# Patient Record
Sex: Female | Born: 1945
Health system: Southern US, Community
[De-identification: ages and names within clinical notes are randomized; demographics above are authoritative.]

## PROBLEM LIST (undated history)

## (undated) DIAGNOSIS — I639 Cerebral infarction, unspecified: Secondary | ICD-10-CM

## (undated) DIAGNOSIS — H409 Unspecified glaucoma: Secondary | ICD-10-CM

## (undated) DIAGNOSIS — I472 Ventricular tachycardia, unspecified: Secondary | ICD-10-CM

## (undated) DIAGNOSIS — I959 Hypotension, unspecified: Secondary | ICD-10-CM

## (undated) DIAGNOSIS — F32A Depression, unspecified: Secondary | ICD-10-CM

## (undated) DIAGNOSIS — R233 Spontaneous ecchymoses: Secondary | ICD-10-CM

## (undated) DIAGNOSIS — I499 Cardiac arrhythmia, unspecified: Secondary | ICD-10-CM

## (undated) DIAGNOSIS — E079 Disorder of thyroid, unspecified: Secondary | ICD-10-CM

## (undated) DIAGNOSIS — F419 Anxiety disorder, unspecified: Secondary | ICD-10-CM

## (undated) DIAGNOSIS — R251 Tremor, unspecified: Secondary | ICD-10-CM

## (undated) DIAGNOSIS — R238 Other skin changes: Secondary | ICD-10-CM

## (undated) DIAGNOSIS — F329 Major depressive disorder, single episode, unspecified: Secondary | ICD-10-CM

## (undated) DIAGNOSIS — G2581 Restless legs syndrome: Secondary | ICD-10-CM

## (undated) DIAGNOSIS — E039 Hypothyroidism, unspecified: Secondary | ICD-10-CM

## (undated) DIAGNOSIS — Z9889 Other specified postprocedural states: Secondary | ICD-10-CM

## (undated) DIAGNOSIS — I4891 Unspecified atrial fibrillation: Secondary | ICD-10-CM

## (undated) HISTORY — DX: Restless legs syndrome: G25.81

## (undated) HISTORY — DX: Other skin changes: R23.8

## (undated) HISTORY — DX: Ventricular tachycardia, unspecified: I47.20

## (undated) HISTORY — DX: Cardiac arrhythmia, unspecified: I49.9

## (undated) HISTORY — DX: Depression, unspecified: F32.A

## (undated) HISTORY — DX: Cerebral infarction, unspecified: I63.9

## (undated) HISTORY — PX: VAGINAL HYSTERECTOMY: SUR661

## (undated) HISTORY — DX: Anxiety disorder, unspecified: F41.9

## (undated) HISTORY — DX: Disorder of thyroid, unspecified: E07.9

## (undated) HISTORY — DX: Unspecified glaucoma: H40.9

## (undated) HISTORY — PX: FRACTURE SURGERY: SHX138

## (undated) HISTORY — DX: Spontaneous ecchymoses: R23.3

## (undated) HISTORY — DX: Major depressive disorder, single episode, unspecified: F32.9

## (undated) HISTORY — DX: Tremor, unspecified: R25.1

## (undated) HISTORY — DX: Hypotension, unspecified: I95.9

## (undated) HISTORY — DX: Unspecified atrial fibrillation: I48.91

## (undated) HISTORY — PX: EYE SURGERY: SHX253

## (undated) HISTORY — PX: TONSILLECTOMY: SUR1361

## (undated) HISTORY — DX: Ventricular tachycardia: I47.2

---

## 2007-02-01 ENCOUNTER — Encounter: Payer: Self-pay | Admitting: Cardiovascular Disease

## 2007-12-02 DIAGNOSIS — I639 Cerebral infarction, unspecified: Secondary | ICD-10-CM

## 2007-12-02 HISTORY — DX: Cerebral infarction, unspecified: I63.9

## 2007-12-25 ENCOUNTER — Encounter: Payer: Self-pay | Admitting: Cardiovascular Disease

## 2008-09-13 ENCOUNTER — Encounter: Payer: Self-pay | Admitting: Cardiovascular Disease

## 2008-09-27 ENCOUNTER — Encounter: Payer: Self-pay | Admitting: Cardiovascular Disease

## 2008-12-12 ENCOUNTER — Encounter: Payer: Self-pay | Admitting: Cardiovascular Disease

## 2009-06-12 ENCOUNTER — Encounter: Payer: Self-pay | Admitting: Family Medicine

## 2009-06-14 ENCOUNTER — Encounter: Payer: Self-pay | Admitting: Cardiovascular Disease

## 2009-10-17 ENCOUNTER — Encounter: Payer: Self-pay | Admitting: Family Medicine

## 2010-01-15 ENCOUNTER — Encounter: Payer: Self-pay | Admitting: Family Medicine

## 2010-03-03 ENCOUNTER — Encounter: Payer: Self-pay | Admitting: Cardiovascular Disease

## 2010-03-11 ENCOUNTER — Encounter: Payer: Self-pay | Admitting: Cardiovascular Disease

## 2010-03-11 ENCOUNTER — Ambulatory Visit: Payer: Self-pay | Admitting: Cardiovascular Disease

## 2010-03-11 ENCOUNTER — Encounter: Payer: Self-pay | Admitting: *Deleted

## 2010-03-11 DIAGNOSIS — R5383 Other fatigue: Secondary | ICD-10-CM

## 2010-03-11 DIAGNOSIS — R42 Dizziness and giddiness: Secondary | ICD-10-CM | POA: Insufficient documentation

## 2010-03-11 DIAGNOSIS — R002 Palpitations: Secondary | ICD-10-CM

## 2010-03-11 DIAGNOSIS — R5381 Other malaise: Secondary | ICD-10-CM | POA: Insufficient documentation

## 2010-03-11 DIAGNOSIS — I480 Paroxysmal atrial fibrillation: Secondary | ICD-10-CM | POA: Insufficient documentation

## 2010-03-11 DIAGNOSIS — I4819 Other persistent atrial fibrillation: Secondary | ICD-10-CM | POA: Insufficient documentation

## 2010-03-11 HISTORY — DX: Other malaise: R53.83

## 2010-03-28 LAB — CONVERTED CEMR LAB
HDL: 128 mg/dL
LDL Cholesterol: 114 mg/dL

## 2010-04-09 ENCOUNTER — Ambulatory Visit: Payer: Self-pay | Admitting: Cardiovascular Disease

## 2010-04-10 ENCOUNTER — Ambulatory Visit: Payer: Self-pay | Admitting: Family Medicine

## 2010-04-10 DIAGNOSIS — F341 Dysthymic disorder: Secondary | ICD-10-CM | POA: Insufficient documentation

## 2010-04-10 DIAGNOSIS — I669 Occlusion and stenosis of unspecified cerebral artery: Secondary | ICD-10-CM

## 2010-04-10 DIAGNOSIS — E039 Hypothyroidism, unspecified: Secondary | ICD-10-CM | POA: Insufficient documentation

## 2010-04-10 HISTORY — DX: Occlusion and stenosis of unspecified cerebral artery: I66.9

## 2010-04-10 HISTORY — DX: Dysthymic disorder: F34.1

## 2010-05-07 ENCOUNTER — Ambulatory Visit: Payer: Self-pay | Admitting: Cardiovascular Disease

## 2010-05-07 LAB — CONVERTED CEMR LAB: POC INR: 2.6

## 2010-05-16 ENCOUNTER — Telehealth: Payer: Self-pay | Admitting: Family Medicine

## 2010-05-16 ENCOUNTER — Ambulatory Visit: Payer: Self-pay | Admitting: Family Medicine

## 2010-05-16 DIAGNOSIS — IMO0001 Reserved for inherently not codable concepts without codable children: Secondary | ICD-10-CM

## 2010-05-16 LAB — CONVERTED CEMR LAB
Cholesterol, target level: 200 mg/dL
LDL Goal: 160 mg/dL

## 2010-05-19 ENCOUNTER — Encounter: Payer: Self-pay | Admitting: Family Medicine

## 2010-05-20 ENCOUNTER — Telehealth: Payer: Self-pay | Admitting: Family Medicine

## 2010-05-23 ENCOUNTER — Ambulatory Visit: Payer: Self-pay | Admitting: Family Medicine

## 2010-05-24 ENCOUNTER — Encounter: Payer: Self-pay | Admitting: Family Medicine

## 2010-05-26 LAB — CONVERTED CEMR LAB
Basophils Absolute: 0 10*3/uL (ref 0.0–0.1)
Calcium: 9.4 mg/dL (ref 8.4–10.5)
Eosinophils Absolute: 0.2 10*3/uL (ref 0.0–0.7)
GFR calc non Af Amer: 85.63 mL/min (ref >60)
Lymphocytes Relative: 37.8 % (ref 12.0–46.0)
MCHC: 34 g/dL (ref 30.0–36.0)
Mono Screen: NEGATIVE
Neutrophils Relative %: 44 % (ref 43.0–77.0)
Platelets: 260 10*3/uL (ref 150.0–400.0)
Potassium: 4.8 meq/L (ref 3.5–5.1)
RBC: 4.49 M/uL (ref 3.87–5.11)
RDW: 13.9 % (ref 11.5–14.6)
Sodium: 137 meq/L (ref 135–145)
TSH: 1.02 microintl units/mL (ref 0.35–5.50)

## 2010-05-30 ENCOUNTER — Telehealth: Payer: Self-pay | Admitting: Family Medicine

## 2010-06-02 ENCOUNTER — Ambulatory Visit: Payer: Self-pay | Admitting: Family Medicine

## 2010-06-02 DIAGNOSIS — N390 Urinary tract infection, site not specified: Secondary | ICD-10-CM

## 2010-06-02 LAB — CONVERTED CEMR LAB
Bilirubin Urine: NEGATIVE
Glucose, Urine, Semiquant: NEGATIVE
WBC Urine, dipstick: NEGATIVE
pH: 6

## 2010-06-03 ENCOUNTER — Encounter: Payer: Self-pay | Admitting: Family Medicine

## 2010-06-03 LAB — CONVERTED CEMR LAB: Prothrombin Time: 61.7 s (ref 9.7–11.8)

## 2010-06-06 ENCOUNTER — Encounter: Payer: Self-pay | Admitting: Family Medicine

## 2010-06-06 ENCOUNTER — Ambulatory Visit: Payer: Self-pay | Admitting: Family Medicine

## 2010-06-06 DIAGNOSIS — R3129 Other microscopic hematuria: Secondary | ICD-10-CM

## 2010-06-06 LAB — CONVERTED CEMR LAB
Bilirubin Urine: NEGATIVE
Protein, U semiquant: NEGATIVE
Urobilinogen, UA: 0.2
WBC Urine, dipstick: NEGATIVE
pH: 7.5

## 2010-06-07 ENCOUNTER — Encounter: Payer: Self-pay | Admitting: Family Medicine

## 2010-06-09 ENCOUNTER — Encounter: Payer: Self-pay | Admitting: Family Medicine

## 2010-06-13 ENCOUNTER — Telehealth (INDEPENDENT_AMBULATORY_CARE_PROVIDER_SITE_OTHER): Payer: Self-pay | Admitting: *Deleted

## 2010-06-13 ENCOUNTER — Ambulatory Visit: Payer: Self-pay | Admitting: Family Medicine

## 2010-06-13 LAB — CONVERTED CEMR LAB: Prothrombin Time: 28.9 s

## 2010-06-16 ENCOUNTER — Ambulatory Visit: Payer: Self-pay | Admitting: Family Medicine

## 2010-06-16 DIAGNOSIS — R509 Fever, unspecified: Secondary | ICD-10-CM

## 2010-06-20 ENCOUNTER — Ambulatory Visit: Payer: Self-pay | Admitting: Family Medicine

## 2010-06-23 LAB — CONVERTED CEMR LAB
Folate: 13.9 ng/mL
Iron: 59 ug/dL (ref 42–145)
Sed Rate: 10 mm/hr (ref 0–22)
Vitamin B-12: 262 pg/mL (ref 211–911)

## 2010-06-30 ENCOUNTER — Ambulatory Visit: Payer: Self-pay | Admitting: Family Medicine

## 2010-06-30 LAB — CONVERTED CEMR LAB: Prothrombin Time: 33 s

## 2010-07-01 ENCOUNTER — Encounter: Payer: Self-pay | Admitting: Family Medicine

## 2010-07-01 ENCOUNTER — Telehealth: Payer: Self-pay | Admitting: Family Medicine

## 2010-07-18 ENCOUNTER — Encounter: Payer: Self-pay | Admitting: Family Medicine

## 2010-07-23 ENCOUNTER — Ambulatory Visit: Payer: Self-pay | Admitting: Neurology

## 2010-07-23 ENCOUNTER — Encounter: Payer: Self-pay | Admitting: Cardiovascular Disease

## 2010-07-29 ENCOUNTER — Ambulatory Visit: Payer: Self-pay | Admitting: Family Medicine

## 2010-07-29 LAB — CONVERTED CEMR LAB
INR: 2
Prothrombin Time: 23.6 s

## 2010-08-11 ENCOUNTER — Encounter: Payer: Self-pay | Admitting: Family Medicine

## 2010-08-20 ENCOUNTER — Telehealth: Payer: Self-pay | Admitting: Family Medicine

## 2010-08-26 ENCOUNTER — Ambulatory Visit
Admission: RE | Admit: 2010-08-26 | Discharge: 2010-08-26 | Payer: Self-pay | Source: Home / Self Care | Attending: Family Medicine | Admitting: Family Medicine

## 2010-08-26 LAB — CONVERTED CEMR LAB: Prothrombin Time: 27.7 s

## 2010-09-02 NOTE — Progress Notes (Signed)
Summary: Question about Warfarin  Phone Note From Pharmacy Call back at [phone] and fax [phone]   Caller: [facility] Call For: Dr Dayton Martes  Summary of Call: received faxed request from [facility] for Warfarin Sodium 5mg  with insturctions to take one tablet every day. I did not see this on pt's med list. Form is on your desk in the in box. Thank you. Initial call taken by: Lewanda Rife LPN,  July 01, 2010 11:21 AM  Follow-up for Phone Call        signed, on my desk. Ruthe Mannan MD  July 01, 2010 11:31 AM   Additional Follow-up for Phone Call Additional follow up Details #1::        [facility] faxed to [phone].Lewanda Rife LPN  July 01, 2010 11:57 AM

## 2010-09-02 NOTE — Assessment & Plan Note (Signed)
Summary: DISCUSS SOME CONCERNS,FLU SHOT/NT   Vital Signs:  Patient profile:   65 year old female Height:      69 inches Weight:      189 pounds BMI:     28.01 Temp:     97.7 degrees F oral Pulse rate:   68 / minute Pulse rhythm:   regular BP sitting:   110 / 78  (right arm) Cuff size:   regular  Vitals Entered By: Linde Gillis CMA Duncan Dull) (June 02, 2010 2:23 PM) CC: discuss concerns, skin feels hot   History of Present Illness: 65 yo here for follow up of myalgias. Some improvement. No longer having body aches or chills. Finished cipro for UTI.  CBC, CK, LFTs, TSH within normal limits.    No nausea or vomiting. No muscles weakness. Afebrile, no changes in medications.  No new symptoms, tylenol helps with pain.  Afib- wants her INR followed up here instead of at cardiology.  Current Medications (verified): 1)  Prozac 40 Mg Caps (Fluoxetine Hcl) .... Once Daily 2)  Wellbutrin Xl 300 Mg Xr24h-Tab (Bupropion Hcl) .Marland Kitchen.. 1 1/2 Tab Daily 3)  Diltiazem Hcl 120 Mg Tabs (Diltiazem Hcl) .... Once Daily 4)  Coumadin 2.5 Mg Tabs (Warfarin Sodium) .... As Directed 5)  Flecainide Acetate 150 Mg Tabs (Flecainide Acetate) .... 1/2 Tab Two Times A Day 6)  Synthroid 25 Mcg Tabs (Levothyroxine Sodium) .... Once Daily 7)  Dextroamphetamine Sulfate 5 Mg Tabs (Dextroamphetamine Sulfate) .... Once Daily 8)  Klonopin 1 Mg Tabs (Clonazepam) .... 2.5 Mg Once Daily 9)  Vitamin D 1000 Unit  Tabs (Cholecalciferol) .... Take 1 Tablet By Mouth Once A Day 10)  Glucosamine-Chondroitin  Caps (Glucosamine-Chondroit-Vit C-Mn) .Marland Kitchen.. 1-2 Daily As Needed 11)  Calcium 600 Mg Tabs (Calcium) .... One Tablet By Mouth Twice A Day 12)  Multivitamins   Tabs (Multiple Vitamin) .... Take 1 Tablet By Mouth Once A Day 13)  Vitamin E ?mg .... Take 1 Capsule By Mouth Once A Day 14)  Folic Acid ?mg .... Take 1 Tablet By Mouth Once A Day 15)  Aspirin 81 Mg  Tabs (Aspirin) .... Take 1 Tablet By Mouth Once A  Day  Allergies: 1)  ! Levaquin (Levofloxacin) 2)  ! Darvocet  Past History:  Past Medical History: Last updated: 05/16/2010 Anxiety Depression Ventricular Tachycardia CVA 12/2007  Past Surgical History: Last updated: 04-18-2010 Hysterecotomy Tonsillectomy  Family History: Last updated: 04-18-10 dad had MS Mom died at 67 in MVA no children  Social History: Last updated: 05/16/2010 Retired  Married  Tobacco Use - No.  Alcohol Use - no Regular Exercise - yes Lives in [place] with her husband, no children. [employer]  Risk Factors: Exercise: yes (03/11/2010)  Risk Factors: Smoking Status: never (03/11/2010)  Review of Systems      See HPI General:  Complains of malaise; denies loss of appetite. Resp:  Denies cough and shortness of breath. GI:  Denies abdominal pain, loss of appetite, nausea, and vomiting. GU:  Denies dysuria, hematuria, incontinence, urinary frequency, and urinary hesitancy.  Physical Exam  General:  GEN: nad, alert and oriented HEENT: mucous membranes moist, TM wnl bilateral, nasal exam wnl NECK: supple w/o LA CV: rrr.  no murmur PULM: ctab, no inc wob ABD: soft, +bs EXT: no edema SKIN: no acute rash    Impression & Recommendations:  Problem # 1:  MYALGIA (ICD-729.1) Assessment Improved UA still positive for microscopic hematuria. May be due to coumadin use.  Will send for culture, if no bacteria, refer to urology. Unclear etiology for her myalgias but since improving, hopefully infectious. Her updated medication list for this problem includes:    Aspirin 81 Mg Tabs (Aspirin) .Marland Kitchen... Take 1 tablet by mouth once a day  Complete Medication List: 1)  Prozac 40 Mg Caps (Fluoxetine hcl) .... Once daily 2)  Wellbutrin Xl 300 Mg Xr24h-tab (Bupropion hcl) .Marland Kitchen.. 1 1/2 tab daily 3)  Diltiazem Hcl 120 Mg Tabs (Diltiazem hcl) .... Once daily 4)  Coumadin 2.5 Mg Tabs (Warfarin sodium) .... As directed 5)  Flecainide Acetate  150 Mg Tabs (Flecainide acetate) .... 1/2 tab two times a day 6)  Synthroid 25 Mcg Tabs (Levothyroxine sodium) .... Once daily 7)  Dextroamphetamine Sulfate 5 Mg Tabs (Dextroamphetamine sulfate) .... Once daily 8)  Klonopin 1 Mg Tabs (Clonazepam) .... 2.5 mg once daily 9)  Vitamin D 1000 Unit Tabs (Cholecalciferol) .... Take 1 tablet by mouth once a day 10)  Glucosamine-chondroitin Caps (Glucosamine-chondroit-vit c-mn) .Marland Kitchen.. 1-2 daily as needed 11)  Calcium 600 Mg Tabs (Calcium) .... One tablet by mouth twice a day 12)  Multivitamins Tabs (Multiple vitamin) .... Take 1 tablet by mouth once a day 13)  Vitamin E ?mg  .... Take 1 capsule by mouth once a day 14)  Folic Acid ?mg  .... Take 1 tablet by mouth once a day 15)  Aspirin 81 Mg Tabs (Aspirin) .... Take 1 tablet by mouth once a day  Other Orders: Venipuncture (16109) TLB-PT (Protime) (85610-PTP) TLB-PTT (85730-PTTL) UA Dipstick w/o Micro (manual) (60454) Admin 1st Vaccine (09811) Flu Vaccine 50yrs + (91478) T-Culture, Urine (29562-13086)   Orders Added: 1)  Venipuncture [57846] 2)  TLB-PT (Protime) [85610-PTP] 3)  TLB-PTT [85730-PTTL] 4)  UA Dipstick w/o Micro (manual) [81002] 5)  Admin 1st Vaccine [90471] 6)  Flu Vaccine 51yrs + [96295] 7)  Est. Patient Level IV [28413] 8)  T-Culture, Urine [24401-02725]    Current Allergies (reviewed today): ! LEVAQUIN (LEVOFLOXACIN) ! DARVOCET  Laboratory Results   Urine Tests  Date/Time Received: June 02, 2010 2:45 PM   Routine Urinalysis   Color: yellow Appearance: Clear Glucose: negative   (Normal Range: Negative) Bilirubin: negative   (Normal Range: Negative) Ketone: negative   (Normal Range: Negative) Spec. Gravity: 1.015   (Normal Range: 1.003-1.035) Blood: negative   (Normal Range: Negative) pH: 6.0   (Normal Range: 5.0-8.0) Protein: trace   (Normal Range: Negative) Urobilinogen: 0.2   (Normal Range: 0-1) Nitrite: negative   (Normal Range: Negative) Leukocyte  Esterace: negative   (Normal Range: Negative)       Flu Vaccine Consent Questions     Do you have a history of severe allergic reactions to this vaccine? no    Any prior history of allergic reactions to egg and/or gelatin? no    Do you have a sensitivity to the preservative Thimersol? no    Do you have a past history of Guillan-Barre Syndrome? no    Do you currently have an acute febrile illness? no    Have you ever had a severe reaction to latex? no    Vaccine information given and explained to patient? yes    Are you currently pregnant? no    Lot Number:[id number]   Exp Date:01/31/2011   Site Given  Left Deltoid IM

## 2010-09-02 NOTE — Progress Notes (Signed)
Summary: Letter from patient  Phone Note Call from Patient Call back at Home Phone [phone]   Caller: Patient Call For: Ruthe Mannan MD Summary of Call: Patient wrote a letter and wanted [name] to see it.  Letter in your IN box.   Initial call taken by: Linde Gillis CMA Duncan Dull),  May 20, 2010 8:26 AM  Follow-up for Phone Call        read letter, needs to be seen. (30 min appt). Ruthe Mannan MD  May 20, 2010 8:47 AM  Patient notified as instructed via telephone.  30 min appt scheduled for 05/23/2010 at 12:00.  Follow-up by: Linde Gillis CMA Duncan Dull),  May 20, 2010 9:02 AM

## 2010-09-02 NOTE — Assessment & Plan Note (Signed)
Summary: Warfarin 5mg  med list and refill update.   Current Allergies: ! LEVAQUIN (LEVOFLOXACIN) ! DARVOCETPrescriptions: WARFARIN SODIUM 5 MG TABS (WARFARIN SODIUM) Take 1 tablet by mouth once a day  #60 x 6   Entered by:   Lewanda Rife LPN   Authorized by:   Ruthe Mannan MD   Signed by:   Lewanda Rife LPN on 40/98/1191   Method used:   Historical   RxID:   [id number]  Lyla Son had already faxed form to [facility]. I entered the med as a historical refill so it would be in pt's chart that the refill was done and when I went to end the update it would not let me sign. sorry. Lewanda Rife LPN  July 01, 2010 12:01 PM

## 2010-09-02 NOTE — Consult Note (Signed)
Summary: [facility]  [facility]   Imported By: Lanelle Bal 06/25/2010 10:17:19  _____________________________________________________________________  External Attachment:    Type:   Image     Comment:   External Document

## 2010-09-02 NOTE — Miscellaneous (Signed)
Summary: Orders Update  Clinical Lists Changes  Orders: Added new Referral order of Neurology Referral (Neuro) - Signed 

## 2010-09-02 NOTE — Letter (Signed)
Summary: External Correspondence  External Correspondence   Imported By: West Carbo 03/03/2010 09:57:35  _____________________________________________________________________  External Attachment:    Type:   Image     Comment:   External Document

## 2010-09-02 NOTE — Letter (Signed)
Summary: PHI  PHI   Imported By: Harlon Flor 03/12/2010 15:45:48  _____________________________________________________________________  External Attachment:    Type:   Image     Comment:   External Document

## 2010-09-02 NOTE — Letter (Signed)
Summary: Date Range:04-04-08 to 10-17-09/[facility]  Date Range:04-04-08 to 10-17-09/[facility]   Imported By: Sherian Rein 04/18/2010 07:34:00  _____________________________________________________________________  External Attachment:    Type:   Image     Comment:   External Document

## 2010-09-02 NOTE — Assessment & Plan Note (Signed)
Summary: NOT FEELING WELL/ 11:15   Vital Signs:  Patient profile:   66 year old female Height:      69 inches Weight:      188 pounds BMI:     27.86 Temp:     98.1 degrees F oral Pulse rate:   76 / minute Pulse rhythm:   regular BP sitting:   120 / 82  (right arm) Cuff size:   regular  Vitals Entered By: Linde Gillis CMA Duncan Dull) (June 20, 2010 10:46 AM) CC: not feeling well, tired, drained   History of Present Illness: 65 yo here for follow up of myalgias. Initially seen for this on 05/16/2010.  Some improvement but she is feeling more and more fatiged. Has a headache everyday.  Feels like her skin is warm to touch--burning up but afebrile.    Starting to have body aches again.     Treated for UTI with cipro, no additional urinary complaints.   CBC, CK, LFTs, TSH within normal limits.   RMSF, Lyme titer negative.  No nausea or vomiting. No muscles weakness.  No radiucolapthy. Afebrile, no changes in medications. Appetite is good but starting not wanting to go the gym, too tired.    s/p stroke so I have referred her back to neurology to see if they can help with these symptoms, has an appt in December.  Current Medications (verified): 1)  Prozac 40 Mg Caps (Fluoxetine Hcl) .... Once Daily 2)  Wellbutrin Xl 300 Mg Xr24h-Tab (Bupropion Hcl) .Marland Kitchen.. 1 1/2 Tab Daily 3)  Diltiazem Hcl 120 Mg Tabs (Diltiazem Hcl) .... Once Daily 4)  Coumadin 2.5 Mg Tabs (Warfarin Sodium) .... As Directed 5)  Flecainide Acetate 150 Mg Tabs (Flecainide Acetate) .... 1/2 Tab Two Times A Day 6)  Synthroid 25 Mcg Tabs (Levothyroxine Sodium) .... Once Daily 7)  Dextroamphetamine Sulfate 5 Mg Tabs (Dextroamphetamine Sulfate) .... Once Daily 8)  Klonopin 1 Mg Tabs (Clonazepam) .... 2.5 Mg Once Daily 9)  Vitamin D 1000 Unit  Tabs (Cholecalciferol) .... Take 1 Tablet By Mouth Once A Day 10)  Glucosamine-Chondroitin  Caps (Glucosamine-Chondroit-Vit C-Mn) .Marland Kitchen.. 1-2 Daily As Needed 11)  Calcium 600 Mg  Tabs (Calcium) .... One Tablet By Mouth Twice A Day 12)  Multivitamins   Tabs (Multiple Vitamin) .... Take 1 Tablet By Mouth Once A Day 13)  Vitamin E ?mg .... Take 1 Capsule By Mouth Once A Day 14)  Folic Acid ?mg .... Take 1 Tablet By Mouth Once A Day 15)  Aspirin 81 Mg  Tabs (Aspirin) .... Take 1 Tablet By Mouth Once A Day  Allergies: 1)  ! Levaquin (Levofloxacin) 2)  ! Darvocet  Review of Systems      See HPI General:  Complains of malaise; denies fever, loss of appetite, weakness, and weight loss. MS:  Complains of muscle aches; denies joint pain, joint redness, joint swelling, and loss of strength.  Physical Exam  General:  GEN: nad, alert and oriented HEENT: mucous membranes moist, TM wnl bilateral, nasal exam wnl NECK: supple w/o LA CV: rrr.  no murmur PULM: ctab, no inc wob ABD: soft, +bs EXT: no edema SKIN: no acute rash  Neurologic:  alert & oriented X3, cranial nerves II-XII intact, strength normal in all extremities, sensation intact to light touch, and DTRs symmetrical and normal.   Psych:  Normal affect.   Impression & Recommendations:  Problem # 1:  FATIGUE / MALAISE (ICD-780.79) Assessment Unchanged Given duration of symptoms an inability to find  infectious source (other than treated UTI), I am suspicious at this point for neurogical late CVA effects vs. rheum issue. TSH, CBC, BMET, RMSF, Lyme, Mono all negative. Redraw UA today, keep neuro appt.  Will draw additional labs today- B12/folate, ESR, RF.  Complete Medication List: 1)  Prozac 40 Mg Caps (Fluoxetine hcl) .... Once daily 2)  Wellbutrin Xl 300 Mg Xr24h-tab (Bupropion hcl) .Marland Kitchen.. 1 1/2 tab daily 3)  Diltiazem Hcl 120 Mg Tabs (Diltiazem hcl) .... Once daily 4)  Coumadin 2.5 Mg Tabs (Warfarin sodium) .... As directed 5)  Flecainide Acetate 150 Mg Tabs (Flecainide acetate) .... 1/2 tab two times a day 6)  Synthroid 25 Mcg Tabs (Levothyroxine sodium) .... Once daily 7)  Dextroamphetamine Sulfate 5 Mg  Tabs (Dextroamphetamine sulfate) .... Once daily 8)  Klonopin 1 Mg Tabs (Clonazepam) .... 2.5 mg once daily 9)  Vitamin D 1000 Unit Tabs (Cholecalciferol) .... Take 1 tablet by mouth once a day 10)  Glucosamine-chondroitin Caps (Glucosamine-chondroit-vit c-mn) .Marland Kitchen.. 1-2 daily as needed 11)  Calcium 600 Mg Tabs (Calcium) .... One tablet by mouth twice a day 12)  Multivitamins Tabs (Multiple vitamin) .... Take 1 tablet by mouth once a day 13)  Vitamin E ?mg  .... Take 1 capsule by mouth once a day 14)  Folic Acid ?mg  .... Take 1 tablet by mouth once a day 15)  Aspirin 81 Mg Tabs (Aspirin) .... Take 1 tablet by mouth once a day  Other Orders: Venipuncture (60454) TLB-B12 + Folate Pnl (09811_91478-G95/AOZ) TLB-IBC Pnl (Iron/FE;Transferrin) (83550-IBC) TLB-Sedimentation Rate (ESR) (85652-ESR) T-Rheumatoid Factor (30865-78469)   Orders Added: 1)  Venipuncture [36415] 2)  TLB-B12 + Folate Pnl [82746_82607-B12/FOL] 3)  TLB-IBC Pnl (Iron/FE;Transferrin) [83550-IBC] 4)  TLB-Sedimentation Rate (ESR) [85652-ESR] 5)  T-Rheumatoid Factor [62952-84132] 6)  New Patient Level IV [44010]    Current Allergies (reviewed today): ! LEVAQUIN (LEVOFLOXACIN) ! DARVOCET  Appended Document: NOT FEELING WELL/ 11:15     Allergies: 1)  ! Levaquin (Levofloxacin) 2)  ! Darvocet   Complete Medication List: 1)  Prozac 40 Mg Caps (Fluoxetine hcl) .... Once daily 2)  Wellbutrin Xl 300 Mg Xr24h-tab (Bupropion hcl) .Marland Kitchen.. 1 1/2 tab daily 3)  Diltiazem Hcl 120 Mg Tabs (Diltiazem hcl) .... Once daily 4)  Coumadin 2.5 Mg Tabs (Warfarin sodium) .... As directed 5)  Flecainide Acetate 150 Mg Tabs (Flecainide acetate) .... 1/2 tab two times a day 6)  Synthroid 25 Mcg Tabs (Levothyroxine sodium) .... Once daily 7)  Dextroamphetamine Sulfate 5 Mg Tabs (Dextroamphetamine sulfate) .... Once daily 8)  Klonopin 1 Mg Tabs (Clonazepam) .... 2.5 mg once daily 9)  Vitamin D 1000 Unit Tabs (Cholecalciferol) .... Take 1  tablet by mouth once a day 10)  Glucosamine-chondroitin Caps (Glucosamine-chondroit-vit c-mn) .Marland Kitchen.. 1-2 daily as needed 11)  Calcium 600 Mg Tabs (Calcium) .... One tablet by mouth twice a day 12)  Multivitamins Tabs (Multiple vitamin) .... Take 1 tablet by mouth once a day 13)  Vitamin E ?mg  .... Take 1 capsule by mouth once a day 14)  Folic Acid ?mg  .... Take 1 tablet by mouth once a day 15)  Aspirin 81 Mg Tabs (Aspirin) .... Take 1 tablet by mouth once a day  Other Orders: Specimen Handling (27253)   Orders Added: 1)  Specimen Handling [99000]     Laboratory Results   Urine Tests  Date/Time Received: June 20, 2010 11:24 AM   Routine Urinalysis   Color: orange Appearance: Clear Glucose: negative   (  Normal Range: Negative) Bilirubin: negative   (Normal Range: Negative) Ketone: negative   (Normal Range: Negative) Spec. Gravity: 1.010   (Normal Range: 1.003-1.035) Blood: trace-intact   (Normal Range: Negative) pH: 6.5   (Normal Range: 5.0-8.0) Protein: trace   (Normal Range: Negative) Urobilinogen: 0.2   (Normal Range: 0-1) Nitrite: negative   (Normal Range: Negative) Leukocyte Esterace: negative   (Normal Range: Negative)      Please let pt know that UA in neg except for hematuria, needs to keep appt with urology.  Patient advised via message left on home machine.  Linde Gillis CMA Duncan Dull)  June 20, 2010 11:34 AM   Appended Document: NOT FEELING WELL/ 11:15 Mrs. Meiners stated that the urologist told her she didn't have to come back to see him because her urine was clear in the office at her visit.  Should she go back to see the urologist or should she get another round of antibiotics?  Please advise.  Appended Document: NOT FEELING WELL/ 11:15 Just reviewed urology notes.  I did not realize she had already seen them. Per urology notes, did not need to repeat UA until 4-6 weeks after her last one so it would not be unusual to still have a little bit of blood  that is transient.  Have her return in 2-4 weeks and we will repeat at that time.  Appended Document: NOT FEELING WELL/ 11:15 Patient advised as instructed via message left on machine at home number.

## 2010-09-02 NOTE — Progress Notes (Signed)
  Phone Note Call from Patient   Caller: Patient Call For: Ruthe Mannan MD Summary of Call: Patient wants to be tested for tick borne disease, she had a tick bite x 4 months ago and thought that may be why she is having fevers, etc. Initial call taken by: Mills Koller,  June 13, 2010 9:43 AM  Follow-up for Phone Call        [facility] to check for Lyme and RMSF. Ruthe Mannan MD  June 13, 2010 10:09 AM   Additional Follow-up for Phone Call Additional follow up Details #1::        lmom for patient t schedule lab appt. waiting for call back from patient Additional Follow-up by: Mills Koller,  June 13, 2010 10:45 AM    Additional Follow-up for Phone Call Additional follow up Details #2::    patient scheduled for today. Follow-up by: Mills Koller,  June 16, 2010 8:46 AM

## 2010-09-02 NOTE — Assessment & Plan Note (Signed)
Summary: ACHY   Vital Signs:  Patient profile:   65 year old female Height:      69 inches Weight:      187.25 pounds BMI:     27.75 Temp:     98.3 degrees F oral Pulse rate:   64 / minute Pulse rhythm:   regular BP sitting:   100 / 74  (left arm) Cuff size:   regular  Vitals Entered By: Lewanda Rife LPN (May 16, 2010 9:34 AM) CC: h/a, achy in arms, shoulder and back in afternoon for 1 week, Lipid Management   History of Present Illness: For last week, notied symptoms in afternoon.  Aching from head to waist in afternoon.  Feels like patient has a fever, but not febrile when checked.  More fatigued in afternoon.  Taking tylenol for symptoms in afternoon and at night.  No ST, ear pain, NAVD, cough.  No chills.  Had brief ST about  1 week ago, resolved.  No other complaints.   Lipid Management History:      Positive NCEP/ATP III risk factors include female age 80 years old or older.  Negative NCEP/ATP III risk factors include HDL cholesterol greater than 60 and non-tobacco-user status.     Allergies: 1)  ! Levaquin (Levofloxacin) 2)  ! Darvocet  Past History:  Past Medical History: Anxiety Depression Ventricular Tachycardia CVA 12/2007  Social History: Retired  Married  Tobacco Use - No.  Alcohol Use - no Regular Exercise - yes Lives in [place] with her husband, no children. [employer]  Review of Systems       See HPI.  Otherwise negative.    Physical Exam  General:  GEN: nad, alert and oriented HEENT: mucous membranes moist, TM wnl bilateral, nasal exam wnl NECK: supple w/o LA CV: rrr.  no murmur PULM: ctab, no inc wob ABD: soft, +bs EXT: no edema SKIN: no acute rash    Patient Instructions: 1)  I'll talk to [name].  Let me know if you symptoms continue.  I think this will likely resolve itself.  Take care.  Prescriptions: DEXTROAMPHETAMINE SULFATE 5 MG TABS (DEXTROAMPHETAMINE SULFATE) once daily  #30 x 0   Entered and Authorized by:    Crawford Givens MD   Signed by:   Crawford Givens MD on 05/16/2010   Method used:   Print then Give to Patient   RxID:   [id number] DEXTROAMPHETAMINE SULFATE 5 MG TABS (DEXTROAMPHETAMINE SULFATE) once daily  #30 x 0   Entered and Authorized by:   Crawford Givens MD   Signed by:   Crawford Givens MD on 05/16/2010   Method used:   Print then Give to Patient   RxID:   [id number] DEXTROAMPHETAMINE SULFATE 5 MG TABS (DEXTROAMPHETAMINE SULFATE) once daily  #30 x 0   Entered and Authorized by:   Crawford Givens MD   Signed by:   Crawford Givens MD on 05/16/2010   Method used:   Print then Give to Patient   RxID:   [id number]   Current Allergies (reviewed today): ! LEVAQUIN (LEVOFLOXACIN) ! DARVOCET

## 2010-09-02 NOTE — Assessment & Plan Note (Signed)
Summary: U/A & update on her condition  Nurse Visit   Allergies: 1)  ! Levaquin (Levofloxacin) 2)  ! Darvocet Laboratory Results   Urine Tests  Date/Time Received: June 06, 2010 10:00 AM   Routine Urinalysis   Color: yellow Appearance: Clear Glucose: negative   (Normal Range: Negative) Bilirubin: negative   (Normal Range: Negative) Ketone: negative   (Normal Range: Negative) Spec. Gravity: 1.015   (Normal Range: 1.003-1.035) Blood: trace-lysed   (Normal Range: Negative) pH: 7.5   (Normal Range: 5.0-8.0) Protein: negative   (Normal Range: Negative) Urobilinogen: 0.2   (Normal Range: 0-1) Nitrite: negative   (Normal Range: Negative) Leukocyte Esterace: negative   (Normal Range: Negative)       Orders Added: 1)  Urology Referral [Urology] 2)  T-Culture, Urine [[id number]]  Patient states that she still has pain and body aches every afternoon.  This has been going on for about four weeks or more now.  She states that her temperature has been between 98.6 and 99.0.  Her normal temp is 97.6.  Please advise.  Linde Gillis CMA Duncan Dull)  June 06, 2010 10:02 AM   I am honestly not sure what else we can do about her pain and body aches, perhaps refer to neuro if we have to.  She is still having blood in her urine, so as discussed, I will refer to urology for further work up.  Not sure if this is related to her body aches. Ruthe Mannan MD  June 06, 2010 10:18 AM  Patient advised as instructed via message left on machine at home.  Advised patient to call us back if she is willing to see a Neurologist for further evaluation.  Linde Gillis CMA Duncan Dull)  June 06, 2010 11:44 AM

## 2010-09-02 NOTE — Medication Information (Signed)
Summary: [facility]  Anticoagulant Therapy  Managed by: Cloyde Reams, RN, BSN Referring MD: Cherlynn Polo MD: Mariah Milling Indication 1: CVA INR POC 2.0 INR RANGE 2-3  Dietary changes: no     Bleeding/hemorrhagic complications: no     Any changes in medication regimen? no     Any missed doses?: no       Is patient compliant with meds? yes       Allergies: 1)  ! Levaquin (Levofloxacin) 2)  ! Darvocet  Anticoagulation Management History:      The patient comes in today for her initial visit for anticoagulation therapy.  Negative risk factors for bleeding include an age less than 69 years old.  The bleeding index is 'low risk'.  Negative CHADS2 values include Age > 39 years old.  Anticoagulation responsible provider: [facility].  INR POC: 2.0.  Cuvette Lot#: [id number].  Exp: 05/2011.    Anticoagulation Management Assessment/Plan:      The patient's current anticoagulation dose is Coumadin 2.5 mg tabs: as directed.  The next INR is due 04/08/2010.  Results were reviewed/authorized by Cloyde Reams, RN, BSN.  She was notified by Benedict Needy, RN.         Current Anticoagulation Instructions: INR 2.0  Continue taking 1 tab daily. Recheck in 4 weeks.

## 2010-09-02 NOTE — Progress Notes (Signed)
Summary: Dextroamphetamine sulfate 5mg  rx  Phone Note Outgoing Call   Call placed by: Lewanda Rife LPN Call placed to: Patient Summary of Call: Dr Para March asked me to contact pt and let her know that he spoke with Dr Dayton Martes and  the 3 prescriptions forDextroamphetamine sulfate 5mg  are at the front desk for pick up. Patient notified as instructed by telephone. Lewanda Rife LPN  May 16, 2010 2:28 PM

## 2010-09-02 NOTE — Assessment & Plan Note (Signed)
Summary: NEW PT   Visit Type:  NEW PT Primary Provider:  [name]  CC:  no complaints.  History of Present Illness: Monica Whitaker is a 64 year old woman with a history of atrial fibrillation over the past several years, stroke in May 2009 after medical cardioversion to sinus rhythm who has been on chronic warfarin therapy and presents to establish care.  She reports that she has been in atrial fibrillation for a short time, less than 24 hours, and she was started on a medication. This caused a mild stroke and she continues to have memory problems, or finding difficulty. She is otherwise in relatively good health. She does aerobic activity 3 times per week, has occasional swelling in her ankles, very rarely has atrial fibrillation breakthrough and when she does she takes diltiazem CD 120 mg extra.  EKG shows normal sinus rhythm with rate of 63 beats per minute, no significant ST or T wave changes.  Preventive Screening-Counseling & Management  Alcohol-Tobacco     Smoking Status: never  Caffeine-Diet-Exercise     Does Patient Exercise: yes  Allergies: 1)  ! Levaquin (Levofloxacin) 2)  ! Darvocet  Past History:  Family History: Last updated: 03/11/2010 no family history  Social History: Last updated: 03/11/2010 Retired  Married  Tobacco Use - No.  Alcohol Use - no Regular Exercise - yes  Risk Factors: Exercise: yes (03/11/2010)  Risk Factors: Smoking Status: never (03/11/2010)  Past Surgical History: Hysterecotomy  Family History: no family history  Social History: Retired  Married  Tobacco Use - No.  Alcohol Use - no Regular Exercise - yes Smoking Status:  never Does Patient Exercise:  yes  Review of Systems  The patient denies fever, weight loss, weight gain, vision loss, decreased hearing, hoarseness, chest pain, syncope, dyspnea on exertion, peripheral edema, prolonged cough, abdominal pain, incontinence, muscle weakness, depression, and enlarged lymph  nodes.    Vital Signs:  Patient profile:   65 year old female Height:      69 inches Weight:      185 pounds BMI:     27.42 Pulse rate:   67 / minute BP sitting:   119 / 73  (left arm) Cuff size:   regular  Vitals Entered By: Hardin Negus, RMA (March 11, 2010 11:25 AM)  Physical Exam  General:  Well developed, well nourished, in no acute distress. Head:  normocephalic and atraumatic Neck:  Neck supple, no JVD. No masses, thyromegaly or abnormal cervical nodes. Lungs:  Clear bilaterally to auscultation and percussion. Heart:  Non-displaced PMI, chest non-tender; regular rate and rhythm, S1, S2 without murmurs, rubs or gallops. Carotid upstroke normal, no bruit. . Pedals normal pulses. No edema, no varicosities. Abdomen:  Bowel sounds positive; abdomen soft and non-tender without masses, Msk:  Back normal, normal gait. Muscle strength and tone normal. Pulses:  pulses normal in all 4 extremities Extremities:  No clubbing or cyanosis. Neurologic:  Alert and oriented x 3. Skin:  Intact without lesions or rashes. Psych:  Normal affect.   Impression & Recommendations:  Problem # 1:  ATRIAL FIBRILLATION (ICD-427.31) history of atrial fibrillation, well controlled on flecainide. She does have breakthrough atrial fibrillation that is rare and she takes diltiazem CD. I suggested that she could take the short acting diltiazem 30 or 60 mg p.r.n. for breakthrough atrial fibrillation. She Current even take an extra dose of flecainide. we will check her INR today. She can check this at [facility] or with our office, which  ever she prefers.  Her updated medication list for this problem includes:    Coumadin 2.5 Mg Tabs (Warfarin sodium) .Marland Kitchen... As directed    Flecainide Acetate 150 Mg Tabs (Flecainide acetate) .Marland Kitchen... 1/2 tab two times a day  Problem # 2:  FATIGUE / MALAISE (ICD-780.79) she reports having a long history of fatigue. In the past she was on an SSRI and this was changed to  amphetamine which has helped her energy level. She is scheduled to see a new primary care physician at [facility] and has asked [place] to provide her with a bridge prescription for one month. We will write this for her with no refills.  Other Orders: EKG w/ Interpretation (93000)  Appended Document: NEW PT additional notes provided detail at she was diagnosed with RVOT VT in 19 9280 AO at which time she declined an electrophysiology study. She went to the emergency room in 2004 in [place] and was diagnosed with atrial fibrillation. Additional episodes of palpitations, diaphoresis, near syncope in 2008. Normal stress echo at that time. She reported a history of panic disorder at that time with difficulty sometimes distinguishing between her anxiety and her arrhythmia.  Echocardiogram in May 2009 shows normal systolic function, otherwise normal study.

## 2010-09-02 NOTE — Procedures (Signed)
Summary: Holter and Event  Holter and Event   Imported By: Harlon Flor 03/12/2010 15:39:37  _____________________________________________________________________  External Attachment:    Type:   Image     Comment:   External Document

## 2010-09-02 NOTE — Assessment & Plan Note (Signed)
Summary: 30 MIN APPT TO DISCUSS CONCERNS/NT   Vital Signs:  Patient profile:   65 year old female Height:      69 inches Weight:      186 pounds BMI:     27.57 Temp:     98.1 degrees F oral Pulse rate:   64 / minute Pulse rhythm:   regular BP sitting:   110 / 80  (left arm) Cuff size:   regular  Vitals Entered By: Linde Gillis CMA Duncan Dull) (May 23, 2010 11:44 AM) CC: discuss concerns, 30 minute appt   History of Present Illness: 65 yo here for follow up of myalgias. For past two weeks,  noticed myalgias and nonspecific symptoms that start around noon.  Aches start from top of head to waist, never involves her legs. No nausea or vomiting. No muscles weakness. Afebrile, no changes in medications. No sore throat although she did have one last week. No other URI symptoms. Tylenol does help with the pain.   No dysuria, hematuria, or increased frequency.  Current Medications (verified): 1)  Prozac 40 Mg Caps (Fluoxetine Hcl) .... Once Daily 2)  Wellbutrin Xl 300 Mg Xr24h-Tab (Bupropion Hcl) .Marland Kitchen.. 1 1/2 Tab Daily 3)  Diltiazem Hcl 120 Mg Tabs (Diltiazem Hcl) .... Once Daily 4)  Coumadin 2.5 Mg Tabs (Warfarin Sodium) .... As Directed 5)  Flecainide Acetate 150 Mg Tabs (Flecainide Acetate) .... 1/2 Tab Two Times A Day 6)  Synthroid 25 Mcg Tabs (Levothyroxine Sodium) .... Once Daily 7)  Dextroamphetamine Sulfate 5 Mg Tabs (Dextroamphetamine Sulfate) .... Once Daily 8)  Klonopin 1 Mg Tabs (Clonazepam) .... 2.5 Mg Once Daily 9)  Vitamin D 1000 Unit  Tabs (Cholecalciferol) .... Take 1 Tablet By Mouth Once A Day 10)  Glucosamine-Chondroitin  Caps (Glucosamine-Chondroit-Vit C-Mn) .Marland Kitchen.. 1-2 Daily As Needed 11)  Calcium 600 Mg Tabs (Calcium) .... One Tablet By Mouth Twice A Day 12)  Multivitamins   Tabs (Multiple Vitamin) .... Take 1 Tablet By Mouth Once A Day 13)  Vitamin E ?mg .... Take 1 Capsule By Mouth Once A Day 14)  Folic Acid ?mg .... Take 1 Tablet By Mouth Once A Day 15)  Aspirin  81 Mg  Tabs (Aspirin) .... Take 1 Tablet By Mouth Once A Day  Allergies: 1)  ! Levaquin (Levofloxacin) 2)  ! Darvocet  Past History:  Past Medical History: Last updated: 05/16/2010 Anxiety Depression Ventricular Tachycardia CVA 12/2007  Past Surgical History: Last updated: 2010/05/06 Hysterecotomy Tonsillectomy  Family History: Last updated: 06-May-2010 dad had MS Mom died at 5 in MVA no children  Social History: Last updated: 05/16/2010 Retired  Married  Tobacco Use - No.  Alcohol Use - no Regular Exercise - yes Lives in [place] with her husband, no children. [employer]  Risk Factors: Exercise: yes (03/11/2010)  Risk Factors: Smoking Status: never (03/11/2010)  Review of Systems      See HPI General:  Denies fever. Eyes:  Denies blurring. ENT:  Denies difficulty swallowing. CV:  Denies chest pain or discomfort. Resp:  Denies shortness of breath. GI:  Denies abdominal pain, bloody stools, and change in bowel habits.  Physical Exam  General:  GEN: nad, alert and oriented HEENT: mucous membranes moist, TM wnl bilateral, nasal exam wnl NECK: supple w/o LA CV: rrr.  no murmur PULM: ctab, no inc wob ABD: soft, +bs EXT: no edema SKIN: no acute rash    Impression & Recommendations:  Problem # 1:  MYALGIA (ICD-729.1) Assessment Unchanged Etiology  unknown, ?infectious although symptoms are lingering with no identifiable source. UA pos for blood, will send for cultulre to r/o UTI. Check CBC, BMET, CK monospot, TSH as well. If not infectious, ?neurological issue from her previous CVA. Her updated medication list for this problem includes:    Aspirin 81 Mg Tabs (Aspirin) .Marland Kitchen... Take 1 tablet by mouth once a day  Orders: Venipuncture (30865) TLB-CBC Platelet - w/Differential (85025-CBCD) TLB-TSH (Thyroid Stimulating Hormone) (84443-TSH) TLB-BMP (Basic Metabolic Panel-BMET) (80048-METABOL) TLB-Mono Test (Monospot) (86308-MONO) TLB-CK  Total Only(Creatine Kinase/CPK) (82550-CK)  Complete Medication List: 1)  Prozac 40 Mg Caps (Fluoxetine hcl) .... Once daily 2)  Wellbutrin Xl 300 Mg Xr24h-tab (Bupropion hcl) .Marland Kitchen.. 1 1/2 tab daily 3)  Diltiazem Hcl 120 Mg Tabs (Diltiazem hcl) .... Once daily 4)  Coumadin 2.5 Mg Tabs (Warfarin sodium) .... As directed 5)  Flecainide Acetate 150 Mg Tabs (Flecainide acetate) .... 1/2 tab two times a day 6)  Synthroid 25 Mcg Tabs (Levothyroxine sodium) .... Once daily 7)  Dextroamphetamine Sulfate 5 Mg Tabs (Dextroamphetamine sulfate) .... Once daily 8)  Klonopin 1 Mg Tabs (Clonazepam) .... 2.5 mg once daily 9)  Vitamin D 1000 Unit Tabs (Cholecalciferol) .... Take 1 tablet by mouth once a day 10)  Glucosamine-chondroitin Caps (Glucosamine-chondroit-vit c-mn) .Marland Kitchen.. 1-2 daily as needed 11)  Calcium 600 Mg Tabs (Calcium) .... One tablet by mouth twice a day 12)  Multivitamins Tabs (Multiple vitamin) .... Take 1 tablet by mouth once a day 13)  Vitamin E ?mg  .... Take 1 capsule by mouth once a day 14)  Folic Acid ?mg  .... Take 1 tablet by mouth once a day 15)  Aspirin 81 Mg Tabs (Aspirin) .... Take 1 tablet by mouth once a day  Other Orders: UA Dipstick w/o Micro (manual) (78469) T-Culture, Urine ([id number])   Orders Added: 1)  Venipuncture [44010] 2)  TLB-CBC Platelet - w/Differential [85025-CBCD] 3)  TLB-TSH (Thyroid Stimulating Hormone) [84443-TSH] 4)  TLB-BMP (Basic Metabolic Panel-BMET) [80048-METABOL] 5)  TLB-Mono Test (Monospot) [86308-MONO] 6)  TLB-CK Total Only(Creatine Kinase/CPK) [82550-CK] 7)  UA Dipstick w/o Micro (manual) [81002] 8)  T-Culture, Urine [[id number]] 9)  Est. Patient Level IV [34742]    Current Allergies (reviewed today): ! LEVAQUIN (LEVOFLOXACIN) ! DARVOCET

## 2010-09-02 NOTE — Progress Notes (Signed)
Summary: Update on how she is feeling  Phone Note Call from Patient Call back at Home Phone [phone]   Caller: Patient Call For: Monica Mannan MD Summary of Call: Patient is calling to update [name].  She is no longer having headaches but she states that her skin is still feeling hot on her face and upper back.  Feels like her skin has a real bad sunburn.  Please advise. Initial call taken by: Linde Gillis CMA Duncan Dull),  May 30, 2010 9:00 AM  Follow-up for Phone Call        I am really not sure what else could be going on.  It is possible that is related to her stroke and/or medications she is taking.  PLease have her make another appointment (30 min) to come see me next week either here or at [facility] on [address]. Monica Mannan MD  May 30, 2010 9:01 AM  Patient advised as instructed via telephone.  Appt made for 06/02/2010 at 2:45.  Linde Gillis CMA Duncan Dull)  May 30, 2010 9:07 AM

## 2010-09-02 NOTE — Miscellaneous (Signed)
  Clinical Lists Changes  Problems: Added new problem of DIZZINESS (ICD-780.4) - Signed Added new problem of PALPITATIONS (ICD-785.1) - Signed Medications: Added new medication of PROZAC 40 MG CAPS (FLUOXETINE HCL) once daily - Signed Added new medication of WELLBUTRIN XL 300 MG XR24H-TAB (BUPROPION HCL) 1 1/2 tab daily - Signed Allergies: Added new allergy or adverse reaction of LEVAQUIN (LEVOFLOXACIN) - Signed Added new allergy or adverse reaction of DARVOCET - Signed Observations: Added new observation of HEIGHT: 69 in (04/09/2010 8:19) Added new observation of PAST MED HX: Anxiety Depression Ventricular Tachycardia   (April 09, 2010 8:19) Added new observation of PMHVENTTACH: yes  (2010/04/09 8:19) Added new observation of DEPRESSION: yes  (April 09, 2010 8:19) Added new observation of PMH ANXIETY: yes  (2010-04-09 8:19) Added new observation of PAST SURG HX: Hysterectomy  (04/09/10 8:19) Added new observation of SOCIAL HX: Retired  Married  Tobacco Use - No.  Alcohol Use - yes Regular Exercise - no   (09-Apr-2010 8:19) Added new observation of REGULAREXERC: no  (2010-04-09 8:19) Added new observation of ALCOHOL COMM: yes  (04-09-2010 8:19) Added new observation of SMOK STATUS: never  (09-Apr-2010 8:19) Added new observation of MARITAL STAT: Married  (2010-04-09 8:19) Added new observation of WORK STATUS: retired  (2010/04/09 8:19) Added new observation of FAMILY HX: Father: deceased  Mother: deceased   (04-09-2010 8:19) Added new observation of NKA: F  (2010/04/09 8:19)       Family History: Father: deceased  Mother: deceased  Social History: Retired  Married  Tobacco Use - No.  Alcohol Use - yes Regular Exercise - no Smoking Status:  never Does Patient Exercise:  no   Past History:  Past Medical History: Anxiety Depression Ventricular Tachycardia  Past Surgical History: Hysterectomy   Problems:  Medical Problems Added: 1)  Dx of Palpitations   (ICD-785.1) 2)  Dx of Dizziness  (ICD-780.4)   Vital Signs:  Patient profile:   65 year old female Height:      69 inches

## 2010-09-02 NOTE — Assessment & Plan Note (Signed)
Summary: NEW PT TO ESTABH/DLO   Vital Signs:  Patient profile:   65 year old female Height:      69 inches Weight:      186 pounds BMI:     27.57 Temp:     98.0 degrees F oral Pulse rate:   60 / minute Pulse rhythm:   regular BP sitting:   102 / 70  (right arm) Cuff size:   regular  Vitals Entered By: Monica Whitaker CMA Monica Whitaker) 2010/05/02 10:34 AM) CC: new patient, establish care   History of Present Illness: 65 yo here to establish care.  H/o embolic CVA- 12/2007  medical cardioversion of afib into sinus rhythm.  Does have residual right sided weakness, memory problems and related anxiety and depression.  On coumadin, takes extra Diltizem when she is in afib.   Afib- see above, followed by Monica Whitaker. EKG at his office last month showed normal sinus rhythm with rate of 63 beats per minute, no significant ST or T wave changes.  Anxiety and depression- has had recurrent MDD for years but worsened after her stroke.  Has significant anxiety about falling.  Now that she lives in [place] some of that anxiety has improved.  Was seeing a psychiatrist in [place].  Taking Prozac 40 mg daily, Wellbutrin XL 450 mg daily and Dextroamphetamin 5 mg daily with 2.5 mg of Klonipin as needed (2 times per month).    Well woman- UTD on mammogram, colonscopy, immunizations.  Current Medications (verified): 1)  Prozac 40 Mg Caps (Fluoxetine Hcl) .... Once Daily 2)  Wellbutrin Xl 300 Mg Xr24h-Tab (Bupropion Hcl) .Marland Kitchen.. 1 1/2 Tab Daily 3)  Diltiazem Hcl 120 Mg Tabs (Diltiazem Hcl) .... Once Daily 4)  Coumadin 2.5 Mg Tabs (Warfarin Sodium) .... As Directed 5)  Flecainide Acetate 150 Mg Tabs (Flecainide Acetate) .... 1/2 Tab Two Times A Day 6)  Synthroid 25 Mcg Tabs (Levothyroxine Sodium) .... Once Daily 7)  Dextroamphetamine Sulfate 5 Mg Tabs (Dextroamphetamine Sulfate) .... Once Daily 8)  Klonopin 1 Mg Tabs (Clonazepam) .... 2.5 Mg Once Daily  Allergies: 1)  ! Levaquin (Levofloxacin) 2)  !  Darvocet  Past History:  Past Medical History: Last updated: 03/11/2010 Anxiety Depression Ventricular Tachycardia  Family History: Last updated: 02-May-2010 dad had MS Mom died at 11 in MVA no children  Social History: Last updated: 05/02/2010 Retired  Married  Tobacco Use - No.  Alcohol Use - no Regular Exercise - yes Lives in [place] with her husband, no children.  Risk Factors: Exercise: yes (03/11/2010)  Risk Factors: Smoking Status: never (03/11/2010)  Past Surgical History: Hysterecotomy Tonsillectomy  Family History: dad had MS Mom died at 78 in MVA no children  Social History: Retired  Married  Tobacco Use - No.  Alcohol Use - no Regular Exercise - yes Lives in [place] with her husband, no children.  Review of Systems      See HPI General:  Denies malaise. Eyes:  Denies blurring. ENT:  Denies difficulty swallowing. CV:  Denies chest pain or discomfort, palpitations, and shortness of breath with exertion. Resp:  Denies shortness of breath. GI:  Denies abdominal pain, bloody stools, and change in bowel habits. GU:  Denies urinary frequency and urinary hesitancy. MS:  Denies joint pain, joint redness, and joint swelling. Derm:  Denies rash. Neuro:  Denies headaches. Psych:  Denies panic attacks, sense of great danger, suicidal thoughts/plans, thoughts of violence, unusual visions or sounds, and thoughts /  plans of harming others. Endo:  Denies cold intolerance and heat intolerance. Heme:  Denies abnormal bruising and bleeding. Allergy:  Denies hives or rash and itching eyes.  Physical Exam  General:  Well developed, well nourished, in no acute distress. Head:  normocephalic and atraumatic Eyes:  vision grossly intact, pupils equal, pupils round, and pupils reactive to light.   Ears:  R ear normal and L ear normal.   Nose:  no external deformity.   Mouth:  good dentition.   Lungs:  Normal respiratory effort, chest expands  symmetrically. Lungs are clear to auscultation, no crackles or wheezes. Heart:  Normal rate and regular rhythm. S1 and S2 normal without gallop, murmur, click, rub or other extra sounds. Abdomen:  Bowel sounds positive,abdomen soft and non-tender without masses, organomegaly or hernias noted. Msk:  No deformity or scoliosis noted of thoracic or lumbar spine.   Extremities:  No clubbing, cyanosis, edema, or deformity noted with normal full range of motion of all joints.   Neurologic:  alert & oriented X3 and gait normal.   Skin:  Intact without lesions or rashes. Psych:  Normal affect.   Impression & Recommendations:  Problem # 1:  ANXIETY DEPRESSION (ICD-300.4) Assessment Unchanged Time spent with patient 45 minutes, more than 50% of this time was spent counseling patient on her history of anxiety, depression, CVA, reviewing old records. Appears stable from a pharmaceutical standpoint, I would like to refer her to see Monica Whitaker for psychotherapy as it has been a difficult adjustment for her since since the stroke.  Orders: Psychology Referral (Psychology)  Problem # 2:  ATRIAL FIBRILLATION (ICD-427.31) Assessment: Unchanged Stable, followed by Monica Whitaker. Her updated medication list for this problem includes:    Diltiazem Hcl 120 Mg Tabs (Diltiazem hcl) ..... Once daily    Coumadin 2.5 Mg Tabs (Warfarin sodium) .Marland Kitchen... As directed    Flecainide Acetate 150 Mg Tabs (Flecainide acetate) .Marland Kitchen... 1/2 tab two times a day  Problem # 3:  Hx of CEREBRAL EMBOLISM WITHOUT MENTION INFARCT (ICD-434.10) Assessment: Unchanged  Her updated medication list for this problem incldes:    Coumadin 2.5 Mg Tabs (Warfarin sodium) .Marland Kitchen... As directed On Coumadin.  discussed importance of exercise.  [facility]  Problem # 4:  HYPOTHYROIDISM (ICD-244.9) Assessment: Unchanged Reviewed previously labs, has been stable on current dose. Her updated medication list for this problem includes:     Synthroid 25 Mcg Tabs (Levothyroxine sodium) ..... Once daily  Complete Medication List: 1)  Prozac 40 Mg Caps (Fluoxetine hcl) .... Once daily 2)  Wellbutrin Xl 300 Mg Xr24h-tab (Bupropion hcl) .Marland Kitchen.. 1 1/2 tab daily 3)  Diltiazem Hcl 120 Mg Tabs (Diltiazem hcl) .... Once daily 4)  Coumadin 2.5 Mg Tabs (Warfarin sodium) .... As directed 5)  Flecainide Acetate 150 Mg Tabs (Flecainide acetate) .... 1/2 tab two times a day 6)  Synthroid 25 Mcg Tabs (Levothyroxine sodium) .... Once daily 7)  Dextroamphetamine Sulfate 5 Mg Tabs (Dextroamphetamine sulfate) .... Once daily 8)  Klonopin 1 Mg Tabs (Clonazepam) .... 2.5 mg once daily  Patient Instructions: 1)  Great to meet you, Monica Whitaker. 2)  Please stop by to see Shirlee Limerick on your way out.  Current Allergies (reviewed today): ! LEVAQUIN (LEVOFLOXACIN) ! DARVOCET  TD Result Date:  04/18/2008 TD Result:  historical TD Next Due:  10 yr Herpes Zoster Result Date:  04/18/2008 Herpes Zoster Result:  historical HDL Result Date:  03/28/2010 HDL Result:  128 HDL  Next Due:  1 yr LDL Result Date:  03/28/2010 LDL Result:  114 LDL Next Due:  1 yr Flex Sig Next Due:  Not Indicated Colonoscopy Result Date:  03/19/2010 Colonoscopy Result:  historical Hemoccult Next Due:  Not Indicated PAP Next Due:  Not Indicated Mammogram Result Date:  11/06/2009 Mammogram Result:  historical Mammogram Next Due:  1 yr

## 2010-09-02 NOTE — Letter (Signed)
Summary: Letter from pt to Dr.Aron  Letter from pt to Dr.Aron   Imported By: Beau Fanny 05/20/2010 10:48:19  _____________________________________________________________________  External Attachment:    Type:   Image     Comment:   External Document

## 2010-09-02 NOTE — Medication Information (Signed)
Summary: CCR/AMD  Anticoagulant Therapy  Managed by: Cloyde Reams, RN, BSN Referring MD: Mariah Milling PCP: DR. Norvel Richards MD: Mariah Milling Indication 1: CVA Lab Used: [facility] [place] Site: [place] INR POC 2.4 INR RANGE 2-3  Dietary changes: no    Health status changes: no    Bleeding/hemorrhagic complications: no    Recent/future hospitalizations: no    Any changes in medication regimen? no    Recent/future dental: no  Any missed doses?: no       Is patient compliant with meds? yes       Allergies: 1)  ! Levaquin (Levofloxacin) 2)  ! Darvocet  Anticoagulation Management History:      The patient is taking warfarin and comes in today for a routine follow up visit.  Negative risk factors for bleeding include an age less than 56 years old.  The bleeding index is 'low risk'.  Negative CHADS2 values include Age > 32 years old.  Anticoagulation responsible provider: Falana Clagg.  INR POC: 2.4.  Cuvette Lot#: [id number].  Exp: 05/2011.    Anticoagulation Management Assessment/Plan:      The patient's current anticoagulation dose is Coumadin 2.5 mg tabs: as directed.  The target INR is 2.0-3.0.  The next INR is due 05/07/2010.  Results were reviewed/authorized by Cloyde Reams, RN, BSN.  She was notified by Cloyde Reams RN.         Prior Anticoagulation Instructions: INR 2.0  Continue taking 1 tab daily. Recheck in 4 weeks.   Current Anticoagulation Instructions: INR 2.4  Continue on same dosage 2.5mg  daily.  Recheck in 4 weeks.

## 2010-09-02 NOTE — Medication Information (Signed)
Summary: rov/ewj  Anticoagulant Therapy  Managed by: Cloyde Reams, RN, BSN Referring MD: Mariah Milling PCP: DR. Norvel Richards MD: Mariah Milling Indication 1: CVA Lab Used: [facility] [place] Site: [place] INR POC 2.6 INR RANGE 2-3  Dietary changes: no    Health status changes: no    Bleeding/hemorrhagic complications: no    Recent/future hospitalizations: no    Any changes in medication regimen? no    Recent/future dental: no  Any missed doses?: no       Is patient compliant with meds? yes       Allergies: 1)  ! Levaquin (Levofloxacin) 2)  ! Darvocet  Anticoagulation Management History:      The patient is taking warfarin and comes in today for a routine follow up visit.  Negative risk factors for bleeding include an age less than 25 years old.  The bleeding index is 'low risk'.  Negative CHADS2 values include Age > 38 years old.  Anticoagulation responsible Monica Whitaker: [facility].  INR POC: 2.6.  Cuvette Lot#: [id number].  Exp: 06/2011.    Anticoagulation Management Assessment/Plan:      The patient's current anticoagulation dose is Coumadin 2.5 mg tabs: as directed.  The target INR is 2.0-3.0.  The next INR is due 06/04/2010.  Results were reviewed/authorized by Cloyde Reams, RN, BSN.  She was notified by Cloyde Reams RN.         Prior Anticoagulation Instructions: INR 2.4  Continue on same dosage 2.5mg  daily.  Recheck in 4 weeks.    Current Anticoagulation Instructions: INR 2.6  Continue on same dosage 2.5mg  daily.  Recheck in 4 weeks.

## 2010-09-04 NOTE — Medication Information (Signed)
Summary: [facility]  Anticoagulant Therapy  Managed by: Inactive Referring MD: Mariah Milling PCP: DR. Norvel Richards MD: Mariah Milling Indication 1: CVA Lab Used: [facility] [place] Site: [place] INR RANGE 2-3          Comments: Pt now having Coumadin followed by Dr Dayton Martes at [facility]. Cloyde Reams RN  July 23, 2010 2:48 PM   Allergies: 1)  ! Levaquin (Levofloxacin) 2)  ! Darvocet  Anticoagulation Management History:      Negative risk factors for bleeding include an age less than 86 years old.  The bleeding index is 'low risk'.  Negative CHADS2 values include Age > 49 years old.  Her last INR was 2.8.  Anticoagulation responsible provider: [facility].  Exp: 06/2011.    Anticoagulation Management Assessment/Plan:      The patient's current anticoagulation dose is Coumadin 2.5 mg tabs: as directed, Warfarin sodium 5 mg tabs: Take 1 tablet by mouth once a day.  The target INR is 2.0-3.0.  The next INR is due 4 weeks.  Results were reviewed/authorized by Inactive.         Prior Anticoagulation Instructions: INR 2.6  Continue on same dosage 2.5mg  daily.  Recheck in 4 weeks.

## 2010-09-04 NOTE — Progress Notes (Signed)
Summary: Rx Dextroamphetamine  Phone Note Refill Request Call back at Home Phone [phone] Message from:  Patient on August 20, 2010 10:01 AM  Refills Requested: Medication #1:  DEXTROAMPHETAMINE SULFATE 5 MG TABS once daily Patient is requesting a refill to be picked up at our office.  She wants [name] to know that she is seeing a Psychiatrist in two weeks but would like a refill until that time.  Please advise.   Method Requested: Pick up at Office Initial call taken by: Linde Gillis CMA Duncan Dull),  August 20, 2010 10:01 AM    Prescriptions: DEXTROAMPHETAMINE SULFATE 5 MG TABS (DEXTROAMPHETAMINE SULFATE) once daily  #30 x 0   Entered and Authorized by:   Ruthe Mannan MD   Signed by:   Ruthe Mannan MD on 08/20/2010   Method used:   Print then Give to Patient   RxID:   [id number]   Appended Document: Rx Dextroamphetamine Rx left at front desk.  Was unable to reach patient by telephone, they are still working on our phone systems.

## 2010-09-04 NOTE — Letter (Signed)
Summary: [facility]  [facility]   Imported By: Maryln Gottron 08/14/2010 15:42:08  _____________________________________________________________________  External Attachment:    Type:   Image     Comment:   External Document

## 2010-09-10 NOTE — Letter (Signed)
Summary: [facility] note  [facility] note   Imported By: Kassie Mends 09/05/2010 11:37:52  _____________________________________________________________________  External Attachment:    Type:   Image     Comment:   External Document

## 2010-09-22 ENCOUNTER — Ambulatory Visit (INDEPENDENT_AMBULATORY_CARE_PROVIDER_SITE_OTHER): Payer: 59

## 2010-09-22 ENCOUNTER — Encounter: Payer: Self-pay | Admitting: Family Medicine

## 2010-09-22 DIAGNOSIS — I4891 Unspecified atrial fibrillation: Secondary | ICD-10-CM

## 2010-09-22 DIAGNOSIS — Z5181 Encounter for therapeutic drug level monitoring: Secondary | ICD-10-CM

## 2010-09-22 DIAGNOSIS — Z7901 Long term (current) use of anticoagulants: Secondary | ICD-10-CM

## 2010-09-22 LAB — CONVERTED CEMR LAB: INR: 2

## 2010-09-30 NOTE — Medication Information (Signed)
Summary: protime/tw  Anticoagulant Therapy  Managed by: Inactive Referring MD: Mariah Milling PCP: DR. Norvel Richards MD: Mariah Milling Indication 1: CVA Lab Used: [facility] [place] Site: [place] PT 23.6 INR RANGE 2-3           Allergies: 1)  ! Levaquin (Levofloxacin) 2)  ! Darvocet  Anticoagulation Management History:      Negative risk factors for bleeding include an age less than 64 years old.  The bleeding index is 'low risk'.  Negative CHADS2 values include Age > 66 years old.  Her last INR was 2.3 and today's INR is 2.0.  Prothrombin time is 23.6.  Anticoagulation responsible provider: [facility].  Exp: 06/2011.    Anticoagulation Management Assessment/Plan:      The patient's current anticoagulation dose is Coumadin 2.5 mg tabs: as directed, Warfarin sodium 5 mg tabs: Take 1 tablet by mouth once a day.  The target INR is 2.0-3.0.  The next INR is due 4 weeks.  Results were reviewed/authorized by Inactive.         Prior Anticoagulation Instructions: INR 2.6  Continue on same dosage 2.5mg  daily.  Recheck in 4 weeks.    Laboratory Results   Blood Tests   Date/Time Recieved: September 22, 2010 11:20 AM  Date/Time Reported: September 22, 2010 11:20 AM   PT: 23.6 s   (Normal Range: 10.6-13.4)  INR: 2.0   (Normal Range: 0.88-1.12   Therap INR: 2.0-3.5)      ANTICOAGULATION RECORD PREVIOUS REGIMEN & LAB RESULTS Anticoagulation Diagnosis:  CVA on  03/11/2010 Previous INR Goal Range:  2-3 on  03/11/2010 Previous INR:  2.3 on  08/26/2010 Previous Coumadin Dose(mg):  2.5 mg daily on  07/29/2010 Previous Regimen:  2.5 mg daily on  07/29/2010 Previous Coagulation Comments:  . on  06/13/2010  NEW REGIMEN & LAB RESULTS Current INR: 2.0 Regimen: 2.5 mg daily  (no change)  Provider: Syvilla Martin      Repeat testing in: 4 weeks MEDICATIONS PROZAC 40 MG CAPS (FLUOXETINE HCL) once daily WELLBUTRIN XL 300 MG XR24H-TAB (BUPROPION HCL) 1 1/2 tab daily DILTIAZEM HCL 120  MG TABS (DILTIAZEM HCL) once daily COUMADIN 2.5 MG TABS (WARFARIN SODIUM) as directed FLECAINIDE ACETATE 150 MG TABS (FLECAINIDE ACETATE) 1/2 tab two times a day SYNTHROID 25 MCG TABS (LEVOTHYROXINE SODIUM) once daily DEXTROAMPHETAMINE SULFATE 5 MG TABS (DEXTROAMPHETAMINE SULFATE) once daily KLONOPIN 1 MG TABS (CLONAZEPAM) 2.5 mg once daily VITAMIN D 1000 UNIT  TABS (CHOLECALCIFEROL) Take 1 tablet by mouth once a day GLUCOSAMINE-CHONDROITIN  CAPS (GLUCOSAMINE-CHONDROIT-VIT C-MN) 1-2 daily as needed CALCIUM 600 MG TABS (CALCIUM) one tablet by mouth twice a day MULTIVITAMINS   TABS (MULTIPLE VITAMIN) Take 1 tablet by mouth once a day * VITAMIN E ?MG Take 1 capsule by mouth once a day * FOLIC ACID ?MG Take 1 tablet by mouth once a day ASPIRIN 81 MG  TABS (ASPIRIN) Take 1 tablet by mouth once a day WARFARIN SODIUM 5 MG TABS (WARFARIN SODIUM) Take 1 tablet by mouth once a day  Dose has been reviewed with patient or caretaker during this visit.  Reviewed by: Allison Quarry  Anticoagulation Visit Questionnaire      Coumadin dose missed/changed:  No      Abnormal Bleeding Symptoms:  No Any diet changes including alcohol intake, vegetables or greens since the last visit:  No Any illnesses or hospitalizations since the last visit:  No Any signs of clotting since the last visit (including chest discomfort, dizziness, shortness of breath,  arm tingling, slurred speech, swelling or redness in leg):  No

## 2010-10-20 ENCOUNTER — Ambulatory Visit (INDEPENDENT_AMBULATORY_CARE_PROVIDER_SITE_OTHER): Payer: 59

## 2010-10-20 ENCOUNTER — Encounter: Payer: Self-pay | Admitting: Family Medicine

## 2010-10-20 DIAGNOSIS — Z5181 Encounter for therapeutic drug level monitoring: Secondary | ICD-10-CM

## 2010-10-20 DIAGNOSIS — Z7901 Long term (current) use of anticoagulants: Secondary | ICD-10-CM

## 2010-10-20 DIAGNOSIS — I4891 Unspecified atrial fibrillation: Secondary | ICD-10-CM

## 2010-10-20 LAB — CONVERTED CEMR LAB: Prothrombin Time: 28.5 s

## 2010-10-24 ENCOUNTER — Other Ambulatory Visit: Payer: Self-pay | Admitting: Family Medicine

## 2010-10-24 MED ORDER — DEXTROAMPHETAMINE SULFATE 5 MG PO TABS: 5.0000 mg | ORAL_TABLET | Freq: Every day | ORAL | Status: DC

## 2010-10-24 NOTE — Telephone Encounter (Signed)
Rx left at front desk for patient pick up, she will pick it up on Monday.

## 2010-10-24 NOTE — Telephone Encounter (Signed)
Patient has an appt with new therapist next week.  She did not get along with the last one to well.  Please refill.  She is at [facility] with her husband who just had heart surgery.

## 2010-10-30 ENCOUNTER — Telehealth: Payer: Self-pay | Admitting: Radiology

## 2010-10-30 ENCOUNTER — Other Ambulatory Visit: Payer: Self-pay | Admitting: Family Medicine

## 2010-10-30 DIAGNOSIS — I4891 Unspecified atrial fibrillation: Secondary | ICD-10-CM

## 2010-10-30 DIAGNOSIS — Z7902 Long term (current) use of antithrombotics/antiplatelets: Secondary | ICD-10-CM

## 2010-10-30 DIAGNOSIS — Z5181 Encounter for therapeutic drug level monitoring: Secondary | ICD-10-CM

## 2010-10-30 NOTE — Medication Information (Signed)
Summary: protime/tw  Anticoagulant Therapy  Managed by: Inactive Referring MD: Mariah Milling PCP: DR. Norvel Richards MD: Mariah Milling Indication 1: CVA Lab Used: [facility] [place] Site: [place] PT 28.5 INR RANGE 2-3           Allergies: 1)  ! Levaquin (Levofloxacin) 2)  ! Darvocet  Anticoagulation Management History:      Negative risk factors for bleeding include an age less than 64 years old.  The bleeding index is 'low risk'.  Negative CHADS2 values include Age > 72 years old.  Her last INR was 2.0 and today's INR is 2.4.  Prothrombin time is 28.5.  Anticoagulation responsible provider: [facility].  Exp: 06/2011.    Anticoagulation Management Assessment/Plan:      The patient's current anticoagulation dose is Coumadin 2.5 mg tabs: as directed, Warfarin sodium 5 mg tabs: Take 1 tablet by mouth once a day.  The target INR is 2.0-3.0.  The next INR is due 4 weeks.  Results were reviewed/authorized by Inactive.         Prior Anticoagulation Instructions: INR 2.6  Continue on same dosage 2.5mg  daily.  Recheck in 4 weeks.    Laboratory Results   Blood Tests   Date/Time Recieved: October 20, 2010 11:06 AM  Date/Time Reported: October 20, 2010 11:06 AM   PT: 28.5 s   (Normal Range: 10.6-13.4)  INR: 2.4   (Normal Range: 0.88-1.12   Therap INR: 2.0-3.5)      ANTICOAGULATION RECORD PREVIOUS REGIMEN & LAB RESULTS Anticoagulation Diagnosis:  CVA on  03/11/2010 Previous INR Goal Range:  2-3 on  03/11/2010 Previous INR:  2.0 on  09/22/2010 Previous Coumadin Dose(mg):  2.5 mg daily on  07/29/2010 Previous Regimen:  2.5 mg daily on  07/29/2010 Previous Coagulation Comments:  . on  06/13/2010  NEW REGIMEN & LAB RESULTS Current INR: 2.4 Regimen: 2.5 mg daily  (no change)  Provider: Ashtan Girtman      Repeat testing in: 4 weeks MEDICATIONS PROZAC 40 MG CAPS (FLUOXETINE HCL) once daily WELLBUTRIN XL 300 MG XR24H-TAB (BUPROPION HCL) 1 1/2 tab daily DILTIAZEM HCL 120 MG  TABS (DILTIAZEM HCL) once daily COUMADIN 2.5 MG TABS (WARFARIN SODIUM) as directed FLECAINIDE ACETATE 150 MG TABS (FLECAINIDE ACETATE) 1/2 tab two times a day SYNTHROID 25 MCG TABS (LEVOTHYROXINE SODIUM) once daily DEXTROAMPHETAMINE SULFATE 5 MG TABS (DEXTROAMPHETAMINE SULFATE) once daily KLONOPIN 1 MG TABS (CLONAZEPAM) 2.5 mg once daily VITAMIN D 1000 UNIT  TABS (CHOLECALCIFEROL) Take 1 tablet by mouth once a day GLUCOSAMINE-CHONDROITIN  CAPS (GLUCOSAMINE-CHONDROIT-VIT C-MN) 1-2 daily as needed CALCIUM 600 MG TABS (CALCIUM) one tablet by mouth twice a day MULTIVITAMINS   TABS (MULTIPLE VITAMIN) Take 1 tablet by mouth once a day * VITAMIN E ?MG Take 1 capsule by mouth once a day * FOLIC ACID ?MG Take 1 tablet by mouth once a day ASPIRIN 81 MG  TABS (ASPIRIN) Take 1 tablet by mouth once a day WARFARIN SODIUM 5 MG TABS (WARFARIN SODIUM) Take 1 tablet by mouth once a day  Dose has been reviewed with patient or caretaker during this visit.  Reviewed by: Allison Quarry  Anticoagulation Visit Questionnaire      Coumadin dose missed/changed:  No      Abnormal Bleeding Symptoms:  No Any diet changes including alcohol intake, vegetables or greens since the last visit:  No Any illnesses or hospitalizations since the last visit:  No Any signs of clotting since the last visit (including chest discomfort, dizziness, shortness of breath,  arm tingling, slurred speech, swelling or redness in leg):  No

## 2010-10-30 NOTE — Telephone Encounter (Signed)
Please sign and close standing order for INR  

## 2010-11-17 ENCOUNTER — Ambulatory Visit (INDEPENDENT_AMBULATORY_CARE_PROVIDER_SITE_OTHER): Payer: 59 | Admitting: Family Medicine

## 2010-11-17 DIAGNOSIS — I4891 Unspecified atrial fibrillation: Secondary | ICD-10-CM

## 2010-11-17 DIAGNOSIS — Z5181 Encounter for therapeutic drug level monitoring: Secondary | ICD-10-CM

## 2010-11-17 DIAGNOSIS — Z7902 Long term (current) use of antithrombotics/antiplatelets: Secondary | ICD-10-CM

## 2010-11-17 LAB — POCT INR: INR: 1.5

## 2010-11-17 NOTE — Patient Instructions (Signed)
Changed dose today to 2.5 mg daily , 5 mg Mon, Thur recheck in 2 weeks

## 2010-12-01 ENCOUNTER — Ambulatory Visit: Payer: 59

## 2010-12-02 ENCOUNTER — Ambulatory Visit (INDEPENDENT_AMBULATORY_CARE_PROVIDER_SITE_OTHER): Payer: 59 | Admitting: Family Medicine

## 2010-12-02 DIAGNOSIS — Z5181 Encounter for therapeutic drug level monitoring: Secondary | ICD-10-CM

## 2010-12-02 DIAGNOSIS — Z7902 Long term (current) use of antithrombotics/antiplatelets: Secondary | ICD-10-CM

## 2010-12-02 DIAGNOSIS — Z7901 Long term (current) use of anticoagulants: Secondary | ICD-10-CM

## 2010-12-02 DIAGNOSIS — I4891 Unspecified atrial fibrillation: Secondary | ICD-10-CM

## 2010-12-02 NOTE — Patient Instructions (Signed)
Continue current dose, check in 4 weeks  

## 2010-12-05 ENCOUNTER — Encounter: Payer: Self-pay | Admitting: Family Medicine

## 2010-12-08 ENCOUNTER — Encounter: Payer: Self-pay | Admitting: Family Medicine

## 2010-12-08 ENCOUNTER — Ambulatory Visit (INDEPENDENT_AMBULATORY_CARE_PROVIDER_SITE_OTHER): Payer: 59 | Admitting: Family Medicine

## 2010-12-08 ENCOUNTER — Ambulatory Visit (INDEPENDENT_AMBULATORY_CARE_PROVIDER_SITE_OTHER)
Admission: RE | Admit: 2010-12-08 | Discharge: 2010-12-08 | Disposition: A | Discharge status: Home / Self Care | Payer: 59 | Source: Ambulatory Visit | Attending: Family Medicine | Admitting: Family Medicine

## 2010-12-08 DIAGNOSIS — M542 Cervicalgia: Secondary | ICD-10-CM

## 2010-12-08 DIAGNOSIS — Z1231 Encounter for screening mammogram for malignant neoplasm of breast: Secondary | ICD-10-CM

## 2010-12-08 MED ORDER — CYCLOBENZAPRINE HCL 5 MG PO TABS: 5.0000 mg | ORAL_TABLET | Freq: Three times a day (TID) | ORAL | Status: DC | PRN

## 2010-12-08 NOTE — Assessment & Plan Note (Signed)
Deteriorated. ?possible DJD of cervical spine with some intermittent trapezius spasm. Treat with as needed flexeril. Neck films today.

## 2010-12-08 NOTE — Progress Notes (Signed)
65 yo here for right sided neck pain x 2-3 years. Feels like it has been getting worse past several months. Only feels it when she is typing on the computer or driving. No pain or radiculopathy down her arm or into her fingers. No UE weakness worsened since her stroke.   Was following her for myalgias and word finding deficits, referred back to neuro who cleared her from a neuro standpoint.  Last saw neuro in December.      The PMH, PSH, Social History, Family History, Medications, and allergies have been reviewed in [facility], and have been updated if relevant.   Review of Systems       See HPI General:  Complains of malaise; denies fever, loss of appetite, weakness, and weight loss. MS:  Complains of muscle aches; denies joint pain, joint redness, joint swelling, and loss of strength.  Physical Exam BP 102/72  Pulse 78  Temp(Src) 98.3 F (36.8 C) (Oral)  Ht 5\' 9"  (1.753 m)  Wt 187 lb 12.8 oz (85.186 kg)  BMI 27.73 kg/m2  General:  GEN: nad, alert and oriented HEENT: mucous membranes moist, TM wnl bilateral, nasal exam wnl NECK: supple w/o LA CV: rrr.  no murmur PULM: ctab, no inc wob ABD: soft, +bs EXT: no edema, mild right trapezius spasm, spurling negative, normal strength.   SKIN: no acute rash  Neurologic:  alert & oriented X3, cranial nerves II-XII intact, strength normal in all extremities, sensation intact to light touch, and DTRs symmetrical and normal.   Psych:  Normal affect.

## 2010-12-30 ENCOUNTER — Ambulatory Visit (INDEPENDENT_AMBULATORY_CARE_PROVIDER_SITE_OTHER): Payer: 59 | Admitting: Family Medicine

## 2010-12-30 DIAGNOSIS — Z5181 Encounter for therapeutic drug level monitoring: Secondary | ICD-10-CM

## 2010-12-30 DIAGNOSIS — I4891 Unspecified atrial fibrillation: Secondary | ICD-10-CM

## 2010-12-30 DIAGNOSIS — Z7902 Long term (current) use of antithrombotics/antiplatelets: Secondary | ICD-10-CM

## 2010-12-30 DIAGNOSIS — Z7901 Long term (current) use of anticoagulants: Secondary | ICD-10-CM

## 2010-12-30 LAB — POCT INR: INR: 1.7

## 2011-01-01 ENCOUNTER — Ambulatory Visit (INDEPENDENT_AMBULATORY_CARE_PROVIDER_SITE_OTHER): Payer: 59 | Admitting: Cardiovascular Disease

## 2011-01-01 ENCOUNTER — Encounter: Payer: Self-pay | Admitting: Cardiovascular Disease

## 2011-01-01 VITALS — BP 90/62 | HR 65 | Ht 71.0 in | Wt 189.0 lb

## 2011-01-01 DIAGNOSIS — E039 Hypothyroidism, unspecified: Secondary | ICD-10-CM

## 2011-01-01 DIAGNOSIS — R5381 Other malaise: Secondary | ICD-10-CM

## 2011-01-01 DIAGNOSIS — I4891 Unspecified atrial fibrillation: Secondary | ICD-10-CM

## 2011-01-01 DIAGNOSIS — R609 Edema, unspecified: Secondary | ICD-10-CM

## 2011-01-01 DIAGNOSIS — R42 Dizziness and giddiness: Secondary | ICD-10-CM

## 2011-01-01 DIAGNOSIS — R5383 Other fatigue: Secondary | ICD-10-CM

## 2011-01-01 MED ORDER — FLECAINIDE ACETATE 100 MG PO TABS: 100.0000 mg | ORAL_TABLET | Freq: Two times a day (BID) | ORAL | Status: DC

## 2011-01-01 NOTE — Progress Notes (Signed)
   Patient ID: Monica Whitaker, female    DOB: March 25, 1946, 65 y.o.   MRN: [id number]  HPI Comments: Monica Whitaker is a 64 year old woman with a history of atrial fibrillation over the past several years, stroke in May 2009 after medical cardioversion to sinus rhythm who has been on chronic warfarin therapy and presents 4 routine followup.  She reports that over the past 10 days, she has had 2 episodes of atrial fibrillation. She took extra short-acting diltiazem 120 mg in addition to her flecainide 75 mg and diltiazem 120 mg long-acting dose and it did not break until the morning after going all night long. This happened twice, on May 23 and 29th.She had dizziness, malaise while in atrial fibrillation. One episode, she felt that she was going to pass out after standing up. Blood pressure has been running low at baseline.   EKG shows normal sinus rhythm with rate of 65 beats per minute, no significant ST or T wave changes.      Review of Systems  Constitutional: Negative.   HENT: Negative.   Eyes: Negative.   Respiratory: Positive for shortness of breath.   Cardiovascular: Positive for palpitations and leg swelling.  Gastrointestinal: Negative.   Musculoskeletal: Negative.   Skin: Negative.   Neurological: Positive for dizziness.  Hematological: Negative.   Psychiatric/Behavioral: Negative.   All other systems reviewed and are negative.   Blood pressure 90/62, pulse 65, height 5\' 11"  (1.803 m), weight 189 lb (85.73 kg).   Physical Exam  Nursing note and vitals reviewed. Constitutional: She is oriented to person, place, and time. She appears well-developed and well-nourished.  HENT:  Head: Normocephalic.  Nose: Nose normal.  Mouth/Throat: Oropharynx is clear and moist.  Eyes: Conjunctivae are normal. Pupils are equal, round, and reactive to light.  Neck: Normal range of motion. Neck supple. No JVD present.  Cardiovascular: Normal rate, regular rhythm, S1 normal, S2 normal, normal heart  sounds and intact distal pulses.  Exam reveals no gallop and no friction rub.   No murmur heard. Pulmonary/Chest: Effort normal and breath sounds normal. No respiratory distress. She has no wheezes. She has no rales. She exhibits no tenderness.  Abdominal: Soft. Bowel sounds are normal. She exhibits no distension. There is no tenderness.  Musculoskeletal: Normal range of motion. She exhibits no edema and no tenderness.  Lymphadenopathy:    She has no cervical adenopathy.  Neurological: She is alert and oriented to person, place, and time. Coordination normal.  Skin: Skin is warm and dry. No rash noted. No erythema.  Psychiatric: She has a normal mood and affect. Her behavior is normal. Judgment and thought content normal.         Assessment and Plan

## 2011-01-01 NOTE — Assessment & Plan Note (Signed)
I am concerned about recent episodes of dizziness in the setting of taking extra Cardizem when she is in atrial fibrillation. I suspect her blood pressure is low from too much medication and she is hypotensive. We will decrease her extra short acting Cardizem dose to 60 mg p.r.n. After flecainide p.r.n..

## 2011-01-01 NOTE — Assessment & Plan Note (Signed)
Recent breakthrough atrial fibrillation. We will increase her flecainide to 100 mg b.i.d.. If she has additional episodes of atrial fibrillation, we have suggested she take flecainide 50 mg p.r.n.. If he does not convert to normal sinus rhythm, we have suggested she take short-acting diltiazem 60 mg.

## 2011-01-01 NOTE — Patient Instructions (Signed)
Please increase flecainide to 100 mg in the AM and PM. Continue diltiazem CD 120 mg daily For breakthrough atrial fibrillation, take an extra flecainide 50 mg (1/2 tab) If atrial fibrillation does not convert, take a 1/2 tab of the short acting diltiazem (60 mg ) Please call us if you have new issues that need to be addressed before your next appt.  We will call you for a follow up Appt. In 6 months

## 2011-01-01 NOTE — Assessment & Plan Note (Signed)
She does have chronic edema. This is likely secondary to venous insufficiency. We have suggested TED hose, Ace wraps, leg elevation as needed.

## 2011-01-01 NOTE — Assessment & Plan Note (Signed)
She does have malaise and fatigue with atrial fibrillation though otherwise has been feeling well.

## 2011-01-22 ENCOUNTER — Telehealth: Payer: Self-pay | Admitting: Family Medicine

## 2011-01-22 ENCOUNTER — Telehealth: Payer: Self-pay | Admitting: *Deleted

## 2011-01-22 NOTE — Telephone Encounter (Signed)
Patient called to say that she went to the ER in [place] yesterday for urinary bleeding. They did a CT Scan and  told her there was something there on her liver? She could not recall what they told her though? She has a copy of her CT and wants you to get her an appt tomorrow with Urology in [place] who she has seen last year. Also wants [place] to call her back today by 1:30pm . She is on her way home from [place] and will be home by 4:50pm. Her cell phone # is [phone].

## 2011-01-22 NOTE — Telephone Encounter (Signed)
Patient would like a different antibiotic than the one they gave her in [place].  She stated that the antibiotic they gave her was a Levaquin based antibiotic and she has had reaction to Levaquin before.  Uses [facility].

## 2011-01-23 ENCOUNTER — Ambulatory Visit (INDEPENDENT_AMBULATORY_CARE_PROVIDER_SITE_OTHER): Payer: 59 | Admitting: Family Medicine

## 2011-01-23 DIAGNOSIS — Z7902 Long term (current) use of antithrombotics/antiplatelets: Secondary | ICD-10-CM

## 2011-01-23 DIAGNOSIS — Z5181 Encounter for therapeutic drug level monitoring: Secondary | ICD-10-CM

## 2011-01-23 DIAGNOSIS — I4891 Unspecified atrial fibrillation: Secondary | ICD-10-CM

## 2011-01-23 LAB — POCT INR: INR: 1.7

## 2011-01-23 MED ORDER — SULFAMETHOXAZOLE-TMP DS 800-160 MG PO TABS: ORAL_TABLET | ORAL | Status: DC

## 2011-01-23 NOTE — Telephone Encounter (Signed)
Please call in Bactrim DS- 1 tab po bid x 10 days.  No refills.

## 2011-01-23 NOTE — Patient Instructions (Signed)
25mg daily

## 2011-01-23 NOTE — Telephone Encounter (Signed)
Rx sent to [facility], patient advised as instructed via telephone.

## 2011-01-26 ENCOUNTER — Ambulatory Visit (INDEPENDENT_AMBULATORY_CARE_PROVIDER_SITE_OTHER): Payer: 59 | Admitting: Family Medicine

## 2011-01-26 DIAGNOSIS — I4891 Unspecified atrial fibrillation: Secondary | ICD-10-CM

## 2011-01-26 DIAGNOSIS — Z5181 Encounter for therapeutic drug level monitoring: Secondary | ICD-10-CM

## 2011-01-26 DIAGNOSIS — Z7902 Long term (current) use of antithrombotics/antiplatelets: Secondary | ICD-10-CM

## 2011-01-26 LAB — POCT INR: INR: 1.5

## 2011-01-26 NOTE — Patient Instructions (Signed)
2.5 mg daily, 5 mg Tues. Check next INR This Friday, 01/30/11

## 2011-01-27 ENCOUNTER — Ambulatory Visit: Payer: 59

## 2011-01-30 ENCOUNTER — Ambulatory Visit (INDEPENDENT_AMBULATORY_CARE_PROVIDER_SITE_OTHER): Payer: 59 | Admitting: Family Medicine

## 2011-01-30 DIAGNOSIS — I4891 Unspecified atrial fibrillation: Secondary | ICD-10-CM

## 2011-01-30 DIAGNOSIS — Z7902 Long term (current) use of antithrombotics/antiplatelets: Secondary | ICD-10-CM

## 2011-01-30 DIAGNOSIS — Z5181 Encounter for therapeutic drug level monitoring: Secondary | ICD-10-CM

## 2011-01-30 NOTE — Patient Instructions (Signed)
2.5 mg daily, 5 mg Tues. Check next INR 1 week

## 2011-02-06 ENCOUNTER — Ambulatory Visit (INDEPENDENT_AMBULATORY_CARE_PROVIDER_SITE_OTHER): Payer: 59 | Admitting: Family Medicine

## 2011-02-06 DIAGNOSIS — Z7902 Long term (current) use of antithrombotics/antiplatelets: Secondary | ICD-10-CM

## 2011-02-06 DIAGNOSIS — I4891 Unspecified atrial fibrillation: Secondary | ICD-10-CM

## 2011-02-06 DIAGNOSIS — Z5181 Encounter for therapeutic drug level monitoring: Secondary | ICD-10-CM

## 2011-02-06 LAB — POCT INR: INR: 2.1

## 2011-02-06 NOTE — Patient Instructions (Signed)
Continue current dose, check in 2 weeks  

## 2011-02-18 ENCOUNTER — Ambulatory Visit: Payer: Self-pay | Admitting: Family Medicine

## 2011-02-20 ENCOUNTER — Ambulatory Visit (INDEPENDENT_AMBULATORY_CARE_PROVIDER_SITE_OTHER): Payer: 59 | Admitting: Family Medicine

## 2011-02-20 DIAGNOSIS — Z7902 Long term (current) use of antithrombotics/antiplatelets: Secondary | ICD-10-CM

## 2011-02-20 DIAGNOSIS — Z5181 Encounter for therapeutic drug level monitoring: Secondary | ICD-10-CM

## 2011-02-20 DIAGNOSIS — I4891 Unspecified atrial fibrillation: Secondary | ICD-10-CM

## 2011-02-20 LAB — POCT INR: INR: 2.5

## 2011-02-20 NOTE — Patient Instructions (Signed)
Continue current dose, check in 4 weeks  

## 2011-02-24 ENCOUNTER — Encounter: Payer: Self-pay | Admitting: *Deleted

## 2011-02-25 ENCOUNTER — Encounter: Payer: Self-pay | Admitting: Family Medicine

## 2011-03-20 ENCOUNTER — Telehealth: Payer: Self-pay | Admitting: *Deleted

## 2011-03-20 ENCOUNTER — Ambulatory Visit: Payer: 59

## 2011-03-20 LAB — PROTIME-INR

## 2011-03-20 NOTE — Telephone Encounter (Signed)
I did a letter/ orders for pt, will fax it when she calls back with info.

## 2011-03-20 NOTE — Telephone Encounter (Signed)
Pt had to leave town unexpectantely, she needs INR drawn today, but needs an order faxed for it. She will call back with fax info for a local hospital. In the meantime can you write order?

## 2011-03-26 ENCOUNTER — Encounter: Payer: Self-pay | Admitting: Family Medicine

## 2011-03-30 ENCOUNTER — Other Ambulatory Visit: Payer: Self-pay | Admitting: *Deleted

## 2011-03-30 MED ORDER — LEVOTHYROXINE SODIUM 25 MCG PO TABS: 25.0000 ug | ORAL_TABLET | Freq: Every day | ORAL | Status: DC

## 2011-04-09 ENCOUNTER — Ambulatory Visit (INDEPENDENT_AMBULATORY_CARE_PROVIDER_SITE_OTHER): Payer: 59 | Admitting: Family Medicine

## 2011-04-09 ENCOUNTER — Encounter: Payer: Self-pay | Admitting: Family Medicine

## 2011-04-09 VITALS — BP 110/60 | HR 80 | Temp 98.0°F | Wt 184.0 lb

## 2011-04-09 DIAGNOSIS — R05 Cough: Secondary | ICD-10-CM

## 2011-04-09 MED ORDER — BENZONATATE 200 MG PO CAPS: 200.0000 mg | ORAL_CAPSULE | Freq: Three times a day (TID) | ORAL | Status: AC | PRN

## 2011-04-09 NOTE — Progress Notes (Signed)
Recently back from being out of town, daughter had a ruptured appendix.  She's going better.   +sick contacts.  Cough is some better today.   duration of symptoms: 4-5 days Rhinorrhea: some Congestion:some ear pain:no sore throat: with cough Cough:yes.  Minimal sputum Myalgias: no aches but she is fatigued other concerns: the cough was most troubling.   ROS: See HPI.  Otherwise negative.    Meds, vitals, and allergies reviewed.   GEN: nad, alert and oriented HEENT: mucous membranes moist, TM w/o erythema, nasal epithelium mild injected, OP with mild cobblestoning NECK: supple w/o LA CV: rrr. PULM: ctab, no inc wob, dry cough noted EXT: no edema

## 2011-04-09 NOTE — Assessment & Plan Note (Signed)
Improving, likely viral process.  No indication for abx.  Okay for outpatient f/u.  ddx d/w pt and she understood.  Use tessalon prn for cough, supportive tx o/w.  She agrees.

## 2011-04-09 NOTE — Patient Instructions (Signed)
Drink plenty of fluids, take tessalon as needed for cough, and get some rest.  This should gradually improve.  Take care.  Let us know if you have other concerns.

## 2011-04-10 ENCOUNTER — Ambulatory Visit (INDEPENDENT_AMBULATORY_CARE_PROVIDER_SITE_OTHER): Payer: 59 | Admitting: *Deleted

## 2011-04-10 ENCOUNTER — Telehealth: Payer: Self-pay

## 2011-04-10 ENCOUNTER — Inpatient Hospital Stay (HOSPITAL_COMMUNITY)
Admission: EM | Admit: 2011-04-10 | Discharge: 2011-04-17 | DRG: 310 | Disposition: A | Discharge status: Home / Self Care | Payer: 59 | Attending: Cardiology | Admitting: Cardiology

## 2011-04-10 ENCOUNTER — Emergency Department (HOSPITAL_COMMUNITY): Payer: 59

## 2011-04-10 ENCOUNTER — Encounter: Payer: Self-pay | Admitting: *Deleted

## 2011-04-10 VITALS — BP 75/51 | HR 126 | Ht 71.0 in | Wt 181.0 lb

## 2011-04-10 DIAGNOSIS — I472 Ventricular tachycardia, unspecified: Secondary | ICD-10-CM | POA: Diagnosis not present

## 2011-04-10 DIAGNOSIS — Z8673 Personal history of transient ischemic attack (TIA), and cerebral infarction without residual deficits: Secondary | ICD-10-CM

## 2011-04-10 DIAGNOSIS — I4729 Other ventricular tachycardia: Secondary | ICD-10-CM | POA: Diagnosis not present

## 2011-04-10 DIAGNOSIS — R0602 Shortness of breath: Secondary | ICD-10-CM

## 2011-04-10 DIAGNOSIS — T462X5A Adverse effect of other antidysrhythmic drugs, initial encounter: Secondary | ICD-10-CM | POA: Diagnosis not present

## 2011-04-10 DIAGNOSIS — F329 Major depressive disorder, single episode, unspecified: Secondary | ICD-10-CM | POA: Diagnosis present

## 2011-04-10 DIAGNOSIS — Z7901 Long term (current) use of anticoagulants: Secondary | ICD-10-CM

## 2011-04-10 DIAGNOSIS — I4891 Unspecified atrial fibrillation: Secondary | ICD-10-CM

## 2011-04-10 DIAGNOSIS — M1990 Unspecified osteoarthritis, unspecified site: Secondary | ICD-10-CM | POA: Diagnosis present

## 2011-04-10 DIAGNOSIS — Z7982 Long term (current) use of aspirin: Secondary | ICD-10-CM

## 2011-04-10 DIAGNOSIS — F3289 Other specified depressive episodes: Secondary | ICD-10-CM | POA: Diagnosis present

## 2011-04-10 LAB — CBC
Hemoglobin: 16.1 g/dL — ABNORMAL HIGH (ref 12.0–15.0)
MCH: 31 pg (ref 26.0–34.0)
MCHC: 35.2 g/dL (ref 30.0–36.0)
Platelets: 311 10*3/uL (ref 150–400)
RDW: 14.1 % (ref 11.5–15.5)

## 2011-04-10 LAB — BASIC METABOLIC PANEL
Calcium: 9.6 mg/dL (ref 8.4–10.5)
GFR calc Af Amer: >60 mL/min (ref >60)
GFR calc non Af Amer: >60 mL/min (ref >60)
Potassium: 4 mEq/L (ref 3.5–5.1)
Sodium: 138 mEq/L (ref 135–145)

## 2011-04-10 LAB — MRSA PCR SCREENING: MRSA by PCR: NEGATIVE

## 2011-04-10 LAB — PROTIME-INR
INR: 2.14 — ABNORMAL HIGH (ref 0.00–1.49)
Prothrombin Time: 24.3 seconds — ABNORMAL HIGH (ref 11.6–15.2)

## 2011-04-10 NOTE — Telephone Encounter (Signed)
Patient calling with A-Fib, shortness of breath, dizziness and feeling weak.  She took an extra Flecanide 50 mg last night about 9:30 pm with also cardizem 60 mg.  She still feels in A-Fib. Her blood pressure today is 129/100 with heart rate of 58 with her BP machine at home.  Told patient to have husband drive her to the office to get an EKG per Dr. Mariah Milling.

## 2011-04-10 NOTE — Progress Notes (Signed)
Per Dr. Windell Hummingbird order patient is to report to [facility] ER by car escorted by husband (driver) to [facility] ER for admission.

## 2011-04-11 LAB — COMPREHENSIVE METABOLIC PANEL
ALT: 21 U/L (ref 0–35)
Alkaline Phosphatase: 67 U/L (ref 39–117)
BUN: 18 mg/dL (ref 6–23)
CO2: 26 mEq/L (ref 19–32)
Calcium: 9.2 mg/dL (ref 8.4–10.5)
GFR calc Af Amer: >60 mL/min (ref >60)
GFR calc non Af Amer: >60 mL/min (ref >60)
Glucose, Bld: 97 mg/dL (ref 70–99)
Sodium: 139 mEq/L (ref 135–145)
Total Protein: 6.4 g/dL (ref 6.0–8.3)

## 2011-04-11 LAB — TSH: TSH: 2.286 u[IU]/mL (ref 0.350–4.500)

## 2011-04-11 LAB — MAGNESIUM: Magnesium: 2.1 mg/dL (ref 1.5–2.5)

## 2011-04-12 LAB — BASIC METABOLIC PANEL
CO2: 24 mEq/L (ref 19–32)
Calcium: 9.6 mg/dL (ref 8.4–10.5)
Creatinine, Ser: 0.47 mg/dL — ABNORMAL LOW (ref 0.50–1.10)
GFR calc non Af Amer: >60 mL/min (ref >60)
Glucose, Bld: 81 mg/dL (ref 70–99)

## 2011-04-12 LAB — CBC
HCT: 43.3 % (ref 36.0–46.0)
MCHC: 35.1 g/dL (ref 30.0–36.0)
MCV: 87.7 fL (ref 78.0–100.0)
Platelets: 252 10*3/uL (ref 150–400)
RDW: 14 % (ref 11.5–15.5)
WBC: 3.9 10*3/uL — ABNORMAL LOW (ref 4.0–10.5)

## 2011-04-13 ENCOUNTER — Encounter: Payer: Self-pay | Admitting: Cardiovascular Disease

## 2011-04-13 LAB — CBC
MCH: 30.3 pg (ref 26.0–34.0)
MCHC: 34.6 g/dL (ref 30.0–36.0)
MCV: 87.7 fL (ref 78.0–100.0)
Platelets: 243 10*3/uL (ref 150–400)
RDW: 13.5 % (ref 11.5–15.5)

## 2011-04-13 LAB — BASIC METABOLIC PANEL
BUN: 14 mg/dL (ref 6–23)
Calcium: 9 mg/dL (ref 8.4–10.5)
Creatinine, Ser: 0.52 mg/dL (ref 0.50–1.10)
GFR calc non Af Amer: >60 mL/min (ref >60)
Glucose, Bld: 90 mg/dL (ref 70–99)

## 2011-04-13 LAB — PROTIME-INR: Prothrombin Time: 32.5 seconds — ABNORMAL HIGH (ref 11.6–15.2)

## 2011-04-14 ENCOUNTER — Telehealth: Payer: Self-pay | Admitting: *Deleted

## 2011-04-14 LAB — PROTIME-INR
INR: 2.77 — ABNORMAL HIGH (ref 0.00–1.49)
Prothrombin Time: 29.7 s — ABNORMAL HIGH (ref 11.6–15.2)

## 2011-04-14 NOTE — Telephone Encounter (Signed)
Noted thank you

## 2011-04-14 NOTE — Telephone Encounter (Signed)
Called patient per Dr. Elmer Sow request to check on her.  Left message on machine at home for her to return call.

## 2011-04-14 NOTE — Telephone Encounter (Signed)
Patient stated that she has been in [facility] since Friday, in afib since Thursday.  On Friday after a fall her husband called her cardiologist who advised her to go to the hospital.  They are giving patient medication that she will need to be monitored on for three days in the hospital but she cannot recall the name of the medication.  She will touch basis with [place] when she is D/C from the hospital.

## 2011-04-15 LAB — BASIC METABOLIC PANEL
CO2: 29 mEq/L (ref 19–32)
Chloride: 98 mEq/L (ref 96–112)
GFR calc non Af Amer: >60 mL/min (ref >60)
Glucose, Bld: 87 mg/dL (ref 70–99)
Potassium: 4 mEq/L (ref 3.5–5.1)
Sodium: 134 mEq/L — ABNORMAL LOW (ref 135–145)

## 2011-04-16 LAB — BASIC METABOLIC PANEL
BUN: 12 mg/dL (ref 6–23)
Calcium: 9.2 mg/dL (ref 8.4–10.5)
GFR calc Af Amer: >60 mL/min (ref >60)
GFR calc non Af Amer: >60 mL/min (ref >60)
Potassium: 4.3 mEq/L (ref 3.5–5.1)
Sodium: 133 mEq/L — ABNORMAL LOW (ref 135–145)

## 2011-04-16 LAB — PROTIME-INR: Prothrombin Time: 27.4 seconds — ABNORMAL HIGH (ref 11.6–15.2)

## 2011-04-17 ENCOUNTER — Encounter: Payer: 59 | Admitting: *Deleted

## 2011-04-17 LAB — PROTIME-INR: INR: 2.19 — ABNORMAL HIGH (ref 0.00–1.49)

## 2011-04-21 ENCOUNTER — Ambulatory Visit: Payer: 59 | Admitting: Family Medicine

## 2011-04-21 ENCOUNTER — Telehealth: Payer: Self-pay | Admitting: *Deleted

## 2011-04-21 DIAGNOSIS — I4891 Unspecified atrial fibrillation: Secondary | ICD-10-CM

## 2011-04-21 DIAGNOSIS — Z7902 Long term (current) use of antithrombotics/antiplatelets: Secondary | ICD-10-CM

## 2011-04-21 DIAGNOSIS — Z5181 Encounter for therapeutic drug level monitoring: Secondary | ICD-10-CM

## 2011-04-21 NOTE — Telephone Encounter (Signed)
Patient came in today for labs and wanted her BP checked.  BP was 102/ 64 and patient stated that she was feeling fine, no headaches, no dizziness, no SOB, or fatigue.  Advised patient to call and schedule hospital follow up with [name] at her convenience.  Was advised by Aram Beecham to document this in patients chart instead of on paper.  Please advise.

## 2011-04-21 NOTE — Patient Instructions (Signed)
Continue 2.5 mg daily, 5 mg Tues, recheck 4 weeks

## 2011-04-21 NOTE — Telephone Encounter (Signed)
Noted and I agreed with plan and signed off on it. Thank you for documenting it in Epic.

## 2011-04-23 ENCOUNTER — Telehealth: Payer: Self-pay | Admitting: Internal Medicine

## 2011-04-23 NOTE — Telephone Encounter (Signed)
Pt in now in sinus rhythm and needs to know what to do

## 2011-04-23 NOTE — Telephone Encounter (Signed)
Spoke with patient and let her know that it is not uncommon to go in and out of rhythm She said it was tolerable last night I told her to continue all her current medications and I would forward this to Dr Johney Frame to see if he  has any further recommendations for when she goes out of rhythm  I explained to her that with her low BP we may be very limited because if we give her extra medications to slow the HR down then her BP may drop too low  She runs 90's systolic and 60's diastolic  It sounds like this episode her rate was controlled

## 2011-04-24 ENCOUNTER — Encounter: Payer: Self-pay | Admitting: *Deleted

## 2011-04-24 ENCOUNTER — Ambulatory Visit (INDEPENDENT_AMBULATORY_CARE_PROVIDER_SITE_OTHER): Payer: 59 | Admitting: *Deleted

## 2011-04-24 VITALS — BP 98/68 | HR 62 | Ht 71.0 in | Wt 187.0 lb

## 2011-04-24 DIAGNOSIS — I4891 Unspecified atrial fibrillation: Secondary | ICD-10-CM

## 2011-04-24 NOTE — Patient Instructions (Addendum)
The patient came in today for an EKG follow up from [place]. She had no problems today except for her blood pressure running low.  Her blood pressure as you can see was 90/62 with heart rate of 62 and checked again before patient leaving the office 98/68 with heart rate of 62.  The patient is to monitor her blood pressure and heart rate and bring the readings to the office on Thursday for review.  She also has a follow up with Dr. Johney Frame in [place] in Oct. 2012.  The patient was also given instructions to increase in her salt intake to help increase her blood pressure.

## 2011-04-25 NOTE — Telephone Encounter (Signed)
She has failed flecainide and tikosyn.  Presently, she has few options.  I would continue multaq for now.  I would like to avoid amiodarone if possibly.  She previously has declined catheter ablation when offered.  No changes at this time.  If she wishes to consider ablation, we can schedule, otherwise I will see her as scheduled for follow-up.

## 2011-04-25 NOTE — Discharge Summary (Addendum)
Monica Whitaker, Monica Whitaker NO.:  [id number]  MEDICAL RECORD NO.:  [id number]  LOCATION:  3715                         FACILITY:  [facility]  PHYSICIAN:  Hillis Range, MD       DATE OF BIRTH:  Nov 25, 1945  DATE OF ADMISSION:  04/10/2011 DATE OF DISCHARGE:  04/17/2011                              DISCHARGE SUMMARY   DISCHARGE DIAGNOSES: 1. Paroxysmal atrial fibrillation with recurrence this admission.     a.     Recurrence occurred on flecainide, which was discontinued.     b.     Tikosyn initiated, but discontinued secondary to      nonsustained ventricular tachycardia.     c.     Anticoagulated with Coumadin.     d.     Initiated on [place] at discharge. 2. Nonsustained ventricular tachycardia, occurred while on Tikosyn. 3. Normal left ventricular function with ejection fraction of 60%-65%     by echocardiogram, April 10, 2011. 4. History of cerebrovascular accident in May 2009, after medical     cardioversion to sinus rhythm. 5. Depression. 6. Osteoarthritis. 7. History of abnormal pancreas with dilated pancreatic duct. 8. Status post eye surgery. 9. Status post colonoscopy. 10.Status post hysterectomy.  HOSPITAL COURSE:  Monica Whitaker is a 65 year old female with a history of PAF, who presented to [facility] with complaints of tachy palpitations.  They were associated with weakness, dyspnea on exertion, and presyncope.  She had taken an extra flecainide the night prior to admission because of these palpitations, but had recurrence on day of admission.  She called the office and came by there to be checked.  Her blood pressure was noted to be low at 75/31 and heart rate was 126.  She was sent to the ER where she received 20 mg IV Cardizem bolus and 10 mg an hour drip with heart rate in the 110s-130s at the time of Cardiology evaluation with a blood pressure right at 100 systolic.  She was feeling better with the heart rate in the 110s.  Flecainide was  discontinued. She was therapeutic on her Coumadin and there was consideration to initiate Tikosyn following a period of flecainide washout.  A 2-D echocardiogram was obtained showing normal LV function.  Flecainide was initiated on April 13, 2011, however, on April 16, 2011, the patient developed nonsustained VT which was felt secondary to the Tikosyn.  Her electrolytes were normal.  This was discontinued.  She was observed with no further VT on telemetry.  On day of discharge, she is feeling well.  She is in sinus rhythm.  Dr. Johney Frame would like to initiate her on Multaq with her first dose on Sunday with followup in the office.  Dr. Johney Frame has seen and examined her today and feels she is stable for discharge.  DISCHARGE LABORATORY DATA:  Sodium 133, potassium 4.3, chloride 97, CO2 of 28, glucose 89, BUN 12, creatinine 0.51, magnesium 2.1.  TSH 2.286.  STUDIES: 1. Chest x-ray on April 10, 2011, showed no acute cardiopulmonary     findings. 2. A 2-D echocardiogram on April 10, 2011, showed normal LV wall     thickness.  Systolic function was  normal with an EF of 60%-65%.  No     significant valvular disease.  DISCHARGE MEDICATIONS: 1. Multaq 400 mg b.i.d. with instructions to start on from April 19, 2011. 2. Diltiazem CD 180 mg daily which is a new dose and prescription,     increased from prior. 3. Coumadin 2.5 mg.  Per pharmacy, they would like her to take 1     tablet daily over the weekend, then take 2 tablets on Monday and     have her INR checked on Tuesday. 4. Aspirin 81 mg every morning. 5. Calcium carbonate/vitamin D OTC 2 tablets every morning. 6. Dextroamphetamine 5 mg every morning. 7. Fish oil OTC 1 capsule every morning. 8. Folic acid 1 tablet every morning. 9. Glucosamine chondroitin 2 capsules every morning. 10.Multivitamin 1 tablet daily. 11.Prozac 20 mg 3 capsules daily. 12.Synthroid 25 mcg every morning. 13.Vitamin B complex 1 tablet  every morning. 14.Vitamin E tablet every evening.  Her flecainide will be discontinued this admission.  DISPOSITION:  Monica Whitaker will be discharged in stable condition to home. She is instructed to increase her activity slowly and follow a low- sodium, heart-healthy diet.  She will have her INR checked at [facility] on April 21, 2011, at 12 p.m.  She will get an EKG to look at her QT interval and heart rhythm at [facility] on April 24, 2011 at 2 p.m.  She will follow up with Dr. Johney Frame on May 20, 2011, at 9:30 a.m.  DURATION OF DISCHARGE ENCOUNTER:  Greater than 30 minutes including physician and PA time.     Ronie Spies, P.A.C.   ______________________________ Hillis Range, MD    DD/MEDQ  D:  04/17/2011  T:  04/17/2011  Job:  [id number]  cc:   Dr. Clifton Custard Dr. Mariah Milling  Electronically Signed by Ronie Spies  on 04/25/2011 08:53:21 AM Electronically Signed by Hillis Range MD on 05/07/2011 08:41:58 AM

## 2011-04-27 NOTE — Telephone Encounter (Signed)
Will keep follow up appointment as ascheduled

## 2011-04-28 ENCOUNTER — Telehealth: Payer: Self-pay | Admitting: Internal Medicine

## 2011-04-28 NOTE — Telephone Encounter (Signed)
Spoke with patient and let her know again Dr Amedeo Plenty recommendations  She verbalized understanding and will keep her follow up appointment as scheduled  She still does not want to pursue ablation for financial reasons  I have explained to her that if the episodes become more frequent and she can not tolerate to please call me as we would have to make a change of some sort at that time

## 2011-04-28 NOTE — Telephone Encounter (Signed)
Pt calling requested information about what happens if pt breaks through new medication. Is pt suppose to take more medication or what. Pt said she went into a-fib twice since d/c from the hospital. Pt would like to know if she takes more medication when she goes into a-fib. Please leave msg on pt home or cell number.   Cell [phone]

## 2011-05-01 NOTE — Telephone Encounter (Signed)
The patient came in for EKG & blood pressure checked.  She was then told to have husband take her to [facility] for evaluation.  The patient did not want to go by EMS.

## 2011-05-14 NOTE — H&P (Signed)
Monica Whitaker, Monica Whitaker NO.:  [id number]  MEDICAL RECORD NO.:  [id number]  LOCATION:  2919                         FACILITY:  [facility]  PHYSICIAN:  Rollene Rotunda, MD, FACCDATE OF BIRTH:  1945-10-06  DATE OF ADMISSION:  04/10/2011 DATE OF DISCHARGE:                             HISTORY & PHYSICAL   PRIMARY CARE PHYSICIAN:  Dr. Denice Paradise, [place].  PRIMARY CARDIOLOGIST:  Dr. Mariah Milling.  CHIEF COMPLAINT:  Atrial fibrillation with RVR.  HISTORY OF PRESENT ILLNESS:  Monica Whitaker is a 65 year old female with a history of atrial fibrillation.  She has had multiple breakthroughs in the last few weeks and reports increased social stress, but is compliant with her medications and her Coumadin.  She had onset last p.m. of tachy palpitations.  She took an extra flecainide 50 mg as well as short- acting Cardizem 60 mg which had been prescribed on a p.r.n. basis.  She had a few symptoms and was able to sleep.  During her last few episodes of AFib, she has done that and felt that she was back in sinus rhythm the following morning.  However, at this time she woke up and was still in AFib and became progressively more symptomatic with weakness, dyspnea on exertion, and presyncope.  She was lightheaded and dizzy and fell in the bathroom, sitting down hard.  She denies injuries from this.  She did not completely lose consciousness.  She called the office and came by there to be checked.  Her blood pressure was low at 75/51 and her heart rate was 126.  She was sent to the emergency room.  In the emergency room, she had at the Cardizem 20 mg IV bolus and 10 mg an hour.  Currently at rest her heart rate is in the 110s to 130s.  Her systolic blood pressure is right at 100.  Currently even with a heart rate greater than 110, she is denying symptoms.  PAST MEDICAL HISTORY: 1. History of paroxysmal atrial fibrillation approximately 20 years. 2. History of CVA in May 2009 after  medical cardioversion to sinus     rhythm. 3. Chronic anticoagulation, INR in June 29 2.1, July 6 2.1, July 20     2.5, August 17 2.5, and September 7 2.14. 4. Depression. 5. Osteoarthritis. 6. History of an abnormal pancreas with dilated pancreatic duct.  SURGICAL HISTORY:  She is status post eye surgery, colonoscopy, and hysterectomy.  ALLERGIES:  She is allergic or intolerant to [facility], DARVOCET, DARVON.  CURRENT MEDICATIONS:  Per med list from the office today. 1. Aspirin 81 mg a day. 2. Tessalon Perles 200 mg t.i.d. p.r.n. 3. Os-Cal 600 b.i.d. 4. Vitamin D 1000 units daily. 5. Klonopin 1 mg, 2-1/2 tablets daily. 6. Flexeril 5 mg q.8 h. p.r.n. 7. Dextrostat 5 mg daily. 8. Cardizem CD 120 mg a day. 9. Flecainide 100 mg b.i.d. 10.Prozac 40 mg 3 tablets daily. 11._______ 1 mg a day. 12.Glucosamine and chondroitin once or twice a day. 13.Synthroid 25 mcg daily. 14.Multivitamin daily. 15.Vitamin E 400 units daily. 16.Coumadin 5 mg daily or as directed. 17.Short-acting Cardizem 60 mg p.r.n.  SOCIAL HISTORY:  She lives in [place] with her husband.  She is retired.  She has no history of alcohol, tobacco, or drug abuse.  She tries to stay active.  FAMILY HISTORY:  Mother died at 3 of a motor vehicle accident.  Her father died at 47, neither one with cardiac history.  No siblings have cardiac history.  REVIEW OF SYSTEMS:  She complains of fatigue.  She feels weak.  She has chronic arthralgias and joint pains.  She denies any problems with the Coumadin except for once when her INR was very high.  She has chronic lower extremity daytime edema.  She has been coughing but it is a dry cough.  Her stepdaughter had a ruptured appendix in [place] and they were visiting up there.  They have been back for a week and she has had a cold since then, but no fevers or chills and the cough is nonproductive.  Full 14-point review of systems is otherwise negative except as stated  in the HPI.  PHYSICAL EXAMINATION:  VITAL SIGNS:  Temperature 97.5, blood pressure 107/78, heart rate 108, respiratory rate 14, and O2 saturation 98% on room air. GENERAL:  She is a well-developed elderly white female in no acute distress. HEENT:  Normal for age. NECK:  There is no lymphadenopathy, thyromegaly, bruit, or JVD noted. CV:  Heart is irregular in rate and rhythm with an S1 and S2 with no significant murmur, rub, or gallop is noted.  Distal pulses are intact in all four extremities. LUNGS:  She has rales bilaterally, but no crackles or wheeze are noted. SKIN:  No rashes or lesions are noted. ABDOMEN:  Soft and nontender with active bowel sounds. EXTREMITIES:  There is no cyanosis, clubbing, or edema noted. MUSCULOSKELETAL:  There is no joint deformity or effusions.  No spine or CVA tenderness. NEURO:  She is alert and oriented with cranial nerves II-XII are grossly intact.  She admits to poor memory, but has no obvious focal physical deficit.  Chest x-ray, no acute disease.  EKG is AFib, rate 125 with no acute ischemic changes.  LABORATORY VALUES:  Hemoglobin 16.1, hematocrit 45.8, WBC is 5.1,platelets 311, INR 2.14, sodium 138, potassium 4.0, chloride 101, CO2 of 25, BUN 20, creatinine 0.61, and glucose 97.  IMPRESSION:  Monica Whitaker was seen today by Dr. Antoine Poche, the patient was evaluated and the data reviewed.  She is a 65 year old female with increasing episodes of PAF despite maximum dose to flecainide.  Now she is here with AFib and hypotension associated with presyncope and a fall. Her symptoms are improved now but she still in rapid atrial fibrillation.  She has had no chest pain.  She has a previous cerebrovascular accident.  PLAN:  AFib:  We will stop flecainide.  Dr. Mariah Milling, has been contacted and is aware of this plan.  He is in agreement.  We will check an echocardiogram which has been ordered.  Tikosyn can be considered after appropriate wash out of the  flecainide.  We will check a magnesium level and follow her potassium level as well.     Theodore Demark, PA-C   ______________________________ Rollene Rotunda, MD, [facility]    RB/MEDQ  D:  04/10/2011  T:  04/11/2011  Job:  [id number]  Electronically Signed by Theodore Demark PA-C on 05/11/2011 06:51:03 AM Electronically Signed by Rollene Rotunda MD [facility] on 05/14/2011 01:37:42 PM

## 2011-05-19 ENCOUNTER — Ambulatory Visit: Payer: 59

## 2011-05-20 ENCOUNTER — Encounter: Payer: Self-pay | Admitting: Internal Medicine

## 2011-05-20 ENCOUNTER — Ambulatory Visit (INDEPENDENT_AMBULATORY_CARE_PROVIDER_SITE_OTHER): Payer: 59 | Admitting: Internal Medicine

## 2011-05-20 VITALS — BP 104/64 | HR 62 | Ht 71.0 in | Wt 179.0 lb

## 2011-05-20 DIAGNOSIS — I4891 Unspecified atrial fibrillation: Secondary | ICD-10-CM

## 2011-05-20 NOTE — Progress Notes (Signed)
The patient presents today for routine electrophysiology followup.  Since last being seen in our clinic, the patient reports doing very well.  She has had only 2 short episodes of afib (1 hour) since starting multaq.  She is pleased with her present condition. Today, she denies symptoms of palpitations, chest pain, shortness of breath, orthopnea, PND, lower extremity edema, dizziness, presyncope, syncope, or neurologic sequela.  The patient feels that she is tolerating medications without difficulties and is otherwise without complaint today.   Past Medical History  Diagnosis Date  . Anxiety   . Depression   . CVA (cerebral vascular accident) 12/2007  . Ventricular tachycardia     pt told at [facility] that she had RVOT VT  . Atrial fibrillation     paroxysmal, failed medical therapy with flecainide,  NSVT with tikosyn   Past Surgical History  Procedure Date  . Vaginal hysterectomy   . Tonsillectomy     Current Outpatient Prescriptions  Medication Sig Dispense Refill  . aspirin 81 MG tablet Take 81 mg by mouth daily.        . calcium carbonate (OS-CAL) 600 MG TABS Take 600 mg by mouth 2 (two) times daily with a meal.        . cholecalciferol (VITAMIN D) 1000 UNITS tablet Take 1,000 Units by mouth daily.        . clonazePAM (KLONOPIN) 1 MG tablet 2.5mg  daily       . dextroamphetamine (DEXTROSTAT) 5 MG tablet Take 5 mg by mouth daily.        Marland Kitchen diltiazem (CARDIZEM CD) 180 MG 24 hr capsule Take 180 mg by mouth daily.        Marland Kitchen dronedarone (MULTAQ) 400 MG tablet Take 400 mg by mouth 2 (two) times daily with a meal.        . FLUoxetine (PROZAC) 40 MG capsule 40 mg. Take three tablets daily.      Marland Kitchen glucosamine-chondroitin 500-400 MG tablet Take 1-2 by mouth daily       . levothyroxine (SYNTHROID, LEVOTHROID) 25 MCG tablet Take 1 tablet (25 mcg total) by mouth daily.  90 tablet  3  . Multiple Vitamin (MULTIVITAMIN) tablet Take 1 tablet by mouth daily.        . vitamin E 400 UNIT capsule Take 400  Units by mouth daily.        Marland Kitchen warfarin (COUMADIN) 2.5 MG tablet Take as directed       . warfarin (COUMADIN) 5 MG tablet Take 5 mg by mouth daily.         Allergies  Allergen Reactions  . Darvon   . Levofloxacin   . Propoxyphene N-Acetaminophen     History   Social History  . Marital Status: Married    Spouse Name: N/A    Number of Children: 0  . Years of Education: N/A   Occupational History  . Retired    Social History Main Topics  . Smoking status: Never Smoker   . Smokeless tobacco: Never Used  . Alcohol Use: 0.5 oz/week    1 drink(s) per week  . Drug Use: No  . Sexually Active: Not on file   Other Topics Concern  . Not on file   Social History Narrative   Lives in [place] with her husband.  No children.  Former Retail buyer    Family History  Problem Relation Age of Onset  . Multiple sclerosis Father     Physical Exam: Filed Vitals:  05/20/11 0943  BP: 104/64  Pulse: 62  Height: 5\' 11"  (1.803 m)  Weight: 179 lb (81.194 kg)    GEN- The patient is well appearing, alert and oriented x 3 today.   Head- normocephalic, atraumatic Eyes-  Sclera clear, conjunctiva pink Ears- hearing intact Oropharynx- clear Neck- supple, no JVP Lymph- no cervical lymphadenopathy Lungs- Clear to ausculation bilaterally, normal work of breathing Heart- Regular rate and rhythm, no murmurs, rubs or gallops, PMI not laterally displaced GI- soft, NT, ND, + BS Extremities- no clubbing, cyanosis, or edema MS- no significant deformity or atrophy Skin- no rash or lesion Psych- euthymic mood, full affect Neuro- strength and sensation are intact  ekg today reveals sinus rhythm58 bpm, otherwise normal ekg, Qtc 435  Assessment and Plan:

## 2011-05-20 NOTE — Assessment & Plan Note (Signed)
Doing very well on multaq.  She has failed medical therapy with Ics and had NSVT with tikosyn. Continue multaq.  She and I have agreed to avoid ablation unless afib worsens.  Continue coumadin,  Stop ASA

## 2011-05-20 NOTE — Patient Instructions (Signed)
Your physician recommends that you schedule a follow-up appointment in 3 months with Dr Allred    

## 2011-05-21 ENCOUNTER — Ambulatory Visit (INDEPENDENT_AMBULATORY_CARE_PROVIDER_SITE_OTHER): Payer: 59 | Admitting: Family Medicine

## 2011-05-21 ENCOUNTER — Encounter: Payer: Self-pay | Admitting: Family Medicine

## 2011-05-21 VITALS — BP 108/66 | HR 76 | Temp 98.2°F | Ht 71.0 in | Wt 181.2 lb

## 2011-05-21 DIAGNOSIS — R350 Frequency of micturition: Secondary | ICD-10-CM

## 2011-05-21 DIAGNOSIS — N390 Urinary tract infection, site not specified: Secondary | ICD-10-CM

## 2011-05-21 DIAGNOSIS — Z23 Encounter for immunization: Secondary | ICD-10-CM

## 2011-05-21 DIAGNOSIS — Z7901 Long term (current) use of anticoagulants: Secondary | ICD-10-CM

## 2011-05-21 DIAGNOSIS — Z5181 Encounter for therapeutic drug level monitoring: Secondary | ICD-10-CM

## 2011-05-21 LAB — POCT INR: INR: 3

## 2011-05-21 LAB — POCT URINALYSIS DIPSTICK
Glucose, UA: NEGATIVE
Spec Grav, UA: 1.02
Urobilinogen, UA: 0.2

## 2011-05-21 MED ORDER — CEPHALEXIN 500 MG PO CAPS: 500.0000 mg | ORAL_CAPSULE | Freq: Two times a day (BID) | ORAL | Status: DC

## 2011-05-21 NOTE — Progress Notes (Signed)
Addended by: Alvina Chou on: 05/21/2011 12:57 PM   Modules accepted: Orders

## 2011-05-21 NOTE — Patient Instructions (Signed)
Continue 2.5 mg daily,  recheck tues

## 2011-05-21 NOTE — Progress Notes (Signed)
SUBJECTIVE: Monica Whitaker is a 65 y.o. female who complains of urinary frequency, urgency and dysuria x 1 day, without flank pain, fever, chills, or abnormal vaginal discharge or bleeding.   Patient Active Problem List  Diagnoses  . HYPOTHYROIDISM  . ANXIETY DEPRESSION  . ATRIAL FIBRILLATION  . CEREBRAL EMBOLISM WITHOUT MENTION INFARCT  . MICROSCOPIC HEMATURIA  . MYALGIA  . DIZZINESS  . FEVER UNSPECIFIED  . FATIGUE / MALAISE  . PALPITATIONS  . Neck pain  . Edema  . Cough   Past Medical History  Diagnosis Date  . Anxiety   . Depression   . CVA (cerebral vascular accident) 12/2007  . Ventricular tachycardia     pt told at [facility] that she had RVOT VT  . Atrial fibrillation     paroxysmal, failed medical therapy with flecainide,  NSVT with tikosyn   Past Surgical History  Procedure Date  . Vaginal hysterectomy   . Tonsillectomy    History  Substance Use Topics  . Smoking status: Never Smoker   . Smokeless tobacco: Never Used  . Alcohol Use: 0.5 oz/week    1 drink(s) per week   Family History  Problem Relation Age of Onset  . Multiple sclerosis Father    Allergies  Allergen Reactions  . Darvon   . Levofloxacin   . Propoxyphene N-Acetaminophen    Current Outpatient Prescriptions on File Prior to Visit  Medication Sig Dispense Refill  . calcium carbonate (OS-CAL) 600 MG TABS Take 600 mg by mouth 2 (two) times daily with a meal.        . cholecalciferol (VITAMIN D) 1000 UNITS tablet Take 1,000 Units by mouth daily.        . clonazePAM (KLONOPIN) 1 MG tablet 2.5mg  daily       . dextroamphetamine (DEXTROSTAT) 5 MG tablet Take 5 mg by mouth daily.        Marland Kitchen diltiazem (CARDIZEM CD) 180 MG 24 hr capsule Take 180 mg by mouth daily.        Marland Kitchen dronedarone (MULTAQ) 400 MG tablet Take 400 mg by mouth 2 (two) times daily with a meal.        . FLUoxetine (PROZAC) 40 MG capsule 40 mg. Take three tablets daily.      Marland Kitchen glucosamine-chondroitin 500-400 MG tablet Take 1-2 by mouth daily        . levothyroxine (SYNTHROID, LEVOTHROID) 25 MCG tablet Take 1 tablet (25 mcg total) by mouth daily.  90 tablet  3  . Multiple Vitamin (MULTIVITAMIN) tablet Take 1 tablet by mouth daily.        . vitamin E 400 UNIT capsule Take 400 Units by mouth daily.        Marland Kitchen warfarin (COUMADIN) 2.5 MG tablet Take as directed       . warfarin (COUMADIN) 5 MG tablet Take 5 mg by mouth daily.       Marland Kitchen aspirin 81 MG tablet Take 81 mg by mouth daily.         The PMH, PSH, Social History, Family History, Medications, and allergies have been reviewed in [facility], and have been updated if relevant.  OBJECTIVE:  BP 108/66  Pulse 76  Temp(Src) 98.2 F (36.8 C) (Oral)  Ht 5\' 11"  (1.803 m)  Wt 181 lb 4 oz (82.214 kg)  BMI 25.28 kg/m2 Appears well, in no apparent distress.  Vital signs are normal. The abdomen is soft without tenderness, guarding, mass, rebound or organomegaly. No CVA tenderness or inguinal adenopathy  noted. Urine dipstick shows positive for RBC's, positive for protein, positive for nitrates and positive for leukocytes.   Allergic to cipro  ASSESSMENT: UTI uncomplicated without evidence of pyelonephritis  PLAN: Treatment per orders - Keflex 500 mg twice daily x 7 days.  also push fluids, may use Pyridium OTC prn. Call or return to clinic prn if these symptoms worsen or fail to improve as anticipated.

## 2011-05-21 NOTE — Patient Instructions (Signed)
Good to see you. Please make sure that we check your coumadin again next week as antibiotics can affect your coumadin levels.

## 2011-05-24 LAB — URINE CULTURE: Colony Count: >=100000

## 2011-05-26 ENCOUNTER — Ambulatory Visit (INDEPENDENT_AMBULATORY_CARE_PROVIDER_SITE_OTHER): Payer: 59 | Admitting: Family Medicine

## 2011-05-26 DIAGNOSIS — Z5181 Encounter for therapeutic drug level monitoring: Secondary | ICD-10-CM

## 2011-05-26 DIAGNOSIS — Z7902 Long term (current) use of antithrombotics/antiplatelets: Secondary | ICD-10-CM

## 2011-05-26 DIAGNOSIS — I4891 Unspecified atrial fibrillation: Secondary | ICD-10-CM

## 2011-05-26 LAB — POCT INR: INR: 2.6

## 2011-05-26 NOTE — Patient Instructions (Signed)
Continue current dose, check in 4 weeks  

## 2011-06-23 ENCOUNTER — Ambulatory Visit (INDEPENDENT_AMBULATORY_CARE_PROVIDER_SITE_OTHER): Payer: 59 | Admitting: Family Medicine

## 2011-06-23 DIAGNOSIS — Z7902 Long term (current) use of antithrombotics/antiplatelets: Secondary | ICD-10-CM

## 2011-06-23 DIAGNOSIS — Z5181 Encounter for therapeutic drug level monitoring: Secondary | ICD-10-CM

## 2011-06-23 DIAGNOSIS — I4891 Unspecified atrial fibrillation: Secondary | ICD-10-CM

## 2011-06-23 LAB — POCT INR: INR: 1.9

## 2011-06-23 NOTE — Patient Instructions (Signed)
Continue  2.5 mg daily recheck 2 weeks 

## 2011-07-02 ENCOUNTER — Other Ambulatory Visit: Payer: Self-pay | Admitting: Family Medicine

## 2011-07-06 ENCOUNTER — Ambulatory Visit (INDEPENDENT_AMBULATORY_CARE_PROVIDER_SITE_OTHER): Payer: 59 | Admitting: Family Medicine

## 2011-07-06 ENCOUNTER — Encounter: Payer: Self-pay | Admitting: Family Medicine

## 2011-07-06 VITALS — BP 102/70 | HR 68 | Temp 98.2°F | Ht 71.0 in | Wt 176.8 lb

## 2011-07-06 DIAGNOSIS — J069 Acute upper respiratory infection, unspecified: Secondary | ICD-10-CM

## 2011-07-06 DIAGNOSIS — Z7902 Long term (current) use of antithrombotics/antiplatelets: Secondary | ICD-10-CM

## 2011-07-06 DIAGNOSIS — Z5181 Encounter for therapeutic drug level monitoring: Secondary | ICD-10-CM

## 2011-07-06 DIAGNOSIS — I4891 Unspecified atrial fibrillation: Secondary | ICD-10-CM

## 2011-07-06 MED ORDER — DOXYCYCLINE HYCLATE 100 MG PO TABS: 100.0000 mg | ORAL_TABLET | Freq: Two times a day (BID) | ORAL | Status: AC

## 2011-07-06 MED ORDER — CHLORPHENIRAMINE-HYDROCODONE 8-10 MG/5ML PO LQCR: 5.0000 mL | Freq: Two times a day (BID) | ORAL | Status: DC | PRN

## 2011-07-06 NOTE — Progress Notes (Signed)
SUBJECTIVE:  Monica Whitaker is a 65 y.o. female who complains of coryza, congestion, sneezing, sore throat and fever for 4 days. She denies a history of anorexia, chest pain and wheezing and denies a history of asthma. Patient denies smoke cigarettes.   Tmax 101, has been afebrile since last night. H/o intermittent afib.  She did have her flu shot this year. Previous h/o PNA.  Patient Active Problem List  Diagnoses  . HYPOTHYROIDISM  . ANXIETY DEPRESSION  . ATRIAL FIBRILLATION  . CEREBRAL EMBOLISM WITHOUT MENTION INFARCT  . MICROSCOPIC HEMATURIA  . MYALGIA  . DIZZINESS  . FEVER UNSPECIFIED  . FATIGUE / MALAISE  . PALPITATIONS  . Neck pain  . Edema  . Cough   Past Medical History  Diagnosis Date  . Anxiety   . Depression   . CVA (cerebral vascular accident) 12/2007  . Ventricular tachycardia     pt told at [facility] that she had RVOT VT  . Atrial fibrillation     paroxysmal, failed medical therapy with flecainide,  NSVT with tikosyn   Past Surgical History  Procedure Date  . Vaginal hysterectomy   . Tonsillectomy    History  Substance Use Topics  . Smoking status: Never Smoker   . Smokeless tobacco: Never Used  . Alcohol Use: 0.5 oz/week    1 drink(s) per week   Family History  Problem Relation Age of Onset  . Multiple sclerosis Father    Allergies  Allergen Reactions  . Darvon   . Levofloxacin   . Propoxyphene N-Acetaminophen    Current Outpatient Prescriptions on File Prior to Visit  Medication Sig Dispense Refill  . calcium carbonate (OS-CAL) 600 MG TABS Take 600 mg by mouth 2 (two) times daily with a meal.        . cholecalciferol (VITAMIN D) 1000 UNITS tablet Take 1,000 Units by mouth daily.        . clonazePAM (KLONOPIN) 1 MG tablet 2.5mg  daily       . dextroamphetamine (DEXTROSTAT) 5 MG tablet Take 5 mg by mouth daily.        Marland Kitchen diltiazem (CARDIZEM CD) 180 MG 24 hr capsule Take 180 mg by mouth daily.        Marland Kitchen dronedarone (MULTAQ) 400 MG tablet Take 400  mg by mouth 2 (two) times daily with a meal.        . FLUoxetine (PROZAC) 40 MG capsule 40 mg. Take three tablets daily.      Marland Kitchen glucosamine-chondroitin 500-400 MG tablet Take 1-2 by mouth daily       . levothyroxine (SYNTHROID, LEVOTHROID) 25 MCG tablet Take 1 tablet (25 mcg total) by mouth daily.  90 tablet  3  . Multiple Vitamin (MULTIVITAMIN) tablet Take 1 tablet by mouth daily.        . vitamin E 400 UNIT capsule Take 400 Units by mouth daily.        Marland Kitchen warfarin (COUMADIN) 2.5 MG tablet Take as directed       . warfarin (COUMADIN) 5 MG tablet Take 5 mg by mouth daily.        The PMH, PSH, Social History, Family History, Medications, and allergies have been reviewed in [facility], and have been updated if relevant.  OBJECTIVE: BP 102/70  Pulse 68  Temp(Src) 98.2 F (36.8 C) (Oral)  Ht 5\' 11"  (1.803 m)  Wt 176 lb 12 oz (80.173 kg)  BMI 24.65 kg/m2  She appears well, vital signs are as noted. Ears normal.  Throat and pharynx normal.  Neck supple. No adenopathy in the neck. Nose is congested. Sinuses non tender. The chest is clear, without wheezes or rales. CVS:  Irregularly irregular  ASSESSMENT:  viral upper respiratory illness  PLAN: Given her h/o afib and PNA, will treat with doxy to prevent post viral infection. On Multaq, so will avoid zpack.  Treat with Doxycycline and have her follow up next week for repeat INR. Symptomatic therapy suggested: push fluids, rest and return office visit prn if symptoms persist or worsen.  Call or return to clinic prn if these symptoms worsen or fail to improve as anticipated.

## 2011-07-06 NOTE — Patient Instructions (Signed)
Please come in approx 8 days to get your coumadin rechecked. Take antibiotic as directed.  Drink lots of fluids.  Treat sympotmatically with Mucinex, nasal saline irrigation, and Tylenol.Cough suppressant at night. Call if not improving as expected in 5-7 days.

## 2011-07-06 NOTE — Patient Instructions (Signed)
Continue 2.5 mg daily recheck 8 days

## 2011-07-07 ENCOUNTER — Ambulatory Visit: Payer: 59

## 2011-07-14 ENCOUNTER — Ambulatory Visit (INDEPENDENT_AMBULATORY_CARE_PROVIDER_SITE_OTHER): Payer: 59 | Admitting: Family Medicine

## 2011-07-14 DIAGNOSIS — I4891 Unspecified atrial fibrillation: Secondary | ICD-10-CM

## 2011-07-14 DIAGNOSIS — Z5181 Encounter for therapeutic drug level monitoring: Secondary | ICD-10-CM

## 2011-07-14 DIAGNOSIS — Z7902 Long term (current) use of antithrombotics/antiplatelets: Secondary | ICD-10-CM

## 2011-07-14 NOTE — Patient Instructions (Signed)
Continue current dose, check in 4 weeks  

## 2011-07-15 ENCOUNTER — Other Ambulatory Visit: Payer: Self-pay | Admitting: Internal Medicine

## 2011-07-15 ENCOUNTER — Encounter: Payer: Self-pay | Admitting: Family Medicine

## 2011-07-15 ENCOUNTER — Ambulatory Visit (INDEPENDENT_AMBULATORY_CARE_PROVIDER_SITE_OTHER): Payer: 59 | Admitting: Family Medicine

## 2011-07-15 ENCOUNTER — Ambulatory Visit (INDEPENDENT_AMBULATORY_CARE_PROVIDER_SITE_OTHER)
Admission: RE | Admit: 2011-07-15 | Discharge: 2011-07-15 | Disposition: A | Discharge status: Home / Self Care | Payer: 59 | Source: Ambulatory Visit | Attending: Family Medicine | Admitting: Family Medicine

## 2011-07-15 VITALS — BP 100/60 | HR 58 | Temp 97.9°F | Ht 71.0 in | Wt 181.4 lb

## 2011-07-15 DIAGNOSIS — M79674 Pain in right toe(s): Secondary | ICD-10-CM

## 2011-07-15 DIAGNOSIS — S92919A Unspecified fracture of unspecified toe(s), initial encounter for closed fracture: Secondary | ICD-10-CM

## 2011-07-15 DIAGNOSIS — R209 Unspecified disturbances of skin sensation: Secondary | ICD-10-CM

## 2011-07-15 DIAGNOSIS — M79609 Pain in unspecified limb: Secondary | ICD-10-CM

## 2011-07-15 DIAGNOSIS — S9030XA Contusion of unspecified foot, initial encounter: Secondary | ICD-10-CM

## 2011-07-15 DIAGNOSIS — S92403A Displaced unspecified fracture of unspecified great toe, initial encounter for closed fracture: Secondary | ICD-10-CM

## 2011-07-15 DIAGNOSIS — R208 Other disturbances of skin sensation: Secondary | ICD-10-CM

## 2011-07-15 MED ORDER — DILTIAZEM HCL ER COATED BEADS 180 MG PO CP24: 180.0000 mg | ORAL_CAPSULE | Freq: Every day | ORAL | Status: DC

## 2011-07-16 NOTE — Progress Notes (Signed)
Subjective:    Patient ID: Monica Whitaker, female    DOB: May 24, 1946, 65 y.o.   MRN: [id number]  HPI  Rare pleasant patient who presents after she struck her RIGHT toe 2 days ago. She has some diffuse bruising. She is on Coumadin, and does not have any sensation in his toe due to a prior accident when she was a young woman. She also has an injury to her nail plate, and it has loosened. There is some bleeding around the nail plate.she is able walk, but her gait is altered slightly.  The PMH, PSH, Social History, Family History, Medications, and allergies have been reviewed in [facility], and have been updated if relevant.  Review of Systems  GEN: No fevers, chills. Nontoxic. Primarily MSK c/o today. MSK: Detailed in the HPI GI: tolerating PO intake without difficulty Neuro: detailed above Otherwise the pertinent positives of the ROS are noted above.      Objective:   Physical Exam   GEN: WDWN, NAD, Non-toxic, Alert & Oriented x 3 HEENT: Atraumatic, Normocephalic.  Ears and Nose: No external deformity. EXTR: No clubbing/cyanosis/edema PSYCH: Normally interactive. Conversant. Not depressed or anxious appearing.  Calm demeanor.   RIGHT foot: Nontender throughout ankle, mid foot. There is some bruising throughout the 1st digit and around the 1st and 2nd metatarsal heads. The patient is nontender throughout. Notably, at baseline she has virtually no sensation in the 1st digit. Dorsalis pedis pulses are intact. Posterior tib pulses are intact. Stable anterior drawer. Stable sub-talar tilt testing.     Assessment & Plan:    1. Toe pain, right  DG Foot Complete Right, DG Toe Great Right  2. Contusion, foot    3. Fracture of great toe    4. Decreased sensation of foot     Diffuse contusion of the 1st digit and surrounding tissues with bleeding. This is likely worsened secondary to her being on Coumadin. I suspect that she has damaged her nail plate, and we discussed that this may fall  off.  XR, 3 view, foot series Indication: foot pain Findings: There is a nondisplaced distal phalangeal fracture at the 1st digit. Excellent alignment.  RIGHT toe series. Nondisplaced distal 1st digit fracture as noted above. Difficult to see on this view.  She's been to continue to wear a stiff shoe for the next month. She can take some Tylenol if needed for pain control, but aside from this, no other pain medicines will likely be needed. No followup is going to be needed. She does have no sensation in his toe, so I advised her to be cautious over the next month, and we her stiff shoes when she is walking.  A UNIVERSAL FRACTURE CHARGE HAS BEEN APPLIED IN THE CARE OF THIS INJURY.  No co-pay should be applied in the future, and there is a 90 day follow-up period for subsequent care of this injury.   Orders Placed This Encounter  Procedures  . DG Foot Complete Right    Standing Status: Future     Number of Occurrences: 1     Standing Expiration Date: 09/13/2012    Order Specific Question:  Preferred imaging location?    Answer:  [facility]    Order Specific Question:  Reason for exam:    Answer:  ? toe fx vs MT  . DG Toe Great Right    Standing Status: Future     Number of Occurrences: 1     Standing Expiration Date: 09/14/2012  Order Specific Question:  Reason for exam:    Answer:  Rt great toe fx vs MT?    Order Specific Question:  Preferred imaging location?    Answer:  [place]    There are no discontinued medications.

## 2011-08-07 ENCOUNTER — Telehealth: Payer: Self-pay | Admitting: *Deleted

## 2011-08-07 NOTE — Telephone Encounter (Signed)
Patient called to let [name] know that her hair is coming out in the crown of her head really bad.  She wants to know if the Synthroid could be causing the hair loss?  Please advise.

## 2011-08-07 NOTE — Telephone Encounter (Signed)
Let's recheck her thyroid function. If over corrected, it could cause hairloss.

## 2011-08-10 ENCOUNTER — Other Ambulatory Visit: Payer: Self-pay | Admitting: Family Medicine

## 2011-08-10 DIAGNOSIS — E039 Hypothyroidism, unspecified: Secondary | ICD-10-CM

## 2011-08-10 NOTE — Telephone Encounter (Signed)
Patient advised as instructed via telephone.  She wants to have labs done at the same time she has PT/INR checked.  Will put her on lab schedule.

## 2011-08-11 ENCOUNTER — Ambulatory Visit (INDEPENDENT_AMBULATORY_CARE_PROVIDER_SITE_OTHER): Payer: 59 | Admitting: Family Medicine

## 2011-08-11 ENCOUNTER — Other Ambulatory Visit (INDEPENDENT_AMBULATORY_CARE_PROVIDER_SITE_OTHER): Payer: 59

## 2011-08-11 DIAGNOSIS — Z5181 Encounter for therapeutic drug level monitoring: Secondary | ICD-10-CM

## 2011-08-11 DIAGNOSIS — E039 Hypothyroidism, unspecified: Secondary | ICD-10-CM

## 2011-08-11 DIAGNOSIS — Z7901 Long term (current) use of anticoagulants: Secondary | ICD-10-CM

## 2011-08-11 DIAGNOSIS — I4891 Unspecified atrial fibrillation: Secondary | ICD-10-CM

## 2011-08-11 DIAGNOSIS — Z7902 Long term (current) use of antithrombotics/antiplatelets: Secondary | ICD-10-CM

## 2011-08-11 LAB — T4, FREE: Free T4: 0.83 ng/dL (ref 0.60–1.60)

## 2011-08-11 NOTE — Patient Instructions (Signed)
Continue 2.5 mg daily, 5 mg TU/THUR(increased 5 mg weekly)Recheck in 2 weeks

## 2011-08-24 ENCOUNTER — Ambulatory Visit: Payer: 59 | Admitting: Family Medicine

## 2011-08-24 ENCOUNTER — Encounter: Payer: Self-pay | Admitting: Family Medicine

## 2011-08-24 ENCOUNTER — Ambulatory Visit (INDEPENDENT_AMBULATORY_CARE_PROVIDER_SITE_OTHER): Payer: 59 | Admitting: Family Medicine

## 2011-08-24 ENCOUNTER — Other Ambulatory Visit: Payer: Self-pay | Admitting: *Deleted

## 2011-08-24 DIAGNOSIS — L659 Nonscarring hair loss, unspecified: Secondary | ICD-10-CM

## 2011-08-24 DIAGNOSIS — Z1159 Encounter for screening for other viral diseases: Secondary | ICD-10-CM

## 2011-08-24 LAB — BASIC METABOLIC PANEL
Chloride: 101 mEq/L (ref 96–112)
Creatinine, Ser: 0.7 mg/dL (ref 0.4–1.2)
Potassium: 4 mEq/L (ref 3.5–5.1)
Sodium: 138 mEq/L (ref 135–145)

## 2011-08-24 LAB — T4, FREE: Free T4: 0.82 ng/dL (ref 0.60–1.60)

## 2011-08-24 LAB — TSH: TSH: 0.93 u[IU]/mL (ref 0.35–5.50)

## 2011-08-24 LAB — VITAMIN B12: Vitamin B-12: 356 pg/mL (ref 211–911)

## 2011-08-24 MED ORDER — WARFARIN SODIUM 5 MG PO TABS: 5.0000 mg | ORAL_TABLET | Freq: Every day | ORAL | Status: DC

## 2011-08-24 NOTE — Progress Notes (Signed)
66 yo with h/o hypothyroidism, a afib, h/o CVA on coumadin here for several months of hair thinning.  Not coming out in clumps, but has noticed hair on top of hair getting gradually thinner. No rash or itching of scalp. Not pulling at her hair. Only other time this happened was when her thyroid was not regularly. Lab Results  Component Value Date   TSH 1.48 08/11/2011   Denies any fatigue or malaise.  Patient Active Problem List  Diagnoses  . HYPOTHYROIDISM  . ANXIETY DEPRESSION  . ATRIAL FIBRILLATION  . CEREBRAL EMBOLISM WITHOUT MENTION INFARCT  . MICROSCOPIC HEMATURIA  . MYALGIA  . DIZZINESS  . FEVER UNSPECIFIED  . FATIGUE / MALAISE  . PALPITATIONS  . Neck pain  . Edema  . Cough  . Alopecia  . Need for hepatitis C screening test   Past Medical History  Diagnosis Date  . Anxiety   . Depression   . CVA (cerebral vascular accident) 12/2007  . Ventricular tachycardia     pt told at [facility] that she had RVOT VT  . Atrial fibrillation     paroxysmal, failed medical therapy with flecainide,  NSVT with tikosyn   Past Surgical History  Procedure Date  . Vaginal hysterectomy   . Tonsillectomy    History  Substance Use Topics  . Smoking status: Never Smoker   . Smokeless tobacco: Never Used  . Alcohol Use: 0.5 oz/week    1 drink(s) per week   Family History  Problem Relation Age of Onset  . Multiple sclerosis Father    Allergies  Allergen Reactions  . Darvon   . Levofloxacin   . Propoxyphene N-Acetaminophen    Current Outpatient Prescriptions on File Prior to Visit  Medication Sig Dispense Refill  . calcium carbonate (OS-CAL) 600 MG TABS Take 600 mg by mouth 2 (two) times daily with a meal.        . cholecalciferol (VITAMIN D) 1000 UNITS tablet Take 1,000 Units by mouth daily.        . clonazePAM (KLONOPIN) 1 MG tablet 2.5mg  daily       . dextroamphetamine (DEXTROSTAT) 5 MG tablet Take 5 mg by mouth daily.        Marland Kitchen diltiazem (CARDIZEM CD) 180 MG 24 hr capsule  Take 1 capsule (180 mg total) by mouth daily.  90 capsule  3  . dronedarone (MULTAQ) 400 MG tablet Take 400 mg by mouth 2 (two) times daily with a meal.        . FLUoxetine (PROZAC) 40 MG capsule 40 mg. Take three tablets daily.      Marland Kitchen glucosamine-chondroitin 500-400 MG tablet Take 1-2 by mouth daily       . levothyroxine (SYNTHROID, LEVOTHROID) 25 MCG tablet Take 1 tablet (25 mcg total) by mouth daily.  90 tablet  3  . Multiple Vitamin (MULTIVITAMIN) tablet Take 1 tablet by mouth daily.        . vitamin E 400 UNIT capsule Take 400 Units by mouth daily.        Marland Kitchen warfarin (COUMADIN) 2.5 MG tablet Take as directed       . warfarin (COUMADIN) 5 MG tablet Take 5 mg by mouth daily.          The PMH, PSH, Social History, Family History, Medications, and allergies have been reviewed in [facility], and have been updated if relevant.   Review of Systems       See HPI   Physical Exam BP 110/70  Pulse 52  Temp(Src) 98.2 F (36.8 C) (Oral)  Wt 178 lb 4 oz (80.854 kg)  General:  GEN: nad, alert and oriented HEENT: mucous membranes moist, TM wnl bilateral, nasal exam wnl NECK: supple w/o LA CV: rrr.  no murmur PULM: ctab, no inc wob ABD: soft, +bs EXT: no edema, mild right trapezius spasm, spurling negative, normal strength.   SKIN: no acute rash, hair is thinning over top of scalp, not truly "female pattern"   alert & oriented X3, cranial nerves II-XII intact, strength normal in all extremities, sensation intact to light touch, and DTRs symmetrical and normal.   Psych:  Normal affect.  Assessment and Plan: 1. Alopecia  New with unclear etiology. Will check labs to rule out systemic cause. ?also side effect from medications- on coumadin and dextrostat. Refer to derm for further work up. TSH, T4, free, B12, Basic Metabolic Panel (BMET), Ambulatory referral to Dermatology  2. Need for hepatitis C screening test  Hepatitis C Antibody

## 2011-08-24 NOTE — Patient Instructions (Signed)
Good to see you. Please stop by to see Monica Whitaker on your way out. 

## 2011-08-25 LAB — HEPATITIS C ANTIBODY: HCV Ab: NEGATIVE

## 2011-08-26 ENCOUNTER — Ambulatory Visit (INDEPENDENT_AMBULATORY_CARE_PROVIDER_SITE_OTHER): Payer: 59 | Admitting: Family Medicine

## 2011-08-26 DIAGNOSIS — Z5181 Encounter for therapeutic drug level monitoring: Secondary | ICD-10-CM

## 2011-08-26 DIAGNOSIS — Z7901 Long term (current) use of anticoagulants: Secondary | ICD-10-CM

## 2011-08-26 LAB — POCT INR: INR: 2.7

## 2011-08-26 NOTE — Patient Instructions (Signed)
Continue 2.5 mg daily, 5 mg TU/THUrRecheck in 4 weeks

## 2011-09-23 ENCOUNTER — Ambulatory Visit (INDEPENDENT_AMBULATORY_CARE_PROVIDER_SITE_OTHER): Payer: 59 | Admitting: Family Medicine

## 2011-09-23 DIAGNOSIS — Z5181 Encounter for therapeutic drug level monitoring: Secondary | ICD-10-CM

## 2011-09-23 DIAGNOSIS — Z7902 Long term (current) use of antithrombotics/antiplatelets: Secondary | ICD-10-CM

## 2011-09-23 DIAGNOSIS — I4891 Unspecified atrial fibrillation: Secondary | ICD-10-CM

## 2011-09-23 LAB — POCT INR: INR: 2.6

## 2011-09-23 NOTE — Patient Instructions (Signed)
Continue 2.5 mg daily, 5 mg TU/THUrRecheck in 4 weeks 

## 2011-09-30 ENCOUNTER — Other Ambulatory Visit: Payer: Self-pay

## 2011-09-30 MED ORDER — DRONEDARONE HCL 400 MG PO TABS: 400.0000 mg | ORAL_TABLET | Freq: Two times a day (BID) | ORAL | Status: DC

## 2011-10-21 ENCOUNTER — Ambulatory Visit (INDEPENDENT_AMBULATORY_CARE_PROVIDER_SITE_OTHER): Payer: 59 | Admitting: Family Medicine

## 2011-10-21 DIAGNOSIS — Z7902 Long term (current) use of antithrombotics/antiplatelets: Secondary | ICD-10-CM

## 2011-10-21 DIAGNOSIS — Z5181 Encounter for therapeutic drug level monitoring: Secondary | ICD-10-CM

## 2011-10-21 DIAGNOSIS — I4891 Unspecified atrial fibrillation: Secondary | ICD-10-CM

## 2011-10-21 NOTE — Patient Instructions (Signed)
Continue current dose, check in 4 weeks  

## 2011-11-17 ENCOUNTER — Ambulatory Visit (INDEPENDENT_AMBULATORY_CARE_PROVIDER_SITE_OTHER): Payer: 59 | Admitting: Family Medicine

## 2011-11-17 DIAGNOSIS — Z5181 Encounter for therapeutic drug level monitoring: Secondary | ICD-10-CM

## 2011-11-17 DIAGNOSIS — I4891 Unspecified atrial fibrillation: Secondary | ICD-10-CM

## 2011-11-17 DIAGNOSIS — Z7901 Long term (current) use of anticoagulants: Secondary | ICD-10-CM

## 2011-11-17 DIAGNOSIS — Z7902 Long term (current) use of antithrombotics/antiplatelets: Secondary | ICD-10-CM

## 2011-11-17 LAB — POCT INR: INR: 2.8

## 2011-11-17 NOTE — Patient Instructions (Signed)
Continue current dose, check in 4 weeks  

## 2011-11-18 ENCOUNTER — Ambulatory Visit: Payer: 59

## 2011-12-15 ENCOUNTER — Ambulatory Visit (INDEPENDENT_AMBULATORY_CARE_PROVIDER_SITE_OTHER): Payer: 59 | Admitting: Internal Medicine

## 2011-12-15 DIAGNOSIS — I4891 Unspecified atrial fibrillation: Secondary | ICD-10-CM

## 2011-12-15 DIAGNOSIS — Z7902 Long term (current) use of antithrombotics/antiplatelets: Secondary | ICD-10-CM

## 2011-12-15 DIAGNOSIS — Z5181 Encounter for therapeutic drug level monitoring: Secondary | ICD-10-CM

## 2011-12-15 LAB — POCT INR: INR: 3.6

## 2011-12-15 NOTE — Patient Instructions (Signed)
Decrease to 2.5 mg daily, recheck 1 week

## 2011-12-22 ENCOUNTER — Ambulatory Visit (INDEPENDENT_AMBULATORY_CARE_PROVIDER_SITE_OTHER): Payer: 59 | Admitting: Family Medicine

## 2011-12-22 DIAGNOSIS — Z7902 Long term (current) use of antithrombotics/antiplatelets: Secondary | ICD-10-CM

## 2011-12-22 DIAGNOSIS — I4891 Unspecified atrial fibrillation: Secondary | ICD-10-CM

## 2011-12-22 DIAGNOSIS — Z5181 Encounter for therapeutic drug level monitoring: Secondary | ICD-10-CM

## 2011-12-22 NOTE — Patient Instructions (Signed)
Continue 2.5 mg daily, recheck 2 week

## 2011-12-29 ENCOUNTER — Telehealth: Payer: Self-pay | Admitting: Internal Medicine

## 2011-12-29 MED ORDER — DRONEDARONE HCL 400 MG PO TABS: 400.0000 mg | ORAL_TABLET | Freq: Two times a day (BID) | ORAL | Status: DC

## 2011-12-29 NOTE — Telephone Encounter (Signed)
Fu call Pt was calling back about multaq refill. She needs 90 day supply but hasnt seen dr allred Please call

## 2011-12-29 NOTE — Telephone Encounter (Signed)
New Problem:    Patient called in because she said that her insurance company is upset because her medication is being refilled for 60 days instead of 90 days and she needs 90 days. Please call when the prescription has been filled.

## 2012-01-04 ENCOUNTER — Encounter: Payer: Self-pay | Admitting: Internal Medicine

## 2012-01-04 ENCOUNTER — Ambulatory Visit (INDEPENDENT_AMBULATORY_CARE_PROVIDER_SITE_OTHER): Payer: 59 | Admitting: Internal Medicine

## 2012-01-04 VITALS — BP 98/64 | HR 66 | Ht 70.0 in | Wt 177.1 lb

## 2012-01-04 DIAGNOSIS — I4891 Unspecified atrial fibrillation: Secondary | ICD-10-CM

## 2012-01-04 LAB — HEPATIC FUNCTION PANEL
ALT: 15 U/L (ref 0–35)
Albumin: 3.9 g/dL (ref 3.5–5.2)
Total Protein: 7.1 g/dL (ref 6.0–8.3)

## 2012-01-04 MED ORDER — DRONEDARONE HCL 400 MG PO TABS: 400.0000 mg | ORAL_TABLET | Freq: Two times a day (BID) | ORAL | Status: DC

## 2012-01-04 MED ORDER — DILTIAZEM HCL ER COATED BEADS 180 MG PO CP24: 180.0000 mg | ORAL_CAPSULE | ORAL | Status: DC | PRN

## 2012-01-04 NOTE — Assessment & Plan Note (Signed)
Doing very well on multaq.  She has failed medical therapy with Ics and had NSVT with tikosyn. Continue multaq.  She will likely proceed with ablation if afib worsens.  Continue coumadin and multaq Check LFTs on multaq today  She wishes to take cardizem only when in afib.  We will try this.

## 2012-01-04 NOTE — Progress Notes (Signed)
Monica Mannan, MD, MD PCP  The patient presents today for routine electrophysiology followup.  Since last being seen in our clinic, the patient reports doing very well. She has afib less than once per month and episodes are short lived.  She is pleased with her present condition. Today, she denies symptoms of palpitations, chest pain, shortness of breath, orthopnea, PND, lower extremity edema, dizziness, presyncope, syncope, or neurologic sequela.  The patient feels that she is tolerating medications without difficulties and is otherwise without complaint today.   Past Medical History  Diagnosis Date  . Anxiety   . Depression   . CVA (cerebral vascular accident) 12/2007  . Ventricular tachycardia     pt told at [facility] that she had RVOT VT  . Atrial fibrillation     paroxysmal, failed medical therapy with flecainide,  NSVT with tikosyn   Past Surgical History  Procedure Date  . Vaginal hysterectomy   . Tonsillectomy     Current Outpatient Prescriptions  Medication Sig Dispense Refill  . calcium carbonate (OS-CAL) 600 MG TABS Take 600 mg by mouth 2 (two) times daily with a meal.        . cholecalciferol (VITAMIN D) 1000 UNITS tablet Take 1,000 Units by mouth daily.        . clonazePAM (KLONOPIN) 1 MG tablet as needed. 2.5mg  daily      . dextroamphetamine (DEXTROSTAT) 5 MG tablet Take 5 mg by mouth daily.        Marland Kitchen diltiazem (CARDIZEM CD) 180 MG 24 hr capsule Take 1 capsule (180 mg total) by mouth daily.  90 capsule  3  . dronedarone (MULTAQ) 400 MG tablet Take 1 tablet (400 mg total) by mouth 2 (two) times daily with a meal.  180 tablet  1  . DULoxetine (CYMBALTA) 60 MG capsule Take 60 mg by mouth daily.      Marland Kitchen glucosamine-chondroitin 500-400 MG tablet Take 1-2 by mouth daily       . levothyroxine (SYNTHROID, LEVOTHROID) 25 MCG tablet Take 1 tablet (25 mcg total) by mouth daily.  90 tablet  3  . Multiple Vitamin (MULTIVITAMIN) tablet Take 1 tablet by mouth daily.        . vitamin E 400 UNIT  capsule Take 400 Units by mouth daily.        Marland Kitchen warfarin (COUMADIN) 2.5 MG tablet Take as directed       . DISCONTD: dextroamphetamine (DEXTROSTAT) 5 MG tablet Take 1 tablet (5 mg total) by mouth daily.  30 tablet  0  . DISCONTD: warfarin (COUMADIN) 5 MG tablet Take 1 tablet (5 mg total) by mouth daily.  60 tablet  6    Allergies  Allergen Reactions  . Darvon   . Levofloxacin   . Propoxyphene-Acetaminophen     History   Social History  . Marital Status: Married    Spouse Name: N/A    Number of Children: 0  . Years of Education: N/A   Occupational History  . Retired    Social History Main Topics  . Smoking status: Never Smoker   . Smokeless tobacco: Never Used  . Alcohol Use: 0.5 oz/week    1 drink(s) per week  . Drug Use: No  . Sexually Active: Not on file   Other Topics Concern  . Not on file   Social History Narrative   Lives in [place] with her husband.  No children.  Former Retail buyer    Family History  Problem Relation Age  of Onset  . Multiple sclerosis Father     Physical Exam: Filed Vitals:   01/04/12 1009  BP: 98/64  Pulse: 66  Height: 5\' 10"  (1.778 m)  Weight: 177 lb 1.9 oz (80.341 kg)    GEN- The patient is well appearing, alert and oriented x 3 today.   Head- normocephalic, atraumatic Eyes-  Sclera clear, conjunctiva pink Ears- hearing intact Oropharynx- clear Neck- supple, no JVP Lymph- no cervical lymphadenopathy Lungs- Clear to ausculation bilaterally, normal work of breathing Heart- Regular rate and rhythm, no murmurs, rubs or gallops, PMI not laterally displaced GI- soft, NT, ND, + BS Extremities- no clubbing, cyanosis, or edema  ekg today reveals sinus rhythm 66 bpm, otherwise normal ekg, Qtc 444  Assessment and Plan:

## 2012-01-04 NOTE — Patient Instructions (Signed)
Your physician wants you to follow-up in: 12 months with Dr Jacquiline Doe will receive a reminder letter in the mail two months in advance. If you don't receive a letter, please call our office to schedule the follow-up appointment.  Your physician recommends that you return for lab work today Liver panel  Your physician has recommended you make the following change in your medication:  1) Only take Cardizem as needed for afib

## 2012-01-05 ENCOUNTER — Ambulatory Visit (INDEPENDENT_AMBULATORY_CARE_PROVIDER_SITE_OTHER): Payer: 59 | Admitting: Family Medicine

## 2012-01-05 DIAGNOSIS — Z7902 Long term (current) use of antithrombotics/antiplatelets: Secondary | ICD-10-CM

## 2012-01-05 DIAGNOSIS — Z5181 Encounter for therapeutic drug level monitoring: Secondary | ICD-10-CM

## 2012-01-05 DIAGNOSIS — I4891 Unspecified atrial fibrillation: Secondary | ICD-10-CM

## 2012-01-05 NOTE — Patient Instructions (Signed)
2.5 mg daily, 5 mg T/TH recheck 1 week increased 5 mg weekly

## 2012-01-13 ENCOUNTER — Ambulatory Visit (INDEPENDENT_AMBULATORY_CARE_PROVIDER_SITE_OTHER): Payer: 59 | Admitting: Family Medicine

## 2012-01-13 DIAGNOSIS — Z7901 Long term (current) use of anticoagulants: Secondary | ICD-10-CM

## 2012-01-13 DIAGNOSIS — Z5181 Encounter for therapeutic drug level monitoring: Secondary | ICD-10-CM

## 2012-01-13 DIAGNOSIS — I4891 Unspecified atrial fibrillation: Secondary | ICD-10-CM

## 2012-01-13 NOTE — Patient Instructions (Signed)
2.5 mg daily, 5 mg T/TH /SAT 2 weeks ( increased 2.5 mg)

## 2012-01-27 ENCOUNTER — Ambulatory Visit (INDEPENDENT_AMBULATORY_CARE_PROVIDER_SITE_OTHER): Payer: 59 | Admitting: Family Medicine

## 2012-01-27 DIAGNOSIS — I4891 Unspecified atrial fibrillation: Secondary | ICD-10-CM

## 2012-01-27 DIAGNOSIS — Z5181 Encounter for therapeutic drug level monitoring: Secondary | ICD-10-CM

## 2012-01-27 DIAGNOSIS — Z7902 Long term (current) use of antithrombotics/antiplatelets: Secondary | ICD-10-CM

## 2012-01-27 LAB — POCT INR: INR: 3.1

## 2012-01-27 NOTE — Patient Instructions (Signed)
Decrease to  2.5 mg daily, 5 mg Tu /SAT  Recheck 2 weeks (pt going out of town so I gave her an order to have drawn and results faxed here)

## 2012-02-10 ENCOUNTER — Encounter: Payer: Self-pay | Admitting: Family Medicine

## 2012-02-24 ENCOUNTER — Ambulatory Visit (INDEPENDENT_AMBULATORY_CARE_PROVIDER_SITE_OTHER): Payer: 59 | Admitting: Family Medicine

## 2012-02-24 DIAGNOSIS — Z7902 Long term (current) use of antithrombotics/antiplatelets: Secondary | ICD-10-CM

## 2012-02-24 DIAGNOSIS — I4891 Unspecified atrial fibrillation: Secondary | ICD-10-CM

## 2012-02-24 DIAGNOSIS — Z5181 Encounter for therapeutic drug level monitoring: Secondary | ICD-10-CM

## 2012-02-24 LAB — POCT INR: INR: 3

## 2012-02-24 NOTE — Patient Instructions (Signed)
Continue current dose, check in 4 weeks  

## 2012-03-04 ENCOUNTER — Ambulatory Visit (INDEPENDENT_AMBULATORY_CARE_PROVIDER_SITE_OTHER): Payer: 59 | Admitting: Family Medicine

## 2012-03-04 ENCOUNTER — Encounter: Payer: Self-pay | Admitting: Family Medicine

## 2012-03-04 VITALS — BP 110/76 | HR 72 | Temp 98.5°F | Ht 70.0 in | Wt 179.5 lb

## 2012-03-04 DIAGNOSIS — R300 Dysuria: Secondary | ICD-10-CM

## 2012-03-04 DIAGNOSIS — N390 Urinary tract infection, site not specified: Secondary | ICD-10-CM | POA: Insufficient documentation

## 2012-03-04 DIAGNOSIS — I4891 Unspecified atrial fibrillation: Secondary | ICD-10-CM

## 2012-03-04 LAB — POCT UA - MICROSCOPIC ONLY

## 2012-03-04 LAB — POCT URINALYSIS DIPSTICK
Glucose, UA: NEGATIVE
Nitrite, UA: NEGATIVE
Urobilinogen, UA: 0.2

## 2012-03-04 MED ORDER — SULFAMETHOXAZOLE-TRIMETHOPRIM 800-160 MG PO TABS: 1.0000 | ORAL_TABLET | Freq: Two times a day (BID) | ORAL | Status: AC

## 2012-03-04 NOTE — Assessment & Plan Note (Signed)
On coumadin. Follow INR closely on antibiotics.

## 2012-03-04 NOTE — Progress Notes (Signed)
  Subjective:    Patient ID: Monica Whitaker, female    DOB: 11/30/1945, 65 y.o.   MRN: [id number]  HPI Comments: Micah Flesher into Afib on Tuesday afternoon, took diltiazem, returned to regular rhythm  24 hours later, associated headache. started acetominophen/tramadol.  Psychiatrist is reducing dextroamphetamine 2 days ago.  Dysuria  The current episode started today. The problem occurs every urination. The quality of the pain is described as burning. There has been no fever. She is not sexually active. There is no history of pyelonephritis. Associated symptoms include chills, hesitancy and urgency. Pertinent negatives include no frequency, hematuria, nausea, possible pregnancy, sweats or vomiting. Associated symptoms comments: Weak stream. She has tried acetaminophen and increased fluids (has been using cranberrry pills) for the symptoms. The treatment provided mild relief. There is no history of catheterization, kidney stones, recurrent UTIs, a single kidney, urinary stasis or a urological procedure.     Last UTI 05/2011 klebseilla pansensitive.  Hx of hematuria.. Had full uro work up  In last 2 years.  Review of Systems  Constitutional: Positive for chills.  Gastrointestinal: Negative for nausea and vomiting.  Genitourinary: Positive for dysuria, hesitancy and urgency. Negative for frequency and hematuria.       Objective:   Physical Exam  Constitutional: Vital signs are normal. She appears well-developed and well-nourished. She is cooperative.  Non-toxic appearance. She does not appear ill. No distress.  HENT:  Head: Normocephalic.  Right Ear: Hearing, tympanic membrane, external ear and ear canal normal. Tympanic membrane is not erythematous, not retracted and not bulging.  Left Ear: Hearing, tympanic membrane, external ear and ear canal normal. Tympanic membrane is not erythematous, not retracted and not bulging.  Nose: No mucosal edema or rhinorrhea. Right sinus exhibits no maxillary  sinus tenderness and no frontal sinus tenderness. Left sinus exhibits no maxillary sinus tenderness and no frontal sinus tenderness.  Mouth/Throat: Uvula is midline, oropharynx is clear and moist and mucous membranes are normal.  Eyes: Conjunctivae, EOM and lids are normal. Pupils are equal, round, and reactive to light. No foreign bodies found.  Neck: Trachea normal and normal range of motion. Neck supple. Carotid bruit is not present. No mass and no thyromegaly present.  Cardiovascular: Normal rate, regular rhythm, S1 normal, S2 normal, normal heart sounds, intact distal pulses and normal pulses.  Exam reveals no gallop and no friction rub.   No murmur heard. Pulmonary/Chest: Effort normal and breath sounds normal. Not tachypneic. No respiratory distress. She has no decreased breath sounds. She has no wheezes. She has no rhonchi. She has no rales.  Abdominal: Soft. Normal appearance and bowel sounds are normal. She exhibits no mass. There is no tenderness. There is no rebound and no guarding.  Neurological: She is alert.  Skin: Skin is warm, dry and intact. No rash noted.  Psychiatric: Her speech is normal and behavior is normal. Judgment and thought content normal. Her mood appears not anxious. Cognition and memory are normal. She does not exhibit a depressed mood.          Assessment & Plan:

## 2012-03-04 NOTE — Assessment & Plan Note (Addendum)
Symptoms are textbook for UTI, no suggestion of kidney infection.   UA mildly positive,? If blood new or from past microscopic hematuria. Will send for culture but treat emperically with sulfa x 3 days.

## 2012-03-04 NOTE — Patient Instructions (Addendum)
Increase fluids, start sulfa 1 tab BID x 3 days.  We will call urine culture results. Return for INR on Monday.

## 2012-03-06 LAB — URINE CULTURE: Colony Count: >=100000

## 2012-03-07 ENCOUNTER — Ambulatory Visit (INDEPENDENT_AMBULATORY_CARE_PROVIDER_SITE_OTHER): Payer: 59 | Admitting: Family Medicine

## 2012-03-07 DIAGNOSIS — I4891 Unspecified atrial fibrillation: Secondary | ICD-10-CM

## 2012-03-07 DIAGNOSIS — Z7901 Long term (current) use of anticoagulants: Secondary | ICD-10-CM

## 2012-03-07 NOTE — Patient Instructions (Signed)
Hold x 2 days, expected high INR due to antibiotics. Resume dose on Wednesday, check in 10 days,  2.5 mg daily, 5 mg Tu /Thur

## 2012-03-16 ENCOUNTER — Ambulatory Visit (INDEPENDENT_AMBULATORY_CARE_PROVIDER_SITE_OTHER): Payer: 59 | Admitting: Family Medicine

## 2012-03-16 DIAGNOSIS — I4891 Unspecified atrial fibrillation: Secondary | ICD-10-CM

## 2012-03-16 DIAGNOSIS — Z7902 Long term (current) use of antithrombotics/antiplatelets: Secondary | ICD-10-CM

## 2012-03-16 DIAGNOSIS — Z5181 Encounter for therapeutic drug level monitoring: Secondary | ICD-10-CM

## 2012-03-16 NOTE — Patient Instructions (Signed)
2.5 mg daily, 5 mg Tu /Thur. Recheck 4 weeks

## 2012-03-22 ENCOUNTER — Ambulatory Visit: Payer: Self-pay | Admitting: Family Medicine

## 2012-03-22 LAB — HM MAMMOGRAPHY: HM Mammogram: NORMAL

## 2012-03-23 ENCOUNTER — Ambulatory Visit: Payer: 59

## 2012-03-24 ENCOUNTER — Encounter: Payer: Self-pay | Admitting: *Deleted

## 2012-03-24 ENCOUNTER — Encounter: Payer: Self-pay | Admitting: Family Medicine

## 2012-04-11 ENCOUNTER — Ambulatory Visit (INDEPENDENT_AMBULATORY_CARE_PROVIDER_SITE_OTHER): Payer: 59 | Admitting: Family Medicine

## 2012-04-11 ENCOUNTER — Encounter: Payer: Self-pay | Admitting: Family Medicine

## 2012-04-11 VITALS — BP 120/82 | HR 62 | Temp 97.9°F | Wt 179.8 lb

## 2012-04-11 DIAGNOSIS — Z7902 Long term (current) use of antithrombotics/antiplatelets: Secondary | ICD-10-CM

## 2012-04-11 DIAGNOSIS — Z5181 Encounter for therapeutic drug level monitoring: Secondary | ICD-10-CM

## 2012-04-11 DIAGNOSIS — R6889 Other general symptoms and signs: Secondary | ICD-10-CM

## 2012-04-11 DIAGNOSIS — I4891 Unspecified atrial fibrillation: Secondary | ICD-10-CM

## 2012-04-11 LAB — POCT URINALYSIS DIPSTICK
Bilirubin, UA: NEGATIVE
Glucose, UA: NEGATIVE
Leukocytes, UA: NEGATIVE
Nitrite, UA: NEGATIVE
Urobilinogen, UA: NEGATIVE

## 2012-04-11 LAB — POCT INR: INR: 3.3

## 2012-04-11 NOTE — Patient Instructions (Signed)
2.5 mg daily, 5 mg Tu /Thurs (Hold x 3 days due to minor surgery in 2 days) resume regular dose Thursday, check 1 week

## 2012-04-11 NOTE — Progress Notes (Signed)
Scheduled to have lipoma removed by dermatology clinic on Wednesday at [facility] (Dr. Gwen Pounds). INR 3.3 today.    She was concerned about a 'low grade fever' over the last 2 weeks.  Tmax 99.8 a few days ago.  None >100.4.  Episodically fatigued.  Asking about possible UTI.  No dysuria.  No chills, cough, sputum.  No ear pain, ST, rhinorrhea.  No diarrhea.  Skin on her back feels hot but she doesn't have aches.    She was asking about taking salt tabs during exercise and I directed her to call cards about this.    She was asking about meds for HA, advised the best option would be tylenol.  She can f/u with PCP about this.   Meds, vitals, and allergies reviewed.   ROS: See HPI.  Otherwise, noncontributory.  GEN: nad, alert and oriented HEENT: mucous membranes moist, tms wnl, nasal and OP exam wnl NECK: supple w/o LA CV: rrr. PULM: ctab, no inc wob ABD: soft, +bs, not ttp EXT: no edema SKIN: no acute rash

## 2012-04-11 NOTE — Assessment & Plan Note (Signed)
Reassuring exam, will check ucx.  Trace blood likely from coumadin.  Will notify derm clinic about INR.  See INR notes on coumadin lab visit.  Follow clinically o/w.  Pt agrees.

## 2012-04-11 NOTE — Patient Instructions (Addendum)
We'll contact you with your lab report.  We'll be in touch with the derm clinic.  Check to see if you are taking tylenol for the headaches.  Call cardiology about the salt tablets. Take care.

## 2012-04-13 ENCOUNTER — Ambulatory Visit: Payer: 59

## 2012-04-13 ENCOUNTER — Telehealth: Payer: Self-pay | Admitting: *Deleted

## 2012-04-13 LAB — URINE CULTURE: Organism ID, Bacteria: NO GROWTH

## 2012-04-13 NOTE — Telephone Encounter (Signed)
Pt is scheduled for minor derm surgery this afternoon.  She was told to stop her coumadin on Monday, didn't take it Monday or Tuesday night.  She went in a-fib last night.  Took one diltiazem last night and one this morning,  She feels better now but still in a-fib and her BP is low, 84/68.  Should she do anything about this or still plan on having her surgery this afternoon?

## 2012-04-13 NOTE — Telephone Encounter (Signed)
Advised patient as instructed. 

## 2012-04-13 NOTE — Telephone Encounter (Signed)
It most likely is ok for minor surgery but I would definitely let her dermatologist know about her symptoms.  As I am not the one who will perform the surgery and I am not sure how extensive it is (i.e. How much bleeding expected), I would like for them to make that determination. Please keep [place] posted.

## 2012-04-15 ENCOUNTER — Telehealth: Payer: Self-pay | Admitting: Internal Medicine

## 2012-04-15 NOTE — Telephone Encounter (Signed)
Pt has low b/p and is it ok to take one salt tablet when she is feeling weak and tired

## 2012-04-15 NOTE — Telephone Encounter (Signed)
Patient states that when her BP is down she feels weak cold and clammy, and some one suggested in the hospital to eat potatoes ships, she did, then she felt  better. Then she started to use salt pills and has used it before and it has helped . She would like to know if she can take one today. Pt is aware that is okay to take one. She is to call the office if BP gets high or has any other symptoms. Pt verbalized understanding.

## 2012-04-22 ENCOUNTER — Ambulatory Visit (INDEPENDENT_AMBULATORY_CARE_PROVIDER_SITE_OTHER): Payer: 59 | Admitting: Family Medicine

## 2012-04-22 DIAGNOSIS — Z7902 Long term (current) use of antithrombotics/antiplatelets: Secondary | ICD-10-CM

## 2012-04-22 DIAGNOSIS — I4891 Unspecified atrial fibrillation: Secondary | ICD-10-CM

## 2012-04-22 DIAGNOSIS — Z5181 Encounter for therapeutic drug level monitoring: Secondary | ICD-10-CM

## 2012-04-22 NOTE — Patient Instructions (Signed)
Continue 2.5 mg daily, 5 mg Tu /Thurs check 4 week

## 2012-04-29 ENCOUNTER — Other Ambulatory Visit: Payer: Self-pay | Admitting: Cardiology

## 2012-05-05 ENCOUNTER — Ambulatory Visit (INDEPENDENT_AMBULATORY_CARE_PROVIDER_SITE_OTHER): Payer: 59 | Admitting: Family Medicine

## 2012-05-05 ENCOUNTER — Encounter: Payer: Self-pay | Admitting: Family Medicine

## 2012-05-05 VITALS — BP 104/66 | HR 72 | Temp 97.6°F | Wt 178.0 lb

## 2012-05-05 DIAGNOSIS — R5383 Other fatigue: Secondary | ICD-10-CM

## 2012-05-05 DIAGNOSIS — R5381 Other malaise: Secondary | ICD-10-CM

## 2012-05-05 DIAGNOSIS — R6889 Other general symptoms and signs: Secondary | ICD-10-CM

## 2012-05-05 DIAGNOSIS — E039 Hypothyroidism, unspecified: Secondary | ICD-10-CM

## 2012-05-05 LAB — CBC WITH DIFFERENTIAL/PLATELET
Basophils Absolute: 0 10*3/uL (ref 0.0–0.1)
Basophils Relative: 0.9 % (ref 0.0–3.0)
Eosinophils Absolute: 0.2 10*3/uL (ref 0.0–0.7)
HCT: 41.9 % (ref 36.0–46.0)
Hemoglobin: 14 g/dL (ref 12.0–15.0)
Lymphs Abs: 1.8 10*3/uL (ref 0.7–4.0)
MCHC: 33.4 g/dL (ref 30.0–36.0)
MCV: 94 fl (ref 78.0–100.0)
Neutro Abs: 1.4 10*3/uL (ref 1.4–7.7)
RDW: 13.7 % (ref 11.5–14.6)

## 2012-05-05 NOTE — Progress Notes (Signed)
66 yo with h/o hypothyroidism, a afib, h/o CVA on coumadin here for malaise, body aches, elevated body temperature.  Past two weeks, increased malaise. For Monica Whitaker, she feels her body temperature is usually much lower but has been running 99 or so.  Has had two episodes like this in past, had extensive work up. CBC, CK, LFTs, TSH within normal limits.   Lab Results  Component Value Date   TSH 0.93 08/24/2011   Referred to multiple specialist, including neuro given her h/o CVAs.  Denies depression.  Appetite good.  Weight stable, but she thinks she should be gaining weight based on what she is eating.  Wt Readings from Last 3 Encounters:  05/05/12 178 lb (80.74 kg)  04/11/12 179 lb 12 oz (81.534 kg)  03/04/12 179 lb 8 oz (81.421 kg)      Patient Active Problem List  Diagnosis  . HYPOTHYROIDISM  . ANXIETY DEPRESSION  . ATRIAL FIBRILLATION  . CEREBRAL EMBOLISM WITHOUT MENTION INFARCT  . MICROSCOPIC HEMATURIA  . MYALGIA  . DIZZINESS  . FEVER UNSPECIFIED  . FATIGUE / MALAISE  . PALPITATIONS  . Neck pain  . Edema  . Cough  . Alopecia  . Need for hepatitis C screening test  . UTI (lower urinary tract infection)  . Abnormal body temperature   Past Medical History  Diagnosis Date  . Anxiety   . Depression   . CVA (cerebral vascular accident) 12/2007  . 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 Atrial fibrillation     paroxysmal, failed medical therapy with flecainide,  NSVT with tikosyn   Past Surgical History  Procedure Date  . Vaginal hysterectomy   . Tonsillectomy    History  Substance Use Topics  . Smoking status: Never Smoker   . Smokeless tobacco: Never Used  . Alcohol Use: 0.5 oz/week    1 drink(s) per week   Family History  Problem Relation Age of Onset  . Multiple sclerosis Father    Allergies  Allergen Reactions  . Darvon   . Levofloxacin   . Propoxyphene-Acetaminophen    Current Outpatient Prescriptions on File  Prior to Visit  Medication Sig Dispense Refill  . calcium carbonate (OS-CAL) 600 MG TABS Take 600 mg by mouth 2 (two) times daily with a meal.        . cholecalciferol (VITAMIN D) 1000 UNITS tablet Take 1,000 Units by mouth daily.        Marland Kitchen dextroamphetamine (DEXTROSTAT) 5 MG tablet Take 2.5 mg by mouth daily.       Marland Kitchen diltiazem (CARDIZEM CD) 180 MG 24 hr capsule Take 1 capsule (180 mg total) by mouth as needed.  90 capsule  3  . dronedarone (MULTAQ) 400 MG tablet Take 1 tablet (400 mg total) by mouth 2 (two) times daily with a meal.  180 tablet  3  . DULoxetine (CYMBALTA) 60 MG capsule Take 60 mg by mouth daily.      Marland Kitchen levothyroxine (SYNTHROID, LEVOTHROID) 25 MCG tablet Take 1 tablet (25 mcg total) by mouth daily.  90 tablet  3  . Multiple Vitamin (MULTIVITAMIN) tablet Take 1 tablet by mouth daily.        . vitamin E 400 UNIT capsule Take 400 Units by mouth daily.        Marland Kitchen warfarin (COUMADIN) 2.5 MG tablet Take as directed          The PMH, PSH, Social History, Family  History, Medications, and allergies have been reviewed in [facility], and have been updated if relevant.   Review of Systems       See HPI   Physical Exam BP 104/66  Pulse 72  Temp 97.6 F (36.4 C)  Wt 178 lb (80.74 kg)  General:  GEN: nad, alert and oriented HEENT: mucous membranes moist, TM wnl bilateral, nasal exam wnl NECK: supple w/o LA CV: rrr.  no murmur PULM: ctab, no inc wob ABD: soft, +bs EXT: no edema, mild right trapezius spasm, spurling negative, normal strength.   SKIN: no acute rash, alert & oriented X3, cranial nerves II-XII intact, strength normal in all extremities, sensation intact to light touch, and DTRs symmetrical and normal.   Psych:  Normal affect.  Assessment and Plan:  1. HYPOTHYROIDISM  TSH, T4, Free, CBC with Differential  2. FATIGUE / MALAISE  Deteriorated with unknown etiology. ?adrenal insufficiency?  Will recheck thyroid labs, CBC today, refer to endocrinology for further work  up. The patient indicates understanding of these issues and agrees with the plan.  Ambulatory referral to Endocrinology  3. Abnormal body temperature  Normal body temp today- has not taken any Tylenol in last 12 hours.

## 2012-05-05 NOTE — Patient Instructions (Addendum)
It was great to see you. Please go to the lab. After you to the lab, please stop by to see Shirlee Limerick on your way out.

## 2012-05-10 ENCOUNTER — Ambulatory Visit (INDEPENDENT_AMBULATORY_CARE_PROVIDER_SITE_OTHER): Payer: 59 | Admitting: Family Medicine

## 2012-05-10 ENCOUNTER — Encounter: Payer: Self-pay | Admitting: Family Medicine

## 2012-05-10 ENCOUNTER — Ambulatory Visit (INDEPENDENT_AMBULATORY_CARE_PROVIDER_SITE_OTHER)
Admission: RE | Admit: 2012-05-10 | Discharge: 2012-05-10 | Disposition: A | Discharge status: Home / Self Care | Payer: 59 | Source: Ambulatory Visit | Attending: Family Medicine | Admitting: Family Medicine

## 2012-05-10 ENCOUNTER — Telehealth: Payer: Self-pay | Admitting: *Deleted

## 2012-05-10 VITALS — BP 102/64 | HR 64 | Temp 97.8°F

## 2012-05-10 DIAGNOSIS — R1310 Dysphagia, unspecified: Secondary | ICD-10-CM

## 2012-05-10 MED ORDER — OMEPRAZOLE 20 MG PO CPDR: 20.0000 mg | DELAYED_RELEASE_CAPSULE | Freq: Every day | ORAL | Status: DC

## 2012-05-10 NOTE — Telephone Encounter (Signed)
Received rx request from pharmacy for flecainide. Called pt to verify that pt discontinued med. Pt states she had.

## 2012-05-10 NOTE — Patient Instructions (Addendum)
Good to see you. Let's try prilosec 20 mg daily every evening for next three days. We will call you with your xray results.

## 2012-05-10 NOTE — Telephone Encounter (Signed)
New Problem:    Patient called returning your call.  Please call back. 

## 2012-05-10 NOTE — Progress Notes (Signed)
66 yo with h/o hypothyroidism, a afib, h/o CVA on coumadin here for pain in her back when she swallows.  Had a recent viral illness.  No difficulty swallowing foods or liquids.  Notices that even when she swallows saliva, her left back hurts. Also hurts with deep breaths.  No CP or SOB.  No fevers last two days.  Appetite good.  Weight stable, but she thinks she should be gaining weight based on what she is eating.  Wt Readings from Last 3 Encounters:  05/05/12 178 lb (80.74 kg)  04/11/12 179 lb 12 oz (81.534 kg)  03/04/12 179 lb 8 oz (81.421 kg)   On coumadin.   Patient Active Problem List  Diagnosis  . HYPOTHYROIDISM  . ANXIETY DEPRESSION  . ATRIAL FIBRILLATION  . CEREBRAL EMBOLISM WITHOUT MENTION INFARCT  . MICROSCOPIC HEMATURIA  . MYALGIA  . DIZZINESS  . FEVER UNSPECIFIED  . FATIGUE / MALAISE  . PALPITATIONS  . Neck pain  . Edema  . Cough  . Alopecia  . Need for hepatitis C screening test  . UTI (lower urinary tract infection)  . Abnormal body temperature   Past Medical History  Diagnosis Date  . Anxiety   . Depression   . CVA (cerebral vascular accident) 12/2007  . 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 Atrial fibrillation     paroxysmal, failed medical therapy with flecainide,  NSVT with tikosyn   Past Surgical History  Procedure Date  . Vaginal hysterectomy   . Tonsillectomy    History  Substance Use Topics  . Smoking status: Never Smoker   . Smokeless tobacco: Never Used  . Alcohol Use: 0.5 oz/week    1 drink(s) per week   Family History  Problem Relation Age of Onset  . Multiple sclerosis Father    Allergies  Allergen Reactions  . Darvon   . Levofloxacin   . Propoxyphene-Acetaminophen    Current Outpatient Prescriptions on File Prior to Visit  Medication Sig Dispense Refill  . acetaminophen (TYLENOL) 500 MG tablet Take 2 tablets every 6 hours as needed      . calcium carbonate (OS-CAL) 600 MG TABS Take  600 mg by mouth 2 (two) times daily with a meal.        . cholecalciferol (VITAMIN D) 1000 UNITS tablet Take 1,000 Units by mouth daily.        Marland Kitchen dextroamphetamine (DEXTROSTAT) 5 MG tablet Take 2.5 mg by mouth daily.       Marland Kitchen diltiazem (CARDIZEM CD) 180 MG 24 hr capsule Take 1 capsule (180 mg total) by mouth as needed.  90 capsule  3  . dronedarone (MULTAQ) 400 MG tablet Take 1 tablet (400 mg total) by mouth 2 (two) times daily with a meal.  180 tablet  3  . DULoxetine (CYMBALTA) 60 MG capsule Take 60 mg by mouth daily.      Marland Kitchen levothyroxine (SYNTHROID, LEVOTHROID) 25 MCG tablet Take 1 tablet (25 mcg total) by mouth daily.  90 tablet  3  . Multiple Vitamin (MULTIVITAMIN) tablet Take 1 tablet by mouth daily.        . Oral Electrolytes (BUFFERED SALT PO) Take by mouth.      . vitamin E 400 UNIT capsule Take 400 Units by mouth daily.        Marland Kitchen warfarin (COUMADIN) 2.5 MG tablet Take as directed          The PMH, PSH,  Social History, Family History, Medications, and allergies have been reviewed in [facility], and have been updated if relevant.   Review of Systems       See HPI   Physical Exam BP 102/64  Pulse 64  Temp 97.8 F (36.6 C)  General:  GEN: nad, alert and oriented HEENT: mucous membranes moist, TM wnl bilateral, nasal exam wnl NECK: supple w/o LA CV: rrr.  no murmur PULM: ctab, no inc wob ABD: soft, +bs EXT: no edema, mild right trapezius spasm, spurling negative, normal strength.   SKIN: no acute rash, alert & oriented X3, cranial nerves II-XII intact, strength normal in all extremities, sensation intact to light touch, and DTRs symmetrical and normal.   Psych:  Normal affect.  Assessment and Plan:  1. Pain with swallowing  DG Chest 2 View  New- differential is wide but at the top of the list would be a viral pleurisy. Will check CXR today.  Cannot take NSAIDs (on coumadin). May also be silent GERD- will place on 3 day course of prilosec - use with caution given coumadin  use. On coumadin, so I am less concerned about PE. The patient indicates understanding of these issues and agrees with the plan.

## 2012-05-23 ENCOUNTER — Ambulatory Visit (INDEPENDENT_AMBULATORY_CARE_PROVIDER_SITE_OTHER): Payer: 59 | Admitting: Family Medicine

## 2012-05-23 DIAGNOSIS — Z7902 Long term (current) use of antithrombotics/antiplatelets: Secondary | ICD-10-CM

## 2012-05-23 DIAGNOSIS — Z5181 Encounter for therapeutic drug level monitoring: Secondary | ICD-10-CM

## 2012-05-23 DIAGNOSIS — I4891 Unspecified atrial fibrillation: Secondary | ICD-10-CM

## 2012-05-23 DIAGNOSIS — D729 Disorder of white blood cells, unspecified: Secondary | ICD-10-CM

## 2012-05-23 LAB — CBC WITH DIFFERENTIAL/PLATELET
Basophils Absolute: 0 10*3/uL (ref 0.0–0.1)
Eosinophils Absolute: 0.3 10*3/uL (ref 0.0–0.7)
HCT: 40.3 % (ref 36.0–46.0)
Hemoglobin: 13.3 g/dL (ref 12.0–15.0)
Lymphs Abs: 1.5 10*3/uL (ref 0.7–4.0)
MCHC: 33.1 g/dL (ref 30.0–36.0)
MCV: 94.3 fl (ref 78.0–100.0)
Monocytes Absolute: 0.5 10*3/uL (ref 0.1–1.0)
Neutro Abs: 2.7 10*3/uL (ref 1.4–7.7)
Platelets: 242 10*3/uL (ref 150.0–400.0)
RDW: 14.1 % (ref 11.5–14.6)

## 2012-05-23 NOTE — Patient Instructions (Signed)
Has been out of town eating differently, continue current dose, 2.5 mg daily, 5 mg Tu /Thurs check 2 week

## 2012-05-31 ENCOUNTER — Ambulatory Visit (INDEPENDENT_AMBULATORY_CARE_PROVIDER_SITE_OTHER): Payer: 59

## 2012-05-31 DIAGNOSIS — Z23 Encounter for immunization: Secondary | ICD-10-CM

## 2012-06-03 ENCOUNTER — Telehealth: Payer: Self-pay | Admitting: Family Medicine

## 2012-06-03 NOTE — Telephone Encounter (Signed)
Note, sent to PCP as FYI.

## 2012-06-03 NOTE — Telephone Encounter (Signed)
° °  Caller: [name]/Patient; Patient Name: Monica Whitaker; PCP: Ruthe Mannan [facility]); Best Callback Phone Number: [phone].  She had a flu shot 05/31/12 in the left deltoid and area is still swollen and red.  It is as round as a tennis ball and raised 1/4 of and inch. Triaged Skin Lesions and all emergent symptoms R/O. Disposition is provide home care.  These instructions and call back parameters given.

## 2012-06-06 ENCOUNTER — Ambulatory Visit: Payer: 59

## 2012-06-07 ENCOUNTER — Ambulatory Visit (INDEPENDENT_AMBULATORY_CARE_PROVIDER_SITE_OTHER): Payer: 59 | Admitting: Family Medicine

## 2012-06-07 DIAGNOSIS — Z5181 Encounter for therapeutic drug level monitoring: Secondary | ICD-10-CM

## 2012-06-07 DIAGNOSIS — I4891 Unspecified atrial fibrillation: Secondary | ICD-10-CM

## 2012-06-07 DIAGNOSIS — Z7902 Long term (current) use of antithrombotics/antiplatelets: Secondary | ICD-10-CM

## 2012-06-07 NOTE — Patient Instructions (Signed)
continue current dose, 2.5 mg daily, 5 mg Tu /Thurs check 4 week

## 2012-06-20 ENCOUNTER — Telehealth: Payer: Self-pay | Admitting: *Deleted

## 2012-06-20 ENCOUNTER — Telehealth: Payer: Self-pay | Admitting: Family Medicine

## 2012-06-20 MED ORDER — CYCLOBENZAPRINE HCL 5 MG PO TABS: 5.0000 mg | ORAL_TABLET | Freq: Three times a day (TID) | ORAL | Status: DC | PRN

## 2012-06-20 NOTE — Telephone Encounter (Signed)
Left message asking pt to call back. 

## 2012-06-20 NOTE — Telephone Encounter (Signed)
Pt states she has pulled a muscle in her back and is asking that a muscle relaxer be called to [facility].  She says she knows what the problem is and doesn't know if she could get in a car to come here for appt.  Please advise.

## 2012-06-20 NOTE — Telephone Encounter (Signed)
Advise patient that medicine has been sent in.

## 2012-06-20 NOTE — Telephone Encounter (Signed)
Pharmacist calling, a new script was sent over for a Cyclosporin this afternoon and patient is already on Multaq.  Please call pharmacy.

## 2012-06-20 NOTE — Telephone Encounter (Signed)
Typically needs to be seen.  I will send in rx for muscle relaxant but if no improvement in next several days, needs to be seen.

## 2012-06-21 ENCOUNTER — Other Ambulatory Visit: Payer: Self-pay | Admitting: Family Medicine

## 2012-06-21 MED ORDER — METHOCARBAMOL 500 MG PO TABS: 500.0000 mg | ORAL_TABLET | Freq: Two times a day (BID) | ORAL | Status: DC | PRN

## 2012-06-21 NOTE — Telephone Encounter (Signed)
Called pharmacist, they were calling because of a reaction between cyclobenzaprine and multaq.  Flexeril script cancelled, [name] sent in robaxin instead.

## 2012-06-21 NOTE — Telephone Encounter (Signed)
It was NOT for cyclosporin it should have been for flexeril (cyclobenzaprine)!

## 2012-06-22 ENCOUNTER — Ambulatory Visit (INDEPENDENT_AMBULATORY_CARE_PROVIDER_SITE_OTHER): Payer: 59 | Admitting: Family Medicine

## 2012-06-22 VITALS — BP 110/74 | HR 72 | Temp 98.2°F | Wt 180.0 lb

## 2012-06-22 DIAGNOSIS — M549 Dorsalgia, unspecified: Secondary | ICD-10-CM

## 2012-06-22 DIAGNOSIS — R05 Cough: Secondary | ICD-10-CM

## 2012-06-22 MED ORDER — DEXAMETHASONE SOD PHOSPHATE PF 10 MG/ML IJ SOLN
10.0000 mg | Freq: Once | INTRAMUSCULAR | Status: AC
  Administered 2012-06-22: 10 mg via INTRAMUSCULAR

## 2012-06-22 MED ORDER — METHOCARBAMOL 500 MG PO TABS: 500.0000 mg | ORAL_TABLET | Freq: Four times a day (QID) | ORAL | Status: DC

## 2012-06-22 MED ORDER — CEFUROXIME AXETIL 250 MG PO TABS: 250.0000 mg | ORAL_TABLET | Freq: Two times a day (BID) | ORAL | Status: DC

## 2012-06-22 MED ORDER — HYDROCODONE-HOMATROPINE 5-1.5 MG/5ML PO SYRP: 5.0000 mL | ORAL_SOLUTION | Freq: Three times a day (TID) | ORAL | Status: DC | PRN

## 2012-06-22 NOTE — Patient Instructions (Addendum)
Good to see you, Monica Whitaker. I hope you have a nice holiday and get to see your family. If you do go to [place], please get your PT/INR checked in 7-10 days, sooner if you have any nose bleeds or any other bleeding.  Take Ceftin as directed- 1 tablet twice daily x 10 days.  Continue Mucinex.  Cheratussin as needed for cough.

## 2012-06-22 NOTE — Progress Notes (Signed)
66 yo with h/o hypothyroidism, a afib, h/o CVA on coumadin and Multaq, previous h/o PNA,  here for malaise, body aches, cough, runny nose for over a week.  Coughing so hard, she "threw out her back."  We did send in rx for robaxin which does help for a few hours, then wears off.  Pain is left lower back, near her buttocks.  No LLE weakness or radiculopathy.  No urinary symptoms.  NO fevers, CP or SOB.     Patient Active Problem List  Diagnosis  . HYPOTHYROIDISM  . ANXIETY DEPRESSION  . ATRIAL FIBRILLATION  . CEREBRAL EMBOLISM WITHOUT MENTION INFARCT  . MICROSCOPIC HEMATURIA  . MYALGIA  . DIZZINESS  . FEVER UNSPECIFIED  . FATIGUE / MALAISE  . PALPITATIONS  . Neck pain  . Edema  . Cough  . Alopecia  . Need for hepatitis C screening test  . UTI (lower urinary tract infection)  . Abnormal body temperature  . Back pain   Past Medical History  Diagnosis Date  . Anxiety   . Depression   . CVA (cerebral vascular accident) 12/2007  . 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 Atrial fibrillation     paroxysmal, failed medical therapy with flecainide,  NSVT with tikosyn   Past Surgical History  Procedure Date  . Vaginal hysterectomy   . Tonsillectomy    History  Substance Use Topics  . Smoking status: Never Smoker   . Smokeless tobacco: Never Used  . Alcohol Use: 0.5 oz/week    1 drink(s) per week   Family History  Problem Relation Age of Onset  . Multiple sclerosis Father    Allergies  Allergen Reactions  . Darvon   . Levofloxacin   . Propoxyphene-Acetaminophen    Current Outpatient Prescriptions on File Prior to Visit  Medication Sig Dispense Refill  . acetaminophen (TYLENOL) 500 MG tablet Take 2 tablets every 6 hours as needed      . calcium carbonate (OS-CAL) 600 MG TABS Take 600 mg by mouth 2 (two) times daily with a meal.        . cholecalciferol (VITAMIN D) 1000 UNITS tablet Take 1,000 Units by mouth daily.        Marland Kitchen  dextroamphetamine (DEXTROSTAT) 5 MG tablet Take 2.5 mg by mouth daily.       Marland Kitchen diltiazem (CARDIZEM CD) 180 MG 24 hr capsule Take 1 capsule (180 mg total) by mouth as needed.  90 capsule  3  . dronedarone (MULTAQ) 400 MG tablet Take 1 tablet (400 mg total) by mouth 2 (two) times daily with a meal.  180 tablet  3  . DULoxetine (CYMBALTA) 60 MG capsule Take 60 mg by mouth daily.      Marland Kitchen levothyroxine (SYNTHROID, LEVOTHROID) 25 MCG tablet Take 1 tablet (25 mcg total) by mouth daily.  90 tablet  3  . methocarbamol (ROBAXIN) 500 MG tablet Take 1 tablet (500 mg total) by mouth 2 (two) times daily as needed.  30 tablet  0  . Multiple Vitamin (MULTIVITAMIN) tablet Take 1 tablet by mouth daily.        Marland Kitchen omeprazole (PRILOSEC) 20 MG capsule Take 1 capsule (20 mg total) by mouth daily.  30 capsule  3  . Oral Electrolytes (BUFFERED SALT PO) Take by mouth.      . vitamin E 400 UNIT capsule Take 400 Units by mouth daily.        Marland Kitchen  warfarin (COUMADIN) 2.5 MG tablet Take as directed          The PMH, PSH, Social History, Family History, Medications, and allergies have been reviewed in [facility], and have been updated if relevant.   Review of Systems       See HPI   Physical Exam BP 110/74  Pulse 72  Temp 98.2 F (36.8 C)  Wt 180 lb (81.647 kg)  General:  GEN: nad, alert and oriented, constant wet cough, tearful. HEENT: mucous membranes moist,  Left TM is very erythematous, dull Right TM clear +PND NECK: supple w/o LA CV: rrr.  no murmur PULM: ctab, no inc wob but difficult for her to take deep inspirations without coughing ABD: soft, +bs EXT: no edema, mild right trapezius spasm, spurling negative, normal strength.   SKIN: no acute rash, alert & oriented X3, cranial nerves II-XII intact, strength normal in all extremities, sensation intact to light touch, and DTRs symmetrical and normal.   Psych:  Normal affect. MSK: No TTP over spine, SLR neg bilaterally, she TTP over left upper buttocks, very  painful with movement.  Assessment and Plan:   1. Cough  Given duration and progression of symptoms and her history, will treat with 10 day course of Ceftin for bacterial process- difficult to assess lungs due to coughing and back pain although lungs were clear on exam. Hycodan as needed. Continue mucinex. She is leaving for [place] tomorrow- given RX to have PT/INR checked in 7-10 days in [place].  dexamethasone Sod Phosphate PF SOLN 10 mg  2. Back pain  New- ?piriformis syndrome. IM decadron given in office today.  Continue Robaxin- will increase frequency.  Advised NOT to take with other sedation medication, like Hycodan. The patient indicates understanding of these issues and agrees with the plan.  dexamethasone Sod Phosphate PF SOLN 10 mg

## 2012-06-28 ENCOUNTER — Ambulatory Visit (INDEPENDENT_AMBULATORY_CARE_PROVIDER_SITE_OTHER): Payer: 59 | Admitting: Family Medicine

## 2012-06-28 ENCOUNTER — Telehealth: Payer: Self-pay | Admitting: *Deleted

## 2012-06-28 DIAGNOSIS — Z7902 Long term (current) use of antithrombotics/antiplatelets: Secondary | ICD-10-CM

## 2012-06-28 DIAGNOSIS — I4891 Unspecified atrial fibrillation: Secondary | ICD-10-CM

## 2012-06-28 DIAGNOSIS — Z5181 Encounter for therapeutic drug level monitoring: Secondary | ICD-10-CM

## 2012-06-28 NOTE — Telephone Encounter (Signed)
Call in   tussionex susp. 1 tsp po q 12 hours prn cough.  #240 mL, 0 refills  There is nothing else made at all stronger than this. Be cautious, as it can make you drowsy, recommend only taking at night.   Hannah Beat, MD 06/28/2012, 2:01 PM

## 2012-06-28 NOTE — Telephone Encounter (Signed)
rx called to pharmacy and patient advised 

## 2012-06-28 NOTE — Telephone Encounter (Signed)
Pt c/o cough, she is taking cough rx but it is not helping. She wants to know what kind of expectorant she can take. Since cough med isn't helping would you also consider prescribing something else for her.

## 2012-06-28 NOTE — Patient Instructions (Signed)
Hold coumadin today then decrease to 2.5 mg daily until finishes abx, then resume  2.5 mg daily, 5 mg Tu /Thurs check 2 week.

## 2012-07-05 ENCOUNTER — Ambulatory Visit: Payer: 59

## 2012-07-12 ENCOUNTER — Ambulatory Visit: Payer: 59

## 2012-07-14 ENCOUNTER — Other Ambulatory Visit (INDEPENDENT_AMBULATORY_CARE_PROVIDER_SITE_OTHER): Payer: Medicare Other

## 2012-07-14 ENCOUNTER — Other Ambulatory Visit: Payer: Self-pay | Admitting: *Deleted

## 2012-07-14 ENCOUNTER — Ambulatory Visit (INDEPENDENT_AMBULATORY_CARE_PROVIDER_SITE_OTHER): Payer: Medicare Other | Admitting: General Practice

## 2012-07-14 DIAGNOSIS — I4891 Unspecified atrial fibrillation: Secondary | ICD-10-CM

## 2012-07-14 DIAGNOSIS — Z5181 Encounter for therapeutic drug level monitoring: Secondary | ICD-10-CM

## 2012-07-14 LAB — PROTIME-INR: INR: 3.2 ratio — ABNORMAL HIGH (ref 0.8–1.0)

## 2012-07-14 NOTE — Telephone Encounter (Signed)
Opened in error

## 2012-08-04 ENCOUNTER — Ambulatory Visit (INDEPENDENT_AMBULATORY_CARE_PROVIDER_SITE_OTHER): Payer: Medicare Other | Admitting: General Practice

## 2012-08-04 DIAGNOSIS — I669 Occlusion and stenosis of unspecified cerebral artery: Secondary | ICD-10-CM

## 2012-08-04 DIAGNOSIS — I4891 Unspecified atrial fibrillation: Secondary | ICD-10-CM

## 2012-08-06 ENCOUNTER — Other Ambulatory Visit: Payer: Self-pay | Admitting: Family Medicine

## 2012-08-09 ENCOUNTER — Ambulatory Visit: Payer: Medicare Other

## 2012-08-11 ENCOUNTER — Ambulatory Visit: Payer: Medicare Other

## 2012-08-22 ENCOUNTER — Ambulatory Visit (INDEPENDENT_AMBULATORY_CARE_PROVIDER_SITE_OTHER): Payer: Medicare Other | Admitting: General Practice

## 2012-08-22 ENCOUNTER — Encounter: Payer: Self-pay | Admitting: Family Medicine

## 2012-08-22 DIAGNOSIS — Z5181 Encounter for therapeutic drug level monitoring: Secondary | ICD-10-CM

## 2012-08-22 DIAGNOSIS — I4891 Unspecified atrial fibrillation: Secondary | ICD-10-CM

## 2012-08-22 DIAGNOSIS — Z7902 Long term (current) use of antithrombotics/antiplatelets: Secondary | ICD-10-CM

## 2012-09-19 ENCOUNTER — Ambulatory Visit (INDEPENDENT_AMBULATORY_CARE_PROVIDER_SITE_OTHER): Payer: Medicare Other | Admitting: General Practice

## 2012-09-19 DIAGNOSIS — Z7902 Long term (current) use of antithrombotics/antiplatelets: Secondary | ICD-10-CM

## 2012-09-19 DIAGNOSIS — Z5181 Encounter for therapeutic drug level monitoring: Secondary | ICD-10-CM

## 2012-09-19 DIAGNOSIS — I4891 Unspecified atrial fibrillation: Secondary | ICD-10-CM

## 2012-09-19 LAB — POCT INR: INR: 3.7

## 2012-09-22 ENCOUNTER — Encounter: Payer: Self-pay | Admitting: Internal Medicine

## 2012-09-22 ENCOUNTER — Ambulatory Visit (INDEPENDENT_AMBULATORY_CARE_PROVIDER_SITE_OTHER): Payer: Medicare Other | Admitting: Internal Medicine

## 2012-09-22 VITALS — BP 101/73 | HR 70 | Ht 70.0 in | Wt 181.0 lb

## 2012-09-22 DIAGNOSIS — I4891 Unspecified atrial fibrillation: Secondary | ICD-10-CM

## 2012-09-22 LAB — CBC WITH DIFFERENTIAL/PLATELET
Basophils Relative: 0.6 % (ref 0.0–3.0)
Eosinophils Absolute: 0.2 10*3/uL (ref 0.0–0.7)
HCT: 41.5 % (ref 36.0–46.0)
Hemoglobin: 13.4 g/dL (ref 12.0–15.0)
Lymphocytes Relative: 39 % (ref 12.0–46.0)
MCHC: 32.3 g/dL (ref 30.0–36.0)
Monocytes Relative: 11 % (ref 3.0–12.0)
Neutro Abs: 1.9 10*3/uL (ref 1.4–7.7)
RBC: 4.47 Mil/uL (ref 3.87–5.11)

## 2012-09-22 LAB — BASIC METABOLIC PANEL
CO2: 29 mEq/L (ref 19–32)
Calcium: 9.5 mg/dL (ref 8.4–10.5)
Potassium: 4.2 mEq/L (ref 3.5–5.1)
Sodium: 139 mEq/L (ref 135–145)

## 2012-09-22 MED ORDER — RIVAROXABAN 20 MG PO TABS: 20.0000 mg | ORAL_TABLET | Freq: Every day | ORAL | Status: DC

## 2012-09-22 NOTE — Patient Instructions (Addendum)
Stop Coumadin Today.  LABS TODAY:  CBC, BMET, PT/INR  Xarelto 20mg  daily with your evening meal. (Once your INR is less than 3.0 you can start the Xarelto)  We will call you.  We plan to schedule you for an outpatient TEE on Monday, March 17th.  We plan to schedule you for an A-Fib Ablation on Tuesday March 18th.  You will hear from [place] in the next week with the details and instructions for your procedures.  If you have questions or have not heard from [place] in a week, call Dennis Bast, RN at [phone].

## 2012-09-23 ENCOUNTER — Telehealth: Payer: Self-pay | Admitting: Internal Medicine

## 2012-09-23 NOTE — Telephone Encounter (Signed)
Pt calling re questions re medication/mt

## 2012-09-23 NOTE — Telephone Encounter (Signed)
Ok to start tomorrow  INR was 3.0 yesterday

## 2012-09-25 NOTE — Progress Notes (Signed)
Monica Mannan, MD PCP  The patient presents today for routine electrophysiology followup.  Since last being seen in our clinic, the patient reports doing reasonably well.  She reports that her afib is increasing.  She reports having episodes of afib lasting up to 12 hours 1-2 times per month.  She is now reluctant to travel due to her afib.  Today, she denies symptoms of palpitations, chest pain, shortness of breath, orthopnea, PND, lower extremity edema, dizziness, presyncope, syncope, or neurologic sequela.  The patient feels that she is tolerating medications without difficulties and is otherwise without complaint today.   Past Medical History  Diagnosis Date  . Anxiety   . Depression   . CVA (cerebral vascular accident) 12/2007  . 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 Atrial fibrillation     paroxysmal, failed medical therapy with flecainide,  NSVT with tikosyn   Past Surgical History  Procedure Laterality Date  . Vaginal hysterectomy    . Tonsillectomy      Current Outpatient Prescriptions  Medication Sig Dispense Refill  . acetaminophen (TYLENOL) 500 MG tablet Take 2 tablets every 6 hours as needed      . calcium carbonate (OS-CAL) 600 MG TABS Take 600 mg by mouth 2 (two) times daily with a meal.        . cholecalciferol (VITAMIN D) 1000 UNITS tablet Take 1,000 Units by mouth daily.        Marland Kitchen dextroamphetamine (DEXTROSTAT) 5 MG tablet Take 2.5 mg by mouth daily.       Marland Kitchen dronedarone (MULTAQ) 400 MG tablet Take 1 tablet (400 mg total) by mouth 2 (two) times daily with a meal.  180 tablet  3  . DULoxetine (CYMBALTA) 60 MG capsule Take 60 mg by mouth daily.      Marland Kitchen levothyroxine (SYNTHROID, LEVOTHROID) 25 MCG tablet TAKE 1 TABLET (25 MCG TOTAL) BY MOUTH DAILY.  90 tablet  1  . Multiple Vitamin (MULTIVITAMIN) tablet Take 1 tablet by mouth daily.        Marland Kitchen omeprazole (PRILOSEC) 20 MG capsule Take 1 capsule (20 mg total) by mouth daily.  30 capsule  3  . Oral  Electrolytes (BUFFERED SALT PO) Take by mouth.      . vitamin E 400 UNIT capsule Take 400 Units by mouth daily.        Marland Kitchen diltiazem (CARDIZEM CD) 180 MG 24 hr capsule Take 1 capsule (180 mg total) by mouth as needed.  90 capsule  3  . HYDROcodone-homatropine (HYCODAN) 5-1.5 MG/5ML syrup Take 5 mLs by mouth every 8 (eight) hours as needed for cough.  120 mL  0  . methocarbamol (ROBAXIN) 500 MG tablet Take 1 tablet (500 mg total) by mouth 4 (four) times daily.  30 tablet  0  . Rivaroxaban (XARELTO) 20 MG TABS Take 1 tablet (20 mg total) by mouth daily.  30 tablet  11   No current facility-administered medications for this visit.    Allergies  Allergen Reactions  . Darvon   . Levofloxacin   . Propoxyphene-Acetaminophen     History   Social History  . Marital Status: Married    Spouse Name: N/A    Number of Children: 0  . Years of Education: N/A   Occupational History  . Retired    Social History Main Topics  . Smoking status: Never Smoker   . Smokeless tobacco: Never Used  . Alcohol Use: 0.5  oz/week    1 drink(s) per week  . Drug Use: No  . Sexually Active: Not on file   Other Topics Concern  . Not on file   Social History Narrative   Lives in [place] with her husband.  No children.  Former Retail buyer    Family History  Problem Relation Age of Onset  . Multiple sclerosis Father     Physical Exam: Filed Vitals:   09/22/12 1142  BP: 101/73  Pulse: 70  Height: 5\' 10"  (1.778 m)  Weight: 181 lb (82.101 kg)    GEN- The patient is well appearing, alert and oriented x 3 today.   Head- normocephalic, atraumatic Eyes-  Sclera clear, conjunctiva pink Ears- hearing intact Oropharynx- clear Neck- supple, no JVP Lymph- no cervical lymphadenopathy Lungs- Clear to ausculation bilaterally, normal work of breathing Heart- Regular rate and rhythm, no murmurs, rubs or gallops, PMI not laterally displaced GI- soft, NT, ND, + BS Extremities- no clubbing, cyanosis, or  edema  ekg today reveals sinus rhythm 66 bpm, otherwise normal ekg, Qtc 448  Assessment and Plan:

## 2012-09-25 NOTE — Assessment & Plan Note (Signed)
She continues to have increasing frequency and duration of atrial fibrillation despite multaq.  She has failed medical therapy with Ics and had NSVT with tikosyn.  Therapeutic strategies for afib including medicine and ablation were discussed in detail with the patient today. Risk, benefits, and alternatives to EP study and radiofrequency ablation for afib were also discussed in detail today. These risks include but are not limited to stroke, bleeding, vascular damage, tamponade, perforation, damage to the esophagus, lungs, and other structures, pulmonary vein stenosis, worsening renal function, and death. The patient understands these risk and wishes to proceed.  INRs have been labile recently.  We will therefore switch from coumadin to xarelto.  We will therefore proceed with catheter ablation once the patient has been adequately anticoagulated.

## 2012-09-29 ENCOUNTER — Ambulatory Visit: Payer: Medicare Other | Admitting: Internal Medicine

## 2012-09-29 ENCOUNTER — Telehealth: Payer: Self-pay | Admitting: Internal Medicine

## 2012-09-29 ENCOUNTER — Encounter: Payer: Self-pay | Admitting: *Deleted

## 2012-09-29 ENCOUNTER — Other Ambulatory Visit: Payer: Self-pay | Admitting: *Deleted

## 2012-09-29 DIAGNOSIS — I4891 Unspecified atrial fibrillation: Secondary | ICD-10-CM

## 2012-10-03 ENCOUNTER — Ambulatory Visit (INDEPENDENT_AMBULATORY_CARE_PROVIDER_SITE_OTHER): Payer: Medicare Other | Admitting: General Practice

## 2012-10-03 ENCOUNTER — Encounter (HOSPITAL_COMMUNITY): Payer: Self-pay | Admitting: Pharmacy Technician

## 2012-10-10 ENCOUNTER — Ambulatory Visit: Payer: Medicare Other

## 2012-10-11 ENCOUNTER — Other Ambulatory Visit (INDEPENDENT_AMBULATORY_CARE_PROVIDER_SITE_OTHER): Payer: Medicare Other

## 2012-10-11 DIAGNOSIS — I4891 Unspecified atrial fibrillation: Secondary | ICD-10-CM

## 2012-10-11 LAB — CBC WITH DIFFERENTIAL/PLATELET
Basophils Absolute: 0 10*3/uL (ref 0.0–0.1)
Eosinophils Absolute: 0.4 10*3/uL (ref 0.0–0.7)
Hemoglobin: 13.7 g/dL (ref 12.0–15.0)
Lymphocytes Relative: 39.7 % (ref 12.0–46.0)
Lymphs Abs: 1.7 10*3/uL (ref 0.7–4.0)
MCHC: 33.3 g/dL (ref 30.0–36.0)
Neutro Abs: 1.7 10*3/uL (ref 1.4–7.7)
Platelets: 268 10*3/uL (ref 150.0–400.0)
RDW: 14.1 % (ref 11.5–14.6)

## 2012-10-11 LAB — BASIC METABOLIC PANEL
Calcium: 9.5 mg/dL (ref 8.4–10.5)
Chloride: 101 mEq/L (ref 96–112)
Creatinine, Ser: 0.8 mg/dL (ref 0.4–1.2)
Sodium: 137 mEq/L (ref 135–145)

## 2012-10-18 ENCOUNTER — Encounter (HOSPITAL_COMMUNITY): Payer: Self-pay | Admitting: Certified Registered"

## 2012-10-18 ENCOUNTER — Encounter (HOSPITAL_COMMUNITY)
Admission: RE | Disposition: A | Discharge status: Home / Self Care | Payer: Self-pay | Source: Ambulatory Visit | Attending: Cardiology

## 2012-10-18 ENCOUNTER — Ambulatory Visit (HOSPITAL_COMMUNITY): Payer: Medicare Other | Admitting: Certified Registered"

## 2012-10-18 ENCOUNTER — Ambulatory Visit (HOSPITAL_COMMUNITY)
Admission: RE | Admit: 2012-10-18 | Discharge: 2012-10-19 | Disposition: A | Discharge status: Home / Self Care | Payer: Medicare Other | Source: Ambulatory Visit | Attending: Cardiology | Admitting: Cardiology

## 2012-10-18 ENCOUNTER — Encounter (HOSPITAL_COMMUNITY): Payer: Self-pay | Admitting: *Deleted

## 2012-10-18 DIAGNOSIS — I4891 Unspecified atrial fibrillation: Secondary | ICD-10-CM | POA: Insufficient documentation

## 2012-10-18 DIAGNOSIS — I480 Paroxysmal atrial fibrillation: Secondary | ICD-10-CM | POA: Diagnosis present

## 2012-10-18 HISTORY — PX: ATRIAL FIBRILLATION ABLATION: SHX5732

## 2012-10-18 HISTORY — PX: TEE WITHOUT CARDIOVERSION: SHX5443

## 2012-10-18 HISTORY — PX: ATRIAL FIBRILLATION ABLATION: SHX5456

## 2012-10-18 LAB — POCT ACTIVATED CLOTTING TIME
Activated Clotting Time: 236 seconds
Activated Clotting Time: 279 seconds
Activated Clotting Time: 306 seconds
Activated Clotting Time: 209 seconds
Activated Clotting Time: 187 seconds

## 2012-10-18 SURGERY — ECHOCARDIOGRAM, TRANSESOPHAGEAL
Anesthesia: Moderate Sedation

## 2012-10-18 SURGERY — ATRIAL FIBRILLATION ABLATION
Anesthesia: Monitor Anesthesia Care

## 2012-10-18 MED ORDER — BUTAMBEN-TETRACAINE-BENZOCAINE 2-2-14 % EX AERO
INHALATION_SPRAY | CUTANEOUS | Status: DC | PRN
  Administered 2012-10-18 (×2): 1 via TOPICAL

## 2012-10-18 MED ORDER — OXYCODONE HCL 5 MG PO TABS
5.0000 mg | ORAL_TABLET | Freq: Once | ORAL | Status: AC | PRN
  Administered 2012-10-18: 5 mg via ORAL
  Filled 2012-10-18: qty 1

## 2012-10-18 MED ORDER — METHOCARBAMOL 500 MG PO TABS
500.0000 mg | ORAL_TABLET | Freq: Four times a day (QID) | ORAL | Status: DC
  Filled 2012-10-18: qty 1

## 2012-10-18 MED ORDER — LACTATED RINGERS IV SOLN
INTRAVENOUS | Status: DC | PRN
  Administered 2012-10-18 (×2): via INTRAVENOUS

## 2012-10-18 MED ORDER — MIDAZOLAM HCL 10 MG/2ML IJ SOLN
INTRAMUSCULAR | Status: DC | PRN
  Administered 2012-10-18 (×3): 2 mg via INTRAVENOUS

## 2012-10-18 MED ORDER — HYDROCODONE-HOMATROPINE 5-1.5 MG/5ML PO SYRP: 5.0000 mL | ORAL_SOLUTION | Freq: Three times a day (TID) | ORAL | Status: DC | PRN

## 2012-10-18 MED ORDER — SODIUM CHLORIDE 0.9 % IV SOLN: 250.0000 mL | INTRAVENOUS | Status: DC | PRN

## 2012-10-18 MED ORDER — HEPARIN SODIUM (PORCINE) 1000 UNIT/ML IJ SOLN
INTRAMUSCULAR | Status: DC | PRN
  Administered 2012-10-18: 3000 [IU] via INTRAVENOUS

## 2012-10-18 MED ORDER — OXYCODONE HCL 5 MG PO TABS
5.0000 mg | ORAL_TABLET | ORAL | Status: DC | PRN
  Administered 2012-10-19 (×2): 5 mg via ORAL
  Filled 2012-10-18 (×2): qty 1

## 2012-10-18 MED ORDER — ACETAMINOPHEN 325 MG PO TABS: 650.0000 mg | ORAL_TABLET | Freq: Four times a day (QID) | ORAL | Status: DC | PRN

## 2012-10-18 MED ORDER — SODIUM CHLORIDE 0.9 % IV SOLN
INTRAVENOUS | Status: DC
  Administered 2012-10-18: 500 mL via INTRAVENOUS

## 2012-10-18 MED ORDER — FENTANYL CITRATE 0.05 MG/ML IJ SOLN
INTRAMUSCULAR | Status: DC | PRN
  Administered 2012-10-18: 50 ug via INTRAVENOUS
  Administered 2012-10-18 (×3): 25 ug via INTRAVENOUS
  Administered 2012-10-18: 50 ug via INTRAVENOUS

## 2012-10-18 MED ORDER — MEPERIDINE HCL 25 MG/ML IJ SOLN: 6.2500 mg | INTRAMUSCULAR | Status: DC | PRN

## 2012-10-18 MED ORDER — DRONEDARONE HCL 400 MG PO TABS
400.0000 mg | ORAL_TABLET | Freq: Two times a day (BID) | ORAL | Status: DC
  Administered 2012-10-19: 400 mg via ORAL
  Filled 2012-10-18 (×3): qty 1

## 2012-10-18 MED ORDER — MIDAZOLAM HCL 5 MG/ML IJ SOLN
INTRAMUSCULAR | Status: AC
  Filled 2012-10-18: qty 2

## 2012-10-18 MED ORDER — LEVOTHYROXINE SODIUM 25 MCG PO TABS
25.0000 ug | ORAL_TABLET | Freq: Every day | ORAL | Status: DC
  Administered 2012-10-19: 25 ug via ORAL
  Filled 2012-10-18 (×2): qty 1

## 2012-10-18 MED ORDER — BUPIVACAINE HCL (PF) 0.25 % IJ SOLN
INTRAMUSCULAR | Status: AC
  Filled 2012-10-18: qty 30

## 2012-10-18 MED ORDER — PROTAMINE SULFATE 10 MG/ML IV SOLN
INTRAVENOUS | Status: DC | PRN
  Administered 2012-10-18: 30 mg via INTRAVENOUS

## 2012-10-18 MED ORDER — ONDANSETRON HCL 4 MG/2ML IJ SOLN: 4.0000 mg | Freq: Four times a day (QID) | INTRAMUSCULAR | Status: DC | PRN

## 2012-10-18 MED ORDER — HEPARIN SODIUM (PORCINE) 1000 UNIT/ML IJ SOLN
INTRAMUSCULAR | Status: AC
  Filled 2012-10-18: qty 1

## 2012-10-18 MED ORDER — MIDAZOLAM HCL 5 MG/5ML IJ SOLN
INTRAMUSCULAR | Status: DC | PRN
  Administered 2012-10-18: 2 mg via INTRAVENOUS

## 2012-10-18 MED ORDER — FENTANYL CITRATE 0.05 MG/ML IJ SOLN
INTRAMUSCULAR | Status: AC
  Filled 2012-10-18: qty 2

## 2012-10-18 MED ORDER — ONDANSETRON HCL 4 MG/2ML IJ SOLN: 4.0000 mg | Freq: Once | INTRAMUSCULAR | Status: AC | PRN

## 2012-10-18 MED ORDER — FENTANYL CITRATE 0.05 MG/ML IJ SOLN
INTRAMUSCULAR | Status: DC | PRN
  Administered 2012-10-18 (×2): 25 ug via INTRAVENOUS

## 2012-10-18 MED ORDER — OXYCODONE HCL 5 MG/5ML PO SOLN: 5.0000 mg | Freq: Once | ORAL | Status: AC | PRN

## 2012-10-18 MED ORDER — HYDROXYUREA 500 MG PO CAPS
ORAL_CAPSULE | ORAL | Status: AC
  Filled 2012-10-18: qty 1

## 2012-10-18 MED ORDER — SODIUM CHLORIDE 0.9 % IJ SOLN: 3.0000 mL | INTRAMUSCULAR | Status: DC | PRN

## 2012-10-18 MED ORDER — HYDROMORPHONE HCL PF 1 MG/ML IJ SOLN: 0.2500 mg | INTRAMUSCULAR | Status: DC | PRN

## 2012-10-18 MED ORDER — RIVAROXABAN 20 MG PO TABS
20.0000 mg | ORAL_TABLET | Freq: Every day | ORAL | Status: DC
  Administered 2012-10-18: 20 mg via ORAL
  Filled 2012-10-18 (×2): qty 1

## 2012-10-18 MED ORDER — DULOXETINE HCL 60 MG PO CPEP
60.0000 mg | ORAL_CAPSULE | Freq: Every day | ORAL | Status: DC
  Filled 2012-10-18: qty 1

## 2012-10-18 MED ORDER — PROPOFOL INFUSION 10 MG/ML OPTIME
INTRAVENOUS | Status: DC | PRN
  Administered 2012-10-18: 75 ug/kg/min via INTRAVENOUS

## 2012-10-18 MED ORDER — SODIUM CHLORIDE 0.9 % IJ SOLN
3.0000 mL | Freq: Two times a day (BID) | INTRAMUSCULAR | Status: DC
  Administered 2012-10-18: 3 mL via INTRAVENOUS

## 2012-10-18 NOTE — Preoperative (Signed)
Beta Blockers   Reason not to administer Beta Blockers:Not Applicable 

## 2012-10-18 NOTE — Anesthesia Postprocedure Evaluation (Signed)
  Anesthesia Post-op Note  Patient: Monica Whitaker  Procedure(s) Performed: Procedure(s): ATRIAL FIBRILLATION ABLATION (N/A)  Patient Location: Cath Lab  Anesthesia Type:MAC  Level of Consciousness: awake, alert  and oriented  Airway and Oxygen Therapy: Patient Spontanous Breathing  Post-op Pain: none  Post-op Assessment: Post-op Vital signs reviewed, Patient's Cardiovascular Status Stable, Respiratory Function Stable, Patent Airway, No signs of Nausea or vomiting, Adequate PO intake and Pain level controlled  Post-op Vital Signs: Reviewed and stable  Complications: No apparent anesthesia complications

## 2012-10-18 NOTE — H&P (Signed)
CHRISY HILLEBRAND  09/22/2012 11:45 AM   Office Visit  MRN:  [id number]   Description: 67 year old female  Provider: Hillis Range, MD  Department: [facility] [address]        Referring Provider    Dianne Dun, MD      Diagnoses    A-fib    -  Primary    [phone]    Atrial fibrillation        427.31      Reason for Visit    Appointment     have been in and out of a-fib,          Progress Notes    Hillis Range, MD at 09/25/2012  3:55 PM    Status: Signed                   Ruthe Mannan, MD PCP   The patient presents today for routine electrophysiology followup.  Since last being seen in our clinic, the patient reports doing reasonably well.  She reports that her afib is increasing.  She reports having episodes of afib lasting up to 12 hours 1-2 times per month.  She is now reluctant to travel due to her afib.  Today, she denies symptoms of palpitations, chest pain, shortness of breath, orthopnea, PND, lower extremity edema, dizziness, presyncope, syncope, or neurologic sequela.  The patient feels that she is tolerating medications without difficulties and is otherwise without complaint today.     Past Medical History   Diagnosis  Date   .  Anxiety     .  Depression     .  CVA (cerebral vascular accident)  12/2007   .  Ventricular tachycardia         pt told at [facility] that she had RVOT VT   .  Atrial fibrillation         paroxysmal, failed medical therapy with flecainide,  NSVT with tikosyn       Past Surgical History   Procedure  Laterality  Date   .  Vaginal hysterectomy       .  Tonsillectomy             Current Outpatient Prescriptions   Medication  Sig  Dispense  Refill   .  acetaminophen (TYLENOL) 500 MG tablet  Take 2 tablets every 6 hours as needed         .  calcium carbonate (OS-CAL) 600 MG TABS  Take 600 mg by mouth 2 (two) times daily with a meal.           .  cholecalciferol (VITAMIN D) 1000 UNITS tablet  Take 1,000 Units by mouth daily.           Marland Kitchen   dextroamphetamine (DEXTROSTAT) 5 MG tablet  Take 2.5 mg by mouth daily.          Marland Kitchen  dronedarone (MULTAQ) 400 MG tablet  Take 1 tablet (400 mg total) by mouth 2 (two) times daily with a meal.   180 tablet   3   .  DULoxetine (CYMBALTA) 60 MG capsule  Take 60 mg by mouth daily.         Marland Kitchen  levothyroxine (SYNTHROID, LEVOTHROID) 25 MCG tablet  TAKE 1 TABLET (25 MCG TOTAL) BY MOUTH DAILY.   90 tablet   1   .  Multiple Vitamin (MULTIVITAMIN) tablet  Take 1 tablet by mouth daily.           Marland Kitchen  omeprazole (PRILOSEC) 20 MG capsule  Take 1 capsule (20 mg total) by mouth daily.   30 capsule   3   .  Oral Electrolytes (BUFFERED SALT PO)  Take by mouth.         .  vitamin E 400 UNIT capsule  Take 400 Units by mouth daily.           Marland Kitchen  diltiazem (CARDIZEM CD) 180 MG 24 hr capsule  Take 1 capsule (180 mg total) by mouth as needed.   90 capsule   3   .  HYDROcodone-homatropine (HYCODAN) 5-1.5 MG/5ML syrup  Take 5 mLs by mouth every 8 (eight) hours as needed for cough.   120 mL   0   .  methocarbamol (ROBAXIN) 500 MG tablet  Take 1 tablet (500 mg total) by mouth 4 (four) times daily.   30 tablet   0   .  Rivaroxaban (XARELTO) 20 MG TABS  Take 1 tablet (20 mg total) by mouth daily.   30 tablet   11       No current facility-administered medications for this visit.         Allergies   Allergen  Reactions   .  Darvon     .  Levofloxacin     .  Propoxyphene-Acetaminophen           History       Social History   .  Marital Status:  Married       Spouse Name:  N/A       Number of Children:  0   .  Years of Education:  N/A       Occupational History   .  Retired         Social History Main Topics   .  Smoking status:  Never Smoker    .  Smokeless tobacco:  Never Used   .  Alcohol Use:  0.5 oz/week       1 drink(s) per week   .  Drug Use:  No   .  Sexually Active:  Not on file       Other Topics  Concern   .  Not on file       Social History Narrative     Lives in [place] with her  husband.  No children.  Former Retail buyer         Family History   Problem  Relation  Age of Onset   .  Multiple sclerosis  Father          Physical Exam: Filed Vitals:     09/22/12 1142   BP:  101/73   Pulse:  70   Height:  5\' 10"  (1.778 m)   Weight:  181 lb (82.101 kg)        GEN- The patient is well appearing, alert and oriented x 3 today.    Head- normocephalic, atraumatic Eyes-  Sclera clear, conjunctiva pink Ears- hearing intact Oropharynx- clear Neck- supple, no JVP Lymph- no cervical lymphadenopathy Lungs- Clear to ausculation bilaterally, normal work of breathing Heart- Regular rate and rhythm, no murmurs, rubs or gallops, PMI not laterally displaced GI- soft, NT, ND, + BS Extremities- no clubbing, cyanosis, or edema   ekg today reveals sinus rhythm 66 bpm, otherwise normal ekg, Qtc 448   Assessment and Plan:            ATRIAL FIBRILLATION - Hillis Range, MD at 09/25/2012  3:55  PM    Status: Written Related Problem: ATRIAL FIBRILLATION           She continues to have increasing frequency and duration of atrial fibrillation despite multaq.  She has failed medical therapy with Ics and had NSVT with tikosyn.  Therapeutic strategies for afib including medicine and ablation were discussed in detail with the patient today. Risk, benefits, and alternatives to EP study and radiofrequency ablation for afib were also discussed in detail today. These risks include but are not limited to stroke, bleeding, vascular damage, tamponade, perforation, damage to the esophagus, lungs, and other structures, pulmonary vein stenosis, worsening renal function, and death. The patient understands these risk and wishes to proceed.  INRs have been labile recently.  We will therefore switch from coumadin to xarelto.  We will therefore proceed with catheter ablation once the patient has been adequately anticoagulated.     For TEE prior to atrial fibrillation ablation; no  changes Olga Millers

## 2012-10-18 NOTE — CV Procedure (Signed)
See full TEE report in [employer]; normal LV function; moderate LAE; mild spontaneous contrast in LA but no LAA thrombus identified; mild MR. Olga Millers

## 2012-10-18 NOTE — H&P (View-Only) (Signed)
Monica Mannan, MD PCP  The patient presents today for routine electrophysiology followup.  Since last being seen in our clinic, the patient reports doing reasonably well.  She reports that her afib is increasing.  She reports having episodes of afib lasting up to 12 hours 1-2 times per month.  She is now reluctant to travel due to her afib.  Today, she denies symptoms of palpitations, chest pain, shortness of breath, orthopnea, PND, lower extremity edema, dizziness, presyncope, syncope, or neurologic sequela.  The patient feels that she is tolerating medications without difficulties and is otherwise without complaint today.   Past Medical History  Diagnosis Date  . Anxiety   . Depression   . CVA (cerebral vascular accident) 12/2007  . Ventricular tachycardia     pt told at [facility] that she had RVOT VT  . Atrial fibrillation     paroxysmal, failed medical therapy with flecainide,  NSVT with tikosyn   Past Surgical History  Procedure Laterality Date  . Vaginal hysterectomy    . Tonsillectomy      Current Outpatient Prescriptions  Medication Sig Dispense Refill  . acetaminophen (TYLENOL) 500 MG tablet Take 2 tablets every 6 hours as needed      . calcium carbonate (OS-CAL) 600 MG TABS Take 600 mg by mouth 2 (two) times daily with a meal.        . cholecalciferol (VITAMIN D) 1000 UNITS tablet Take 1,000 Units by mouth daily.        Marland Kitchen dextroamphetamine (DEXTROSTAT) 5 MG tablet Take 2.5 mg by mouth daily.       Marland Kitchen dronedarone (MULTAQ) 400 MG tablet Take 1 tablet (400 mg total) by mouth 2 (two) times daily with a meal.  180 tablet  3  . DULoxetine (CYMBALTA) 60 MG capsule Take 60 mg by mouth daily.      Marland Kitchen levothyroxine (SYNTHROID, LEVOTHROID) 25 MCG tablet TAKE 1 TABLET (25 MCG TOTAL) BY MOUTH DAILY.  90 tablet  1  . Multiple Vitamin (MULTIVITAMIN) tablet Take 1 tablet by mouth daily.        Marland Kitchen omeprazole (PRILOSEC) 20 MG capsule Take 1 capsule (20 mg total) by mouth daily.  30 capsule  3  . Oral  Electrolytes (BUFFERED SALT PO) Take by mouth.      . vitamin E 400 UNIT capsule Take 400 Units by mouth daily.        Marland Kitchen diltiazem (CARDIZEM CD) 180 MG 24 hr capsule Take 1 capsule (180 mg total) by mouth as needed.  90 capsule  3  . HYDROcodone-homatropine (HYCODAN) 5-1.5 MG/5ML syrup Take 5 mLs by mouth every 8 (eight) hours as needed for cough.  120 mL  0  . methocarbamol (ROBAXIN) 500 MG tablet Take 1 tablet (500 mg total) by mouth 4 (four) times daily.  30 tablet  0  . Rivaroxaban (XARELTO) 20 MG TABS Take 1 tablet (20 mg total) by mouth daily.  30 tablet  11   No current facility-administered medications for this visit.    Allergies  Allergen Reactions  . Darvon   . Levofloxacin   . Propoxyphene-Acetaminophen     History   Social History  . Marital Status: Married    Spouse Name: N/A    Number of Children: 0  . Years of Education: N/A   Occupational History  . Retired    Social History Main Topics  . Smoking status: Never Smoker   . Smokeless tobacco: Never Used  . Alcohol Use: 0.5  oz/week    1 drink(s) per week  . Drug Use: No  . Sexually Active: Not on file   Other Topics Concern  . Not on file   Social History Narrative   Lives in [place] with her husband.  No children.  Former Retail buyer    Family History  Problem Relation Age of Onset  . Multiple sclerosis Father     Physical Exam: Filed Vitals:   09/22/12 1142  BP: 101/73  Pulse: 70  Height: 5\' 10"  (1.778 m)  Weight: 181 lb (82.101 kg)    GEN- The patient is well appearing, alert and oriented x 3 today.   Head- normocephalic, atraumatic Eyes-  Sclera clear, conjunctiva pink Ears- hearing intact Oropharynx- clear Neck- supple, no JVP Lymph- no cervical lymphadenopathy Lungs- Clear to ausculation bilaterally, normal work of breathing Heart- Regular rate and rhythm, no murmurs, rubs or gallops, PMI not laterally displaced GI- soft, NT, ND, + BS Extremities- no clubbing, cyanosis, or  edema  ekg today reveals sinus rhythm 66 bpm, otherwise normal ekg, Qtc 448  Assessment and Plan:

## 2012-10-18 NOTE — Interval H&P Note (Signed)
History and Physical Interval Note:  10/18/2012 10:52 AM  Monica Whitaker  has presented today for surgery, with the diagnosis of a-fib  The various methods of treatment have been discussed with the patient and family. After consideration of risks, benefits and other options for treatment, the patient has consented to  Procedure(s): ATRIAL FIBRILLATION ABLATION (N/A) as a surgical intervention .  The patient's history has been reviewed, patient examined, no change in status, stable for surgery.  I have reviewed the patient's chart and labs.  Questions were answered to the patient's satisfaction.   She reports compliance with xarelto, uninterupted. TEE today reveals no thrombus.  Hillis Range

## 2012-10-18 NOTE — Op Note (Signed)
SURGEON:  Hillis Range, MD  PREPROCEDURE DIAGNOSES: 1. Paroxysmal atrial fibrillation.  POSTPROCEDURE DIAGNOSES: 1. Paroxysmal  atrial fibrillation.  PROCEDURES: 1. Comprehensive electrophysiologic study. 2. Coronary sinus pacing and recording. 3. Three-dimensional mapping of atrial fibrillation 4. Ablation of atrial fibrillation 5. Intracardiac echocardiography. 6. Transseptal puncture of an intact septum. 7. Rotational Angiography with processing at an independent workstation 8. Arrhythmia induction with pacing with isuprel infusion  INTRODUCTION:  Monica Whitaker is a 67 y.o. female with a history of paroxysmal atrial fibrillation who now presents for EP study and radiofrequency ablation.  The patient reports initially being diagnosed with atrial fibrillation several years ago after presenting with symptomatic palpitations and fatgiue. The patient reports increasing frequency and duration of atrial fibrillation since that time.  The patient has failed medical therapy with Multaq.  The patient therefore presents today for catheter ablation of atrial fibrillation.  DESCRIPTION OF PROCEDURE:  Informed written consent was obtained, and the patient was brought to the electrophysiology lab in a fasting state.  The patient was adequately sedated with intravenous medications as outlined in the anesthesia report.  The patient's left and right groins were prepped and draped in the usual sterile fashion by the EP lab staff.  Using a percutaneous Seldinger technique, two 7-French and one 11-French hemostasis sheaths were placed into the right common femoral vein.    3 Dimensional Rotational Angiography: A 5 french pigtail catheter was introduced through the right common femoral vein and advanced into the inferior venocava.  3 demential rotational angiography was then performed by power injection of 100cc of nonionic contrast.  Reprocessing at an independent work station was then performed.   This  demonstrated a moderate sized left atrium with 4 separate pulmonary veins which were also moderate in size.  There were no anomalous veins or significant abnormalities.  A 3 dimensional rendering of the left atrium was then merged using [employer] onto the Well[employer] system and registered with intracardiac echo (see below).  The pigtail catheter was then removed.  Catheter Placement:  A 7-French Biosense Webster Decapolar coronary sinus catheter was introduced through the right common femoral vein and advanced into the coronary sinus for recording and pacing from this location.  A 6-French quadripolar Josephson catheter was introduced through the right common femoral vein and advanced into the right ventricle for recording and pacing.  This catheter was then pulled back to the His bundle location.    Initial Measurements: The patient presented to the electrophysiology lab in sinus rhythm.  Her PR interval measured 156 msec with a QRS duringation of 112 msec and a QT interval of 500 msec.  The AH interval measured 59 and the HV interval measured 48 msec.     Intracardiac Echocardiography: A 10-French Biosense Webster AcuNav intracardiac echocardiography catheter was introduced through the left common femoral vein and advanced into the right atrium. Intracardiac echocardiography was performed of the left atrium, and a three-dimensional anatomical rendering of the left atrium was performed using CARTO sound technology.  The patient was noted to have a moderate sized left atrium.  The interatrial septum was aneurysmal. All 4 pulmonary veins were visualized and noted to have separate ostia.  The pulmonary veins were moderate in size.  The left atrial appendage was visualized and did not reveal thrombus.   There was no evidence of pulmonary vein stenosis.   Transseptal Puncture: The middle right common femoral vein sheath was exchanged for an 8.5 [place] SL2 transseptal sheath and transseptal  access was achieved in a standard fashion using a Brockenbrough needle under biplane fluoroscopy with intracardiac echocardiography confirmation of the transseptal puncture.  Once transseptal access had been achieved, heparin was administered intravenously and intra- arterially in order to maintain an ACT of greater than 300 seconds throughout the procedure.   3D Mapping and Ablation: The His bundle catheter was removed and in its place a 3.5 mm Biosense Webster EZ [employer] ablation catheter was advanced into the right atrium.  The transseptal sheath was pulled back into the IVC over a guidewire.  The ablation catheter was advanced across the transseptal hole using the wire as a guide.  The transseptal sheath was then re-advanced over the guidewire into the left atrium.  A duodecapolar Biosense Webster circular mapping catheter was introduced through the transseptal sheath and positioned over the mouth of all 4 pulmonary veins.  Three-dimensional electroanatomical mapping was performed using CARTO technology.  This demonstrated electrical activity within all four pulmonary veins at baseline. The patient underwent successful sequential electrical isolation and anatomical encircling of all four pulmonary veins using radiofrequency current with a circular mapping catheter as a guide.    Measurements Following Ablation: Following ablation, Isuprel was infused up to 20 mcg/min with no inducible atrial fibrillation, atrial tachycardia, atrial flutter, or sustained PACs. In sinus rhythm with RR interval was 649, with PR 118 msec, QRS 106 msec, and Qtc 400 msec. Ventricular pacing was performed, which revealed midline decremental VA conduction with a VA Wenckebach cycle length of 340 msec. VEST was performed which revealed midline concentric decremental VA conduction with no retrograde jumps, echo beats, or tachycardias observed. Rapid atrial pacing was performed, which revealed an AV Wenckebach cycle  length of 240 msec.  Electroisolation was then again confirmed in all four pulmonary veins.  The procedure was therefore considered completed.  All catheters were removed, and the sheaths were aspirated and flushed.  The patient was transferred to the recovery area for sheath removal per protocol.  A limited bedside transthoracic echocardiogram revealed no pericardial effusion.  There were no early apparent complications.  CONCLUSIONS: 1. Sinus rhythm upon presentation.   2. Rotational Angiography reveals a moderate sized left atrium with four separate pulmonary veins without evidence of pulmonary vein stenosis. 3. Successful electrical isolation and anatomical encircling of all four pulmonary veins with radiofrequency current. 4. No inducible arrhythmias following ablation both on and off of Isuprel 5. No early apparent complications.   Drey Shaff,MD 2:27 PM 10/18/2012

## 2012-10-18 NOTE — Transfer of Care (Signed)
Immediate Anesthesia Transfer of Care Note  Patient: Monica Whitaker  Procedure(s) Performed: Procedure(s): ATRIAL FIBRILLATION ABLATION (N/A)  Patient Location: Cath Lab  Anesthesia Type:mac  Level of Consciousness: awake, alert  and oriented  Airway & Oxygen Therapy: Patient Spontanous Breathing  Post-op Assessment: Report given to PACU RN, Post -op Vital signs reviewed and stable and Patient moving all extremities  Post vital signs: Reviewed and stable  Complications: No apparent anesthesia complications

## 2012-10-18 NOTE — Interval H&P Note (Signed)
History and Physical Interval Note:  10/18/2012 10:15 AM  Monica Whitaker  has presented today for surgery, with the diagnosis of Afib  The various methods of treatment have been discussed with the patient and family. After consideration of risks, benefits and other options for treatment, the patient has consented to  Procedure(s) with comments: TRANSESOPHAGEAL ECHOCARDIOGRAM (TEE) (N/A) - Pre-Ablation at 12pm as a surgical intervention .  The patient's history has been reviewed, patient examined, no change in status, stable for surgery.  I have reviewed the patient's chart and labs.  Questions were answered to the patient's satisfaction.     Olga Millers

## 2012-10-18 NOTE — Progress Notes (Signed)
*  PRELIMINARY RESULTS* Echocardiogram TEE has been performed.  Jeryl Columbia 10/18/2012, 12:00 PM

## 2012-10-18 NOTE — Addendum Note (Signed)
Addendum created 10/18/12 1512 by Aubery Lapping, MD   Modules edited: Anesthesia Attestations, Anesthesia Events

## 2012-10-18 NOTE — Anesthesia Preprocedure Evaluation (Signed)
Anesthesia Evaluation  Patient identified by MRN, date of birth, ID band Patient awake    Reviewed: Allergy & Precautions, H&P , NPO status , Patient's Chart, lab work & pertinent test results  Airway Mallampati: I TM Distance: >3 FB Neck ROM: Full    Dental   Pulmonary          Cardiovascular + dysrhythmias Atrial Fibrillation     Neuro/Psych    GI/Hepatic   Endo/Other    Renal/GU      Musculoskeletal   Abdominal   Peds  Hematology   Anesthesia Other Findings   Reproductive/Obstetrics                           Anesthesia Physical Anesthesia Plan  ASA: III  Anesthesia Plan: MAC   Post-op Pain Management:    Induction: Intravenous  Airway Management Planned: Natural Airway  Additional Equipment:   Intra-op Plan:   Post-operative Plan:   Informed Consent: I have reviewed the patients History and Physical, chart, labs and discussed the procedure including the risks, benefits and alternatives for the proposed anesthesia with the patient or authorized representative who has indicated his/her understanding and acceptance.     Plan Discussed with: CRNA and Surgeon  Anesthesia Plan Comments:         Anesthesia Quick Evaluation

## 2012-10-19 ENCOUNTER — Telehealth: Payer: Self-pay | Admitting: Internal Medicine

## 2012-10-19 ENCOUNTER — Encounter (HOSPITAL_COMMUNITY): Payer: Self-pay | Admitting: Cardiology

## 2012-10-19 DIAGNOSIS — I4891 Unspecified atrial fibrillation: Secondary | ICD-10-CM

## 2012-10-19 LAB — BASIC METABOLIC PANEL
CO2: 26 mEq/L (ref 19–32)
Calcium: 8.5 mg/dL (ref 8.4–10.5)
GFR calc Af Amer: >90 mL/min (ref >90)
GFR calc non Af Amer: 90 mL/min — ABNORMAL LOW (ref >90)
Sodium: 137 mEq/L (ref 135–145)

## 2012-10-19 MED ORDER — PANTOPRAZOLE SODIUM 40 MG PO TBEC: 40.0000 mg | DELAYED_RELEASE_TABLET | Freq: Every day | ORAL | Status: DC

## 2012-10-19 NOTE — Telephone Encounter (Signed)
Discussed with Dr Johney Frame, anesthesia can sometimes cause headaches and the symptoms she is having.  He advises to keep hydrated and rest and these symptoms should pass  If they do not she is asked to call her PCP

## 2012-10-19 NOTE — Discharge Summary (Signed)
ELECTROPHYSIOLOGY DISCHARGE SUMMARY    Patient ID: Monica Whitaker,  MRN: [id number], DOB/AGE: 66-22-1947 66 y.o.  Admit date: 10/18/2012 Discharge date: 10/19/2012  Primary Care Physician: Ruthe Mannan, MD Primary Cardiologist: Hillis Range, MD  Primary Discharge Diagnosis:  1. Symptomatic AF s/p AF ablation 2. Orthostatic hypotension  Secondary Discharge Diagnoses:  1. Prior CVA 2. RVOT VT 3. Anxiety/depression  Procedures This Admission:  1. TEE - See full TEE report in [facility]; normal LV function; moderate LAE; mild spontaneous contrast in LA but no LAA thrombus identified; mild MR 2. EPS +AF ablation - Comprehensive electrophysiologic study  - Coronary sinus pacing and recording  - Three-dimensional mapping of atrial fibrillation  - Ablation of atrial fibrillation  - Intracardiac echocardiography  - Transseptal puncture of an intact septum  - Rotational Angiography with processing at an independent workstation  - Arrhythmia induction with pacing with isuprel infusion CONCLUSIONS:  1. Sinus rhythm upon presentation.  2. Rotational Angiography reveals a moderate sized left atrium with four separate pulmonary veins without evidence of pulmonary vein stenosis.  3. Successful electrical isolation and anatomical encircling of all four pulmonary veins with radiofrequency current.  4. No inducible arrhythmias following ablation both on and off of Isuprel  5. No early apparent complications.  History and Hospital Course:  Monica Whitaker is a 67 year old woman with symptomatic paroxysmal atrial fibrillation who reports initially being diagnosed with atrial fibrillation several years ago after presenting with symptomatic palpitations and fatgiue. She has experienced increasing frequency and duration of atrial fibrillation since that time. She has failed medical therapy with Multaq and Tikosyn. Ms. Ternes therefore elected EPS +RF ablation and presented yesterday for catheter  ablation of atrial fibrillation. She tolerated this procedure well without any immediate complication. She remains hemodynamically stable and afebrile. Her groin site is intact without significant bleeding or hematoma. Telemetry shows normal sinus rhythm. She has been given discharge instructions including wound care and activity restrictions. She will follow-up in clinic in 12 weeks. She has been seen, examined and deemed stable for discharge today by Dr. Hillis Range.  Physical Exam: Vitals: Blood pressure 92/43, pulse 69, temperature 99.3 F (37.4 C), temperature source Oral, resp. rate 10, height 5\' 10"  (1.778 m), weight 175 lb (79.379 kg), SpO2 91.00%.  General: WD, well appearing 67 year old female in no acute distress. Heart: RRR. S1, S2 without murmur, rub or S3. Lungs: CTA bilaterally. No wheezes, rales or rhonchi. Abdomen: Soft, nondistended. Extremities: No cyanosis, clubbing or edema. Right groin intact without bleeding or hematoma.  Labs: Lab Results  Component Value Date   WBC 4.3* 10/11/2012   HGB 13.7 10/11/2012   HCT 41.1 10/11/2012   MCV 91.5 10/11/2012   PLT 268.0 10/11/2012     Recent Labs Lab 10/19/12 0420  NA 137  K 4.0  CL 103  CO2 26  BUN 12  CREATININE 0.68  CALCIUM 8.5  GLUCOSE 104*    Disposition:  The patient is being discharged in stable condition.  Follow-up:     Follow-up Information   Follow up with Hillis Range, MD On 01/23/2013. (At 9:30 AM)    Contact information:   [address] [place] [address] [phone]     Discharge Medications:    Medication List    TAKE these medications       acetaminophen 500 MG tablet  Commonly known as:  TYLENOL  Take 2 tablets every 6 hours as needed  BUFFERED SALT PO  Take by mouth.     calcium carbonate 600 MG Tabs  Commonly known as:  OS-CAL  Take 600 mg by mouth 2 (two) times daily with a meal.     cholecalciferol 1000 UNITS tablet  Commonly known as:  VITAMIN D    Take 1,000 Units by mouth daily.     dextroamphetamine 5 MG tablet  Commonly known as:  DEXTROSTAT  Take 2.5 mg by mouth daily.     diltiazem 180 MG 24 hr capsule  Commonly known as:  CARDIZEM CD  Take 1 capsule (180 mg total) by mouth as needed.     dronedarone 400 MG tablet  Commonly known as:  MULTAQ  Take 1 tablet (400 mg total) by mouth 2 (two) times daily with a meal.     DULoxetine 60 MG capsule  Commonly known as:  CYMBALTA  Take 60 mg by mouth daily.     HYDROcodone-homatropine 5-1.5 MG/5ML syrup  Commonly known as:  HYCODAN  Take 5 mLs by mouth every 8 (eight) hours as needed for cough.     levothyroxine 25 MCG tablet  Commonly known as:  SYNTHROID, LEVOTHROID  TAKE 1 TABLET (25 MCG TOTAL) BY MOUTH DAILY.     methocarbamol 500 MG tablet  Commonly known as:  ROBAXIN  Take 1 tablet (500 mg total) by mouth 4 (four) times daily.     multivitamin tablet  Take 1 tablet by mouth daily.     pantoprazole 40 MG tablet  Commonly known as:  PROTONIX  Take 1 tablet (40 mg total) by mouth daily. For 6 weeks.     Rivaroxaban 20 MG Tabs  Commonly known as:  XARELTO  Take 1 tablet (20 mg total) by mouth daily.     vitamin E 400 UNIT capsule  Take 400 Units by mouth daily.       Duration of Discharge Encounter: Greater than 30 minutes including physician time.  Limmie Patricia, PA-C 10/19/2012, 7:19 AM   Hillis Range, MD

## 2012-10-19 NOTE — Telephone Encounter (Signed)
New problem   Pt was discharged from hospital today for ablation and is having trouble with her vision and need to talk to a nurse ASAP.

## 2012-10-19 NOTE — Progress Notes (Signed)
Utilization Review Completed.Monica Whitaker T3/19/2014

## 2012-10-23 ENCOUNTER — Encounter: Payer: Self-pay | Admitting: Internal Medicine

## 2012-10-25 ENCOUNTER — Telehealth: Payer: Self-pay | Admitting: Internal Medicine

## 2012-10-25 NOTE — Telephone Encounter (Signed)
Spoke with patient she understands that if she is not feeling any better by Thursday she should come in and have an EKG ( told her it was ok to have in [place] if she wanted). She understands there is a few months of healing where it might not be "quite right"  She does want Dr Johney Frame to know she read the info about the Xarelto she started and saw it says no alcohol at all and she does drink a glass to a glass and a half every night.

## 2012-10-25 NOTE — Telephone Encounter (Signed)
New Problem:    Patient called in wanting to know if it was normal to be in Afib after an Ablation.  Please call back.

## 2012-11-02 ENCOUNTER — Other Ambulatory Visit: Payer: Self-pay | Admitting: Family Medicine

## 2012-11-18 ENCOUNTER — Ambulatory Visit (INDEPENDENT_AMBULATORY_CARE_PROVIDER_SITE_OTHER): Payer: Medicare Other

## 2012-11-18 VITALS — BP 100/60 | HR 131 | Ht 69.0 in | Wt 180.0 lb

## 2012-11-18 DIAGNOSIS — I4891 Unspecified atrial fibrillation: Secondary | ICD-10-CM

## 2012-11-18 MED ORDER — DILTIAZEM HCL ER COATED BEADS 180 MG PO CP24: 180.0000 mg | ORAL_CAPSULE | ORAL | Status: DC | PRN

## 2012-11-18 NOTE — Progress Notes (Signed)
Pt here for EKG at request of Dr. Jenel Lucks office Pt had atrial fib ablation in March. She is concerned about how often she is going in and out of atrial fib I explained this can be normal for a few weeks post ablation She is asymptomatic but "I know when I go into atrial fib and I have been in this for 24 hours now" She has not taken her cardizem today  She has also felt well enough to go to 2 exercise classes today  EKG performed Shows atrial fib at a rate of 131 BPM BP=100/60  I paged Dr. Graciela Husbands since Dr. Johney Frame is off. He says to "have pt wear an event monitor so we can see if patient is going in and out of atrial fib or if she is staying in atrial fib with fast-slow rates.  Call to assess patient on Monday 11/21/12 to reassess. If she is still in atrial fib, we may need to cardiovert her" VO Dr. Anderson Malta, RN  I informed pt of this plan She is refusing the monitor since she tells me she "knows when I am in atrial fib.  I am going in and out. I know when I am in normal rhythm" I advised her to take cardizem when she gets home as prescribed and we will follow up 4/21.  Understanding verb  Will forward this to Dr. Caralyn Guile

## 2012-11-21 ENCOUNTER — Telehealth: Payer: Self-pay

## 2012-11-21 ENCOUNTER — Other Ambulatory Visit: Payer: Self-pay

## 2012-11-21 ENCOUNTER — Encounter: Payer: Self-pay | Admitting: *Deleted

## 2012-11-21 MED ORDER — AMIODARONE HCL 200 MG PO TABS: ORAL_TABLET | ORAL | Status: DC

## 2012-11-21 NOTE — Telephone Encounter (Signed)
See below

## 2012-11-21 NOTE — Telephone Encounter (Signed)
Message copied by Marcelle Overlie on Mon Nov 21, 2012  8:19 AM ------      Message from: Hillis Range      Created: Sat Nov 19, 2012 10:32 AM      Regarding: RE: Ander Purpura, for some reason, I could not addend your note on Ms Bright.            This soon after ablation, I dont think that a monitor would be a good idea.   We need to adjust her antiarrhythmic medicine in order to control her afib.      I would therefore not recommend a monitor for her.      I would recommend that we stop multaq and start amiodarone.      I would recommend amidoarone 400mg  BID x 5 days, then 200mg  BID x 3 weeks.  IF she remains in afib at that point then she will require cardioversion.                              ----- Message -----         From: Marcelle Overlie, RN         Sent: 11/18/2012   2:48 PM           To: Hillis Range, MD, Deliah Boston, RN      Subject: Lorain Childes                                                      Please see note from 11/18/12      Thanks!       ------

## 2012-11-21 NOTE — Telephone Encounter (Signed)
Pt informed Understanding verb i will send note to Dr. Jenel Lucks nurse to make appt with Dr. Johney Frame in 1 month to f/u for amiodarone f/u I sent RX to pharmacy

## 2012-11-23 ENCOUNTER — Telehealth: Payer: Self-pay | Admitting: Internal Medicine

## 2012-11-23 NOTE — Telephone Encounter (Signed)
Tried calling home and cell numbers, was not able to in the AM, but I did leave a message she could try me this afternoon and if I don't here from her I will try my best between patients in the AM to call her.   I did look to see if she has tried Amiodarone in the past and all I see is Flecainide and Multaq. She might be mistaking it to one of these medications.

## 2012-11-23 NOTE — Telephone Encounter (Signed)
Called pt to set up 1 month fu for amiodarone per staff message, pt had a concern that she has tried this med before and it didn't work, would like to talk with sherri about this and see if she can go back and check in her chart if she has taken amiodarone before, pls call in the am @ [phone]

## 2012-11-25 NOTE — Telephone Encounter (Signed)
Spoke with pt she had a few concerns about side effects and if she would have to take long term. Pt states that she was told skin would turn yellow in the sun, it does make you sensitive to the sun per Dr Graciela Husbands never heard of the yellow part. She was also concerned about liver problems because of info on TV and I assured her that we do blood work periodically to make sure that doesn't happen. She will discuss any further questions with Dr Johney Frame at her appointment on 5/11.

## 2012-11-25 NOTE — Telephone Encounter (Signed)
Tried home and cell numbers - left messages both numbers

## 2012-12-20 DIAGNOSIS — L570 Actinic keratosis: Secondary | ICD-10-CM

## 2012-12-20 DIAGNOSIS — C4491 Basal cell carcinoma of skin, unspecified: Secondary | ICD-10-CM

## 2012-12-20 HISTORY — DX: Actinic keratosis: L57.0

## 2012-12-20 HISTORY — DX: Basal cell carcinoma of skin, unspecified: C44.91

## 2012-12-21 ENCOUNTER — Telehealth: Payer: Self-pay | Admitting: *Deleted

## 2012-12-21 NOTE — Telephone Encounter (Signed)
Patient states she has broken her toe for the third or fourth time and is asking who she should see for this, an orthopedic or podiatrist.  Please advise.

## 2012-12-22 ENCOUNTER — Encounter: Payer: Self-pay | Admitting: Internal Medicine

## 2012-12-22 ENCOUNTER — Ambulatory Visit (INDEPENDENT_AMBULATORY_CARE_PROVIDER_SITE_OTHER): Payer: Medicare Other | Admitting: Internal Medicine

## 2012-12-22 VITALS — BP 105/71 | HR 64 | Ht 70.0 in | Wt 181.0 lb

## 2012-12-22 DIAGNOSIS — I4891 Unspecified atrial fibrillation: Secondary | ICD-10-CM

## 2012-12-22 LAB — HEPATIC FUNCTION PANEL
ALT: 15 U/L (ref 0–35)
Albumin: 4.1 g/dL (ref 3.5–5.2)
Total Protein: 7 g/dL (ref 6.0–8.3)

## 2012-12-22 LAB — TSH: TSH: 0.88 u[IU]/mL (ref 0.35–5.50)

## 2012-12-22 LAB — T4, FREE: Free T4: 1.14 ng/dL (ref 0.60–1.60)

## 2012-12-22 MED ORDER — AMIODARONE HCL 200 MG PO TABS: 200.0000 mg | ORAL_TABLET | Freq: Every day | ORAL | Status: DC

## 2012-12-22 NOTE — Progress Notes (Signed)
PCP: Ruthe Mannan, MD  Monica Whitaker is a 67 y.o. female who presents today for routine electrophysiology followup.  Since her recent afib ablation, the patient reports doing very well.  She did have ERAF but is now doing better with amiodarone.  Today, she denies symptoms of palpitations, chest pain, shortness of breath,  lower extremity edema, dizziness, presyncope, or syncope.  The patient is otherwise without complaint today.   Past Medical History  Diagnosis Date  . Anxiety   . Depression   . CVA (cerebral vascular accident) 12/2007  . Ventricular tachycardia     pt told at [facility] that she had RVOT VT  . Atrial fibrillation     paroxysmal, failed medical therapy with flecainide,  NSVT with tikosyn   Past Surgical History  Procedure Laterality Date  . Vaginal hysterectomy    . Tonsillectomy    . Eye surgery    . Tee without cardioversion N/A 10/18/2012    Procedure: TRANSESOPHAGEAL ECHOCARDIOGRAM (TEE);  Surgeon: Lewayne Bunting, MD;  Location[facility] ENDOSCOPY;  Service: Cardiovascular;  Laterality: N/A;  Pre-Ablation at 12pm  . Atrial fibrillation ablation  10/18/12    PVI by Dr Johney Frame    Current Outpatient Prescriptions  Medication Sig Dispense Refill  . acetaminophen (TYLENOL) 500 MG tablet Take 2 tablets every 6 hours as needed      . amiodarone (PACERONE) 200 MG tablet Take 2 tablets twice daily x 5 days then take 1 tablet twice daily x 3 weeks  62 tablet  3  . calcium carbonate (OS-CAL) 600 MG TABS Take 600 mg by mouth 2 (two) times daily with a meal.        . cholecalciferol (VITAMIN D) 1000 UNITS tablet Take 1,000 Units by mouth daily.        Marland Kitchen dextroamphetamine (DEXTROSTAT) 5 MG tablet Take 2.5 mg by mouth daily.       Marland Kitchen diltiazem (CARDIZEM CD) 180 MG 24 hr capsule Take 1 capsule (180 mg total) by mouth as needed.  90 capsule  3  . DULoxetine (CYMBALTA) 60 MG capsule Take 60 mg by mouth daily.      Marland Kitchen HYDROcodone-homatropine (HYCODAN) 5-1.5 MG/5ML syrup Take 5 mLs by mouth  every 8 (eight) hours as needed for cough.  120 mL  0  . levothyroxine (SYNTHROID, LEVOTHROID) 25 MCG tablet TAKE 1 TABLET (25 MCG TOTAL) BY MOUTH DAILY.  90 tablet  0  . methocarbamol (ROBAXIN) 500 MG tablet Take 1 tablet (500 mg total) by mouth 4 (four) times daily.  30 tablet  0  . Multiple Vitamin (MULTIVITAMIN) tablet Take 1 tablet by mouth daily.        . Oral Electrolytes (BUFFERED SALT PO) Take by mouth.      . pantoprazole (PROTONIX) 40 MG tablet Take 1 tablet (40 mg total) by mouth daily. For 6 weeks.  30 tablet  2  . Rivaroxaban (XARELTO) 20 MG TABS Take 1 tablet (20 mg total) by mouth daily.  30 tablet  11  . vitamin E 400 UNIT capsule Take 400 Units by mouth daily.         No current facility-administered medications for this visit.    Physical Exam: Filed Vitals:   12/22/12 1101  BP: 105/71  Pulse: 64  Height: 5\' 10"  (1.778 m)  Weight: 181 lb (82.101 kg)  SpO2: 98%    GEN- The patient is well appearing, alert and oriented x 3 today.   Head- normocephalic, atraumatic Eyes-  Sclera clear, conjunctiva pink Ears- hearing intact Oropharynx- clear Lungs- Clear to ausculation bilaterally, normal work of breathing Heart- Regular rate and rhythm, no murmurs, rubs or gallops, PMI not laterally displaced GI- soft, NT, ND, + BS Extremities- no clubbing, cyanosis, or edema  ekg today reveals sinus rhythm 61 bpm, otherwise normal ekg  Assessment and Plan:  1. afib Doing well s/p ablation Decrease amiodarone to 200mg  daily today Continue xarelto Lfts/tfts  Return in 3 months

## 2012-12-22 NOTE — Addendum Note (Signed)
Addended by: Dennis Bast F on: 12/22/2012 11:44 AM   Modules accepted: Orders

## 2012-12-22 NOTE — Telephone Encounter (Signed)
Advised patient, but she said she would rather see a podiatrist.  I suggested Dr. Al Corpus in [place].  Advised her she should not need referral, but to please call us if she does.

## 2012-12-22 NOTE — Telephone Encounter (Signed)
She could see either one but I would recommend seeing ortho.

## 2012-12-22 NOTE — Patient Instructions (Addendum)
Your physician recommends that you schedule a follow-up appointment in: 3 months with Dr Johney Frame   Your physician recommends that you have lab work today: LIVER/TSH/T4  Your physician has recommended you make the following change in your medication:  1) Decrease Amiodarone 200mg  daily

## 2013-01-02 ENCOUNTER — Encounter: Payer: Self-pay | Admitting: Family Medicine

## 2013-01-02 ENCOUNTER — Ambulatory Visit (INDEPENDENT_AMBULATORY_CARE_PROVIDER_SITE_OTHER): Payer: Medicare Other | Admitting: Family Medicine

## 2013-01-02 VITALS — BP 112/82 | HR 68 | Temp 98.3°F | Wt 179.8 lb

## 2013-01-02 DIAGNOSIS — R194 Change in bowel habit: Secondary | ICD-10-CM | POA: Insufficient documentation

## 2013-01-02 DIAGNOSIS — R198 Other specified symptoms and signs involving the digestive system and abdomen: Secondary | ICD-10-CM

## 2013-01-02 MED ORDER — DOCUSATE SODIUM 100 MG PO CAPS: 100.0000 mg | ORAL_CAPSULE | Freq: Every day | ORAL | Status: DC | PRN

## 2013-01-02 MED ORDER — POLYETHYLENE GLYCOL 3350 17 GM/SCOOP PO POWD: 17.0000 g | Freq: Every day | ORAL | Status: DC | PRN

## 2013-01-02 NOTE — Patient Instructions (Addendum)
Sign release form for colonoscopy around 2011 from [place], [place]. Start miralax daily as needed, hold for diarrhea.  Only works if you have good water intake. May use colace (docusate) stool softener as needed. Try to avoid regular use of laxatives like senna and dulcolax. If persistent bowel issues despite above or any abdominal pain, let us know for further evaluation.

## 2013-01-02 NOTE — Assessment & Plan Note (Signed)
In setting of recent commencement of amiodarone and xarelto. Amiodarone is listed as 10-33% change of constipation - anticipate due to this. Will change stool regimen - discussed avoidance of regular laxative use. Start miralax and colace daily as needed. Red flags to return discussed - discussed in detail if not improved with above will need to return for further evaluation. Will request latest colonoscopy to update chart.

## 2013-01-02 NOTE — Progress Notes (Signed)
  Subjective:    Patient ID: Monica Whitaker, female    DOB: March 11, 1946, 67 y.o.   MRN: [id number]  HPI CC: stomach issues  Pleasant patient of Dr. Elmer Sow with history of parox afib presents today with stoomach concerns.   No h/o GI issues for years.  Then for last few months, noticing increased stool urgency.  Last week had LARGE stool - started taking sennakot 2/2 constipation.  Last BM was this morning, harder than normal.  Mild abdominal discomfort recently. No fevers/chills, nausea, diarrhea. No increased indigestion, gassiness or bloating.  No blood in stool.  No early satiety.  Weight stable.  Change in diet - increased strawberries recently.  Lab Results  Component Value Date   CREATININE 0.68 10/19/2012   Lab Results  Component Value Date   TSH 0.88 12/22/2012    Last colonoscopy around 2011 - in [place].  Normal per pt.  No diverticulosis and no polyps.  On amiodarone, xarelto, and ditiazem, dextroamphetamine, cymbalta, and levothyroxine.  On tussionex prn cough, not recently. Recently started amiodarone 2 mo ago.    Wt Readings from Last 3 Encounters:  01/02/13 179 lb 12 oz (81.534 kg)  12/22/12 181 lb (82.101 kg)  11/18/12 180 lb (81.647 kg)    Past Medical History  Diagnosis Date  . Anxiety   . Depression   . CVA (cerebral vascular accident) 12/2007  . Ventricular tachycardia     pt told at [facility] that she had RVOT VT  . Atrial fibrillation     paroxysmal, failed medical therapy with flecainide,  NSVT with tikosyn     Review of Systems Per HPI    Objective:   Physical Exam  Nursing note and vitals reviewed. Constitutional: She appears well-developed and well-nourished. No distress.  HENT:  Mouth/Throat: Oropharynx is clear and moist. No oropharyngeal exudate.  Eyes: Conjunctivae and EOM are normal. Pupils are equal, round, and reactive to light. No scleral icterus.  Cardiovascular: Normal rate, regular rhythm, normal heart sounds and intact distal  pulses.   No murmur heard. Pulmonary/Chest: Effort normal and breath sounds normal. No respiratory distress. She has no wheezes. She has no rales.  Abdominal: Soft. Normal appearance and bowel sounds are normal. She exhibits no distension and no mass. There is no hepatosplenomegaly. There is no tenderness. There is no rigidity, no rebound, no guarding, no CVA tenderness and negative Murphy's sign. No hernia.       Assessment & Plan:

## 2013-01-04 ENCOUNTER — Encounter: Payer: Self-pay | Admitting: Family Medicine

## 2013-01-04 ENCOUNTER — Telehealth: Payer: Self-pay | Admitting: Family Medicine

## 2013-01-04 ENCOUNTER — Ambulatory Visit (INDEPENDENT_AMBULATORY_CARE_PROVIDER_SITE_OTHER)
Admission: RE | Admit: 2013-01-04 | Discharge: 2013-01-04 | Disposition: A | Discharge status: Home / Self Care | Payer: Medicare Other | Source: Ambulatory Visit | Attending: Family Medicine | Admitting: Family Medicine

## 2013-01-04 DIAGNOSIS — R194 Change in bowel habit: Secondary | ICD-10-CM

## 2013-01-04 DIAGNOSIS — R198 Other specified symptoms and signs involving the digestive system and abdomen: Secondary | ICD-10-CM

## 2013-01-04 NOTE — Addendum Note (Signed)
Addended by: Eustaquio Boyden on: 01/04/2013 02:19 PM   Modules accepted: Orders

## 2013-01-04 NOTE — Telephone Encounter (Signed)
Patient was seen by Dr. Sharen Hones on Monday and she said the prescriptions she was given aren't working.  Patient said she's very miserable.  Patient uses [facility].  Please call patient.

## 2013-01-04 NOTE — Telephone Encounter (Signed)
Seen today - see xray results.

## 2013-01-04 NOTE — Telephone Encounter (Signed)
Spoke with patient - persistent constipation.  No stool in 3 days.  Tried fleet's enema this morning, no stool. No abd pain.  No fevers.  However, not passing gas. I've asked her to come in today for abd xray to r/o obstruction - if concerns will recommend ER evaluation.

## 2013-01-06 ENCOUNTER — Telehealth: Payer: Self-pay

## 2013-01-06 NOTE — Telephone Encounter (Signed)
plz notify - let's stop milk of magnesia and docusate. May continue miralax, but cut down to once daily, may stop if diarrhea continues. I'm glad she's feeling better.

## 2013-01-06 NOTE — Telephone Encounter (Signed)
Patient notified

## 2013-01-06 NOTE — Telephone Encounter (Signed)
Pt seen 01/02/13; pt is much better with constipation; pt still has tenderness in RLQ of abdomen, pain level 3-4 now. Pt taking miralax,colace and MOM as instructed. Loose watery stools started approx 24 hrs ago. Watery BMs x 8 during last night and 10 so far today. No blood or mucus in stool, no fever and no N or V. Pt wants to know since watery stools what med should she be taking or not taking.[facility]. Please advise. Pt request cb.

## 2013-01-18 ENCOUNTER — Other Ambulatory Visit: Payer: Self-pay | Admitting: Family Medicine

## 2013-01-23 ENCOUNTER — Encounter: Payer: Medicare Other | Admitting: Internal Medicine

## 2013-01-31 ENCOUNTER — Telehealth: Payer: Self-pay | Admitting: Internal Medicine

## 2013-01-31 NOTE — Telephone Encounter (Signed)
Spoke with patient and let her know she can take plain Robitussin OTC for cough needs to stay away from OTC's with decongestants in them  She is aware

## 2013-01-31 NOTE — Telephone Encounter (Signed)
New problem    Pt has medication question

## 2013-02-01 ENCOUNTER — Ambulatory Visit: Payer: Medicare Other | Admitting: Family Medicine

## 2013-02-02 ENCOUNTER — Encounter: Payer: Self-pay | Admitting: Family Medicine

## 2013-02-02 ENCOUNTER — Ambulatory Visit (INDEPENDENT_AMBULATORY_CARE_PROVIDER_SITE_OTHER): Payer: Medicare Other | Admitting: Family Medicine

## 2013-02-02 VITALS — BP 100/70 | HR 76 | Temp 98.3°F | Wt 181.0 lb

## 2013-02-02 DIAGNOSIS — J069 Acute upper respiratory infection, unspecified: Secondary | ICD-10-CM

## 2013-02-02 MED ORDER — CEFUROXIME AXETIL 250 MG PO TABS: 250.0000 mg | ORAL_TABLET | Freq: Two times a day (BID) | ORAL | Status: AC

## 2013-02-02 MED ORDER — FLUTICASONE-SALMETEROL 100-50 MCG/DOSE IN AEPB: 1.0000 | INHALATION_SPRAY | Freq: Two times a day (BID) | RESPIRATORY_TRACT | Status: DC

## 2013-02-02 MED ORDER — HYDROCODONE-HOMATROPINE 5-1.5 MG/5ML PO SYRP: 5.0000 mL | ORAL_SOLUTION | Freq: Three times a day (TID) | ORAL | Status: DC | PRN

## 2013-02-02 NOTE — Patient Instructions (Addendum)
Good to see you. Please take ceftin as directed- 1 tablet twice daily for 10 days. Try advair if you can tolerate it for a few days to help with inflammation.  Call me or send a my chart message next week with an update.

## 2013-02-02 NOTE — Progress Notes (Signed)
67 yo with h/o hypothyroidism, afib s/p ablation,  previous h/o PNA,  here for productive cough and HA x 1 week.  Has had low grade fever since yesterday.  No CP or persistent SOB.  Did feel herself go into a few for a few seconds after having a coughing fit.    Patient Active Problem List   Diagnosis Date Noted  . Bowel habit changes 01/02/2013  . Back pain 06/22/2012  . Abnormal body temperature 04/11/2012  . UTI (lower urinary tract infection) 03/04/2012  . Alopecia 08/24/2011  . Need for hepatitis C screening test 08/24/2011  . Cough 04/09/2011  . Edema 01/01/2011  . Neck pain 12/08/2010  . FEVER UNSPECIFIED 06/16/2010  . MICROSCOPIC HEMATURIA 06/06/2010  . MYALGIA 05/16/2010  . HYPOTHYROIDISM 04/10/2010  . ANXIETY DEPRESSION 04/10/2010  . CEREBRAL EMBOLISM WITHOUT MENTION INFARCT 04/10/2010  . Atrial fibrillation 03/11/2010  . DIZZINESS 03/11/2010  . FATIGUE / MALAISE 03/11/2010  . PALPITATIONS 03/11/2010   Past Medical History  Diagnosis Date  . Anxiety   . Depression   . CVA (cerebral vascular accident) 12/2007  . Ventricular tachycardia     pt told at [facility] that she had RVOT VT  . Atrial fibrillation     paroxysmal, failed medical therapy with flecainide,  NSVT with tikosyn   Past Surgical History  Procedure Laterality Date  . Vaginal hysterectomy    . Tonsillectomy    . Eye surgery    . Tee without cardioversion N/A 10/18/2012    Procedure: TRANSESOPHAGEAL ECHOCARDIOGRAM (TEE);  Surgeon: Lewayne Bunting, MD;  Location: [facility] ENDOSCOPY;  Service: Cardiovascular;  Laterality: N/A;  Pre-Ablation at 12pm  . Atrial fibrillation ablation  10/18/12    PVI by Dr Johney Frame   History  Substance Use Topics  . Smoking status: Never Smoker   . Smokeless tobacco: Never Used  . Alcohol Use: 0.5 oz/week    1 drink(s) per week     Comment: regular   Family History  Problem Relation Age of Onset  . Multiple sclerosis Father    Allergies  Allergen Reactions  . Darvon    . Levofloxacin Other (See Comments)    Causes panic attacks.  . Propoxyphene-Acetaminophen    Current Outpatient Prescriptions on File Prior to Visit  Medication Sig Dispense Refill  . acetaminophen (TYLENOL) 500 MG tablet Take 2 tablets every 6 hours as needed      . amiodarone (PACERONE) 200 MG tablet Take 1 tablet (200 mg total) by mouth daily.  62 tablet  3  . calcium carbonate (OS-CAL) 600 MG TABS Take 600 mg by mouth 2 (two) times daily with a meal.        . cholecalciferol (VITAMIN D) 1000 UNITS tablet Take 1,000 Units by mouth daily.        Marland Kitchen dextroamphetamine (DEXTROSTAT) 5 MG tablet Take 5 mg by mouth daily.       Marland Kitchen diltiazem (CARDIZEM CD) 180 MG 24 hr capsule Take 1 capsule (180 mg total) by mouth as needed.  90 capsule  3  . docusate sodium (COLACE) 100 MG capsule Take 1 capsule (100 mg total) by mouth daily as needed for constipation.  30 capsule  0  . DULoxetine (CYMBALTA) 60 MG capsule Take 60 mg by mouth daily.      Marland Kitchen HYDROcodone-homatropine (HYCODAN) 5-1.5 MG/5ML syrup Take 5 mLs by mouth every 8 (eight) hours as needed for cough.  120 mL  0  . levothyroxine (SYNTHROID, LEVOTHROID) 25 MCG tablet  TAKE 1 TABLET (25 MCG TOTAL) BY MOUTH DAILY.  90 tablet  1  . Multiple Vitamin (MULTIVITAMIN) tablet Take 1 tablet by mouth daily.        . Oral Electrolytes (BUFFERED SALT PO) Take by mouth.      . polyethylene glycol powder (GLYCOLAX/MIRALAX) powder Take 17 g by mouth daily as needed.  3350 g  1  . Rivaroxaban (XARELTO) 20 MG TABS Take 1 tablet (20 mg total) by mouth daily.  30 tablet  11  . vitamin E 400 UNIT capsule Take 400 Units by mouth daily.         No current facility-administered medications on file prior to visit.     The PMH, PSH, Social History, Family History, Medications, and allergies have been reviewed in [facility], and have been updated if relevant.   Review of Systems       See HPI   Physical Exam BP 100/70  Pulse 76  Temp(Src) 98.3 F (36.8 C)  Wt 181 lb  (82.101 kg)  BMI 25.97 kg/m2  SpO2 96%  General:  GEN: nad, alert and oriented, constant wet cough. HEENT: mucous membranes moist,  +PND NECK: supple w/o LA CV: rrr.  no murmur PULM:  no inc wob but difficult for her to take deep inspirations without coughing, scattered wheezes ABD: soft, +bs EXT: no edema, mild right trapezius spasm, spurling negative, normal strength.   SKIN: no acute rash, alert & oriented X3, cranial nerves II-XII intact, strength normal in all extremities, sensation intact to light touch, and DTRs symmetrical and normal.   Psych:  Normal affect. MSK: No TTP over spine, SLR neg bilaterally, she TTP over left upper buttocks, very painful with movement.  Assessment and Plan:  1. Bronchitis- Given duration and progression of symptoms, will treat for bacterial process with 10 day course of ceftin. Sample of advair given. Call or return to clinic prn if these symptoms worsen or fail to improve as anticipated. The patient indicates understanding of these issues and agrees with the plan.

## 2013-03-03 ENCOUNTER — Encounter: Payer: Self-pay | Admitting: Family Medicine

## 2013-03-08 ENCOUNTER — Other Ambulatory Visit: Payer: Self-pay

## 2013-03-13 ENCOUNTER — Ambulatory Visit (INDEPENDENT_AMBULATORY_CARE_PROVIDER_SITE_OTHER): Payer: Medicare Other | Admitting: Internal Medicine

## 2013-03-13 ENCOUNTER — Encounter: Payer: Self-pay | Admitting: Internal Medicine

## 2013-03-13 VITALS — BP 112/72 | HR 63 | Ht 70.0 in | Wt 178.0 lb

## 2013-03-13 DIAGNOSIS — I4891 Unspecified atrial fibrillation: Secondary | ICD-10-CM

## 2013-03-13 MED ORDER — AMIODARONE HCL 200 MG PO TABS: 100.0000 mg | ORAL_TABLET | Freq: Every day | ORAL | Status: DC

## 2013-03-13 NOTE — Progress Notes (Signed)
PCP: Ruthe Mannan, MD  Monica Whitaker is a 67 y.o. female who presents today for routine electrophysiology followup.  Since her recent afib ablation, the patient reports doing very well. She is maintaining sinus rhythm.  Today, she denies symptoms of palpitations, chest pain, shortness of breath,  lower extremity edema, dizziness, presyncope, or syncope.  The patient is otherwise without complaint today.   Past Medical History  Diagnosis Date  . Anxiety   . Depression   . CVA (cerebral vascular accident) 12/2007  . Ventricular tachycardia     pt told at [facility] that she had RVOT VT  . Atrial fibrillation     paroxysmal, failed medical therapy with flecainide,  NSVT with tikosyn   Past Surgical History  Procedure Laterality Date  . Vaginal hysterectomy    . Tonsillectomy    . Eye surgery    . Tee without cardioversion N/A 10/18/2012    Procedure: TRANSESOPHAGEAL ECHOCARDIOGRAM (TEE);  Surgeon: Lewayne Bunting, MD;  Location: [facility] ENDOSCOPY;  Service: Cardiovascular;  Laterality: N/A;  Pre-Ablation at 12pm  . Atrial fibrillation ablation  10/18/12    PVI by Dr Johney Frame    Current Outpatient Prescriptions  Medication Sig Dispense Refill  . acetaminophen (TYLENOL) 500 MG tablet Take 2 tablets every 6 hours as needed      . amiodarone (PACERONE) 200 MG tablet Take 1 tablet (200 mg total) by mouth daily.  62 tablet  3  . calcium carbonate (OS-CAL) 600 MG TABS Take 600 mg by mouth 2 (two) times daily with a meal.        . cholecalciferol (VITAMIN D) 1000 UNITS tablet Take 1,000 Units by mouth daily.        Marland Kitchen dextroamphetamine (DEXTROSTAT) 5 MG tablet Take 5 mg by mouth daily.       Marland Kitchen diltiazem (CARDIZEM CD) 180 MG 24 hr capsule Take 1 capsule (180 mg total) by mouth as needed.  90 capsule  3  . docusate sodium (COLACE) 100 MG capsule Take 1 capsule (100 mg total) by mouth daily as needed for constipation.  30 capsule  0  . DULoxetine (CYMBALTA) 60 MG capsule Take 60 mg by mouth daily.      .  Fluticasone-Salmeterol (ADVAIR) 100-50 MCG/DOSE AEPB Inhale 1 puff into the lungs 2 (two) times daily.  1 each  3  . HYDROcodone-homatropine (HYCODAN) 5-1.5 MG/5ML syrup Take 5 mLs by mouth every 8 (eight) hours as needed for cough.  120 mL  0  . levothyroxine (SYNTHROID, LEVOTHROID) 25 MCG tablet TAKE 1 TABLET (25 MCG TOTAL) BY MOUTH DAILY.  90 tablet  1  . Multiple Vitamin (MULTIVITAMIN) tablet Take 1 tablet by mouth daily.        . Oral Electrolytes (BUFFERED SALT PO) Take by mouth.      . polyethylene glycol powder (GLYCOLAX/MIRALAX) powder Take 17 g by mouth daily as needed.  3350 g  1  . Rivaroxaban (XARELTO) 20 MG TABS Take 1 tablet (20 mg total) by mouth daily.  30 tablet  11  . vitamin E 400 UNIT capsule Take 400 Units by mouth daily.         No current facility-administered medications for this visit.    Physical Exam: Filed Vitals:   03/13/13 1047  BP: 112/72  Pulse: 63  Height: 5\' 10"  (1.778 m)  Weight: 178 lb (80.74 kg)    GEN- The patient is well appearing, alert and oriented x 3 today.   Head- normocephalic, atraumatic  Eyes-  Sclera clear, conjunctiva pink Ears- hearing intact Oropharynx- clear Lungs- Clear to ausculation bilaterally, normal work of breathing Heart- Regular rate and rhythm, no murmurs, rubs or gallops, PMI not laterally displaced GI- soft, NT, ND, + BS Extremities- no clubbing, cyanosis, or edema  ekg today reveals sinus rhythm 63 bpm, otherwise normal ekg  Assessment and Plan:  1. afib Doing well s/p ablation Decrease amiodarone to 100mg  daily  Continue xarelto   Return in 4 months

## 2013-03-13 NOTE — Patient Instructions (Signed)
Your physician recommends that you schedule a follow-up appointment in: 4 months with Dr Johney Frame  Your physician has recommended you make the following change in your medication:  1) Decrease Amiodarone to 100mg  daily

## 2013-04-04 ENCOUNTER — Other Ambulatory Visit: Payer: Self-pay | Admitting: Internal Medicine

## 2013-04-14 ENCOUNTER — Ambulatory Visit: Payer: Self-pay | Admitting: Family Medicine

## 2013-04-14 ENCOUNTER — Encounter: Payer: Self-pay | Admitting: Family Medicine

## 2013-05-04 ENCOUNTER — Encounter: Payer: Self-pay | Admitting: Family Medicine

## 2013-05-04 ENCOUNTER — Ambulatory Visit: Payer: Self-pay | Admitting: Family Medicine

## 2013-06-08 ENCOUNTER — Other Ambulatory Visit: Payer: Self-pay

## 2013-06-15 ENCOUNTER — Encounter: Payer: Self-pay | Admitting: Family Medicine

## 2013-06-15 ENCOUNTER — Ambulatory Visit: Payer: Medicare Other | Admitting: Family Medicine

## 2013-06-15 ENCOUNTER — Ambulatory Visit (INDEPENDENT_AMBULATORY_CARE_PROVIDER_SITE_OTHER): Payer: Medicare Other | Admitting: Family Medicine

## 2013-06-15 VITALS — BP 122/80 | HR 63 | Temp 97.3°F | Wt 181.0 lb

## 2013-06-15 DIAGNOSIS — R82998 Other abnormal findings in urine: Secondary | ICD-10-CM

## 2013-06-15 NOTE — Assessment & Plan Note (Addendum)
Main concern is the change in character of her urine, but unfortunately she was unable to provide sample today. We have provided her with a sterile cup to collect urine and return tomorrow morning for evaluation.  Further evaluation based on UA results.  Pt agrees with plan. Story not consistent with UTI .  ADDENDUM ==> pt dropped off urine today (06/16/2013) concerning for infection - UCx sent and will start her on cipro 7 d course (given prolonged sxs). Lab Results  Component Value Date   CREATININE 0.68 10/19/2012

## 2013-06-15 NOTE — Progress Notes (Signed)
Pre-visit discussion using our clinic review tool. No additional management support is needed unless otherwise documented below in the visit note.  

## 2013-06-15 NOTE — Patient Instructions (Signed)
Sign release of information for records of last colonoscopy (bring back tomorrow when you bring urine) Bring urine tomorrow and we will review for cause of these symptoms - may get some blood work depending on results of urine. In the meantime continue to drink plenty of water and let me know if symptoms persist or new symptoms arise.

## 2013-06-15 NOTE — Progress Notes (Signed)
  Subjective:    Patient ID: Monica Whitaker, female    DOB: 1945-11-08, 67 y.o.   MRN: [id number]  HPI CC: dark urine  1 mo h/o thicker, darker urine.  Different sweet smell.   Denies fevers/chills, dysuria, urgency or frequency.  No nausea/vomiting, back or flank pain.  Overall feeling well, just noted the urine character change recently. Feels she is drinking water and staying well hydrated.  Drinks some cranberry juice every morning. Had been eating more yogurt but not recently.  No other changes in diet.  Does take vitamins when she remembers.  Husband has been sick recently so she has not had time to come in for evaluation.  Past Medical History  Diagnosis Date  . Anxiety   . Depression   . CVA (cerebral vascular accident) 12/2007  . Ventricular tachycardia     pt told at [facility] that she had RVOT VT  . Atrial fibrillation     paroxysmal, failed medical therapy with flecainide,  NSVT with tikosyn     Review of Systems Per HPI    Objective:   Physical Exam  Nursing note and vitals reviewed. Constitutional: She appears well-developed and well-nourished. No distress.  Cardiovascular: Normal rate, regular rhythm, normal heart sounds and intact distal pulses.   No murmur heard. Pulmonary/Chest: Effort normal and breath sounds normal. No respiratory distress. She has no wheezes. She has no rales.  Abdominal: Soft. Normal appearance and bowel sounds are normal. She exhibits no distension and no mass. There is no hepatosplenomegaly. There is no tenderness. There is no rigidity, no rebound, no guarding, no CVA tenderness and negative Murphy's sign.  Musculoskeletal: She exhibits no edema.  Skin: Skin is warm and dry. No rash noted.  Psychiatric: She has a normal mood and affect.       Assessment & Plan:

## 2013-06-16 LAB — POCT URINALYSIS DIPSTICK
Bilirubin, UA: NEGATIVE
Glucose, UA: NEGATIVE
Ketones, UA: NEGATIVE
Spec Grav, UA: 1.01

## 2013-06-16 MED ORDER — CIPROFLOXACIN HCL 250 MG PO TABS: 250.0000 mg | ORAL_TABLET | Freq: Two times a day (BID) | ORAL | Status: DC

## 2013-06-16 NOTE — Addendum Note (Signed)
Addended by: Eustaquio Boyden on: 06/16/2013 10:43 AM   Modules accepted: Orders

## 2013-06-16 NOTE — Addendum Note (Signed)
Addended by: Josph Macho A on: 06/16/2013 09:23 AM   Modules accepted: Orders

## 2013-06-19 LAB — URINE CULTURE: Culture: >=100000

## 2013-06-26 ENCOUNTER — Other Ambulatory Visit: Payer: Self-pay | Admitting: Family Medicine

## 2013-06-26 NOTE — Telephone Encounter (Signed)
Spoke with [facility] Lewandowsky pt got 90 day refill on 06/14/13; does not need refill now.

## 2013-07-12 ENCOUNTER — Ambulatory Visit (INDEPENDENT_AMBULATORY_CARE_PROVIDER_SITE_OTHER): Payer: Medicare Other | Admitting: Internal Medicine

## 2013-07-12 ENCOUNTER — Encounter: Payer: Self-pay | Admitting: Internal Medicine

## 2013-07-12 VITALS — BP 110/70 | HR 61 | Ht 71.0 in | Wt 180.8 lb

## 2013-07-12 DIAGNOSIS — I4891 Unspecified atrial fibrillation: Secondary | ICD-10-CM

## 2013-07-12 NOTE — Patient Instructions (Signed)
Your physician wants you to follow-up in: 6 months with Dr Allred You will receive a reminder letter in the mail two months in advance. If you don't receive a letter, please call our office to schedule the follow-up appointment.    Your physician has recommended you make the following change in your medication:  1) Stop Amiodarone   

## 2013-07-12 NOTE — Progress Notes (Signed)
PCP: Ruthe Mannan, MD  Monica Whitaker is a 67 y.o. female who presents today for routine electrophysiology followup.  Since her last visit, the patient reports doing very well. She is maintaining sinus rhythm.  Today, she denies symptoms of palpitations, chest pain, shortness of breath,  lower extremity edema, dizziness, presyncope, or syncope.  The patient is otherwise without complaint today.   Past Medical History  Diagnosis Date  . Anxiety   . Depression   . CVA (cerebral vascular accident) 12/2007  . Ventricular tachycardia     pt told at [facility] that she had RVOT VT  . Atrial fibrillation     paroxysmal, failed medical therapy with flecainide,  NSVT with tikosyn   Past Surgical History  Procedure Laterality Date  . Vaginal hysterectomy    . Tonsillectomy    . Eye surgery    . Tee without cardioversion N/A 10/18/2012    Procedure: TRANSESOPHAGEAL ECHOCARDIOGRAM (TEE);  Surgeon: Lewayne Bunting, MD;  Location: [facility] ENDOSCOPY;  Service: Cardiovascular;  Laterality: N/A;  Pre-Ablation at 12pm  . Atrial fibrillation ablation  10/18/12    PVI by Dr Johney Frame    Current Outpatient Prescriptions  Medication Sig Dispense Refill  . acetaminophen (TYLENOL) 500 MG tablet Take 2 tablets every 6 hours as needed      . amiodarone (PACERONE) 200 MG tablet Take 0.5 tablets (100 mg total) by mouth daily.  62 tablet  3  . calcium carbonate (OS-CAL) 600 MG TABS Take 600 mg by mouth 2 (two) times daily with a meal.        . cholecalciferol (VITAMIN D) 1000 UNITS tablet Take 1,000 Units by mouth daily.        . ciprofloxacin (CIPRO) 250 MG tablet Take 1 tablet (250 mg total) by mouth 2 (two) times daily.  14 tablet  0  . dextroamphetamine (DEXTROSTAT) 5 MG tablet Take 5 mg by mouth daily.       Marland Kitchen diltiazem (CARDIZEM CD) 180 MG 24 hr capsule Take 1 capsule (180 mg total) by mouth as needed.  90 capsule  3  . docusate sodium (COLACE) 100 MG capsule Take 1 capsule (100 mg total) by mouth daily as needed for  constipation.  30 capsule  0  . DULoxetine (CYMBALTA) 60 MG capsule Take 60 mg by mouth daily.      Marland Kitchen levothyroxine (SYNTHROID, LEVOTHROID) 25 MCG tablet TAKE 1 TABLET (25 MCG TOTAL) BY MOUTH DAILY.  90 tablet  1  . Multiple Vitamin (MULTIVITAMIN) tablet Take 1 tablet by mouth daily.        . Oral Electrolytes (BUFFERED SALT PO) Take by mouth.      . polyethylene glycol powder (GLYCOLAX/MIRALAX) powder Take 17 g by mouth daily as needed.  3350 g  1  . Rivaroxaban (XARELTO) 20 MG TABS Take 1 tablet (20 mg total) by mouth daily.  30 tablet  11  . vitamin E 400 UNIT capsule Take 400 Units by mouth daily.         No current facility-administered medications for this visit.    Physical Exam: Filed Vitals:   07/12/13 0919  BP: 110/70  Pulse: 61  Height: 5\' 11"  (1.803 m)  Weight: 180 lb 12.8 oz (82.01 kg)    GEN- The patient is well appearing, alert and oriented x 3 today.   Head- normocephalic, atraumatic Eyes-  Sclera clear, conjunctiva pink Ears- hearing intact Oropharynx- clear Lungs- Clear to ausculation bilaterally, normal work of breathing Heart- Regular  rate and rhythm, no murmurs, rubs or gallops, PMI not laterally displaced GI- soft, NT, ND, + BS Extremities- no clubbing, cyanosis, or edema  ekg today reveals sinus rhythm 63 bpm, otherwise normal ekg  Assessment and Plan:  1. afib Doing well s/p ablation Stop amiodarone Continue xarelto   Return in 6 months

## 2013-07-28 ENCOUNTER — Encounter: Payer: Self-pay | Admitting: Podiatry

## 2013-07-31 ENCOUNTER — Ambulatory Visit (INDEPENDENT_AMBULATORY_CARE_PROVIDER_SITE_OTHER): Payer: Medicare Other | Admitting: Podiatry

## 2013-07-31 ENCOUNTER — Encounter: Payer: Self-pay | Admitting: Podiatry

## 2013-07-31 ENCOUNTER — Ambulatory Visit (INDEPENDENT_AMBULATORY_CARE_PROVIDER_SITE_OTHER): Payer: Medicare Other

## 2013-07-31 VITALS — BP 92/62 | HR 64 | Resp 16 | Ht 70.0 in | Wt 175.0 lb

## 2013-07-31 DIAGNOSIS — M79609 Pain in unspecified limb: Secondary | ICD-10-CM

## 2013-07-31 DIAGNOSIS — M79671 Pain in right foot: Secondary | ICD-10-CM

## 2013-07-31 DIAGNOSIS — M722 Plantar fascial fibromatosis: Secondary | ICD-10-CM

## 2013-07-31 NOTE — Progress Notes (Signed)
She presents today complaining of the right dorsal lateral aspect of her foot. She states that she's walking differently which is making her hips hurt bilaterally. She denies any trauma to the foot but does state that this is her stroke foot.  Objective: Vital signs are stable she is alert and oriented x3. Pulses are strongly palpable bilateral. Pain on palpation medial continued tubercle of the right foot with soft tissue increase in density at the plantar fascial calcaneal insertion site on radiographs. She also has tenderness overlying the fourth fifth metatarsal cuboid articulation right foot.  Assessment: Plantar fasciitis with lateral compensatory syndrome right.  Plan: Injected the right heel today and I will followup with her in one month.

## 2013-08-28 ENCOUNTER — Encounter: Payer: Self-pay | Admitting: Podiatry

## 2013-08-28 ENCOUNTER — Ambulatory Visit (INDEPENDENT_AMBULATORY_CARE_PROVIDER_SITE_OTHER): Payer: Medicare Other | Admitting: Podiatry

## 2013-08-28 VITALS — BP 98/63 | HR 69 | Resp 16

## 2013-08-28 DIAGNOSIS — M722 Plantar fascial fibromatosis: Secondary | ICD-10-CM

## 2013-08-28 NOTE — Progress Notes (Signed)
Right foot , pt states its all right, but it is hurting up the leg. She's going to the lateral aspect of her right foot and leg.  Objective: Vital signs are stable she is alert and oriented x3. Pulses are palpable bilateral. She has tenderness on palpation of the medial continued tubercle of the right heel. She has tenderness on palpation of the peroneal tendons.  Assessment: Lateral compensatory syndrome associated with plantar fasciitis right.  Plan: Discussed etiology pathology conservative versus surgical therapies we injected the right heel again today with Kenalog and local anesthetic this was her second injection.

## 2013-08-29 ENCOUNTER — Encounter: Payer: Self-pay | Admitting: Family Medicine

## 2013-08-29 ENCOUNTER — Ambulatory Visit (INDEPENDENT_AMBULATORY_CARE_PROVIDER_SITE_OTHER): Payer: Medicare Other | Admitting: Family Medicine

## 2013-08-29 VITALS — BP 106/68 | HR 66 | Temp 97.7°F | Ht 71.0 in | Wt 180.8 lb

## 2013-08-29 DIAGNOSIS — M722 Plantar fascial fibromatosis: Secondary | ICD-10-CM | POA: Insufficient documentation

## 2013-08-29 NOTE — Progress Notes (Signed)
Subjective:    Patient ID: Monica Whitaker, female    DOB: 1946/05/12, 68 y.o.   MRN: [id number]  HPI  Very pleasant female here to discuss her right heel/leg pain.  Saw Dr. Milinda Pointer yesterday, note reviewed.  Gave her injection in her right heel for plantar fascitis.  Since then her foot and leg pain has resolved.  She wants to make sure that she is doing all the right things- got new walking shoes, etc.  She also wants to discuss why she did not have classic plantar fascitis symptoms- ie pain radiated up her leg as well.  She has follow up with Dr. Milinda Pointer in 1 month.  Patient Active Problem List   Diagnosis Date Noted  . Plantar fasciitis, left 08/29/2013  . Dark urine 06/15/2013  . Bowel habit changes 01/02/2013  . Back pain 06/22/2012  . Abnormal body temperature 04/11/2012  . Alopecia 08/24/2011  . Need for hepatitis C screening test 08/24/2011  . Cough 04/09/2011  . Edema 01/01/2011  . Neck pain 12/08/2010  . FEVER UNSPECIFIED 06/16/2010  . MICROSCOPIC HEMATURIA 06/06/2010  . MYALGIA 05/16/2010  . HYPOTHYROIDISM 04/10/2010  . ANXIETY DEPRESSION 04/10/2010  . CEREBRAL EMBOLISM WITHOUT MENTION INFARCT 04/10/2010  . Atrial fibrillation 03/11/2010  . DIZZINESS 03/11/2010  . FATIGUE / MALAISE 03/11/2010  . PALPITATIONS 03/11/2010   Past Medical History  Diagnosis Date  . Anxiety   . Depression   . CVA (cerebral vascular accident) 12/2007  . Ventricular tachycardia     pt told at [facility] that she had RVOT VT  . Atrial fibrillation     paroxysmal, failed medical therapy with flecainide,  NSVT with tikosyn  . Thyroid disease   . Stroke   . Bruises easily    Past Surgical History  Procedure Laterality Date  . Vaginal hysterectomy    . Tonsillectomy    . Eye surgery    . Tee without cardioversion N/A 10/18/2012    Procedure: TRANSESOPHAGEAL ECHOCARDIOGRAM (TEE);  Surgeon: Lelon Perla, MD;  Location: [facility] ENDOSCOPY;  Service: Cardiovascular;  Laterality: N/A;   Pre-Ablation at 12pm  . Atrial fibrillation ablation  10/18/12    PVI by Dr Rayann Heman   History  Substance Use Topics  . Smoking status: Never Smoker   . Smokeless tobacco: Never Used  . Alcohol Use: 0.5 oz/week    1 drink(s) per week     Comment: regular   Family History  Problem Relation Age of Onset  . Multiple sclerosis Father    Allergies  Allergen Reactions  . Darvon   . Levofloxacin Other (See Comments)    Causes panic attacks.  . Propoxyphene N-Acetaminophen    Current Outpatient Prescriptions on File Prior to Visit  Medication Sig Dispense Refill  . acetaminophen (TYLENOL) 500 MG tablet Take 2 tablets every 6 hours as needed      . calcium carbonate (OS-CAL) 600 MG TABS Take 600 mg by mouth 2 (two) times daily with a meal.        . cholecalciferol (VITAMIN D) 1000 UNITS tablet Take 1,000 Units by mouth daily.        Marland Kitchen dextroamphetamine (DEXTROSTAT) 5 MG tablet Take 5 mg by mouth daily.       Marland Kitchen diltiazem (CARDIZEM CD) 180 MG 24 hr capsule Take 1 capsule (180 mg total) by mouth as needed.  90 capsule  3  . docusate sodium (COLACE) 100 MG capsule Take 1 capsule (100 mg total) by mouth daily as  needed for constipation.  30 capsule  0  . DULoxetine (CYMBALTA) 60 MG capsule Take 60 mg by mouth daily.      Marland Kitchen levothyroxine (SYNTHROID, LEVOTHROID) 25 MCG tablet TAKE 1 TABLET (25 MCG TOTAL) BY MOUTH DAILY.  90 tablet  1  . Multiple Vitamin (MULTIVITAMIN) tablet Take 1 tablet by mouth daily.        . Oral Electrolytes (BUFFERED SALT PO) Take by mouth.      . polyethylene glycol powder (GLYCOLAX/MIRALAX) powder Take 17 g by mouth daily as needed.  3350 g  1  . Rivaroxaban (XARELTO) 20 MG TABS Take 1 tablet (20 mg total) by mouth daily.  30 tablet  11  . Timolol Maleate (TIMOPTIC OP) Apply to eye daily.      . vitamin E 400 UNIT capsule Take 400 Units by mouth daily.         No current facility-administered medications on file prior to visit.   The PMH, PSH, Social History, Family  History, Medications, and allergies have been reviewed in [facility], and have been updated if relevant.  Review of Systems    See HPI No LE weakness No back pain Objective:   Physical Exam BP 106/68  Pulse 66  Temp(Src) 97.7 F (36.5 C) (Oral)  Ht 5\' 11"  (1.803 m)  Wt 180 lb 12 oz (81.988 kg)  BMI 25.22 kg/m2  SpO2 97% Gen: alert, pleasant, NAD Psych:  Good eye contact, not anxious or depressed appearing     Assessment & Plan:

## 2013-08-29 NOTE — Progress Notes (Signed)
Pre-visit discussion using our clinic review tool. No additional management support is needed unless otherwise documented below in the visit note.  

## 2013-08-29 NOTE — Assessment & Plan Note (Signed)
>  15 min spent with face to face with patient, >50% counseling and/or coordinating care. Reassurance provided- likely was compensating in her gait for her heel pain and which was causing her leg pain which has since resolved. She will follow up with Dr. Milinda Pointer in one month and I will see her for her wellness visit next month, sooner if her symptoms deteriorate. The patient indicates understanding of these issues and agrees with the plan.

## 2013-09-08 ENCOUNTER — Telehealth: Payer: Self-pay

## 2013-09-08 NOTE — Telephone Encounter (Signed)
Patient called about her PA being done for her xarelto, her husband was done but hers was not and she turned the sheet in for both at the same time. I called and got the PA done and called the patient to let her know that it was approved.  [place] #[id number] ID #[id number]

## 2013-09-10 ENCOUNTER — Other Ambulatory Visit: Payer: Self-pay | Admitting: Family Medicine

## 2013-09-18 ENCOUNTER — Other Ambulatory Visit (INDEPENDENT_AMBULATORY_CARE_PROVIDER_SITE_OTHER): Payer: Medicare Other

## 2013-09-18 ENCOUNTER — Other Ambulatory Visit: Payer: Self-pay | Admitting: Family Medicine

## 2013-09-18 DIAGNOSIS — Z0000 Encounter for general adult medical examination without abnormal findings: Secondary | ICD-10-CM

## 2013-09-18 DIAGNOSIS — R5383 Other fatigue: Secondary | ICD-10-CM

## 2013-09-18 DIAGNOSIS — Z136 Encounter for screening for cardiovascular disorders: Secondary | ICD-10-CM

## 2013-09-18 DIAGNOSIS — R5381 Other malaise: Secondary | ICD-10-CM

## 2013-09-18 DIAGNOSIS — E039 Hypothyroidism, unspecified: Secondary | ICD-10-CM

## 2013-09-18 LAB — TSH: TSH: 1.5 u[IU]/mL (ref 0.35–5.50)

## 2013-09-18 LAB — COMPREHENSIVE METABOLIC PANEL
ALK PHOS: 52 U/L (ref 39–117)
ALT: 15 U/L (ref 0–35)
AST: 19 U/L (ref 0–37)
Albumin: 3.9 g/dL (ref 3.5–5.2)
BUN: 19 mg/dL (ref 6–23)
CO2: 29 mEq/L (ref 19–32)
Calcium: 8.9 mg/dL (ref 8.4–10.5)
Chloride: 101 mEq/L (ref 96–112)
Creatinine, Ser: 0.7 mg/dL (ref 0.4–1.2)
GFR: 87.5 mL/min (ref >60.00)
GLUCOSE: 77 mg/dL (ref 70–99)
POTASSIUM: 4 meq/L (ref 3.5–5.1)
SODIUM: 138 meq/L (ref 135–145)
TOTAL PROTEIN: 7 g/dL (ref 6.0–8.3)
Total Bilirubin: 0.8 mg/dL (ref 0.3–1.2)

## 2013-09-18 LAB — LIPID PANEL
CHOL/HDL RATIO: 3
CHOLESTEROL: 233 mg/dL — AB (ref 0–200)
HDL: 92.2 mg/dL (ref >39.00)
TRIGLYCERIDES: 74 mg/dL (ref 0.0–149.0)
VLDL: 14.8 mg/dL (ref 0.0–40.0)

## 2013-09-18 LAB — T4, FREE: Free T4: 0.93 ng/dL (ref 0.60–1.60)

## 2013-09-18 LAB — LDL CHOLESTEROL, DIRECT: Direct LDL: 123.5 mg/dL

## 2013-09-19 ENCOUNTER — Encounter: Payer: Medicare Other | Admitting: Family Medicine

## 2013-09-25 ENCOUNTER — Ambulatory Visit: Payer: Medicare Other | Admitting: Podiatry

## 2013-10-02 ENCOUNTER — Encounter: Payer: Self-pay | Admitting: Family Medicine

## 2013-10-02 ENCOUNTER — Ambulatory Visit (INDEPENDENT_AMBULATORY_CARE_PROVIDER_SITE_OTHER): Payer: Medicare Other | Admitting: Family Medicine

## 2013-10-02 VITALS — BP 120/68 | HR 65 | Temp 97.2°F | Ht 69.0 in | Wt 184.0 lb

## 2013-10-02 DIAGNOSIS — Z0000 Encounter for general adult medical examination without abnormal findings: Secondary | ICD-10-CM

## 2013-10-02 DIAGNOSIS — Z23 Encounter for immunization: Secondary | ICD-10-CM

## 2013-10-02 DIAGNOSIS — Z1211 Encounter for screening for malignant neoplasm of colon: Secondary | ICD-10-CM

## 2013-10-02 DIAGNOSIS — F341 Dysthymic disorder: Secondary | ICD-10-CM

## 2013-10-02 DIAGNOSIS — E039 Hypothyroidism, unspecified: Secondary | ICD-10-CM

## 2013-10-02 DIAGNOSIS — Z780 Asymptomatic menopausal state: Secondary | ICD-10-CM

## 2013-10-02 DIAGNOSIS — I4891 Unspecified atrial fibrillation: Secondary | ICD-10-CM

## 2013-10-02 NOTE — Assessment & Plan Note (Signed)
Rate and rhythm controlled. On xarelto, prn dilt

## 2013-10-02 NOTE — Addendum Note (Signed)
Addended by: Carter Kitten on: 10/02/2013 04:18 PM   Modules accepted: Orders

## 2013-10-02 NOTE — Assessment & Plan Note (Signed)
Well controlled. Followed by psychiatry.

## 2013-10-02 NOTE — Assessment & Plan Note (Signed)
On synthroid. TSH stable.  No changes.

## 2013-10-02 NOTE — Patient Instructions (Addendum)
Great to see you. We will call you with your bone density appointment.  Please stop by the lab to get your stool cards.  You should watch [facility] to [facility] in [place].  Burnett Sheng at [facility]- admissions coordinator.

## 2013-10-02 NOTE — Assessment & Plan Note (Signed)
The patients weight, height, BMI and visual acuity have been recorded in the chart I have made referrals, counseling and provided education to the patient based review of the above and I have provided the pt with a written personalized care plan for preventive services.  

## 2013-10-02 NOTE — Progress Notes (Signed)
Subjective:   Patient ID: Monica Whitaker, female    DOB: 17-May-1946, 68 y.o.   MRN: [id number]  Monica Whitaker is a pleasant 68 y.o. year old female who presents to clinic today with Annual Exam  on 10/02/2013  HPI: I have personally reviewed the Medicare Annual Wellness questionnaire and have noted 1. The patient's medical and social history 2. Their use of alcohol, tobacco or illicit drugs 3. Their current medications and supplements 4. The patient's functional ability including ADL's, fall risks, home safety risks and hearing or visual             impairment. 5. Diet and physical activities 6. Evidence for depression or mood disorders End of life wishes discussed and updated in Social History.  Mammogram 05/2013 Colonoscopy- 2010 Due for bone density UTD all immunizations except prevnar/pneumovax.  Afib- followed by Dr. Rockey Situ.  Goes into afib every few months.  Takes prn diltizem.  On Xarelto and is much happier with it than coumadin.  CBC    Component Value Date/Time   WBC 4.3* 10/11/2012 1132   RBC 4.49 10/11/2012 1132   HGB 13.7 10/11/2012 1132   HCT 41.1 10/11/2012 1132   PLT 268.0 10/11/2012 1132   MCV 91.5 10/11/2012 1132   MCH 30.3 04/13/2011 0500   MCHC 33.3 10/11/2012 1132   RDW 14.1 10/11/2012 1132   LYMPHSABS 1.7 10/11/2012 1132   MONOABS 0.5 10/11/2012 1132   EOSABS 0.4 10/11/2012 1132   BASOSABS 0.0 10/11/2012 1132    Lab Results  Component Value Date   CHOL 233* 09/18/2013   HDL 92.20 09/18/2013   LDLCALC 114 03/28/2010   LDLDIRECT 123.5 09/18/2013   TRIG 74.0 09/18/2013   CHOLHDL 3 09/18/2013     Patient Active Problem List   Diagnosis Date Noted  . Welcome to Medicare preventive visit 10/02/2013  . Bowel habit changes 01/02/2013  . Back pain 06/22/2012  . Alopecia 08/24/2011  . MICROSCOPIC HEMATURIA 06/06/2010  . HYPOTHYROIDISM 04/10/2010  . ANXIETY DEPRESSION 04/10/2010  . CEREBRAL EMBOLISM WITHOUT MENTION INFARCT 04/10/2010  . Atrial fibrillation  03/11/2010  . FATIGUE / MALAISE 03/11/2010   Past Medical History  Diagnosis Date  . Anxiety   . Depression   . CVA (cerebral vascular accident) 12/2007  . Ventricular tachycardia     pt told at [facility] that she had RVOT VT  . Atrial fibrillation     paroxysmal, failed medical therapy with flecainide,  NSVT with tikosyn  . Thyroid disease   . Stroke   . Bruises easily    Past Surgical History  Procedure Laterality Date  . Vaginal hysterectomy    . Tonsillectomy    . Eye surgery    . Tee without cardioversion N/A 10/18/2012    Procedure: TRANSESOPHAGEAL ECHOCARDIOGRAM (TEE);  Surgeon: Lelon Perla, MD;  Location: [facility] ENDOSCOPY;  Service: Cardiovascular;  Laterality: N/A;  Pre-Ablation at 12pm  . Atrial fibrillation ablation  10/18/12    PVI by Dr Rayann Heman   History  Substance Use Topics  . Smoking status: Never Smoker   . Smokeless tobacco: Never Used  . Alcohol Use: 0.5 oz/week    1 drink(s) per week     Comment: regular   Family History  Problem Relation Age of Onset  . Multiple sclerosis Father    Allergies  Allergen Reactions  . Darvon   . Levofloxacin Other (See Comments)    Causes panic attacks.  . Propoxyphene N-Acetaminophen    Current Outpatient  Prescriptions on File Prior to Visit  Medication Sig Dispense Refill  . acetaminophen (TYLENOL) 500 MG tablet Take 2 tablets every 6 hours as needed      . calcium carbonate (OS-CAL) 600 MG TABS Take 600 mg by mouth 2 (two) times daily with a meal.        . cholecalciferol (VITAMIN D) 1000 UNITS tablet Take 1,000 Units by mouth daily.        Marland Kitchen dextroamphetamine (DEXTROSTAT) 5 MG tablet Take 5 mg by mouth daily.       Marland Kitchen diltiazem (CARDIZEM CD) 180 MG 24 hr capsule Take 1 capsule (180 mg total) by mouth as needed.  90 capsule  3  . docusate sodium (COLACE) 100 MG capsule Take 1 capsule (100 mg total) by mouth daily as needed for constipation.  30 capsule  0  . DULoxetine (CYMBALTA) 60 MG capsule Take 60 mg by mouth  daily.      Marland Kitchen levothyroxine (SYNTHROID, LEVOTHROID) 25 MCG tablet TAKE 1 TABLET DAILY.  30 tablet  0  . Multiple Vitamin (MULTIVITAMIN) tablet Take 1 tablet by mouth daily.        . Oral Electrolytes (BUFFERED SALT PO) Take by mouth.      . polyethylene glycol powder (GLYCOLAX/MIRALAX) powder Take 17 g by mouth daily as needed.  3350 g  1  . Rivaroxaban (XARELTO) 20 MG TABS Take 1 tablet (20 mg total) by mouth daily.  30 tablet  11  . Timolol Maleate (TIMOPTIC OP) Apply to eye daily.      . vitamin E 400 UNIT capsule Take 400 Units by mouth daily.         No current facility-administered medications on file prior to visit.   The PMH, PSH, Social History, Family History, Medications, and allergies have been reviewed in [facility], and have been updated if relevant.. Review of Systems See HPI Patient reports no  vision/ hearing changes,anorexia, weight change, fever ,adenopathy, persistant / recurrent hoarseness, swallowing issues, chest pain, edema,persistant / recurrent cough, hemoptysis, dyspnea(rest, exertional, paroxysmal nocturnal), gastrointestinal  bleeding (melena, rectal bleeding), abdominal pain, excessive heart burn, GU symptoms(dysuria, hematuria, pyuria, voiding/incontinence  Issues) syncope, focal weakness, severe memory loss, concerning skin lesions, depression, anxiety, abnormal bruising/bleeding, major joint swelling, breast masses or abnormal vaginal bleeding.       Objective:    BP 120/68  Pulse 65  Temp(Src) 97.2 F (36.2 C) (Oral)  Ht 5\' 9"  (1.753 m)  Wt 184 lb (83.462 kg)  BMI 27.16 kg/m2  SpO2 96%   Physical Exam    General:  Well-developed,well-nourished,in no acute distress; alert,appropriate and cooperative throughout examination Head:  normocephalic and atraumatic.   Eyes:  vision grossly intact, pupils equal, pupils round, and pupils reactive to light.   Ears:  R ear normal and L ear normal.   Nose:  no external deformity.   Mouth:  good dentition.   Neck:   No deformities, masses, or tenderness noted. Lungs:  Normal respiratory effort, chest expands symmetrically. Lungs are clear to auscultation, no crackles or wheezes. Heart:  Normal rate and regular rhythm. S1 and S2 normal without gallop, murmur, click, rub or other extra sounds. Abdomen:  Bowel sounds positive,abdomen soft and non-tender without masses, organomegaly or hernias noted. Msk:  No deformity or scoliosis noted of thoracic or lumbar spine.   Extremities:  No clubbing, cyanosis, edema, or deformity noted with normal full range of motion of all joints.   Neurologic:  alert & oriented X3 and  gait normal.   Skin:  Intact without suspicious lesions or rashes Cervical Nodes:  No lymphadenopathy noted Axillary Nodes:  No palpable lymphadenopathy Psych:  Cognition and judgment appear intact. Alert and cooperative with normal attention span and concentration. No apparent delusions, illusions, hallucinations      Assessment & Plan:   Welcome to Medicare preventive visit  HYPOTHYROIDISM  Atrial fibrillation  ANXIETY DEPRESSION  Post-menopausal - Plan: DG Bone Density No Follow-up on file.

## 2013-10-02 NOTE — Progress Notes (Signed)
Pre visit review using our clinic review tool, if applicable. No additional management support is needed unless otherwise documented below in the visit note. 

## 2013-10-06 ENCOUNTER — Other Ambulatory Visit (INDEPENDENT_AMBULATORY_CARE_PROVIDER_SITE_OTHER): Payer: Medicare Other

## 2013-10-06 DIAGNOSIS — Z1211 Encounter for screening for malignant neoplasm of colon: Secondary | ICD-10-CM

## 2013-10-06 LAB — FECAL OCCULT BLOOD, IMMUNOCHEMICAL: Fecal Occult Bld: POSITIVE — AB

## 2013-10-10 ENCOUNTER — Other Ambulatory Visit: Payer: Self-pay | Admitting: Family Medicine

## 2013-10-23 ENCOUNTER — Other Ambulatory Visit (INDEPENDENT_AMBULATORY_CARE_PROVIDER_SITE_OTHER): Payer: Medicare Other

## 2013-10-23 ENCOUNTER — Other Ambulatory Visit: Payer: Self-pay | Admitting: Family Medicine

## 2013-10-23 DIAGNOSIS — K921 Melena: Secondary | ICD-10-CM

## 2013-10-23 LAB — FECAL OCCULT BLOOD, IMMUNOCHEMICAL: Fecal Occult Bld: POSITIVE — AB

## 2013-10-25 ENCOUNTER — Ambulatory Visit: Payer: Self-pay | Admitting: Family Medicine

## 2013-10-26 ENCOUNTER — Other Ambulatory Visit: Payer: Self-pay | Admitting: Internal Medicine

## 2013-10-27 ENCOUNTER — Other Ambulatory Visit: Payer: Self-pay | Admitting: Family Medicine

## 2013-10-27 DIAGNOSIS — R195 Other fecal abnormalities: Secondary | ICD-10-CM

## 2013-11-02 ENCOUNTER — Encounter: Payer: Self-pay | Admitting: Internal Medicine

## 2013-11-07 ENCOUNTER — Encounter: Payer: Self-pay | Admitting: Family Medicine

## 2013-11-07 NOTE — Telephone Encounter (Signed)
I reviewed her chart, but was unable to find results for her bone density. I do see that it was ordered, but I dont show that it was completed

## 2013-11-30 ENCOUNTER — Encounter: Payer: Self-pay | Admitting: Family Medicine

## 2013-11-30 ENCOUNTER — Telehealth: Payer: Self-pay

## 2013-11-30 NOTE — Telephone Encounter (Signed)
Pt request bone density results; spoke with Monica Whitaker at [facility] and she will fax bone density results to office. Pt wanted Dr Deborra Medina to know that GI office called this AM and rescheduled her appt with GI Dr Ardis Hughs for the end of June. Pt wants to know if Dr Deborra Medina thinks OK to wait that long to see GI doctor. Pt request cb.

## 2013-11-30 NOTE — Telephone Encounter (Signed)
Lm on pts vm requesting a call back 

## 2013-11-30 NOTE — Telephone Encounter (Signed)
Yes it is probably ok to wait but of course, it is impossible for me to know for sure.  Can she ask to see another provider or to be placed on a wait list?

## 2013-12-01 NOTE — Telephone Encounter (Signed)
Spoke to pt and advised per Dr Deborra Medina. Pt verbally expressed understanding and states that she will contact GI directly

## 2013-12-04 ENCOUNTER — Encounter: Payer: Self-pay | Admitting: Family Medicine

## 2013-12-22 ENCOUNTER — Ambulatory Visit: Payer: Medicare Other | Admitting: Internal Medicine

## 2013-12-27 ENCOUNTER — Ambulatory Visit: Payer: Medicare Other | Admitting: Internal Medicine

## 2013-12-29 ENCOUNTER — Telehealth: Payer: Self-pay | Admitting: Internal Medicine

## 2013-12-29 NOTE — Telephone Encounter (Signed)
New message     Pt was in the office a couple of days ago and gave the nurse a note to give to Dr Rayann Heman.  She has not had a phone call regarding the note and wanted to make sure he got it.  Please call

## 2013-12-29 NOTE — Telephone Encounter (Signed)
Called patient back, did not get an answer.  I have not received a note

## 2014-01-01 ENCOUNTER — Encounter: Payer: Self-pay | Admitting: Internal Medicine

## 2014-01-04 NOTE — Telephone Encounter (Signed)
Spoke with patient and let her know we need that test to make a decision about the blood thinner.  She does not see blood in her stool, but had 2 positive hemoccults.  She is going to proceed with colonoscopy

## 2014-01-18 ENCOUNTER — Encounter: Payer: Self-pay | Admitting: Family Medicine

## 2014-01-22 ENCOUNTER — Encounter: Payer: Self-pay | Admitting: Family Medicine

## 2014-01-23 ENCOUNTER — Ambulatory Visit: Payer: Medicare Other | Admitting: Gastroenterology

## 2014-01-25 ENCOUNTER — Telehealth: Payer: Self-pay | Admitting: Internal Medicine

## 2014-01-25 NOTE — Telephone Encounter (Signed)
Received request from Nurse fax box, documents faxed for surgical clearance. To: [facility] Fax number: [phone] Attention: [phone]

## 2014-02-05 ENCOUNTER — Other Ambulatory Visit: Payer: Self-pay | Admitting: Family Medicine

## 2014-02-13 ENCOUNTER — Ambulatory Visit (INDEPENDENT_AMBULATORY_CARE_PROVIDER_SITE_OTHER): Payer: Medicare Other | Admitting: Family Medicine

## 2014-02-13 ENCOUNTER — Encounter: Payer: Self-pay | Admitting: Family Medicine

## 2014-02-13 VITALS — BP 126/82 | HR 65 | Temp 98.3°F | Wt 177.8 lb

## 2014-02-13 DIAGNOSIS — R509 Fever, unspecified: Secondary | ICD-10-CM

## 2014-02-13 LAB — POCT URINALYSIS DIPSTICK
Bilirubin, UA: NEGATIVE
Glucose, UA: NEGATIVE
Ketones, UA: NEGATIVE
LEUKOCYTES UA: NEGATIVE
Nitrite, UA: 0.2
PH UA: 7.5
PROTEIN UA: NEGATIVE
RBC UA: NEGATIVE
Spec Grav, UA: <=1.005
Urobilinogen, UA: NEGATIVE

## 2014-02-13 NOTE — Progress Notes (Signed)
Pre visit review using our clinic review tool, if applicable. No additional management support is needed unless otherwise documented below in the visit note. 

## 2014-02-13 NOTE — Assessment & Plan Note (Signed)
Reassurance provided. Afebrile here as well. UA neg. Exam benign and reassuring. Call or return to clinic prn if these symptoms worsen or fail to improve as anticipated. The patient indicates understanding of these issues and agrees with the plan.

## 2014-02-13 NOTE — Progress Notes (Signed)
Subjective:   Patient ID: Monica Whitaker, female    DOB: 01/30/1946, 68 y.o.   MRN: [id number]  Monica Whitaker is a pleasant 68 y.o. year old female who presents to clinic today with Fever  on 02/13/2014  HPI: ?low grade fever- past few days, achy. No other symptoms- no cough, no runny nose. No dysuria or back pain.  She is going to her grand daughter's wedding in [place] next week and wants to make sure she's not getting sick.  Tmax 98.5 which she says is a fever for her.  Current Outpatient Prescriptions on File Prior to Visit  Medication Sig Dispense Refill  . acetaminophen (TYLENOL) 500 MG tablet Take 2 tablets every 6 hours as needed      . calcium carbonate (OS-CAL) 600 MG TABS Take 600 mg by mouth 2 (two) times daily with a meal.        . cholecalciferol (VITAMIN D) 1000 UNITS tablet Take 1,000 Units by mouth daily.        Marland Kitchen dextroamphetamine (DEXTROSTAT) 5 MG tablet Take 5 mg by mouth daily.       Marland Kitchen diltiazem (CARDIZEM CD) 180 MG 24 hr capsule Take 1 capsule (180 mg total) by mouth as needed.  90 capsule  3  . docusate sodium (COLACE) 100 MG capsule Take 1 capsule (100 mg total) by mouth daily as needed for constipation.  30 capsule  0  . DULoxetine (CYMBALTA) 60 MG capsule Take 60 mg by mouth daily.      Marland Kitchen levothyroxine (SYNTHROID, LEVOTHROID) 25 MCG tablet TAKE 1 TABLET EVERY DAY  90 tablet  0  . Multiple Vitamin (MULTIVITAMIN) tablet Take 1 tablet by mouth daily.        . Oral Electrolytes (BUFFERED SALT PO) Take by mouth.      . polyethylene glycol powder (GLYCOLAX/MIRALAX) powder Take 17 g by mouth daily as needed.  3350 g  1  . Timolol Maleate (TIMOPTIC OP) Apply to eye daily.      . vitamin E 400 UNIT capsule Take 400 Units by mouth daily.        Alveda Reasons 20 MG TABS tablet TAKE 1 TABLET BY MOUTH EVERY DAY  90 tablet  1   No current facility-administered medications on file prior to visit.    Allergies  Allergen Reactions  . Darvon   . Levofloxacin Other  (See Comments)    Causes panic attacks.  . Propoxyphene N-Acetaminophen     Past Medical History  Diagnosis Date  . Anxiety   . Depression   . CVA (cerebral vascular accident) 12/2007  . Ventricular tachycardia     pt told at [facility] that she had RVOT VT  . Atrial fibrillation     paroxysmal, failed medical therapy with flecainide,  NSVT with tikosyn  . Thyroid disease   . Stroke   . Bruises easily     Past Surgical History  Procedure Laterality Date  . Vaginal hysterectomy    . Tonsillectomy    . Eye surgery    . Tee without cardioversion N/A 10/18/2012    Procedure: TRANSESOPHAGEAL ECHOCARDIOGRAM (TEE);  Surgeon: Lelon Perla, MD;  Location: [facility] ENDOSCOPY;  Service: Cardiovascular;  Laterality: N/A;  Pre-Ablation at 12pm  . Atrial fibrillation ablation  10/18/12    PVI by Dr Rayann Heman    Family History  Problem Relation Age of Onset  . Multiple sclerosis Father     History   Social History  .  Marital Status: Married    Spouse Name: N/A    Number of Children: 0  . Years of Education: N/A   Occupational History  . Retired    Social History Main Topics  . Smoking status: Never Smoker   . Smokeless tobacco: Never Used  . Alcohol Use: 0.5 oz/week    1 drink(s) per week     Comment: regular  . Drug Use: No  . Sexual Activity: Not on file   Other Topics Concern  . Not on file   Social History Narrative   Lives in [place] with her husband.  No children.  Former Psychologist, prison and probation services      Has a living will-    Would desire CPR   Would not want prolonged life support if futile.   The PMH, PSH, Social History, Family History, Medications, and allergies have been reviewed in [facility], and have been updated if relevant.   Review of Systems See HPI No nausea +body aches No headache No SOB    Objective:    BP 126/82  Pulse 65  Temp(Src) 98.3 F (36.8 C) (Oral)  Wt 177 lb 12 oz (80.627 kg)  SpO2 97%   Physical Exam  Nursing note and vitals  reviewed. Constitutional: She appears well-developed and well-nourished. No distress.  HENT:  Head: Normocephalic.  Right Ear: External ear normal.  Left Ear: External ear normal.  Mouth/Throat: Oropharynx is clear and moist. No oropharyngeal exudate.  Cardiovascular: Normal rate and regular rhythm.   Pulmonary/Chest: Breath sounds normal. No respiratory distress. She has no wheezes. She has no rales. She exhibits no tenderness.  Abdominal: Soft. Bowel sounds are normal. There is no tenderness.  Skin: Skin is warm and dry. No rash noted. No erythema.  Psychiatric: She has a normal mood and affect. Her behavior is normal. Judgment and thought content normal.          Assessment & Plan:   Low grade fever - Plan: Urinalysis Dipstick No Follow-up on file.

## 2014-02-14 ENCOUNTER — Ambulatory Visit: Payer: Medicare Other | Admitting: Family Medicine

## 2014-03-06 ENCOUNTER — Ambulatory Visit: Payer: Self-pay | Admitting: Gastroenterology

## 2014-03-06 LAB — HM COLONOSCOPY

## 2014-03-07 ENCOUNTER — Encounter: Payer: Self-pay | Admitting: Family Medicine

## 2014-03-07 ENCOUNTER — Telehealth: Payer: Self-pay | Admitting: Internal Medicine

## 2014-03-07 NOTE — Telephone Encounter (Signed)
Spoke with patient and let her know that it is not unusual for this to happen.  She has taken the Diltiazem and is re-hydrating herself.  Her HR is now in the 70's and she feels it is trying to go back into rhythm.  She will call me back tomorrow if still in afib

## 2014-03-07 NOTE — Telephone Encounter (Signed)
°  Patient had colonoscopy yesterday and is now in AFIB, please call and advise.

## 2014-03-08 NOTE — Telephone Encounter (Signed)
Called patient again today to check on her.  I have left her a message to call me back if needed and hope she is better

## 2014-03-16 ENCOUNTER — Encounter: Payer: Self-pay | Admitting: Family Medicine

## 2014-03-30 ENCOUNTER — Telehealth: Payer: Self-pay | Admitting: Internal Medicine

## 2014-03-30 NOTE — Telephone Encounter (Signed)
Pt states she did not need to speak to a nurse.  She was calling to schedule her follow up appt with Dr Rayann Heman, looks like she was due in June 2015. She feels she is in and out of afib more often recently but does not feel she is afib right now.  She is aware I am forwarding to Dr Jackalyn Lombard scheduler, Lenna Sciara,  to help with scheduling a follow up appt with Dr Rayann Heman.

## 2014-03-30 NOTE — Telephone Encounter (Signed)
New problem   Pt stated she is in afib and want to speak to nurse. Please call pt.

## 2014-04-02 NOTE — Telephone Encounter (Signed)
Follow up  ° ° ° °Returning call back to nurse  °

## 2014-04-02 NOTE — Telephone Encounter (Signed)
Will have Melissa call and schedule patient

## 2014-04-16 ENCOUNTER — Ambulatory Visit: Payer: Medicare Other | Admitting: Internal Medicine

## 2014-04-25 ENCOUNTER — Encounter: Payer: Self-pay | Admitting: Internal Medicine

## 2014-04-25 ENCOUNTER — Ambulatory Visit (INDEPENDENT_AMBULATORY_CARE_PROVIDER_SITE_OTHER): Payer: Medicare Other | Admitting: Internal Medicine

## 2014-04-25 VITALS — BP 108/68 | HR 65 | Ht 70.0 in | Wt 180.6 lb

## 2014-04-25 DIAGNOSIS — R5383 Other fatigue: Secondary | ICD-10-CM

## 2014-04-25 DIAGNOSIS — I4891 Unspecified atrial fibrillation: Secondary | ICD-10-CM

## 2014-04-25 DIAGNOSIS — R5381 Other malaise: Secondary | ICD-10-CM

## 2014-04-25 DIAGNOSIS — M542 Cervicalgia: Secondary | ICD-10-CM

## 2014-04-25 MED ORDER — DILTIAZEM HCL ER COATED BEADS 180 MG PO CP24: 180.0000 mg | ORAL_CAPSULE | Freq: Every day | ORAL | Status: DC

## 2014-04-25 MED ORDER — FLECAINIDE ACETATE 50 MG PO TABS: 50.0000 mg | ORAL_TABLET | Freq: Two times a day (BID) | ORAL | Status: DC

## 2014-04-25 NOTE — Progress Notes (Signed)
PCP: Arnette Norris, MD  Monica Whitaker is a 68 y.o. female who presents today for routine electrophysiology followup.  H/o afib ablation 3/8//14. She had done well maintaining NSR until the last two weeks. She was on vacation and woke up in afib with RVR. She took 2 diltiazems a couple hours apart but failed to return to [place]. Since she was uncomfortable she went to the ER and spontaneously converted. She was instructed to go on Diltiazem daily. She had maintained NSR until this am when she suffered another episode. Now in NSR. Last echo 3/14 with EF 60-65%, mod dilated  [facility]. No recent stress test. Did experience some chest and neck discomfort with RVR.  Past Medical History  Diagnosis Date  . Anxiety   . Depression   . CVA (cerebral vascular accident) 12/2007  . Ventricular tachycardia     pt told at [facility] that she had RVOT VT  . Atrial fibrillation     paroxysmal, failed medical therapy with flecainide,  NSVT with tikosyn  . Thyroid disease   . Stroke   . Bruises easily    Past Surgical History  Procedure Laterality Date  . Vaginal hysterectomy    . Tonsillectomy    . Eye surgery    . Tee without cardioversion N/A 10/18/2012    Procedure: TRANSESOPHAGEAL ECHOCARDIOGRAM (TEE);  Surgeon: Lelon Perla, MD;  Location: [facility] ENDOSCOPY;  Service: Cardiovascular;  Laterality: N/A;  Pre-Ablation at 12pm  . Atrial fibrillation ablation  10/18/12    PVI by Dr Rayann Heman    Current Outpatient Prescriptions  Medication Sig Dispense Refill  . acetaminophen (TYLENOL) 500 MG tablet Take 2 tablets every 6 hours as needed for pain      . calcium carbonate (OS-CAL) 600 MG TABS Take 600 mg by mouth 2 (two) times daily with a meal.        . cholecalciferol (VITAMIN D) 1000 UNITS tablet Take 1,000 Units by mouth daily.        Marland Kitchen dextroamphetamine (DEXTROSTAT) 5 MG tablet Take 5 mg by mouth daily.       Marland Kitchen diltiazem (CARDIZEM CD) 180 MG 24 hr capsule Take 1 capsule (180 mg total) by mouth daily.  90 capsule  3   . docusate sodium (COLACE) 100 MG capsule Take 1 capsule (100 mg total) by mouth daily as needed for constipation.  30 capsule  0  . DULoxetine (CYMBALTA) 60 MG capsule Take 60 mg by mouth daily.      Marland Kitchen levothyroxine (SYNTHROID, LEVOTHROID) 25 MCG tablet TAKE 1 TABLET EVERY DAY  90 tablet  0  . Multiple Vitamin (MULTIVITAMIN) tablet Take 1 tablet by mouth daily.        . Oral Electrolytes (BUFFERED SALT PO) Take 1 capsule by mouth as needed.       . polyethylene glycol powder (GLYCOLAX/MIRALAX) powder Take 17 g by mouth daily as needed (constipation).      . Timolol Maleate (TIMOPTIC OP) Apply to eye daily.      . vitamin E 400 UNIT capsule Take 400 Units by mouth daily.        Alveda Reasons 20 MG TABS tablet TAKE 1 TABLET BY MOUTH EVERY DAY  90 tablet  1  . flecainide (TAMBOCOR) 50 MG tablet Take 1 tablet (50 mg total) by mouth 2 (two) times daily.  180 tablet  3   No current facility-administered medications for this visit.    Physical Exam: Filed Vitals:   04/25/14 1430  BP: 108/68  Pulse: 65  Height: 5\' 10"  (1.778 m)  Weight: 180 lb 9.6 oz (81.92 kg)    GEN- The patient is well appearing, alert and oriented x 3 today.   Head- normocephalic, atraumatic Eyes-  Sclera clear, conjunctiva pink Ears- hearing intact Oropharynx- clear Lungs- Clear to ausculation bilaterally, normal work of breathing Heart- Regular rate and rhythm, no murmurs, rubs or gallops, PMI not laterally displaced GI- soft, NT, ND, + BS Extremities- no clubbing, cyanosis, or edema  ekg today reveals sinus rhythm 65 bpm,nonspecific T wave changes.  Assessment and Plan:  1. Persistent Afib, s/p PVI 10/08/12.   Start flecainide 50 mg bid, continue diltiazem 180 mg daily. Continue xarelto.  2. Stress myoview, 2 weeks after initiation of flecainide for recent chest/neck pain with RVR,  assess for exercise induced arrythmia, R/O CAD with use of AAD.  3. F/u in afib clinic in 3 months.

## 2014-04-25 NOTE — Patient Instructions (Signed)
Your physician recommends that you schedule a follow-up appointment in: 3 months in afib clinic with Roderic Palau, NP  Your physician has requested that you have en exercise stress myoview. For further information please visit [email or web address]. Please follow instruction sheet, as given.--in about 2 weeks  Your physician has recommended you make the following change in your medication:  1) Start Flecainide 50mg  twice daily

## 2014-04-26 ENCOUNTER — Other Ambulatory Visit: Payer: Self-pay | Admitting: Internal Medicine

## 2014-04-27 ENCOUNTER — Telehealth: Payer: Self-pay | Admitting: Internal Medicine

## 2014-04-27 NOTE — Telephone Encounter (Signed)
New problem   Pt was put on new medication Flecainide and may be having a reaction from this. Please call.

## 2014-04-27 NOTE — Telephone Encounter (Signed)
She is going to keep taking the Flecainide as she is feeling some better and will let us know if this changes

## 2014-05-07 ENCOUNTER — Encounter: Payer: Self-pay | Admitting: Family Medicine

## 2014-05-07 ENCOUNTER — Ambulatory Visit (INDEPENDENT_AMBULATORY_CARE_PROVIDER_SITE_OTHER): Payer: Medicare Other | Admitting: Family Medicine

## 2014-05-07 VITALS — BP 122/78 | HR 71 | Temp 98.1°F | Wt 182.0 lb

## 2014-05-07 DIAGNOSIS — B351 Tinea unguium: Secondary | ICD-10-CM | POA: Insufficient documentation

## 2014-05-07 MED ORDER — TERBINAFINE HCL 250 MG PO TABS: 250.0000 mg | ORAL_TABLET | Freq: Every day | ORAL | Status: DC

## 2014-05-07 NOTE — Progress Notes (Signed)
Subjective:   Patient ID: Monica Whitaker, female    DOB: 11/16/1945, 68 y.o.   MRN: [id number]  Monica [facility]aker is a pleasant 68 y.o. year old female who presents to clinic today with Nail Problem  on 05/07/2014  HPI: ?nail fungus- Started with yellowish, thickening of one finger nail and now has spread to all 10 fingers nails within weeks. No swelling or irritation of cuticle/nail bed. No fevers.  Nails are more brittle- peeling a little.  Current Outpatient Prescriptions on File Prior to Visit  Medication Sig Dispense Refill  . acetaminophen (TYLENOL) 500 MG tablet Take 2 tablets every 6 hours as needed for pain      . calcium carbonate (OS-CAL) 600 MG TABS Take 600 mg by mouth 2 (two) times daily with a meal.        . cholecalciferol (VITAMIN D) 1000 UNITS tablet Take 1,000 Units by mouth daily.        Marland Kitchen dextroamphetamine (DEXTROSTAT) 5 MG tablet Take 5 mg by mouth daily.       Marland Kitchen diltiazem (CARDIZEM CD) 180 MG 24 hr capsule Take 1 capsule (180 mg total) by mouth daily.  90 capsule  3  . docusate sodium (COLACE) 100 MG capsule Take 1 capsule (100 mg total) by mouth daily as needed for constipation.  30 capsule  0  . DULoxetine (CYMBALTA) 60 MG capsule Take 60 mg by mouth daily.      . flecainide (TAMBOCOR) 50 MG tablet Take 1 tablet (50 mg total) by mouth 2 (two) times daily.  180 tablet  3  . levothyroxine (SYNTHROID, LEVOTHROID) 25 MCG tablet TAKE 1 TABLET EVERY [facility]  90 tablet  0  . Multiple Vitamin (MULTIVITAMIN) tablet Take 1 tablet by mouth daily.        . Oral Electrolytes (BUFFERED SALT PO) Take 1 capsule by mouth as needed.       . polyethylene glycol powder (GLYCOLAX/MIRALAX) powder Take 17 g by mouth daily as needed (constipation).      . Timolol Maleate (TIMOPTIC OP) Apply to eye daily.      . vitamin E 400 UNIT capsule Take 400 Units by mouth daily.        Alveda Reasons 20 MG TABS tablet TAKE 1 TABLET BY MOUTH EVERY [facility]  90 tablet  1   No current facility-administered  medications on file prior to visit.    Allergies  Allergen Reactions  . Darvon Other (See Comments)    Severe panic attacks  . Propoxyphene N-Acetaminophen Other (See Comments)    Severe panic attacks  . Levofloxacin Other (See Comments)    Causes panic attacks.    Past Medical History  Diagnosis Date  . Anxiety   . Depression   . CVA (cerebral vascular accident) 12/2007  . Ventricular tachycardia     pt told at [facility] that she had RVOT VT  . Atrial fibrillation     paroxysmal, failed medical therapy with flecainide,  NSVT with tikosyn  . Thyroid disease   . Stroke   . Bruises easily     Past Surgical History  Procedure Laterality Date  . Vaginal hysterectomy    . Tonsillectomy    . Eye surgery    . Tee without cardioversion N/A 10/18/2012    Procedure: TRANSESOPHAGEAL ECHOCARDIOGRAM (TEE);  Surgeon: Lelon Perla, MD;  Location: [facility];  Service: Cardiovascular;  Laterality: N/A;  Pre-Ablation at 12pm  . Atrial fibrillation ablation  10/18/12  PVI by Dr Rayann Heman    Family History  Problem Relation Age of Onset  . Multiple sclerosis Father     History   Social History  . Marital Status: Married    Spouse Name: N/A    Number of Children: 0  . Years of Education: N/A   Occupational History  . Retired    Social History Main Topics  . Smoking status: Never Smoker   . Smokeless tobacco: Never Used  . Alcohol Use: 0.5 oz/week    1 drink(s) per week     Comment: regular  . Drug Use: No  . Sexual Activity: Not on file   Other Topics Concern  . Not on file   Social History Narrative   Lives in [place] with her husband.  No children.  Former Psychologist, prison and probation services      Has a living will-    Would desire CPR   Would not want prolonged life support if futile.   The PMH, PSH, Social History, Family History, Medications, and allergies have been reviewed in [facility], and have been updated if relevant.   Review of Systems See HPI No fever No skin irritation,  swelling or redness    Objective:    BP 122/78  Pulse 71  Temp(Src) 98.1 F (36.7 C) (Oral)  Wt 182 lb (82.555 kg)  SpO2 96%   Physical Exam  Constitutional: She appears well-developed and well-nourished. No distress.  Musculoskeletal:       Hands: Skin: Skin is warm and dry.  Psychiatric: She has a normal mood and affect. Her behavior is normal. Judgment and thought content normal.          Assessment & Plan:   Nail fungus - Plan: Hepatic Function Panel No Follow-up on file.

## 2014-05-07 NOTE — Patient Instructions (Addendum)
Great to see you. Take Lamisil as directed- 1 tablet daily for 6 weeks. Good luck tomorrow.

## 2014-05-07 NOTE — Assessment & Plan Note (Signed)
New- given involvement of all 10 nails, will treat with oral lamisil 250 mg daily x 6 weeks. Check LFTs today. Call or return to clinic prn if these symptoms worsen or fail to improve as anticipated. The patient indicates understanding of these issues and agrees with the plan.

## 2014-05-07 NOTE — Progress Notes (Signed)
Pre visit review using our clinic review tool, if applicable. No additional management support is needed unless otherwise documented below in the visit note. 

## 2014-05-08 ENCOUNTER — Ambulatory Visit (HOSPITAL_COMMUNITY): Payer: Medicare Other | Attending: Internal Medicine | Admitting: Radiology

## 2014-05-08 VITALS — BP 137/86 | HR 63 | Ht 70.0 in | Wt 177.0 lb

## 2014-05-08 DIAGNOSIS — I4891 Unspecified atrial fibrillation: Secondary | ICD-10-CM | POA: Insufficient documentation

## 2014-05-08 DIAGNOSIS — R079 Chest pain, unspecified: Secondary | ICD-10-CM | POA: Insufficient documentation

## 2014-05-08 DIAGNOSIS — R0789 Other chest pain: Secondary | ICD-10-CM

## 2014-05-08 LAB — HEPATIC FUNCTION PANEL
ALBUMIN: 4.3 g/dL (ref 3.5–5.2)
ALT: 17 U/L (ref 0–35)
AST: 24 U/L (ref 0–37)
Alkaline Phosphatase: 56 U/L (ref 39–117)
Bilirubin, Direct: 0.1 mg/dL (ref 0.0–0.3)
Total Bilirubin: 0.7 mg/dL (ref 0.2–1.2)
Total Protein: 7.4 g/dL (ref 6.0–8.3)

## 2014-05-08 MED ORDER — REGADENOSON 0.4 MG/5ML IV SOLN
0.4000 mg | Freq: Once | INTRAVENOUS | Status: AC
  Administered 2014-05-08: 0.4 mg via INTRAVENOUS

## 2014-05-08 MED ORDER — TECHNETIUM TC 99M SESTAMIBI GENERIC - CARDIOLITE
10.0000 | Freq: Once | INTRAVENOUS | Status: AC | PRN
  Administered 2014-05-08: 10 via INTRAVENOUS

## 2014-05-08 MED ORDER — TECHNETIUM TC 99M SESTAMIBI GENERIC - CARDIOLITE
30.0000 | Freq: Once | INTRAVENOUS | Status: AC | PRN
  Administered 2014-05-08: 30 via INTRAVENOUS

## 2014-05-08 NOTE — Progress Notes (Signed)
[facility] [address] [place], [place] [address] [phone]    Cardiology Nuclear Med Study  Monica Whitaker is a 68 y.o. female     MRN : [id number]     DOB: 01/24/1946  Procedure Date: 05/08/2014  Nuclear Med Background Indication for Stress Test:  Evaluation for Ischemia and Abnormal EKG History:  Atrial Fib with ablation Cardiac Risk Factors: none  Symptoms:  Chest Pain   Nuclear Pre-Procedure Caffeine/Decaff Intake:  None NPO After: 7:00pm   Lungs:  clear O2 Sat: 97% on room air. IV 0.9% NS with Angio Cath:  22g  IV Site: L Wrist  IV Started by:  Matilde Haymaker, RN  Chest Size (in):  38 Cup Size: D  Height: 5\' 10"  (1.778 m)  Weight:  177 lb (80.287 kg)  BMI:  Body mass index is 25.4 kg/(m^2). Tech Comments:  n/a    Nuclear Med Study 1 or 2 day study: 1 day  Stress Test Type:  Treadmill/Lexiscan  Reading MD: n/a  Order Authorizing Provider:  Trude Mcburney  Resting Radionuclide: Technetium 19m Sestamibi  Resting Radionuclide Dose: 11.0 mCi   Stress Radionuclide:  Technetium 92m Sestamibi  Stress Radionuclide Dose: 33.0 mCi           Stress Protocol Rest HR: 63 Stress HR: 113  Rest BP: 137/86 Stress BP: 102/84  Exercise Time (min): n/a METS: n/a           Dose of Adenosine (mg):  n/a Dose of Lexiscan: 0.4 mg  Dose of Atropine (mg): n/a Dose of Dobutamine: n/a mcg/kg/min (at max HR)  Stress Test Technologist: Glade Lloyd, BS-ES  Nuclear Technologist:  Margie Ege     Rest Procedure:  Myocardial perfusion imaging was performed at rest 45 minutes following the intravenous administration of Technetium 37m Sestamibi. Rest ECG: Normal sinus rhythm. Nonspecific ST-T wave changes.  Stress Procedure:  The patient received IV Lexiscan 0.4 mg over 15-seconds with concurrent low level exercise and then Technetium 2m Sestamibi was injected at 30-seconds while the patient continued walking one more minute.  Quantitative  spect images were obtained after a 45-minute delay.  Attempted to walk patient on [name] Protocol but patient became fatigued and did not think she could continue into the next stage.  During the infusion of Lexiscan the patient complained of fatigue.  This symptom resolved in recovery.  Stress ECG: No significant change from baseline ECG  QPS Raw Data Images:  Normal; no motion artifact; normal heart/lung ratio. Stress Images:  Normal homogeneous uptake in all areas of the myocardium. Rest Images:  Normal homogeneous uptake in all areas of the myocardium. Subtraction (SDS):  No evidence of ischemia. Transient Ischemic Dilatation (Normal <1.22):  0.99 Lung/Heart Ratio (Normal <0.45):  0.33  Quantitative Gated Spect Images QGS EDV:  102 ml QGS ESV:  41 ml  Impression Exercise Capacity:  Lexiscan with low level exercise. BP Response:  Normal blood pressure response. Clinical Symptoms:  Fatigue ECG Impression:  No significant ST segment change suggestive of ischemia. Comparison with Prior Nuclear Study: No previous nuclear study performed  Overall Impression:  Normal stress nuclear study.  This is a low risk scan. There is no scar or ischemia.  LV Ejection Fraction: 60%.  LV Wall Motion:  Normal Wall Motion.  Dola Argyle, MD

## 2014-05-09 ENCOUNTER — Ambulatory Visit: Payer: Self-pay | Admitting: Family Medicine

## 2014-05-10 ENCOUNTER — Encounter: Payer: Self-pay | Admitting: Family Medicine

## 2014-05-29 ENCOUNTER — Other Ambulatory Visit: Payer: Self-pay | Admitting: *Deleted

## 2014-05-29 MED ORDER — LEVOTHYROXINE SODIUM 25 MCG PO TABS: ORAL_TABLET | ORAL | Status: DC

## 2014-07-12 ENCOUNTER — Encounter (HOSPITAL_COMMUNITY): Payer: Self-pay | Admitting: Internal Medicine

## 2014-07-31 ENCOUNTER — Telehealth: Payer: Self-pay

## 2014-07-31 ENCOUNTER — Telehealth: Payer: Self-pay | Admitting: *Deleted

## 2014-07-31 NOTE — Telephone Encounter (Signed)
PATIENT CALLED ABOUT GETTING A  PA ON HER XARELTO

## 2014-07-31 NOTE — Telephone Encounter (Signed)
Prior Authorization for Xarelto approved through 08/01/2015. [id number]

## 2014-08-09 ENCOUNTER — Ambulatory Visit (INDEPENDENT_AMBULATORY_CARE_PROVIDER_SITE_OTHER): Payer: Medicare Other | Admitting: Family Medicine

## 2014-08-09 ENCOUNTER — Encounter: Payer: Self-pay | Admitting: Family Medicine

## 2014-08-09 VITALS — BP 128/76 | HR 65 | Temp 98.4°F | Wt 180.5 lb

## 2014-08-09 DIAGNOSIS — R300 Dysuria: Secondary | ICD-10-CM

## 2014-08-09 LAB — POCT URINALYSIS DIPSTICK
Bilirubin, UA: NEGATIVE
Blood, UA: NEGATIVE
Glucose, UA: NEGATIVE
KETONES UA: NEGATIVE
Leukocytes, UA: NEGATIVE
Nitrite, UA: POSITIVE
PROTEIN UA: NEGATIVE
SPEC GRAV UA: 1.02
Urobilinogen, UA: 0.2
pH, UA: 6.5

## 2014-08-09 MED ORDER — CEPHALEXIN 500 MG PO CAPS: 500.0000 mg | ORAL_CAPSULE | Freq: Two times a day (BID) | ORAL | Status: AC

## 2014-08-09 NOTE — Patient Instructions (Signed)
Good to see you. Have a safe trip to [place].  Take Keflex as directed- 1 tablet twice daily x 7 days.  We will call you with your urine culture results.

## 2014-08-09 NOTE — Progress Notes (Signed)
SUBJECTIVE: Monica Whitaker is a 69 y.o. female who complains of urinary frequency, urgency and dysuria x 4 days, without flank pain, fever, chills, or abnormal vaginal discharge or bleeding.    Current Outpatient Prescriptions on File Prior to Visit  Medication Sig Dispense Refill  . acetaminophen (TYLENOL) 500 MG tablet Take 2 tablets every 6 hours as needed for pain    . calcium carbonate (OS-CAL) 600 MG TABS Take 600 mg by mouth 2 (two) times daily with a meal.      . cholecalciferol (VITAMIN D) 1000 UNITS tablet Take 1,000 Units by mouth daily.      Marland Kitchen dextroamphetamine (DEXTROSTAT) 5 MG tablet Take 5 mg by mouth daily.     Marland Kitchen diltiazem (CARDIZEM CD) 180 MG 24 hr capsule Take 1 capsule (180 mg total) by mouth daily. 90 capsule 3  . docusate sodium (COLACE) 100 MG capsule Take 1 capsule (100 mg total) by mouth daily as needed for constipation. 30 capsule 0  . DULoxetine (CYMBALTA) 60 MG capsule Take 60 mg by mouth daily.    . flecainide (TAMBOCOR) 50 MG tablet Take 1 tablet (50 mg total) by mouth 2 (two) times daily. 180 tablet 3  . levothyroxine (SYNTHROID, LEVOTHROID) 25 MCG tablet TAKE 1 TABLET EVERY DAY 90 tablet 1  . Multiple Vitamin (MULTIVITAMIN) tablet Take 1 tablet by mouth daily.      . Oral Electrolytes (BUFFERED SALT PO) Take 1 capsule by mouth as needed.     . polyethylene glycol powder (GLYCOLAX/MIRALAX) powder Take 17 g by mouth daily as needed (constipation).    . terbinafine (LAMISIL) 250 MG tablet Take 1 tablet (250 mg total) by mouth daily. 30 tablet 1  . Timolol Maleate (TIMOPTIC OP) Apply to eye daily.    . vitamin E 400 UNIT capsule Take 400 Units by mouth daily.      Alveda Reasons 20 MG TABS tablet TAKE 1 TABLET BY MOUTH EVERY DAY 90 tablet 1   No current facility-administered medications on file prior to visit.    Allergies  Allergen Reactions  . Darvon Other (See Comments)    Severe panic attacks  . Propoxyphene N-Acetaminophen Other (See Comments)    Severe panic  attacks  . Levofloxacin Other (See Comments)    Causes panic attacks.    Past Medical History  Diagnosis Date  . Anxiety   . Depression   . CVA (cerebral vascular accident) 12/2007  . Ventricular tachycardia     pt told at [facility] that she had RVOT VT  . Atrial fibrillation     paroxysmal, failed medical therapy with flecainide,  NSVT with tikosyn  . Thyroid disease   . Stroke   . Bruises easily     Past Surgical History  Procedure Laterality Date  . Vaginal hysterectomy    . Tonsillectomy    . Eye surgery    . Tee without cardioversion N/A 10/18/2012    Procedure: TRANSESOPHAGEAL ECHOCARDIOGRAM (TEE);  Surgeon: Lelon Perla, MD;  Location: [facility] ENDOSCOPY;  Service: Cardiovascular;  Laterality: N/A;  Pre-Ablation at 12pm  . Atrial fibrillation ablation  10/18/12    PVI by Dr Rayann Heman  . Atrial fibrillation ablation N/A 10/18/2012    Procedure: ATRIAL FIBRILLATION ABLATION;  Surgeon: Thompson Grayer, MD;  Location: [facility] CATH LAB;  Service: Cardiovascular;  Laterality: N/A;    Family History  Problem Relation Age of Onset  . Multiple sclerosis Father     History   Social History  .  Marital Status: Married    Spouse Name: N/A    Number of Children: 0  . Years of Education: N/A   Occupational History  . Retired    Social History Main Topics  . Smoking status: Never Smoker   . Smokeless tobacco: Never Used  . Alcohol Use: 0.5 oz/week    1 drink(s) per week     Comment: regular  . Drug Use: No  . Sexual Activity: Not on file   Other Topics Concern  . Not on file   Social History Narrative   Lives in [place] with her husband.  No children.  Former Psychologist, prison and probation services      Has a living will-    Would desire CPR   Would not want prolonged life support if futile.   The PMH, PSH, Social History, Family History, Medications, and allergies have been reviewed in [facility], and have been updated if relevant.  OBJECTIVE: BP 128/76 mmHg  Pulse 65  Temp(Src) 98.4 F (36.9 C)  (Oral)  Wt 180 lb 8 oz (81.874 kg)  SpO2 96%   Appears well, in no apparent distress.  Vital signs are normal. The abdomen is soft without tenderness, guarding, mass, rebound or organomegaly. No CVA tenderness or inguinal adenopathy noted. Urine dipstick shows positive for nitrates.   ASSESSMENT: UTI uncomplicated without evidence of pyelonephritis  PLAN: Treatment per orders - Keflex 500 mg twice daily x 7 days, send urine for cx, also push fluids, may use Pyridium OTC prn. Call or return to clinic prn if these symptoms worsen or fail to improve as anticipated.

## 2014-08-09 NOTE — Progress Notes (Signed)
Pre visit review using our clinic review tool, if applicable. No additional management support is needed unless otherwise documented below in the visit note. 

## 2014-08-11 LAB — URINE CULTURE: Colony Count: >=100000

## 2014-08-13 DIAGNOSIS — M791 Myalgia: Secondary | ICD-10-CM | POA: Diagnosis not present

## 2014-08-13 DIAGNOSIS — R51 Headache: Secondary | ICD-10-CM | POA: Diagnosis not present

## 2014-08-13 DIAGNOSIS — M530 Cervicocranial syndrome: Secondary | ICD-10-CM | POA: Diagnosis not present

## 2014-08-14 DIAGNOSIS — R51 Headache: Secondary | ICD-10-CM | POA: Diagnosis not present

## 2014-08-14 DIAGNOSIS — M791 Myalgia: Secondary | ICD-10-CM | POA: Diagnosis not present

## 2014-08-14 DIAGNOSIS — M530 Cervicocranial syndrome: Secondary | ICD-10-CM | POA: Diagnosis not present

## 2014-09-11 ENCOUNTER — Encounter: Payer: Self-pay | Admitting: Nurse Practitioner

## 2014-09-11 ENCOUNTER — Ambulatory Visit (INDEPENDENT_AMBULATORY_CARE_PROVIDER_SITE_OTHER): Payer: Medicare Other | Admitting: Nurse Practitioner

## 2014-09-11 VITALS — BP 104/68 | HR 62 | Ht 70.0 in | Wt 183.0 lb

## 2014-09-11 DIAGNOSIS — I4819 Other persistent atrial fibrillation: Secondary | ICD-10-CM

## 2014-09-11 DIAGNOSIS — I481 Persistent atrial fibrillation: Secondary | ICD-10-CM

## 2014-09-11 DIAGNOSIS — I4891 Unspecified atrial fibrillation: Secondary | ICD-10-CM | POA: Diagnosis not present

## 2014-09-11 NOTE — Patient Instructions (Signed)
Your physician recommends that you schedule a follow-up appointment in 3 months with Dr Allred    

## 2014-09-11 NOTE — Progress Notes (Signed)
PCP: Arnette Norris, MD  [facility] is a 69 y.o. female who presents today for routine electrophysiology followup.  H/o afib ablation 3/8//14. She had done well maintaining NSR until September 2015. She was on vacation and woke up in afib with RVR. She took 2 diltiazems a couple hours apart but failed to return to [place]. Since she was uncomfortable she went to the ER and spontaneously converted. She was instructed to go on Diltiazem daily. She had maintained NSR until am of last visit (04/25/14) when she suffered another episode. In NSR when in office.. Last echo 3/14 with EF 60-65%, mod dilated  [facility]. Stress test scheduld and  negative for ischemia.  Started on Flecainide 50 mg bid. She still reports breakthrough afib about 12 hours every 2-3 weeks. Takes an extra diltiazem when this occurs. Has to go to bed with afib due to fatigue. Otherwise, doing well, no other complaints.  Past Medical History  Diagnosis Date  . Anxiety   . Depression   . CVA (cerebral vascular accident) 12/2007  . Ventricular tachycardia     pt told at [facility] that she had RVOT VT  . Atrial fibrillation     paroxysmal, failed medical therapy with flecainide,  NSVT with tikosyn  . Thyroid disease   . Stroke   . Bruises easily    Past Surgical History  Procedure Laterality Date  . Vaginal hysterectomy    . Tonsillectomy    . Eye surgery    . Tee without cardioversion N/A 10/18/2012    Procedure: TRANSESOPHAGEAL ECHOCARDIOGRAM (TEE);  Surgeon: Lelon Perla, MD;  Location: [facility] ENDOSCOPY;  Service: Cardiovascular;  Laterality: N/A;  Pre-Ablation at 12pm  . Atrial fibrillation ablation  10/18/12    PVI by Dr Rayann Heman  . Atrial fibrillation ablation N/A 10/18/2012    Procedure: ATRIAL FIBRILLATION ABLATION;  Surgeon: Thompson Grayer, MD;  Location: [facility] CATH LAB;  Service: Cardiovascular;  Laterality: N/A;    Current Outpatient Prescriptions  Medication Sig Dispense Refill  . acetaminophen (TYLENOL) 500 MG tablet Take 500 mg by  mouth every 6 (six) hours as needed. Take 2 tablets every 6 hours as needed for pain    . calcium carbonate (OS-CAL) 600 MG TABS Take 600 mg by mouth 2 (two) times daily with a meal.      . cholecalciferol (VITAMIN D) 1000 UNITS tablet Take 1,000 Units by mouth daily.      Marland Kitchen dextroamphetamine (DEXTROSTAT) 5 MG tablet Take 5 mg by mouth daily.     Marland Kitchen diltiazem (CARDIZEM CD) 180 MG 24 hr capsule Take 1 capsule (180 mg total) by mouth daily. 90 capsule 3  . docusate sodium (COLACE) 100 MG capsule Take 1 capsule (100 mg total) by mouth daily as needed for constipation. 30 capsule 0  . DULoxetine (CYMBALTA) 60 MG capsule Take 60 mg by mouth daily.    . flecainide (TAMBOCOR) 50 MG tablet Take 1 tablet (50 mg total) by mouth 2 (two) times daily. 180 tablet 3  . levothyroxine (SYNTHROID, LEVOTHROID) 25 MCG tablet Take 25 mcg by mouth daily before breakfast.    . Multiple Vitamin (MULTIVITAMIN) tablet Take 1 tablet by mouth daily.      . Oral Electrolytes (BUFFERED SALT PO) Take 1 capsule by mouth as needed.     . polyethylene glycol powder (GLYCOLAX/MIRALAX) powder Take 17 g by mouth daily as needed (constipation).    . terbinafine (LAMISIL) 250 MG tablet Take 1 tablet (250 mg total) by mouth  daily. 30 tablet 1  . Timolol Maleate (TIMOPTIC OP) Place 1 drop into the left eye daily.     . vitamin E 400 UNIT capsule Take 400 Units by mouth daily.      Alveda Reasons 20 MG TABS tablet TAKE 1 TABLET BY MOUTH EVERY DAY 90 tablet 1   No current facility-administered medications for this visit.    Physical Exam: Filed Vitals:   09/11/14 1146  BP: 104/68  Pulse: 62  Height: 5\' 10"  (1.778 m)  Weight: 183 lb (83.008 kg)    GEN- The patient is well appearing, alert and oriented x 3 today.   Head- normocephalic, atraumatic Eyes-  Sclera clear, conjunctiva pink Ears- hearing intact Oropharynx- clear Lungs- Clear to ausculation bilaterally, normal work of breathing Heart- Regular rate and rhythm, no murmurs,  rubs or gallops, PMI not laterally displaced GI- soft, NT, ND, + BS Extremities- no clubbing, cyanosis, or edema  ekg today reveals sinus rhythm 62 bpm, PR int 150 ms, QRS 94 ms, QRSc 444 ms.  Assessment and Plan:  1. Persistent Afib, s/p PVI 10/08/12.   Continue flecainide 50 mg bid, continue diltiazem 180 mg daily. Will get a flecainide level to see if room to increase to 75 mg bid for less afib burden. Continue xarelto.  2. F/u with Dr. Rayann Heman in 3 months, sooner if flecainide level is increased.

## 2014-09-13 ENCOUNTER — Ambulatory Visit: Payer: Medicare Other | Admitting: Nurse Practitioner

## 2014-09-14 ENCOUNTER — Ambulatory Visit (HOSPITAL_COMMUNITY): Payer: Medicare Other | Admitting: Nurse Practitioner

## 2014-09-16 LAB — FLECAINIDE LEVEL: FLECAINIDE: 0.32 ug/mL (ref 0.20–1.00)

## 2014-09-17 ENCOUNTER — Telehealth: Payer: Self-pay | Admitting: *Deleted

## 2014-09-17 DIAGNOSIS — I4891 Unspecified atrial fibrillation: Secondary | ICD-10-CM

## 2014-09-17 MED ORDER — FLECAINIDE ACETATE 50 MG PO TABS: 75.0000 mg | ORAL_TABLET | Freq: Two times a day (BID) | ORAL | Status: DC

## 2014-09-17 NOTE — Telephone Encounter (Signed)
-----   Message from Roderic Palau, NP sent at 09/17/2014 10:20 AM EST ----- Claiborne Billings, please inform pt that flecainide level is low enough that flecainide can be increased to 75 mg bid to try to reduce her afib burden.Marland Kitchen She will need an EKG repeated  in one week.

## 2014-09-17 NOTE — Telephone Encounter (Signed)
Patient notified of the need to increase flecainide to 75mg  BID and repeat EKG in 1 week (appt made for 2/23). Patient requesting new prescription be called in for the 75mg  as the 50mg  tablets are very small.explained only 50mg  tablets available - pt states she will try to cut them in half

## 2014-09-18 DIAGNOSIS — B351 Tinea unguium: Secondary | ICD-10-CM | POA: Diagnosis not present

## 2014-09-18 DIAGNOSIS — L570 Actinic keratosis: Secondary | ICD-10-CM | POA: Diagnosis not present

## 2014-09-25 ENCOUNTER — Encounter: Payer: Self-pay | Admitting: Nurse Practitioner

## 2014-09-25 ENCOUNTER — Ambulatory Visit (INDEPENDENT_AMBULATORY_CARE_PROVIDER_SITE_OTHER): Payer: Medicare Other | Admitting: *Deleted

## 2014-09-25 DIAGNOSIS — I481 Persistent atrial fibrillation: Secondary | ICD-10-CM | POA: Diagnosis not present

## 2014-09-25 DIAGNOSIS — I4819 Other persistent atrial fibrillation: Secondary | ICD-10-CM

## 2014-10-17 ENCOUNTER — Ambulatory Visit (INDEPENDENT_AMBULATORY_CARE_PROVIDER_SITE_OTHER): Payer: Medicare Other | Admitting: Family Medicine

## 2014-10-17 VITALS — BP 90/62 | HR 80 | Temp 98.5°F | Ht 70.0 in | Wt 179.2 lb

## 2014-10-17 DIAGNOSIS — I69398 Other sequelae of cerebral infarction: Secondary | ICD-10-CM

## 2014-10-17 DIAGNOSIS — R269 Unspecified abnormalities of gait and mobility: Secondary | ICD-10-CM

## 2014-10-17 DIAGNOSIS — M7501 Adhesive capsulitis of right shoulder: Secondary | ICD-10-CM | POA: Diagnosis not present

## 2014-10-17 MED ORDER — METHYLPREDNISOLONE ACETATE 40 MG/ML IJ SUSP
80.0000 mg | Freq: Once | INTRAMUSCULAR | Status: AC
  Administered 2014-10-17: 80 mg via INTRA_ARTICULAR

## 2014-10-17 NOTE — Progress Notes (Signed)
Pre visit review using our clinic review tool, if applicable. No additional management support is needed unless otherwise documented below in the visit note. 

## 2014-10-17 NOTE — Progress Notes (Signed)
Dr. Frederico Hamman T. Kimber Fritts, MD, [facility] [place], [address] Phone: [phone] Fax: [phone]  10/17/2014  Patient: Monica [facility], MRN: [id number], DOB: [birth year], 69 y.o.  Primary Physician:  Arnette Norris, MD  Chief Complaint: Shoulder Pain and Leg Issues  Subjective:   [facility] is a 69 y.o. very pleasant female patient who presents with the following:  RHD - started about 3 months ago and a month ago it got really bad. Yoga and core will hurt it a whole lot. IROM hurts it a lot more. She has no known specific injury that she can recall.  Patient is right-hand dominant.  About 3 months ago, the patient started to have an indolent onset of some shoulder pain, and she started to have more and more pain with abduction and internal range of motion.  She also has some pain in a T-shirt distribution, and she has pain at night when she rolls on that side.  She has never had any dislocation or prior operations in the affected shoulder.  CVA 6 years ago. Does water aerobics, yoga, core work. Even with all that, she does not walk right and will get some lower back pain  Past Medical History, Surgical History, Social History, Family History, Problem List, Medications, and Allergies have been reviewed and updated if relevant.  GEN: No fevers, chills. Nontoxic. Primarily MSK c/o today. MSK: Detailed in the HPI GI: tolerating PO intake without difficulty Neuro: No numbness, parasthesias, or tingling associated. Otherwise the pertinent positives of the ROS are noted above.   Objective:   BP 90/62 mmHg  Pulse 80  Temp(Src) 98.5 F (36.9 C) (Oral)  Ht 5\' 10"  (1.778 m)  Wt 179 lb 4 oz (81.307 kg)  BMI 25.72 kg/m2   GEN: WDWN, NAD, Non-toxic, Alert & Oriented x 3 HEENT: Atraumatic, Normocephalic.  Ears and Nose: No external deformity. EXTR: No clubbing/cyanosis/edema NEURO: Normal gait.  PSYCH: Normally interactive.  Conversant. Not depressed or anxious appearing.  Calm demeanor.   Shoulder: R and L Inspection: No muscle wasting or winging Ecchymosis/edema: neg  AC joint, scapula, clavicle: NT Cervical spine: NT, full ROM Spurling's: neg ABNORMAL SIDE TESTED: R UNLESS OTHERWISE NOTED, THE CONTRALATERAL SIDE HAS FULL RANGE OF MOTION. Abduction: 5/5, LIMITED TO 140 DEGREES Flexion: 5/5, LIMITED TO 170 DEGNO ROM  IR, lift-off: 5/5. TESTED AT 90 DEGREES OF ABDUCTION, LIMITED TO none DEGREES ER at neutral:  5/5, TESTED AT 90 DEGREES OF ABDUCTION, LIMITED TO 70 DEGREES AC crossover and compression: PAIN Drop Test: neg Empty Can: neg Supraspinatus insertion: NT Bicipital groove: NT ALL OTHER SPECIAL TESTING EQUIVOCAL GIVEN LOSS OF MOTION C5-T1 intact Sensation intact Grip 5/5    Radiology: No results found.   Assessment and Plan:   Frozen shoulder, right - Plan: methylPREDNISolone acetate (DEPO-MEDROL) injection 80 mg  Gait disturbance, post-stroke  Classic frozen shoulder on the right.  I gave her [facility]'s rehabilitation program for frozen shoulder.  We're also going to do an intra-articular injection.  We discussed formal physical therapy, but there are some insurance barriers for her to do this at [facility].  She does have an altered gait, and I think this is entirely from her prior stroke, altered sensation, and she Arty is using some partial AFOs sometimes.  The have a balance and gait training class at [facility] exercise facility, so I recommended that she start going to this.  Intrarticular Shoulder Injection,  RIGHT Verbal consent was obtained from the patient. Risks including infection explained and contrasted with benefits and alternatives. Patient prepped with Chloraprep and Ethyl Chloride used for anesthesia. An intraarticular shoulder injection was performed using the posterior approach. The patient tolerated the procedure well and had decreased pain post injection. No  complications. Injection: 8 cc of Lidocaine 1% and Depo-Medrol 80 mg. Needle: 22 gauge   Follow-up: 2 mo  Signed,  Paulyne Mooty T. Timia Casselman, MD   Patient's Medications  New Prescriptions   No medications on file  Previous Medications   ACETAMINOPHEN (TYLENOL) 500 MG TABLET    Take 500 mg by mouth every 6 (six) hours as needed. Take 2 tablets every 6 hours as needed for pain   CALCIUM CARBONATE (OS-CAL) 600 MG TABS    Take 600 mg by mouth 2 (two) times daily with a meal.     CHOLECALCIFEROL (VITAMIN D) 1000 UNITS TABLET    Take 1,000 Units by mouth daily.     DEXTROAMPHETAMINE (DEXTROSTAT) 5 MG TABLET    Take 5 mg by mouth daily.    DILTIAZEM (CARDIZEM CD) 180 MG 24 HR CAPSULE    Take 1 capsule (180 mg total) by mouth daily.   DOCUSATE SODIUM (COLACE) 100 MG CAPSULE    Take 1 capsule (100 mg total) by mouth daily as needed for constipation.   DULOXETINE (CYMBALTA) 60 MG CAPSULE    Take 60 mg by mouth daily.   FLECAINIDE (TAMBOCOR) 50 MG TABLET    Take 1.5 tablets (75 mg total) by mouth 2 (two) times daily.   LEVOTHYROXINE (SYNTHROID, LEVOTHROID) 25 MCG TABLET    Take 25 mcg by mouth daily before breakfast.   MULTIPLE VITAMIN (MULTIVITAMIN) TABLET    Take 1 tablet by mouth daily.     ORAL ELECTROLYTES (BUFFERED SALT PO)    Take 1 capsule by mouth as needed.    POLYETHYLENE GLYCOL POWDER (GLYCOLAX/MIRALAX) POWDER    Take 17 g by mouth daily as needed (constipation).   TERBINAFINE (LAMISIL) 250 MG TABLET    Take 1 tablet (250 mg total) by mouth daily.   TIMOLOL MALEATE (TIMOPTIC OP)    Place 1 drop into the left eye daily.    VITAMIN E 400 UNIT CAPSULE    Take 400 Units by mouth daily.     XARELTO 20 MG TABS TABLET    TAKE 1 TABLET BY MOUTH EVERY DAY  Modified Medications   No medications on file  Discontinued Medications   No medications on file

## 2014-10-19 ENCOUNTER — Encounter: Payer: Self-pay | Admitting: Family Medicine

## 2014-11-06 ENCOUNTER — Other Ambulatory Visit: Payer: Self-pay | Admitting: Internal Medicine

## 2014-11-07 ENCOUNTER — Emergency Department
Admit: 2014-11-07 | Disposition: A | Discharge status: Home / Self Care | Payer: Self-pay | Admitting: Emergency Medicine

## 2014-11-07 ENCOUNTER — Ambulatory Visit
Admit: 2014-11-07 | Disposition: A | Discharge status: Home / Self Care | Payer: Self-pay | Attending: Orthopedic Surgery | Admitting: Orthopedic Surgery

## 2014-11-07 DIAGNOSIS — I4891 Unspecified atrial fibrillation: Secondary | ICD-10-CM | POA: Diagnosis not present

## 2014-11-07 DIAGNOSIS — S52531A Colles' fracture of right radius, initial encounter for closed fracture: Secondary | ICD-10-CM | POA: Diagnosis not present

## 2014-11-07 DIAGNOSIS — E039 Hypothyroidism, unspecified: Secondary | ICD-10-CM | POA: Diagnosis not present

## 2014-11-07 DIAGNOSIS — Z90710 Acquired absence of both cervix and uterus: Secondary | ICD-10-CM | POA: Diagnosis not present

## 2014-11-07 DIAGNOSIS — Z9889 Other specified postprocedural states: Secondary | ICD-10-CM | POA: Diagnosis not present

## 2014-11-07 DIAGNOSIS — S52501D Unspecified fracture of the lower end of right radius, subsequent encounter for closed fracture with routine healing: Secondary | ICD-10-CM | POA: Diagnosis not present

## 2014-11-07 DIAGNOSIS — Z79899 Other long term (current) drug therapy: Secondary | ICD-10-CM | POA: Diagnosis not present

## 2014-11-07 DIAGNOSIS — Z888 Allergy status to other drugs, medicaments and biological substances status: Secondary | ICD-10-CM | POA: Diagnosis not present

## 2014-11-07 DIAGNOSIS — Z7901 Long term (current) use of anticoagulants: Secondary | ICD-10-CM | POA: Diagnosis not present

## 2014-11-07 DIAGNOSIS — S52501A Unspecified fracture of the lower end of right radius, initial encounter for closed fracture: Secondary | ICD-10-CM | POA: Diagnosis not present

## 2014-11-07 DIAGNOSIS — Z8673 Personal history of transient ischemic attack (TIA), and cerebral infarction without residual deficits: Secondary | ICD-10-CM | POA: Diagnosis not present

## 2014-11-07 DIAGNOSIS — I69351 Hemiplegia and hemiparesis following cerebral infarction affecting right dominant side: Secondary | ICD-10-CM | POA: Diagnosis not present

## 2014-11-07 DIAGNOSIS — S52611A Displaced fracture of right ulna styloid process, initial encounter for closed fracture: Secondary | ICD-10-CM | POA: Diagnosis not present

## 2014-11-07 LAB — URINALYSIS, COMPLETE
BACTERIA: NONE SEEN
BLOOD: NEGATIVE
Bilirubin,UR: NEGATIVE
Glucose,UR: NEGATIVE mg/dL (ref 0–75)
NITRITE: NEGATIVE
PH: 6 (ref 4.5–8.0)
PROTEIN: NEGATIVE
RBC,UR: 12 /HPF (ref 0–5)
SPECIFIC GRAVITY: 1.02 (ref 1.003–1.030)
Squamous Epithelial: NONE SEEN
WBC UR: 2 /HPF (ref 0–5)

## 2014-11-07 LAB — CBC
HCT: 41.2 % (ref 35.0–47.0)
HGB: 13.5 g/dL (ref 12.0–16.0)
MCH: 30 pg (ref 26.0–34.0)
MCHC: 32.7 g/dL (ref 32.0–36.0)
MCV: 92 fL (ref 80–100)
Platelet: 275 10*3/uL (ref 150–440)
RBC: 4.49 10*6/uL (ref 3.80–5.20)
RDW: 13.9 % (ref 11.5–14.5)
WBC: 5.3 10*3/uL (ref 3.6–11.0)

## 2014-11-07 LAB — BASIC METABOLIC PANEL
ANION GAP: 8 (ref 7–16)
BUN: 22 mg/dL — AB
Calcium, Total: 9.2 mg/dL
Chloride: 100 mmol/L — ABNORMAL LOW
Co2: 28 mmol/L
Creatinine: 0.6 mg/dL
EGFR (Non-African Amer.): >60
Glucose: 107 mg/dL — ABNORMAL HIGH
POTASSIUM: 4.3 mmol/L
Sodium: 136 mmol/L

## 2014-11-07 LAB — PROTIME-INR
INR: 1.4
Prothrombin Time: 17 secs — ABNORMAL HIGH

## 2014-11-07 LAB — APTT: Activated PTT: 27 secs (ref 23.6–35.9)

## 2014-11-09 ENCOUNTER — Telehealth: Payer: Self-pay | Admitting: Family Medicine

## 2014-11-09 ENCOUNTER — Ambulatory Visit: Payer: Medicare Other | Admitting: Family Medicine

## 2014-11-09 ENCOUNTER — Emergency Department
Admit: 2014-11-09 | Disposition: A | Discharge status: Home / Self Care | Payer: Self-pay | Admitting: Emergency Medicine

## 2014-11-09 DIAGNOSIS — M7989 Other specified soft tissue disorders: Secondary | ICD-10-CM | POA: Diagnosis not present

## 2014-11-09 DIAGNOSIS — S52531D Colles' fracture of right radius, subsequent encounter for closed fracture with routine healing: Secondary | ICD-10-CM | POA: Diagnosis not present

## 2014-11-09 DIAGNOSIS — D68318 Other hemorrhagic disorder due to intrinsic circulating anticoagulants, antibodies, or inhibitors: Secondary | ICD-10-CM | POA: Diagnosis not present

## 2014-11-09 DIAGNOSIS — G8918 Other acute postprocedural pain: Secondary | ICD-10-CM | POA: Diagnosis not present

## 2014-11-09 DIAGNOSIS — S0990XA Unspecified injury of head, initial encounter: Secondary | ICD-10-CM | POA: Diagnosis not present

## 2014-11-09 DIAGNOSIS — F419 Anxiety disorder, unspecified: Secondary | ICD-10-CM | POA: Diagnosis not present

## 2014-11-09 DIAGNOSIS — R51 Headache: Secondary | ICD-10-CM | POA: Diagnosis not present

## 2014-11-09 DIAGNOSIS — M79641 Pain in right hand: Secondary | ICD-10-CM | POA: Diagnosis not present

## 2014-11-09 DIAGNOSIS — Z7901 Long term (current) use of anticoagulants: Secondary | ICD-10-CM | POA: Diagnosis not present

## 2014-11-09 DIAGNOSIS — I4891 Unspecified atrial fibrillation: Secondary | ICD-10-CM | POA: Diagnosis not present

## 2014-11-09 NOTE — Telephone Encounter (Signed)
Pt has appt for H/A with Monica Whitaker at [facility] on 11/10/14 at 9:30 AM.

## 2014-11-09 NOTE — Telephone Encounter (Signed)
Post op patient with headache - do we have any more info on sxs? Sounds like pt has decided to schedule appt for evaluation tomorrow at [place] [facility] which sounds reasonable as long as not having severe headache or other neurological sxs.

## 2014-11-09 NOTE — Telephone Encounter (Signed)
[facility] declines triage services. I tried to access the severity of her symptoms, but she is upset and doesn't want to discuss anything at this time. Encouraged to call back with any concerns, questions or if we can help her further at any time. She shares she made an appointment for tomorrow morning and doesn't want to talk further at this time. Called the office backline and notified Earl Lagos, CMA who will notify the MD on call.

## 2014-11-09 NOTE — Telephone Encounter (Signed)
Spoke with [facility] [employer] regarding pt. I did not advise her that I would forward this message to the on call Dr. I informed her that Dr Deborra Medina was out of office on today and that I was not aware of who was on call. I explained to her that normally it is whichever provider is working Saturday clinic, but I did not tell her that I would forward this info to them, as they are not at this office. [facility] stated that she would forward it to a physician and have them review it since this pt is post op and had a HA. I will be more than glad to forward it to a provider here at [facility], although they were not on call for tomorrow, so that it can be reviewed, in the event that the pt needed an immediate assessment versus waiting until tomorrow.

## 2014-11-10 ENCOUNTER — Ambulatory Visit (INDEPENDENT_AMBULATORY_CARE_PROVIDER_SITE_OTHER): Payer: Medicare Other | Admitting: Family Medicine

## 2014-11-10 ENCOUNTER — Encounter: Payer: Self-pay | Admitting: Family Medicine

## 2014-11-10 VITALS — BP 104/80 | Temp 98.1°F | Wt 178.0 lb

## 2014-11-10 DIAGNOSIS — G44209 Tension-type headache, unspecified, not intractable: Secondary | ICD-10-CM

## 2014-11-10 NOTE — Patient Instructions (Signed)
It is ok to take the oxycodone 2 tabs every 4 hours but I would definitely take 1 every 8 hours until your surgery.   If that doesn't work 1 every 6 hours If that doesn't work 1 every 4 hours You can take 2 tabs if needed every 4 hours.   Glad the headaches are better. i think we need to stay ahead of the arm pain. Your neurological exam appears to be at your baseline.

## 2014-11-10 NOTE — Progress Notes (Signed)
[facility] PCP: Arnette Norris, MD  Subjective:  Monica Whitaker is a 69 y.o. year old very pleasant female patient who presents for headache follow up -Patient had a closed reduction and casting done for a fracture in her R arm on Wednesday. She was given oxycodone to use at home for pain and she is not sure if she took any Wednesday evening. Thursday morning she woke up with severe R arm pain as well as a diffuse headache rated as 10/10. The headache persisted throughout Thursday and Friday. She went to the emergency room at [facility] on Friday and was there most of the day. Due to severity of headache, reportedly had a CT which was read as normal. Her orthopedist evaluated her and stated she would need a follow up surgery and [place]. She states she did take 2 oxycodone once over last 2 days when usually only took 1 sparingly. She states she took 2 yesterday evening and her headache resolved. She comes today for my advice on if is truly ok to take that much medication.   ROS- no blurry vision, no worsening dizziness. baselien right sided weakness with history of CVA and history of falls though no falls since the fall in yoga that led to this R arm fracture.   Pertinent Past Medical History- a fib, history CVA, hypothyroidism, hisotyr of neck pain  Medications- reviewed  Current Outpatient Prescriptions  Medication Sig Dispense Refill  . calcium carbonate (OS-CAL) 600 MG TABS Take 600 mg by mouth 2 (two) times daily with a meal.      . cholecalciferol (VITAMIN D) 1000 UNITS tablet Take 1,000 Units by mouth daily.      Marland Kitchen dextroamphetamine (DEXTROSTAT) 5 MG tablet Take 5 mg by mouth daily.     Marland Kitchen diltiazem (CARDIZEM CD) 180 MG 24 hr capsule Take 1 capsule (180 mg total) by mouth daily. 90 capsule 3  . DULoxetine (CYMBALTA) 60 MG capsule Take 60 mg by mouth daily.    . flecainide (TAMBOCOR) 50 MG tablet Take 1.5 tablets (75 mg total) by  mouth 2 (two) times daily. 270 tablet 3  . levothyroxine (SYNTHROID, LEVOTHROID) 25 MCG tablet Take 25 mcg by mouth daily before breakfast.    . Multiple Vitamin (MULTIVITAMIN) tablet Take 1 tablet by mouth daily.      Marland Kitchen terbinafine (LAMISIL) 250 MG tablet Take 1 tablet (250 mg total) by mouth daily. 30 tablet 1  . Timolol Maleate (TIMOPTIC OP) Place 1 drop into the left eye daily.     . vitamin E 400 UNIT capsule Take 400 Units by mouth daily.      Alveda Reasons 20 MG TABS tablet TAKE 1 TABLET BY MOUTH EVERY DAY 90 tablet 1  . acetaminophen (TYLENOL) 500 MG tablet Take 500 mg by mouth every 6 (six) hours as needed. Take 2 tablets every 6 hours as needed for pain    . docusate sodium (COLACE) 100 MG capsule Take 1 capsule (100 mg total) by mouth daily as needed for constipation. (Patient not taking: Reported on 11/10/2014) 30 capsule 0  . Oral Electrolytes (BUFFERED SALT PO) Take 1 capsule by mouth as needed.     . polyethylene glycol powder (GLYCOLAX/MIRALAX) powder Take 17 g by mouth daily as needed (constipation).     Objective: BP 104/80 mmHg  Temp(Src) 98.1 F (36.7 C)  Wt 178 lb (80.74 kg) Gen: NAD, resting comfortably NCAT, Mucous membranes are moist. CV:  RRR no murmurs rubs or gallops Lungs: CTAB no crackles, wheeze, rhonchi Ext: no edema Skin: warm, dry, no rash Neuro: CN II-XII intact, sensation and reflexes normal throughout, 5/5 muscle strength in bilateral left upper and lower extremities. 4/5 strength RLE and did not test RUE as in cast. Normal gait.   Assessment/Plan:  Headache  I believe patient developed severe headaches likely tension type after getting behind on her pain management as she was hesitant to take oxycodone. See AVS but we walked through staying ahead of pain. She had a normal neuro exam today and had a reportedly normal head CT yesterday so I do not think she would benefit from additional imaging. Return precautions advised. Wished her luck on her upcoming  surgery this week.

## 2014-11-12 ENCOUNTER — Ambulatory Visit (INDEPENDENT_AMBULATORY_CARE_PROVIDER_SITE_OTHER): Payer: Medicare Other | Admitting: Family Medicine

## 2014-11-12 ENCOUNTER — Telehealth: Payer: Self-pay | Admitting: Family Medicine

## 2014-11-12 ENCOUNTER — Encounter: Payer: Self-pay | Admitting: Family Medicine

## 2014-11-12 ENCOUNTER — Telehealth: Payer: Self-pay

## 2014-11-12 VITALS — BP 110/72 | HR 67 | Temp 97.9°F | Resp 16 | Wt 178.0 lb

## 2014-11-12 DIAGNOSIS — S62101A Fracture of unspecified carpal bone, right wrist, initial encounter for closed fracture: Secondary | ICD-10-CM | POA: Insufficient documentation

## 2014-11-12 DIAGNOSIS — S62101K Fracture of unspecified carpal bone, right wrist, subsequent encounter for fracture with nonunion: Secondary | ICD-10-CM | POA: Diagnosis not present

## 2014-11-12 DIAGNOSIS — S62109A Fracture of unspecified carpal bone, unspecified wrist, initial encounter for closed fracture: Secondary | ICD-10-CM

## 2014-11-12 HISTORY — DX: Fracture of unspecified carpal bone, unspecified wrist, initial encounter for closed fracture: S62.109A

## 2014-11-12 NOTE — Telephone Encounter (Signed)
Patient notified as instructed by telephone and verbalized understanding. Patient stated that she may want to keep the appointment with Dr. Deborra Medina this afternoon and it depends on when she can see orth. Patient stated that she will call back and cancel the appointment for this afternoon if she decides to after she finds out when she is gong to see Dr. Amedeo Plenty. Called and advised Candy per Dr. Deborra Medina. Candy stated that she will call the patient back now. Candy stated that Dr. Amedeo Plenty would like to see the patient Wednesday and she will let the patient know that.

## 2014-11-12 NOTE — Telephone Encounter (Signed)
Pt called to let us know that she is taking oxycodone hcl.  The directionS are 1-2 tabs Q 4-6 HRS.  Pt states that she is only taking 1 tablet.  Thanks.

## 2014-11-12 NOTE — Telephone Encounter (Signed)
She does not need to see me first.  She absolutely needs to see ortho!  Please call Candy and patient to let them know.

## 2014-11-12 NOTE — Assessment & Plan Note (Signed)
>  25 minutes spent in face to face time with patient, >50% spent in counselling or coordination of care She does seem a little altered by narcotics- she is not driving.  Appears to be taking them as prescribed but this is likely contributing to her tearfulness and anxiety. Expressed understanding with her frustration and also strongly advised that she schedule the [facility] to come to her home at least once a week so that she can rest her arm.  She is also still at an increased fall risk. The patient indicates understanding of these issues and agrees with the plan.

## 2014-11-12 NOTE — Telephone Encounter (Signed)
Candy with [place] spoke with pt to schedule appt with Dr Amedeo Plenty at [facility] ortho; pt advised she was told not to schedule appt until seen by Dr Deborra Medina. Candy said late on 11/09/14 received call from [facility] ortho to get pt in with Dr Amedeo Plenty. Date of injury 11/07/14 and pt has distal radius fx. Candy request cb. Pt has appt to see Dr Deborra Medina today at 2:30 PM.

## 2014-11-12 NOTE — Progress Notes (Signed)
Subjective:   Patient ID: Monica Whitaker, female    DOB: 1945/08/04, 69 y.o.   MRN: [id number]  Monica Whitaker is a pleasant 69 y.o. year old female who presents to clinic today with Wrist Injury  on 11/12/2014  HPI:  [place] in yoga class and broke her right wrist (unfortunately right hand dominant). S/p closed reduction and casting last Wednesday.  Unfortunately, needs another surgery and referred to Dr. Amedeo Plenty.  Has appointment to see him on Wednesday.  She is here to see me today for "mental health."  She is very frustrated.  Does not like just waiting for her surgery.  She is in pain- taking oxycodone as prescribed by ortho.  It does seem to help with the pain if she takes it as directed and stays in front of it.   Returned to [facility] on 4/8 for severe headache. Notes reviewed. Head CT was negative.    Did go to weekend clinic this past weekend- saw Dr. Yong Channel, for headache follow up. Note reviewed.   He felt headaches were causing by pain and advised her not to get behind on her oxycodone rx.  Has had no further headaches.  Very tearful and having difficulty focusing today- took oxycodone 45 minutes ago.  She is worried because her husband wants her to do housework and she cannot.  She thinks he would understand if she explained this to him.  He is not violent- she is not concerned for her safety, but he is "not used to taking care of himself."  Current Outpatient Prescriptions on File Prior to Visit  Medication Sig Dispense Refill  . acetaminophen (TYLENOL) 500 MG tablet Take 500 mg by mouth every 6 (six) hours as needed. Take 2 tablets every 6 hours as needed for pain    . calcium carbonate (OS-CAL) 600 MG TABS Take 600 mg by mouth 2 (two) times daily with a meal.      . cholecalciferol (VITAMIN D) 1000 UNITS tablet Take 1,000 Units by mouth daily.      Marland Kitchen dextroamphetamine (DEXTROSTAT) 5 MG tablet Take 5 mg by mouth daily.     Marland Kitchen diltiazem (CARDIZEM CD) 180 MG 24 hr capsule Take 1  capsule (180 mg total) by mouth daily. 90 capsule 3  . docusate sodium (COLACE) 100 MG capsule Take 1 capsule (100 mg total) by mouth daily as needed for constipation. 30 capsule 0  . DULoxetine (CYMBALTA) 60 MG capsule Take 60 mg by mouth daily.    . flecainide (TAMBOCOR) 50 MG tablet Take 1.5 tablets (75 mg total) by mouth 2 (two) times daily. 270 tablet 3  . levothyroxine (SYNTHROID, LEVOTHROID) 25 MCG tablet Take 25 mcg by mouth daily before breakfast.    . Multiple Vitamin (MULTIVITAMIN) tablet Take 1 tablet by mouth daily.      . Oral Electrolytes (BUFFERED SALT PO) Take 1 capsule by mouth as needed.     . polyethylene glycol powder (GLYCOLAX/MIRALAX) powder Take 17 g by mouth daily as needed (constipation).    . terbinafine (LAMISIL) 250 MG tablet Take 1 tablet (250 mg total) by mouth daily. 30 tablet 1  . Timolol Maleate (TIMOPTIC OP) Place 1 drop into the left eye daily.     . vitamin E 400 UNIT capsule Take 400 Units by mouth daily.      Alveda Reasons 20 MG TABS tablet TAKE 1 TABLET BY MOUTH EVERY DAY 90 tablet 1   No current facility-administered medications on file prior  to visit.    Allergies  Allergen Reactions  . Darvon Other (See Comments)    Severe panic attacks  . Propoxyphene N-Acetaminophen Other (See Comments)    Severe panic attacks  . Levofloxacin Anxiety and Other (See Comments)    Causes panic attacks.    Past Medical History  Diagnosis Date  . Anxiety   . Depression   . CVA (cerebral vascular accident) 12/2007  . 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 Atrial fibrillation     paroxysmal, failed medical therapy with flecainide,  NSVT with tikosyn  . Thyroid disease   . Stroke   . Bruises easily     Past Surgical History  Procedure Laterality Date  . Vaginal hysterectomy    . Tonsillectomy    . Eye surgery    . Tee without cardioversion N/A 10/18/2012    Procedure: TRANSESOPHAGEAL ECHOCARDIOGRAM (TEE);  Surgeon: Lelon Perla, MD;  Location: [facility] ENDOSCOPY;  Service: Cardiovascular;  Laterality: N/A;  Pre-Ablation at 12pm  . Atrial fibrillation ablation  10/18/12    PVI by Dr Rayann Heman  . Atrial fibrillation ablation N/A 10/18/2012    Procedure: ATRIAL FIBRILLATION ABLATION;  Surgeon: Thompson Grayer, MD;  Location: [facility] CATH LAB;  Service: Cardiovascular;  Laterality: N/A;    Family History  Problem Relation Age of Onset  . Multiple sclerosis Father     History   Social History  . Marital Status: Married    Spouse Name: N/A  . Number of Children: 0  . Years of Education: N/A   Occupational History  . Retired    Social History Main Topics  . Smoking status: Never Smoker   . Smokeless tobacco: Never Used  . Alcohol Use: 0.5 oz/week    1 drink(s) per week     Comment: regular  . Drug Use: No  . Sexual Activity: Not on file   Other Topics Concern  . Not on file   Social History Narrative   Lives in [place] with her husband.  No children.  Former Psychologist, prison and probation services      Has a living will-    Would desire CPR   Would not want prolonged life support if futile.   The PMH, PSH, Social History, Family History, Medications, and allergies have been reviewed in [facility], and have been updated if relevant.   Review of Systems  Psychiatric/Behavioral: Positive for sleep disturbance and decreased concentration.  All other systems reviewed and are negative.      Objective:    BP 110/72 mmHg  Pulse 67  Temp(Src) 97.9 F (36.6 C) (Oral)  Resp 16  Wt 178 lb (80.74 kg)  SpO2 95%   Physical Exam  Constitutional: She is oriented to person, place, and time. She appears well-developed.  Tearful, difficulty focusing  HENT:  Head: Normocephalic.  Musculoskeletal:  Right arm in sling, cast visible with fingers swollen and bruised  Neurological: She is alert and oriented to person, place, and time. No cranial nerve deficit.  Skin: Skin is warm and dry.  Nursing note and vitals reviewed.           Assessment & Plan:   No diagnosis found. No Follow-up on file.

## 2014-11-13 NOTE — Telephone Encounter (Signed)
Med list updated

## 2014-11-14 ENCOUNTER — Encounter (HOSPITAL_BASED_OUTPATIENT_CLINIC_OR_DEPARTMENT_OTHER): Payer: Self-pay | Admitting: *Deleted

## 2014-11-14 DIAGNOSIS — S52571A Other intraarticular fracture of lower end of right radius, initial encounter for closed fracture: Secondary | ICD-10-CM | POA: Diagnosis not present

## 2014-11-14 NOTE — Progress Notes (Signed)
Pt has hx af-stopped for surgery-will need istat in am-to bring all meds and overnight bag in case she has to stay

## 2014-11-15 ENCOUNTER — Ambulatory Visit (HOSPITAL_BASED_OUTPATIENT_CLINIC_OR_DEPARTMENT_OTHER): Payer: Medicare Other | Admitting: Certified Registered"

## 2014-11-15 ENCOUNTER — Ambulatory Visit (HOSPITAL_BASED_OUTPATIENT_CLINIC_OR_DEPARTMENT_OTHER)
Admission: RE | Admit: 2014-11-15 | Discharge: 2014-11-15 | Disposition: A | Discharge status: Home / Self Care | Payer: Medicare Other | Source: Ambulatory Visit | Attending: Orthopedic Surgery | Admitting: Orthopedic Surgery

## 2014-11-15 ENCOUNTER — Other Ambulatory Visit: Payer: Self-pay | Admitting: Family Medicine

## 2014-11-15 ENCOUNTER — Encounter (HOSPITAL_BASED_OUTPATIENT_CLINIC_OR_DEPARTMENT_OTHER)
Admission: RE | Disposition: A | Discharge status: Home / Self Care | Payer: Self-pay | Source: Ambulatory Visit | Attending: Orthopedic Surgery

## 2014-11-15 ENCOUNTER — Other Ambulatory Visit: Payer: Self-pay | Admitting: Orthopedic Surgery

## 2014-11-15 ENCOUNTER — Encounter (HOSPITAL_BASED_OUTPATIENT_CLINIC_OR_DEPARTMENT_OTHER): Payer: Self-pay | Admitting: *Deleted

## 2014-11-15 DIAGNOSIS — Y939 Activity, unspecified: Secondary | ICD-10-CM | POA: Insufficient documentation

## 2014-11-15 DIAGNOSIS — Y929 Unspecified place or not applicable: Secondary | ICD-10-CM | POA: Diagnosis not present

## 2014-11-15 DIAGNOSIS — I480 Paroxysmal atrial fibrillation: Secondary | ICD-10-CM | POA: Insufficient documentation

## 2014-11-15 DIAGNOSIS — F419 Anxiety disorder, unspecified: Secondary | ICD-10-CM | POA: Diagnosis not present

## 2014-11-15 DIAGNOSIS — S52501A Unspecified fracture of the lower end of right radius, initial encounter for closed fracture: Secondary | ICD-10-CM | POA: Insufficient documentation

## 2014-11-15 DIAGNOSIS — E039 Hypothyroidism, unspecified: Secondary | ICD-10-CM | POA: Diagnosis not present

## 2014-11-15 DIAGNOSIS — Z79891 Long term (current) use of opiate analgesic: Secondary | ICD-10-CM | POA: Diagnosis not present

## 2014-11-15 DIAGNOSIS — Y999 Unspecified external cause status: Secondary | ICD-10-CM | POA: Diagnosis not present

## 2014-11-15 DIAGNOSIS — Z7901 Long term (current) use of anticoagulants: Secondary | ICD-10-CM | POA: Diagnosis not present

## 2014-11-15 DIAGNOSIS — S52571A Other intraarticular fracture of lower end of right radius, initial encounter for closed fracture: Secondary | ICD-10-CM | POA: Diagnosis not present

## 2014-11-15 DIAGNOSIS — X58XXXA Exposure to other specified factors, initial encounter: Secondary | ICD-10-CM | POA: Diagnosis not present

## 2014-11-15 DIAGNOSIS — Z8673 Personal history of transient ischemic attack (TIA), and cerebral infarction without residual deficits: Secondary | ICD-10-CM | POA: Insufficient documentation

## 2014-11-15 DIAGNOSIS — F329 Major depressive disorder, single episode, unspecified: Secondary | ICD-10-CM | POA: Insufficient documentation

## 2014-11-15 DIAGNOSIS — G8918 Other acute postprocedural pain: Secondary | ICD-10-CM | POA: Diagnosis not present

## 2014-11-15 DIAGNOSIS — Z79899 Other long term (current) drug therapy: Secondary | ICD-10-CM | POA: Insufficient documentation

## 2014-11-15 DIAGNOSIS — I739 Peripheral vascular disease, unspecified: Secondary | ICD-10-CM | POA: Diagnosis not present

## 2014-11-15 DIAGNOSIS — S62101A Fracture of unspecified carpal bone, right wrist, initial encounter for closed fracture: Secondary | ICD-10-CM | POA: Diagnosis present

## 2014-11-15 DIAGNOSIS — M79601 Pain in right arm: Secondary | ICD-10-CM | POA: Diagnosis not present

## 2014-11-15 HISTORY — PX: OPEN REDUCTION INTERNAL FIXATION (ORIF) DISTAL RADIAL FRACTURE: SHX5989

## 2014-11-15 LAB — POCT I-STAT, CHEM 8
BUN: 17 mg/dL (ref 6–23)
CHLORIDE: 98 mmol/L (ref 96–112)
Calcium, Ion: 1.17 mmol/L (ref 1.13–1.30)
Creatinine, Ser: 0.6 mg/dL (ref 0.50–1.10)
GLUCOSE: 93 mg/dL (ref 70–99)
HCT: 47 % — ABNORMAL HIGH (ref 36.0–46.0)
Hemoglobin: 16 g/dL — ABNORMAL HIGH (ref 12.0–15.0)
POTASSIUM: 3.7 mmol/L (ref 3.5–5.1)
SODIUM: 138 mmol/L (ref 135–145)
TCO2: 25 mmol/L (ref 0–100)

## 2014-11-15 SURGERY — OPEN REDUCTION INTERNAL FIXATION (ORIF) DISTAL RADIUS FRACTURE
Anesthesia: Regional | Site: Wrist | Laterality: Right

## 2014-11-15 MED ORDER — PROPOFOL 10 MG/ML IV BOLUS
INTRAVENOUS | Status: DC | PRN
  Administered 2014-11-15: 100 mg via INTRAVENOUS

## 2014-11-15 MED ORDER — CEFAZOLIN SODIUM-DEXTROSE 2-3 GM-% IV SOLR
INTRAVENOUS | Status: AC
  Filled 2014-11-15: qty 50

## 2014-11-15 MED ORDER — MIDAZOLAM HCL 2 MG/2ML IJ SOLN
INTRAMUSCULAR | Status: AC
  Filled 2014-11-15: qty 2

## 2014-11-15 MED ORDER — PHENYLEPHRINE HCL 10 MG/ML IJ SOLN
INTRAMUSCULAR | Status: DC | PRN
  Administered 2014-11-15 (×2): 40 ug via INTRAVENOUS

## 2014-11-15 MED ORDER — EPHEDRINE SULFATE 50 MG/ML IJ SOLN
INTRAMUSCULAR | Status: DC | PRN
  Administered 2014-11-15 (×6): 10 mg via INTRAVENOUS

## 2014-11-15 MED ORDER — LIDOCAINE HCL (CARDIAC) 20 MG/ML IV SOLN
INTRAVENOUS | Status: DC | PRN
  Administered 2014-11-15: 20 mg via INTRAVENOUS

## 2014-11-15 MED ORDER — 0.9 % SODIUM CHLORIDE (POUR BTL) OPTIME
TOPICAL | Status: DC | PRN
  Administered 2014-11-15: 250 mL

## 2014-11-15 MED ORDER — OXYCODONE HCL 5 MG PO TABS
5.0000 mg | ORAL_TABLET | ORAL | Status: DC | PRN
Start: 2014-11-15 — End: 2015-03-19

## 2014-11-15 MED ORDER — ROPIVACAINE HCL 5 MG/ML IJ SOLN
INTRAMUSCULAR | Status: DC | PRN
  Administered 2014-11-15: 30 mL via PERINEURAL

## 2014-11-15 MED ORDER — ONDANSETRON HCL 4 MG/2ML IJ SOLN
INTRAMUSCULAR | Status: DC | PRN
  Administered 2014-11-15: 4 mg via INTRAVENOUS

## 2014-11-15 MED ORDER — CHLORHEXIDINE GLUCONATE 4 % EX LIQD: 60.0000 mL | Freq: Once | CUTANEOUS | Status: DC

## 2014-11-15 MED ORDER — MIDAZOLAM HCL 2 MG/2ML IJ SOLN
1.0000 mg | INTRAMUSCULAR | Status: DC | PRN
  Administered 2014-11-15: 2 mg via INTRAVENOUS

## 2014-11-15 MED ORDER — FENTANYL CITRATE 0.05 MG/ML IJ SOLN: 25.0000 ug | INTRAMUSCULAR | Status: DC | PRN

## 2014-11-15 MED ORDER — METHOCARBAMOL 500 MG PO TABS: 500.0000 mg | ORAL_TABLET | Freq: Four times a day (QID) | ORAL | Status: DC

## 2014-11-15 MED ORDER — DEXAMETHASONE SODIUM PHOSPHATE 10 MG/ML IJ SOLN
INTRAMUSCULAR | Status: DC | PRN
  Administered 2014-11-15: 4 mg via INTRAVENOUS

## 2014-11-15 MED ORDER — FENTANYL CITRATE 0.05 MG/ML IJ SOLN
INTRAMUSCULAR | Status: AC
  Filled 2014-11-15: qty 2

## 2014-11-15 MED ORDER — CEPHALEXIN 500 MG PO CAPS: 500.0000 mg | ORAL_CAPSULE | Freq: Four times a day (QID) | ORAL | Status: DC

## 2014-11-15 MED ORDER — CEFAZOLIN SODIUM-DEXTROSE 2-3 GM-% IV SOLR
2.0000 g | INTRAVENOUS | Status: AC
  Administered 2014-11-15: 2 g via INTRAVENOUS

## 2014-11-15 MED ORDER — FENTANYL CITRATE 0.05 MG/ML IJ SOLN
50.0000 ug | INTRAMUSCULAR | Status: DC | PRN
  Administered 2014-11-15: 100 ug via INTRAVENOUS

## 2014-11-15 MED ORDER — LACTATED RINGERS IV SOLN
INTRAVENOUS | Status: DC
  Administered 2014-11-15 (×2): via INTRAVENOUS

## 2014-11-15 SURGICAL SUPPLY — 83 items
BANDAGE ELASTIC 3 VELCRO ST LF (GAUZE/BANDAGES/DRESSINGS) ×3 IMPLANT
BANDAGE ELASTIC 4 VELCRO ST LF (GAUZE/BANDAGES/DRESSINGS) ×3 IMPLANT
BIT DRILL 2.2 SS TIBIAL (BIT) ×3 IMPLANT
BLADE CLIPPER SURG (BLADE) IMPLANT
BLADE SURG 15 STRL LF DISP TIS (BLADE) ×2 IMPLANT
BLADE SURG 15 STRL SS (BLADE) ×4
BNDG CONFORM 3 STRL LF (GAUZE/BANDAGES/DRESSINGS) ×3 IMPLANT
BNDG GAUZE ELAST 4 BULKY (GAUZE/BANDAGES/DRESSINGS) ×3 IMPLANT
BRUSH SCRUB EZ PLAIN DRY (MISCELLANEOUS) ×3 IMPLANT
CANISTER SUCT 1200ML W/VALVE (MISCELLANEOUS) ×3 IMPLANT
CORDS BIPOLAR (ELECTRODE) ×3 IMPLANT
COVER BACK TABLE 60X90IN (DRAPES) ×3 IMPLANT
COVER MAYO STAND STRL (DRAPES) ×3 IMPLANT
CUFF TOURNIQUET SINGLE 18IN (TOURNIQUET CUFF) ×3 IMPLANT
DECANTER SPIKE VIAL GLASS SM (MISCELLANEOUS) IMPLANT
DRAPE EXTREMITY T 121X128X90 (DRAPE) ×3 IMPLANT
DRAPE OEC MINIVIEW 54X84 (DRAPES) ×3 IMPLANT
DRAPE SURG 17X23 STRL (DRAPES) ×3 IMPLANT
DRSG ADAPTIC 3X8 NADH LF (GAUZE/BANDAGES/DRESSINGS) ×3 IMPLANT
DRSG EMULSION OIL 3X3 NADH (GAUZE/BANDAGES/DRESSINGS) ×3 IMPLANT
GAUZE SPONGE 4X4 12PLY STRL (GAUZE/BANDAGES/DRESSINGS) ×3 IMPLANT
GAUZE SPONGE 4X4 16PLY XRAY LF (GAUZE/BANDAGES/DRESSINGS) IMPLANT
GAUZE XEROFORM 5X9 LF (GAUZE/BANDAGES/DRESSINGS) ×3 IMPLANT
GLOVE BIOGEL M STRL SZ7.5 (GLOVE) ×3 IMPLANT
GLOVE BIOGEL PI IND STRL 7.0 (GLOVE) ×1 IMPLANT
GLOVE BIOGEL PI IND STRL 8 (GLOVE) ×1 IMPLANT
GLOVE BIOGEL PI INDICATOR 7.0 (GLOVE) ×2
GLOVE BIOGEL PI INDICATOR 8 (GLOVE) ×2
GLOVE ECLIPSE 6.5 STRL STRAW (GLOVE) ×3 IMPLANT
GLOVE EXAM NITRILE MD LF STRL (GLOVE) ×3 IMPLANT
GLOVE SS BIOGEL STRL SZ 8 (GLOVE) ×1 IMPLANT
GLOVE SUPERSENSE BIOGEL SZ 8 (GLOVE) ×2
GOWN STRL REUS W/ TWL LRG LVL3 (GOWN DISPOSABLE) ×1 IMPLANT
GOWN STRL REUS W/ TWL XL LVL3 (GOWN DISPOSABLE) ×2 IMPLANT
GOWN STRL REUS W/TWL LRG LVL3 (GOWN DISPOSABLE) ×2
GOWN STRL REUS W/TWL XL LVL3 (GOWN DISPOSABLE) ×4
NEEDLE HYPO 22GX1.5 SAFETY (NEEDLE) IMPLANT
NEEDLE HYPO 25X1 1.5 SAFETY (NEEDLE) IMPLANT
NS IRRIG 1000ML POUR BTL (IV SOLUTION) ×3 IMPLANT
PACK BASIN DAY SURGERY FS (CUSTOM PROCEDURE TRAY) ×3 IMPLANT
PAD CAST 3X4 CTTN HI CHSV (CAST SUPPLIES) ×2 IMPLANT
PAD CAST 4YDX4 CTTN HI CHSV (CAST SUPPLIES) ×1 IMPLANT
PADDING CAST ABS 3INX4YD NS (CAST SUPPLIES) ×2
PADDING CAST ABS 4INX4YD NS (CAST SUPPLIES) ×2
PADDING CAST ABS COTTON 3X4 (CAST SUPPLIES) ×1 IMPLANT
PADDING CAST ABS COTTON 4X4 ST (CAST SUPPLIES) ×1 IMPLANT
PADDING CAST COTTON 3X4 STRL (CAST SUPPLIES) ×4
PADDING CAST COTTON 4X4 STRL (CAST SUPPLIES) ×2
PEG LOCKING SMOOTH 2.2X18 (Peg) ×9 IMPLANT
PEG LOCKING SMOOTH 2.2X20 (Screw) ×6 IMPLANT
PLATE NARROW DVR RIGHT (Plate) ×3 IMPLANT
PUTTY DBM STAGRAFT 5CC (Putty) ×3 IMPLANT
SCREW LOCK 14X2.7X 3 LD TPR (Screw) ×1 IMPLANT
SCREW LOCK 16X2.7X 3 LD TPR (Screw) ×1 IMPLANT
SCREW LOCKING 2.7X13MM (Screw) ×3 IMPLANT
SCREW LOCKING 2.7X14 (Screw) ×2 IMPLANT
SCREW LOCKING 2.7X15MM (Screw) ×6 IMPLANT
SCREW LOCKING 2.7X16 (Screw) ×2 IMPLANT
SCREW MULTI DIRECTIONAL 2.7X18 (Screw) ×3 IMPLANT
SHEET MEDIUM DRAPE 40X70 STRL (DRAPES) IMPLANT
SLEEVE SCD COMPRESS KNEE MED (MISCELLANEOUS) ×3 IMPLANT
SPLINT FIBERGLASS 3X35 (CAST SUPPLIES) IMPLANT
SPLINT FIBERGLASS 4X30 (CAST SUPPLIES) ×3 IMPLANT
SPLINT PLASTER CAST XFAST 3X15 (CAST SUPPLIES) IMPLANT
SPLINT PLASTER XTRA FASTSET 3X (CAST SUPPLIES)
STOCKINETTE 4X48 STRL (DRAPES) ×3 IMPLANT
STOCKINETTE SYNTHETIC 3 UNSTER (CAST SUPPLIES) ×3 IMPLANT
SUCTION FRAZIER TIP 10 FR DISP (SUCTIONS) ×3 IMPLANT
SUT ETHILON 4 0 PS 2 18 (SUTURE) IMPLANT
SUT PROLENE 4 0 PS 2 18 (SUTURE) ×6 IMPLANT
SUT VIC AB 3-0 FS2 27 (SUTURE) ×3 IMPLANT
SYR BULB 3OZ (MISCELLANEOUS) ×3 IMPLANT
SYR CONTROL 10ML LL (SYRINGE) IMPLANT
SYSTEM CHEST DRAIN TLS 7FR (DRAIN) ×3 IMPLANT
TAPE SURG TRANSPORE 1 IN (GAUZE/BANDAGES/DRESSINGS) ×1 IMPLANT
TAPE SURGICAL TRANSPORE 1 IN (GAUZE/BANDAGES/DRESSINGS) ×2
TOWEL OR 17X24 6PK STRL BLUE (TOWEL DISPOSABLE) ×6 IMPLANT
TOWEL OR NON WOVEN STRL DISP B (DISPOSABLE) ×3 IMPLANT
TRAP DIGIT (INSTRUMENTS) ×3 IMPLANT
TRAP FINGER LRG (INSTRUMENTS) IMPLANT
TUBE CONNECTING 20'X1/4 (TUBING) ×1
TUBE CONNECTING 20X1/4 (TUBING) ×2 IMPLANT
UNDERPAD 30X30 INCONTINENT (UNDERPADS AND DIAPERS) ×3 IMPLANT

## 2014-11-15 NOTE — H&P (Signed)
Monica Whitaker is an 69 y.o. female.   Chief Complaint: right wrist fracture [id number] ORIF right wrist fracture Patient presents for evaluation and treatment of the of their upper extremity predicament. The patient denies neck, back, chest or  abdominal pain. The patient notes that they have no lower extremity problems. The patients primary complaint is noted. We are planning surgical care pathway for the upper extremity.  Past Medical History  Diagnosis Date  . Anxiety   . Depression   . CVA (cerebral vascular accident) 12/2007  . 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 Atrial fibrillation     paroxysmal, failed medical therapy with flecainide,  NSVT with tikosyn  . Thyroid disease   . Stroke   . Bruises easily     Past Surgical History  Procedure Laterality Date  . Vaginal hysterectomy    . Tonsillectomy    . Eye surgery    . Tee without cardioversion N/A 10/18/2012    Procedure: TRANSESOPHAGEAL ECHOCARDIOGRAM (TEE);  Surgeon: Lelon Perla, MD;  Location: [facility] ENDOSCOPY;  Service: Cardiovascular;  Laterality: N/A;  Pre-Ablation at 12pm  . Atrial fibrillation ablation  10/18/12    PVI by Dr Rayann Heman  . Atrial fibrillation ablation N/A 10/18/2012    Procedure: ATRIAL FIBRILLATION ABLATION;  Surgeon: Thompson Grayer, MD;  Location: [facility] CATH LAB;  Service: Cardiovascular;  Laterality: N/A;    Family History  Problem Relation Age of Onset  . Multiple sclerosis Father    Social History:  reports that she has never smoked. She has never used smokeless tobacco. She reports that she drinks about 0.5 oz of alcohol per week. She reports that she does not use illicit drugs.  Allergies:  Allergies  Allergen Reactions  . Darvon Other (See Comments)    Severe panic attacks  . Propoxyphene N-Acetaminophen Other (See Comments)    Severe panic attacks  . Levofloxacin Anxiety and Other (See Comments)    Causes panic attacks.    Medications Prior to Admission   Medication Sig Dispense Refill  . acetaminophen (TYLENOL) 500 MG tablet Take 500 mg by mouth every 6 (six) hours as needed. Take 2 tablets every 6 hours as needed for pain    . calcium carbonate (OS-CAL) 600 MG TABS Take 600 mg by mouth 2 (two) times daily with a meal.      . cholecalciferol (VITAMIN D) 1000 UNITS tablet Take 1,000 Units by mouth daily.      Marland Kitchen dextroamphetamine (DEXTROSTAT) 5 MG tablet Take 5 mg by mouth daily.     Marland Kitchen diltiazem (CARDIZEM CD) 180 MG 24 hr capsule Take 1 capsule (180 mg total) by mouth daily. 90 capsule 3  . DULoxetine (CYMBALTA) 60 MG capsule Take 60 mg by mouth daily.    . flecainide (TAMBOCOR) 50 MG tablet Take 1.5 tablets (75 mg total) by mouth 2 (two) times daily. 270 tablet 3  . levothyroxine (SYNTHROID, LEVOTHROID) 25 MCG tablet Take 25 mcg by mouth daily before breakfast.    . Multiple Vitamin (MULTIVITAMIN) tablet Take 1 tablet by mouth daily.      . Oral Electrolytes (BUFFERED SALT PO) Take 1 capsule by mouth as needed.     . OxyCODONE HCl, Abuse Deter, 5 MG TABA Take 5 mg by mouth. Take one to two tablets every four to six hours as needed.    . Timolol Maleate (TIMOPTIC OP) Place 1 drop into the left eye daily.     Marland Kitchen  vitamin E 400 UNIT capsule Take 400 Units by mouth daily.      Alveda Reasons 20 MG TABS tablet TAKE 1 TABLET BY MOUTH EVERY DAY 90 tablet 1  . docusate sodium (COLACE) 100 MG capsule Take 1 capsule (100 mg total) by mouth daily as needed for constipation. 30 capsule 0  . polyethylene glycol powder (GLYCOLAX/MIRALAX) powder Take 17 g by mouth daily as needed (constipation).      Results for orders placed or performed during the hospital encounter of 11/15/14 (from the past 48 hour(s))  I-STAT, chem 8     Status: Abnormal   Collection Time: 11/15/14  1:10 PM  Result Value Ref Range   Sodium 138 135 - 145 mmol/L   Potassium 3.7 3.5 - 5.1 mmol/L   Chloride 98 96 - 112 mmol/L   BUN 17 6 - 23 mg/dL   Creatinine, Ser 0.60 0.50 - 1.10 mg/dL    Glucose, Bld 93 70 - 99 mg/dL   Calcium, Ion 1.17 1.13 - 1.30 mmol/L   TCO2 25 0 - 100 mmol/L   Hemoglobin 16.0 (H) 12.0 - 15.0 g/dL   HCT 47.0 (H) 36.0 - 46.0 %   No results found.  Review of Systems  Eyes: Negative.   Cardiovascular: Negative.   Gastrointestinal: Negative.   Genitourinary: Negative.   Neurological: Negative.     Blood pressure 113/54, pulse 60, temperature 98.1 F (36.7 C), temperature source Oral, resp. rate 16, height 5\' 10"  (1.778 m), weight 78.699 kg (173 lb 8 oz), SpO2 98 %. Physical Exam displaced right wrist fracture with pain and deformity,NVI The patient is alert and oriented in no acute distress. The patient complains of pain in the affected upper extremity.  The patient is noted to have a normal HEENT exam. Lung fields show equal chest expansion and no shortness of breath. Abdomen exam is nontender without distention. Lower extremity examination does not show any fracture dislocation or blood clot symptoms. Pelvis is stable and the neck and back are stable and nontender. Assessment/Plan We are planning surgery for your upper extremity. The risk and benefits of surgery to include risk of bleeding, infection, anesthesia,  damage to normal structures and failure of the surgery to accomplish its intended goals of relieving symptoms and restoring function have been discussed in detail. With this in mind we plan to proceed. I have specifically discussed with the patient the pre-and postoperative regime and the dos and don'ts and risk and benefits in great detail. Risk and benefits of surgery also include risk of dystrophy(CRPS), chronic nerve pain, failure of the healing process to go onto completion and other inherent risks of surgery The relavent the pathophysiology of the disease/injury process, as well as the alternatives for treatment and postoperative course of action has been discussed in great detail with the patient who desires to proceed.  We will do  everything in our power to help you (the patient) restore function to the upper extremity. It is a pleasure to see this patient today.  Paulene Floor 11/15/2014, 2:39 PM

## 2014-11-15 NOTE — Telephone Encounter (Signed)
This medication is not on pts current medication list. pls advise

## 2014-11-15 NOTE — Anesthesia Procedure Notes (Addendum)
Anesthesia Regional Block:  Supraclavicular block  Pre-Anesthetic Checklist: ,, timeout performed, Correct Patient, Correct Site, Correct Laterality, Correct Procedure, Correct Position, site marked, Risks and benefits discussed, pre-op evaluation, post-op pain management  Laterality: Right  Prep: Maximum Sterile Barrier Precautions used and chloraprep       Needles:  Injection technique: Single-shot  Needle Type: Echogenic Stimulator Needle     Needle Length: 5cm 5 cm Needle Gauge: 22 and 22 G    Additional Needles:  Procedures: ultrasound guided (picture in chart) Supraclavicular block Narrative:  Start time: 11/15/2014 1:41 PM End time: 11/15/2014 1:50 PM Injection made incrementally with aspirations every 5 mL. Anesthesiologist: Roderic Palau  Additional Notes: 2% Lidocaine skin wheel.    Procedure Name: LMA Insertion Date/Time: 11/15/2014 3:00 PM Performed by: Korrie Hofbauer D Pre-anesthesia Checklist: Patient identified, Emergency Drugs available, Suction available and Patient being monitored Patient Re-evaluated:Patient Re-evaluated prior to inductionOxygen Delivery Method: Circle System Utilized Preoxygenation: Pre-oxygenation with 100% oxygen Intubation Type: IV induction Ventilation: Mask ventilation without difficulty LMA: LMA inserted LMA Size: 4.0 Number of attempts: 1 Airway Equipment and Method: Bite block Placement Confirmation: positive ETCO2 Tube secured with: Tape Dental Injury: Teeth and Oropharynx as per pre-operative assessment

## 2014-11-15 NOTE — Progress Notes (Signed)
Assisted Dr. Edmond Fitzgerald with right, ultrasound guided, supraclavicular block. Side rails up, monitors on throughout procedure. See vital signs in flow sheet. Tolerated Procedure well. 

## 2014-11-15 NOTE — Anesthesia Postprocedure Evaluation (Signed)
  Anesthesia Post-op Note  Patient: Monica Whitaker  Procedure(s) Performed: Procedure(s): OPEN REDUCTION INTERNAL FIXATION (ORIF) RIGHT DISTAL RADIAL FRACTURE WITH ALLOGRAFT BONE GRAFT (Right)  Patient Location: PACU  Anesthesia Type:General and block  Level of Consciousness: awake and alert   Airway and Oxygen Therapy: Patient Spontanous Breathing  Post-op Pain: none  Post-op Assessment: Post-op Vital signs reviewed, Patient's Cardiovascular Status Stable and Respiratory Function Stable  Post-op Vital Signs: Reviewed  Filed Vitals:   11/15/14 1645  BP: 110/56  Pulse: 71  Temp:   Resp: 10    Complications: No apparent anesthesia complications

## 2014-11-15 NOTE — Op Note (Signed)
see dictation [phone] Mosie Epstein.D.

## 2014-11-15 NOTE — Discharge Instructions (Signed)
Keep bandage clean and dry.  Call for any problems.  No smoking.  Criteria for driving a car: you should be off your pain medicine for 7-8 hours, able to drive one handed(confident), thinking clearly and feeling able in your judgement to drive. Continue elevation as it will decrease swelling.  If instructed by MD move your fingers within the confines of the bandage/splint.  Use ice if instructed by your MD. Call immediately for any sudden loss of feeling in your hand/arm or change in functional abilities of the extremity.We recommend that you to take vitamin C 1000 mg a day to promote healing. We also recommend that if you require  pain medicine that you take a stool softener to prevent constipation as most pain medicines will have constipation side effects. We recommend either Peri-Colace or Senokot and recommend that you also consider adding MiraLAX to prevent the constipation affects from pain medicine if you are required to use them. These medicines are over the counter and maybe purchased at a local pharmacy. A cup of yogurt and a probiotic can also be helpful during the recovery process as the medicines can disrupt your intestinal environment.  Post Anesthesia Home Care Instructions  Activity: Get plenty of rest for the remainder of the day. A responsible adult should stay with you for 24 hours following the procedure.  For the next 24 hours, DO NOT: -Drive a car -Paediatric nurse -Drink alcoholic beverages -Take any medication unless instructed by your physician -Make any legal decisions or sign important papers.  Meals: Start with liquid foods such as gelatin or soup. Progress to regular foods as tolerated. Avoid greasy, spicy, heavy foods. If nausea and/or vomiting occur, drink only clear liquids until the nausea and/or vomiting subsides. Call your physician if vomiting continues.  Special Instructions/Symptoms: Your throat may feel dry or sore from the anesthesia or the breathing tube  placed in your throat during surgery. If this causes discomfort, gargle with warm salt water. The discomfort should disappear within 24 hours.  If you had a scopolamine patch placed behind your ear for the management of post- operative nausea and/or vomiting:  1. The medication in the patch is effective for 72 hours, after which it should be removed.  Wrap patch in a tissue and discard in the trash. Wash hands thoroughly with soap and water. 2. You may remove the patch earlier than 72 hours if you experience unpleasant side effects which may include dry mouth, dizziness or visual disturbances. 3. Avoid touching the patch. Wash your hands with soap and water after contact with the patch.    Regional Anesthesia Blocks  1. Numbness or the inability to move the "blocked" extremity may last from 3-48 hours after placement. The length of time depends on the medication injected and your individual response to the medication. If the numbness is not going away after 48 hours, call your surgeon.  2. The extremity that is blocked will need to be protected until the numbness is gone and the  Strength has returned. Because you cannot feel it, you will need to take extra care to avoid injury. Because it may be weak, you may have difficulty moving it or using it. You may not know what position it is in without looking at it while the block is in effect.  3. For blocks in the legs and feet, returning to weight bearing and walking needs to be done carefully. You will need to wait until the numbness is entirely gone and the  strength has returned. You should be able to move your leg and foot normally before you try and bear weight or walk. You will need someone to be with you when you first try to ensure you do not fall and possibly risk injury.  4. Bruising and tenderness at the needle site are common side effects and will resolve in a few days.  5. Persistent numbness or new problems with movement should be  communicated to the surgeon or the [facility] [phone] [place] [phone]).   TLS Drain Instructions You have a drain tube in place to help limit the amount of blood that collects under your skin in the early post-operative period.  The amount it drains will vary from person-to-person and is dependent upon many factors.  You will have 1-2 extra drain tubes sent with you from the facility.  You should change the tube according to these instructions below when the tube is about half full.  BE SURE TO SLIDE THE CLAMP TO THE CLOSED POSITION BEFORE CHANGING THE TUBE.    RE-OPEN THE CLAMP ONCE THE GLASS EVACUATION TUBE HAS BEEN CHANGED.  24 hours after surgery the amount of drainage will likely be negligible and it will be time to remove the tube and discard it.  Simply remove the tape that secures the flexible plastic drainage tube to the bandage and briskly pull the tube straight out.  You will likely feel a little discomfort during this process, but the tube should slide out as it is not sewn or otherwise secured directly to your body.  It is merely held in place by the bandage itself.                              If you are not clear about when your first post-op appointment is scheduled, please call the office on the next business day to inquire about it.  Please also call the office if you have any other questions [phone]).      Change red top tube at 10pm tonight.  Pull out drainage tube tomorrow morning. 11/16/14

## 2014-11-15 NOTE — Transfer of Care (Signed)
Immediate Anesthesia Transfer of Care Note  Patient: Monica Whitaker  Procedure(s) Performed: Procedure(s): OPEN REDUCTION INTERNAL FIXATION (ORIF) RIGHT DISTAL RADIAL FRACTURE WITH ALLOGRAFT BONE GRAFT (Right)  Patient Location: PACU  Anesthesia Type:GA combined with regional for post-op pain  Level of Consciousness: awake and patient cooperative  Airway & Oxygen Therapy: Patient Spontanous Breathing and Patient connected to face mask oxygen  Post-op Assessment: Report given to RN and Post -op Vital signs reviewed and stable  Post vital signs: Reviewed and stable  Last Vitals:  Filed Vitals:   11/15/14 1430  BP: 113/54  Pulse: 60  Temp:   Resp: 16    Complications: No apparent anesthesia complications

## 2014-11-15 NOTE — Anesthesia Preprocedure Evaluation (Addendum)
Anesthesia Evaluation  Patient identified by MRN, date of birth, ID band Patient awake    Reviewed: Allergy & Precautions, H&P , NPO status , Patient's Chart, lab work & pertinent test results  Airway Mallampati: III  TM Distance: >3 FB Neck ROM: Full    Dental no notable dental hx. (+) Teeth Intact, Dental Advisory Given   Pulmonary neg pulmonary ROS,  breath sounds clear to auscultation  Pulmonary exam normal       Cardiovascular + Peripheral Vascular Disease + dysrhythmias Atrial Fibrillation Rhythm:Regular Rate:Normal     Neuro/Psych Anxiety Depression CVA    GI/Hepatic negative GI ROS, Neg liver ROS,   Endo/Other  Hypothyroidism   Renal/GU negative Renal ROS  negative genitourinary   Musculoskeletal   Abdominal   Peds  Hematology negative hematology ROS (+)   Anesthesia Other Findings   Reproductive/Obstetrics negative OB ROS                            Anesthesia Physical Anesthesia Plan  ASA: III  Anesthesia Plan: General and Regional   Post-op Pain Management:    Induction: Intravenous  Airway Management Planned: LMA  Additional Equipment:   Intra-op Plan:   Post-operative Plan: Extubation in OR  Informed Consent: I have reviewed the patients History and Physical, chart, labs and discussed the procedure including the risks, benefits and alternatives for the proposed anesthesia with the patient or authorized representative who has indicated his/her understanding and acceptance.   Dental advisory given  Plan Discussed with: CRNA  Anesthesia Plan Comments:         Anesthesia Quick Evaluation

## 2014-11-16 ENCOUNTER — Encounter (HOSPITAL_BASED_OUTPATIENT_CLINIC_OR_DEPARTMENT_OTHER): Payer: Self-pay | Admitting: Orthopedic Surgery

## 2014-11-16 NOTE — Op Note (Signed)
Monica Whitaker, Monica Whitaker               ACCOUNT NO.:  [id number]  MEDICAL RECORD NO.:  [id number]  LOCATION:  DAY                            FACILITY:  PHYSICIAN:  Satira Anis. Shylie Polo, M.D.DATE OF BIRTH:  1946-01-12  DATE OF PROCEDURE:  11/15/2014 DATE OF DISCHARGE:  11/15/2014                              OPERATIVE REPORT   PREOPERATIVE DIAGNOSES:  Comminuted complex distal radius fracture with progressive angulatory collapse, right wrist.  POSTOPERATIVE DIAGNOSIS:  Comminuted complex distal radius fracture with progressive angulatory collapse, right wrist.  PROCEDURE: 1. Open reduction and internal fixation of right wrist fracture with     DVR plate and screw construct and allograft, bone graft in the form     of stay graft from [employer] approximately 4 mL.  This was used     secondary to extreme dorsal comminution. 2. AP, lateral, and oblique stress x-rays performed, examined, and     interpreted by myself, right wrist. 3. Brachioradialis tenotomy, right wrist.  SURGEON:  Satira Anis. Amedeo Plenty, M.D.  ASSISTANT:  None.  COMPLICATIONS:  None.  ANESTHESIA:  General with preoperative block.  TOURNIQUET TIME:  Less than an hour.  DRAINS:  one.  INDICATIONS:  The patient is a pleasant female presents with the above- mentioned diagnosis.  She was referred from [facility] due to the complexity of her fracture and its progressive angulation and loss of contours.  I counseled her in regard to risks and benefits, and she desires to proceed.  I have counseled her in regard to her blood thinners and other issues. At present time, she would like proceed with surgical algorithm of care. I have discussed her care with Dr. Rayann Heman who concurs with moving toward and holding the Xarelto blood thinner.  OPERATION IN DETAIL:  The patient was seen by myself and Anesthesia, taken to the operative suite, underwent a smooth induction of general anesthesia.  Preoperatively, a block was placed.  She  was prepped and draped in usual sterile fashion with Betadine scrub paint by myself.  Once this was complete, the arm was elevated, tourniquet was then insufflated to 250 mmHg and a volar radial incision was made about the wrist.  Dissection was carried down.  The FCR tendon sheath was incised palmarly and dorsally.  Fasciotomy accomplished without issue.  The FCR was retracted as were the carpal canal contents and I then approached the wrist with an incision along the pronator. Elevation of the pronator occurred followed by exposure of the fracture. I performed a brachioradialis splitting tenotomy and following this, I placed ample bone graft (4 mL of stay graft) into the dorsal V defect.  The patient tolerated this well.  There were no complicating features. Following this, the patient then underwent a reduction and placement of a narrow DVR plate.  This was a cross locking DVR plate.  This was performed without difficulty and allowed anatomic positioning and excellent position of the radius in terms of its height inclination and volar tilt.  Screw lengths were checked.  Following this, live fluoro was brought in and the radiocarpal, mid-carpal and distal radioulnar joints all looked well.  I was pleased with the findings.  Final AP lateral and  oblique x-rays were performed, examined, and interpreted by myself and deemed to be looking excellent.  Following this, she was irrigated, pronator was closed and the tourniquet was deflated of course.  Hemostasis was excellently maintained and there were no bleeding complications.  The patient tolerated this well.  Ultimately, the skin was closed after TLS drain size 7 was placed and the patient then had a sterile thumb spica splint applied.  The patient looked well in the recovery room.  We will watch her closely and see her back in the office in 12 days for standard DVR algorithm.  These notes have been discussed.  All questions have been  encouraged and answered.     Satira Anis. Amedeo Plenty, M.D.     [facility]  D:  11/15/2014  T:  11/15/2014  Job:  [id number]

## 2014-11-28 ENCOUNTER — Ambulatory Visit (INDEPENDENT_AMBULATORY_CARE_PROVIDER_SITE_OTHER): Payer: Medicare Other | Admitting: Family Medicine

## 2014-11-28 ENCOUNTER — Encounter: Payer: Self-pay | Admitting: Family Medicine

## 2014-11-28 VITALS — BP 110/62 | HR 67 | Temp 98.0°F | Wt 169.5 lb

## 2014-11-28 DIAGNOSIS — S62101S Fracture of unspecified carpal bone, right wrist, sequela: Secondary | ICD-10-CM | POA: Diagnosis not present

## 2014-11-28 DIAGNOSIS — R110 Nausea: Secondary | ICD-10-CM | POA: Diagnosis not present

## 2014-11-28 NOTE — Progress Notes (Signed)
Subjective:   Patient ID: Monica Whitaker, female    DOB: 1946/03/05, 69 y.o.   MRN: [id number]  Monica Whitaker is a pleasant 69 y.o. year old female who presents to clinic today with Nausea  on 11/28/2014  HPI:  Here because she is nauseated and tired of not being able to do things for herself independently. S/p right wrist fracture earlier this month, requiring two surgeries- most recent surgery by Dr. Amedeo Plenty on 11/15/14- ORIF with bone graft.  Since this surgery she has been nauseated and tearful.  Her psychiatrist has advised that she stop some of her medications while she is taking narcotics.  Starting PT this week.  Has appt with Dr. Amedeo Plenty tomorrow.  No vomiting or diarrhea.  Pain is "ok" when she takes her medications as prescribed.  She says she has a lot of questions about her recovery but is "afraid to ask Dr. Amedeo Plenty" so she is here today.  She feels he is very nice and good but does not "want to bother him."  Nothing tastes good- she has lost some weight. Wt Readings from Last 3 Encounters:  11/28/14 169 lb 8 oz (76.885 kg)  11/15/14 173 lb 8 oz (78.699 kg)  11/12/14 178 lb (80.74 kg)   Current Outpatient Prescriptions on File Prior to Visit  Medication Sig Dispense Refill  . acetaminophen (TYLENOL) 500 MG tablet Take 500 mg by mouth every 6 (six) hours as needed. Take 2 tablets every 6 hours as needed for pain    . calcium carbonate (OS-CAL) 600 MG TABS Take 600 mg by mouth 2 (two) times daily with a meal.      . cholecalciferol (VITAMIN D) 1000 UNITS tablet Take 1,000 Units by mouth daily.      Marland Kitchen dextroamphetamine (DEXTROSTAT) 5 MG tablet Take 5 mg by mouth daily.     Marland Kitchen diltiazem (CARDIZEM CD) 180 MG 24 hr capsule Take 1 capsule (180 mg total) by mouth daily. 90 capsule 3  . docusate sodium (COLACE) 100 MG capsule Take 1 capsule (100 mg total) by mouth daily as needed for constipation. 30 capsule 0  . DULoxetine (CYMBALTA) 60 MG capsule Take 60 mg by mouth daily.    .  flecainide (TAMBOCOR) 50 MG tablet Take 1.5 tablets (75 mg total) by mouth 2 (two) times daily. 270 tablet 3  . levothyroxine (SYNTHROID, LEVOTHROID) 25 MCG tablet TAKE 1 TABLET EVERY DAY 90 tablet 1  . methocarbamol (ROBAXIN) 500 MG tablet Take 1 tablet (500 mg total) by mouth 4 (four) times daily. 30 tablet 0  . Multiple Vitamin (MULTIVITAMIN) tablet Take 1 tablet by mouth daily.      . Oral Electrolytes (BUFFERED SALT PO) Take 1 capsule by mouth as needed.     Marland Kitchen oxyCODONE (OXY IR/ROXICODONE) 5 MG immediate release tablet Take 1 tablet (5 mg total) by mouth every 4 (four) hours as needed for severe pain. 50 tablet 0  . OxyCODONE HCl, Abuse Deter, 5 MG TABA Take 5 mg by mouth. Take one to two tablets every four to six hours as needed.    . polyethylene glycol powder (GLYCOLAX/MIRALAX) powder Take 17 g by mouth daily as needed (constipation).    . Timolol Maleate (TIMOPTIC OP) Place 1 drop into the left eye daily.     . vitamin E 400 UNIT capsule Take 400 Units by mouth daily.      Alveda Reasons 20 MG TABS tablet TAKE 1 TABLET BY MOUTH EVERY DAY 90  tablet 1   No current facility-administered medications on file prior to visit.    Allergies  Allergen Reactions  . Darvon Other (See Comments)    Severe panic attacks  . Propoxyphene N-Acetaminophen Other (See Comments)    Severe panic attacks  . Levofloxacin Anxiety and Other (See Comments)    Causes panic attacks.    Past Medical History  Diagnosis Date  . Anxiety   . Depression   . CVA (cerebral vascular accident) 12/2007  . Ventricular tachycardia     pt told at [facility] that she had RVOT VT  . Atrial fibrillation     paroxysmal, failed medical therapy with flecainide,  NSVT with tikosyn  . Thyroid disease   . Stroke   . Bruises easily     Past Surgical History  Procedure Laterality Date  . Vaginal hysterectomy    . Tonsillectomy    . Eye surgery    . Tee without cardioversion N/A 10/18/2012    Procedure: TRANSESOPHAGEAL  ECHOCARDIOGRAM (TEE);  Surgeon: Lelon Perla, MD;  Location: [facility] ENDOSCOPY;  Service: Cardiovascular;  Laterality: N/A;  Pre-Ablation at 12pm  . Atrial fibrillation ablation  10/18/12    PVI by Dr Rayann Heman  . Atrial fibrillation ablation N/A 10/18/2012    Procedure: ATRIAL FIBRILLATION ABLATION;  Surgeon: Thompson Grayer, MD;  Location: [facility] CATH LAB;  Service: Cardiovascular;  Laterality: N/A;  . Open reduction internal fixation (orif) distal radial fracture Right 11/15/2014    Procedure: OPEN REDUCTION INTERNAL FIXATION (ORIF) RIGHT DISTAL RADIAL FRACTURE WITH ALLOGRAFT BONE GRAFT;  Surgeon: Roseanne Kaufman, MD;  Location: [facility];  Service: Orthopedics;  Laterality: Right;    Family History  Problem Relation Age of Onset  . Multiple sclerosis Father     History   Social History  . Marital Status: Married    Spouse Name: N/A  . Number of Children: 0  . Years of Education: N/A   Occupational History  . Retired    Social History Main Topics  . Smoking status: Never Smoker   . Smokeless tobacco: Never Used  . Alcohol Use: 0.5 oz/week    1 drink(s) per week     Comment: regular  . Drug Use: No  . Sexual Activity: Not on file   Other Topics Concern  . Not on file   Social History Narrative   Lives in [place] with her husband.  No children.  Former Psychologist, prison and probation services      Has a living will-    Would desire CPR   Would not want prolonged life support if futile.   The PMH, PSH, Social History, Family History, Medications, and allergies have been reviewed in [facility], and have been updated if relevant.   Review of Systems  Constitutional: Positive for fatigue.  Gastrointestinal: Positive for nausea. Negative for vomiting, diarrhea and abdominal distention.  Psychiatric/Behavioral: The patient is nervous/anxious.   All other systems reviewed and are negative.      Objective:    BP 110/62 mmHg  Pulse 67  Temp(Src) 98 F (36.7 C) (Oral)  Wt 169 lb 8 oz (76.885  kg)  SpO2 97%   Physical Exam  Constitutional: She is oriented to person, place, and time. She appears well-developed and well-nourished. No distress.  HENT:  Head: Normocephalic.  Musculoskeletal:  Right arm in cast  Neurological: She is alert and oriented to person, place, and time.  Skin: Skin is warm.  Psychiatric:  tearful  Nursing note and vitals reviewed.  Assessment & Plan:   No diagnosis found. No Follow-up on file.

## 2014-11-28 NOTE — Progress Notes (Signed)
Pre visit review using our clinic review tool, if applicable. No additional management support is needed unless otherwise documented below in the visit note. 

## 2014-11-28 NOTE — Assessment & Plan Note (Signed)
>  25 minutes spent in face to face time with patient, >50% spent in counselling or coordination of care Looked through her medications with her- she has a bottle of phenergan and did not realize it.  I advise that she take this for nausea, which is most likely due to narcotics and pain. I also advised that she discuss all of her questions with Dr. Amedeo Plenty and that she would not be "bothering him."  Call or return to clinic prn if these symptoms worsen or fail to improve as anticipated. The patient indicates understanding of these issues and agrees with the plan.

## 2014-11-29 DIAGNOSIS — S52571D Other intraarticular fracture of lower end of right radius, subsequent encounter for closed fracture with routine healing: Secondary | ICD-10-CM | POA: Diagnosis not present

## 2014-11-29 DIAGNOSIS — Z4789 Encounter for other orthopedic aftercare: Secondary | ICD-10-CM | POA: Diagnosis not present

## 2014-12-02 NOTE — Op Note (Signed)
PATIENT NAME:  Monica Whitaker, Monica Whitaker MR#:  [id number] DATE OF BIRTH:  1945/11/27  DATE OF PROCEDURE:  11/07/2014  HISTORY OF PRESENT ILLNESS: Monica Whitaker is a 69 year old right-hand dominant female who fell while performing yoga. She landed on her right dominant arm having immediate pain and deformity. She was brought to the [facility] Emergency Department where she was x-rayed and diagnosed with a severely displaced distal radius fracture on the right side. There were no other injuries. The patient was sent to my office for further evaluation. She could not tolerate reduction in the office and requested the closed reduction be performed in the OR. I explained to the patient the risks and benefits of the procedure. She understands the risks may include loss of reduction and the need for open reduction internal fixation. Bruising, swelling, compartment syndrome, nerve or blood vessel injury are also possible risks to closed reduction and casting procedures.   PROCEDURE NOTE: The patient was brought to the operating room. She underwent general anesthesia. A timeout was performed to verify the patient's name, date of birth, medical record number, correct site of surgery, and correct procedure to be performed. It was also used to verify the patient had received antibiotics and all appropriate instruments, implants, and radiographic studies were available in the room. Once all in attendance were in agreement, the case began.   Monica Whitaker had prereduction x-rays taken with FluoroScan. Then a closed reduction was performed. It did not require much force to reduce the patient's fracture. The patient had a stockinette then placed over her arm along with Webril and fiberglass tape. A 3-point mold with ulnar deviation of the wrist was held while the cast cured. The final FluoroScan images were taken of the fracture reduction. The patient was then placed in a sling. She was awoken from anesthesia and transferred to a  hospital stretcher. She was brought to the PACU in stable condition. I spoke with the patient's husband following the surgery to let him know the case had gone without complication and the patient was stable in the recovery room. In the PACU, the patient was neurovascularly intact.    ____________________________ Timoteo Gaul, MD [id number] D: 11/07/2014 19:01:45 ET T: 11/08/2014 02:18:50 ET JOB#: [id number]  cc: Timoteo Gaul, MD, <Dictator> Timoteo Gaul MD ELECTRONICALLY SIGNED 11/09/2014 13:56

## 2014-12-03 DIAGNOSIS — S52571D Other intraarticular fracture of lower end of right radius, subsequent encounter for closed fracture with routine healing: Secondary | ICD-10-CM | POA: Diagnosis not present

## 2014-12-06 ENCOUNTER — Other Ambulatory Visit: Payer: Self-pay

## 2014-12-07 DIAGNOSIS — M25431 Effusion, right wrist: Secondary | ICD-10-CM | POA: Diagnosis not present

## 2014-12-07 DIAGNOSIS — M25531 Pain in right wrist: Secondary | ICD-10-CM | POA: Diagnosis not present

## 2014-12-10 ENCOUNTER — Ambulatory Visit (INDEPENDENT_AMBULATORY_CARE_PROVIDER_SITE_OTHER): Payer: Medicare Other | Admitting: Internal Medicine

## 2014-12-10 ENCOUNTER — Encounter: Payer: Self-pay | Admitting: Internal Medicine

## 2014-12-10 VITALS — BP 90/60 | HR 69 | Ht 70.0 in | Wt 171.4 lb

## 2014-12-10 DIAGNOSIS — I4819 Other persistent atrial fibrillation: Secondary | ICD-10-CM

## 2014-12-10 DIAGNOSIS — I4891 Unspecified atrial fibrillation: Secondary | ICD-10-CM | POA: Diagnosis not present

## 2014-12-10 DIAGNOSIS — I481 Persistent atrial fibrillation: Secondary | ICD-10-CM

## 2014-12-10 MED ORDER — FLECAINIDE ACETATE 100 MG PO TABS: 100.0000 mg | ORAL_TABLET | Freq: Two times a day (BID) | ORAL | Status: DC

## 2014-12-10 NOTE — Progress Notes (Signed)
Electrophysiology Office Note   Date:  12/10/2014   ID:  Monica Whitaker, DOB 26-Jun-1946, MRN [id number]  PCP:  Arnette Norris, MD  Primary Electrophysiologist: Thompson Grayer, MD    Chief Complaint  Patient presents with  . Atrial Fibrillation     History of Present Illness: Monica Whitaker is a 69 y.o. female who presents today for electrophysiology evaluation.   She recently fell while doing yoga and fractured her R wrist.  She continues to make slow recovery with moderate pain.  She has afib about once per month.  She has found that drinking a very cold glass of water will terminate her afib.    Today, she denies symptoms of  chest pain, shortness of breath, orthopnea, PND, lower extremity edema, claudication, dizziness, presyncope, syncope, bleeding, or neurologic sequela. The patient is tolerating medications without difficulties and is otherwise without complaint today.    Past Medical History  Diagnosis Date  . Anxiety   . Depression   . CVA (cerebral vascular accident) 12/2007  . Ventricular tachycardia     pt told at [facility] that she had RVOT VT  . Atrial fibrillation     paroxysmal, failed medical therapy with flecainide,  NSVT with tikosyn  . Thyroid disease   . Stroke   . Bruises easily    Past Surgical History  Procedure Laterality Date  . Vaginal hysterectomy    . Tonsillectomy    . Eye surgery    . Tee without cardioversion N/A 10/18/2012    Procedure: TRANSESOPHAGEAL ECHOCARDIOGRAM (TEE);  Surgeon: Lelon Perla, MD;  Location: [facility] ENDOSCOPY;  Service: Cardiovascular;  Laterality: N/A;  Pre-Ablation at 12pm  . Atrial fibrillation ablation  10/18/12    PVI by Dr Rayann Heman  . Atrial fibrillation ablation N/A 10/18/2012    Procedure: ATRIAL FIBRILLATION ABLATION;  Surgeon: Thompson Grayer, MD;  Location: [facility] CATH LAB;  Service: Cardiovascular;  Laterality: N/A;  . Open reduction internal fixation (orif) distal radial fracture Right 11/15/2014    Procedure: OPEN REDUCTION  INTERNAL FIXATION (ORIF) RIGHT DISTAL RADIAL FRACTURE WITH ALLOGRAFT BONE GRAFT;  Surgeon: Roseanne Kaufman, MD;  Location: [place];  Service: Orthopedics;  Laterality: Right;     Current Outpatient Prescriptions  Medication Sig Dispense Refill  . acetaminophen (TYLENOL) 500 MG tablet Take 2 tablets by mouth every 6 hours as needed for pain    . calcium carbonate (OS-CAL) 600 MG TABS Take 600 mg by mouth 2 (two) times daily with a meal.      . cholecalciferol (VITAMIN D) 1000 UNITS tablet Take 1,000 Units by mouth daily.      Marland Kitchen dextroamphetamine (DEXTROSTAT) 5 MG tablet Take 5 mg by mouth daily.     Marland Kitchen diltiazem (CARDIZEM CD) 180 MG 24 hr capsule Take 1 capsule (180 mg total) by mouth daily. 90 capsule 3  . docusate sodium (COLACE) 100 MG capsule Take 1 capsule (100 mg total) by mouth daily as needed for constipation. 30 capsule 0  . DULoxetine (CYMBALTA) 30 MG capsule Take 1 capsule by mouth at bedtime. Takes a 60 mg capsule by mouth in the morning    . DULoxetine (CYMBALTA) 60 MG capsule Take 60 mg by mouth daily.    . flecainide (TAMBOCOR) 50 MG tablet Take 1.5 tablets (75 mg total) by mouth 2 (two) times daily. 270 tablet 3  . HYDROcodone-acetaminophen (NORCO/VICODIN) 5-325 MG per tablet Take 1 tablet by mouth 2 (two) times daily.    Marland Kitchen levothyroxine (  SYNTHROID, LEVOTHROID) 25 MCG tablet TAKE 1 TABLET EVERY DAY (Patient taking differently: TAKE 1 TABLET BY MOUTH EVERY DAY) 90 tablet 1  . methocarbamol (ROBAXIN) 500 MG tablet Take 1 tablet (500 mg total) by mouth 4 (four) times daily. 30 tablet 0  . Multiple Vitamin (MULTIVITAMIN) tablet Take 1 tablet by mouth daily.      . Oral Electrolytes (BUFFERED SALT PO) Take 1 capsule by mouth daily as needed (electrolyte supplement).     Marland Kitchen oxyCODONE (OXY IR/ROXICODONE) 5 MG immediate release tablet Take 1 tablet (5 mg total) by mouth every 4 (four) hours as needed for severe pain. 50 tablet 0  . polyethylene glycol powder  (GLYCOLAX/MIRALAX) powder Take 17 g by mouth daily as needed (constipation).    . promethazine (PHENERGAN) 12.5 MG tablet Take 12.5 mg by mouth every 6 (six) hours as needed for nausea or vomiting.    . Timolol Maleate (TIMOPTIC OP) Place 1 drop into the left eye daily.     . vitamin E 400 UNIT capsule Take 400 Units by mouth daily.      Alveda Reasons 20 MG TABS tablet TAKE 1 TABLET BY MOUTH EVERY DAY 90 tablet 1   No current facility-administered medications for this visit.    Allergies:   Darvon; Propoxyphene n-acetaminophen; and Levofloxacin   Social History:  The patient  reports that she has never smoked. She has never used smokeless tobacco. She reports that she drinks about 0.5 oz of alcohol per week. She reports that she does not use illicit drugs.   Family History:  The patient's  family history includes Multiple sclerosis in her father.    ROS:  Please see the history of present illness.   All other systems are reviewed and negative.    PHYSICAL EXAM: VS:  BP 90/60 mmHg  Pulse 69  Ht 5\' 10"  (1.778 m)  Wt 171 lb 6.4 oz (77.747 kg)  BMI 24.59 kg/m2 , BMI Body mass index is 24.59 kg/(m^2). GEN: Well nourished, well developed, in no acute distress HEENT: normal Neck: no JVD, carotid bruits, or masses Cardiac: RRR; no murmurs, rubs, or gallops,no edema  Respiratory:  clear to auscultation bilaterally, normal work of breathing GI: soft, nontender, nondistended, + BS MS: R wrist is in a splint Skin: warm and dry  Neuro:  Strength and sensation are intact Psych: euthymic mood, full affect  EKG:  EKG is ordered today. The ekg ordered today shows sinus rhythm 71 bpm, PR 154, QRS 80, Qtc 434, nonspecific St/T changes   Recent Labs: 05/07/2014: ALT 17 11/07/2014: Platelets 275 11/15/2014: BUN 17; Creatinine 0.60; Hemoglobin 16.0*; Potassium 3.7; Sodium 138    Lipid Panel     Component Value Date/Time   CHOL 233* 09/18/2013 0844   TRIG 74.0 09/18/2013 0844   HDL 92.20  09/18/2013 0844   CHOLHDL 3 09/18/2013 0844   VLDL 14.8 09/18/2013 0844   LDLCALC 114 03/28/2010   LDLDIRECT 123.5 09/18/2013 0844     Wt Readings from Last 3 Encounters:  12/10/14 171 lb 6.4 oz (77.747 kg)  11/28/14 169 lb 8 oz (76.885 kg)  11/15/14 173 lb 8 oz (78.699 kg)     ASSESSMENT AND PLAN:  1. Persistent Afib, s/p PVI 10/08/12.  Continues to have occasional afib continue diltiazem 180 mg daily. Continue xarelto (Chads2vasc 4) Increase flecainide to 100mg  BId (recent flecainide level and AF clinic notes are reviewed) Return in 1 week for repeat ekg   F/u in afib clinic in  3 months. I will see again in 6 months     Current medicines are reviewed at length with the patient today.   The patient does not have concerns regarding her medicines.  The following changes were made today:  none  Signed, Thompson Grayer, MD  12/10/2014 3:23 PM     [place] [address] [phone] (office) [phone] (fax)

## 2014-12-10 NOTE — Patient Instructions (Signed)
Medication Instructions:  Your physician has recommended you make the following change in your medication:  1) Increase Flecainide to 100 mg twice daily   Labwork: None ordered  Testing/Procedures: None ordered  Follow-Up: Your physician recommends that you schedule a follow-up appointment in: 1 week nurse room for an EKG, 3 months with Roderic Palau, NP and 6 months with Dr Rayann Heman   Any Other Special Instructions Will Be Listed Below (If Applicable).

## 2014-12-12 ENCOUNTER — Encounter: Payer: Self-pay | Admitting: Family Medicine

## 2014-12-12 ENCOUNTER — Ambulatory Visit (INDEPENDENT_AMBULATORY_CARE_PROVIDER_SITE_OTHER): Payer: Medicare Other | Admitting: Family Medicine

## 2014-12-12 VITALS — BP 90/60 | HR 73 | Temp 98.6°F | Ht 70.0 in | Wt 171.8 lb

## 2014-12-12 DIAGNOSIS — M7501 Adhesive capsulitis of right shoulder: Secondary | ICD-10-CM | POA: Diagnosis not present

## 2014-12-12 DIAGNOSIS — R269 Unspecified abnormalities of gait and mobility: Secondary | ICD-10-CM

## 2014-12-12 DIAGNOSIS — I69398 Other sequelae of cerebral infarction: Secondary | ICD-10-CM | POA: Diagnosis not present

## 2014-12-12 NOTE — Progress Notes (Signed)
Dr. Frederico Hamman T. Ermalinda Joubert, MD, [facility] [place], [address] Phone: [phone] Fax: [phone]  12/12/2014  Patient: Monica Whitaker, MRN: [id number], DOB: Sep 18, 1945, 69 y.o.  Primary Physician:  Arnette Norris, MD  Chief Complaint: Follow-up  Subjective:   Monica Whitaker is a 69 y.o. very pleasant female patient who presents with the following:  Frozen shoulder resolved.  R foot feels tingling and numb.   She is here for her follow-up on her frozen shoulder, which is essentially fully better at this point. She thinks that this got better within about 2 or 3 weeks if seeing me. Unfortunately she fell and had a complex distal radius and ulna fracture that required operative fixation, and she is still recovering from this. She is 4 weeks postoperative at this point.   10/17/2014 Last OV with Owens Loffler, MD  RHD - started about 3 months ago and a month ago it got really bad. Yoga and core will hurt it a whole lot. IROM hurts it a lot more. She has no known specific injury that she can recall.  Patient is right-hand dominant.  About 3 months ago, the patient started to have an indolent onset of some shoulder pain, and she started to have more and more pain with abduction and internal range of motion.  She also has some pain in a T-shirt distribution, and she has pain at night when she rolls on that side.  She has never had any dislocation or prior operations in the affected shoulder.  CVA 6 years ago. Does water aerobics, yoga, core work. Even with all that, she does not walk right and will get some lower back pain  Past Medical History, Surgical History, Social History, Family History, Problem List, Medications, and Allergies have been reviewed and updated if relevant.  GEN: No fevers, chills. Nontoxic. Primarily MSK c/o today. MSK: Detailed in the HPI GI: tolerating PO intake without difficulty Neuro: No numbness,  parasthesias, or tingling associated. Otherwise the pertinent positives of the ROS are noted above.   Objective:   BP 90/60 mmHg  Pulse 73  Temp(Src) 98.6 F (37 C) (Oral)  Ht 5\' 10"  (1.778 m)  Wt 171 lb 12 oz (77.905 kg)  BMI 24.64 kg/m2   GEN: WDWN, NAD, Non-toxic, Alert & Oriented x 3 HEENT: Atraumatic, Normocephalic.  Ears and Nose: No external deformity. EXTR: No clubbing/cyanosis/edema NEURO: Normal gait.  PSYCH: Normally interactive. Conversant. Not depressed or anxious appearing.  Calm demeanor.   Shoulder: R and L Normal motion. Strength is also normal. Limited evaluation of the right upper extremity given fracture.  Excellent hip range of motion with strength that is 4+ to 5 out of 5 in all directions including at the knee and ankle.  Radiology: No results found.   Assessment and Plan:   Frozen shoulder, right  Gait disturbance, post-stroke   Frozen shoulder is resolved.  Gait disturbance poststroke, she is concerned about walking with her current fracture. She wanted some advice about things she can do to maintain her leg strength while she is recovering from her fracture.  I have given her rehabilitation handouts for her foot, ankle, knee, and hip. From [facility].  Signed,  Maud Deed. Gizella Belleville, MD   Patient's Medications  New Prescriptions   No medications on file  Previous Medications   ACETAMINOPHEN (TYLENOL) 500 MG TABLET    Take 2 tablets by mouth every 6 hours as needed for  pain   CALCIUM CARBONATE (OS-CAL) 600 MG TABS    Take 600 mg by mouth 2 (two) times daily with a meal.     CHOLECALCIFEROL (VITAMIN D) 1000 UNITS TABLET    Take 1,000 Units by mouth daily.     DEXTROAMPHETAMINE (DEXTROSTAT) 5 MG TABLET    Take 5 mg by mouth daily.    DILTIAZEM (CARDIZEM CD) 180 MG 24 HR CAPSULE    Take 1 capsule (180 mg total) by mouth daily.   DOCUSATE SODIUM (COLACE) 100 MG CAPSULE    Take 1 capsule (100 mg total) by mouth daily as needed for constipation.    DULOXETINE (CYMBALTA) 30 MG CAPSULE    Take 1 capsule by mouth at bedtime. Takes a 60 mg capsule by mouth in the morning   DULOXETINE (CYMBALTA) 60 MG CAPSULE    Take 60 mg by mouth daily.   FLECAINIDE (TAMBOCOR) 100 MG TABLET    Take 1 tablet (100 mg total) by mouth 2 (two) times daily.   HYDROCODONE-ACETAMINOPHEN (NORCO/VICODIN) 5-325 MG PER TABLET    Take 1 tablet by mouth 2 (two) times daily.   LEVOTHYROXINE (SYNTHROID, LEVOTHROID) 25 MCG TABLET    TAKE 1 TABLET EVERY DAY   METHOCARBAMOL (ROBAXIN) 500 MG TABLET    Take 1 tablet (500 mg total) by mouth 4 (four) times daily.   MULTIPLE VITAMIN (MULTIVITAMIN) TABLET    Take 1 tablet by mouth daily.     ORAL ELECTROLYTES (BUFFERED SALT PO)    Take 1 capsule by mouth daily as needed (electrolyte supplement).    OXYCODONE (OXY IR/ROXICODONE) 5 MG IMMEDIATE RELEASE TABLET    Take 1 tablet (5 mg total) by mouth every 4 (four) hours as needed for severe pain.   POLYETHYLENE GLYCOL POWDER (GLYCOLAX/MIRALAX) POWDER    Take 17 g by mouth daily as needed (constipation).   PROMETHAZINE (PHENERGAN) 12.5 MG TABLET    Take 12.5 mg by mouth every 6 (six) hours as needed for nausea or vomiting.   TIMOLOL MALEATE (TIMOPTIC OP)    Place 1 drop into the left eye daily.    VITAMIN E 400 UNIT CAPSULE    Take 400 Units by mouth daily.     XARELTO 20 MG TABS TABLET    TAKE 1 TABLET BY MOUTH EVERY DAY  Modified Medications   No medications on file  Discontinued Medications   No medications on file

## 2014-12-12 NOTE — Progress Notes (Signed)
Pre visit review using our clinic review tool, if applicable. No additional management support is needed unless otherwise documented below in the visit note. 

## 2014-12-14 DIAGNOSIS — M25531 Pain in right wrist: Secondary | ICD-10-CM | POA: Diagnosis not present

## 2014-12-14 DIAGNOSIS — S52571D Other intraarticular fracture of lower end of right radius, subsequent encounter for closed fracture with routine healing: Secondary | ICD-10-CM | POA: Diagnosis not present

## 2014-12-14 DIAGNOSIS — M25431 Effusion, right wrist: Secondary | ICD-10-CM | POA: Diagnosis not present

## 2014-12-14 DIAGNOSIS — Z4789 Encounter for other orthopedic aftercare: Secondary | ICD-10-CM | POA: Diagnosis not present

## 2014-12-17 ENCOUNTER — Ambulatory Visit: Payer: Medicare Other | Admitting: Family Medicine

## 2014-12-19 DIAGNOSIS — M25531 Pain in right wrist: Secondary | ICD-10-CM | POA: Diagnosis not present

## 2014-12-19 DIAGNOSIS — M25431 Effusion, right wrist: Secondary | ICD-10-CM | POA: Diagnosis not present

## 2014-12-21 ENCOUNTER — Ambulatory Visit (INDEPENDENT_AMBULATORY_CARE_PROVIDER_SITE_OTHER): Payer: Medicare Other | Admitting: *Deleted

## 2014-12-21 VITALS — BP 96/60 | HR 66 | Wt 170.5 lb

## 2014-12-21 DIAGNOSIS — M25531 Pain in right wrist: Secondary | ICD-10-CM | POA: Diagnosis not present

## 2014-12-21 DIAGNOSIS — M25431 Effusion, right wrist: Secondary | ICD-10-CM | POA: Diagnosis not present

## 2014-12-21 DIAGNOSIS — I481 Persistent atrial fibrillation: Secondary | ICD-10-CM | POA: Diagnosis not present

## 2014-12-21 DIAGNOSIS — I4819 Other persistent atrial fibrillation: Secondary | ICD-10-CM

## 2014-12-21 NOTE — Progress Notes (Signed)
Patient here today for a follow up EKG after increasing flecainide to 100 mg BID.   Patient requesting samples of Xarelto: Lot: [id number] Exp: 6/18 # 2 bottles given

## 2014-12-24 DIAGNOSIS — M25531 Pain in right wrist: Secondary | ICD-10-CM | POA: Diagnosis not present

## 2014-12-24 DIAGNOSIS — M25431 Effusion, right wrist: Secondary | ICD-10-CM | POA: Diagnosis not present

## 2014-12-26 DIAGNOSIS — M25431 Effusion, right wrist: Secondary | ICD-10-CM | POA: Diagnosis not present

## 2014-12-26 DIAGNOSIS — M25531 Pain in right wrist: Secondary | ICD-10-CM | POA: Diagnosis not present

## 2015-01-01 DIAGNOSIS — H40003 Preglaucoma, unspecified, bilateral: Secondary | ICD-10-CM | POA: Diagnosis not present

## 2015-01-02 DIAGNOSIS — M25531 Pain in right wrist: Secondary | ICD-10-CM | POA: Diagnosis not present

## 2015-01-02 DIAGNOSIS — M25431 Effusion, right wrist: Secondary | ICD-10-CM | POA: Diagnosis not present

## 2015-01-04 DIAGNOSIS — M25431 Effusion, right wrist: Secondary | ICD-10-CM | POA: Diagnosis not present

## 2015-01-04 DIAGNOSIS — M25531 Pain in right wrist: Secondary | ICD-10-CM | POA: Diagnosis not present

## 2015-01-07 DIAGNOSIS — M25531 Pain in right wrist: Secondary | ICD-10-CM | POA: Diagnosis not present

## 2015-01-07 DIAGNOSIS — M25431 Effusion, right wrist: Secondary | ICD-10-CM | POA: Diagnosis not present

## 2015-01-07 DIAGNOSIS — S52571D Other intraarticular fracture of lower end of right radius, subsequent encounter for closed fracture with routine healing: Secondary | ICD-10-CM | POA: Diagnosis not present

## 2015-01-08 DIAGNOSIS — H40052 Ocular hypertension, left eye: Secondary | ICD-10-CM | POA: Diagnosis not present

## 2015-01-09 DIAGNOSIS — M25431 Effusion, right wrist: Secondary | ICD-10-CM | POA: Diagnosis not present

## 2015-01-09 DIAGNOSIS — M25531 Pain in right wrist: Secondary | ICD-10-CM | POA: Diagnosis not present

## 2015-01-15 DIAGNOSIS — M25531 Pain in right wrist: Secondary | ICD-10-CM | POA: Diagnosis not present

## 2015-01-15 DIAGNOSIS — M25431 Effusion, right wrist: Secondary | ICD-10-CM | POA: Diagnosis not present

## 2015-01-17 DIAGNOSIS — M25431 Effusion, right wrist: Secondary | ICD-10-CM | POA: Diagnosis not present

## 2015-01-17 DIAGNOSIS — M25531 Pain in right wrist: Secondary | ICD-10-CM | POA: Diagnosis not present

## 2015-01-22 DIAGNOSIS — M25531 Pain in right wrist: Secondary | ICD-10-CM | POA: Diagnosis not present

## 2015-01-22 DIAGNOSIS — M25431 Effusion, right wrist: Secondary | ICD-10-CM | POA: Diagnosis not present

## 2015-01-29 DIAGNOSIS — M25531 Pain in right wrist: Secondary | ICD-10-CM | POA: Diagnosis not present

## 2015-01-29 DIAGNOSIS — M25431 Effusion, right wrist: Secondary | ICD-10-CM | POA: Diagnosis not present

## 2015-02-15 DIAGNOSIS — Z4789 Encounter for other orthopedic aftercare: Secondary | ICD-10-CM | POA: Diagnosis not present

## 2015-03-19 ENCOUNTER — Encounter (HOSPITAL_COMMUNITY): Payer: Self-pay | Admitting: Nurse Practitioner

## 2015-03-19 ENCOUNTER — Ambulatory Visit (HOSPITAL_COMMUNITY)
Admission: RE | Admit: 2015-03-19 | Discharge: 2015-03-19 | Disposition: A | Discharge status: Home / Self Care | Payer: Medicare Other | Source: Ambulatory Visit | Attending: Nurse Practitioner | Admitting: Nurse Practitioner

## 2015-03-19 VITALS — BP 94/62 | HR 73 | Ht 70.0 in | Wt 171.6 lb

## 2015-03-19 DIAGNOSIS — I480 Paroxysmal atrial fibrillation: Secondary | ICD-10-CM | POA: Insufficient documentation

## 2015-03-19 DIAGNOSIS — Z79899 Other long term (current) drug therapy: Secondary | ICD-10-CM | POA: Insufficient documentation

## 2015-03-19 DIAGNOSIS — I959 Hypotension, unspecified: Secondary | ICD-10-CM | POA: Diagnosis not present

## 2015-03-19 DIAGNOSIS — Z8673 Personal history of transient ischemic attack (TIA), and cerebral infarction without residual deficits: Secondary | ICD-10-CM | POA: Diagnosis not present

## 2015-03-19 DIAGNOSIS — Z7902 Long term (current) use of antithrombotics/antiplatelets: Secondary | ICD-10-CM | POA: Diagnosis not present

## 2015-03-19 DIAGNOSIS — E079 Disorder of thyroid, unspecified: Secondary | ICD-10-CM | POA: Diagnosis not present

## 2015-03-19 NOTE — Patient Instructions (Signed)
Scheduler will be in touch for 3 month appointment with Dr. Rayann Heman

## 2015-03-19 NOTE — Progress Notes (Signed)
Patient ID: Monica Whitaker, female   DOB: 1945/11/04, 69 y.o.   MRN: [id number]     Primary Care Physician: Arnette Norris, MD Referring Physician: Dr. Sherryll Burger is a 69 y.o. female with a h/o PAF in the afib clinic for f/u. She reports she has not had any afib since the increase of flecainide. Is tolerating drug and feels well. Compliant with blood thinner.  Today, she denies symptoms of palpitations, chest pain, shortness of breath, orthopnea, PND, lower extremity edema, dizziness, presyncope, syncope, or neurologic sequela. The patient is tolerating medications without difficulties and is otherwise without complaint today.   Past Medical History  Diagnosis Date  . Anxiety   . Depression   . CVA (cerebral vascular accident) 12/2007  . Ventricular tachycardia     pt told at [facility] that she had RVOT VT  . Atrial fibrillation     paroxysmal, failed medical therapy with flecainide,  NSVT with tikosyn  . Thyroid disease   . Stroke   . Bruises easily    Past Surgical History  Procedure Laterality Date  . Vaginal hysterectomy    . Tonsillectomy    . Eye surgery    . Tee without cardioversion N/A 10/18/2012    Procedure: TRANSESOPHAGEAL ECHOCARDIOGRAM (TEE);  Surgeon: Lelon Perla, MD;  Location: [facility] ENDOSCOPY;  Service: Cardiovascular;  Laterality: N/A;  Pre-Ablation at 12pm  . Atrial fibrillation ablation  10/18/12    PVI by Dr Rayann Heman  . Atrial fibrillation ablation N/A 10/18/2012    Procedure: ATRIAL FIBRILLATION ABLATION;  Surgeon: Thompson Grayer, MD;  Location: [facility] CATH LAB;  Service: Cardiovascular;  Laterality: N/A;  . Open reduction internal fixation (orif) distal radial fracture Right 11/15/2014    Procedure: OPEN REDUCTION INTERNAL FIXATION (ORIF) RIGHT DISTAL RADIAL FRACTURE WITH ALLOGRAFT BONE GRAFT;  Surgeon: Roseanne Kaufman, MD;  Location: [place];  Service: Orthopedics;  Laterality: Right;    Current Outpatient Prescriptions  Medication Sig  Dispense Refill  . acetaminophen (TYLENOL) 500 MG tablet Take 2 tablets by mouth every 6 hours as needed for pain    . calcium carbonate (OS-CAL) 600 MG TABS Take 600 mg by mouth daily with breakfast.     . cholecalciferol (VITAMIN D) 1000 UNITS tablet Take 1,000 Units by mouth daily.      Marland Kitchen dextroamphetamine (DEXTROSTAT) 5 MG tablet Take 5 mg by mouth daily.     Marland Kitchen diltiazem (CARDIZEM CD) 180 MG 24 hr capsule Take 1 capsule (180 mg total) by mouth daily. 90 capsule 3  . DULoxetine (CYMBALTA) 30 MG capsule Take 1 capsule by mouth at bedtime.     . DULoxetine (CYMBALTA) 60 MG capsule Take 60 mg by mouth every morning.     . flecainide (TAMBOCOR) 100 MG tablet Take 1 tablet (100 mg total) by mouth 2 (two) times daily. 180 tablet 3  . levothyroxine (SYNTHROID, LEVOTHROID) 25 MCG tablet TAKE 1 TABLET EVERY DAY (Patient taking differently: TAKE 1 TABLET BY MOUTH EVERY DAY) 90 tablet 1  . Multiple Vitamin (MULTIVITAMIN) tablet Take 1 tablet by mouth daily.      . Oral Electrolytes (BUFFERED SALT PO) Take 1 capsule by mouth daily as needed (electrolyte supplement).     . Timolol Maleate (TIMOPTIC OP) Place 1 drop into the left eye daily.     . vitamin E 400 UNIT capsule Take 400 Units by mouth daily.      Alveda Reasons 20 MG TABS tablet TAKE 1 TABLET  BY MOUTH EVERY DAY 90 tablet 1   No current facility-administered medications for this encounter.    Allergies  Allergen Reactions  . Darvon Other (See Comments)    Severe panic attacks  . Propoxyphene N-Acetaminophen Other (See Comments)    Severe panic attacks  . Levofloxacin Anxiety and Other (See Comments)    Causes panic attacks.    Social History   Social History  . Marital Status: Married    Spouse Name: N/A  . Number of Children: 0  . Years of Education: N/A   Occupational History  . Retired    Social History Main Topics  . Smoking status: Never Smoker   . Smokeless tobacco: Never Used  . Alcohol Use: 0.5 oz/week    1 drink(s) per  week     Comment: regular  . Drug Use: No  . Sexual Activity: Not on file   Other Topics Concern  . Not on file   Social History Narrative   Lives in [place] with her husband.  No children.  Former Psychologist, prison and probation services      Has a living will-    Would desire CPR   Would not want prolonged life support if futile.    Family History  Problem Relation Age of Onset  . Multiple sclerosis Father     ROS- All systems are reviewed and negative except as per the HPI above  Physical Exam: Filed Vitals:   03/19/15 1002  BP: 94/62  Pulse: 73  Height: 5\' 10"  (1.778 m)  Weight: 171 lb 9.6 oz (77.837 kg)    GEN- The patient is well appearing, alert and oriented x 3 today.   Head- normocephalic, atraumatic Eyes-  Sclera clear, conjunctiva pink Ears- hearing intact Oropharynx- clear Neck- supple, no JVP Lymph- no cervical lymphadenopathy Lungs- Clear to ausculation bilaterally, normal work of breathing Heart- Regular rate and rhythm, no murmurs, rubs or gallops, PMI not laterally displaced GI- soft, NT, ND, + BS Extremities- no clubbing, cyanosis, or edema MS- no significant deformity or atrophy Skin- no rash or lesion Psych- euthymic mood, full affect Neuro- strength and sensation are intact  EKG-NSR at 73 bpm, Print 152 ms, QRS int 84 ms, QTc 405 ms  Assessment and Plan: 1. PAF Doing very well on flecainide without any noticeable afib burden. Continue with diltiazem Continue xarelto 20 mg daily Last bmet in April with crcl calculated at 169ml/min   2.H/o hypotension Stable Uses salt tablets as needed   F/u in 3 months with Dr. Rayann Heman as already scheduled   Monica Whitaker, [facility] [address] [place], [place] [address] [phone]

## 2015-03-20 ENCOUNTER — Ambulatory Visit: Payer: Medicare Other | Admitting: Family Medicine

## 2015-03-27 ENCOUNTER — Encounter: Payer: Self-pay | Admitting: Primary Care

## 2015-03-27 ENCOUNTER — Ambulatory Visit (INDEPENDENT_AMBULATORY_CARE_PROVIDER_SITE_OTHER): Payer: Medicare Other | Admitting: Primary Care

## 2015-03-27 VITALS — BP 90/64 | HR 69 | Temp 97.6°F | Ht 70.0 in | Wt 172.8 lb

## 2015-03-27 DIAGNOSIS — N390 Urinary tract infection, site not specified: Secondary | ICD-10-CM | POA: Diagnosis not present

## 2015-03-27 LAB — POCT URINALYSIS DIPSTICK
Bilirubin, UA: NEGATIVE
Blood, UA: NEGATIVE
Glucose, UA: NEGATIVE
Ketones, UA: NEGATIVE
Nitrite, UA: POSITIVE
SPEC GRAV UA: 1.015
UROBILINOGEN UA: 0.2
pH, UA: 7

## 2015-03-27 MED ORDER — CEPHALEXIN 500 MG PO CAPS: 500.0000 mg | ORAL_CAPSULE | Freq: Two times a day (BID) | ORAL | Status: DC

## 2015-03-27 NOTE — Progress Notes (Signed)
Pre visit review using our clinic review tool, if applicable. No additional management support is needed unless otherwise documented below in the visit note. 

## 2015-03-27 NOTE — Patient Instructions (Signed)
Start Cephalexin antibiotic for urinary tract infection. Take 1 tablet by mouth twice daily for 7 days.  Push intake of water.   Follow up if no improvement in symptoms.  It was a pleasure meeting you!  Urinary Tract Infection Urinary tract infections (UTIs) can develop anywhere along your urinary tract. Your urinary tract is your body's drainage system for removing wastes and extra water. Your urinary tract includes two kidneys, two ureters, a bladder, and a urethra. Your kidneys are a pair of bean-shaped organs. Each kidney is about the size of your fist. They are located below your ribs, one on each side of your spine. CAUSES Infections are caused by microbes, which are microscopic organisms, including fungi, viruses, and bacteria. These organisms are so small that they can only be seen through a microscope. Bacteria are the microbes that most commonly cause UTIs. SYMPTOMS  Symptoms of UTIs may vary by age and gender of the patient and by the location of the infection. Symptoms in young women typically include a frequent and intense urge to urinate and a painful, burning feeling in the bladder or urethra during urination. Older women and men are more likely to be tired, shaky, and weak and have muscle aches and abdominal pain. A fever may mean the infection is in your kidneys. Other symptoms of a kidney infection include pain in your back or sides below the ribs, nausea, and vomiting. DIAGNOSIS To diagnose a UTI, your caregiver will ask you about your symptoms. Your caregiver also will ask to provide a urine sample. The urine sample will be tested for bacteria and white blood cells. White blood cells are made by your body to help fight infection. TREATMENT  Typically, UTIs can be treated with medication. Because most UTIs are caused by a bacterial infection, they usually can be treated with the use of antibiotics. The choice of antibiotic and length of treatment depend on your symptoms and the  type of bacteria causing your infection. HOME CARE INSTRUCTIONS  If you were prescribed antibiotics, take them exactly as your caregiver instructs you. Finish the medication even if you feel better after you have only taken some of the medication.  Drink enough water and fluids to keep your urine clear or pale yellow.  Avoid caffeine, tea, and carbonated beverages. They tend to irritate your bladder.  Empty your bladder often. Avoid holding urine for long periods of time.  Empty your bladder before and after sexual intercourse.  After a bowel movement, women should cleanse from front to back. Use each tissue only once. SEEK MEDICAL CARE IF:   You have back pain.  You develop a fever.  Your symptoms do not begin to resolve within 3 days. SEEK IMMEDIATE MEDICAL CARE IF:   You have severe back pain or lower abdominal pain.  You develop chills.  You have nausea or vomiting.  You have continued burning or discomfort with urination. MAKE SURE YOU:   Understand these instructions.  Will watch your condition.  Will get help right away if you are not doing well or get worse. Document Released: 04/29/2005 Document Revised: 01/19/2012 Document Reviewed: 08/28/2011 [facility] Patient Information 2015 [place], [place]. This information is not intended to replace advice given to you by your health care provider. Make sure you discuss any questions you have with your health care provider.

## 2015-03-27 NOTE — Progress Notes (Signed)
Subjective:    Patient ID: Monica Whitaker, female    DOB: May 30, 1946, 69 y.o.   MRN: [id number]  HPI  Monica Whitaker is a 69 year old female who presents today with a chief complaint of dysuria. She also reports symptoms of foul smelling urine and increased frequency. Her symptoms have been present for 24 hours. Denies hematuria, vaginal discharge, fevers, flank pain. She's taken AZO OTC with some improvement in symptoms.  Review of Systems  Constitutional: Negative for fever.  Respiratory: Negative for shortness of breath.   Cardiovascular: Negative for chest pain.  Genitourinary: Positive for dysuria, urgency and frequency. Negative for hematuria, flank pain, vaginal discharge and difficulty urinating.       Past Medical History  Diagnosis Date  . Anxiety   . Depression   . CVA (cerebral vascular accident) 12/2007  . Ventricular tachycardia     pt told at [facility] that she had RVOT VT  . Atrial fibrillation     paroxysmal, failed medical therapy with flecainide,  NSVT with tikosyn  . Thyroid disease   . Stroke   . Bruises easily     Social History   Social History  . Marital Status: Married    Spouse Name: N/A  . Number of Children: 0  . Years of Education: N/A   Occupational History  . Retired    Social History Main Topics  . Smoking status: Never Smoker   . Smokeless tobacco: Never Used  . Alcohol Use: 0.5 oz/week    1 drink(s) per week     Comment: regular  . Drug Use: No  . Sexual Activity: Not on file   Other Topics Concern  . Not on file   Social History Narrative   Lives in [place] with her husband.  No children.  Former Psychologist, prison and probation services      Has a living will-    Would desire CPR   Would not want prolonged life support if futile.    Past Surgical History  Procedure Laterality Date  . Vaginal hysterectomy    . Tonsillectomy    . Eye surgery    . Tee without cardioversion N/A 10/18/2012    Procedure: TRANSESOPHAGEAL ECHOCARDIOGRAM (TEE);   Surgeon: Lelon Perla, MD;  Location: [facility] ENDOSCOPY;  Service: Cardiovascular;  Laterality: N/A;  Pre-Ablation at 12pm  . Atrial fibrillation ablation  10/18/12    PVI by Dr Rayann Heman  . Atrial fibrillation ablation N/A 10/18/2012    Procedure: ATRIAL FIBRILLATION ABLATION;  Surgeon: Thompson Grayer, MD;  Location: [facility] CATH LAB;  Service: Cardiovascular;  Laterality: N/A;  . Open reduction internal fixation (orif) distal radial fracture Right 11/15/2014    Procedure: OPEN REDUCTION INTERNAL FIXATION (ORIF) RIGHT DISTAL RADIAL FRACTURE WITH ALLOGRAFT BONE GRAFT;  Surgeon: Roseanne Kaufman, MD;  Location: [place];  Service: Orthopedics;  Laterality: Right;    Family History  Problem Relation Age of Onset  . Multiple sclerosis Father     Allergies  Allergen Reactions  . Darvon Other (See Comments)    Severe panic attacks  . Propoxyphene N-Acetaminophen Other (See Comments)    Severe panic attacks  . Levofloxacin Anxiety and Other (See Comments)    Causes panic attacks.    Current Outpatient Prescriptions on File Prior to Visit  Medication Sig Dispense Refill  . acetaminophen (TYLENOL) 500 MG tablet Take 2 tablets by mouth every 6 hours as needed for pain    . calcium carbonate (OS-CAL) 600 MG TABS  Take 600 mg by mouth daily with breakfast.     . cholecalciferol (VITAMIN D) 1000 UNITS tablet Take 1,000 Units by mouth daily.      Marland Kitchen dextroamphetamine (DEXTROSTAT) 5 MG tablet Take 5 mg by mouth daily.     Marland Kitchen diltiazem (CARDIZEM CD) 180 MG 24 hr capsule Take 1 capsule (180 mg total) by mouth daily. 90 capsule 3  . DULoxetine (CYMBALTA) 30 MG capsule Take 1 capsule by mouth at bedtime.     . DULoxetine (CYMBALTA) 60 MG capsule Take 60 mg by mouth every morning.     . flecainide (TAMBOCOR) 100 MG tablet Take 1 tablet (100 mg total) by mouth 2 (two) times daily. 180 tablet 3  . levothyroxine (SYNTHROID, LEVOTHROID) 25 MCG tablet TAKE 1 TABLET EVERY DAY (Patient taking differently: TAKE  1 TABLET BY MOUTH EVERY DAY) 90 tablet 1  . Multiple Vitamin (MULTIVITAMIN) tablet Take 1 tablet by mouth daily.      . Oral Electrolytes (BUFFERED SALT PO) Take 1 capsule by mouth daily as needed (electrolyte supplement).     . Timolol Maleate (TIMOPTIC OP) Place 1 drop into the left eye daily.     . vitamin E 400 UNIT capsule Take 400 Units by mouth daily.      Alveda Reasons 20 MG TABS tablet TAKE 1 TABLET BY MOUTH EVERY DAY 90 tablet 1   No current facility-administered medications on file prior to visit.    BP 90/64 mmHg  Pulse 69  Temp(Src) 97.6 F (36.4 C) (Oral)  Ht 5\' 10"  (1.778 m)  Wt 172 lb 12.8 oz (78.382 kg)  BMI 24.79 kg/m2  SpO2 94%    Objective:   Physical Exam  Constitutional: She appears well-nourished.  Cardiovascular: Normal rate and regular rhythm.   Pulmonary/Chest: Effort normal and breath sounds normal.  Abdominal: Soft. Normal appearance. There is no tenderness. There is no CVA tenderness.  Skin: Skin is warm and dry.  Psychiatric: She has a normal mood and affect.          Assessment & Plan:  Urinary Tract Infection:  Dysuria, foul smelling urine, frequency x 24 hours. UA: Positive for leuks and nitrites. Negative for blood and glucose. Will send culture. Treat with Keflex BID x 7 days. Push fluids, continue AZO. Follow up PRN

## 2015-03-29 LAB — URINE CULTURE: Colony Count: 80000

## 2015-04-01 ENCOUNTER — Other Ambulatory Visit: Payer: Self-pay | Admitting: Family Medicine

## 2015-04-01 ENCOUNTER — Other Ambulatory Visit (INDEPENDENT_AMBULATORY_CARE_PROVIDER_SITE_OTHER): Payer: Medicare Other

## 2015-04-01 DIAGNOSIS — E039 Hypothyroidism, unspecified: Secondary | ICD-10-CM

## 2015-04-01 DIAGNOSIS — Z01419 Encounter for gynecological examination (general) (routine) without abnormal findings: Secondary | ICD-10-CM

## 2015-04-01 DIAGNOSIS — Z0000 Encounter for general adult medical examination without abnormal findings: Secondary | ICD-10-CM | POA: Diagnosis not present

## 2015-04-01 LAB — LIPID PANEL
CHOL/HDL RATIO: 2
Cholesterol: 208 mg/dL — ABNORMAL HIGH (ref 0–200)
HDL: 84.8 mg/dL (ref >39.00)
LDL CALC: 112 mg/dL — AB (ref 0–99)
NONHDL: 122.96
TRIGLYCERIDES: 53 mg/dL (ref 0.0–149.0)
VLDL: 10.6 mg/dL (ref 0.0–40.0)

## 2015-04-01 LAB — CBC WITH DIFFERENTIAL/PLATELET
BASOS ABS: 0 10*3/uL (ref 0.0–0.1)
Basophils Relative: 0.7 % (ref 0.0–3.0)
Eosinophils Absolute: 0.3 10*3/uL (ref 0.0–0.7)
Eosinophils Relative: 6.7 % — ABNORMAL HIGH (ref 0.0–5.0)
HEMATOCRIT: 40.5 % (ref 36.0–46.0)
Hemoglobin: 13.4 g/dL (ref 12.0–15.0)
Lymphocytes Relative: 39.5 % (ref 12.0–46.0)
Lymphs Abs: 2 10*3/uL (ref 0.7–4.0)
MCHC: 33.2 g/dL (ref 30.0–36.0)
MCV: 90.9 fl (ref 78.0–100.0)
MONOS PCT: 12 % (ref 3.0–12.0)
Monocytes Absolute: 0.6 10*3/uL (ref 0.1–1.0)
NEUTROS ABS: 2 10*3/uL (ref 1.4–7.7)
Neutrophils Relative %: 41.1 % — ABNORMAL LOW (ref 43.0–77.0)
PLATELETS: 363 10*3/uL (ref 150.0–400.0)
RBC: 4.45 Mil/uL (ref 3.87–5.11)
RDW: 13.9 % (ref 11.5–15.5)
WBC: 4.9 10*3/uL (ref 4.0–10.5)

## 2015-04-01 LAB — COMPREHENSIVE METABOLIC PANEL
ALT: 24 U/L (ref 0–35)
AST: 24 U/L (ref 0–37)
Albumin: 4 g/dL (ref 3.5–5.2)
Alkaline Phosphatase: 76 U/L (ref 39–117)
BUN: 16 mg/dL (ref 6–23)
CALCIUM: 9.5 mg/dL (ref 8.4–10.5)
CHLORIDE: 102 meq/L (ref 96–112)
CO2: 31 meq/L (ref 19–32)
Creatinine, Ser: 0.67 mg/dL (ref 0.40–1.20)
GFR: 93.12 mL/min (ref >60.00)
GLUCOSE: 58 mg/dL — AB (ref 70–99)
POTASSIUM: 4.3 meq/L (ref 3.5–5.1)
Sodium: 140 mEq/L (ref 135–145)
Total Bilirubin: 0.4 mg/dL (ref 0.2–1.2)
Total Protein: 7.1 g/dL (ref 6.0–8.3)

## 2015-04-01 LAB — TSH: TSH: 1.8 u[IU]/mL (ref 0.35–4.50)

## 2015-04-01 LAB — T4, FREE: Free T4: 0.84 ng/dL (ref 0.60–1.60)

## 2015-04-04 ENCOUNTER — Encounter: Payer: Self-pay | Admitting: Family Medicine

## 2015-04-04 ENCOUNTER — Ambulatory Visit (INDEPENDENT_AMBULATORY_CARE_PROVIDER_SITE_OTHER): Payer: Medicare Other | Admitting: Family Medicine

## 2015-04-04 VITALS — BP 122/74 | HR 80 | Temp 98.0°F | Ht 69.0 in | Wt 166.5 lb

## 2015-04-04 DIAGNOSIS — S62101S Fracture of unspecified carpal bone, right wrist, sequela: Secondary | ICD-10-CM

## 2015-04-04 DIAGNOSIS — E039 Hypothyroidism, unspecified: Secondary | ICD-10-CM | POA: Diagnosis not present

## 2015-04-04 DIAGNOSIS — I480 Paroxysmal atrial fibrillation: Secondary | ICD-10-CM | POA: Diagnosis not present

## 2015-04-04 DIAGNOSIS — F341 Dysthymic disorder: Secondary | ICD-10-CM

## 2015-04-04 DIAGNOSIS — Z23 Encounter for immunization: Secondary | ICD-10-CM | POA: Diagnosis not present

## 2015-04-04 DIAGNOSIS — Z0000 Encounter for general adult medical examination without abnormal findings: Secondary | ICD-10-CM

## 2015-04-04 NOTE — Progress Notes (Signed)
Subjective:   Patient ID: Monica Whitaker, female    DOB: 07/13/1946, 69 y.o.   MRN: [id number]  Monica Whitaker is a pleasant 69 y.o. year old female who presents to clinic today with Annual Exam and follow up of chronic medical conditions on 04/04/2015  HPI: I have personally reviewed the Medicare Annual Wellness questionnaire and have noted 1. The patient's medical and social history 2. Their use of alcohol, tobacco or illicit drugs 3. Their current medications and supplements 4. The patient's functional ability including ADL's, fall risks, home safety risks and hearing or visual             impairment. 5. Diet and physical activities 6. Evidence for depression or mood disorders  End of life wishes discussed and updated in Social History.  The roster of all physicians providing medical care to patient - is listed in the CareTeams section of the chart.   Remote h/o hysterectomy Mammogram 05/09/14 Colonoscopy- 03/07/13- Dr. Rayann Heman, 10 year recall DEXA 10/25/13 Zostavax 04/18/08 Prevnar 13 10/02/13 Td 04/18/2008 Eye exam 10/2014    Afib- followed by Dr. Rayann Heman.  Last saw Roderic Palau, NP in a fib clinic on 03/19/2015.  Note reviewed..   Doing well on flecainide, advised to continue it along with diltiazem and xarelto for anticoagulation. Advised follow up with Dr. Rayann Heman in 3 months.  S/p ORIF of right distal wrist fracture with bone graft on 11/15/14- Dr. Amedeo Plenty. Sp PT/OT.  Doing well.  No pain.  Has FROM.  Remaining active- has not regained weight and she is pleased with this.  Lab Results  Component Value Date   CHOL 208* 04/01/2015   HDL 84.80 04/01/2015   LDLCALC 112* 04/01/2015   LDLDIRECT 123.5 09/18/2013   TRIG 53.0 04/01/2015   CHOLHDL 2 04/01/2015   Lab Results  Component Value Date   CREATININE 0.67 04/01/2015   Lab Results  Component Value Date   NA 140 04/01/2015   K 4.3 04/01/2015   CL 102 04/01/2015   CO2 31 04/01/2015   Lab Results  Component Value  Date   ALT 24 04/01/2015   AST 24 04/01/2015   ALKPHOS 76 04/01/2015   BILITOT 0.4 04/01/2015   Lab Results  Component Value Date   TSH 1.80 04/01/2015   Lab Results  Component Value Date   WBC 4.9 04/01/2015   HGB 13.4 04/01/2015   HCT 40.5 04/01/2015   MCV 90.9 04/01/2015   PLT 363.0 04/01/2015      Patient Active Problem List   Diagnosis Date Noted  . Medicare annual wellness visit, subsequent 04/04/2015  . Wrist fracture, right 11/12/2014  . ANXIETY DEPRESSION 04/10/2010  . CEREBRAL EMBOLISM WITHOUT MENTION INFARCT 04/10/2010  . Atrial fibrillation 03/11/2010  . FATIGUE / MALAISE 03/11/2010   Past Medical History  Diagnosis Date  . Anxiety   . Depression   . CVA (cerebral vascular accident) 12/2007  . Ventricular tachycardia     pt told at [facility] that she had RVOT VT  . Atrial fibrillation     paroxysmal, failed medical therapy with flecainide,  NSVT with tikosyn  . Thyroid disease   . Stroke   . Bruises easily    Past Surgical History  Procedure Laterality Date  . Vaginal hysterectomy    . Tonsillectomy    . Eye surgery    . Tee without cardioversion N/A 10/18/2012    Procedure: TRANSESOPHAGEAL ECHOCARDIOGRAM (TEE);  Surgeon: Lelon Perla, MD;  Location: [facility] ENDOSCOPY;  Service: Cardiovascular;  Laterality: N/A;  Pre-Ablation at 12pm  . Atrial fibrillation ablation  10/18/12    PVI by Dr Rayann Heman  . Atrial fibrillation ablation N/A 10/18/2012    Procedure: ATRIAL FIBRILLATION ABLATION;  Surgeon: Thompson Grayer, MD;  Location: [facility] CATH LAB;  Service: Cardiovascular;  Laterality: N/A;  . Open reduction internal fixation (orif) distal radial fracture Right 11/15/2014    Procedure: OPEN REDUCTION INTERNAL FIXATION (ORIF) RIGHT DISTAL RADIAL FRACTURE WITH ALLOGRAFT BONE GRAFT;  Surgeon: Roseanne Kaufman, MD;  Location: [place];  Service: Orthopedics;  Laterality: Right;   Social History  Substance Use Topics  . Smoking status: Never Smoker   .  Smokeless tobacco: Never Used  . Alcohol Use: 0.5 oz/week    1 drink(s) per week     Comment: regular   Family History  Problem Relation Age of Onset  . Multiple sclerosis Father    Allergies  Allergen Reactions  . Darvon Other (See Comments)    Severe panic attacks  . Propoxyphene N-Acetaminophen Other (See Comments)    Severe panic attacks  . Levofloxacin Anxiety and Other (See Comments)    Causes panic attacks.   Current Outpatient Prescriptions on File Prior to Visit  Medication Sig Dispense Refill  . acetaminophen (TYLENOL) 500 MG tablet Take 2 tablets by mouth every 6 hours as needed for pain    . calcium carbonate (OS-CAL) 600 MG TABS Take 600 mg by mouth daily with breakfast.     . cholecalciferol (VITAMIN D) 1000 UNITS tablet Take 1,000 Units by mouth daily.      Marland Kitchen dextroamphetamine (DEXTROSTAT) 5 MG tablet Take 5 mg by mouth daily.     Marland Kitchen diltiazem (CARDIZEM CD) 180 MG 24 hr capsule Take 1 capsule (180 mg total) by mouth daily. 90 capsule 3  . DULoxetine (CYMBALTA) 30 MG capsule Take 1 capsule by mouth at bedtime.     . DULoxetine (CYMBALTA) 60 MG capsule Take 60 mg by mouth every morning.     . flecainide (TAMBOCOR) 100 MG tablet Take 1 tablet (100 mg total) by mouth 2 (two) times daily. 180 tablet 3  . levothyroxine (SYNTHROID, LEVOTHROID) 25 MCG tablet TAKE 1 TABLET EVERY DAY (Patient taking differently: TAKE 1 TABLET BY MOUTH EVERY DAY) 90 tablet 1  . Multiple Vitamin (MULTIVITAMIN) tablet Take 1 tablet by mouth daily.      . Oral Electrolytes (BUFFERED SALT PO) Take 1 capsule by mouth daily as needed (electrolyte supplement).     . Timolol Maleate (TIMOPTIC OP) Place 1 drop into the left eye daily.     . vitamin E 400 UNIT capsule Take 400 Units by mouth daily.      Alveda Reasons 20 MG TABS tablet TAKE 1 TABLET BY MOUTH EVERY DAY 90 tablet 1   No current facility-administered medications on file prior to visit.   The PMH, PSH, Social History, Family History,  Medications, and allergies have been reviewed in [facility], and have been updated if relevant.. Review of Systems  Constitutional: Negative.   HENT: Negative.   Respiratory: Negative.   Cardiovascular: Negative.   Gastrointestinal: Negative.   Endocrine: Negative.   Genitourinary: Negative.   Musculoskeletal: Negative.   Skin: Negative.   Allergic/Immunologic: Negative.   Neurological: Negative.   Hematological: Negative.   Psychiatric/Behavioral: Negative.   All other systems reviewed and are negative.       Objective:    BP 122/74 mmHg  Pulse 80  Temp(Src) 98 F (36.7  C) (Oral)  Ht 5\' 9"  (1.753 m)  Wt 166 lb 8 oz (75.524 kg)  BMI 24.58 kg/m2  SpO2 95%  Wt Readings from Last 3 Encounters:  04/04/15 166 lb 8 oz (75.524 kg)  03/27/15 172 lb 12.8 oz (78.382 kg)  03/19/15 171 lb 9.6 oz (77.837 kg)    Physical Exam    General:  Well-developed,well-nourished,in no acute distress; alert,appropriate and cooperative throughout examination Head:  normocephalic and atraumatic.   Eyes:  vision grossly intact, pupils equal, pupils round, and pupils reactive to light.   Ears:  R ear normal and L ear normal.   Nose:  no external deformity.   Mouth:  good dentition.   Neck:  No deformities, masses, or tenderness noted. Lungs:  Normal respiratory effort, chest expands symmetrically. Lungs are clear to auscultation, no crackles or wheezes. Heart:  Normal rate and regular rhythm. S1 and S2 normal without gallop, murmur, click, rub or other extra sounds. Abdomen:  Bowel sounds positive,abdomen soft and non-tender without masses, organomegaly or hernias noted. Msk:  No deformity or scoliosis noted of thoracic or lumbar spine.   Extremities:  No clubbing, cyanosis, edema, or deformity noted with normal full range of motion of all joints.   Neurologic:  alert & oriented X3 and gait normal.   Skin:  Intact without suspicious lesions or rashes Cervical Nodes:  No lymphadenopathy  noted Axillary Nodes:  No palpable lymphadenopathy Psych:  Cognition and judgment appear intact. Alert and cooperative with normal attention span and concentration. No apparent delusions, illusions, hallucinations      Assessment & Plan:   Medicare annual wellness visit, subsequent  Wrist fracture, right, sequela  Hypothyroidism, unspecified hypothyroidism type  Paroxysmal atrial fibrillation  ANXIETY DEPRESSION No Follow-up on file.

## 2015-04-04 NOTE — Assessment & Plan Note (Signed)
Rate and rhythm controlled on current rxs. Followed by cardiology. No changes made.

## 2015-04-04 NOTE — Assessment & Plan Note (Signed)
The patients weight, height, BMI and visual acuity have been recorded in the chart.  Cognitive function assessed.   I have made referrals, counseling and provided education to the patient based review of the above and I have provided the pt with a written personalized care plan for preventive services.  Pneumovax and influenza vaccines given today.

## 2015-04-04 NOTE — Progress Notes (Signed)
Pre visit review using our clinic review tool, if applicable. No additional management support is needed unless otherwise documented below in the visit note. 

## 2015-04-04 NOTE — Patient Instructions (Signed)
Great to see you.  Please drop off a copy of your Living will/forms.

## 2015-04-25 ENCOUNTER — Encounter: Payer: Self-pay | Admitting: Family Medicine

## 2015-04-25 ENCOUNTER — Ambulatory Visit (INDEPENDENT_AMBULATORY_CARE_PROVIDER_SITE_OTHER): Payer: Medicare Other | Admitting: Family Medicine

## 2015-04-25 VITALS — BP 122/72 | HR 67 | Temp 98.3°F | Wt 170.2 lb

## 2015-04-25 DIAGNOSIS — B349 Viral infection, unspecified: Secondary | ICD-10-CM

## 2015-04-25 NOTE — Assessment & Plan Note (Signed)
Afebrile and exam reassuring. Reassurance provided. Supportive care- fluids and tylenol as needed for fever and myalgias. Call or return to clinic prn if these symptoms worsen or fail to improve as anticipated. The patient indicates understanding of these issues and agrees with the plan.

## 2015-04-25 NOTE — Progress Notes (Signed)
Pre visit review using our clinic review tool, if applicable. No additional management support is needed unless otherwise documented below in the visit note. 

## 2015-04-25 NOTE — Progress Notes (Signed)
Subjective:   Patient ID: Monica Whitaker, female    DOB: Mar 29, 1946, 69 y.o.   MRN: [id number]  Monica Whitaker is a pleasant 69 y.o. year old female who presents to clinic today with Fever  on 04/25/2015  HPI:  Woke up yesterday with low grade temp- 99.1- "this is a fever for me." Achy all over. No runny nose, cough, shortness of breath, nausea or vomiting. No rash. No dysuria.  No abdominal pain.  No known sick contacts.  Current Outpatient Prescriptions on File Prior to Visit  Medication Sig Dispense Refill  . acetaminophen (TYLENOL) 500 MG tablet Take 2 tablets by mouth every 6 hours as needed for pain    . calcium carbonate (OS-CAL) 600 MG TABS Take 600 mg by mouth daily with breakfast.     . cholecalciferol (VITAMIN D) 1000 UNITS tablet Take 1,000 Units by mouth daily.      Marland Kitchen dextroamphetamine (DEXTROSTAT) 5 MG tablet Take 5 mg by mouth daily.     Marland Kitchen diltiazem (CARDIZEM CD) 180 MG 24 hr capsule Take 1 capsule (180 mg total) by mouth daily. 90 capsule 3  . DULoxetine (CYMBALTA) 30 MG capsule Take 1 capsule by mouth at bedtime.     . DULoxetine (CYMBALTA) 60 MG capsule Take 60 mg by mouth every morning.     . flecainide (TAMBOCOR) 100 MG tablet Take 1 tablet (100 mg total) by mouth 2 (two) times daily. 180 tablet 3  . levothyroxine (SYNTHROID, LEVOTHROID) 25 MCG tablet TAKE 1 TABLET EVERY DAY (Patient taking differently: TAKE 1 TABLET BY MOUTH EVERY DAY) 90 tablet 1  . Multiple Vitamin (MULTIVITAMIN) tablet Take 1 tablet by mouth daily.      . Oral Electrolytes (BUFFERED SALT PO) Take 1 capsule by mouth daily as needed (electrolyte supplement).     . Timolol Maleate (TIMOPTIC OP) Place 1 drop into the left eye daily.     . vitamin E 400 UNIT capsule Take 400 Units by mouth daily.      Alveda Reasons 20 MG TABS tablet TAKE 1 TABLET BY MOUTH EVERY DAY 90 tablet 1   No current facility-administered medications on file prior to visit.    Allergies  Allergen Reactions  . Darvon  Other (See Comments)    Severe panic attacks  . Propoxyphene N-Acetaminophen Other (See Comments)    Severe panic attacks  . Levofloxacin Anxiety and Other (See Comments)    Causes panic attacks.    Past Medical History  Diagnosis Date  . Anxiety   . Depression   . CVA (cerebral vascular accident) 12/2007  . Ventricular tachycardia     pt told at [facility] that she had RVOT VT  . Atrial fibrillation     paroxysmal, failed medical therapy with flecainide,  NSVT with tikosyn  . Thyroid disease   . Stroke   . Bruises easily     Past Surgical History  Procedure Laterality Date  . Vaginal hysterectomy    . Tonsillectomy    . Eye surgery    . Tee without cardioversion N/A 10/18/2012    Procedure: TRANSESOPHAGEAL ECHOCARDIOGRAM (TEE);  Surgeon: Lelon Perla, MD;  Location: [facility] ENDOSCOPY;  Service: Cardiovascular;  Laterality: N/A;  Pre-Ablation at 12pm  . Atrial fibrillation ablation  10/18/12    PVI by Dr Rayann Heman  . Atrial fibrillation ablation N/A 10/18/2012    Procedure: ATRIAL FIBRILLATION ABLATION;  Surgeon: Thompson Grayer, MD;  Location: [facility] CATH LAB;  Service: Cardiovascular;  Laterality:  N/A;  . Open reduction internal fixation (orif) distal radial fracture Right 11/15/2014    Procedure: OPEN REDUCTION INTERNAL FIXATION (ORIF) RIGHT DISTAL RADIAL FRACTURE WITH ALLOGRAFT BONE GRAFT;  Surgeon: Roseanne Kaufman, MD;  Location: [place];  Service: Orthopedics;  Laterality: Right;    Family History  Problem Relation Age of Onset  . Multiple sclerosis Father     Social History   Social History  . Marital Status: Married    Spouse Name: N/A  . Number of Children: 0  . Years of Education: N/A   Occupational History  . Retired    Social History Main Topics  . Smoking status: Never Smoker   . Smokeless tobacco: Never Used  . Alcohol Use: 0.5 oz/week    1 drink(s) per week     Comment: regular  . Drug Use: No  . Sexual Activity: Not on file   Other Topics  Concern  . Not on file   Social History Narrative   Lives in [place] with her husband.  No children.  Former Psychologist, prison and probation services      Has a living will-    Would desire CPR   Would not want prolonged life support if futile.   The PMH, PSH, Social History, Family History, Medications, and allergies have been reviewed in [facility], and have been updated if relevant.   Review of Systems  Constitutional: Positive for fever and fatigue. Negative for unexpected weight change.  HENT: Negative.   Eyes: Negative.   Respiratory: Negative.   Cardiovascular: Negative.   Gastrointestinal: Negative.   Endocrine: Negative.   Genitourinary: Negative.   Musculoskeletal: Positive for myalgias.  Allergic/Immunologic: Negative.   Neurological: Negative.   Hematological: Negative.   Psychiatric/Behavioral: Negative.   All other systems reviewed and are negative.      Objective:    BP 122/72 mmHg  Pulse 67  Temp(Src) 98.3 F (36.8 C) (Oral)  Wt 170 lb 4 oz (77.225 kg)  SpO2 91%   Physical Exam  Constitutional: She is oriented to person, place, and time. She appears well-developed and well-nourished. No distress.  HENT:  Head: Normocephalic.  Right Ear: External ear normal.  Left Ear: External ear normal.  Nose: Nose normal.  Mouth/Throat: No oropharyngeal exudate.  Neck: Normal range of motion.  Cardiovascular: Normal rate.   Pulmonary/Chest: Effort normal and breath sounds normal.  Abdominal: Soft.  Neurological: She is alert and oriented to person, place, and time. No cranial nerve deficit.  Skin: Skin is warm and dry.  Psychiatric: She has a normal mood and affect. Her behavior is normal. Judgment and thought content normal.  Nursing note and vitals reviewed.         Assessment & Plan:   Viral illness No Follow-up on file.

## 2015-05-06 DIAGNOSIS — M9901 Segmental and somatic dysfunction of cervical region: Secondary | ICD-10-CM | POA: Diagnosis not present

## 2015-05-06 DIAGNOSIS — M9902 Segmental and somatic dysfunction of thoracic region: Secondary | ICD-10-CM | POA: Diagnosis not present

## 2015-05-06 DIAGNOSIS — M791 Myalgia: Secondary | ICD-10-CM | POA: Diagnosis not present

## 2015-05-06 DIAGNOSIS — M530 Cervicocranial syndrome: Secondary | ICD-10-CM | POA: Diagnosis not present

## 2015-05-07 DIAGNOSIS — M530 Cervicocranial syndrome: Secondary | ICD-10-CM | POA: Diagnosis not present

## 2015-05-07 DIAGNOSIS — M9902 Segmental and somatic dysfunction of thoracic region: Secondary | ICD-10-CM | POA: Diagnosis not present

## 2015-05-07 DIAGNOSIS — M9901 Segmental and somatic dysfunction of cervical region: Secondary | ICD-10-CM | POA: Diagnosis not present

## 2015-05-07 DIAGNOSIS — M546 Pain in thoracic spine: Secondary | ICD-10-CM | POA: Diagnosis not present

## 2015-05-09 ENCOUNTER — Encounter: Payer: Self-pay | Admitting: Primary Care

## 2015-05-09 ENCOUNTER — Ambulatory Visit (INDEPENDENT_AMBULATORY_CARE_PROVIDER_SITE_OTHER): Payer: Medicare Other | Admitting: Primary Care

## 2015-05-09 VITALS — BP 116/70 | HR 67 | Temp 97.6°F | Ht 69.0 in | Wt 172.8 lb

## 2015-05-09 DIAGNOSIS — R350 Frequency of micturition: Secondary | ICD-10-CM | POA: Diagnosis not present

## 2015-05-09 DIAGNOSIS — R319 Hematuria, unspecified: Secondary | ICD-10-CM | POA: Diagnosis not present

## 2015-05-09 DIAGNOSIS — N390 Urinary tract infection, site not specified: Secondary | ICD-10-CM | POA: Diagnosis not present

## 2015-05-09 LAB — POCT URINALYSIS DIPSTICK
Bilirubin, UA: NEGATIVE
GLUCOSE UA: NEGATIVE
Ketones, UA: NEGATIVE
Nitrite, UA: NEGATIVE
PROTEIN UA: NEGATIVE
Spec Grav, UA: 1.015
UROBILINOGEN UA: NEGATIVE
pH, UA: 8.5

## 2015-05-09 MED ORDER — CEPHALEXIN 500 MG PO CAPS: 500.0000 mg | ORAL_CAPSULE | Freq: Two times a day (BID) | ORAL | Status: DC

## 2015-05-09 NOTE — Progress Notes (Signed)
Subjective:    Patient ID: Monica Whitaker, female    DOB: 13-Sep-1945, 70 y.o.   MRN: [id number]  HPI  Ms. Metzinger is a 69 year old female who presents today with a chief complaint of urinary frequency. She also reports dysuria and low grade fever. Her symptoms have been present since last night. She was in a 12 hour car ride yesterday coming home from [place]. Denies hematuria, nausea, abdominal pain. She has not taken anything OTC for her symptoms.   Review of Systems  Constitutional: Positive for fever. Negative for chills.  Gastrointestinal: Negative for nausea and abdominal pain.  Genitourinary: Positive for dysuria and frequency. Negative for flank pain and difficulty urinating.       Past Medical History  Diagnosis Date  . Anxiety   . Depression   . CVA (cerebral vascular accident) ([place]) 12/2007  . Ventricular tachycardia ([place])     pt told at [facility] that she had RVOT VT  . Atrial fibrillation ([name])     paroxysmal, failed medical therapy with flecainide,  NSVT with tikosyn  . Thyroid disease   . Stroke ([place])   . Bruises easily     Social History   Social History  . Marital Status: Married    Spouse Name: N/A  . Number of Children: 0  . Years of Education: N/A   Occupational History  . Retired    Social History Main Topics  . Smoking status: Never Smoker   . Smokeless tobacco: Never Used  . Alcohol Use: 0.5 oz/week    1 drink(s) per week     Comment: regular  . Drug Use: No  . Sexual Activity: Not on file   Other Topics Concern  . Not on file   Social History Narrative   Lives in [place] with her husband.  No children.  Former Psychologist, prison and probation services      Has a living will-    Would desire CPR   Would not want prolonged life support if futile.    Past Surgical History  Procedure Laterality Date  . Vaginal hysterectomy    . Tonsillectomy    . Eye surgery    . Tee without cardioversion N/A 10/18/2012    Procedure: TRANSESOPHAGEAL ECHOCARDIOGRAM (TEE);   Surgeon: Lelon Perla, MD;  Location: [facility] ENDOSCOPY;  Service: Cardiovascular;  Laterality: N/A;  Pre-Ablation at 12pm  . Atrial fibrillation ablation  10/18/12    PVI by Dr Rayann Heman  . Atrial fibrillation ablation N/A 10/18/2012    Procedure: ATRIAL FIBRILLATION ABLATION;  Surgeon: Thompson Grayer, MD;  Location: [facility] CATH LAB;  Service: Cardiovascular;  Laterality: N/A;  . Open reduction internal fixation (orif) distal radial fracture Right 11/15/2014    Procedure: OPEN REDUCTION INTERNAL FIXATION (ORIF) RIGHT DISTAL RADIAL FRACTURE WITH ALLOGRAFT BONE GRAFT;  Surgeon: Roseanne Kaufman, MD;  Location: [place] [facility];  Service: Orthopedics;  Laterality: Right;    Family History  Problem Relation Age of Onset  . Multiple sclerosis Father     Allergies  Allergen Reactions  . Darvon Other (See Comments)    Severe panic attacks  . Propoxyphene N-Acetaminophen Other (See Comments)    Severe panic attacks  . Levofloxacin Anxiety and Other (See Comments)    Causes panic attacks.    Current Outpatient Prescriptions on File Prior to Visit  Medication Sig Dispense Refill  . acetaminophen (TYLENOL) 500 MG tablet Take 2 tablets by mouth every 6 hours as needed for pain    .  calcium carbonate (OS-CAL) 600 MG TABS Take 600 mg by mouth daily with breakfast.     . cholecalciferol (VITAMIN D) 1000 UNITS tablet Take 1,000 Units by mouth daily.      Marland Kitchen dextroamphetamine (DEXTROSTAT) 5 MG tablet Take 5 mg by mouth daily.     Marland Kitchen diltiazem (CARDIZEM CD) 180 MG 24 hr capsule Take 1 capsule (180 mg total) by mouth daily. 90 capsule 3  . DULoxetine (CYMBALTA) 30 MG capsule Take 1 capsule by mouth at bedtime.     . DULoxetine (CYMBALTA) 60 MG capsule Take 60 mg by mouth every morning.     . flecainide (TAMBOCOR) 100 MG tablet Take 1 tablet (100 mg total) by mouth 2 (two) times daily. 180 tablet 3  . levothyroxine (SYNTHROID, LEVOTHROID) 25 MCG tablet TAKE 1 TABLET EVERY DAY (Patient taking differently: TAKE  1 TABLET BY MOUTH EVERY DAY) 90 tablet 1  . Multiple Vitamin (MULTIVITAMIN) tablet Take 1 tablet by mouth daily.      . Oral Electrolytes (BUFFERED SALT PO) Take 1 capsule by mouth daily as needed (electrolyte supplement).     . Timolol Maleate (TIMOPTIC OP) Place 1 drop into the left eye daily.     . vitamin E 400 UNIT capsule Take 400 Units by mouth daily.      Alveda Reasons 20 MG TABS tablet TAKE 1 TABLET BY MOUTH EVERY DAY 90 tablet 1   No current facility-administered medications on file prior to visit.    BP 116/70 mmHg  Pulse 67  Temp(Src) 97.6 F (36.4 C) (Oral)  Ht 5\' 9"  (1.753 m)  Wt 172 lb 12.8 oz (78.382 kg)  BMI 25.51 kg/m2  SpO2 97%    Objective:   Physical Exam  Constitutional: She appears well-nourished.  Cardiovascular: Normal rate and regular rhythm.   Pulmonary/Chest: Effort normal and breath sounds normal.  Abdominal: Soft. Bowel sounds are normal. There is tenderness in the suprapubic area. There is no CVA tenderness.  Skin: Skin is warm and dry.          Assessment & Plan:  Urinary tract infection:  Urinary frequency x 12 hours with dysuria. Recently returned home from a 12 hour car ride from [place]. UA: Positive for leuks, blood, protein. Negative for nitrites. Culture sent. RX for Keflex 500 mg BID x 7 days. Push fluids, rest. Follow up PRN.

## 2015-05-09 NOTE — Progress Notes (Signed)
Pre visit review using our clinic review tool, if applicable. No additional management support is needed unless otherwise documented below in the visit note. 

## 2015-05-09 NOTE — Patient Instructions (Signed)
Start Cephalexin 500 mg antibiotics for urinary tract infection. Take 1 tablet by mouth twice daily for 7 days.  Increase consumption of water.  Please notify me if no improvement in the next 3-4 days.  It was a pleasure to see you today!  Urinary Tract Infection Urinary tract infections (UTIs) can develop anywhere along your urinary tract. Your urinary tract is your body's drainage system for removing wastes and extra water. Your urinary tract includes two kidneys, two ureters, a bladder, and a urethra. Your kidneys are a pair of bean-shaped organs. Each kidney is about the size of your fist. They are located below your ribs, one on each side of your spine. CAUSES Infections are caused by microbes, which are microscopic organisms, including fungi, viruses, and bacteria. These organisms are so small that they can only be seen through a microscope. Bacteria are the microbes that most commonly cause UTIs. SYMPTOMS  Symptoms of UTIs may vary by age and gender of the patient and by the location of the infection. Symptoms in young women typically include a frequent and intense urge to urinate and a painful, burning feeling in the bladder or urethra during urination. Older women and men are more likely to be tired, shaky, and weak and have muscle aches and abdominal pain. A fever may mean the infection is in your kidneys. Other symptoms of a kidney infection include pain in your back or sides below the ribs, nausea, and vomiting. DIAGNOSIS To diagnose a UTI, your caregiver will ask you about your symptoms. Your caregiver will also ask you to provide a urine sample. The urine sample will be tested for bacteria and white blood cells. White blood cells are made by your body to help fight infection. TREATMENT  Typically, UTIs can be treated with medication. Because most UTIs are caused by a bacterial infection, they usually can be treated with the use of antibiotics. The choice of antibiotic and length of  treatment depend on your symptoms and the type of bacteria causing your infection. HOME CARE INSTRUCTIONS  If you were prescribed antibiotics, take them exactly as your caregiver instructs you. Finish the medication even if you feel better after you have only taken some of the medication.  Drink enough water and fluids to keep your urine clear or pale yellow.  Avoid caffeine, tea, and carbonated beverages. They tend to irritate your bladder.  Empty your bladder often. Avoid holding urine for long periods of time.  Empty your bladder before and after sexual intercourse.  After a bowel movement, women should cleanse from front to back. Use each tissue only once. SEEK MEDICAL CARE IF:   You have back pain.  You develop a fever.  Your symptoms do not begin to resolve within 3 days. SEEK IMMEDIATE MEDICAL CARE IF:   You have severe back pain or lower abdominal pain.  You develop chills.  You have nausea or vomiting.  You have continued burning or discomfort with urination. MAKE SURE YOU:   Understand these instructions.  Will watch your condition.  Will get help right away if you are not doing well or get worse.   This information is not intended to replace advice given to you by your health care provider. Make sure you discuss any questions you have with your health care provider.   Document Released: 04/29/2005 Document Revised: 04/10/2015 Document Reviewed: 08/28/2011 Elsevier Interactive Patient Education [employer].

## 2015-05-11 LAB — URINE CULTURE: Colony Count: 80000

## 2015-05-27 ENCOUNTER — Other Ambulatory Visit: Payer: Self-pay | Admitting: Family Medicine

## 2015-05-27 ENCOUNTER — Other Ambulatory Visit: Payer: Self-pay | Admitting: Internal Medicine

## 2015-05-28 ENCOUNTER — Other Ambulatory Visit: Payer: Self-pay | Admitting: Internal Medicine

## 2015-05-28 ENCOUNTER — Other Ambulatory Visit: Payer: Self-pay

## 2015-05-28 DIAGNOSIS — L219 Seborrheic dermatitis, unspecified: Secondary | ICD-10-CM | POA: Diagnosis not present

## 2015-05-28 DIAGNOSIS — B351 Tinea unguium: Secondary | ICD-10-CM | POA: Diagnosis not present

## 2015-05-28 MED ORDER — DILTIAZEM HCL ER COATED BEADS 180 MG PO CP24: 180.0000 mg | ORAL_CAPSULE | Freq: Every day | ORAL | Status: DC

## 2015-06-03 ENCOUNTER — Other Ambulatory Visit: Payer: Self-pay | Admitting: Internal Medicine

## 2015-06-03 ENCOUNTER — Other Ambulatory Visit: Payer: Self-pay | Admitting: Family Medicine

## 2015-06-03 DIAGNOSIS — Z1231 Encounter for screening mammogram for malignant neoplasm of breast: Secondary | ICD-10-CM

## 2015-06-06 ENCOUNTER — Telehealth: Payer: Self-pay | Admitting: Internal Medicine

## 2015-06-06 NOTE — Telephone Encounter (Signed)
Patient contacted and says that she has been in and out of a-fib 4 times and usually its a night. Patient said she took and extra diltiazem 180 mg last night and she went out of a-fib. Patient denies chest pain, dizziness or sob. Patient was given an appointment to be seen by Roderic Palau in the morning at 9:00 am. Patient advised that if her symptoms get worse that she needed to proceed to the ED for an evaluation. Patient verbalized understanding of plan. Code given to patient to get into a-fib department parking deck.

## 2015-06-06 NOTE — Telephone Encounter (Signed)
Pt in Afib since yesterday, stopped eating chocolate, cut down her wine from 2 glasses to one. Usually has Afib during the night, but this happened during the day. Pt asking if needs to make med changes, be seen sooner, or wait until 07-03-15 appt. Please advise

## 2015-06-07 ENCOUNTER — Encounter (HOSPITAL_COMMUNITY): Payer: Self-pay | Admitting: Nurse Practitioner

## 2015-06-07 ENCOUNTER — Other Ambulatory Visit: Payer: Self-pay

## 2015-06-07 ENCOUNTER — Ambulatory Visit (HOSPITAL_COMMUNITY)
Admission: RE | Admit: 2015-06-07 | Discharge: 2015-06-07 | Disposition: A | Discharge status: Home / Self Care | Payer: Medicare Other | Source: Ambulatory Visit | Attending: Nurse Practitioner | Admitting: Nurse Practitioner

## 2015-06-07 VITALS — BP 98/62 | HR 63 | Ht 69.0 in | Wt 170.2 lb

## 2015-06-07 DIAGNOSIS — I480 Paroxysmal atrial fibrillation: Secondary | ICD-10-CM | POA: Diagnosis not present

## 2015-06-07 NOTE — Progress Notes (Signed)
Patient ID: Monica Whitaker, female   DOB: Apr 26, 1946, 69 y.o.   MRN: [id number]     Primary Care Physician: Arnette Norris, MD Referring Physician: Dr. Sherryll Burger is a 69 y.o. female with a h/o PAF in the afib clinic for f/u at her request. She has had more afib burden over the last month and wanted to discuss options. States the afib she is experiencing is of shorter duration and is not disruptive to her lifestyle. She can still function, instead of having to go to bed like she use to.Is tolerating drug and feels well otherwise. No change in health or medications.Compliant with blood thinner.Failed tikosyn in the past due to NSVT. No issue with blood thinner. Ablation x 1 in 2014.  Today, she denies  chest pain, shortness of breath, orthopnea, PND, lower extremity edema, dizziness, presyncope, syncope, or neurologic sequela. Postive for intermittent irregular heart beat. The patient is tolerating medications without difficulties and is otherwise without complaint today.   Past Medical History  Diagnosis Date  . Anxiety   . Depression   . CVA (cerebral vascular accident) ([place]) 12/2007  . Ventricular tachycardia ([place])     pt told at [facility] that she had RVOT VT  . Atrial fibrillation ([name])     paroxysmal, failed medical therapy with flecainide,  NSVT with tikosyn  . Thyroid disease   . Stroke ([place])   . Bruises easily    Past Surgical History  Procedure Laterality Date  . Vaginal hysterectomy    . Tonsillectomy    . Eye surgery    . Tee without cardioversion N/A 10/18/2012    Procedure: TRANSESOPHAGEAL ECHOCARDIOGRAM (TEE);  Surgeon: Lelon Perla, MD;  Location: [facility] ENDOSCOPY;  Service: Cardiovascular;  Laterality: N/A;  Pre-Ablation at 12pm  . Atrial fibrillation ablation  10/18/12    PVI by Dr Rayann Heman  . Atrial fibrillation ablation N/A 10/18/2012    Procedure: ATRIAL FIBRILLATION ABLATION;  Surgeon: Thompson Grayer, MD;  Location: [facility] CATH LAB;  Service: Cardiovascular;   Laterality: N/A;  . Open reduction internal fixation (orif) distal radial fracture Right 11/15/2014    Procedure: OPEN REDUCTION INTERNAL FIXATION (ORIF) RIGHT DISTAL RADIAL FRACTURE WITH ALLOGRAFT BONE GRAFT;  Surgeon: Roseanne Kaufman, MD;  Location: [facility];  Service: Orthopedics;  Laterality: Right;    Current Outpatient Prescriptions  Medication Sig Dispense Refill  . acetaminophen (TYLENOL) 500 MG tablet Take 2 tablets by mouth every 6 hours as needed for pain    . Calcium Carb-Cholecalciferol (CALCIUM 1000 + D PO) Take 1 tablet by mouth daily.    . cholecalciferol (VITAMIN D) 1000 UNITS tablet Take 1,000 Units by mouth daily.      Marland Kitchen dextroamphetamine (DEXTROSTAT) 5 MG tablet Take 5 mg by mouth daily.     Marland Kitchen diltiazem (CARDIZEM CD) 180 MG 24 hr capsule Take 1 capsule (180 mg total) by mouth daily. 90 capsule 3  . DULoxetine (CYMBALTA) 30 MG capsule Take 1 capsule by mouth at bedtime.     . DULoxetine (CYMBALTA) 60 MG capsule Take 60 mg by mouth every morning.     . flecainide (TAMBOCOR) 100 MG tablet Take 1 tablet (100 mg total) by mouth 2 (two) times daily. 180 tablet 3  . levothyroxine (SYNTHROID, LEVOTHROID) 25 MCG tablet TAKE 1 TABLET BY MOUTH EVERY DAY 90 tablet 2  . Multiple Vitamin (MULTIVITAMIN) tablet Take 1 tablet by mouth daily.      . Oral Electrolytes (BUFFERED SALT PO)  Take 1 capsule by mouth daily as needed (electrolyte supplement).     . Timolol Maleate (TIMOPTIC OP) Place 1 drop into the left eye daily.     . vitamin E 400 UNIT capsule Take 400 Units by mouth daily.      Alveda Reasons 20 MG TABS tablet TAKE 1 TABLET BY MOUTH EVERY DAY 90 tablet 1  . calcium carbonate (OS-CAL) 600 MG TABS Take 600 mg by mouth daily with breakfast.     . cephALEXin (KEFLEX) 500 MG capsule Take 1 capsule (500 mg total) by mouth 2 (two) times daily. 14 capsule 0   No current facility-administered medications for this encounter.    Allergies  Allergen Reactions  . Darvon Other  (See Comments)    Severe panic attacks  . Propoxyphene N-Acetaminophen Other (See Comments)    Severe panic attacks  . Levofloxacin Anxiety and Other (See Comments)    Causes panic attacks.    Social History   Social History  . Marital Status: Married    Spouse Name: N/A  . Number of Children: 0  . Years of Education: N/A   Occupational History  . Retired    Social History Main Topics  . Smoking status: Never Smoker   . Smokeless tobacco: Never Used  . Alcohol Use: 0.5 oz/week    1 drink(s) per week     Comment: regular  . Drug Use: No  . Sexual Activity: Not on file   Other Topics Concern  . Not on file   Social History Narrative   Lives in [place] with her husband.  No children.  Former Psychologist, prison and probation services      Has a living will-    Would desire CPR   Would not want prolonged life support if futile.    Family History  Problem Relation Age of Onset  . Multiple sclerosis Father     ROS- All systems are reviewed and negative except as per the HPI above  Physical Exam: Filed Vitals:   06/07/15 0911  BP: 98/62  Pulse: 63  Height: 5\' 9"  (1.753 m)  Weight: 170 lb 3.2 oz (77.202 kg)    GEN- The patient is well appearing, alert and oriented x 3 today.   Head- normocephalic, atraumatic Eyes-  Sclera clear, conjunctiva pink Ears- hearing intact Oropharynx- clear Neck- supple, no JVP Lymph- no cervical lymphadenopathy Lungs- Clear to ausculation bilaterally, normal work of breathing Heart- Regular rate and rhythm, no murmurs, rubs or gallops, PMI not laterally displaced GI- soft, NT, ND, + BS Extremities- no clubbing, cyanosis, or edema MS- no significant deformity or atrophy Skin- no rash or lesion Psych- euthymic mood, full affect Neuro- strength and sensation are intact  EKG-NSR at 63 bpm, Print 150 ms, QRS int 76 ms, QTc 390 ms Epic records reviewed  Assessment and Plan: 1. PAF Has done  very well on flecainide without any noticeable afib burden   until last week. Pt was reassured, possibly just situational. Will have to see afib burden going forward. Continue with diltiazem Continue xarelto 20 mg daily Last bmet in April with crcl calculated at 14ml/min   2.H/o hypotension Stable Uses salt tablets as needed   F/u 11/30 with Dr. Rayann Heman as already scheduled to discuss options if afib burden continues to be increased, most likely another ablation, sotalol or  amiodarone   Butch Penny C. Makoa Satz, [facility] [address] [place], [place] [address] [phone]

## 2015-06-11 ENCOUNTER — Telehealth: Payer: Self-pay | Admitting: Internal Medicine

## 2015-06-11 NOTE — Telephone Encounter (Addendum)
10AM - patient called back stating that over last week her AFib burden keeps increasing and lasting for few hours instead of just few minutes.  It doesn't seem to incapacitate her as it did in the past but she notices when she goes into afib. Wanting to discuss other options if this continues to happen.  Told patient I would discuss with Roderic Palau NP and return her call.    415PM Talked with Dr. Rayann Heman - recommended she have a peak flecainide level drawn 2 hours after dose of daily flecainide and if acceptable will increase daily flecainide level to 150mg  twice daily and follow up with him as scheduled.  Left message for patient to return my call to discuss. -- patient to come in 11/10 for blood draw.

## 2015-06-11 NOTE — Telephone Encounter (Signed)
Left message for patient to return my call.

## 2015-06-11 NOTE — Telephone Encounter (Signed)
Pt calling re being in afib again-pt wants a call as to what to do-pls call

## 2015-06-13 ENCOUNTER — Ambulatory Visit
Admission: RE | Admit: 2015-06-13 | Discharge: 2015-06-13 | Disposition: A | Discharge status: Home / Self Care | Payer: Medicare Other | Source: Ambulatory Visit | Attending: Family Medicine | Admitting: Family Medicine

## 2015-06-13 ENCOUNTER — Ambulatory Visit (HOSPITAL_COMMUNITY)
Admission: RE | Admit: 2015-06-13 | Discharge: 2015-06-13 | Disposition: A | Discharge status: Home / Self Care | Payer: Medicare Other | Source: Ambulatory Visit | Attending: Nurse Practitioner | Admitting: Nurse Practitioner

## 2015-06-13 ENCOUNTER — Other Ambulatory Visit: Payer: Self-pay | Admitting: Family Medicine

## 2015-06-13 DIAGNOSIS — I4891 Unspecified atrial fibrillation: Secondary | ICD-10-CM | POA: Diagnosis not present

## 2015-06-13 DIAGNOSIS — Z1231 Encounter for screening mammogram for malignant neoplasm of breast: Secondary | ICD-10-CM

## 2015-06-13 DIAGNOSIS — I480 Paroxysmal atrial fibrillation: Secondary | ICD-10-CM

## 2015-06-15 LAB — FLECAINIDE LEVEL: Flecainide: 0.57 ug/mL (ref 0.20–1.00)

## 2015-06-17 ENCOUNTER — Other Ambulatory Visit (HOSPITAL_COMMUNITY): Payer: Medicare Other | Admitting: Nurse Practitioner

## 2015-06-18 ENCOUNTER — Telehealth (HOSPITAL_COMMUNITY): Payer: Self-pay | Admitting: *Deleted

## 2015-06-18 NOTE — Telephone Encounter (Signed)
Notified patient regarding flecainide level within range to increase flecainide.  Patient reports she has had no more breakthrough afib since having blood drawn and does not want to increase her flecainide at this time. Instructed patient to call back if further breakthrough afib and will increase at that time if needed. Patient verbalized understanding and agreed with this plan.

## 2015-06-29 ENCOUNTER — Other Ambulatory Visit: Payer: Self-pay | Admitting: Internal Medicine

## 2015-07-03 ENCOUNTER — Encounter: Payer: Self-pay | Admitting: Internal Medicine

## 2015-07-03 ENCOUNTER — Ambulatory Visit (INDEPENDENT_AMBULATORY_CARE_PROVIDER_SITE_OTHER): Payer: Medicare Other | Admitting: Internal Medicine

## 2015-07-03 VITALS — BP 100/64 | HR 67 | Ht 69.0 in | Wt 173.6 lb

## 2015-07-03 DIAGNOSIS — I480 Paroxysmal atrial fibrillation: Secondary | ICD-10-CM | POA: Diagnosis not present

## 2015-07-03 NOTE — Patient Instructions (Signed)
Medication Instructions:  Your physician recommends that you continue on your current medications as directed. Please refer to the Current Medication list given to you today.   Labwork: None ordered   Testing/Procedures: None ordered   Follow-Up: Your physician wants you to follow-up in: 6 months with Donna Carroll, NP and 12 months with Dr Allred You will receive a reminder letter in the mail two months in advance. If you don't receive a letter, please call our office to schedule the follow-up appointment.   Any Other Special Instructions Will Be Listed Below (If Applicable).     If you need a refill on your cardiac medications before your next appointment, please call your pharmacy.   

## 2015-07-04 NOTE — Progress Notes (Signed)
Electrophysiology Office Note   Date:  07/04/2015   ID:  Monica Whitaker, DOB 09/18/1945, MRN [id number]  PCP:  Arnette Norris, MD  Primary Electrophysiologist: Thompson Grayer, MD    Chief Complaint  Patient presents with  . Persistent AFIB     History of Present Illness: Monica Whitaker is a 69 y.o. female who presents today for electrophysiology evaluation.  She seems to be doing well.  She had some afib recently and was seen in the AF clinic. She has since had quiescence of her afib.  Presently, she does not wish to make any changes.  Today, she denies symptoms of  chest pain, shortness of breath, orthopnea, PND, lower extremity edema, claudication, dizziness, presyncope, syncope, bleeding, or neurologic sequela. The patient is tolerating medications without difficulties and is otherwise without complaint today.    Past Medical History  Diagnosis Date  . Anxiety   . Depression   . CVA (cerebral vascular accident) (Monica Whitaker) 12/2007  . Ventricular tachycardia ([place])     pt told at [facility] that she had RVOT VT  . Atrial fibrillation ([name])     paroxysmal, failed medical therapy with flecainide,  NSVT with tikosyn  . Thyroid disease   . Stroke (Monica Whitaker)   . Bruises easily    Past Surgical History  Procedure Laterality Date  . Vaginal hysterectomy    . Tonsillectomy    . Eye surgery    . Tee without cardioversion N/A 10/18/2012    Procedure: TRANSESOPHAGEAL ECHOCARDIOGRAM (TEE);  Surgeon: Lelon Perla, MD;  Location: [facility] ENDOSCOPY;  Service: Cardiovascular;  Laterality: N/A;  Pre-Ablation at 12pm  . Atrial fibrillation ablation  10/18/12    PVI by Dr Rayann Heman  . Atrial fibrillation ablation N/A 10/18/2012    Procedure: ATRIAL FIBRILLATION ABLATION;  Surgeon: Thompson Grayer, MD;  Location: [name] CATH LAB;  Service: Cardiovascular;  Laterality: N/A;  . Open reduction internal fixation (orif) distal radial fracture Right 11/15/2014    Procedure: OPEN REDUCTION INTERNAL FIXATION (ORIF) RIGHT DISTAL  RADIAL FRACTURE WITH ALLOGRAFT BONE GRAFT;  Surgeon: Roseanne Kaufman, MD;  Location: [place];  Service: Orthopedics;  Laterality: Right;     Current Outpatient Prescriptions  Medication Sig Dispense Refill  . acetaminophen (TYLENOL) 500 MG tablet Take 2 tablets by mouth every 6 hours as needed for pain    . calcium carbonate (OS-CAL) 600 MG TABS Take 600 mg by mouth daily with breakfast.     . cholecalciferol (VITAMIN D) 1000 UNITS tablet Take 1,000 Units by mouth daily.      Marland Kitchen dextroamphetamine (DEXTROSTAT) 5 MG tablet Take 5 mg by mouth daily.     Marland Kitchen diltiazem (CARDIZEM CD) 180 MG 24 hr capsule Take 1 capsule (180 mg total) by mouth daily. 90 capsule 3  . DULoxetine (CYMBALTA) 30 MG capsule Take 1 capsule by mouth at bedtime.     . DULoxetine (CYMBALTA) 60 MG capsule Take 60 mg by mouth every morning.     . flecainide (TAMBOCOR) 100 MG tablet Take 1 tablet (100 mg total) by mouth 2 (two) times daily. 180 tablet 3  . levothyroxine (SYNTHROID, LEVOTHROID) 25 MCG tablet TAKE 1 TABLET BY MOUTH EVERY DAY 90 tablet 2  . Multiple Vitamin (MULTIVITAMIN) tablet Take 1 tablet by mouth daily.      . Oral Electrolytes (BUFFERED SALT PO) Take 1 capsule by mouth daily as needed (Electrolyte/Salt supplement).     . Timolol Maleate (TIMOPTIC OP) Place 1 drop into the  left eye daily.     . vitamin E 400 UNIT capsule Take 400 Units by mouth daily.      Alveda Reasons 20 MG TABS tablet TAKE 1 TABLET BY MOUTH EVERY DAY 90 tablet 1   No current facility-administered medications for this visit.    Allergies:   Darvon; Propoxyphene n-acetaminophen; and Levofloxacin   Social History:  The patient  reports that she has never smoked. She has never used smokeless tobacco. She reports that she drinks about 0.5 oz of alcohol per week. She reports that she does not use illicit drugs.   Family History:  The patient's  family history includes Multiple sclerosis in her father.    ROS:  Please see the  history of present illness.   All other systems are reviewed and negative.    PHYSICAL EXAM: VS:  BP 100/64 mmHg  Pulse 67  Ht 5\' 9"  (1.753 m)  Wt 173 lb 9.6 oz (78.744 kg)  BMI 25.62 kg/m2 , BMI Body mass index is 25.62 kg/(m^2). GEN: Well nourished, well developed, in no acute distress HEENT: normal Neck: no JVD, carotid bruits, or masses Cardiac: RRR; no murmurs, rubs, or gallops,no edema  Respiratory:  clear to auscultation bilaterally, normal work of breathing GI: soft, nontender, nondistended, + BS MS: R wrist is in a splint Skin: warm and dry  Neuro:  Strength and sensation are intact Psych: euthymic mood, full affect  EKG:  EKG is ordered today. The ekg ordered today shows sinus rhythm    Recent Labs: 04/01/2015: ALT 24; BUN 16; Creatinine, Ser 0.67; Hemoglobin 13.4; Platelets 363.0; Potassium 4.3; Sodium 140; TSH 1.80    Lipid Panel     Component Value Date/Time   CHOL 208* 04/01/2015 0956   TRIG 53.0 04/01/2015 0956   HDL 84.80 04/01/2015 0956   CHOLHDL 2 04/01/2015 0956   VLDL 10.6 04/01/2015 0956   LDLCALC 112* 04/01/2015 0956   LDLDIRECT 123.5 09/18/2013 0844     Wt Readings from Last 3 Encounters:  07/03/15 173 lb 9.6 oz (78.744 kg)  06/07/15 170 lb 3.2 oz (77.202 kg)  05/09/15 172 lb 12.8 oz (78.382 kg)     ASSESSMENT AND PLAN:  1. Persistent Afib, s/p PVI 10/08/12.  Continues to have occasional afib Currently she feels that she would like to continue her current treatment strategy.  IF she has additional afib, she may consider repeat ablation. Continue xarelto (Chads2vasc 4)   F/u in afib clinic in 6 months. I will see again in 12 months     Current medicines are reviewed at length with the patient today.   The patient does not have concerns regarding her medicines.  The following changes were made today:  none  Signed, Thompson Grayer, MD  07/04/2015 9:44 PM     [place]  [address] [phone] (office) [phone] (fax)

## 2015-07-11 DIAGNOSIS — H40003 Preglaucoma, unspecified, bilateral: Secondary | ICD-10-CM | POA: Diagnosis not present

## 2015-07-23 DIAGNOSIS — Z961 Presence of intraocular lens: Secondary | ICD-10-CM | POA: Diagnosis not present

## 2015-10-30 ENCOUNTER — Emergency Department (HOSPITAL_COMMUNITY): Payer: Medicare Other

## 2015-10-30 ENCOUNTER — Emergency Department (HOSPITAL_COMMUNITY)
Admission: EM | Admit: 2015-10-30 | Discharge: 2015-10-30 | Disposition: A | Discharge status: Home / Self Care | Payer: Medicare Other | Attending: Emergency Medicine | Admitting: Emergency Medicine

## 2015-10-30 ENCOUNTER — Encounter (HOSPITAL_COMMUNITY): Payer: Self-pay | Admitting: Emergency Medicine

## 2015-10-30 DIAGNOSIS — Z8673 Personal history of transient ischemic attack (TIA), and cerebral infarction without residual deficits: Secondary | ICD-10-CM | POA: Diagnosis not present

## 2015-10-30 DIAGNOSIS — S63276A Dislocation of unspecified interphalangeal joint of right little finger, initial encounter: Secondary | ICD-10-CM | POA: Diagnosis not present

## 2015-10-30 DIAGNOSIS — S0083XA Contusion of other part of head, initial encounter: Secondary | ICD-10-CM

## 2015-10-30 DIAGNOSIS — Z79899 Other long term (current) drug therapy: Secondary | ICD-10-CM | POA: Insufficient documentation

## 2015-10-30 DIAGNOSIS — Z23 Encounter for immunization: Secondary | ICD-10-CM | POA: Insufficient documentation

## 2015-10-30 DIAGNOSIS — S62115A Nondisplaced fracture of triquetrum [cuneiform] bone, left wrist, initial encounter for closed fracture: Secondary | ICD-10-CM | POA: Insufficient documentation

## 2015-10-30 DIAGNOSIS — S0990XA Unspecified injury of head, initial encounter: Secondary | ICD-10-CM | POA: Diagnosis not present

## 2015-10-30 DIAGNOSIS — E079 Disorder of thyroid, unspecified: Secondary | ICD-10-CM | POA: Diagnosis not present

## 2015-10-30 DIAGNOSIS — Y998 Other external cause status: Secondary | ICD-10-CM | POA: Insufficient documentation

## 2015-10-30 DIAGNOSIS — Z7901 Long term (current) use of anticoagulants: Secondary | ICD-10-CM | POA: Diagnosis not present

## 2015-10-30 DIAGNOSIS — I480 Paroxysmal atrial fibrillation: Secondary | ICD-10-CM | POA: Insufficient documentation

## 2015-10-30 DIAGNOSIS — Y92481 Parking lot as the place of occurrence of the external cause: Secondary | ICD-10-CM | POA: Diagnosis not present

## 2015-10-30 DIAGNOSIS — W01198A Fall on same level from slipping, tripping and stumbling with subsequent striking against other object, initial encounter: Secondary | ICD-10-CM | POA: Diagnosis not present

## 2015-10-30 DIAGNOSIS — S62336A Displaced fracture of neck of fifth metacarpal bone, right hand, initial encounter for closed fracture: Secondary | ICD-10-CM | POA: Diagnosis not present

## 2015-10-30 DIAGNOSIS — R220 Localized swelling, mass and lump, head: Secondary | ICD-10-CM | POA: Diagnosis not present

## 2015-10-30 DIAGNOSIS — F329 Major depressive disorder, single episode, unspecified: Secondary | ICD-10-CM | POA: Insufficient documentation

## 2015-10-30 DIAGNOSIS — Z9889 Other specified postprocedural states: Secondary | ICD-10-CM | POA: Insufficient documentation

## 2015-10-30 DIAGNOSIS — Y9389 Activity, other specified: Secondary | ICD-10-CM | POA: Diagnosis not present

## 2015-10-30 DIAGNOSIS — S6992XA Unspecified injury of left wrist, hand and finger(s), initial encounter: Secondary | ICD-10-CM | POA: Diagnosis present

## 2015-10-30 DIAGNOSIS — S62112A Displaced fracture of triquetrum [cuneiform] bone, left wrist, initial encounter for closed fracture: Secondary | ICD-10-CM

## 2015-10-30 DIAGNOSIS — S63259A Unspecified dislocation of unspecified finger, initial encounter: Secondary | ICD-10-CM

## 2015-10-30 DIAGNOSIS — S60212A Contusion of left wrist, initial encounter: Secondary | ICD-10-CM | POA: Insufficient documentation

## 2015-10-30 DIAGNOSIS — F419 Anxiety disorder, unspecified: Secondary | ICD-10-CM | POA: Insufficient documentation

## 2015-10-30 DIAGNOSIS — S0181XA Laceration without foreign body of other part of head, initial encounter: Secondary | ICD-10-CM | POA: Diagnosis not present

## 2015-10-30 DIAGNOSIS — S199XXA Unspecified injury of neck, initial encounter: Secondary | ICD-10-CM | POA: Diagnosis not present

## 2015-10-30 DIAGNOSIS — S63256A Unspecified dislocation of right little finger, initial encounter: Secondary | ICD-10-CM | POA: Diagnosis not present

## 2015-10-30 MED ORDER — LIDOCAINE HCL 2 % IJ SOLN
10.0000 mL | Freq: Once | INTRAMUSCULAR | Status: AC
  Administered 2015-10-30: 200 mg
  Filled 2015-10-30: qty 20

## 2015-10-30 MED ORDER — HYDROCODONE-ACETAMINOPHEN 5-325 MG PO TABS: 2.0000 | ORAL_TABLET | ORAL | Status: DC | PRN

## 2015-10-30 MED ORDER — TETANUS-DIPHTH-ACELL PERTUSSIS 5-2.5-18.5 LF-MCG/0.5 IM SUSP
0.5000 mL | Freq: Once | INTRAMUSCULAR | Status: AC
  Administered 2015-10-30: 0.5 mL via INTRAMUSCULAR
  Filled 2015-10-30: qty 0.5

## 2015-10-30 NOTE — ED Notes (Signed)
Pt c/o fall in [place] today c/o hitting face and 5th digit on right hand deformity with laceration; pt with bruising to face and left wrist

## 2015-10-30 NOTE — Progress Notes (Signed)
Orthopedic Tech Progress Note Patient Details:  Monica Whitaker 1945/11/11 [id number] Applied fiberglass volar splint to LUE.  Pulses, sensation, motion intact before and after splinting.  Capillary refill less than 2 seconds before and after splinting.  Applied static aluminum/foam splint to Rt. 5th finger.  Motion and sensation intact before and after splinting.  Capillary refill less than 2 seconds before and after splinting. Ortho Devices Type of Ortho Device: Volar splint, Finger splint Ortho Device/Splint Location: Volar splint to LUE.  Finger splint to Rt. 5th finger. Ortho Device/Splint Interventions: Application   Darrol Poke 10/30/2015, 8:54 PM

## 2015-10-30 NOTE — ED Provider Notes (Signed)
CSN: [id number]     Arrival date & time 10/30/15  1429 History  By signing my name below, I, Eustaquio Maize, attest that this documentation has been prepared under the direction and in the presence of Alyse Low, [place].  Electronically Signed: Eustaquio Maize, ED Scribe. 10/30/2015. 4:17 PM.   Chief Complaint  Patient presents with  . Fall   The history is provided by the patient. No language interpreter was used.     HPI Comments: Monica Whitaker is a 70 y.o. female with PMHx atrial fibrillation on Xarelto, who presents to the Emergency Department complaining of sudden onset, constant, right 5th digit pain s/p ground level fall that occurred earlier today. Pt tripped and fell in the [place] parking lot, hitting her 5th digit in the process. Pt has obvious deformity to the finger. She mentions hitting the left side of her face upon impact but denies LOC. She also complains of left wrist pain. Pt took left over narcotic medication PTA with relief from the pain. She is currently on Xarelto from previous CVA. Tetanus status unknown. Denies neck pain, back pain, or any other associated symptoms.   Past Medical History  Diagnosis Date  . Anxiety   . Depression   . CVA (cerebral vascular accident) ([place]) 12/2007  . 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 Atrial fibrillation ([name])     paroxysmal, failed medical therapy with flecainide,  NSVT with tikosyn  . Thyroid disease   . Stroke ([place])   . Bruises easily    Past Surgical History  Procedure Laterality Date  . Vaginal hysterectomy    . Tonsillectomy    . Eye surgery    . Tee without cardioversion N/A 10/18/2012    Procedure: TRANSESOPHAGEAL ECHOCARDIOGRAM (TEE);  Surgeon: Lelon Perla, MD;  Location: [facility] ENDOSCOPY;  Service: Cardiovascular;  Laterality: N/A;  Pre-Ablation at 12pm  . Atrial fibrillation ablation  10/18/12    PVI by Dr Rayann Heman  . Atrial fibrillation ablation N/A 10/18/2012    Procedure:  ATRIAL FIBRILLATION ABLATION;  Surgeon: Thompson Grayer, MD;  Location: [facility] CATH LAB;  Service: Cardiovascular;  Laterality: N/A;  . Open reduction internal fixation (orif) distal radial fracture Right 11/15/2014    Procedure: OPEN REDUCTION INTERNAL FIXATION (ORIF) RIGHT DISTAL RADIAL FRACTURE WITH ALLOGRAFT BONE GRAFT;  Surgeon: Roseanne Kaufman, MD;  Location: [place];  Service: Orthopedics;  Laterality: Right;   Family History  Problem Relation Age of Onset  . Multiple sclerosis Father    Social History  Substance Use Topics  . Smoking status: Never Smoker   . Smokeless tobacco: Never Used  . Alcohol Use: 0.5 oz/week    1 drink(s) per week     Comment: regular   OB History    No data available     Review of Systems  Musculoskeletal: Positive for arthralgias (Right 5th digit. Left wrist. ). Negative for back pain and neck pain.  Neurological: Negative for syncope.  All other systems reviewed and are negative.   Allergies  Darvon; Propoxyphene n-acetaminophen; and Levofloxacin  Home Medications   Prior to Admission medications   Medication Sig Start Date End Date Taking? Authorizing Provider  acetaminophen (TYLENOL) 500 MG tablet Take 2 tablets by mouth every 6 hours as needed for pain    Historical Provider, MD  calcium carbonate (OS-CAL) 600 MG TABS Take 600 mg by mouth daily with breakfast.     Historical  Provider, MD  cholecalciferol (VITAMIN D) 1000 UNITS tablet Take 1,000 Units by mouth daily.      Historical Provider, MD  dextroamphetamine (DEXTROSTAT) 5 MG tablet Take 5 mg by mouth daily.     Historical Provider, MD  diltiazem (CARDIZEM CD) 180 MG 24 hr capsule Take 1 capsule (180 mg total) by mouth daily. 05/28/15   Thompson Grayer, MD  DULoxetine (CYMBALTA) 30 MG capsule Take 1 capsule by mouth at bedtime.  11/23/14   Historical Provider, MD  DULoxetine (CYMBALTA) 60 MG capsule Take 60 mg by mouth every morning.     Historical Provider, MD  flecainide  (TAMBOCOR) 100 MG tablet Take 1 tablet (100 mg total) by mouth 2 (two) times daily. 12/10/14   Thompson Grayer, MD  levothyroxine (SYNTHROID, LEVOTHROID) 25 MCG tablet TAKE 1 TABLET BY MOUTH EVERY DAY 05/27/15   Lucille Passy, MD  Multiple Vitamin (MULTIVITAMIN) tablet Take 1 tablet by mouth daily.      Historical Provider, MD  Oral Electrolytes (BUFFERED SALT PO) Take 1 capsule by mouth daily as needed (Electrolyte/Salt supplement).     Historical Provider, MD  Timolol Maleate (TIMOPTIC OP) Place 1 drop into the left eye daily.     Historical Provider, MD  vitamin E 400 UNIT capsule Take 400 Units by mouth daily.      Historical Provider, MD  XARELTO 20 MG TABS tablet TAKE 1 TABLET BY MOUTH EVERY DAY 07/01/15   Thompson Grayer, MD   BP 123/70 mmHg  Pulse 69  Temp(Src) 98.1 F (36.7 C) (Oral)  Resp 18  SpO2 96%   Physical Exam  Constitutional: She is oriented to person, place, and time. She appears well-developed and well-nourished. No distress.  HENT:  Head: Normocephalic.  Right 5th finger obvious dislocated pointing laterally. Abrasion left forehead. Hematoma left forehead.  Eyes: Conjunctivae and EOM are normal.  Neck: Neck supple. No tracheal deviation present.  Cardiovascular: Normal rate.   Pulmonary/Chest: Effort normal. No respiratory distress.  Musculoskeletal: Normal range of motion.  Right 5th finger obvious dislocated pointing laterally. Hematoma left wrist. Full ROM of left wrist. NVI. No sensory deficits.   Neurological: She is alert and oriented to person, place, and time.  Skin: Skin is warm and dry.  Psychiatric: She has a normal mood and affect. Her behavior is normal.  Nursing note and vitals reviewed.   ED Course  Reduction of dislocation Date/Time: 10/30/2015 9:11 PM Performed by: Fransico Meadow Authorized by: Fransico Meadow Consent: Verbal consent obtained. Consent given by: patient Time out: Immediately prior to procedure a "time out" was called to verify the  correct patient, procedure, equipment, support staff and site/side marked as required. Preparation: Patient was prepped and draped in the usual sterile fashion. Local anesthesia used: yes Anesthesia: digital block Local anesthetic: lidocaine 2% without epinephrine Patient sedated: no Patient tolerance: Patient tolerated the procedure well with no immediate complications Comments: Dislocation of pip reduced with gentle traction.   Good alignment visually,   Xray shows reduction of dislocation   (including critical care time)  DIAGNOSTIC STUDIES: Oxygen Saturation is 96% on RA, normal by my interpretation.    COORDINATION OF CARE: 4:11 PM-Discussed treatment plan which includes CT Head, DG R Hand with pt at bedside and pt agreed to plan.   Labs Review Labs Reviewed - No data to display  Imaging Review Dg Wrist Complete Left  10/30/2015  CLINICAL DATA:  Fall with left wrist pain and bruising, deformity of right little  finger, initial encounter. EXAM: LEFT WRIST - COMPLETE 3+ VIEW COMPARISON:  None. FINDINGS: There is a small avulsed osseous fragment along the posterior aspect of the wrist. No additional evidence of an acute fracture. IMPRESSION: Nondisplaced triquetral avulsion fracture. Electronically Signed   By: Lorin Picket M.D.   On: 10/30/2015 17:19   Ct Head Wo Contrast  10/30/2015  CLINICAL DATA:  Fall, hit left side of the head, face swelling EXAM: CT HEAD WITHOUT CONTRAST CT CERVICAL SPINE WITHOUT CONTRAST TECHNIQUE: Multidetector CT imaging of the head and cervical spine was performed following the standard protocol without intravenous contrast. Multiplanar CT image reconstructions of the cervical spine were also generated. COMPARISON:  11/09/2014 FINDINGS: CT HEAD FINDINGS No skull fracture is noted. Paranasal sinuses shows mild mucosal thickening with partial opacification bilateral posterior ethmoid air cells. No intracranial hemorrhage, mass effect or midline shift. Stable old  infarct and encephalomalacia in left occipital lobe. No acute cortical infarction. Ventricular size is stable from prior exam. No mass lesion is noted on this unenhanced scan. There is soft tissue swelling left face and left zygomatic region. No zygomatic fracture is noted. No paranasal sinuses air-fluid levels. CT CERVICAL SPINE FINDINGS Axial images of the cervical spine shows no acute fracture or subluxation. Computer processed images shows no acute fracture or subluxation. Mild degenerative changes C1-C2 articulation. Mild disc space flattening with minimal posterior spurring at C3-C4 level. Mild disc space flattening with mild anterior and mild posterior spurring at C4-C5, C5-C6 and C6-C7 level. No prevertebral soft tissue swelling. Cervical airway is patent. There is no pneumothorax in visualized lung apices. IMPRESSION: 1. No acute intracranial abnormality. Stable old infarct and encephalomalacia left occipital lobe. There is left face and left zygomatic region soft tissue swelling. 2. No cervical spine acute fracture or subluxation. Mild degenerative changes as described above. Electronically Signed   By: Lahoma Crocker M.D.   On: 10/30/2015 17:10   Ct Cervical Spine Wo Contrast  10/30/2015  CLINICAL DATA:  Fall, hit left side of the head, face swelling EXAM: CT HEAD WITHOUT CONTRAST CT CERVICAL SPINE WITHOUT CONTRAST TECHNIQUE: Multidetector CT imaging of the head and cervical spine was performed following the standard protocol without intravenous contrast. Multiplanar CT image reconstructions of the cervical spine were also generated. COMPARISON:  11/09/2014 FINDINGS: CT HEAD FINDINGS No skull fracture is noted. Paranasal sinuses shows mild mucosal thickening with partial opacification bilateral posterior ethmoid air cells. No intracranial hemorrhage, mass effect or midline shift. Stable old infarct and encephalomalacia in left occipital lobe. No acute cortical infarction. Ventricular size is stable from  prior exam. No mass lesion is noted on this unenhanced scan. There is soft tissue swelling left face and left zygomatic region. No zygomatic fracture is noted. No paranasal sinuses air-fluid levels. CT CERVICAL SPINE FINDINGS Axial images of the cervical spine shows no acute fracture or subluxation. Computer processed images shows no acute fracture or subluxation. Mild degenerative changes C1-C2 articulation. Mild disc space flattening with minimal posterior spurring at C3-C4 level. Mild disc space flattening with mild anterior and mild posterior spurring at C4-C5, C5-C6 and C6-C7 level. No prevertebral soft tissue swelling. Cervical airway is patent. There is no pneumothorax in visualized lung apices. IMPRESSION: 1. No acute intracranial abnormality. Stable old infarct and encephalomalacia left occipital lobe. There is left face and left zygomatic region soft tissue swelling. 2. No cervical spine acute fracture or subluxation. Mild degenerative changes as described above. Electronically Signed   By: Orlean Bradford.D.  On: 10/30/2015 17:10   Dg Finger Little Right  10/30/2015  CLINICAL DATA:  Postreduction of small finger dislocation. EXAM: RIGHT LITTLE FINGER 2+V COMPARISON:  Earlier the same date. FINDINGS: 1843 hours. The dislocation at the fifth PIP joint has been reduced. The previously questioned small ossific density along the volar aspect of the proximal phalangeal head is not well seen, although the lateral view is limited. There is dorsal soft tissue swelling in the hand with a stable mildly displaced intra-articular fracture involving the base of the fifth metacarpal. There is a small amount of soft tissue emphysema within the ulnar soft tissues. IMPRESSION: 1. The fifth PIP joint dislocation has been reduced. No definite associated fracture fragment identified on these views, although the lateral view is limited. 2. Mildly displaced intra-articular fracture involving the fifth metacarpal base.  Electronically Signed   By: Richardean Sale M.D.   On: 10/30/2015 19:06   Dg Finger Little Right  10/30/2015  CLINICAL DATA:  Fall with right fifth finger deformity EXAM: RIGHT LITTLE FINGER 2+V COMPARISON:  None. FINDINGS: There is lateral dislocation of the right fifth finger at the level of the proximal interphalangeal joint. There is a tiny 1-2 mm bone fragment at the volar margin of the distal portion of the proximal phalanx in the right fifth finger, which could represent a tiny avulsion fracture fragment. Partially visualized is a proximal right fifth metacarpal fracture on these views. Mild osteoarthritis in the distal interphalangeal joint of the right fifth finger. IMPRESSION: 1. Lateral right fifth finger dislocation at the level of the PIP joint. 2. Probable tiny avulsion fracture fragment in the volar soft tissues at the level of the distal aspect of the proximal phalanx in the right fifth finger. 3. Partially visualized proximal right fifth metacarpal fracture on these views, recommend dedicated right hand radiographs for further evaluation. Electronically Signed   By: Ilona Sorrel M.D.   On: 10/30/2015 17:23   I have personally reviewed and evaluated these images and lab results as part of my medical decision-making.   EKG Interpretation None      MDM  steristrips to facial laceration,  Splint to right fifth finger, splint wrist.   Pt has seen Dr. Amedeo Plenty in the past.  Pt advised to see Dr. Amedeo Plenty for recheck.    Final diagnoses:  Laceration of forehead, initial encounter  Contusion of forehead, initial encounter  Dislocation, finger closed, initial encounter  Fracture, triquetral bone, left, closed, initial encounter     I personally performed the services in this documentation, which was scribed in my presence.  The recorded information has been reviewed and considered.   Ronnald Collum.     Hollace Kinnier [place], PA-C 10/30/15 2114  Leonard Schwartz, MD 10/31/15 2133

## 2015-10-30 NOTE — Discharge Instructions (Signed)
Contusion A contusion is a deep bruise. Contusions are the result of a blunt injury to tissues and muscle fibers under the skin. The injury causes bleeding under the skin. The skin overlying the contusion may turn blue, purple, or yellow. Minor injuries will give you a painless contusion, but more severe contusions may stay painful and swollen for a few weeks.  CAUSES  This condition is usually caused by a blow, trauma, or direct force to an area of the body. SYMPTOMS  Symptoms of this condition include:  Swelling of the injured area.  Pain and tenderness in the injured area.  Discoloration. The area may have redness and then turn blue, purple, or yellow. DIAGNOSIS  This condition is diagnosed based on a physical exam and medical history. An X-ray, CT scan, or MRI may be needed to determine if there are any associated injuries, such as broken bones (fractures). TREATMENT  Specific treatment for this condition depends on what area of the body was injured. In general, the best treatment for a contusion is resting, icing, applying pressure to (compression), and elevating the injured area. This is often called the RICE strategy. Over-the-counter anti-inflammatory medicines may also be recommended for pain control.  HOME CARE INSTRUCTIONS   Rest the injured area.  If directed, apply ice to the injured area:  Put ice in a plastic bag.  Place a towel between your skin and the bag.  Leave the ice on for 20 minutes, 2-3 times per day.  If directed, apply light compression to the injured area using an elastic bandage. Make sure the bandage is not wrapped too tightly. Remove and reapply the bandage as directed by your health care provider.  If possible, raise (elevate) the injured area above the level of your heart while you are sitting or lying down.  Take over-the-counter and prescription medicines only as told by your health care provider. SEEK MEDICAL CARE IF:  Your symptoms do not  improve after several days of treatment.  Your symptoms get worse.  You have difficulty moving the injured area. SEEK IMMEDIATE MEDICAL CARE IF:   You have severe pain.  You have numbness in a hand or foot.  Your hand or foot turns pale or cold.   This information is not intended to replace advice given to you by your health care provider. Make sure you discuss any questions you have with your health care provider.   Document Released: 04/29/2005 Document Revised: 04/10/2015 Document Reviewed: 12/05/2014 Elsevier Interactive Patient Education 2016 [place]. Sterile Tape Wound Care Some cuts and wounds can be closed using sterile tape, also called skin adhesive strips. Skin adhesive strips can be used for shallow (superficial) and simple cuts, wounds, lacerations, and surgical incisions. These strips act in place of stitches to hold the edges of the wound together, allowing for faster healing. Unlike stitches, the adhesive strips do not require needles or anesthetic medicine for placement. The strips will wear off naturally as the wound is healing. It is important to take proper care of your wound at home while it heals.  HOME CARE INSTRUCTIONS  Try to keep the area around your wound clean and dry. Do not allow the adhesive strips to get wet for the first 12 hours.   Do not use any soaps or ointments on the wound for the first 12 hours.   If a bandage (dressing) has been applied, follow your health care provider's instructions for how often to change the dressing. Keep the dressing dry  if one has been applied.   Do not remove the adhesive strips. They will fall off on their own. If they do not, you may remove them gently after 10 days. You should gently wet the strips before removing them. For example, this can be done in the shower.  Do not scratch, pick, or rub the wound area.   Protect the wound from further injury until it is healed.   Protect the wound from sun and  tanning bed exposure while it is healing and for several weeks after healing.   Only take over-the-counter or prescription medicines as directed by your health care provider.   Keep all follow-up appointments as directed by your health care provider.  SEEK MEDICAL CARE IF: Your adhesive strips become wet or soaked with blood before the wound has healed. The tape will need to be replaced.  SEEK IMMEDIATE MEDICAL CARE IF:  You have increasing pain in the wound.   You develop a rash after the strips are applied.  Your wound becomes red, swollen, hot, or tender.   You have a red streak that goes away from the wound.   You have pus coming from the wound.   You have increased bleeding from the wound.  You notice a bad smell coming from the wound.   Your wound breaks open. MAKE SURE YOU:  Understand these instructions.  Will watch your condition.  Will get help right away if you are not doing well or get worse.   This information is not intended to replace advice given to you by your health care provider. Make sure you discuss any questions you have with your health care provider.   Document Released: 08/27/2004 Document Revised: 08/10/2014 Document Reviewed: 02/08/2013 Elsevier Interactive Patient Education 2016 [employer] Wrist Fracture A wrist fracture is a break or crack in one of the bones of your wrist. Your wrist is made up of eight small bones at the palm of your hand (carpal bones) and two long bones that make up your forearm (radius and ulna). CAUSES  A direct blow to the wrist.  Falling on an outstretched hand.  Trauma, such as a car accident or a fall. RISK FACTORS Risk factors for wrist fracture include:  Participating in contact and high-risk sports, such as skiing, biking, and ice skating.  Taking steroid medicines.  Smoking.  Being female.  Being Caucasian.  Drinking more than three alcoholic beverages per day.  Having low or lowered  bone density (osteoporosis or osteopenia).  Age. Older adults have decreased bone density.  Women who have had menopause.  History of previous fractures. SIGNS AND SYMPTOMS Symptoms of wrist fractures include tenderness, bruising, and inflammation. Additionally, the wrist may hang in an odd position or appear deformed. DIAGNOSIS Diagnosis may include:  Physical exam.  X-ray. TREATMENT Treatment depends on many factors, including the nature and location of the fracture, your age, and your activity level. Treatment for wrist fracture can be nonsurgical or surgical. Nonsurgical Treatment A plaster cast or splint may be applied to your wrist if the bone is in a good position. If the fracture is not in good position, it may be necessary for your health care provider to realign it before applying a splint or cast. Usually, a cast or splint will be worn for several weeks. Surgical Treatment Sometimes the position of the bone is so far out of place that surgery is required to apply a device to hold it together as it heals. Depending on  the fracture, there are a number of options for holding the bone in place while it heals, such as a cast and metal pins. HOME CARE INSTRUCTIONS  Keep your injured wrist elevated and move your fingers as much as possible.  Do not put pressure on any part of your cast or splint. It may break.  Use a plastic bag to protect your cast or splint from water while bathing or showering. Do not lower your cast or splint into water.  Take medicines only as directed by your health care provider.  Keep your cast or splint clean and dry. If it becomes wet, damaged, or suddenly feels too tight, contact your health care provider right away.  Do not use any tobacco products including cigarettes, chewing tobacco, or electronic cigarettes. Tobacco can delay bone healing. If you need help quitting, ask your health care provider.  Keep all follow-up visits as directed by your  health care provider. This is important.  Ask your health care provider if you should take supplements of calcium and vitamins C and D to promote bone healing. SEEK MEDICAL CARE IF:  Your cast or splint is damaged, breaks, or gets wet.  You have a fever.  You have chills.  You have continued severe pain or more swelling than you did before the cast was put on. SEEK IMMEDIATE MEDICAL CARE IF:  Your hand or fingernails on the injured arm turn blue or gray, or feel cold or numb.  You have decreased feeling in the fingers of your injured arm. MAKE SURE YOU:  Understand these instructions.  Will watch your condition.  Will get help right away if you are not doing well or get worse.   This information is not intended to replace advice given to you by your health care provider. Make sure you discuss any questions you have with your health care provider.   Document Released: 04/29/2005 Document Revised: 04/10/2015 Document Reviewed: 08/07/2011 Elsevier Interactive Patient Education 2016 Elsevier Inc. Finger Dislocation Finger dislocation is the displacement of bones in your finger at the joints. Most commonly, finger dislocation occurs at the proximal interphalangeal joint (the joint closest to your knuckle). Very strong, fibrous tissues (ligaments) and joint capsules connect the three bones of your fingers.  CAUSES Dislocation is caused by a forceful impact. This impact moves these bones off the joint and often tears your ligaments.  SYMPTOMS Symptoms of finger dislocation include:  Deformity of your finger.  Pain, with loss of movement. DIAGNOSIS  Finger dislocation is diagnosed with a physical exam. Often, X-ray exams are done to see if you have associated injuries, such as bone fractures. TREATMENT  Finger dislocations are treated by putting your bones back into position (reduction) either by manually moving the bones back into place or through surgery. Your finger is then kept  in a fixed position (immobilized) with the use of a dressing or splint for a brief period. When your ligament has to be surgically repaired, it needs to be kept in a fixed position with a dressing or splint for 1 to 2 weeks. Because joint stiffness is a long-term complication of finger dislocation, hand exercises or physical therapy to increase the range of motion and to regain strength is usually started as soon as the ligament is healed. Exercises and therapy generally last no more than 3 months. HOME CARE INSTRUCTIONS The following measures can help to reduce pain and speed up the healing process:  Rest your injured joint. Do not move until instructed  otherwise by your caregiver. Avoid activities similar to the one that caused your injury.  Apply ice to your injured joint for the first day or 2 after your reduction or as directed by your caregiver. Applying ice helps to reduce inflammation and pain.  Put ice in a plastic bag.  Place a towel between your skin and the bag.  Leave the ice on for 15-20 minutes at a time, every 2 hours while you are awake.  Elevate your hand above your heart as directed by your caregiver to reduce swelling.  Take over-the-counter or prescription medicine for pain as your caregiver instructs you. SEEK IMMEDIATE MEDICAL CARE IF:  Your dressing or splint becomes damaged.  Your pain becomes worse rather than better.  You lose feeling in your finger, or it becomes cold and white. MAKE SURE YOU:  Understand these instructions.  Will watch your condition.  Will get help right away if you are not doing well or get worse.   This information is not intended to replace advice given to you by your health care provider. Make sure you discuss any questions you have with your health care provider.   Document Released: 07/17/2000 Document Revised: 08/10/2014 Document Reviewed: 12/14/2014 Elsevier Interactive Patient Education [employer].

## 2015-11-04 DIAGNOSIS — S62344A Nondisplaced fracture of base of fourth metacarpal bone, right hand, initial encounter for closed fracture: Secondary | ICD-10-CM | POA: Diagnosis not present

## 2015-11-04 DIAGNOSIS — S63286A Dislocation of proximal interphalangeal joint of right little finger, initial encounter: Secondary | ICD-10-CM | POA: Diagnosis not present

## 2015-11-04 DIAGNOSIS — S60212A Contusion of left wrist, initial encounter: Secondary | ICD-10-CM | POA: Diagnosis not present

## 2015-11-04 DIAGNOSIS — M25532 Pain in left wrist: Secondary | ICD-10-CM | POA: Diagnosis not present

## 2015-11-04 DIAGNOSIS — M79644 Pain in right finger(s): Secondary | ICD-10-CM | POA: Diagnosis not present

## 2015-11-06 ENCOUNTER — Encounter: Payer: Self-pay | Admitting: Family Medicine

## 2015-11-06 ENCOUNTER — Ambulatory Visit (INDEPENDENT_AMBULATORY_CARE_PROVIDER_SITE_OTHER): Payer: Medicare Other | Admitting: Family Medicine

## 2015-11-06 VITALS — BP 116/70 | HR 69 | Temp 98.4°F | Wt 170.2 lb

## 2015-11-06 DIAGNOSIS — S62609D Fracture of unspecified phalanx of unspecified finger, subsequent encounter for fracture with routine healing: Secondary | ICD-10-CM

## 2015-11-06 DIAGNOSIS — W19XXXA Unspecified fall, initial encounter: Secondary | ICD-10-CM | POA: Insufficient documentation

## 2015-11-06 DIAGNOSIS — S62102A Fracture of unspecified carpal bone, left wrist, initial encounter for closed fracture: Secondary | ICD-10-CM | POA: Insufficient documentation

## 2015-11-06 DIAGNOSIS — S62102D Fracture of unspecified carpal bone, left wrist, subsequent encounter for fracture with routine healing: Secondary | ICD-10-CM

## 2015-11-06 DIAGNOSIS — S62609A Fracture of unspecified phalanx of unspecified finger, initial encounter for closed fracture: Secondary | ICD-10-CM | POA: Insufficient documentation

## 2015-11-06 DIAGNOSIS — W19XXXD Unspecified fall, subsequent encounter: Secondary | ICD-10-CM

## 2015-11-06 NOTE — Assessment & Plan Note (Signed)
With multiple fractures. She is now followed by Dr. Amedeo Plenty.  Has another appt for next Friday. Pain well controlled. No surgery indicated.

## 2015-11-06 NOTE — Progress Notes (Signed)
Pre visit review using our clinic review tool, if applicable. No additional management support is needed unless otherwise documented below in the visit note. 

## 2015-11-06 NOTE — Progress Notes (Signed)
Subjective:   Patient ID: Monica Whitaker, female    DOB: 19-Jun-1946, 70 y.o.   MRN: [id number]  Monica Whitaker is a pleasant 70 y.o. year old female who presents to clinic today with Hospitalization Follow-up  on 11/06/2015  HPI: ER notes reviewed.  Was seen in ER on 10/30/15 after she tripped and fell in the [employer] parking lot, hitting her right 5th digit and face during the fall.  She also complained of left wrist pain.  On exam, left 5th digit appeared obviously dislocated.  It was reduced and xray was done:  EXAM: RIGHT LITTLE FINGER 2+V  COMPARISON: Earlier the same date.  FINDINGS: 1843 hours. The dislocation at the fifth PIP joint has been reduced. The previously questioned small ossific density along the volar aspect of the proximal phalangeal head is not well seen, although the lateral view is limited.  There is dorsal soft tissue swelling in the hand with a stable mildly displaced intra-articular fracture involving the base of the fifth metacarpal. There is a small amount of soft tissue emphysema within the ulnar soft tissues.  IMPRESSION: 1. The fifth PIP joint dislocation has been reduced. No definite associated fracture fragment identified on these views, although the lateral view is limited. 2. Mildly displaced intra-articular fracture involving the fifth metacarpal base.  Left wrist xray showed a small non displaced avulsion fracture:  EXAM: LEFT WRIST - COMPLETE 3+ VIEW  COMPARISON: None.  FINDINGS: There is a small avulsed osseous fragment along the posterior aspect of the wrist. No additional evidence of an acute fracture.  IMPRESSION: Nondisplaced triquetral avulsion fracture.  Head and CT neg for acute findings: CT HEAD WITHOUT CONTRAST  CT CERVICAL SPINE WITHOUT CONTRAST  TECHNIQUE: Multidetector CT imaging of the head and cervical spine was performed following the standard protocol without intravenous contrast.  Multiplanar CT image reconstructions of the cervical spine were also generated.  COMPARISON: 11/09/2014  FINDINGS: CT HEAD FINDINGS  No skull fracture is noted. Paranasal sinuses shows mild mucosal thickening with partial opacification bilateral posterior ethmoid air cells.  No intracranial hemorrhage, mass effect or midline shift. Stable old infarct and encephalomalacia in left occipital lobe. No acute cortical infarction. Ventricular size is stable from prior exam. No mass lesion is noted on this unenhanced scan. There is soft tissue swelling left face and left zygomatic region. No zygomatic fracture is noted. No paranasal sinuses air-fluid levels.  CT CERVICAL SPINE FINDINGS  Axial images of the cervical spine shows no acute fracture or subluxation. Computer processed images shows no acute fracture or subluxation. Mild degenerative changes C1-C2 articulation. Mild disc space flattening with minimal posterior spurring at C3-C4 level. Mild disc space flattening with mild anterior and mild posterior spurring at C4-C5, C5-C6 and C6-C7 level. No prevertebral soft tissue swelling. Cervical airway is patent.  There is no pneumothorax in visualized lung apices.  IMPRESSION: 1. No acute intracranial abnormality. Stable old infarct and encephalomalacia left occipital lobe. There is left face and left zygomatic region soft tissue swelling. 2. No cervical spine acute fracture or subluxation. Mild degenerative changes as described above.   Wrist splint and fifth digit splint applied and advised to follow up with Dr. Amedeo Plenty whom she has seen in the past. She saw his PA on Monday (2 days).  Placed in cast for right wrist and splint for left wrist.  Advised no surgery was warranted.  Feels pain is well controlled.  She does have help now coming to the home twice a  week which has also helped.  Current Outpatient Prescriptions on File Prior to Visit  Medication Sig  Dispense Refill  . acetaminophen (TYLENOL) 500 MG tablet Take 2 tablets by mouth every 6 hours as needed for pain    . calcium carbonate (OS-CAL) 600 MG TABS Take 600 mg by mouth daily with breakfast.     . cholecalciferol (VITAMIN D) 1000 UNITS tablet Take 1,000 Units by mouth daily.      Marland Kitchen dextroamphetamine (DEXTROSTAT) 5 MG tablet Take 5 mg by mouth daily.     Marland Kitchen diltiazem (CARDIZEM CD) 180 MG 24 hr capsule Take 1 capsule (180 mg total) by mouth daily. 90 capsule 3  . DULoxetine (CYMBALTA) 30 MG capsule Take 1 capsule by mouth at bedtime.     . DULoxetine (CYMBALTA) 60 MG capsule Take 60 mg by mouth every morning.     . flecainide (TAMBOCOR) 100 MG tablet Take 1 tablet (100 mg total) by mouth 2 (two) times daily. 180 tablet 3  . HYDROcodone-acetaminophen (NORCO/VICODIN) 5-325 MG tablet Take 2 tablets by mouth every 4 (four) hours as needed. 20 tablet 0  . levothyroxine (SYNTHROID, LEVOTHROID) 25 MCG tablet TAKE 1 TABLET BY MOUTH EVERY DAY 90 tablet 2  . Multiple Vitamin (MULTIVITAMIN) tablet Take 1 tablet by mouth daily.      . Oral Electrolytes (BUFFERED SALT PO) Take 1 capsule by mouth daily as needed (Electrolyte/Salt supplement).     . Timolol Maleate (TIMOPTIC OP) Place 1 drop into the left eye daily.     . vitamin E 400 UNIT capsule Take 400 Units by mouth daily.      Monica Whitaker 20 MG TABS tablet TAKE 1 TABLET BY MOUTH EVERY DAY 90 tablet 1   No current facility-administered medications on file prior to visit.    Allergies  Allergen Reactions  . Darvon Other (See Comments)    Severe panic attacks  . Propoxyphene N-Acetaminophen Other (See Comments)    Severe panic attacks  . Levofloxacin Anxiety and Other (See Comments)    Causes panic attacks.    Past Medical History  Diagnosis Date  . Anxiety   . Depression   . CVA (cerebral vascular accident) ([place]) 12/2007  . Ventricular tachycardia ([place])     pt told at [facility] that she had RVOT VT  . Atrial fibrillation ([name])      paroxysmal, failed medical therapy with flecainide,  NSVT with tikosyn  . Thyroid disease   . Stroke ([place])   . Bruises easily     Past Surgical History  Procedure Laterality Date  . Vaginal hysterectomy    . Tonsillectomy    . Eye surgery    . Tee without cardioversion N/A 10/18/2012    Procedure: TRANSESOPHAGEAL ECHOCARDIOGRAM (TEE);  Surgeon: Lelon Perla, MD;  Location: [facility] ENDOSCOPY;  Service: Cardiovascular;  Laterality: N/A;  Pre-Ablation at 12pm  . Atrial fibrillation ablation  10/18/12    PVI by Dr Rayann Heman  . Atrial fibrillation ablation N/A 10/18/2012    Procedure: ATRIAL FIBRILLATION ABLATION;  Surgeon: Thompson Grayer, MD;  Location: [facility] CATH LAB;  Service: Cardiovascular;  Laterality: N/A;  . Open reduction internal fixation (orif) distal radial fracture Right 11/15/2014    Procedure: OPEN REDUCTION INTERNAL FIXATION (ORIF) RIGHT DISTAL RADIAL FRACTURE WITH ALLOGRAFT BONE GRAFT;  Surgeon: Roseanne Kaufman, MD;  Location: [facility];  Service: Orthopedics;  Laterality: Right;    Family History  Problem Relation Age of Onset  . Multiple sclerosis Father  Social History   Social History  . Marital Status: Married    Spouse Name: N/A  . Number of Children: 0  . Years of Education: N/A   Occupational History  . Retired    Social History Main Topics  . Smoking status: Never Smoker   . Smokeless tobacco: Never Used  . Alcohol Use: 0.5 oz/week    1 drink(s) per week     Comment: regular  . Drug Use: No  . Sexual Activity: Not on file   Other Topics Concern  . Not on file   Social History Narrative   Lives in [place] with her husband.  No children.  Former Psychologist, prison and probation services      Has a living will-    Would desire CPR   Would not want prolonged life support if futile.   The PMH, PSH, Social History, Family History, Medications, and allergies have been reviewed in [facility], and have been updated if relevant.   Review of Systems  HENT: Negative.     Respiratory: Negative.   Cardiovascular: Negative.   Gastrointestinal: Negative.   Musculoskeletal: Positive for arthralgias.  Neurological: Negative.   Hematological: Negative.   Psychiatric/Behavioral: Negative.   All other systems reviewed and are negative.      Objective:    BP 116/70 mmHg  Pulse 69  Temp(Src) 98.4 F (36.9 C) (Oral)  Wt 170 lb 4 oz (77.225 kg)  SpO2 98%   Physical Exam  Constitutional: She is oriented to person, place, and time. She appears well-developed and well-nourished. No distress.  echymosis left cheek  HENT:  Head: Normocephalic.  Eyes: Conjunctivae are normal.  Cardiovascular: Normal rate.   Musculoskeletal:  Cast on right arm/ Splint on right pinky  Brace on left wrist  Neurological: She is alert and oriented to person, place, and time.  Skin: Skin is warm and dry. She is not diaphoretic.  Psychiatric: She has a normal mood and affect. Her behavior is normal. Judgment and thought content normal.  Nursing note and vitals reviewed.         Assessment & Plan:   Fall, subsequent encounter  Left wrist fracture, with routine healing, subsequent encounter  Finger fracture, with routine healing, subsequent encounter No Follow-up on file.

## 2015-11-14 DIAGNOSIS — S60212A Contusion of left wrist, initial encounter: Secondary | ICD-10-CM | POA: Diagnosis not present

## 2015-11-14 DIAGNOSIS — S63286A Dislocation of proximal interphalangeal joint of right little finger, initial encounter: Secondary | ICD-10-CM | POA: Diagnosis not present

## 2015-11-29 DIAGNOSIS — S60212D Contusion of left wrist, subsequent encounter: Secondary | ICD-10-CM | POA: Diagnosis not present

## 2015-11-29 DIAGNOSIS — Z4789 Encounter for other orthopedic aftercare: Secondary | ICD-10-CM | POA: Diagnosis not present

## 2015-11-29 DIAGNOSIS — S63286D Dislocation of proximal interphalangeal joint of right little finger, subsequent encounter: Secondary | ICD-10-CM | POA: Diagnosis not present

## 2015-11-29 DIAGNOSIS — M25532 Pain in left wrist: Secondary | ICD-10-CM | POA: Diagnosis not present

## 2015-11-29 DIAGNOSIS — S62346D Nondisplaced fracture of base of fifth metacarpal bone, right hand, subsequent encounter for fracture with routine healing: Secondary | ICD-10-CM | POA: Diagnosis not present

## 2015-12-16 ENCOUNTER — Encounter: Payer: Self-pay | Admitting: Family Medicine

## 2015-12-16 ENCOUNTER — Ambulatory Visit (INDEPENDENT_AMBULATORY_CARE_PROVIDER_SITE_OTHER): Payer: Medicare Other | Admitting: Family Medicine

## 2015-12-16 VITALS — BP 100/66 | HR 77 | Temp 98.3°F | Ht 69.0 in | Wt 171.5 lb

## 2015-12-16 DIAGNOSIS — S60212D Contusion of left wrist, subsequent encounter: Secondary | ICD-10-CM | POA: Diagnosis not present

## 2015-12-16 DIAGNOSIS — M25511 Pain in right shoulder: Secondary | ICD-10-CM

## 2015-12-16 DIAGNOSIS — S63286D Dislocation of proximal interphalangeal joint of right little finger, subsequent encounter: Secondary | ICD-10-CM | POA: Diagnosis not present

## 2015-12-16 DIAGNOSIS — M7501 Adhesive capsulitis of right shoulder: Secondary | ICD-10-CM

## 2015-12-16 NOTE — Progress Notes (Signed)
Pre visit review using our clinic review tool, if applicable. No additional management support is needed unless otherwise documented below in the visit note. 

## 2015-12-16 NOTE — Progress Notes (Signed)
Dr. Frederico Hamman T. Diandra Cimini, MD, [facility] [place], [address] Phone: [phone] Fax: [phone]  12/16/2015  Patient: Monica Whitaker, MRN: [id number], DOB: 1946-06-30, 70 y.o.  Primary Physician:  Arnette Norris, MD  Chief Complaint: Shoulder Pain  Subjective:   Monica GWIAZDOWSKI is a 70 y.o. very pleasant female patient who presents with the following:  The patient is known well, after I saw her with a frozen shoulder a little bit over a year ago, and she has intermittently fallen twice and had some very significant upper extremity fractures.  She is being mannered right now by Dr. Amedeo Plenty.  [place] again recently. B wrist - Recent R PIP dislocation, 5th fx Triquetrum fx on L 5th MCP base fracture as well Her records of been reviewed, and she is currently in splints bilaterally.  Has gotten frozen shoulder again. She has been working on her shoulder range of motion, and except for internal range of motion her stiffness is been improving.  Also she was trying to walk with some walking sticks the other day and when she was pushing on them she felt some significant amount of pain in her shoulder and ever since then she has had more pain in the shoulder  12/12/2014 Last OV with Owens Loffler, MD  Frozen shoulder resolved.  R foot feels tingling and numb.   She is here for her follow-up on her frozen shoulder, which is essentially fully better at this point. She thinks that this got better within about 2 or 3 weeks if seeing me. Unfortunately she fell and had a complex distal radius and ulna fracture that required operative fixation, and she is still recovering from this. She is 4 weeks postoperative at this point.   10/17/2014 Last OV with Owens Loffler, MD  RHD - started about 3 months ago and a month ago it got really bad. Yoga and core will hurt it a whole lot. IROM hurts it a lot more. She has no known specific injury that she  can recall.  Patient is right-hand dominant.  About 3 months ago, the patient started to have an indolent onset of some shoulder pain, and she started to have more and more pain with abduction and internal range of motion.  She also has some pain in a T-shirt distribution, and she has pain at night when she rolls on that side.  She has never had any dislocation or prior operations in the affected shoulder.  CVA 6 years ago. Does water aerobics, yoga, core work. Even with all that, she does not walk right and will get some lower back pain  Past Medical History, Surgical History, Social History, Family History, Problem List, Medications, and Allergies have been reviewed and updated if relevant.  GEN: No fevers, chills. Nontoxic. Primarily MSK c/o today. MSK: Detailed in the HPI GI: tolerating PO intake without difficulty Neuro: No numbness, parasthesias, or tingling associated. Otherwise the pertinent positives of the ROS are noted above.   Objective:   BP 100/66 mmHg  Pulse 77  Temp(Src) 98.3 F (36.8 C) (Oral)  Ht 5\' 9"  (1.753 m)  Wt 171 lb 8 oz (77.792 kg)  BMI 25.31 kg/m2   GEN: WDWN, NAD, Non-toxic, Alert & Oriented x 3 HEENT: Atraumatic, Normocephalic.  Ears and Nose: No external deformity. EXTR: No clubbing/cyanosis/edema NEURO: Normal gait.  PSYCH: Normally interactive. Conversant. Not depressed or anxious appearing.  Calm demeanor.   Right shoulder: Nontender  along the clavicle at the a.c. Joint, and on the proximal humerus.  Flexion, abduction, and external rotation are full with 5/5 strength.  4+ to 5 in abduction. Internal range of motion lacks 50 compared to the contralateral side.  She does have a positive Neer test and a positive Hawkins says. Drop test is negative  Radiology: Dg Wrist Complete Left  10/30/2015  CLINICAL DATA:  Fall with left wrist pain and bruising, deformity of right little finger, initial encounter. EXAM: LEFT WRIST - COMPLETE 3+ VIEW  COMPARISON:  None. FINDINGS: There is a small avulsed osseous fragment along the posterior aspect of the wrist. No additional evidence of an acute fracture. IMPRESSION: Nondisplaced triquetral avulsion fracture. Electronically Signed   By: Lorin Picket M.D.   On: 10/30/2015 17:19   Ct Head Wo Contrast  10/30/2015  CLINICAL DATA:  Fall, hit left side of the head, face swelling EXAM: CT HEAD WITHOUT CONTRAST CT CERVICAL SPINE WITHOUT CONTRAST TECHNIQUE: Multidetector CT imaging of the head and cervical spine was performed following the standard protocol without intravenous contrast. Multiplanar CT image reconstructions of the cervical spine were also generated. COMPARISON:  11/09/2014 FINDINGS: CT HEAD FINDINGS No skull fracture is noted. Paranasal sinuses shows mild mucosal thickening with partial opacification bilateral posterior ethmoid air cells. No intracranial hemorrhage, mass effect or midline shift. Stable old infarct and encephalomalacia in left occipital lobe. No acute cortical infarction. Ventricular size is stable from prior exam. No mass lesion is noted on this unenhanced scan. There is soft tissue swelling left face and left zygomatic region. No zygomatic fracture is noted. No paranasal sinuses air-fluid levels. CT CERVICAL SPINE FINDINGS Axial images of the cervical spine shows no acute fracture or subluxation. Computer processed images shows no acute fracture or subluxation. Mild degenerative changes C1-C2 articulation. Mild disc space flattening with minimal posterior spurring at C3-C4 level. Mild disc space flattening with mild anterior and mild posterior spurring at C4-C5, C5-C6 and C6-C7 level. No prevertebral soft tissue swelling. Cervical airway is patent. There is no pneumothorax in visualized lung apices. IMPRESSION: 1. No acute intracranial abnormality. Stable old infarct and encephalomalacia left occipital lobe. There is left face and left zygomatic region soft tissue swelling. 2. No  cervical spine acute fracture or subluxation. Mild degenerative changes as described above. Electronically Signed   By: Lahoma Crocker M.D.   On: 10/30/2015 17:10   Ct Cervical Spine Wo Contrast  10/30/2015  CLINICAL DATA:  Fall, hit left side of the head, face swelling EXAM: CT HEAD WITHOUT CONTRAST CT CERVICAL SPINE WITHOUT CONTRAST TECHNIQUE: Multidetector CT imaging of the head and cervical spine was performed following the standard protocol without intravenous contrast. Multiplanar CT image reconstructions of the cervical spine were also generated. COMPARISON:  11/09/2014 FINDINGS: CT HEAD FINDINGS No skull fracture is noted. Paranasal sinuses shows mild mucosal thickening with partial opacification bilateral posterior ethmoid air cells. No intracranial hemorrhage, mass effect or midline shift. Stable old infarct and encephalomalacia in left occipital lobe. No acute cortical infarction. Ventricular size is stable from prior exam. No mass lesion is noted on this unenhanced scan. There is soft tissue swelling left face and left zygomatic region. No zygomatic fracture is noted. No paranasal sinuses air-fluid levels. CT CERVICAL SPINE FINDINGS Axial images of the cervical spine shows no acute fracture or subluxation. Computer processed images shows no acute fracture or subluxation. Mild degenerative changes C1-C2 articulation. Mild disc space flattening with minimal posterior spurring at C3-C4 level. Mild disc space flattening  with mild anterior and mild posterior spurring at C4-C5, C5-C6 and C6-C7 level. No prevertebral soft tissue swelling. Cervical airway is patent. There is no pneumothorax in visualized lung apices. IMPRESSION: 1. No acute intracranial abnormality. Stable old infarct and encephalomalacia left occipital lobe. There is left face and left zygomatic region soft tissue swelling. 2. No cervical spine acute fracture or subluxation. Mild degenerative changes as described above. Electronically Signed    By: Lahoma Crocker M.D.   On: 10/30/2015 17:10   Dg Finger Little Right  10/30/2015  CLINICAL DATA:  Postreduction of small finger dislocation. EXAM: RIGHT LITTLE FINGER 2+V COMPARISON:  Earlier the same date. FINDINGS: 1843 hours. The dislocation at the fifth PIP joint has been reduced. The previously questioned small ossific density along the volar aspect of the proximal phalangeal head is not well seen, although the lateral view is limited. There is dorsal soft tissue swelling in the hand with a stable mildly displaced intra-articular fracture involving the base of the fifth metacarpal. There is a small amount of soft tissue emphysema within the ulnar soft tissues. IMPRESSION: 1. The fifth PIP joint dislocation has been reduced. No definite associated fracture fragment identified on these views, although the lateral view is limited. 2. Mildly displaced intra-articular fracture involving the fifth metacarpal base. Electronically Signed   By: Richardean Sale M.D.   On: 10/30/2015 19:06   Dg Finger Little Right  10/30/2015  CLINICAL DATA:  Fall with right fifth finger deformity EXAM: RIGHT LITTLE FINGER 2+V COMPARISON:  None. FINDINGS: There is lateral dislocation of the right fifth finger at the level of the proximal interphalangeal joint. There is a tiny 1-2 mm bone fragment at the volar margin of the distal portion of the proximal phalanx in the right fifth finger, which could represent a tiny avulsion fracture fragment. Partially visualized is a proximal right fifth metacarpal fracture on these views. Mild osteoarthritis in the distal interphalangeal joint of the right fifth finger. IMPRESSION: 1. Lateral right fifth finger dislocation at the level of the PIP joint. 2. Probable tiny avulsion fracture fragment in the volar soft tissues at the level of the distal aspect of the proximal phalanx in the right fifth finger. 3. Partially visualized proximal right fifth metacarpal fracture on these views, recommend  dedicated right hand radiographs for further evaluation. Electronically Signed   By: Ilona Sorrel M.D.   On: 10/30/2015 17:23   Assessment and Plan:   Right shoulder pain  Frozen shoulder, right  She basically needs to take care of her multiple fractures in the upper extremity first primarily.  Procedures conservatively.  Most likely this would be a partial thickness rotator cuff tear versus potentially a stretch capsular injury at endpoint.  Nevertheless, she can proceed conservatively, and I have altered her rehabilitation that she can do at home right now.  Moon protocol.  She asked me to inject her shoulder with steroids, but I did explain that with multiple active fractures this can delay healing and increase rate of delayed union or nonunion, so this was not advisable  Follow-up: 6 weeks  Signed,  Valdis Bevill T. Fermin Yan, MD   Patient's Medications  New Prescriptions   No medications on file  Previous Medications   ACETAMINOPHEN (TYLENOL) 500 MG TABLET    Take 2 tablets by mouth every 6 hours as needed for pain   CALCIUM CARBONATE (OS-CAL) 600 MG TABS    Take 600 mg by mouth daily with breakfast.    CHOLECALCIFEROL (VITAMIN D) 1000  UNITS TABLET    Take 1,000 Units by mouth daily.     DEXTROAMPHETAMINE (DEXTROSTAT) 5 MG TABLET    Take 5 mg by mouth daily.    DILTIAZEM (CARDIZEM CD) 180 MG 24 HR CAPSULE    Take 1 capsule (180 mg total) by mouth daily.   DULOXETINE (CYMBALTA) 30 MG CAPSULE    Take 1 capsule by mouth at bedtime.    DULOXETINE (CYMBALTA) 60 MG CAPSULE    Take 60 mg by mouth every morning.    FLECAINIDE (TAMBOCOR) 100 MG TABLET    Take 1 tablet (100 mg total) by mouth 2 (two) times daily.   HYDROCODONE-ACETAMINOPHEN (NORCO/VICODIN) 5-325 MG TABLET    Take 2 tablets by mouth every 4 (four) hours as needed.   LEVOTHYROXINE (SYNTHROID, LEVOTHROID) 25 MCG TABLET    TAKE 1 TABLET BY MOUTH EVERY DAY   MULTIPLE VITAMIN (MULTIVITAMIN) TABLET    Take 1 tablet by mouth daily.       ORAL ELECTROLYTES (BUFFERED SALT PO)    Take 1 capsule by mouth daily as needed (Electrolyte/Salt supplement).    TIMOLOL MALEATE (TIMOPTIC OP)    Place 1 drop into the left eye daily.    VITAMIN E 400 UNIT CAPSULE    Take 400 Units by mouth daily.     XARELTO 20 MG TABS TABLET    TAKE 1 TABLET BY MOUTH EVERY DAY  Modified Medications   No medications on file  Discontinued Medications   No medications on file

## 2015-12-25 ENCOUNTER — Ambulatory Visit (HOSPITAL_COMMUNITY)
Admission: RE | Admit: 2015-12-25 | Discharge: 2015-12-25 | Disposition: A | Discharge status: Home / Self Care | Payer: Medicare Other | Source: Ambulatory Visit | Attending: Nurse Practitioner | Admitting: Nurse Practitioner

## 2015-12-25 ENCOUNTER — Encounter (HOSPITAL_COMMUNITY): Payer: Self-pay | Admitting: Nurse Practitioner

## 2015-12-25 VITALS — BP 102/60 | HR 71 | Ht 69.0 in | Wt 170.8 lb

## 2015-12-25 DIAGNOSIS — I480 Paroxysmal atrial fibrillation: Secondary | ICD-10-CM

## 2015-12-25 DIAGNOSIS — Z888 Allergy status to other drugs, medicaments and biological substances status: Secondary | ICD-10-CM | POA: Diagnosis not present

## 2015-12-25 DIAGNOSIS — Z79899 Other long term (current) drug therapy: Secondary | ICD-10-CM | POA: Diagnosis not present

## 2015-12-25 DIAGNOSIS — Z832 Family history of diseases of the blood and blood-forming organs and certain disorders involving the immune mechanism: Secondary | ICD-10-CM | POA: Diagnosis not present

## 2015-12-25 DIAGNOSIS — F329 Major depressive disorder, single episode, unspecified: Secondary | ICD-10-CM | POA: Diagnosis not present

## 2015-12-25 DIAGNOSIS — Z7901 Long term (current) use of anticoagulants: Secondary | ICD-10-CM | POA: Diagnosis not present

## 2015-12-25 DIAGNOSIS — R9431 Abnormal electrocardiogram [ECG] [EKG]: Secondary | ICD-10-CM | POA: Diagnosis not present

## 2015-12-25 DIAGNOSIS — F419 Anxiety disorder, unspecified: Secondary | ICD-10-CM | POA: Diagnosis not present

## 2015-12-25 DIAGNOSIS — Z90710 Acquired absence of both cervix and uterus: Secondary | ICD-10-CM | POA: Diagnosis not present

## 2015-12-25 DIAGNOSIS — I4891 Unspecified atrial fibrillation: Secondary | ICD-10-CM | POA: Diagnosis present

## 2015-12-25 DIAGNOSIS — Z8673 Personal history of transient ischemic attack (TIA), and cerebral infarction without residual deficits: Secondary | ICD-10-CM | POA: Diagnosis not present

## 2015-12-25 DIAGNOSIS — Z9889 Other specified postprocedural states: Secondary | ICD-10-CM | POA: Diagnosis not present

## 2015-12-25 DIAGNOSIS — E079 Disorder of thyroid, unspecified: Secondary | ICD-10-CM | POA: Insufficient documentation

## 2015-12-25 NOTE — Addendum Note (Signed)
Encounter addended by: Sherran Needs, NP on: 12/25/2015  4:39 PM<BR>     Documentation filed: Notes Section

## 2015-12-25 NOTE — Progress Notes (Addendum)
Patient ID: Monica Whitaker, female   DOB: 05-01-1946, 70 y.o.   MRN: [id number]        Primary Care Physician: Arnette Norris, MD Referring Physician: Dr. Sherryll Burger is a 70 y.o. female with a h/o PAF on flecainide. When I saw her several months ago, she was having increase in afib epiosodes and a flecainide level was done to see if an increase in flecainide was reasonable. However, her rhythm improved and she has really had low afib burden since then. She did have a  mechanical fall at [facility] 8 weeks ago and broke bones in both hands but did not have to have surgery.  Today, she denies symptoms of palpitations, chest pain, shortness of breath, orthopnea, PND, lower extremity edema, dizziness, presyncope, syncope, or neurologic sequela. The patient is tolerating medications without difficulties and is otherwise without complaint today.   Past Medical History  Diagnosis Date  . Anxiety   . Depression   . CVA (cerebral vascular accident) ([place]) 12/2007  . Ventricular tachycardia ([place])     pt told at [facility] that she had RVOT VT  . Atrial fibrillation ([name])     paroxysmal, failed medical therapy with flecainide,  NSVT with tikosyn  . Thyroid disease   . Stroke ([place])   . Bruises easily    Past Surgical History  Procedure Laterality Date  . Vaginal hysterectomy    . Tonsillectomy    . Eye surgery    . Tee without cardioversion N/A 10/18/2012    Procedure: TRANSESOPHAGEAL ECHOCARDIOGRAM (TEE);  Surgeon: Lelon Perla, MD;  Location: [facility] ENDOSCOPY;  Service: Cardiovascular;  Laterality: N/A;  Pre-Ablation at 12pm  . Atrial fibrillation ablation  10/18/12    PVI by Dr Rayann Heman  . Atrial fibrillation ablation N/A 10/18/2012    Procedure: ATRIAL FIBRILLATION ABLATION;  Surgeon: Thompson Grayer, MD;  Location: [facility] CATH LAB;  Service: Cardiovascular;  Laterality: N/A;  . Open reduction internal fixation (orif) distal radial fracture Right 11/15/2014    Procedure: OPEN REDUCTION INTERNAL  FIXATION (ORIF) RIGHT DISTAL RADIAL FRACTURE WITH ALLOGRAFT BONE GRAFT;  Surgeon: Roseanne Kaufman, MD;  Location: [facility];  Service: Orthopedics;  Laterality: Right;    Current Outpatient Prescriptions  Medication Sig Dispense Refill  . calcium carbonate (OS-CAL) 600 MG TABS Take 600 mg by mouth daily with breakfast.     . cholecalciferol (VITAMIN D) 1000 UNITS tablet Take 1,000 Units by mouth daily.      Marland Kitchen dextroamphetamine (DEXTROSTAT) 5 MG tablet Take 5 mg by mouth daily.     Marland Kitchen diltiazem (CARDIZEM CD) 180 MG 24 hr capsule Take 1 capsule (180 mg total) by mouth daily. 90 capsule 3  . DULoxetine (CYMBALTA) 30 MG capsule Take 1 capsule by mouth at bedtime.     . DULoxetine (CYMBALTA) 60 MG capsule Take 60 mg by mouth every morning.     . flecainide (TAMBOCOR) 100 MG tablet Take 1 tablet (100 mg total) by mouth 2 (two) times daily. 180 tablet 3  . HYDROcodone-acetaminophen (NORCO/VICODIN) 5-325 MG tablet Take 2 tablets by mouth every 4 (four) hours as needed. 20 tablet 0  . levothyroxine (SYNTHROID, LEVOTHROID) 25 MCG tablet TAKE 1 TABLET BY MOUTH EVERY DAY 90 tablet 2  . Multiple Vitamin (MULTIVITAMIN) tablet Take 1 tablet by mouth daily.      . Timolol Maleate (TIMOPTIC OP) Place 1 drop into the left eye daily.     . vitamin E 400 UNIT  capsule Take 400 Units by mouth daily.      Alveda Reasons 20 MG TABS tablet TAKE 1 TABLET BY MOUTH EVERY DAY 90 tablet 1  . acetaminophen (TYLENOL) 500 MG tablet Take 2 tablets by mouth every 6 hours as needed for pain    . Oral Electrolytes (BUFFERED SALT PO) Take 1 capsule by mouth daily as needed (Electrolyte/Salt supplement).      No current facility-administered medications for this encounter.    Allergies  Allergen Reactions  . Darvon Other (See Comments)    Severe panic attacks  . Propoxyphene N-Acetaminophen Other (See Comments)    Severe panic attacks  . Levofloxacin Anxiety and Other (See Comments)    Causes panic attacks.     Social History   Social History  . Marital Status: Married    Spouse Name: N/A  . Number of Children: 0  . Years of Education: N/A   Occupational History  . Retired    Social History Main Topics  . Smoking status: Never Smoker   . Smokeless tobacco: Never Used  . Alcohol Use: 0.5 oz/week    1 drink(s) per week     Comment: regular  . Drug Use: No  . Sexual Activity: Not on file   Other Topics Concern  . Not on file   Social History Narrative   Lives in [place] with her husband.  No children.  Former Psychologist, prison and probation services      Has a living will-    Would desire CPR   Would not want prolonged life support if futile.    Family History  Problem Relation Age of Onset  . Multiple sclerosis Father     ROS- All systems are reviewed and negative except as per the HPI above  Physical Exam: Filed Vitals:   12/25/15 1353  BP: 102/60  Pulse: 71  Height: 5\' 9"  (1.753 m)  Weight: 170 lb 12.8 oz (77.474 kg)    GEN- The patient is well appearing, alert and oriented x 3 today.   Head- normocephalic, atraumatic Eyes-  Sclera clear, conjunctiva pink Ears- hearing intact Oropharynx- clear Neck- supple, no JVP Lymph- no cervical lymphadenopathy Lungs- Clear to ausculation bilaterally, normal work of breathing Heart- Regular rate and rhythm, no murmurs, rubs or gallops, PMI not laterally displaced GI- soft, NT, ND, + BS Extremities- no clubbing, cyanosis, or edema MS- no significant deformity or atrophy Skin- no rash or lesion Psych- euthymic mood, full affect Neuro- strength and sensation are intact  EKG-NSR at 71 bpm, , pr int 140 ms, qrs int 90 ms, qtc 458 ms Epic records reviewed  Assessment and Plan: 1. PAF Currently with low afib burden  Continue flecainide/diltiazem Continue xarelto   F/u with Dr. Rayann Heman as scheduled in November  Kenta Laster C. Matai Carpenito, [facility] [address] [place],  [address] [phone]

## 2015-12-26 ENCOUNTER — Ambulatory Visit (INDEPENDENT_AMBULATORY_CARE_PROVIDER_SITE_OTHER)
Admission: RE | Admit: 2015-12-26 | Discharge: 2015-12-26 | Disposition: A | Discharge status: Home / Self Care | Payer: Medicare Other | Source: Ambulatory Visit | Attending: Family Medicine | Admitting: Family Medicine

## 2015-12-26 ENCOUNTER — Encounter: Payer: Self-pay | Admitting: Family Medicine

## 2015-12-26 ENCOUNTER — Ambulatory Visit (INDEPENDENT_AMBULATORY_CARE_PROVIDER_SITE_OTHER): Payer: Medicare Other | Admitting: Family Medicine

## 2015-12-26 VITALS — BP 132/68 | HR 62 | Temp 97.9°F | Wt 170.2 lb

## 2015-12-26 DIAGNOSIS — T148 Other injury of unspecified body region: Secondary | ICD-10-CM

## 2015-12-26 DIAGNOSIS — M7501 Adhesive capsulitis of right shoulder: Secondary | ICD-10-CM

## 2015-12-26 DIAGNOSIS — T07XXXA Unspecified multiple injuries, initial encounter: Secondary | ICD-10-CM | POA: Insufficient documentation

## 2015-12-26 DIAGNOSIS — M19011 Primary osteoarthritis, right shoulder: Secondary | ICD-10-CM | POA: Diagnosis not present

## 2015-12-26 DIAGNOSIS — M7500 Adhesive capsulitis of unspecified shoulder: Secondary | ICD-10-CM | POA: Insufficient documentation

## 2015-12-26 DIAGNOSIS — M810 Age-related osteoporosis without current pathological fracture: Secondary | ICD-10-CM | POA: Diagnosis not present

## 2015-12-26 NOTE — Patient Instructions (Addendum)
Great to see you.  The maximum dose of Tylenol is 3000 mg a day.  Please call [facility] to schedule a bone density.

## 2015-12-26 NOTE — Progress Notes (Signed)
Subjective:   Patient ID: Monica Whitaker, female    DOB: 1945-12-15, 70 y.o.   MRN: [id number]  Monica Whitaker is a pleasant 70 y.o. year old female who presents to clinic today with pain management  on 12/26/2015  HPI:  Healing from multiple fractures- had a fall on 10/30/15. Note reviewed from 11/06/15.  Then saw my partner, Dr. Lorelei Pont, on 12/16/15 for frozen shoulder. Note reviewed.  He did not feel cortisone injection appropriate at this time as it could delay her other injuries from healing.  She is currently taking Tylenol (has to avoid NSAIDs since she does take Xarelto). Also takes vicodin if pain wakes her in the middle of night.  Still interested in gait training PT once her injuries have healed.  Current Outpatient Prescriptions on File Prior to Visit  Medication Sig Dispense Refill  . acetaminophen (TYLENOL) 500 MG tablet Take 2 tablets by mouth every 6 hours as needed for pain    . calcium carbonate (OS-CAL) 600 MG TABS Take 600 mg by mouth daily with breakfast.     . cholecalciferol (VITAMIN D) 1000 UNITS tablet Take 1,000 Units by mouth daily.      Marland Kitchen dextroamphetamine (DEXTROSTAT) 5 MG tablet Take 5 mg by mouth daily.     Marland Kitchen diltiazem (CARDIZEM CD) 180 MG 24 hr capsule Take 1 capsule (180 mg total) by mouth daily. 90 capsule 3  . DULoxetine (CYMBALTA) 30 MG capsule Take 1 capsule by mouth at bedtime.     . DULoxetine (CYMBALTA) 60 MG capsule Take 60 mg by mouth every morning.     . flecainide (TAMBOCOR) 100 MG tablet Take 1 tablet (100 mg total) by mouth 2 (two) times daily. 180 tablet 3  . HYDROcodone-acetaminophen (NORCO/VICODIN) 5-325 MG tablet Take 2 tablets by mouth every 4 (four) hours as needed. 20 tablet 0  . levothyroxine (SYNTHROID, LEVOTHROID) 25 MCG tablet TAKE 1 TABLET BY MOUTH EVERY DAY 90 tablet 2  . Multiple Vitamin (MULTIVITAMIN) tablet Take 1 tablet by mouth daily.      . Oral Electrolytes (BUFFERED SALT PO) Take 1 capsule by mouth daily as needed  (Electrolyte/Salt supplement).     . Timolol Maleate (TIMOPTIC OP) Place 1 drop into the left eye daily.     . vitamin E 400 UNIT capsule Take 400 Units by mouth daily.      Alveda Reasons 20 MG TABS tablet TAKE 1 TABLET BY MOUTH EVERY DAY 90 tablet 1   No current facility-administered medications on file prior to visit.    Allergies  Allergen Reactions  . Darvon Other (See Comments)    Severe panic attacks  . Propoxyphene N-Acetaminophen Other (See Comments)    Severe panic attacks  . Levofloxacin Anxiety and Other (See Comments)    Causes panic attacks.    Past Medical History  Diagnosis Date  . Anxiety   . Depression   . CVA (cerebral vascular accident) ([place]) 12/2007  . Ventricular tachycardia ([place])     pt told at [facility] that she had RVOT VT  . Atrial fibrillation ([name])     paroxysmal, failed medical therapy with flecainide,  NSVT with tikosyn  . Thyroid disease   . Stroke ([place])   . Bruises easily     Past Surgical History  Procedure Laterality Date  . Vaginal hysterectomy    . Tonsillectomy    . Eye surgery    . Tee without cardioversion N/A 10/18/2012    Procedure: TRANSESOPHAGEAL ECHOCARDIOGRAM (  TEE);  Surgeon: Lelon Perla, MD;  Location: [facility] ENDOSCOPY;  Service: Cardiovascular;  Laterality: N/A;  Pre-Ablation at 12pm  . Atrial fibrillation ablation  10/18/12    PVI by Dr Rayann Heman  . Atrial fibrillation ablation N/A 10/18/2012    Procedure: ATRIAL FIBRILLATION ABLATION;  Surgeon: Thompson Grayer, MD;  Location[facility] CATH LAB;  Service: Cardiovascular;  Laterality: N/A;  . Open reduction internal fixation (orif) distal radial fracture Right 11/15/2014    Procedure: OPEN REDUCTION INTERNAL FIXATION (ORIF) RIGHT DISTAL RADIAL FRACTURE WITH ALLOGRAFT BONE GRAFT;  Surgeon: Roseanne Kaufman, MD;  Location: [place];  Service: Orthopedics;  Laterality: Right;    Family History  Problem Relation Age of Onset  . Multiple sclerosis Father     Social History   Social  History  . Marital Status: Married    Spouse Name: N/A  . Number of Children: 0  . Years of Education: N/A   Occupational History  . Retired    Social History Main Topics  . Smoking status: Never Smoker   . Smokeless tobacco: Never Used  . Alcohol Use: 0.5 oz/week    1 drink(s) per week     Comment: regular  . Drug Use: No  . Sexual Activity: Not on file   Other Topics Concern  . Not on file   Social History Narrative   Lives in [place] with her husband.  No children.  Former Psychologist, prison and probation services      Has a living will-    Would desire CPR   Would not want prolonged life support if futile.   The PMH, PSH, Social History, Family History, Medications, and allergies have been reviewed in [facility], and have been updated if relevant.   Review of Systems  Constitutional: Negative.   Respiratory: Negative.   Musculoskeletal: Positive for myalgias, arthralgias and gait problem.  Hematological: Negative.   Psychiatric/Behavioral: Negative.   All other systems reviewed and are negative.      Objective:    BP 132/68 mmHg  Pulse 62  Temp(Src) 97.9 F (36.6 C) (Oral)  Wt 170 lb 4 oz (77.225 kg)  SpO2 95%   Physical Exam  Constitutional: She is oriented to person, place, and time. She appears well-developed and well-nourished. No distress.  HENT:  Head: Normocephalic.  Eyes: Conjunctivae are normal.  Cardiovascular: Normal rate.  Musculoskeletal:  Cast on right arm/ Brace on left wrist  Neurological: She is alert and oriented to person, place, and time.  Skin: Skin is warm and dry. She is not diaphoretic.  Psychiatric: She has a normal mood and affect. Her behavior is normal. Judgment and thought content normal.  Nursing note and vitals reviewed.      Assessment & Plan:   Frozen shoulder, right - Plan: DG Shoulder Right  Osteoporosis No Follow-up on file.

## 2015-12-26 NOTE — Assessment & Plan Note (Signed)
>  25 minutes spent in face to face time with patient, >50% spent in counselling or coordination of care DEXA ordered- due for repeat bone density. Advised to take up to 3000 mg of Tylenol daily and to only use narcotics in cases of refractory/severe pain. The patient indicates understanding of these issues and agrees with the plan. She did ask for a shoulder xray today which is appropriate if she will require further treatment after her fractures of healed. Xray ordered.

## 2015-12-26 NOTE — Progress Notes (Signed)
Pre visit review using our clinic review tool, if applicable. No additional management support is needed unless otherwise documented below in the visit note. 

## 2016-01-03 ENCOUNTER — Telehealth: Payer: Self-pay

## 2016-01-03 DIAGNOSIS — R2681 Unsteadiness on feet: Secondary | ICD-10-CM

## 2016-01-03 DIAGNOSIS — S62346D Nondisplaced fracture of base of fifth metacarpal bone, right hand, subsequent encounter for fracture with routine healing: Secondary | ICD-10-CM | POA: Diagnosis not present

## 2016-01-03 DIAGNOSIS — S60212D Contusion of left wrist, subsequent encounter: Secondary | ICD-10-CM | POA: Diagnosis not present

## 2016-01-03 DIAGNOSIS — S63286D Dislocation of proximal interphalangeal joint of right little finger, subsequent encounter: Secondary | ICD-10-CM | POA: Diagnosis not present

## 2016-01-03 NOTE — Telephone Encounter (Signed)
Referral placed.

## 2016-01-03 NOTE — Telephone Encounter (Signed)
Pt left v/m; pt seen 12/26/15; pt has gotten braces off her hands and pt request referral for gait training PT at [facility]. Pt request cb.

## 2016-01-06 ENCOUNTER — Encounter: Payer: Self-pay | Admitting: Family Medicine

## 2016-01-10 ENCOUNTER — Telehealth: Payer: Self-pay | Admitting: Family Medicine

## 2016-01-10 ENCOUNTER — Other Ambulatory Visit: Payer: Self-pay | Admitting: Internal Medicine

## 2016-01-10 DIAGNOSIS — E2839 Other primary ovarian failure: Secondary | ICD-10-CM

## 2016-01-10 NOTE — Telephone Encounter (Signed)
ordered

## 2016-01-10 NOTE — Telephone Encounter (Signed)
Pt needs order for bone density to go to [facility]  cb number is [phone] Thank you

## 2016-01-15 ENCOUNTER — Ambulatory Visit: Payer: Medicare Other | Attending: Family Medicine | Admitting: Physical Therapy

## 2016-01-15 ENCOUNTER — Encounter: Payer: Self-pay | Admitting: Physical Therapy

## 2016-01-15 DIAGNOSIS — R2681 Unsteadiness on feet: Secondary | ICD-10-CM | POA: Insufficient documentation

## 2016-01-15 DIAGNOSIS — M6281 Muscle weakness (generalized): Secondary | ICD-10-CM | POA: Diagnosis not present

## 2016-01-15 NOTE — Patient Instructions (Signed)
Sit to Stand / Stand to Sit / Transfers    Sit on edge of a solid chair with arms, feet flat on floor. Lean forward over feet and stand up with hands on chair arms. Sit down slowly with hands on chair arms. Repeat _5__ times per session. Do _2__ sessions per day.  Copyright  VHI. All rights reserved.  EXTENSION: Sitting (Active)    Sit with feet flat. Straighten right knee. Complete __2_ sets of _10-12__ repetitions. Perform _1__ sessions per day.  http://gtsc.exer.us/269   Copyright  VHI. All rights reserved.  FUNCTIONAL MOBILITY: Marching - Standing    March in place by lifting left leg up, then right. Alternate. __5_ reps per set, __2_ sets per day, __5_ days per week Hold onto a support.  Copyright  VHI. All rights reserved.

## 2016-01-15 NOTE — Therapy (Signed)
[place] MAIN [facility] SERVICES [address] [place], [place], [address] Phone: [phone]   Fax:  [phone]  Physical Therapy Evaluation  Patient Details  Name: Monica Whitaker MRN: [id number] Date of Birth: 1946/01/14 Referring Provider: Lucille Passy  Encounter Date: 01/15/2016      PT End of Session - 01/15/16 1144    Visit Number 1   Number of Visits 13   Date for PT Re-Evaluation 02/26/16   Authorization Type G code 1   Authorization Time Period 10   PT Start Time 0830   PT Stop Time 0931   PT Time Calculation (min) 61 min   Equipment Utilized During Treatment Gait belt   Activity Tolerance Patient tolerated treatment well;No increased pain   Behavior During Therapy [facility] for tasks assessed/performed      Past Medical History  Diagnosis Date  . Ventricular tachycardia ([place])     pt told at [facility] that she had RVOT VT  . Atrial fibrillation ([name])     paroxysmal, failed medical therapy with flecainide,  NSVT with tikosyn  . Thyroid disease   . Bruises easily   . Depression     medically controlled   . Anxiety     controlled with meds  . Low blood pressure     no falls, but if gets up to fast  . CVA (cerebral vascular accident) ([place]) 12/2007  . Stroke [facility]) 7 years ago    Lasting effects on balance, different personality.    Past Surgical History  Procedure Laterality Date  . Vaginal hysterectomy    . Tonsillectomy    . Tee without cardioversion N/A 10/18/2012    Procedure: TRANSESOPHAGEAL ECHOCARDIOGRAM (TEE);  Surgeon: Lelon Perla, MD;  Location: [facility] ENDOSCOPY;  Service: Cardiovascular;  Laterality: N/A;  Pre-Ablation at 12pm  . Atrial fibrillation ablation  10/18/12    PVI by Dr Rayann Heman  . Atrial fibrillation ablation N/A 10/18/2012    Procedure: ATRIAL FIBRILLATION ABLATION;  Surgeon: Thompson Grayer, MD;  Location: [facility] CATH LAB;  Service: Cardiovascular;  Laterality: N/A;  . Open reduction internal fixation (orif)  distal radial fracture Right 11/15/2014    Procedure: OPEN REDUCTION INTERNAL FIXATION (ORIF) RIGHT DISTAL RADIAL FRACTURE WITH ALLOGRAFT BONE GRAFT;  Surgeon: Roseanne Kaufman, MD;  Location: [place];  Service: Orthopedics;  Laterality: Right;  . Eye surgery      on L/R eye, macular hole     There were no vitals filed for this visit.       Subjective Assessment - 01/15/16 0850    Subjective 70 y.o. Female s/p chronic stroke that occured 7 years ago and patient resolving; however patient now reports multiple falls and difficulties with walking, balance, fear of falling, and difficulties managing community ambulation.  Patient states she's had multiple smaller falls without injury and two larger falls resulting in injury. The first fall occurring a little over a year ago and the most recent fall occurring 2-3 months ago. The most recent fall resulted in B wrist and hand injuries. The patient states she is no longer limited by these injuries.  Patient states that she exercises 5 x a week through aquatic classes, a LE strengthening class, and walking on her own.   Pertinent History Pertinent Factors Relating to Rehab: depression, anxiety, caregiver to husband, chronic stroke   Limitations Standing;Walking   How long can you sit comfortably? N/A, unsteady when getting up   How long can you stand  comfortably? 45 min., Careful for long periods of time, gets fatigued    How long can you walk comfortably? 1 mi., couple times a weeks   Diagnostic tests DEXA scheduled for 2 weeks, Shoulder: Mild acromioclavicular and glenohumeral degenerative change.   Patient Stated Goals "I don't want to be fearful when I'm moving"   Currently in Pain? No/denies            [facility] PT Assessment - 01/15/16 0001    Assessment   Medical Diagnosis Gait instability   Referring Provider Lucille Passy   Onset Date/Surgical Date 08/16/14   Hand Dominance Right   Next MD Visit July   Prior Therapy  Previous physical 7 years ago; post stroke; good results with increased function   Precautions   Precautions Fall   Balance Screen   Has the patient fallen in the past 6 months Yes   How many times? Multiple, 1 resulting in injury 2-3 months previous   Has the patient had a decrease in activity level because of a fear of falling?  Yes   Is the patient reluctant to leave their home because of a fear of falling?  Yes   Home Environment   Additional [place] community; single level home; no steps to enter, difficulty with threshold step in/out of garage, 4 steps at deck bilat. railings; steps to attic with railing on L.   Prior Function   Level of Independence Independent   Vocation Retired   Leisure Invovled in church, exercises, individual walking,    Cognition   Overall Cognitive Status Within Functional Limits for tasks assessed   Observation/Other Assessments   Observations CN I-XII intact; Brachial, Patella, and Achilles 3+ Bilat.    Dizziness Handicap Inventory (DHI)  62; moderate perception of handicap   Sensation   Light Touch Impaired by gross assessment   Additional Comments Light Touch is intact through except with decrease in sensation in lateral foot (pink toe and just distal to lateral malleolus)   Coordination   Gross Motor Movements are Fluid and Coordinated Yes   Fine Motor Movements are Fluid and Coordinated Yes   Finger Nose Finger Test Increased concentration/time for RUE but no past pointing or dymetria with movement.   Heel Shin Test Normal   Posture/Postural Control   Posture Comments Patient has slight forward head with seated and standing posture, and with slightly slumped posture with rounded in seated.   AROM   Overall AROM Comments General AROM; UE/LE; WFL   Strength   Right Shoulder ABduction 4-/5   Left Shoulder ABduction 4/5   Right Elbow Flexion 4-/5   Right Elbow Extension 4-/5   Left Elbow Flexion 4/5   Left Elbow Extension 4/5   Right  Hip Flexion 4-/5   Left Hip Flexion 3+/5   Right Knee Flexion 3+/5   Right Knee Extension 3+/5   Left Knee Flexion 4-/5   Left Knee Extension 4/5   Right Ankle Dorsiflexion 4-/5   Left Ankle Dorsiflexion 4/5   Transfers   Comments Indepent; Patient transfers without UE use and little difficulties from higher surfaces, with lower surfaces, patient does require UE use; patient is tall, which may contribute; able to move from chair to chair safely; performs with increased time and small steps   Ambulation/Gait   Gait Comments Increased base of support, decreased stride length, appropriate speed and arm swing; walks slower if SPT is not present, reports when ambulating at home that she has to  remind herself every step to strike with heel first.   Standardized Balance Assessment   Five times sit to stand comments  31.9 seconds; >14 increased risk for falls; mutliple attempts with 1st and 5th stance; lower chair with patient being tall could impact difficulty   10 Meter Walk 1.04 m/s, this result shows patient is on the border (cut off 1.0) for increased risk in falls.   High Level Balance   High Level Balance Comments Static standing eyes open: balance was good, standing eyes closed: patient is fair+      Treatment:  HEP initiated including as follows:  Standing marches 2 x 5, every day; instructed to use BUE support and move to 1 HHA once safe.  LAQ 2 x 10-12 reps, every day  Sit to stands with no UE use, 2 x 5 reps.   Due to patient's exercise schedule, patient instructed that she can perform exercises during exercise classes,  Or to rest if she already has performed multiple times during her classes.                      PT Education - 01/15/16 1112    Education provided Yes   Education Details HEP initiated, safety, PT plan of care   Person(s) Educated Patient   Methods Explanation;Demonstration;Verbal cues   Comprehension Verbalized understanding              PT Long Term Goals - 01/15/16 1201    PT LONG TERM GOAL #1   Title Patient will be independent in HEP in order to increase patient's ability to maintain gains achieved in therapy and assist with return to PLOF.    Time 6   Period Weeks   Status New   PT LONG TERM GOAL #2   Title Patient will decrease time take to perform 5 times sit to stand to 20 seconds without UE use in order to decrease her risk for falls.     Time 6   Period Weeks   Status New   PT LONG TERM GOAL #3   Title Patient will improve functional gait assessment by 5 pts. in order to attain MCID for stroke and to decrease fear of falling and improve ease of community ambulation.   Time 6   Period Weeks   Status New   PT LONG TERM GOAL #4   Title Patient will increase overall strength to 4+/5 in both UE and LE in order to complete transfers safely with decreased risk for falls.    Time 6   Period Weeks   Status New   PT LONG TERM GOAL #5   Title Patient will be able to negotiate one 4" curb with no UE support independently in a safe manner in order to allow for greater community negotiation safely.    Time 6   Period Weeks   Status New               Plan - 01/15/16 1147    Clinical Impression Statement Patient is a 70 year old female presenting with increased reports of falls and fear of falling, difficulties in walking and maneuvering in the community, and strength deficits. Patient reports 2 major falls in a little over a year that have resulted in injury specifically to B wrist/hands. Patient had stroke 7 years previous affecting RLE with residual symptoms being an increase in numbness in L lateral foot and balance difficulties. Patient has decreased strength R>L throughout UE/LE.  Patient may be at fall risk for 5 times sit to stand and has moderate handicap on DHI, she is on the border of safe ambulator based on her 10 m/s at 1.0 m/s. Based on the information listed previously, patient would benefit from  continued skilled therapy to address weakness, balance, and decrease fall risks.     Rehab Potential Good   Clinical Impairments Affecting Rehab Potential Positive Factors: exercises regularly, motivated Negative Factors: chronic stroke, anxiety, depression Clinical Impression: Evolving-multiple falls, decrease strength, moderate perception of handicap on DHI   PT Frequency 2x / week   PT Duration 6 weeks   PT Treatment/Interventions ADLs/Self Care Home Management;Aquatic Therapy;Cryotherapy;Electrical Stimulation;Moist Heat;Gait training;Stair training;Functional mobility training;Therapeutic activities;Therapeutic exercise;Balance training;Neuromuscular re-education;Patient/family education;Manual techniques;Energy conservation   PT Next Visit Plan See 2x for 4 weeks, decrease to 1x 2 weeks, if safe to do so. Balance, functional gait assessment, strength    PT Home Exercise Plan HEP initiated   Consulted and Agree with Plan of Care Patient      Patient will benefit from skilled therapeutic intervention in order to improve the following deficits and impairments:  Abnormal gait, Decreased activity tolerance, Decreased balance, Decreased coordination, Decreased endurance, Decreased mobility, Decreased safety awareness, Decreased strength, Impaired sensation, Postural dysfunction, Improper body mechanics, Pain  Visit Diagnosis: Unsteadiness on feet - Plan: PT plan of care cert/re-cert  Muscle weakness (generalized) - Plan: PT plan of care cert/re-cert      G-Codes - [phone]    Functional Assessment Tool Used 5 times sit<>stand, 10 meter walk, DHI, clinical judgement;   Functional Limitation Mobility: Walking and moving around   Mobility: Walking and Moving Around Current Status [phone]) At least 20 percent but less than 40 percent impaired, limited or restricted   Mobility: Walking and Moving Around Goal Status [phone]) At least 1 percent but less than 20 percent impaired, limited or  restricted       Problem List Patient Active Problem List   Diagnosis Date Noted  . Frozen shoulder 12/26/2015  . Osteoporosis 12/26/2015  . Multiple fractures 12/26/2015  . ANXIETY DEPRESSION 04/10/2010  . CEREBRAL EMBOLISM WITHOUT MENTION INFARCT 04/10/2010  . Atrial fibrillation ([place]) 03/11/2010  . FATIGUE / MALAISE 03/11/2010   Tilman Neat, SPT This entire session was performed under direct supervision and direction of a licensed therapist/therapist assistant . I have personally read, edited and approve of the note as written.  Trotter,Margaret PT, DPT 01/15/2016, 1:37 PM  [place] MAIN [facility] SERVICES [address] [place], [place], [address] Phone: [phone]   Fax:  [phone]  Name: Monica Whitaker MRN: [id number] Date of Birth: 01-01-1946

## 2016-01-17 ENCOUNTER — Ambulatory Visit: Payer: Medicare Other

## 2016-01-17 ENCOUNTER — Encounter: Payer: Self-pay | Admitting: Physical Therapy

## 2016-01-17 DIAGNOSIS — R2681 Unsteadiness on feet: Secondary | ICD-10-CM | POA: Diagnosis not present

## 2016-01-17 DIAGNOSIS — M6281 Muscle weakness (generalized): Secondary | ICD-10-CM

## 2016-01-17 NOTE — Therapy (Signed)
[place] MAIN [facility] SERVICES [address] [place], [place], [address] Phone: [phone]   Fax:  [phone]  Physical Therapy Treatment  Patient Details  Name: LANIYAH STOCKSLAGER MRN: [id number] Date of Birth: May 08, 1946 Referring Provider: Lucille Passy  Encounter Date: 01/17/2016      PT End of Session - 01/17/16 0901    Visit Number 2   Number of Visits 13   Date for PT Re-Evaluation 02/26/16   Authorization Type G code 2   Authorization Time Period 10   PT Start Time 0759   PT Stop Time 0845   PT Time Calculation (min) 46 min   Equipment Utilized During Treatment Gait belt   Activity Tolerance Patient tolerated treatment well;No increased pain   Behavior During Therapy [facility] for tasks assessed/performed      Past Medical History  Diagnosis Date  . Ventricular tachycardia ([place])     pt told at [facility] that she had RVOT VT  . Atrial fibrillation ([name])     paroxysmal, failed medical therapy with flecainide,  NSVT with tikosyn  . Thyroid disease   . Bruises easily   . Depression     medically controlled   . Anxiety     controlled with meds  . Low blood pressure     no falls, but if gets up to fast  . CVA (cerebral vascular accident) ([place]) 12/2007  . Stroke [facility]) 7 years ago    Lasting effects on balance, different personality.    Past Surgical History  Procedure Laterality Date  . Vaginal hysterectomy    . Tonsillectomy    . Tee without cardioversion N/A 10/18/2012    Procedure: TRANSESOPHAGEAL ECHOCARDIOGRAM (TEE);  Surgeon: Lelon Perla, MD;  Location: [facility] ENDOSCOPY;  Service: Cardiovascular;  Laterality: N/A;  Pre-Ablation at 12pm  . Atrial fibrillation ablation  10/18/12    PVI by Dr Rayann Heman  . Atrial fibrillation ablation N/A 10/18/2012    Procedure: ATRIAL FIBRILLATION ABLATION;  Surgeon: Thompson Grayer, MD;  Location: [facility] CATH LAB;  Service: Cardiovascular;  Laterality: N/A;  . Open reduction internal fixation (orif) distal  radial fracture Right 11/15/2014    Procedure: OPEN REDUCTION INTERNAL FIXATION (ORIF) RIGHT DISTAL RADIAL FRACTURE WITH ALLOGRAFT BONE GRAFT;  Surgeon: Roseanne Kaufman, MD;  Location: [place];  Service: Orthopedics;  Laterality: Right;  . Eye surgery      on L/R eye, macular hole     There were no vitals filed for this visit.      Subjective Assessment - 01/17/16 0753    Subjective Patient denies any pain; HEP is going well with some overlap with her exercise classes; reports doing well; no falls or near since last visit.   Pertinent History Pertinent Factors Relating to Rehab: depression, anxiety, caregiver to husband, chronic stroke   Limitations Standing;Walking   How long can you sit comfortably? N/A, unsteady when getting up   How long can you stand comfortably? 45 min., Careful for long periods of time, gets fatigued    How long can you walk comfortably? 1 mi., couple times a weeks   Diagnostic tests DEXA scheduled for 2 weeks, Shoulder: Mild acromioclavicular and glenohumeral degenerative change.   Patient Stated Goals "I don't want to be fearful when I'm moving"   Currently in Pain? No/denies            [facility] PT Assessment - 01/17/16 0001    Functional Gait  Assessment   Gait  Level Surface Walks 20 ft in less than 7 sec but greater than 5.5 sec, uses assistive device, slower speed, mild gait deviations, or deviates 6-10 in outside of the 12 in walkway width.   Change in Gait Speed Able to smoothly change walking speed without loss of balance or gait deviation. Deviate no more than 6 in outside of the 12 in walkway width.   Gait with Horizontal Head Turns Performs head turns smoothly with slight change in gait velocity (eg, minor disruption to smooth gait path), deviates 6-10 in outside 12 in walkway width, or uses an assistive device.   Gait with Vertical Head Turns Performs task with slight change in gait velocity (eg, minor disruption to smooth gait path),  deviates 6 - 10 in outside 12 in walkway width or uses assistive device   Gait and Pivot Turn Pivot turns safely in greater than 3 sec and stops with no loss of balance, or pivot turns safely within 3 sec and stops with mild imbalance, requires small steps to catch balance.   Step Over Obstacle Is able to step over one shoe box (4.5 in total height) but must slow down and adjust steps to clear box safely. May require verbal cueing.   Gait with Narrow Base of Support Ambulates less than 4 steps heel to toe or cannot perform without assistance.   Gait with Eyes Closed Walks 20 ft, slow speed, abnormal gait pattern, evidence for imbalance, deviates 10-15 in outside 12 in walkway width. Requires more than 9 sec to ambulate 20 ft.   Ambulating Backwards Walks 20 ft, uses assistive device, slower speed, mild gait deviations, deviates 6-10 in outside 12 in walkway width.   Steps Alternating feet, must use rail.   Total Score 17   FGA comment: Geriatric cut off > 22/30, puts patient at risk for falls       Treatment:  Neuromuscular Re-ed:  Functional Gait Assessment: Difficulty with movements requiring limited to no visual support and narrowing base of support.   Heel/Toe Raises in // bars 2 x 15, limited ability to perform heel raises without increased hip flexion.  Performed with 1 HHA and no HHA, increased difficulty and greater hip movement as compensation when using no UE support.  Static balance on airex pad: 2 x 30 seconds; predominantly addressing ankle strategy, very difficult for patient with no HHA, no loss of balance.   Marches on airex pad: 1 HHA, 2 x 10 reps each LE;  Single leg stance, multiple attempts on each LE,  Unable to perform >3-5 seconds without UE support, pelvic rise/drop indicates weak gluteus medius.   Ther Ex:  Leg press 2 x 10 at 90#, increased due to reported ease at lighter weight; 105#, 1 x 10 reps; Standing hip ABD with red resistance band; 1 x 10 each LE;  patient independently dons/doffs theraband; given red theraband for home Instructed patient to maintain proper form in order to target gluteus medius muscle instead of TFL muscle.  Patient required CGA with balance exercises due to fear of falling and mild off balance appearance throughout; min-mod VCs to instruct patient on appropriate technique for exercises That address stability and strength.                        PT Education - 01/17/16 0856    Education provided Yes   Education Details HEP progressed, exercise intensity, safety   Person(s) Educated Patient   Methods Explanation;Demonstration  Comprehension Verbalized understanding;Returned demonstration             PT Long Term Goals - 01/15/16 1201    PT LONG TERM GOAL #1   Title Patient will be independent in HEP in order to increase patient's ability to maintain gains achieved in therapy and assist with return to PLOF.    Time 6   Period Weeks   Status New   PT LONG TERM GOAL #2   Title Patient will decrease time take to perform 5 times sit to stand to 20 seconds without UE use in order to decrease her risk for falls.     Time 6   Period Weeks   Status New   PT LONG TERM GOAL #3   Title Patient will improve functional gait assessment by 5 pts. in order to attain MCID for stroke and to decrease fear of falling and improve ease of community ambulation.   Time 6   Period Weeks   Status New   PT LONG TERM GOAL #4   Title Patient will increase overall strength to 4+/5 in both UE and LE in order to complete transfers safely with decreased risk for falls.    Time 6   Period Weeks   Status New   PT LONG TERM GOAL #5   Title Patient will be able to negotiate one 4" curb with no UE support independently in a safe manner in order to allow for greater community negotiation safely.    Time 6   Period Weeks   Status New               Plan - 01/17/16 0902    Clinical Impression Statement Patient  still exhibits fear of falling with exercises espeically during functional gait assessment. Patient is at risk for falls with 17/30 on functional gait assessment. Patient has difficulty ambulating without shifts from midline if not able to use vision and feels uncomfortable if not 100% focused on ambulation. During SLS patient is unable to hold >3-5 seconds without using HHA, with pelvic rise and drop indicating weak glut medius muscle. She reports fear with balance activities and has difficulty performing.  She performs strengthening activities with min VCs to perform with proper technique especially during hip abduction with red resistance band.  Patient would continue to benefit from skilled PT to increase balance, strength, gait steadiness, and decrease risk for falls and fear of fall to perform ADLs safely.     Rehab Potential Good   Clinical Impairments Affecting Rehab Potential Positive Factors: exercises regularly, motivated Negative Factors: chronic stroke, anxiety, depression Clinical Impression: Evolving-multiple falls, decrease strength, moderate perception of handicap on DHI   PT Frequency 2x / week   PT Duration 6 weeks   PT Treatment/Interventions ADLs/Self Care Home Management;Aquatic Therapy;Cryotherapy;Electrical Stimulation;Moist Heat;Gait training;Stair training;Functional mobility training;Therapeutic activities;Therapeutic exercise;Balance training;Neuromuscular re-education;Patient/family education;Manual techniques;Energy conservation   PT Next Visit Plan See 2x for 4 weeks, decrease to 1x 2 weeks, if safe to do so. Balance, strength    PT Home Exercise Plan HEP progression   Consulted and Agree with Plan of Care Patient      Patient will benefit from skilled therapeutic intervention in order to improve the following deficits and impairments:  Abnormal gait, Decreased activity tolerance, Decreased balance, Decreased coordination, Decreased endurance, Decreased mobility, Decreased  safety awareness, Decreased strength, Impaired sensation, Postural dysfunction, Improper body mechanics, Pain  Visit Diagnosis: Unsteadiness on feet  Muscle weakness (generalized)     Problem List  Patient Active Problem List   Diagnosis Date Noted  . Frozen shoulder 12/26/2015  . Osteoporosis 12/26/2015  . Multiple fractures 12/26/2015  . ANXIETY DEPRESSION 04/10/2010  . CEREBRAL EMBOLISM WITHOUT MENTION INFARCT 04/10/2010  . Atrial fibrillation ([place]) 03/11/2010  . FATIGUE / MALAISE 03/11/2010    This entire session was performed under direct supervision and direction of a licensed therapist/therapist assistant . I have personally read, edited and approve of the note as written.   Tilman Neat, SPT Huprich,Jason DPT 01/17/2016, 11:28 AM  [place] MAIN [facility] SERVICES [address] [place], [place], [address] Phone: [phone]   Fax:  [phone]  Name: NELLY CRAUN MRN: [id number] Date of Birth: [birth year]

## 2016-01-17 NOTE — Patient Instructions (Addendum)
ABDUCTION: Standing - Resistance Band (Active)    Stand, feet flat. Against yellow resistance band, lift right leg out to side. Complete _1__ sets of _10__ repetitions. Perform _1-2__ sessions per day.  http://gtsc.exer.us/117   Copyright  VHI. All rights reserved.  Balance: One-Leg    Stand on left leg on Foot Triangle of Support. Use hand supports. Let go with left hand, then right hand. Support as little as possible but as much as needed. Stand _10__ seconds. Repeat, standing on right leg.  Copyright  VHI. All rights reserved.

## 2016-01-21 ENCOUNTER — Encounter: Payer: Medicare Other | Admitting: Physical Therapy

## 2016-01-21 DIAGNOSIS — H40003 Preglaucoma, unspecified, bilateral: Secondary | ICD-10-CM | POA: Diagnosis not present

## 2016-01-22 ENCOUNTER — Encounter: Payer: Medicare Other | Admitting: Physical Therapy

## 2016-01-24 ENCOUNTER — Encounter: Payer: Self-pay | Admitting: Physical Therapy

## 2016-01-24 ENCOUNTER — Ambulatory Visit: Payer: Medicare Other | Admitting: Physical Therapy

## 2016-01-24 DIAGNOSIS — M6281 Muscle weakness (generalized): Secondary | ICD-10-CM | POA: Diagnosis not present

## 2016-01-24 DIAGNOSIS — R2681 Unsteadiness on feet: Secondary | ICD-10-CM | POA: Diagnosis not present

## 2016-01-24 NOTE — Patient Instructions (Addendum)
Leg Press: Double Leg (Machine)    Press forward until knee is just short of locked position. Do __2__ sets. Complete __15__ repetitions.  http://st.exer.us/339   Copyright  VHI. All rights reserved.  ABDUCTION: Standing - Resistance Band (Active)    Stand, feet flat. Against yellow resistance band, lift right leg out to side. Complete _2__ sets of _10__ repetitions. Perform _1 sessions per day.  http://gtsc.exer.us/117   Copyright  VHI. All rights reserved.

## 2016-01-24 NOTE — Therapy (Signed)
[place] MAIN [facility] SERVICES [address] [place], [place], [address] Phone: [phone]   Fax:  [phone]  Physical Therapy Treatment  Patient Details  Name: Monica Whitaker MRN: [id number] Date of Birth: Oct 10, 1945 Referring Provider: Lucille Passy  Encounter Date: 01/24/2016      PT End of Session - 01/24/16 0945    Visit Number 3   Number of Visits 13   Date for PT Re-Evaluation 02/26/16   Authorization Type G code 3   Authorization Time Period 10   PT Start Time 0802   PT Stop Time [id number]   PT Time Calculation (min) 45 min   Equipment Utilized During Treatment Gait belt   Activity Tolerance Patient tolerated treatment well;No increased pain   Behavior During Therapy [facility] for tasks assessed/performed      Past Medical History  Diagnosis Date  . 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 Atrial fibrillation ([name])     paroxysmal, failed medical therapy with flecainide,  NSVT with tikosyn  . Thyroid disease   . Bruises easily   . Depression     medically controlled   . Anxiety     controlled with meds  . Low blood pressure     no falls, but if gets up to fast  . CVA (cerebral vascular accident) ([place]) 12/2007  . Stroke [facility] 7 years ago    Lasting effects on balance, different personality.    Past Surgical History  Procedure Laterality Date  . Vaginal hysterectomy    . Tonsillectomy    . Tee without cardioversion N/A 10/18/2012    Procedure: TRANSESOPHAGEAL ECHOCARDIOGRAM (TEE);  Surgeon: Lelon Perla, MD;  Location: [facility] ENDOSCOPY;  Service: Cardiovascular;  Laterality: N/A;  Pre-Ablation at 12pm  . Atrial fibrillation ablation  10/18/12    PVI by Dr Rayann Heman  . Atrial fibrillation ablation N/A 10/18/2012    Procedure: ATRIAL FIBRILLATION ABLATION;  Surgeon: Thompson Grayer, MD;  Location: [facility] CATH LAB;  Service: Cardiovascular;  Laterality: N/A;  . Open reduction internal fixation (orif) distal  radial fracture Right 11/15/2014    Procedure: OPEN REDUCTION INTERNAL FIXATION (ORIF) RIGHT DISTAL RADIAL FRACTURE WITH ALLOGRAFT BONE GRAFT;  Surgeon: Roseanne Kaufman, MD;  Location: [place];  Service: Orthopedics;  Laterality: Right;  . Eye surgery      on L/R eye, macular hole     There were no vitals filed for this visit.      Subjective Assessment - 01/24/16 0807    Subjective Patient denies any pain or current falls; reports doing well and continuing with HEP and exercise classes   Pertinent History Pertinent Factors Relating to Rehab: depression, anxiety, caregiver to husband, chronic stroke   Limitations Standing;Walking   How long can you sit comfortably? N/A, unsteady when getting up   How long can you stand comfortably? 45 min., Careful for long periods of time, gets fatigued    How long can you walk comfortably? 1 mi., couple times a weeks   Diagnostic tests DEXA scheduled for 2 weeks, Shoulder: Mild acromioclavicular and glenohumeral degenerative change.   Patient Stated Goals "I don't want to be fearful when I'm moving"   Currently in Pain? No/denies       Treatment:  NuStep Warm up; L3 x 3 min., LE only (unbilled)  TherEx:  Resisted ankle strengthening with red theraband; LLE only; patient seated with ankles only off  table; 2 x 10 into DF and EV.   Hip abduction with red theraband resistance, 1 UE support on // bars, 2 x 10; instructed to perform with toe facing forward, knee extended, and no trunk flexion to increase challenge to gluteus medius and avoid TFL substitution.  Seated leg press; BLE; 2 x 15, 105#, patient instructed to perform with increase speed concentrically and control with 3 second count eccentrically to address power.  Educated patient that if she performed at home and used greater weight not to focus on the fast concentric movement due to strengthening focus (force) not power focus.   Neuromuscular Training (performed in //  bars except for walking):  Airex foam pad balance beam walking, forward x 4, sideways x 2; attempted with no UE support, used UE when became difficult Cued to perform heel to toe in order to increased challenge to balance.    Airex foam square, no HHA unless patient was starting to lose balance; static standing feet apart eyes open x 30 seconds; static standing feet apart eyes closed x 30 seconds with increased difficulty/swaying;  Static standing Romberg position with BLE, x 15 seconds each; increased difficulty maintaining balance with R LE behind due to increased weakness from previous stroke.   Walking in hallway with cognitive tasks; tasks included cervical rotation identifying cards placed on the wall.   Patient performed x 2 listing all the cards she saw; gait speed was decreased compared to usual gait speed.  With patient's focus on gait, RLE had slightly decreased heel to toe pattern with slightly decreased knee flexion.   Instructed to walk at comfortable normal speed without cognitive task, after completing, instructed to maintain that quickness in order to challenge herself.   Instructed to perform x 2 with listing number on card during ambulation with  Cervical rotation B directions.  Instructed to perform x 2 with listing suit and number on card during ambulation with cervical rotation B directions.   Gait pattern without focus on gait, patient had multiple shifts from midline with improvement as practicing the tasks continued.   Difficulty maintaining speed throughout. Improved heel to toe pattern of RLE with slightly increased knee flexion.  Throughout session, patient required min-mod VCs to perform techniques correctly and maintain speed. Also required min tactile cueing to maintain balance with shifts from midline during ambulation and balance activities.                         PT Education - 01/24/16 [id number]    Education provided Yes   Education Details  HEP  progressed, safety, monitoring physical activity   Person(s) Educated Patient   Methods Explanation;Tactile cues;Demonstration   Comprehension Verbalized understanding;Returned demonstration             PT Long Term Goals - 01/15/16 1201    PT LONG TERM GOAL #1   Title Patient will be independent in HEP in order to increase patient's ability to maintain gains achieved in therapy and assist with return to PLOF.    Time 6   Period Weeks   Status New   PT LONG TERM GOAL #2   Title Patient will decrease time take to perform 5 times sit to stand to 20 seconds without UE use in order to decrease her risk for falls.     Time 6   Period Weeks   Status New   PT LONG TERM GOAL #3   Title Patient will improve functional  gait assessment by 5 pts. in order to attain MCID for stroke and to decrease fear of falling and improve ease of community ambulation.   Time 6   Period Weeks   Status New   PT LONG TERM GOAL #4   Title Patient will increase overall strength to 4+/5 in both UE and LE in order to complete transfers safely with decreased risk for falls.    Time 6   Period Weeks   Status New   PT LONG TERM GOAL #5   Title Patient will be able to negotiate one 4" curb with no UE support independently in a safe manner in order to allow for greater community negotiation safely.    Time 6   Period Weeks   Status New               Plan - 01/24/16 0946    Clinical Impression Statement Patient has difficulty performing dynamic balance and exercise activites including walking on airex foam pad, walking with cognitive task, and hip abduction due to fear of falling as well as decreased balance and strength. Patient has multiple shifts from midline during ambulation and balance training with no losses of balance.  Patient would continue to benefit from skilled PT intervention to address strength, balance, fear of falling, and safety with ambulation.   Rehab Potential Good   Clinical  Impairments Affecting Rehab Potential Positive Factors: exercises regularly, motivated Negative Factors: chronic stroke, anxiety, depression Clinical Impression: Evolving-multiple falls, decrease strength, moderate perception of handicap on DHI   PT Frequency 2x / week   PT Duration 6 weeks   PT Treatment/Interventions ADLs/Self Care Home Management;Aquatic Therapy;Cryotherapy;Electrical Stimulation;Moist Heat;Gait training;Stair training;Functional mobility training;Therapeutic activities;Therapeutic exercise;Balance training;Neuromuscular re-education;Patient/family education;Manual techniques;Energy conservation   PT Next Visit Plan See 2x for 4 weeks, decrease to 1x 2 weeks, if safe to do so. Balance, strength, dual task ambulation   PT Home Exercise Plan HEP progression   Consulted and Agree with Plan of Care Patient      Patient will benefit from skilled therapeutic intervention in order to improve the following deficits and impairments:  Abnormal gait, Decreased activity tolerance, Decreased balance, Decreased coordination, Decreased endurance, Decreased mobility, Decreased safety awareness, Decreased strength, Impaired sensation, Postural dysfunction, Improper body mechanics, Pain  Visit Diagnosis: Unsteadiness on feet  Muscle weakness (generalized)     Problem List Patient Active Problem List   Diagnosis Date Noted  . Frozen shoulder 12/26/2015  . Osteoporosis 12/26/2015  . Multiple fractures 12/26/2015  . ANXIETY DEPRESSION 04/10/2010  . CEREBRAL EMBOLISM WITHOUT MENTION INFARCT 04/10/2010  . Atrial fibrillation ([place]) 03/11/2010  . FATIGUE / MALAISE 03/11/2010   Tilman Neat, SPT This entire session was performed under direct supervision and direction of a licensed therapist/therapist assistant . I have personally read, edited and approve of the note as written.  Trotter,Margaret PT, DPT 01/24/2016, 11:09 AM  [place] MAIN [facility]  SERVICES [address] [place], [place], [address] Phone: [phone]   Fax:  [phone]  Name: MELVIE HANDAL MRN: [id number] Date of Birth: 1945-11-07

## 2016-01-26 ENCOUNTER — Other Ambulatory Visit: Payer: Self-pay | Admitting: Internal Medicine

## 2016-01-27 ENCOUNTER — Other Ambulatory Visit: Payer: Self-pay | Admitting: Internal Medicine

## 2016-01-28 ENCOUNTER — Encounter: Payer: Self-pay | Admitting: Physical Therapy

## 2016-01-28 ENCOUNTER — Ambulatory Visit: Payer: Medicare Other | Admitting: Physical Therapy

## 2016-01-28 DIAGNOSIS — M6281 Muscle weakness (generalized): Secondary | ICD-10-CM | POA: Diagnosis not present

## 2016-01-28 DIAGNOSIS — R2681 Unsteadiness on feet: Secondary | ICD-10-CM

## 2016-01-28 NOTE — Patient Instructions (Addendum)
   Ankle Dorsiflexion: Long-Sitting (Single Leg)    Sit facing anchor, tubing around forefoot, pull toes back toward head. Repeat _15_ times per set. Repeat with other leg. Do _2_ sets per session. Do _5_ sessions per week. Anchor Height: Ankle (when standing)  http://tub.exer.us/222   Copyright  VHI. All rights reserved.  Ankle Eversion    Place band around foot then cross band and hold in opposite-side hand. Lift leg a few inches and turn foot outward. Repeat _15__ times each leg. Do __2_ sessions per day.  Copyright  VHI. All rights reserved.

## 2016-01-28 NOTE — Therapy (Signed)
[place] MAIN [facility] SERVICES [address] [place], [place], [address] Phone: [phone]   Fax:  [phone]  Physical Therapy Treatment  Patient Details  Name: Monica Whitaker MRN: [id number] Date of Birth: [birth year] Referring Provider: Lucille Passy  Encounter Date: 01/28/2016      PT End of Session - 01/28/16 1354    Visit Number 4   Number of Visits 13   Date for PT Re-Evaluation 02/26/16   Authorization Type G code 4   Authorization Time Period 10   PT Start Time 1020   PT Stop Time 1104   PT Time Calculation (min) 44 min   Equipment Utilized During Treatment Gait belt   Activity Tolerance Patient tolerated treatment well;No increased pain   Behavior During Therapy [facility] for tasks assessed/performed      Past Medical History  Diagnosis Date  . 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 Atrial fibrillation ([name])     paroxysmal, failed medical therapy with flecainide,  NSVT with tikosyn  . Thyroid disease   . Bruises easily   . Depression     medically controlled   . Anxiety     controlled with meds  . Low blood pressure     no falls, but if gets up to fast  . CVA (cerebral vascular accident) ([place]) 12/2007  . Stroke [facility] 7 years ago    Lasting effects on balance, different personality.    Past Surgical History  Procedure Laterality Date  . Vaginal hysterectomy    . Tonsillectomy    . Tee without cardioversion N/A 10/18/2012    Procedure: TRANSESOPHAGEAL ECHOCARDIOGRAM (TEE);  Surgeon: Lelon Perla, MD;  Location: [facility] ENDOSCOPY;  Service: Cardiovascular;  Laterality: N/A;  Pre-Ablation at 12pm  . Atrial fibrillation ablation  10/18/12    PVI by Dr Rayann Heman  . Atrial fibrillation ablation N/A 10/18/2012    Procedure: ATRIAL FIBRILLATION ABLATION;  Surgeon: Thompson Grayer, MD;  Location: [facility] CATH LAB;  Service: Cardiovascular;  Laterality: N/A;  . Open reduction internal fixation (orif) distal  radial fracture Right 11/15/2014    Procedure: OPEN REDUCTION INTERNAL FIXATION (ORIF) RIGHT DISTAL RADIAL FRACTURE WITH ALLOGRAFT BONE GRAFT;  Surgeon: Roseanne Kaufman, MD;  Location: [place];  Service: Orthopedics;  Laterality: Right;  . Eye surgery      on L/R eye, macular hole     There were no vitals filed for this visit.      Subjective Assessment - 01/28/16 1019    Subjective Patient denies pain; states that she has already been to an exercise class today which involved LE strengthening; HEP going well.    Pertinent History Pertinent Factors Relating to Rehab: depression, anxiety, caregiver to husband, chronic stroke   Limitations Standing;Walking   How long can you sit comfortably? N/A, unsteady when getting up   How long can you stand comfortably? 45 min., Careful for long periods of time, gets fatigued    How long can you walk comfortably? 1 mi., couple times a weeks   Diagnostic tests DEXA scheduled for 2 weeks, Shoulder: Mild acromioclavicular and glenohumeral degenerative change.   Patient Stated Goals "I don't want to be fearful when I'm moving"      Treatment:  Ankle DF and EV, red tband resistance, 2 x 10, instructed to add to HEP (given handout); required instruction to not perform hip ER but only foot EV.  Step ups; forward/lateral direction; RLE only; 2 x 10; encouraged to not use HHA, required min assist for balance and to decrease fear of falling.    Instructed to walk over bolsters on ground; in // bars; fear increased due to MOI of previous fall; instructed to get LE close to bolster before stepping over and to attempt to perform in stride.  Increased difficulty with RLE stepping over bolster.  Ball toss x 20; Standing on airex pad with 1x of feet shoulder width apart, 1 x feet together,  Advanced to standing on firm floor in single leg stance, BLE; x 10 balls each.    Dual motor task ambulation; x 4 walks in the hall; instructed to perform  finger taps BUE and walk at the same time, improved with practice; originally multiple shifts from midline.  Quick steps forward direction, x 10 each LE, min assist to regain balance x 1  Patient required multiple VCs to perform exercise appropriately and decrease HHA; encouraged to perform when she was comfortable and that she could do it.                            PT Education - 01/28/16 1354    Education provided Yes   Education Details HEP progressed, safety, dual task   Person(s) Educated Patient   Methods Explanation;Demonstration;Verbal cues   Comprehension Verbal cues required;Verbalized understanding             PT Long Term Goals - 01/15/16 1201    PT LONG TERM GOAL #1   Title Patient will be independent in HEP in order to increase patient's ability to maintain gains achieved in therapy and assist with return to PLOF.    Time 6   Period Weeks   Status New   PT LONG TERM GOAL #2   Title Patient will decrease time take to perform 5 times sit to stand to 20 seconds without UE use in order to decrease her risk for falls.     Time 6   Period Weeks   Status New   PT LONG TERM GOAL #3   Title Patient will improve functional gait assessment by 5 pts. in order to attain MCID for stroke and to decrease fear of falling and improve ease of community ambulation.   Time 6   Period Weeks   Status New   PT LONG TERM GOAL #4   Title Patient will increase overall strength to 4+/5 in both UE and LE in order to complete transfers safely with decreased risk for falls.    Time 6   Period Weeks   Status New   PT LONG TERM GOAL #5   Title Patient will be able to negotiate one 4" curb with no UE support independently in a safe manner in order to allow for greater community negotiation safely.    Time 6   Period Weeks   Status New               Plan - 01/28/16 1356    Clinical Impression Statement Patient exhibits difficulty performing dynamic balance  mostly due to fear and confounding muscle weakness with decreased ankle strategy. Patient has no change in pain throughout treatment and is able to understand activities well. The patient does note mild fatigue in her RLE after balance and strengthening exercises, which may also have been increased due to attending an exercise class focusing on LE  strength. Patient required  min VCs for appropriate technique and min tactile assistance to regain balance 1 time during quick stepping activity. Patient would continue to benefit from skilled therapy in order to increase LE strength, gain confidence, and maintain safety during ambulation.   Rehab Potential Good   Clinical Impairments Affecting Rehab Potential Positive Factors: exercises regularly, motivated Negative Factors: chronic stroke, anxiety, depression Clinical Impression: Evolving-multiple falls, decrease strength, moderate perception of handicap on DHI   PT Frequency 2x / week   PT Duration 6 weeks   PT Treatment/Interventions ADLs/Self Care Home Management;Aquatic Therapy;Cryotherapy;Electrical Stimulation;Moist Heat;Gait training;Stair training;Functional mobility training;Therapeutic activities;Therapeutic exercise;Balance training;Neuromuscular re-education;Patient/family education;Manual techniques;Energy conservation   PT Next Visit Plan See 2x for 4 weeks, decrease to 1x 2 weeks, if safe to do so. Balance, strength, dual task ambulation   PT Home Exercise Plan HEP progression   Consulted and Agree with Plan of Care Patient      Patient will benefit from skilled therapeutic intervention in order to improve the following deficits and impairments:  Abnormal gait, Decreased activity tolerance, Decreased balance, Decreased coordination, Decreased endurance, Decreased mobility, Decreased safety awareness, Decreased strength, Impaired sensation, Postural dysfunction, Improper body mechanics, Pain  Visit Diagnosis: Unsteadiness on feet  Muscle  weakness (generalized)     Problem List Patient Active Problem List   Diagnosis Date Noted  . Frozen shoulder 12/26/2015  . Osteoporosis 12/26/2015  . Multiple fractures 12/26/2015  . ANXIETY DEPRESSION 04/10/2010  . CEREBRAL EMBOLISM WITHOUT MENTION INFARCT 04/10/2010  . Atrial fibrillation ([place]) 03/11/2010  . FATIGUE / MALAISE 03/11/2010   Tilman Neat, SPT This entire session was performed under direct supervision and direction of a licensed therapist/therapist assistant . I have personally read, edited and approve of the note as written.  Trotter,Margaret PT, DPT 01/28/2016, 3:34 PM  [place] MAIN [facility] SERVICES [address] [place], [place], [address] Phone: [phone]   Fax:  [phone]  Name: Monica Whitaker MRN: [id number] Date of Birth: September 04, 1945

## 2016-01-31 ENCOUNTER — Encounter: Payer: Medicare Other | Admitting: Physical Therapy

## 2016-01-31 ENCOUNTER — Ambulatory Visit: Payer: Medicare Other | Admitting: Physical Therapy

## 2016-01-31 ENCOUNTER — Encounter: Payer: Self-pay | Admitting: Physical Therapy

## 2016-01-31 DIAGNOSIS — M6281 Muscle weakness (generalized): Secondary | ICD-10-CM

## 2016-01-31 DIAGNOSIS — R2681 Unsteadiness on feet: Secondary | ICD-10-CM | POA: Diagnosis not present

## 2016-01-31 NOTE — Therapy (Signed)
[place] MAIN [facility] SERVICES [address] [place], [place], [address] Phone: [phone]   Fax:  [phone]  Physical Therapy Treatment  Patient Details  Name: Monica Whitaker MRN: [id number] Date of Birth: 11/01/1945 Referring Provider: Lucille Passy  Encounter Date: 01/31/2016      PT End of Session - 01/31/16 0851    Visit Number 5   Number of Visits 13   Date for PT Re-Evaluation 02/26/16   Authorization Type G code 5   Authorization Time Period 10   PT Start Time 0800   PT Stop Time 0845   PT Time Calculation (min) 45 min   Equipment Utilized During Treatment Gait belt   Activity Tolerance Patient tolerated treatment well   Behavior During Therapy [facility] for tasks assessed/performed      Past Medical History  Diagnosis Date  . 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 Atrial fibrillation ([name])     paroxysmal, failed medical therapy with flecainide,  NSVT with tikosyn  . Thyroid disease   . Bruises easily   . Depression     medically controlled   . Anxiety     controlled with meds  . Low blood pressure     no falls, but if gets up to fast  . CVA (cerebral vascular accident) ([place]) 12/2007  . Stroke [facility]) 7 years ago    Lasting effects on balance, different personality.    Past Surgical History  Procedure Laterality Date  . Vaginal hysterectomy    . Tonsillectomy    . Tee without cardioversion N/A 10/18/2012    Procedure: TRANSESOPHAGEAL ECHOCARDIOGRAM (TEE);  Surgeon: Lelon Perla, MD;  Location: [facility] ENDOSCOPY;  Service: Cardiovascular;  Laterality: N/A;  Pre-Ablation at 12pm  . Atrial fibrillation ablation  10/18/12    PVI by Dr Rayann Heman  . Atrial fibrillation ablation N/A 10/18/2012    Procedure: ATRIAL FIBRILLATION ABLATION;  Surgeon: Thompson Grayer, MD;  Location: [facility] CATH LAB;  Service: Cardiovascular;  Laterality: N/A;  . Open reduction internal fixation (orif) distal radial fracture  Right 11/15/2014    Procedure: OPEN REDUCTION INTERNAL FIXATION (ORIF) RIGHT DISTAL RADIAL FRACTURE WITH ALLOGRAFT BONE GRAFT;  Surgeon: Roseanne Kaufman, MD;  Location: [place];  Service: Orthopedics;  Laterality: Right;  . Eye surgery      on L/R eye, macular hole     There were no vitals filed for this visit.      Subjective Assessment - 01/31/16 0753    Subjective Pt denies any pain or discomfort during visit, she reports going to water aerobics class after todays session.     Pertinent History Pertinent Factors Relating to Rehab: depression, anxiety, caregiver to husband, chronic stroke   Limitations Standing;Walking   How long can you sit comfortably? N/A, unsteady when getting up   How long can you stand comfortably? 45 min., Careful for long periods of time, gets fatigued    How long can you walk comfortably? 1 mi., couple times a weeks   Diagnostic tests DEXA scheduled for 2 weeks, Shoulder: Mild acromioclavicular and glenohumeral degenerative change.   Patient Stated Goals "I don't want to be fearful when I'm moving"      Treatment Resisted walking with 7lb resistance forwards/backwards x 2 laps, sidestepping to the right x 2 laps, sidestepping to the left x 2 laps, min VCs for eccentric control and CGA for stability and pt  comfort Wall squats against therapy ball, 2 sets x 10 reps, min VCs to maintain hip abduction and proper squat form  Sidestepping in diagonal pattern with red theraband resistance forwards and backwards x 2 laps, min VCs to increase step length and upright posture  Eccentric calf raises, 1 set x 10 reps leading with BLEs, 2HHA, min VCs to increase dorsiflexion ROM  Anterior/Lateral/Posterior cone tapping with BLEs while standing on blue airex pad, 1 set x 10 reps, 1 HHA and min A for stability, min VCs for forward gaze throughout  Tandem stance with front leg of yellow dyna disc and back leg standing on blue airex pad while performing  alternating shoulder flexion with intermittent bouts of bilateral shoulder flexion, 1 set x 10 reps, min A for stability and min VCs to stay within BOS                             PT Education - 01/31/16 0850    Education provided Yes   Education Details reinstructed in HEP, purpose of backwards walking    Person(s) Educated Patient   Methods Explanation;Demonstration;Verbal cues   Comprehension Verbalized understanding;Verbal cues required;Returned demonstration             PT Long Term Goals - 01/15/16 1201    PT LONG TERM GOAL #1   Title Patient will be independent in HEP in order to increase patient's ability to maintain gains achieved in therapy and assist with return to PLOF.    Time 6   Period Weeks   Status New   PT LONG TERM GOAL #2   Title Patient will decrease time take to perform 5 times sit to stand to 20 seconds without UE use in order to decrease her risk for falls.     Time 6   Period Weeks   Status New   PT LONG TERM GOAL #3   Title Patient will improve functional gait assessment by 5 pts. in order to attain MCID for stroke and to decrease fear of falling and improve ease of community ambulation.   Time 6   Period Weeks   Status New   PT LONG TERM GOAL #4   Title Patient will increase overall strength to 4+/5 in both UE and LE in order to complete transfers safely with decreased risk for falls.    Time 6   Period Weeks   Status New   PT LONG TERM GOAL #5   Title Patient will be able to negotiate one 4" curb with no UE support independently in a safe manner in order to allow for greater community negotiation safely.    Time 6   Period Weeks   Status New               Plan - 01/31/16 [id number]    Clinical Impression Statement Pt demonstrates good understanding of all exercises today and reported that this is the first time shes felt comfortable "walking without thinking".  She has increased difficulty with resisted walking to the  L and required CGA for stability.  Pt continues to demonstrate fear of falling, pt was unable to perform lateral stepping over balance beam with red theraband but after taking the beam away, pt was able to take large enough steps to clear the beam.  Pt continues to demosntrate difficulty with dynamic balance activites and has LOB after she begins to believe she is unstable.  Reeducated pt on  HEP and ability to perform resisted ankle strengthening by holding the theraband herself rather than tying to her bed.  She would continue to benefit from skilled physical therapy to increase her overall LE strength and practice dynamic balance so that she gains confidence for safer ambulation.     Rehab Potential Good   Clinical Impairments Affecting Rehab Potential Positive Factors: exercises regularly, motivated Negative Factors: chronic stroke, anxiety, depression Clinical Impression: Evolving-multiple falls, decrease strength, moderate perception of handicap on DHI   PT Frequency 2x / week   PT Duration 6 weeks   PT Treatment/Interventions ADLs/Self Care Home Management;Aquatic Therapy;Cryotherapy;Electrical Stimulation;Moist Heat;Gait training;Stair training;Functional mobility training;Therapeutic activities;Therapeutic exercise;Balance training;Neuromuscular re-education;Patient/family education;Manual techniques;Energy conservation   PT Next Visit Plan See 2x for 4 weeks, decrease to 1x 2 weeks, if safe to do so. Balance, strength, dual task ambulation   PT Home Exercise Plan reinfoced HEP    Consulted and Agree with Plan of Care Patient      Patient will benefit from skilled therapeutic intervention in order to improve the following deficits and impairments:  Abnormal gait, Decreased activity tolerance, Decreased balance, Decreased coordination, Decreased endurance, Decreased mobility, Decreased safety awareness, Decreased strength, Impaired sensation, Postural dysfunction, Improper body mechanics,  Pain  Visit Diagnosis: Unsteadiness on feet  Muscle weakness (generalized)     Problem List Patient Active Problem List   Diagnosis Date Noted  . Frozen shoulder 12/26/2015  . Osteoporosis 12/26/2015  . Multiple fractures 12/26/2015  . ANXIETY DEPRESSION 04/10/2010  . CEREBRAL EMBOLISM WITHOUT MENTION INFARCT 04/10/2010  . Atrial fibrillation ([place]) 03/11/2010  . FATIGUE / MALAISE 03/11/2010   Stacy Gardner, SPT  This entire session was performed under direct supervision and direction of a licensed therapist/therapist assistant . I have personally read, edited and approve of the note as written.   Stacy Gardner 01/31/2016, 8:56 AM  Neoma Laming, PT, DPT  01/31/2016, 10:38 AM [phone]  [place] MAIN [facility] SERVICES [address] [place], [place], [address] Phone: [phone]   Fax:  [phone]  Name: Monica Whitaker MRN: [id number] Date of Birth: February 18, 1946

## 2016-02-05 ENCOUNTER — Other Ambulatory Visit: Payer: Self-pay | Admitting: Family Medicine

## 2016-02-06 ENCOUNTER — Encounter: Payer: Self-pay | Admitting: Physical Therapy

## 2016-02-06 ENCOUNTER — Ambulatory Visit: Payer: Medicare Other | Attending: Family Medicine | Admitting: Physical Therapy

## 2016-02-06 DIAGNOSIS — M6281 Muscle weakness (generalized): Secondary | ICD-10-CM

## 2016-02-06 DIAGNOSIS — R2681 Unsteadiness on feet: Secondary | ICD-10-CM | POA: Insufficient documentation

## 2016-02-06 NOTE — Therapy (Signed)
[place] MAIN [facility] SERVICES [address] [place], [place], [address] Phone: [phone]   Fax:  [phone]  Physical Therapy Treatment  Patient Details  Name: Monica Whitaker MRN: [id number] Date of Birth: 12/05/1945 Referring Provider: Lucille Passy  Encounter Date: 02/06/2016      PT End of Session - 02/06/16 0906    Visit Number 6   Number of Visits 13   Date for PT Re-Evaluation 02/26/16   Authorization Type G code 6   Authorization Time Period 10   PT Start Time 0806   PT Stop Time 0851   PT Time Calculation (min) 45 min   Equipment Utilized During Treatment Gait belt   Activity Tolerance Patient tolerated treatment well   Behavior During Therapy [facility] for tasks assessed/performed      Past Medical History  Diagnosis Date  . 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 Atrial fibrillation ([name])     paroxysmal, failed medical therapy with flecainide,  NSVT with tikosyn  . Thyroid disease   . Bruises easily   . Depression     medically controlled   . Anxiety     controlled with meds  . Low blood pressure     no falls, but if gets up to fast  . CVA (cerebral vascular accident) ([place]) 12/2007  . Stroke [facility]) 7 years ago    Lasting effects on balance, different personality.    Past Surgical History  Procedure Laterality Date  . Vaginal hysterectomy    . Tonsillectomy    . Tee without cardioversion N/A 10/18/2012    Procedure: TRANSESOPHAGEAL ECHOCARDIOGRAM (TEE);  Surgeon: Lelon Perla, MD;  Location: [facility] ENDOSCOPY;  Service: Cardiovascular;  Laterality: N/A;  Pre-Ablation at 12pm  . Atrial fibrillation ablation  10/18/12    PVI by Dr Rayann Heman  . Atrial fibrillation ablation N/A 10/18/2012    Procedure: ATRIAL FIBRILLATION ABLATION;  Surgeon: Thompson Grayer, MD;  Location: [facility] CATH LAB;  Service: Cardiovascular;  Laterality: N/A;  . Open reduction internal fixation (orif) distal radial fracture  Right 11/15/2014    Procedure: OPEN REDUCTION INTERNAL FIXATION (ORIF) RIGHT DISTAL RADIAL FRACTURE WITH ALLOGRAFT BONE GRAFT;  Surgeon: Roseanne Kaufman, MD;  Location: [place];  Service: Orthopedics;  Laterality: Right;  . Eye surgery      on L/R eye, macular hole     There were no vitals filed for this visit.      Subjective Assessment - 02/06/16 0809    Subjective Pt denies falls and reports taking one day off. Patient is going to a LE strengthening and balance exercise class. Patient reports she feels safer with walking with less focus on technique of walking.   Pertinent History Pertinent Factors Relating to Rehab: depression, anxiety, caregiver to husband, chronic stroke   Limitations Standing;Walking   How long can you sit comfortably? N/A, unsteady when getting up   How long can you stand comfortably? 45 min., Careful for long periods of time, gets fatigued    How long can you walk comfortably? 1 mi., couple times a weeks   Diagnostic tests DEXA scheduled for 2 weeks, Shoulder: Mild acromioclavicular and glenohumeral degenerative change.   Patient Stated Goals "I don't want to be fearful when I'm moving"   Currently in Pain? No/denies       Treatment:  Warm up on NuStep L3, x 4 min. B UE/LE.  Hip flexion against green resistance, 2 x 10 each LE, instructed to perform with hand support for stability and increase range.  Calf raises and ankle stretches into DF on 4" box, 2x 10, instructed to hold DF stretch for 2-3 seconds to increase ankle DF range.  Monster walks over wooden plank with green tband resistance, forward and backward x 2 lengths of the plank, required min VCs to perform with increased range in order to allow opposite leg to cross safely. Min assist to maintain balance when moving backwards.  Lateral and backwards resisted walking x 2 and forward walking x 1, 7.5#, min assist to maintain stability especially when walking backwards and sideways  with L foot leading.  Forward resisted walking with 2.5#, with step up, patient required 1 hand held assist from SPT due to fear. Increased difficulty performing with LLE.   Single leg stance on blue dynadisc, 1 UE support, while standing on dynadisc patient instructed to perform UE ball tosses, x 8 tosses each LE, min assist to maintain stability.  Dynamic walking on airex balance beam, heel to toe, forwards and backwards x 2 passes; min assist to maintain stability; light 1-2 UE support; instructed to lessen use of UEs.  Single leg stance firm surface, LLE:15 seconds, RLE: 5-7 seconds. Decreased stability on the R with increased support required for stability. Hip drop revealing RLE glut med weakness.  Throughout session, patient required min assist for stability and min VCs to perform exercises appropriately.                          PT Education - 02/06/16 0905    Education provided Yes   Education Details safety, exercise   Person(s) Educated Patient   Methods Explanation;Tactile cues;Demonstration   Comprehension Verbalized understanding;Verbal cues required             PT Long Term Goals - 01/15/16 1201    PT LONG TERM GOAL #1   Title Patient will be independent in HEP in order to increase patient's ability to maintain gains achieved in therapy and assist with return to PLOF.    Time 6   Period Weeks   Status New   PT LONG TERM GOAL #2   Title Patient will decrease time take to perform 5 times sit to stand to 20 seconds without UE use in order to decrease her risk for falls.     Time 6   Period Weeks   Status New   PT LONG TERM GOAL #3   Title Patient will improve functional gait assessment by 5 pts. in order to attain MCID for stroke and to decrease fear of falling and improve ease of community ambulation.   Time 6   Period Weeks   Status New   PT LONG TERM GOAL #4   Title Patient will increase overall strength to 4+/5 in both UE and LE in  order to complete transfers safely with decreased risk for falls.    Time 6   Period Weeks   Status New   PT LONG TERM GOAL #5   Title Patient will be able to negotiate one 4" curb with no UE support independently in a safe manner in order to allow for greater community negotiation safely.    Time 6   Period Weeks   Status New               Plan - 02/06/16 [id number]    Clinical Impression  Statement Patient shows improved quality and increased confidence when performing balance exercises. Patient does still show fear with step ups and when using RLE, but she is still willing to do activities and shows less fear than previously. Patient still requires encouragement to perform task without UE use as much as possible in order to increase the challenge to her balance. Patient is making improvements in single leg stance time although she has glut medius weakness on RLE, which is evident by hip drop on L with stance on RLE. Throughout treatment, patient requires min assist with to maintain stability from shifts from midline and min VCs to perform exercises with appropriate technique. Patient would continue to benefit from skilled PT in order to  increase LE strength (R>L), improve dynamic balance, and decrease fear of falling in order to increase her ability to perform ADLs safely.   Rehab Potential Good   Clinical Impairments Affecting Rehab Potential Positive Factors: exercises regularly, motivated Negative Factors: chronic stroke, anxiety, depression Clinical Impression: Evolving-multiple falls, decrease strength, moderate perception of handicap on DHI   PT Frequency 2x / week   PT Duration 6 weeks   PT Treatment/Interventions ADLs/Self Care Home Management;Aquatic Therapy;Cryotherapy;Electrical Stimulation;Moist Heat;Gait training;Stair training;Functional mobility training;Therapeutic activities;Therapeutic exercise;Balance training;Neuromuscular re-education;Patient/family education;Manual  techniques;Energy conservation   PT Next Visit Plan See 2x for 4 weeks, decrease to 1x 2 weeks, if safe to do so. Balance, strength, dual task ambulation   PT Home Exercise Plan reinfoced HEP    Consulted and Agree with Plan of Care Patient      Patient will benefit from skilled therapeutic intervention in order to improve the following deficits and impairments:  Abnormal gait, Decreased activity tolerance, Decreased balance, Decreased coordination, Decreased endurance, Decreased mobility, Decreased safety awareness, Decreased strength, Impaired sensation, Postural dysfunction, Improper body mechanics, Pain  Visit Diagnosis: Unsteadiness on feet  Muscle weakness (generalized)     Problem List Patient Active Problem List   Diagnosis Date Noted  . Frozen shoulder 12/26/2015  . Osteoporosis 12/26/2015  . Multiple fractures 12/26/2015  . ANXIETY DEPRESSION 04/10/2010  . CEREBRAL EMBOLISM WITHOUT MENTION INFARCT 04/10/2010  . Atrial fibrillation ([place]) 03/11/2010  . FATIGUE / MALAISE 03/11/2010   Tilman Neat, SPT This entire session was performed under direct supervision and direction of a licensed therapist/therapist assistant . I have personally read, edited and approve of the note as written.  Trotter,Margaret PT, PDT 02/06/2016, 12:44 PM  [place] MAIN [facility] SERVICES [address] [place], [place], [address] Phone: [phone]   Fax:  [phone]  Name: JANIYLA DONDLINGER MRN: [id number] Date of Birth: [birth year]

## 2016-02-11 ENCOUNTER — Encounter: Payer: Self-pay | Admitting: Physical Therapy

## 2016-02-11 ENCOUNTER — Ambulatory Visit: Payer: Medicare Other | Admitting: Physical Therapy

## 2016-02-11 ENCOUNTER — Encounter: Payer: Medicare Other | Admitting: Physical Therapy

## 2016-02-11 DIAGNOSIS — M6281 Muscle weakness (generalized): Secondary | ICD-10-CM

## 2016-02-11 DIAGNOSIS — R2681 Unsteadiness on feet: Secondary | ICD-10-CM | POA: Diagnosis not present

## 2016-02-11 NOTE — Patient Instructions (Addendum)
  Flexion - Standing    Stand and support self while swinging uninvolved leg and hip forward  _10__ times. Repeat with involved leg and hip. Do __2_ times per day.  Copyright  VHI. All rights reserved.  EXTENSION: Standing - Resistance Band (Active)    Stand, both feet flat. Against yellow resistance band, draw right leg behind body as far as possible. Complete __2_ sets of _10__ repetitions. Perform __2_ sessions per day.  http://gtsc.exer.us/83   Copyright  VHI. All rights reserved.  Sideward Walking    Walk sideways, both directions. Step out with side of one foot and slide other foot over to meet it. Add green resistance band and slight knee bend.   Copyright  VHI. All rights reserved.

## 2016-02-11 NOTE — Therapy (Signed)
[place] MAIN [facility] SERVICES [address] [place], [place], [address] Phone: [phone]   Fax:  [phone]  Physical Therapy Treatment  Patient Details  Name: Monica Whitaker MRN: [id number] Date of Birth: Jan 04, 1946 Referring Provider: Lucille Passy  Encounter Date: 02/11/2016      PT End of Session - 02/11/16 1720    Visit Number 7   Number of Visits 13   Date for PT Re-Evaluation 02/26/16   Authorization Type G code 7   Authorization Time Period 10   PT Start Time 1600   PT Stop Time 1646   PT Time Calculation (min) 46 min   Equipment Utilized During Treatment Gait belt   Activity Tolerance Patient tolerated treatment well   Behavior During Therapy [facility] for tasks assessed/performed      Past Medical History  Diagnosis Date  . 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 Atrial fibrillation ([name])     paroxysmal, failed medical therapy with flecainide,  NSVT with tikosyn  . Thyroid disease   . Bruises easily   . Depression     medically controlled   . Anxiety     controlled with meds  . Low blood pressure     no falls, but if gets up to fast  . CVA (cerebral vascular accident) ([place]) 12/2007  . Stroke [facility]) 7 years ago    Lasting effects on balance, different personality.    Past Surgical History  Procedure Laterality Date  . Vaginal hysterectomy    . Tonsillectomy    . Tee without cardioversion N/A 10/18/2012    Procedure: TRANSESOPHAGEAL ECHOCARDIOGRAM (TEE);  Surgeon: Lelon Perla, MD;  Location: [facility] ENDOSCOPY;  Service: Cardiovascular;  Laterality: N/A;  Pre-Ablation at 12pm  . Atrial fibrillation ablation  10/18/12    PVI by Dr Rayann Heman  . Atrial fibrillation ablation N/A 10/18/2012    Procedure: ATRIAL FIBRILLATION ABLATION;  Surgeon: Thompson Grayer, MD;  Location: [facility] CATH LAB;  Service: Cardiovascular;  Laterality: N/A;  . Open reduction internal fixation (orif) distal radial fracture  Right 11/15/2014    Procedure: OPEN REDUCTION INTERNAL FIXATION (ORIF) RIGHT DISTAL RADIAL FRACTURE WITH ALLOGRAFT BONE GRAFT;  Surgeon: Roseanne Kaufman, MD;  Location: [place];  Service: Orthopedics;  Laterality: Right;  . Eye surgery      on L/R eye, macular hole     There were no vitals filed for this visit.      Subjective Assessment - 02/11/16 1603    Subjective Pt denies falls and that she is doing HEP well. She notes legs are fatigued due to patient going to a LE strengthening class today.    Pertinent History Pertinent Factors Relating to Rehab: depression, anxiety, caregiver to husband, chronic stroke   Limitations Standing;Walking   How long can you sit comfortably? N/A, unsteady when getting up   How long can you stand comfortably? 45 min., Careful for long periods of time, gets fatigued    How long can you walk comfortably? 1 mi., couple times a weeks   Diagnostic tests DEXA scheduled for 2 weeks, Shoulder: Mild acromioclavicular and glenohumeral degenerative change.   Patient Stated Goals "I don't want to be fearful when I'm moving"   Currently in Pain? No/denies      Treatment:  Warm up on NuStep L4, x 3 min. B UE/LE (unbilled)  Forward, backward, sideways (each LE leading) resisted walking with  7.5#, x 2 each direction Resisted step ups with 2.5#, with 1 HHA from SPT, decreased support compared to last session  Step overs x 8 on 4" step, each LE leading, no UE use-light UE touches prn, increase VCs to perform improved eccentric control with step down.    Stairs x 3, instructed to perform with no UE support and step over step pattern, mild shifts from midline upon descent with min assist to maintain stability. Increased fear with descent.   4 way hip except hip add, with red tband x 10, RLE only, 2 HHA. Patient already had hip ABD re-educated on technique. Requires min VCs to perform exercises correctly.    Resisted lateral walking with red tband  (x1-increased resistance due to ease) green tband x 2 each direction, given for home use, VCs to continue with slight knee bend throughout exercise.  BAPS board anterior/posterior and lateral taps, 2 x 10 each direction, each LE used, requires 2 HHA and min assist from SPT to maintain stability.   Squats on airex pad, x 10, no HHA, mod VCs to increase hip flexion and pushing bottom posteriorly in order to increase even weight bearing and decrease stress on knees.   Heel/toe raises on flat surface, required 1-2 HHA on bar to maintain stability especially into toe raises. Min VCs to increase range in PF/DF and control ascent/descent.                                    PT Education - 02/11/16 1719    Education provided Yes   Education Details HEP progression   Person(s) Educated Patient   Methods Explanation;Verbal cues   Comprehension Verbalized understanding;Verbal cues required             PT Long Term Goals - 01/15/16 1201    PT LONG TERM GOAL #1   Title Patient will be independent in HEP in order to increase patient's ability to maintain gains achieved in therapy and assist with return to PLOF.    Time 6   Period Weeks   Status New   PT LONG TERM GOAL #2   Title Patient will decrease time take to perform 5 times sit to stand to 20 seconds without UE use in order to decrease her risk for falls.     Time 6   Period Weeks   Status New   PT LONG TERM GOAL #3   Title Patient will improve functional gait assessment by 5 pts. in order to attain MCID for stroke and to decrease fear of falling and improve ease of community ambulation.   Time 6   Period Weeks   Status New   PT LONG TERM GOAL #4   Title Patient will increase overall strength to 4+/5 in both UE and LE in order to complete transfers safely with decreased risk for falls.    Time 6   Period Weeks   Status New   PT LONG TERM GOAL #5   Title Patient will be able to negotiate one 4" curb  with no UE support independently in a safe manner in order to allow for greater community negotiation safely.    Time 6   Period Weeks   Status New               Plan - 02/11/16 1720    Clinical Impression Statement Patient continues to demonstrate improvements in confidence when  performing balance exercises; however she does note fear with step ups and steps when instructed to perform step over step pattern. Patient requires less encouragement and less physical assistance to perform activities. Patient does require min assist to maintain balance with exercises and with mild loss of balance, patient becomes very anxious. Patient required demonstration and min VCs to perform activities with improved form and technique in order to perform correct movements. Patient would continue to benefit from skilled PT intevention in order to increase LE strength (R>L), improve dynamic balance, increase confidence with movement, and perform ADLs with decreased fear and greater independence.    Rehab Potential Good   Clinical Impairments Affecting Rehab Potential Positive Factors: exercises regularly, motivated Negative Factors: chronic stroke, anxiety, depression Clinical Impression: Evolving-multiple falls, decrease strength, moderate perception of handicap on DHI   PT Frequency 2x / week   PT Duration 6 weeks   PT Treatment/Interventions ADLs/Self Care Home Management;Aquatic Therapy;Cryotherapy;Electrical Stimulation;Moist Heat;Gait training;Stair training;Functional mobility training;Therapeutic activities;Therapeutic exercise;Balance training;Neuromuscular re-education;Patient/family education;Manual techniques;Energy conservation   PT Next Visit Plan See 2x for 4 weeks, decrease to 1x 2 weeks, if safe to do so. Dynamic balance, strength, dual task ambulation, glut med on RLE   PT Home Exercise Plan re-educated on HEP, progressed HEP    Consulted and Agree with Plan of Care Patient      Patient will  benefit from skilled therapeutic intervention in order to improve the following deficits and impairments:  Abnormal gait, Decreased activity tolerance, Decreased balance, Decreased coordination, Decreased endurance, Decreased mobility, Decreased safety awareness, Decreased strength, Impaired sensation, Postural dysfunction, Improper body mechanics, Pain  Visit Diagnosis: Unsteadiness on feet  Muscle weakness (generalized)     Problem List Patient Active Problem List   Diagnosis Date Noted  . Frozen shoulder 12/26/2015  . Osteoporosis 12/26/2015  . Multiple fractures 12/26/2015  . ANXIETY DEPRESSION 04/10/2010  . CEREBRAL EMBOLISM WITHOUT MENTION INFARCT 04/10/2010  . Atrial fibrillation ([place]) 03/11/2010  . FATIGUE / MALAISE 03/11/2010   Tilman Neat, SPT This entire session was performed under direct supervision and direction of a licensed therapist/therapist assistant . I have personally read, edited and approve of the note as written.  Trotter,Margaret PT, DPT 02/12/2016, 9:22 AM  [place] MAIN [facility] SERVICES [address] [place], [place], [address] Phone: [phone]   Fax:  [phone]  Name: ROSEMOND MARSH MRN: [id number] Date of Birth: 1945/08/13

## 2016-02-13 ENCOUNTER — Encounter: Payer: Self-pay | Admitting: Physical Therapy

## 2016-02-13 ENCOUNTER — Encounter: Payer: Medicare Other | Admitting: Physical Therapy

## 2016-02-13 ENCOUNTER — Ambulatory Visit: Payer: Medicare Other | Admitting: Physical Therapy

## 2016-02-13 DIAGNOSIS — M6281 Muscle weakness (generalized): Secondary | ICD-10-CM | POA: Diagnosis not present

## 2016-02-13 DIAGNOSIS — R2681 Unsteadiness on feet: Secondary | ICD-10-CM | POA: Diagnosis not present

## 2016-02-13 NOTE — Therapy (Signed)
[place] MAIN [facility] SERVICES [address] [place], [place], [address] Phone: [phone]   Fax:  [phone]  Physical Therapy Treatment  Patient Details  Name: Monica Whitaker MRN: [id number] Date of Birth: [birth year] Referring Provider: Lucille Passy  Encounter Date: 02/13/2016      PT End of Session - 02/13/16 1351    Visit Number 8   Number of Visits 13   Date for PT Re-Evaluation 02/26/16   Authorization Type G code 8   Authorization Time Period 10   PT Start Time 1300   PT Stop Time 1345   PT Time Calculation (min) 45 min   Equipment Utilized During Treatment Gait belt   Activity Tolerance Patient tolerated treatment well   Behavior During Therapy [facility] for tasks assessed/performed      Past Medical History  Diagnosis Date  . 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 Atrial fibrillation ([name])     paroxysmal, failed medical therapy with flecainide,  NSVT with tikosyn  . Thyroid disease   . Bruises easily   . Depression     medically controlled   . Anxiety     controlled with meds  . Low blood pressure     no falls, but if gets up to fast  . CVA (cerebral vascular accident) ([place]) 12/2007  . Stroke [facility]) 7 years ago    Lasting effects on balance, different personality.    Past Surgical History  Procedure Laterality Date  . Vaginal hysterectomy    . Tonsillectomy    . Tee without cardioversion N/A 10/18/2012    Procedure: TRANSESOPHAGEAL ECHOCARDIOGRAM (TEE);  Surgeon: Lelon Perla, MD;  Location: [facility] ENDOSCOPY;  Service: Cardiovascular;  Laterality: N/A;  Pre-Ablation at 12pm  . Atrial fibrillation ablation  10/18/12    PVI by Dr Rayann Heman  . Atrial fibrillation ablation N/A 10/18/2012    Procedure: ATRIAL FIBRILLATION ABLATION;  Surgeon: Thompson Grayer, MD;  Location: [facility] CATH LAB;  Service: Cardiovascular;  Laterality: N/A;  . Open reduction internal fixation (orif) distal radial fracture  Right 11/15/2014    Procedure: OPEN REDUCTION INTERNAL FIXATION (ORIF) RIGHT DISTAL RADIAL FRACTURE WITH ALLOGRAFT BONE GRAFT;  Surgeon: Roseanne Kaufman, MD;  Location: [place];  Service: Orthopedics;  Laterality: Right;  . Eye surgery      on L/R eye, macular hole     There were no vitals filed for this visit.      Subjective Assessment - 02/13/16 1304    Subjective Pt reports no issue with the new HEP.  She reports going to an exercise class this morning and going to the gym to do the leg press all before therapy.     Pertinent History Pertinent Factors Relating to Rehab: depression, anxiety, caregiver to husband, chronic stroke   Limitations Standing;Walking   How long can you sit comfortably? N/A, unsteady when getting up   How long can you stand comfortably? 45 min., Careful for long periods of time, gets fatigued    How long can you walk comfortably? 1 mi., couple times a weeks   Diagnostic tests DEXA scheduled for 2 weeks, Shoulder: Mild acromioclavicular and glenohumeral degenerative change.   Patient Stated Goals "I don't want to be fearful when I'm moving"       Treatment   Nustep x 4 mins level 3 BUE/BLE (unbilled)  Forward step ups on bosu ball, 1 set x  10 reps leading with BLEs, 2 HHA, min VCs to decrease UE support   Lateral step ups on bosu ball, 1 set x 10 reps leading with BLEs, min VCs for upright posture   Step ups onto 4 inch step to simulate walking upstairs without UE support, CGA for patient comfort, min VCs to increase speed for increased stability, 10 reps leading with each LE  Dual task walking, 2 laps self ball toss, 2 laps ball bouncing, 1 lap with anterior/posterior head turns, 1 lap with lateral head turns, CGA for safety, pt demonstrated increased difficulty with anterior/posterior head turns   Ball toss against wall standing on airex pad x 10 passes feet comfortable width, 10 passes feet together x 2 sets.  CGA for feet together  standing, min VCs for upright posture   Cone taps anterior/lateral/posterior standing on airex pad with 1 HHA, 10 rep with each LE, min VCs to maintain single leg stance with each rep, CGA for safety    Reinforced HEP, added single leg leg press at gym                        PT Education - 02/13/16 1350    Education provided Yes   Education Details single leg leg press, dual tasking    Person(s) Educated Patient   Methods Demonstration;Explanation;Verbal cues   Comprehension Verbalized understanding;Returned demonstration;Verbal cues required             PT Long Term Goals - 01/15/16 1201    PT LONG TERM GOAL #1   Title Patient will be independent in HEP in order to increase patient's ability to maintain gains achieved in therapy and assist with return to PLOF.    Time 6   Period Weeks   Status New   PT LONG TERM GOAL #2   Title Patient will decrease time take to perform 5 times sit to stand to 20 seconds without UE use in order to decrease her risk for falls.     Time 6   Period Weeks   Status New   PT LONG TERM GOAL #3   Title Patient will improve functional gait assessment by 5 pts. in order to attain MCID for stroke and to decrease fear of falling and improve ease of community ambulation.   Time 6   Period Weeks   Status New   PT LONG TERM GOAL #4   Title Patient will increase overall strength to 4+/5 in both UE and LE in order to complete transfers safely with decreased risk for falls.    Time 6   Period Weeks   Status New   PT LONG TERM GOAL #5   Title Patient will be able to negotiate one 4" curb with no UE support independently in a safe manner in order to allow for greater community negotiation safely.    Time 6   Period Weeks   Status New               Plan - 02/13/16 1351    Clinical Impression Statement Pt continues to require frequent encouragement to attempt balance activites without the use of her UEs.  With increased  encouragement pt was able to perform 4 inch step ups/step downs without UE assist or LOB.  Pt performed dual task walking without any LOB and felt as though she performed better when she "wasnt thinking about walking".  Pt performed anterior/lateral/posterior cone taps standing on airex pad to work  towards increasing single leg stance.  Advised pt that she could perform single leg leg press in the gym in her community as long as she decreases the weight.  Pt would continue to benefit from skilled physical therapy to increase her LE strength, dynamic balance, and confidence for greater functional mobility.     Rehab Potential Good   Clinical Impairments Affecting Rehab Potential Positive Factors: exercises regularly, motivated Negative Factors: chronic stroke, anxiety, depression Clinical Impression: Evolving-multiple falls, decrease strength, moderate perception of handicap on DHI   PT Frequency 2x / week   PT Duration 6 weeks   PT Treatment/Interventions ADLs/Self Care Home Management;Aquatic Therapy;Cryotherapy;Electrical Stimulation;Moist Heat;Gait training;Stair training;Functional mobility training;Therapeutic activities;Therapeutic exercise;Balance training;Neuromuscular re-education;Patient/family education;Manual techniques;Energy conservation   PT Next Visit Plan See 2x for 4 weeks, decrease to 1x 2 weeks, if safe to do so. Dynamic balance, strength, dual task ambulation, glut med on RLE   PT Home Exercise Plan re-educated on HEP, progressed HEP    Consulted and Agree with Plan of Care Patient      Patient will benefit from skilled therapeutic intervention in order to improve the following deficits and impairments:  Abnormal gait, Decreased activity tolerance, Decreased balance, Decreased coordination, Decreased endurance, Decreased mobility, Decreased safety awareness, Decreased strength, Impaired sensation, Postural dysfunction, Improper body mechanics, Pain  Visit Diagnosis: Unsteadiness  on feet  Muscle weakness (generalized)     Problem List Patient Active Problem List   Diagnosis Date Noted  . Frozen shoulder 12/26/2015  . Osteoporosis 12/26/2015  . Multiple fractures 12/26/2015  . ANXIETY DEPRESSION 04/10/2010  . CEREBRAL EMBOLISM WITHOUT MENTION INFARCT 04/10/2010  . Atrial fibrillation ([place]) 03/11/2010  . FATIGUE / MALAISE 03/11/2010   Stacy Gardner, SPT  This entire session was performed under direct supervision and direction of a licensed therapist/therapist assistant . I have personally read, edited and approve of the note as written.  Trotter,Margaret PT, DPT 02/13/2016, 2:20 PM  [place] MAIN [facility] SERVICES [address] [place], [place], [address] Phone: [phone]   Fax:  [phone]  Name: DEESHA WERKHEISER MRN: [id number] Date of Birth: 07/17/1946

## 2016-02-17 ENCOUNTER — Ambulatory Visit
Admission: RE | Admit: 2016-02-17 | Discharge: 2016-02-17 | Disposition: A | Discharge status: Home / Self Care | Payer: Medicare Other | Source: Ambulatory Visit | Attending: Internal Medicine | Admitting: Internal Medicine

## 2016-02-17 DIAGNOSIS — I4891 Unspecified atrial fibrillation: Secondary | ICD-10-CM | POA: Diagnosis not present

## 2016-02-17 DIAGNOSIS — Z780 Asymptomatic menopausal state: Secondary | ICD-10-CM | POA: Insufficient documentation

## 2016-02-17 DIAGNOSIS — Z79899 Other long term (current) drug therapy: Secondary | ICD-10-CM | POA: Diagnosis not present

## 2016-02-17 DIAGNOSIS — M810 Age-related osteoporosis without current pathological fracture: Secondary | ICD-10-CM | POA: Diagnosis not present

## 2016-02-17 DIAGNOSIS — E2839 Other primary ovarian failure: Secondary | ICD-10-CM | POA: Diagnosis not present

## 2016-02-18 ENCOUNTER — Encounter: Payer: Medicare Other | Admitting: Physical Therapy

## 2016-02-19 ENCOUNTER — Encounter: Payer: Self-pay | Admitting: Physical Therapy

## 2016-02-19 ENCOUNTER — Ambulatory Visit: Payer: Medicare Other | Admitting: Physical Therapy

## 2016-02-19 ENCOUNTER — Encounter: Payer: Medicare Other | Admitting: Physical Therapy

## 2016-02-19 DIAGNOSIS — R2681 Unsteadiness on feet: Secondary | ICD-10-CM | POA: Diagnosis not present

## 2016-02-19 DIAGNOSIS — M6281 Muscle weakness (generalized): Secondary | ICD-10-CM | POA: Diagnosis not present

## 2016-02-19 NOTE — Therapy (Signed)
[place] MAIN [facility] SERVICES [address] [place], [place], [address] Phone: [phone]   Fax:  [phone]  Physical Therapy Treatment  Patient Details  Name: Monica Whitaker MRN: [id number] Date of Birth: 21-Mar-1946 Referring Provider: Lucille Passy  Encounter Date: 02/19/2016      PT End of Session - 02/19/16 1541    Visit Number 9   Number of Visits 13   Date for PT Re-Evaluation 02/26/16   Authorization Type G code 9   Authorization Time Period 10   PT Start Time [id number]   PT Stop Time 1530   PT Time Calculation (min) 45 min   Equipment Utilized During Treatment Gait belt   Activity Tolerance Patient tolerated treatment well   Behavior During Therapy [facility] for tasks assessed/performed      Past Medical History  Diagnosis Date  . 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 Atrial fibrillation ([name])     paroxysmal, failed medical therapy with flecainide,  NSVT with tikosyn  . Thyroid disease   . Bruises easily   . Depression     medically controlled   . Anxiety     controlled with meds  . Low blood pressure     no falls, but if gets up to fast  . CVA (cerebral vascular accident) ([place]) 12/2007  . Stroke [facility]) 7 years ago    Lasting effects on balance, different personality.    Past Surgical History  Procedure Laterality Date  . Vaginal hysterectomy    . Tonsillectomy    . Tee without cardioversion N/A 10/18/2012    Procedure: TRANSESOPHAGEAL ECHOCARDIOGRAM (TEE);  Surgeon: Lelon Perla, MD;  Location: [facility] ENDOSCOPY;  Service: Cardiovascular;  Laterality: N/A;  Pre-Ablation at 12pm  . Atrial fibrillation ablation  10/18/12    PVI by Dr Rayann Heman  . Atrial fibrillation ablation N/A 10/18/2012    Procedure: ATRIAL FIBRILLATION ABLATION;  Surgeon: Thompson Grayer, MD;  Location: [facility] CATH LAB;  Service: Cardiovascular;  Laterality: N/A;  . Open reduction internal fixation (orif) distal radial fracture  Right 11/15/2014    Procedure: OPEN REDUCTION INTERNAL FIXATION (ORIF) RIGHT DISTAL RADIAL FRACTURE WITH ALLOGRAFT BONE GRAFT;  Surgeon: Roseanne Kaufman, MD;  Location: [place];  Service: Orthopedics;  Laterality: Right;  . Eye surgery      on L/R eye, macular hole     There were no vitals filed for this visit.      Subjective Assessment - 02/19/16 1446    Subjective Pt reports that she went to water aerobics this morning.  She felt like she pulled a muscle in her ankle several days ago but it feels better today.     Pertinent History Pertinent Factors Relating to Rehab: depression, anxiety, caregiver to husband, chronic stroke   Limitations Standing;Walking   How long can you sit comfortably? N/A, unsteady when getting up   How long can you stand comfortably? 45 min., Careful for long periods of time, gets fatigued    How long can you walk comfortably? 1 mi., couple times a weeks   Diagnostic tests DEXA scheduled for 2 weeks, Shoulder: Mild acromioclavicular and glenohumeral degenerative change.   Patient Stated Goals "I don't want to be fearful when I'm moving"   Currently in Pain? No/denies       Treatment  Resisted walking sidestepping x 2 laps in both directions #12.5, CGA for stability, min VCs  to increase step length  Lateral drills:  -Abduction stepping x 2 laps leading with BLEs, CGA for stability, min VCs to increase speed  -Forwards/backwards walking x 2 laps leading with BLEs, CGA for stability, min VCs to increase step length  Monster walking over balance beam forwards/backwards x 2 laps, close supervision for safety, min VCs for increased step length  Lateral step downs off 4 inch step, 2 set x 10 reps BLEs, min VCs for squat position and eccentric control  Sidestepping down balance beam x 2 laps leading with BLEs while self tossing ball, CGA for stability  Single leg balance practice x 3 each LE, pt able to stand on LLE for 15 seconds and RLE for 5  seconds before requiring HHA  Standing bosu step ups x 10 reps alternating LEs, 1 HHA, min VCs for upright posture  Dynamic lunges x 10 with 1 HHA, PT demonstrated and emphasized powerful movement and correct posture, pt demonstrated proper form via teach back method                           PT Education - 02/19/16 1540    Education provided Yes   Education Details HEP, proper lunges    Person(s) Educated Patient   Methods Explanation;Demonstration;Verbal cues   Comprehension Verbalized understanding;Returned demonstration;Verbal cues required             PT Long Term Goals - 01/15/16 1201    PT LONG TERM GOAL #1   Title Patient will be independent in HEP in order to increase patient's ability to maintain gains achieved in therapy and assist with return to PLOF.    Time 6   Period Weeks   Status New   PT LONG TERM GOAL #2   Title Patient will decrease time take to perform 5 times sit to stand to 20 seconds without UE use in order to decrease her risk for falls.     Time 6   Period Weeks   Status New   PT LONG TERM GOAL #3   Title Patient will improve functional gait assessment by 5 pts. in order to attain MCID for stroke and to decrease fear of falling and improve ease of community ambulation.   Time 6   Period Weeks   Status New   PT LONG TERM GOAL #4   Title Patient will increase overall strength to 4+/5 in both UE and LE in order to complete transfers safely with decreased risk for falls.    Time 6   Period Weeks   Status New   PT LONG TERM GOAL #5   Title Patient will be able to negotiate one 4" curb with no UE support independently in a safe manner in order to allow for greater community negotiation safely.    Time 6   Period Weeks   Status New               Plan - 02/19/16 1541    Clinical Impression Statement Pt continues to make progress in therapy.  Today she felt like she was in a "funk" and had to think about her walking more  over the last several days.  Pt was able to perform monster walks over beam with red theraband resistance without LOB.  Advanced balance today by adding sidestepping and backwards stepping in and out of ladder.  Pt continues to struggle with single leg balance more so on her RLE than LLE.  She would continue to benefit from further skilled physical therapy to work on her dynamic balance to improve her confidence to decrease her risk of fall.     Rehab Potential Good   Clinical Impairments Affecting Rehab Potential Positive Factors: exercises regularly, motivated Negative Factors: chronic stroke, anxiety, depression Clinical Impression: Evolving-multiple falls, decrease strength, moderate perception of handicap on DHI   PT Frequency 2x / week   PT Duration 6 weeks   PT Treatment/Interventions ADLs/Self Care Home Management;Aquatic Therapy;Cryotherapy;Electrical Stimulation;Moist Heat;Gait training;Stair training;Functional mobility training;Therapeutic activities;Therapeutic exercise;Balance training;Neuromuscular re-education;Patient/family education;Manual techniques;Energy conservation   PT Next Visit Plan See 2x for 4 weeks, decrease to 1x 2 weeks, if safe to do so. Dynamic balance, strength, dual task ambulation, glut med on RLE   Consulted and Agree with Plan of Care Patient      Patient will benefit from skilled therapeutic intervention in order to improve the following deficits and impairments:  Abnormal gait, Decreased activity tolerance, Decreased balance, Decreased coordination, Decreased endurance, Decreased mobility, Decreased safety awareness, Decreased strength, Impaired sensation, Postural dysfunction, Improper body mechanics, Pain  Visit Diagnosis: Unsteadiness on feet  Muscle weakness (generalized)     Problem List Patient Active Problem List   Diagnosis Date Noted  . Frozen shoulder 12/26/2015  . Osteoporosis 12/26/2015  . Multiple fractures 12/26/2015  . ANXIETY  DEPRESSION 04/10/2010  . CEREBRAL EMBOLISM WITHOUT MENTION INFARCT 04/10/2010  . Atrial fibrillation ([place]) 03/11/2010  . FATIGUE / MALAISE 03/11/2010   Stacy Gardner, SPT  This entire session was performed under direct supervision and direction of a licensed therapist/therapist assistant . I have personally read, edited and approve of the note as written.  Trotter,Margaret PT, DPT 02/19/2016, 5:08 PM  [place] MAIN [facility] SERVICES [address] [place], [place], [address] Phone: [phone]   Fax:  [phone]  Name: Monica Whitaker MRN: [id number] Date of Birth: 1946/05/16

## 2016-02-20 ENCOUNTER — Encounter: Payer: Medicare Other | Admitting: Physical Therapy

## 2016-02-21 ENCOUNTER — Ambulatory Visit: Payer: Medicare Other | Admitting: Physical Therapy

## 2016-02-21 ENCOUNTER — Encounter: Payer: Medicare Other | Admitting: Physical Therapy

## 2016-02-21 ENCOUNTER — Encounter: Payer: Self-pay | Admitting: Physical Therapy

## 2016-02-21 DIAGNOSIS — R2681 Unsteadiness on feet: Secondary | ICD-10-CM | POA: Diagnosis not present

## 2016-02-21 DIAGNOSIS — M6281 Muscle weakness (generalized): Secondary | ICD-10-CM

## 2016-02-21 NOTE — Therapy (Addendum)
[place] [facility]IN [facility] SERVICES [address] [place], [place], [address] Phone: [phone]   Fax:  [phone]  Physical Therapy Treatment  Patient Details  Name: Monica Whitaker MRN: [id number] Date of Birth: 1946-02-26 Referring Provider: Lucille Passy  Encounter Date: 02/21/2016      PT End of Session - 02/21/16 0940    Visit Number 10   Number of Visits 13   Date for PT Re-Evaluation 02/26/16   Authorization Type G code 10   Authorization Time Period 10   PT Start Time 0800   PT Stop Time 0845   PT Time Calculation (min) 45 min   Equipment Utilized During Treatment Gait belt   Activity Tolerance Patient tolerated treatment well   Behavior During Therapy [facility] for tasks assessed/performed      Past Medical History  Diagnosis Date  . 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 Atrial fibrillation ([name])     paroxysmal, failed medical therapy with flecainide,  NSVT with tikosyn  . Thyroid disease   . Bruises easily   . Depression     medically controlled   . Anxiety     controlled with meds  . Low blood pressure     no falls, but if gets up to fast  . CVA (cerebral vascular accident) ([place]) 12/2007  . Stroke [facility]) 7 years ago    Lasting effects on balance, different personality.    Past Surgical History  Procedure Laterality Date  . Vaginal hysterectomy    . Tonsillectomy    . Tee without cardioversion N/A 10/18/2012    Procedure: TRANSESOPHAGEAL ECHOCARDIOGRAM (TEE);  Surgeon: Lelon Perla, MD;  Location: [facility] ENDOSCOPY;  Service: Cardiovascular;  Laterality: N/A;  Pre-Ablation at 12pm  . Atrial fibrillation ablation  10/18/12    PVI by Dr Rayann Heman  . Atrial fibrillation ablation N/A 10/18/2012    Procedure: ATRIAL FIBRILLATION ABLATION;  Surgeon: Thompson Grayer, MD;  Location: [facility] CATH LAB;  Service: Cardiovascular;  Laterality: N/A;  . Open reduction internal fixation (orif) distal radial fracture  Right 11/15/2014    Procedure: OPEN REDUCTION INTERNAL FIXATION (ORIF) RIGHT DISTAL RADIAL FRACTURE WITH ALLOGRAFT BONE GRAFT;  Surgeon: Roseanne Kaufman, MD;  Location: [place];  Service: Orthopedics;  Laterality: Right;  . Eye surgery      on L/R eye, macular hole     There were no vitals filed for this visit.      Subjective Assessment - 02/21/16 0758    Subjective Pt is feeling well today, she still reports that she is not as confident in her walking the past several days.     Pertinent History Pertinent Factors Relating to Rehab: depression, anxiety, caregiver to husband, chronic stroke   Limitations Standing;Walking   How long can you sit comfortably? N/A, unsteady when getting up   How long can you stand comfortably? 45 min., Careful for long periods of time, gets fatigued    How long can you walk comfortably? 1 mi., couple times a weeks   Diagnostic tests DEXA scheduled for 2 weeks, Shoulder: Mild acromioclavicular and glenohumeral degenerative change.   Patient Stated Goals "I don't want to be fearful when I'm moving"   Currently in Pain? No/denies      Treatment Resisted walking forward/backwards x 2 laps each with #12.5, close supervision for safety, min VCs to lean opposite way of walking  Lateral sidestepping with  red theraband resistance x 2 laps leading with BLEs, one LOB self corrected by pt, min VCs to increase step length  Ladder drills:  Abduction stepping in and out of ladder with emphasis on speed x 2 laps, CGA for stability   Forwards/backwards stepping in and out of ladder x 2 laps, CGA for stability, min VCs for increased step length   Sidestepping in and out of ladder x 2 laps, CGA for stability, one LOB self corrected Stair training:  PT demonstrated reciprocal gait pattern ascending/descending without UE support, pt  performed 3 times without UE support with CGA from therapist, 2 LOB that were self corrected  while pt descending stairs, min VCs  to increase foot clearance and upright posture  Tandem stance on half bolsters 1 set x 10 reps leading with BLEs, cross pattern with UEs while maintaining balance, CGA for stability, min VCs to perform slow to maintain stability  Squats with one leg positioned on bosu ball, 2 sets x 10 reps with BLEs, min VCs for upright posture  Dynamic alternating lunges x 10, 1 HHA, min VCs for powerful push off and initial positioning    BP at end of session: 115/63, pt reported feeling slightly woozy during session but BP was good.                             PT Education - 02/21/16 [phone]    Education provided Yes   Education Details proper lunges in her exercise class, eating before therapy    Person(s) Educated Patient   Methods Explanation;Demonstration;Verbal cues   Comprehension Verbalized understanding;Returned demonstration;Verbal cues required             PT Long Term Goals - 01/15/16 1201    PT LONG TERM GOAL #1   Title Patient will be independent in HEP in order to increase patient's ability to maintain gains achieved in therapy and assist with return to PLOF.    Time 6   Period Weeks   Status New   PT LONG TERM GOAL #2   Title Patient will decrease time take to perform 5 times sit to stand to 20 seconds without UE use in order to decrease her risk for falls.     Time 6   Period Weeks   Status New   PT LONG TERM GOAL #3   Title Patient will improve functional gait assessment by 5 pts. in order to attain MCID for stroke and to decrease fear of falling and improve ease of community ambulation.   Time 6   Period Weeks   Status New   PT LONG TERM GOAL #4   Title Patient will increase overall strength to 4+/5 in both UE and LE in order to complete transfers safely with decreased risk for falls.    Time 6   Period Weeks   Status New   PT LONG TERM GOAL #5   Title Patient will be able to negotiate one 4" curb with no UE support independently in a safe manner  in order to allow for greater community negotiation safely.    Time 6   Period Weeks   Status New               Plan - 02/21/16 0941    Clinical Impression Statement Pt continues to make progress with therapy and feeling more confident in her walking ability without falling.  She was able to perform ladder drills  today at a quicker speed without LOB.  Pt also progressed to squats with one leg supported on bosu ball.  Pt advanced hip strengthening with lateral band walking using red theraband without use of UEs.  She would continue to benefit from skilled physical therapy to continue progressing her dyanmic balance to increase her confidence and decrease fall risk.     Rehab Potential Good   Clinical Impairments Affecting Rehab Potential Positive Factors: exercises regularly, motivated Negative Factors: chronic stroke, anxiety, depression Clinical Impression: Evolving-multiple falls, decrease strength, moderate perception of handicap on DHI   PT Frequency 2x / week   PT Duration 6 weeks   PT Treatment/Interventions ADLs/Self Care Home Management;Aquatic Therapy;Cryotherapy;Electrical Stimulation;Moist Heat;Gait training;Stair training;Functional mobility training;Therapeutic activities;Therapeutic exercise;Balance training;Neuromuscular re-education;Patient/family education;Manual techniques;Energy conservation   PT Next Visit Plan dual tasking, bosu squats, ladder drills    Consulted and Agree with Plan of Care Patient      Patient will benefit from skilled therapeutic intervention in order to improve the following deficits and impairments:  Abnormal gait, Decreased activity tolerance, Decreased balance, Decreased coordination, Decreased endurance, Decreased mobility, Decreased safety awareness, Decreased strength, Impaired sensation, Postural dysfunction, Improper body mechanics, Pain  Visit Diagnosis: Unsteadiness on feet  Muscle weakness (generalized)       G-Codes - 03/16/2016  1034    Functional Assessment Tool Used clinical judgement, strength   Functional Limitation Mobility: Walking and moving around   Mobility: Walking and Moving Around Current Status [phone]) At least 20 percent but less than 40 percent impaired, limited or restricted   Mobility: Walking and Moving Around Goal Status [phone]) At least 1 percent but less than 20 percent impaired, limited or restricted      Problem List Patient Active Problem List   Diagnosis Date Noted  . Frozen shoulder 12/26/2015  . Osteoporosis 12/26/2015  . Multiple fractures 12/26/2015  . ANXIETY DEPRESSION 04/10/2010  . CEREBRAL EMBOLISM WITHOUT MENTION INFARCT 04/10/2010  . Atrial fibrillation ([place]) 03/11/2010  . FATIGUE / MALAISE 03/11/2010   Stacy Gardner, SPT  This entire session was performed under direct supervision and direction of a licensed therapist/therapist assistant . I have personally read, edited and approve of the note as written.  Trotter,Margaret PT, DPT 03-16-16, 10:35 AM  [place] MAIN [facility] SERVICES [address] [place], [place], [address] Phone: [phone]   Fax:  [phone]  Name: LOVELLE HAUSKNECHT MRN: [id number] Date of Birth: Jun 25, 1946

## 2016-02-24 ENCOUNTER — Encounter: Payer: Medicare Other | Admitting: Physical Therapy

## 2016-02-24 ENCOUNTER — Encounter: Payer: Self-pay | Admitting: Family Medicine

## 2016-02-25 ENCOUNTER — Encounter: Payer: Medicare Other | Admitting: Physical Therapy

## 2016-02-26 ENCOUNTER — Ambulatory Visit (INDEPENDENT_AMBULATORY_CARE_PROVIDER_SITE_OTHER): Payer: Medicare Other | Admitting: Family Medicine

## 2016-02-26 ENCOUNTER — Ambulatory Visit: Payer: Medicare Other | Admitting: Physical Therapy

## 2016-02-26 ENCOUNTER — Encounter: Payer: Self-pay | Admitting: Physical Therapy

## 2016-02-26 VITALS — BP 118/60 | HR 64 | Temp 98.1°F | Wt 171.0 lb

## 2016-02-26 DIAGNOSIS — R2681 Unsteadiness on feet: Secondary | ICD-10-CM | POA: Diagnosis not present

## 2016-02-26 DIAGNOSIS — M810 Age-related osteoporosis without current pathological fracture: Secondary | ICD-10-CM

## 2016-02-26 DIAGNOSIS — M6281 Muscle weakness (generalized): Secondary | ICD-10-CM | POA: Diagnosis not present

## 2016-02-26 NOTE — Patient Instructions (Addendum)
  Tandem Stance    Right foot in front of left, heel touching toe both feet "straight ahead". Stand on Foot Triangle of Support with both feet. Balance in this position __30_ seconds. Do with left foot in front of right.  Copyright  VHI. All rights reserved.

## 2016-02-26 NOTE — Assessment & Plan Note (Signed)
>  25 minutes spent in face to face time with patient, >50% spent in counselling or coordination of care Discussed osteoporosis tx options. Continue frequent weight bearing exercise. Has been intolerant to fosamax in past. Will apply for prolia. The patient indicates understanding of these issues and agrees with the plan.

## 2016-02-26 NOTE — Progress Notes (Signed)
Subjective:   Patient ID: Monica Whitaker, female    DOB: 03/11/1946, 70 y.o.   MRN: [id number]  Monica Whitaker is a pleasant 70 y.o. year old female who presents to clinic today with Follow-up (discuss bone density)  on 02/26/2016  HPI:  Osteoporosis- this month, left femur -2.5, other readings within osteopenia category. Here to discuss tx options.  Years ago took fosamax. It did cause some reflux.  She has had some bone fractures with recent falls.  She is walking several times per week and going through PT for gait instability. Current Outpatient Prescriptions on File Prior to Visit  Medication Sig Dispense Refill  . acetaminophen (TYLENOL) 500 MG tablet Take 2 tablets by mouth every 6 hours as needed for pain    . calcium carbonate (OS-CAL) 600 MG TABS Take 600 mg by mouth daily with breakfast.     . cholecalciferol (VITAMIN D) 1000 UNITS tablet Take 1,000 Units by mouth daily.      Marland Kitchen dextroamphetamine (DEXTROSTAT) 5 MG tablet Take 5 mg by mouth daily.     Marland Kitchen diltiazem (CARDIZEM CD) 180 MG 24 hr capsule Take 1 capsule (180 mg total) by mouth daily. 90 capsule 3  . DULoxetine (CYMBALTA) 30 MG capsule Take 1 capsule by mouth at bedtime. Reported on 01/15/2016    . DULoxetine (CYMBALTA) 60 MG capsule Take 60 mg by mouth every morning.     . flecainide (TAMBOCOR) 100 MG tablet TAKE 1 TABLET (100 MG TOTAL) BY MOUTH 2 (TWO) TIMES DAILY. 180 tablet 3  . levothyroxine (SYNTHROID, LEVOTHROID) 25 MCG tablet Take 1 tablet (25 mcg total) by mouth daily. OFFICE VISIT WITH LABS REQUIRED FOR ADDITIONAL REFILLS 30 tablet 0  . Multiple Vitamin (MULTIVITAMIN) tablet Take 1 tablet by mouth daily.      . Oral Electrolytes (BUFFERED SALT PO) Take 1 capsule by mouth daily as needed (Electrolyte/Salt supplement).     . Timolol Maleate (TIMOPTIC OP) Place 1 drop into the left eye daily.     . vitamin E 400 UNIT capsule Take 400 Units by mouth daily.      Alveda Reasons 20 MG TABS tablet TAKE 1 TABLET BY  MOUTH EVERY DAY 90 tablet 2   No current facility-administered medications on file prior to visit.     Allergies  Allergen Reactions  . Darvon Other (See Comments)    Severe panic attacks  . Propoxyphene N-Acetaminophen Other (See Comments)    Severe panic attacks  . Levofloxacin Anxiety and Other (See Comments)    Causes panic attacks.    Past Medical History:  Diagnosis Date  . Anxiety    controlled with meds  . Atrial fibrillation ([name])    paroxysmal, failed medical therapy with flecainide,  NSVT with tikosyn  . Bruises easily   . CVA (cerebral vascular accident) ([place]) 12/2007  . Depression    medically controlled   . Low blood pressure    no falls, but if gets up to fast  . Stroke ([place]) 7 years ago   Lasting effects on balance, different personality.  . Thyroid disease   . 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 ATRIAL FIBRILLATION ABLATION  10/18/12   PVI by Dr Rayann Heman  . ATRIAL FIBRILLATION ABLATION N/A 10/18/2012   Procedure: ATRIAL FIBRILLATION ABLATION;  Surgeon: Thompson Grayer, MD;  Location: [facility] CATH LAB;  Service:  Cardiovascular;  Laterality: N/A;  . EYE SURGERY     on L/R eye, macular hole   . OPEN REDUCTION INTERNAL FIXATION (ORIF) DISTAL RADIAL FRACTURE Right 11/15/2014   Procedure: OPEN REDUCTION INTERNAL FIXATION (ORIF) RIGHT DISTAL RADIAL FRACTURE WITH ALLOGRAFT BONE GRAFT;  Surgeon: Roseanne Kaufman, MD;  Location: [place];  Service: Orthopedics;  Laterality: Right;  . TEE WITHOUT CARDIOVERSION N/A 10/18/2012   Procedure: TRANSESOPHAGEAL ECHOCARDIOGRAM (TEE);  Surgeon: Lelon Perla, MD;  Location: [facility] ENDOSCOPY;  Service: Cardiovascular;  Laterality: N/A;  Pre-Ablation at 12pm  . TONSILLECTOMY    . VAGINAL HYSTERECTOMY      Family History  Problem Relation Age of Onset  . Multiple sclerosis Father     Social History   Social History  . Marital  status: Married    Spouse name: N/A  . Number of children: 0  . Years of education: N/A   Occupational History  . Retired Retired   Social History Main Topics  . Smoking status: Never Smoker  . Smokeless tobacco: Never Used  . Alcohol use 0.5 oz/week    1 Standard drinks or equivalent per week     Comment: regular  . Drug use: No  . Sexual activity: Not on file   Other Topics Concern  . Not on file   Social History Narrative   Lives in [place] with her husband.  No children.  Former Psychologist, prison and probation services      Has a living will-    Would desire CPR   Would not want prolonged life support if futile.   The PMH, PSH, Social History, Family History, Medications, and allergies have been reviewed in [facility], and have been updated if relevant.   Review of Systems  Constitutional: Negative.   Musculoskeletal: Negative.   Neurological: Negative.   Hematological: Negative.   All other systems reviewed and are negative.      Objective:    BP 118/60   Pulse 64   Temp 98.1 F (36.7 C) (Oral)   Wt 171 lb (77.6 kg)   SpO2 91%   BMI 25.25 kg/m    Physical Exam  Constitutional: She is oriented to person, place, and time. She appears well-developed and well-nourished. No distress.  HENT:  Head: Normocephalic.  Eyes: Conjunctivae are normal.  Cardiovascular: Normal rate.   Pulmonary/Chest: Effort normal.  Musculoskeletal: Normal range of motion.  Neurological: She is alert and oriented to person, place, and time. No cranial nerve deficit.  Skin: Skin is warm and dry. She is not diaphoretic.  Psychiatric: She has a normal mood and affect. Her behavior is normal. Judgment and thought content normal.  Nursing note and vitals reviewed.         Assessment & Plan:   Osteoporosis No Follow-up on file.

## 2016-02-26 NOTE — Therapy (Addendum)
[place] MAIN [facility] SERVICES [address] [place], [place], [address] Phone: [phone]   Fax:  [phone]  Physical Therapy Treatment  Patient Details  Name: Monica Whitaker MRN: [id number] Date of Birth: 1946-06-18 Referring Provider: Lucille Passy  Encounter Date: 02/26/2016      PT End of Session - 02/26/16 1404    Visit Number 11   Number of Visits 17   Date for PT Re-Evaluation 03/25/16   Authorization Type G code 11   Authorization Time Period 20   PT Start Time 1300   PT Stop Time 1345   PT Time Calculation (min) 45 min   Equipment Utilized During Treatment Gait belt   Activity Tolerance Patient tolerated treatment well   Behavior During Therapy [facility] for tasks assessed/performed      Past Medical History:  Diagnosis Date  . Anxiety    controlled with meds  . Atrial fibrillation ([name])    paroxysmal, failed medical therapy with flecainide,  NSVT with tikosyn  . Bruises easily   . CVA (cerebral vascular accident) ([place]) 12/2007  . Depression    medically controlled   . Low blood pressure    no falls, but if gets up to fast  . Stroke ([place]) 7 years ago   Lasting effects on balance, different personality.  . Thyroid disease   . 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 ATRIAL FIBRILLATION ABLATION  10/18/12   PVI by Dr Rayann Heman  . ATRIAL FIBRILLATION ABLATION N/A 10/18/2012   Procedure: ATRIAL FIBRILLATION ABLATION;  Surgeon: Thompson Grayer, MD;  Location: [facility] CATH LAB;  Service: Cardiovascular;  Laterality: N/A;  . EYE SURGERY     on L/R eye, macular hole   . OPEN REDUCTION INTERNAL FIXATION (ORIF) DISTAL RADIAL FRACTURE Right 11/15/2014   Procedure: OPEN REDUCTION INTERNAL FIXATION (ORIF) RIGHT DISTAL RADIAL FRACTURE WITH ALLOGRAFT BONE GRAFT;  Surgeon: Roseanne Kaufman, MD;  Location: [place];  Service: Orthopedics;   Laterality: Right;  . TEE WITHOUT CARDIOVERSION N/A 10/18/2012   Procedure: TRANSESOPHAGEAL ECHOCARDIOGRAM (TEE);  Surgeon: Lelon Perla, MD;  Location: [facility] ENDOSCOPY;  Service: Cardiovascular;  Laterality: N/A;  Pre-Ablation at 12pm  . TONSILLECTOMY    . VAGINAL HYSTERECTOMY      There were no vitals filed for this visit.      Subjective Assessment - 02/26/16 1308    Subjective Pt states she has gotten the reports that she has osteoporosis in some areas.  Patient states she is feeling well today but is worried about the results and some of the activites she has been doing in some of her exercise classess.    Pertinent History Pertinent Factors Relating to Rehab: depression, anxiety, caregiver to husband, chronic stroke   Limitations Standing;Walking   How long can you sit comfortably? N/A, unsteady when getting up   How long can you stand comfortably? 45 min., Careful for long periods of time, gets fatigued    How long can you walk comfortably? 1 mi., couple times a weeks   Diagnostic tests DEXA scheduled for 2 weeks, Shoulder: Mild acromioclavicular and glenohumeral degenerative change.   Patient Stated Goals "I don't want to be fearful when I'm moving"   Currently in Pain? No/denies            [facility] PT Assessment - 02/27/16 0001  Strength   Right Shoulder ABduction 4+/5   Left Shoulder ABduction 4+/5   Right Elbow Flexion 4+/5   Right Elbow Extension 4+/5   Left Elbow Flexion 4+/5   Left Elbow Extension 4+/5   Right Hip Flexion 4/5   Left Hip Flexion 4/5   Right Knee Flexion 4+/5   Right Knee Extension 4/5   Left Knee Flexion 4+/5   Left Knee Extension 4+/5   Right Ankle Dorsiflexion 4+/5   Left Ankle Dorsiflexion 4+/5     Standardized Balance Assessment   Five times sit to stand comments  15.0 seconds, on 5th one required min assist to stop from falling backwards, no UE use, improved from 01/15/16, whcih was 31.9 seconds,>14 seconds indicates increased risk for  falls   10 Meter Walk 1.11 m/s, no HHA, improved from 10.4 m/s on 01/15/16, community ambulator     Functional Gait  Assessment   Gait Level Surface Walks 20 ft in less than 5.5 sec, no assistive devices, good speed, no evidence for imbalance, normal gait pattern, deviates no more than 6 in outside of the 12 in walkway width.   Change in Gait Speed Able to change speed, demonstrates mild gait deviations, deviates 6-10 in outside of the 12 in walkway width, or no gait deviations, unable to achieve a major change in velocity, or uses a change in velocity, or uses an assistive device.   Gait with Horizontal Head Turns Performs head turns smoothly with no change in gait. Deviates no more than 6 in outside 12 in walkway width   Gait with Vertical Head Turns Performs head turns with no change in gait. Deviates no more than 6 in outside 12 in walkway width.   Gait and Pivot Turn Pivot turns safely within 3 sec and stops quickly with no loss of balance.   Step Over Obstacle Is able to step over one shoe box (4.5 in total height) without changing gait speed. No evidence of imbalance.   Gait with Narrow Base of Support Ambulates less than 4 steps heel to toe or cannot perform without assistance.   Gait with Eyes Closed Walks 20 ft, slow speed, abnormal gait pattern, evidence for imbalance, deviates 10-15 in outside 12 in walkway width. Requires more than 9 sec to ambulate 20 ft.   Ambulating Backwards Walks 20 ft, no assistive devices, good speed, no evidence for imbalance, normal gait   Steps Alternating feet, must use rail.   Total Score 22   FGA comment: Improved since 01/17/16 from 17/30, Just below geriatric cut off of >22/30      Treatment:  Patient's goals, outcomes measures, and strength were re-assessed.  Instructed patient one which exercises are good to perform at workout classes.  Instructed on how to perform FGA, required CGA throughout with min assist during eyes closed walking and walking  with narrow base of support.   Neuromuscular training:  Heel toe walking, CGA with 1 UE on wall, x20 steps Tandem stance, min assist, multiple shifts from midline, required touch downs to wall to maintain balance greater than 15 seconds. (given for HEP)                        PT Education - 02/26/16 1403    Education provided Yes   Education Details Progressed HEP, appropriate exercises (decreasing extreme lumbar flexion)   Person(s) Educated Patient   Methods Explanation;Verbal cues   Comprehension Verbal cues required;Verbalized understanding  PT Long Term Goals - 02/26/16 1407      PT LONG TERM GOAL #1   Title Patient will be independent in HEP in order to increase patient's ability to maintain gains achieved in therapy and assist with return to PLOF.    Time 4   Period Weeks   Status On-going     PT LONG TERM GOAL #2   Title Patient will decrease time take to perform 5 times sit to stand to 20 seconds without UE use in order to decrease her risk for falls.     Time 4   Period Weeks   Status Achieved     PT LONG TERM GOAL #3   Title Patient will improve functional gait assessment by 5 pts. in order to attain MCID for stroke and to decrease fear of falling and improve ease of community ambulation.   Time 4   Period Weeks   Status Achieved     PT LONG TERM GOAL #4   Title Patient will increase overall strength to 4+/5 in both UE and LE in order to complete transfers safely with decreased risk for falls.    Time 4   Period Weeks   Status Partially Met     PT LONG TERM GOAL #5   Title Patient will be able to negotiate one 4" curb with no UE support independently in a safe manner in order to allow for greater community negotiation safely.    Time 4   Period Weeks   Status Achieved     Additional Long Term Goals   Additional Long Term Goals Yes     PT LONG TERM GOAL #6   Title Patient will improve functional gait assessment >22/30 to  decrease fear of falling and improve ease of community ambulation.   Time 4   Period Weeks   Status New               Plan - 02/26/16 1404    Clinical Impression Statement Patient presents to physical therapy with mild increase in anxiety due to DEXA results showing osteoporosis. Patient has decreased fear of falling with improved confidence, but she continues to show fear of falling with difficult balance activities. Patient's goals, outcomes measures, and strength was re-assessed with improvements in all areas. Patient would continue to benefit from skilled PT in order to address higher level balance activities that are dynamic and safety with stairs.   Rehab Potential Good   Clinical Impairments Affecting Rehab Potential Positive Factors: exercises regularly, motivated Negative Factors: chronic stroke, anxiety, depression Clinical Impression: Evolving-multiple falls, decrease strength, moderate perception of handicap on DHI   PT Frequency 1x / week   PT Duration 4 weeks   PT Treatment/Interventions ADLs/Self Care Home Management;Aquatic Therapy;Cryotherapy;Electrical Stimulation;Moist Heat;Gait training;Stair training;Functional mobility training;Therapeutic activities;Therapeutic exercise;Balance training;Neuromuscular re-education;Patient/family education;Manual techniques;Energy conservation   PT Next Visit Plan heel to toe walking, static tandem/single leg stance, dynamic walking with dual task or head challenges, steps/curbs   PT Home Exercise Plan progressed HEP   Consulted and Agree with Plan of Care Patient      Patient will benefit from skilled therapeutic intervention in order to improve the following deficits and impairments:  Abnormal gait, Decreased activity tolerance, Decreased balance, Decreased coordination, Decreased endurance, Decreased mobility, Decreased safety awareness, Decreased strength, Impaired sensation, Postural dysfunction, Improper body mechanics,  Pain  Visit Diagnosis: Unsteadiness on feet - Plan: PT plan of care cert/re-cert  Muscle weakness (generalized) - Plan: PT plan of care  cert/re-cert     Problem List Patient Active Problem List   Diagnosis Date Noted  . Frozen shoulder 12/26/2015  . Osteoporosis 12/26/2015  . Multiple fractures 12/26/2015  . ANXIETY DEPRESSION 04/10/2010  . CEREBRAL EMBOLISM WITHOUT MENTION INFARCT 04/10/2010  . Atrial fibrillation ([place]) 03/11/2010  . FATIGUE / MALAISE 03/11/2010   Tilman Neat, SPT This entire session was performed under direct supervision and direction of a licensed therapist/therapist assistant . I have personally read, edited and approve of the note as written.  Trotter,Margaret PT, DPT 02/27/2016, 8:32 AM  [place] MAIN [facility] SERVICES [address] [place], [place], [address] Phone: [phone]   Fax:  [phone]  Name: Monica Whitaker MRN: [id number] Date of Birth: 06-24-1946

## 2016-02-27 ENCOUNTER — Encounter: Payer: Medicare Other | Admitting: Physical Therapy

## 2016-02-28 ENCOUNTER — Ambulatory Visit: Payer: Medicare Other | Admitting: Physical Therapy

## 2016-02-28 ENCOUNTER — Encounter: Payer: Self-pay | Admitting: Internal Medicine

## 2016-02-28 ENCOUNTER — Telehealth: Payer: Self-pay | Admitting: Family Medicine

## 2016-02-28 ENCOUNTER — Ambulatory Visit (INDEPENDENT_AMBULATORY_CARE_PROVIDER_SITE_OTHER): Payer: Medicare Other | Admitting: Internal Medicine

## 2016-02-28 ENCOUNTER — Encounter: Payer: Medicare Other | Admitting: Physical Therapy

## 2016-02-28 VITALS — BP 106/62 | HR 65 | Temp 99.3°F | Wt 171.0 lb

## 2016-02-28 DIAGNOSIS — B9789 Other viral agents as the cause of diseases classified elsewhere: Principal | ICD-10-CM

## 2016-02-28 DIAGNOSIS — J069 Acute upper respiratory infection, unspecified: Secondary | ICD-10-CM | POA: Diagnosis not present

## 2016-02-28 MED ORDER — HYDROCOD POLST-CPM POLST ER 10-8 MG/5ML PO SUER: 5.0000 mL | Freq: Every evening | ORAL | 0 refills | Status: DC | PRN

## 2016-02-28 NOTE — Patient Instructions (Signed)

## 2016-02-28 NOTE — Progress Notes (Signed)
Pre visit review using our clinic review tool, if applicable. No additional management support is needed unless otherwise documented below in the visit note. 

## 2016-02-28 NOTE — Progress Notes (Signed)
HPI  Pt presents to the clinic today with c/o facial pressure, runny nose, cough and chest congestion. This started yesterday. She is blowing clear mucous out of her nose. The cough is nonproductive, worse at night. She denies fever, chills or shortness of breath. She has taken Tylenol and Tussionex with some relief. She has no history of allergies or breathing problems. She has not had sick contacts.  Review of Systems      Past Medical History:  Diagnosis Date  . Anxiety    controlled with meds  . Atrial fibrillation ([name])    paroxysmal, failed medical therapy with flecainide,  NSVT with tikosyn  . Bruises easily   . CVA (cerebral vascular accident) ([place]) 12/2007  . Depression    medically controlled   . Low blood pressure    no falls, but if gets up to fast  . Stroke ([place]) 7 years ago   Lasting effects on balance, different personality.  . Thyroid disease   . 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 Multiple sclerosis Father     Social History   Social History  . Marital status: Married    Spouse name: N/A  . Number of children: 0  . Years of education: N/A   Occupational History  . Retired Retired   Social History Main Topics  . Smoking status: Never Smoker  . Smokeless tobacco: Never Used  . Alcohol use 0.5 oz/week    1 Standard drinks or equivalent per week     Comment: regular  . Drug use: No  . Sexual activity: Not on file   Other Topics Concern  . Not on file   Social History Narrative   Lives in [place] with her husband.  No children.  Former Psychologist, prison and probation services      Has a living will-    Would desire CPR   Would not want prolonged life support if futile.    Allergies  Allergen Reactions  . Darvon Other (See Comments)    Severe panic attacks  . Propoxyphene N-Acetaminophen Other (See Comments)    Severe panic attacks  . Levofloxacin Anxiety and Other (See  Comments)    Causes panic attacks.     Constitutional:Denies headache, fatigue, fever or abrupt weight changes.  HEENT:  Positive runny nose. Denies eye redness, eye pain, pressure behind the eyes, facial pain, nasal congestion, ear pain, ringing in the ears, wax buildup, or sore throat. Respiratory: Positive cough. Denies difficulty breathing or shortness of breath.  Cardiovascular: Denies chest pain, chest tightness, palpitations or swelling in the hands or feet.   No other specific complaints in a complete review of systems (except as listed in HPI above).  Objective:   BP 106/62 (BP Location: Right Arm, Patient Position: Sitting, Cuff Size: Normal)   Pulse 65   Temp 99.3 F (37.4 C) (Oral)   Wt 171 lb (77.6 kg)   SpO2 97%   BMI 25.25 kg/m   Wt Readings from Last 3 Encounters:  02/28/16 171 lb (77.6 kg)  02/26/16 171 lb (77.6 kg)  12/26/15 170 lb 4 oz (77.2 kg)     General: Appears her stated age, well developed, well nourished in NAD. HEENT: Head: normal shape and size, no sinus tenderness noted; Eyes: sclera white, no icterus, conjunctiva pink; Ears: Tm's gray and intact, normal light reflex; Nose: mucosa pink and moist,  septum midline; Throat/Mouth: + PND. Teeth present, mucosa pink and moist, no exudate noted, no lesions or ulcerations noted.  Neck: No cervical lymphadenopathy.  Cardiovascular: Normal rate and rhythm. S1,S2 noted.  No murmur, rubs or gallops noted.  Pulmonary/Chest: Normal effort and positive vesicular breath sounds. No respiratory distress. No wheezes, rales or ronchi noted.      Assessment & Plan:   Viral Upper Respiratory Infection with Cough:  Get some rest and drink plenty of water Can take Mucinex 600 mg every 12 hours x 3 days RX for Tussionex cough syrup refilled today  RTC as needed or if symptoms persist.   Webb Silversmith, NP

## 2016-02-28 NOTE — Telephone Encounter (Signed)
Left v/m for pt requesting cb.

## 2016-02-28 NOTE — Telephone Encounter (Signed)
[facility]  Patient Name: Monica Whitaker  DOB: 05/01/1946    Initial Comment low grade fever 98, chest congestion, bad headache.    Nurse Assessment      Guidelines    Guideline Title Affirmed Question Affirmed Notes       Final Disposition User        Comments  CALLED PATIENT 3 TIMES WITH NO SUCCESS IN REACHING HER. Left messages for her to contact the office and if she has secured appt on own please keep appt.

## 2016-03-02 ENCOUNTER — Encounter: Payer: Medicare Other | Admitting: Physical Therapy

## 2016-03-03 ENCOUNTER — Telehealth: Payer: Self-pay | Admitting: Family Medicine

## 2016-03-03 ENCOUNTER — Encounter: Payer: Self-pay | Admitting: Physical Therapy

## 2016-03-03 ENCOUNTER — Ambulatory Visit: Payer: Medicare Other | Attending: Family Medicine | Admitting: Physical Therapy

## 2016-03-03 DIAGNOSIS — M6281 Muscle weakness (generalized): Secondary | ICD-10-CM | POA: Diagnosis not present

## 2016-03-03 DIAGNOSIS — R2681 Unsteadiness on feet: Secondary | ICD-10-CM | POA: Diagnosis not present

## 2016-03-03 NOTE — Therapy (Signed)
[facility] MAIN [facility] SERVICES [address] [place], [place], [address] Phone: [phone]   Fax:  [phone]  Physical Therapy Treatment  Patient Details  Name: Monica Whitaker MRN: [id number] Date of Birth: 1945-11-19 Referring Provider: Dianne Dun  Encounter Date: 03/03/2016      PT End of Session - 03/03/16 1023    Visit Number 12   Number of Visits 17   Date for PT Re-Evaluation 03/25/16   Authorization Type G code 12   Authorization Time Period 20   PT Start Time 0805   PT Stop Time 0845   PT Time Calculation (min) 40 min   Equipment Utilized During Treatment Gait belt   Activity Tolerance Patient tolerated treatment well;Patient limited by fatigue   Behavior During Therapy [facility] for tasks assessed/performed      Past Medical History:  Diagnosis Date  . Anxiety    controlled with meds  . Atrial fibrillation ([name])    paroxysmal, failed medical therapy with flecainide,  NSVT with tikosyn  . Bruises easily   . CVA (cerebral vascular accident) ([name]) 12/2007  . Depression    medically controlled   . Low blood pressure    no falls, but if gets up to fast  . Stroke ([name]) 7 years ago   Lasting effects on balance, different personality.  . Thyroid disease   . 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 ATRIAL FIBRILLATION ABLATION  10/18/12   PVI by Dr Johney Frame  . ATRIAL FIBRILLATION ABLATION N/A 10/18/2012   Procedure: ATRIAL FIBRILLATION ABLATION;  Surgeon: Hillis Range, MD;  Location: [facility] CATH LAB;  Service: Cardiovascular;  Laterality: N/A;  . EYE SURGERY     on L/R eye, macular hole   . OPEN REDUCTION INTERNAL FIXATION (ORIF) DISTAL RADIAL FRACTURE Right 11/15/2014   Procedure: OPEN REDUCTION INTERNAL FIXATION (ORIF) RIGHT DISTAL RADIAL FRACTURE WITH ALLOGRAFT BONE GRAFT;  Surgeon: Dominica Severin, MD;  Location: [facility];   Service: Orthopedics;  Laterality: Right;  . TEE WITHOUT CARDIOVERSION N/A 10/18/2012   Procedure: TRANSESOPHAGEAL ECHOCARDIOGRAM (TEE);  Surgeon: Lewayne Bunting, MD;  Location: [facility] ENDOSCOPY;  Service: Cardiovascular;  Laterality: N/A;  Pre-Ablation at 12pm  . TONSILLECTOMY    . VAGINAL HYSTERECTOMY      There were no vitals filed for this visit.      Subjective Assessment - 03/03/16 0807    Subjective Patient reports to PT with a cold that started last Thursday and that she reports has made her not able to do any exercises recently. She reports this is the reason she canceled her last appointment. She has not performed any of her HEP due to the sickness   Pertinent History Pertinent Factors Relating to Rehab: depression, anxiety, caregiver to husband, chronic stroke   Limitations Standing;Walking   How long can you sit comfortably? N/A, unsteady when getting up   How long can you stand comfortably? 45 min., Careful for long periods of time, gets fatigued    How long can you walk comfortably? 1 mi., couple times a weeks   Diagnostic tests DEXA scheduled for 2 weeks, Shoulder: Mild acromioclavicular and glenohumeral degenerative change.   Patient Stated Goals "I don't want to be fearful when I'm moving"   Currently in Pain? No/denies     Treatment:  NuStep L3, LE only, x2 minutes, taking history  throughout  Heel to toe walking on firm surface x3 in // bars, improved from last session, min assist for stability, slight UE touch downs to restabilize   Tandem stance on half bolster x15 seconds each LE leading, increased to UE flexion with 1.5# bar weight x5 reps each LE leading, slight UE touch downs to re-stabilize. Min assist to maintain stability.  Instructed in lifting techniques of golfers squat, deep squat to lift, and proper body mechanics for cleaning. Handout given for home. 4.4# weight in box to simulate true lifting x2 reps. Min VCs to increase feet distance in order to  increase stability   Lateral step downs, 2 HHA on 4-6" step, 2 x10 each LE, min VCs to increase eccentric and concentric control of LE performing the activity.  Dual task walking (CGA/min assist to maintain stability) Oblique ball twists during ambulation, hallway x 2; slower gait speed Fast/slow walking with SPT verbal instruction on when to initiated, hallway x2 Vertical (upwards) head movements during ambulation, hallway x2, 2 shifts from midline with min assist to regain balance; decreased gait speed with reported increased difficulty.                               PT Education - 03/03/16 1022    Education provided Yes   Education Details lifting techniques, safe exercise   Person(s) Educated Patient   Methods Explanation;Verbal cues;Demonstration   Comprehension Verbalized understanding;Returned demonstration;Verbal cues required             PT Long Term Goals - 02/26/16 1407      PT LONG TERM GOAL #1   Title Patient will be independent in HEP in order to increase patient's ability to maintain gains achieved in therapy and assist with return to PLOF.    Time 4   Period Weeks   Status On-going     PT LONG TERM GOAL #2   Title Patient will decrease time take to perform 5 times sit to stand to 20 seconds without UE use in order to decrease her risk for falls.     Time 4   Period Weeks   Status Achieved     PT LONG TERM GOAL #3   Title Patient will improve functional gait assessment by 5 pts. in order to attain MCID for stroke and to decrease fear of falling and improve ease of community ambulation.   Time 4   Period Weeks   Status Achieved     PT LONG TERM GOAL #4   Title Patient will increase overall strength to 4+/5 in both UE and LE in order to complete transfers safely with decreased risk for falls.    Time 4   Period Weeks   Status Partially Met     PT LONG TERM GOAL #5   Title Patient will be able to negotiate one 4" curb with no UE  support independently in a safe manner in order to allow for greater community negotiation safely.    Time 4   Period Weeks   Status Achieved     Additional Long Term Goals   Additional Long Term Goals Yes     PT LONG TERM GOAL #6   Title Patient will improve functional gait assessment >22/30 to decrease fear of falling and improve ease of community ambulation.   Time 4   Period Weeks   Status New  Plan - 03/03/16 1023    Clinical Impression Statement Patient presents to PT with cold that she says has improved significantly since the weekend. She is able to participate in exercises with an increase in rest breaks and slightly lighter activities. Patient states that her condition does not worsen throughout the treatment with no change in status. Patient would continue to benefit from skilled PT in order to address high level balance and safety with mobility.    Rehab Potential Good   Clinical Impairments Affecting Rehab Potential Positive Factors: exercises regularly, motivated Negative Factors: chronic stroke, anxiety, depression Clinical Impression: Evolving-multiple falls, decrease strength, moderate perception of handicap on DHI   PT Frequency 1x / week   PT Duration 4 weeks   PT Treatment/Interventions ADLs/Self Care Home Management;Aquatic Therapy;Cryotherapy;Electrical Stimulation;Moist Heat;Gait training;Stair training;Functional mobility training;Therapeutic activities;Therapeutic exercise;Balance training;Neuromuscular re-education;Patient/family education;Manual techniques;Energy conservation   PT Next Visit Plan heel to toe walking, static tandem/single leg stance, dynamic walking with dual task or head challenges, steps/curbs   PT Home Exercise Plan continue HEP as given    Consulted and Agree with Plan of Care Patient      Patient will benefit from skilled therapeutic intervention in order to improve the following deficits and impairments:  Abnormal gait,  Decreased activity tolerance, Decreased balance, Decreased coordination, Decreased endurance, Decreased mobility, Decreased safety awareness, Decreased strength, Impaired sensation, Postural dysfunction, Improper body mechanics, Pain  Visit Diagnosis: Unsteadiness on feet  Muscle weakness (generalized)     Problem List Patient Active Problem List   Diagnosis Date Noted  . Frozen shoulder 12/26/2015  . Osteoporosis 12/26/2015  . Multiple fractures 12/26/2015  . ANXIETY DEPRESSION 04/10/2010  . CEREBRAL EMBOLISM WITHOUT MENTION INFARCT 04/10/2010  . Atrial fibrillation ([place]) 03/11/2010  . FATIGUE / MALAISE 03/11/2010   Tilman Neat, SPT This entire session was performed under direct supervision and direction of a licensed therapist/therapist assistant . I have personally read, edited and approve of the note as written.  Trotter,Margaret PT, DPT 03/03/2016, 11:24 AM  [place] MAIN [facility] SERVICES [address] [place], [place], [address] Phone: [phone]   Fax:  [phone]  Name: CHRISANNE LOOSE MRN: [id number] Date of Birth: 1946-01-17

## 2016-03-03 NOTE — Telephone Encounter (Signed)
I have electronically submitted pt's info for [employer] insurance verification and will notify you once I have a response. Thank you. °

## 2016-03-04 ENCOUNTER — Ambulatory Visit (INDEPENDENT_AMBULATORY_CARE_PROVIDER_SITE_OTHER): Payer: Medicare Other | Admitting: Family Medicine

## 2016-03-04 ENCOUNTER — Encounter: Payer: Self-pay | Admitting: Family Medicine

## 2016-03-04 DIAGNOSIS — R05 Cough: Secondary | ICD-10-CM | POA: Diagnosis not present

## 2016-03-04 DIAGNOSIS — R059 Cough, unspecified: Secondary | ICD-10-CM

## 2016-03-04 MED ORDER — DOXYCYCLINE HYCLATE 100 MG PO TABS: 100.0000 mg | ORAL_TABLET | Freq: Two times a day (BID) | ORAL | 0 refills | Status: DC

## 2016-03-04 NOTE — Telephone Encounter (Signed)
Pt has already been seen. Close note.

## 2016-03-04 NOTE — Progress Notes (Signed)
Pre visit review using our clinic review tool, if applicable. No additional management support is needed unless otherwise documented below in the visit note. 

## 2016-03-04 NOTE — Patient Instructions (Signed)
Use the cough medicine as needed and start doxycycline.  Rest and fluids.  Update [place] as needed.  Take care.  Glad to see you.

## 2016-03-05 NOTE — Progress Notes (Signed)
Cough started about 1 week ago.  Still coughing, yellow sputum.  Tried cough syrup, cough drops.  Waking up in the middle of the night from cough.  No fevers.  Now wheeze.  Chest is sore from coughing.  Fatigued.  No vomiting, no diarrhea.  No rash.  No ST.  Some rhinorrhea, episodically.  No ear pain.  No sick contacts.  Nonsmoker.    On flecanide.    Meds, vitals, and allergies reviewed.   ROS: Per HPI unless specifically indicated in ROS section   GEN: nad, alert and oriented HEENT: mucous membranes moist, tm w/o erythema, nasal exam w/o erythema, clear discharge noted,  OP with cobblestoning noted.  NECK: supple w/o LA CV: rrr.   PULM: ctab except for B scattered rhonchi, no wheeze on exam, no inc wob EXT: no edema Cough noted during exam.

## 2016-03-05 NOTE — Assessment & Plan Note (Signed)
Presumed bronchitis.   Use hydrocodone cough syrup as needed and start doxycycline.  Routine cautions d/w pt.  Rest and fluids.   Update [place] as needed.  She agrees.

## 2016-03-06 ENCOUNTER — Ambulatory Visit: Payer: Medicare Other | Admitting: Internal Medicine

## 2016-03-06 ENCOUNTER — Encounter: Payer: Medicare Other | Admitting: Physical Therapy

## 2016-03-08 ENCOUNTER — Other Ambulatory Visit: Payer: Self-pay | Admitting: Family Medicine

## 2016-03-10 ENCOUNTER — Ambulatory Visit: Payer: Medicare Other | Admitting: Physical Therapy

## 2016-03-10 ENCOUNTER — Encounter: Payer: Self-pay | Admitting: Physical Therapy

## 2016-03-10 DIAGNOSIS — M6281 Muscle weakness (generalized): Secondary | ICD-10-CM

## 2016-03-10 DIAGNOSIS — R2681 Unsteadiness on feet: Secondary | ICD-10-CM

## 2016-03-10 NOTE — Therapy (Signed)
[place] MAIN [facility] SERVICES [address] [place], [place], [address] Phone: [phone]   Fax:  [phone]  Physical Therapy Treatment  Patient Details  Name: Monica Whitaker MRN: [id number] Date of Birth: 04-29-1946 Referring Provider: Lucille Passy  Encounter Date: 03/10/2016      PT End of Session - 03/10/16 1431    Visit Number 13   Number of Visits 17   Date for PT Re-Evaluation 03/25/16   Authorization Type G code 3   Authorization Time Period 10   PT Start Time 1330   PT Stop Time 1415   PT Time Calculation (min) 45 min   Equipment Utilized During Treatment Gait belt   Activity Tolerance Patient tolerated treatment well;Patient limited by fatigue   Behavior During Therapy [facility] for tasks assessed/performed      Past Medical History:  Diagnosis Date  . Anxiety    controlled with meds  . Atrial fibrillation ([name])    paroxysmal, failed medical therapy with flecainide,  NSVT with tikosyn  . Bruises easily   . CVA (cerebral vascular accident) ([place]) 12/2007  . Depression    medically controlled   . Low blood pressure    no falls, but if gets up to fast  . Stroke ([place]) 7 years ago   Lasting effects on balance, different personality.  . Thyroid disease   . 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 ATRIAL FIBRILLATION ABLATION  10/18/12   PVI by Dr Rayann Heman  . ATRIAL FIBRILLATION ABLATION N/A 10/18/2012   Procedure: ATRIAL FIBRILLATION ABLATION;  Surgeon: Thompson Grayer, MD;  Location: [facility] CATH LAB;  Service: Cardiovascular;  Laterality: N/A;  . EYE SURGERY     on L/R eye, macular hole   . OPEN REDUCTION INTERNAL FIXATION (ORIF) DISTAL RADIAL FRACTURE Right 11/15/2014   Procedure: OPEN REDUCTION INTERNAL FIXATION (ORIF) RIGHT DISTAL RADIAL FRACTURE WITH ALLOGRAFT BONE GRAFT;  Surgeon: Roseanne Kaufman, MD;  Location: [facility];   Service: Orthopedics;  Laterality: Right;  . TEE WITHOUT CARDIOVERSION N/A 10/18/2012   Procedure: TRANSESOPHAGEAL ECHOCARDIOGRAM (TEE);  Surgeon: Lelon Perla, MD;  Location: [facility] ENDOSCOPY;  Service: Cardiovascular;  Laterality: N/A;  Pre-Ablation at 12pm  . TONSILLECTOMY    . VAGINAL HYSTERECTOMY      There were no vitals filed for this visit.      Subjective Assessment - 03/10/16 1332    Subjective Pt reports that today is the first day back to her exercise class after feeling sick the past week.  She is feeling better this week than last.     Pertinent History Pertinent Factors Relating to Rehab: depression, anxiety, caregiver to husband, chronic stroke   Limitations Standing;Walking   How long can you sit comfortably? N/A, unsteady when getting up   How long can you stand comfortably? 45 min., Careful for long periods of time, gets fatigued    How long can you walk comfortably? 1 mi., couple times a weeks   Diagnostic tests DEXA scheduled for 2 weeks, Shoulder: Mild acromioclavicular and glenohumeral degenerative change.   Patient Stated Goals "I don't want to be fearful when I'm moving"   Currently in Pain? No/denies       Treatment Nustep x 4 mins, BLEs, level 3 (unbilled)  Side squats with one leg on bosu ball, 2 sets x 10 reps leading with each  LE, min VCs to prevent knee valgus  Step ups on bosu ball, 2 sets x 10 reps, 2 finger tip assist with BUEs, CGA from therapist, min VCs for upright posture  Heel toe walking x 6 laps, 1 HHA for stabilty, min VCs to keep eye gaze forward  Sidestepping on balance beam with self ball toss at wall x 3 laps, several LOB self corrected by patient, CGA for safety  Sideshuffling with red theraband resistance and cognitive task to reach high or low for playing card called out x 20 sideshuffles, min VCs to increase speed to challenge balance Hip flexion marches seated on therapy ball, 2 sets x 10 marches, min VCs to increase ROM and decrease  speed, CGA for stability LAQs seated on therapy ball, 2 sets x 10 reps, min VCs for eccentric control, CGA for stability  Single arm raise/opposite leg raise on therapy ball, 2 sets x 10 reps, min VCs for increased shoulder flexion, CGA for stability                             PT Education - 03/10/16 1430    Education provided Yes   Education Details rest and elevating feet for swelling    Person(s) Educated Patient   Methods Explanation;Verbal cues;Demonstration   Comprehension Verbalized understanding;Returned demonstration;Verbal cues required             PT Long Term Goals - 02/26/16 1407      PT LONG TERM GOAL #1   Title Patient will be independent in HEP in order to increase patient's ability to maintain gains achieved in therapy and assist with return to PLOF.    Time 4   Period Weeks   Status On-going     PT LONG TERM GOAL #2   Title Patient will decrease time take to perform 5 times sit to stand to 20 seconds without UE use in order to decrease her risk for falls.     Time 4   Period Weeks   Status Achieved     PT LONG TERM GOAL #3   Title Patient will improve functional gait assessment by 5 pts. in order to attain MCID for stroke and to decrease fear of falling and improve ease of community ambulation.   Time 4   Period Weeks   Status Achieved     PT LONG TERM GOAL #4   Title Patient will increase overall strength to 4+/5 in both UE and LE in order to complete transfers safely with decreased risk for falls.    Time 4   Period Weeks   Status Partially Met     PT LONG TERM GOAL #5   Title Patient will be able to negotiate one 4" curb with no UE support independently in a safe manner in order to allow for greater community negotiation safely.    Time 4   Period Weeks   Status Achieved     Additional Long Term Goals   Additional Long Term Goals Yes     PT LONG TERM GOAL #6   Title Patient will improve functional gait assessment  >22/30 to decrease fear of falling and improve ease of community ambulation.   Time 4   Period Weeks   Status New               Plan - 03/10/16 1431    Clinical Impression Statement Pt reports that she went back to her  exercise class today for the first time in a week after being sick.  She was able to progress LE strength with squats with one leg supported on the bosu ball with good technique using 1 HHA.  Initiated core stabilization with balance while performing activites seated on the therapy ball, pt required 2 finger assist for stability and CGA from therapist for safety.  Pt was challenged today with sidestepping using red theraband resistance while dual tasking with a cognitive task.  Cognitive task slowed patients pace and required min VCs for increased speed.  She would benefit from further skilled phyiscal therapy to continue addressing her dynamic balance and LE strength to decrease falls risk and confidence with functional mobility.     Rehab Potential Good   Clinical Impairments Affecting Rehab Potential Positive Factors: exercises regularly, motivated Negative Factors: chronic stroke, anxiety, depression Clinical Impression: Evolving-multiple falls, decrease strength, moderate perception of handicap on DHI   PT Frequency 1x / week   PT Duration 4 weeks   PT Treatment/Interventions ADLs/Self Care Home Management;Aquatic Therapy;Cryotherapy;Electrical Stimulation;Moist Heat;Gait training;Stair training;Functional mobility training;Therapeutic activities;Therapeutic exercise;Balance training;Neuromuscular re-education;Patient/family education;Manual techniques;Energy conservation   PT Next Visit Plan heel to toe walking, static tandem/single leg stance, dynamic walking with dual task or head challenges, steps/curbs   PT Home Exercise Plan continue HEP as given    Consulted and Agree with Plan of Care Patient      Patient will benefit from skilled therapeutic intervention in  order to improve the following deficits and impairments:  Abnormal gait, Decreased activity tolerance, Decreased balance, Decreased coordination, Decreased endurance, Decreased mobility, Decreased safety awareness, Decreased strength, Impaired sensation, Postural dysfunction, Improper body mechanics, Pain  Visit Diagnosis: Unsteadiness on feet  Muscle weakness (generalized)     Problem List Patient Active Problem List   Diagnosis Date Noted  . Frozen shoulder 12/26/2015  . Osteoporosis 12/26/2015  . Multiple fractures 12/26/2015  . Cough 04/09/2011  . ANXIETY DEPRESSION 04/10/2010  . CEREBRAL EMBOLISM WITHOUT MENTION INFARCT 04/10/2010  . Atrial fibrillation ([place]) 03/11/2010  . FATIGUE / MALAISE 03/11/2010   Stacy Gardner, SPT  This entire session was performed under direct supervision and direction of a licensed therapist/therapist assistant . I have personally read, edited and approve of the note as written.  Trotter,Margaret  PT, DPT 03/11/2016, 8:45 AM  [place] MAIN [facility] SERVICES [address] [place], [place], [address] Phone: [phone]   Fax:  [phone]  Name: ANIELLE HEADRICK MRN: [id number] Date of Birth: [birth year]

## 2016-03-16 ENCOUNTER — Ambulatory Visit: Payer: Medicare Other | Admitting: Physical Therapy

## 2016-03-16 ENCOUNTER — Encounter: Payer: Self-pay | Admitting: Physical Therapy

## 2016-03-16 DIAGNOSIS — M6281 Muscle weakness (generalized): Secondary | ICD-10-CM | POA: Diagnosis not present

## 2016-03-16 DIAGNOSIS — R2681 Unsteadiness on feet: Secondary | ICD-10-CM | POA: Diagnosis not present

## 2016-03-16 NOTE — Therapy (Signed)
[place] MAIN [facility] SERVICES [address] [place], [place], [address] Phone: [phone]   Fax:  [phone]  Physical Therapy Treatment  Patient Details  Name: Monica Whitaker MRN: [id number] Date of Birth: 05/08/1946 Referring Provider: Lucille Passy  Encounter Date: 03/16/2016      PT End of Session - 03/16/16 1731    Visit Number 14   Number of Visits 17   Date for PT Re-Evaluation 03/25/16   Authorization Type G code 4   Authorization Time Period 10   PT Start Time 1647   PT Stop Time 1728   PT Time Calculation (min) 41 min   Equipment Utilized During Treatment Gait belt   Activity Tolerance Patient tolerated treatment well   Behavior During Therapy [facility] for tasks assessed/performed      Past Medical History:  Diagnosis Date  . Anxiety    controlled with meds  . Atrial fibrillation ([name])    paroxysmal, failed medical therapy with flecainide,  NSVT with tikosyn  . Bruises easily   . CVA (cerebral vascular accident) ([place]) 12/2007  . Depression    medically controlled   . Low blood pressure    no falls, but if gets up to fast  . Stroke ([place]) 7 years ago   Lasting effects on balance, different personality.  . Thyroid disease   . 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 ATRIAL FIBRILLATION ABLATION  10/18/12   PVI by Dr Rayann Heman  . ATRIAL FIBRILLATION ABLATION N/A 10/18/2012   Procedure: ATRIAL FIBRILLATION ABLATION;  Surgeon: Thompson Grayer, MD;  Location: [facility] CATH LAB;  Service: Cardiovascular;  Laterality: N/A;  . EYE SURGERY     on L/R eye, macular hole   . OPEN REDUCTION INTERNAL FIXATION (ORIF) DISTAL RADIAL FRACTURE Right 11/15/2014   Procedure: OPEN REDUCTION INTERNAL FIXATION (ORIF) RIGHT DISTAL RADIAL FRACTURE WITH ALLOGRAFT BONE GRAFT;  Surgeon: Roseanne Kaufman, MD;  Location: [place];  Service: Orthopedics;   Laterality: Right;  . TEE WITHOUT CARDIOVERSION N/A 10/18/2012   Procedure: TRANSESOPHAGEAL ECHOCARDIOGRAM (TEE);  Surgeon: Lelon Perla, MD;  Location: [facility] ENDOSCOPY;  Service: Cardiovascular;  Laterality: N/A;  Pre-Ablation at 12pm  . TONSILLECTOMY    . VAGINAL HYSTERECTOMY      There were no vitals filed for this visit.      Subjective Assessment - 03/16/16 1649    Subjective Patient reports she began the exercise recumbent bike. Patient states she is feeling better and did her first pool class today.  She reports she had no falls.    Currently in Pain? No/denies       Treatment:  NuStep, L2, x4 minutes, LE only (2 minutes unbilled, 2 minutes of history taking)  Static wide tandem standing with BLE on dynadisc, each LE leading, light HHA, x30 seconds each, instructed to perform vertical head turns to challenge balance, minA for balance Static standing on tilt board, instructed to maintain in center and perform with horizontal head turns, CGA for stability, min VCs to maintain in the middle.   Airex balance beam, x2 each direction (lateral and heel to toe), minA for stability, instructed to perform with left/right cervical rotations during lateral walking to increase challenge.  Static stance on half bolster (flat side up), x20 seconds each activity including tandem stance each LE leading, feet apart, and feet apart toe taps  forward/backwards, no HHA, minA for stability  Obstacle course with step up, walking around cones, step over bolsters, and picking up cone, patient showed good confidence during activity.  Increased on 2nd set to perform while holding a 4.4# ball weight to simulate something like grocery shopping, CGA during activity.  Dynamic walking with horizontal (x2 passes in hallway) and vertical head turns (x1), CGA for safety, minA during horizontal head turns with slowing of gait due to mild imbalances.   Single leg stance, each LE, RLE at ~4 seconds, LLE ~15  seconds, CGA for stability  Cone taps while standing on firm surface, 3x3, standing on RLE only, CGA.  Quick reactive stepping in 4 directions with VCs from SPT on where to step, 3x10, instructed to emphasize on increasing step length and quickness in order to simulate reactive stepping to regain balance.                             PT Education - 03/16/16 1731    Education provided Yes   Education Details modifying side stepping exercise, POC   Person(s) Educated Patient   Methods Explanation;Demonstration   Comprehension Verbalized understanding;Returned demonstration             PT Long Term Goals - 02/26/16 1407      PT LONG TERM GOAL #1   Title Patient will be independent in HEP in order to increase patient's ability to maintain gains achieved in therapy and assist with return to PLOF.    Time 4   Period Weeks   Status On-going     PT LONG TERM GOAL #2   Title Patient will decrease time take to perform 5 times sit to stand to 20 seconds without UE use in order to decrease her risk for falls.     Time 4   Period Weeks   Status Achieved     PT LONG TERM GOAL #3   Title Patient will improve functional gait assessment by 5 pts. in order to attain MCID for stroke and to decrease fear of falling and improve ease of community ambulation.   Time 4   Period Weeks   Status Achieved     PT LONG TERM GOAL #4   Title Patient will increase overall strength to 4+/5 in both UE and LE in order to complete transfers safely with decreased risk for falls.    Time 4   Period Weeks   Status Partially Met     PT LONG TERM GOAL #5   Title Patient will be able to negotiate one 4" curb with no UE support independently in a safe manner in order to allow for greater community negotiation safely.    Time 4   Period Weeks   Status Achieved     Additional Long Term Goals   Additional Long Term Goals Yes     PT LONG TERM GOAL #6   Title Patient will improve  functional gait assessment >22/30 to decrease fear of falling and improve ease of community ambulation.   Time 4   Period Weeks   Status New               Plan - 03/16/16 1732    Clinical Impression Statement Patient states she is feeling better and was able to go back to her water exercises. Her balance has improved since initial examination, and she also has decreased fear of falling when performing activities. Patient  requires CGA-minA during balance activities for safety. Patient would benefit from further skilled PT in order to address high level balance activities and improve confidence with functional mobility.    Rehab Potential Good   Clinical Impairments Affecting Rehab Potential Positive Factors: exercises regularly, motivated Negative Factors: chronic stroke, anxiety, depression Clinical Impression: Evolving-multiple falls, decrease strength, moderate perception of handicap on DHI   PT Frequency 1x / week   PT Duration 4 weeks   PT Treatment/Interventions ADLs/Self Care Home Management;Aquatic Therapy;Cryotherapy;Electrical Stimulation;Moist Heat;Gait training;Stair training;Functional mobility training;Therapeutic activities;Therapeutic exercise;Balance training;Neuromuscular re-education;Patient/family education;Manual techniques;Energy conservation   PT Next Visit Plan heel to toe walking, static tandem/single leg stance, dynamic walking with dual task or head challenges, steps/curbs   PT Home Exercise Plan continue HEP as given    Consulted and Agree with Plan of Care Patient      Patient will benefit from skilled therapeutic intervention in order to improve the following deficits and impairments:  Abnormal gait, Decreased activity tolerance, Decreased balance, Decreased coordination, Decreased endurance, Decreased mobility, Decreased safety awareness, Decreased strength, Impaired sensation, Postural dysfunction, Improper body mechanics, Pain  Visit  Diagnosis: Unsteadiness on feet  Muscle weakness (generalized)     Problem List Patient Active Problem List   Diagnosis Date Noted  . Frozen shoulder 12/26/2015  . Osteoporosis 12/26/2015  . Multiple fractures 12/26/2015  . Cough 04/09/2011  . ANXIETY DEPRESSION 04/10/2010  . CEREBRAL EMBOLISM WITHOUT MENTION INFARCT 04/10/2010  . Atrial fibrillation ([place]) 03/11/2010  . FATIGUE / MALAISE 03/11/2010   Tilman Neat, SPT This entire session was performed under direct supervision and direction of a licensed therapist/therapist assistant . I have personally read, edited and approve of the note as written.  Trotter,Margaret PT, DPT 03/17/2016, 8:21 AM  [place] MAIN [facility] SERVICES [address] [place], [place], [address] Phone: [phone]   Fax:  [phone]  Name: Monica Whitaker MRN: [id number] Date of Birth: [birth year]

## 2016-03-20 NOTE — Telephone Encounter (Signed)
I have rec'd pt's ins verification for [employer] and she has an estimated responsibility of $195.  Please make pt aware this is an estimate and we will not know an exact amt until insurance(s) has/have paid.  I have sent a copy of the summary of benefits to be scanned into pt's chart.    If pt cannot afford $195 for her injection, please advise her to contact [employer] at [phone] and select option #1 to see if she qualifies for one of their assistance programs.  If she qualifies they will instruct her how to proceed.   Once pt recs injection, please let me know actual injection date so I can update the [employer] portal.  If you have any questions, please let me know.  Thank you!

## 2016-03-23 ENCOUNTER — Encounter: Payer: Self-pay | Admitting: Family Medicine

## 2016-03-23 NOTE — Telephone Encounter (Signed)
Please advise as Kalman Shan has stated below.

## 2016-03-24 ENCOUNTER — Encounter: Payer: Self-pay | Admitting: Family Medicine

## 2016-03-24 NOTE — Telephone Encounter (Signed)
Pt left v/m requesting cb from [place] re; [employer]; pt can be reached at [phone].

## 2016-03-24 NOTE — Telephone Encounter (Signed)
Lm on pts vm requesting a call back 

## 2016-03-25 ENCOUNTER — Telehealth: Payer: Self-pay | Admitting: Family Medicine

## 2016-03-25 NOTE — Telephone Encounter (Signed)
Pt returned Waynetta's call re: [employer]. Please call back tomorrow. Thanks

## 2016-03-25 NOTE — Telephone Encounter (Signed)
Lm on pts vm requesting a call back on her house and cell phone

## 2016-03-26 ENCOUNTER — Ambulatory Visit: Payer: Medicare Other | Admitting: Physical Therapy

## 2016-03-26 ENCOUNTER — Other Ambulatory Visit: Payer: Medicare Other

## 2016-03-26 ENCOUNTER — Encounter: Payer: Self-pay | Admitting: Physical Therapy

## 2016-03-26 DIAGNOSIS — R2681 Unsteadiness on feet: Secondary | ICD-10-CM

## 2016-03-26 DIAGNOSIS — M6281 Muscle weakness (generalized): Secondary | ICD-10-CM | POA: Diagnosis not present

## 2016-03-26 NOTE — Therapy (Signed)
[place] MAIN [facility] SERVICES [address] [place], [place], [address] Phone: [phone]   Fax:  [phone]  Physical Therapy Treatment/Discharge Note   Patient Details  Name: Monica Whitaker MRN: [id number] Date of Birth: 05-05-1946 Referring Provider: Lucille Passy  Encounter Date: 03/26/2016      PT End of Session - 03/26/16 0852    Visit Number 15   Number of Visits 17   Date for PT Re-Evaluation 03/25/16   Authorization Type G code 5   Authorization Time Period 10   PT Start Time 0800   PT Stop Time 0845   PT Time Calculation (min) 45 min   Equipment Utilized During Treatment Gait belt   Activity Tolerance Patient tolerated treatment well   Behavior During Therapy [facility] for tasks assessed/performed      Past Medical History:  Diagnosis Date  . Anxiety    controlled with meds  . Atrial fibrillation ([name])    paroxysmal, failed medical therapy with flecainide,  NSVT with tikosyn  . Bruises easily   . CVA (cerebral vascular accident) ([place]) 12/2007  . Depression    medically controlled   . Low blood pressure    no falls, but if gets up to fast  . Stroke ([place]) 7 years ago   Lasting effects on balance, different personality.  . Thyroid disease   . 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 ATRIAL FIBRILLATION ABLATION  10/18/12   PVI by Dr Rayann Heman  . ATRIAL FIBRILLATION ABLATION N/A 10/18/2012   Procedure: ATRIAL FIBRILLATION ABLATION;  Surgeon: Thompson Grayer, MD;  Location: [facility] CATH LAB;  Service: Cardiovascular;  Laterality: N/A;  . EYE SURGERY     on L/R eye, macular hole   . OPEN REDUCTION INTERNAL FIXATION (ORIF) DISTAL RADIAL FRACTURE Right 11/15/2014   Procedure: OPEN REDUCTION INTERNAL FIXATION (ORIF) RIGHT DISTAL RADIAL FRACTURE WITH ALLOGRAFT BONE GRAFT;  Surgeon: Roseanne Kaufman, MD;  Location: [place];  Service:  Orthopedics;  Laterality: Right;  . TEE WITHOUT CARDIOVERSION N/A 10/18/2012   Procedure: TRANSESOPHAGEAL ECHOCARDIOGRAM (TEE);  Surgeon: Lelon Perla, MD;  Location: [facility] ENDOSCOPY;  Service: Cardiovascular;  Laterality: N/A;  Pre-Ablation at 12pm  . TONSILLECTOMY    . VAGINAL HYSTERECTOMY      There were no vitals filed for this visit.      Subjective Assessment - 03/26/16 0808    Subjective Pt continues to report no falls since the start of therapy.  She has been able to walk outside without thinking about it.  She reports she continues to do 5 days a week of her exercise classes.     Currently in Pain? No/denies            [facility] PT Assessment - 03/26/16 0001      Strength   Right Shoulder ABduction 4+/5   Left Shoulder ABduction 4+/5   Right Elbow Flexion 4+/5   Right Elbow Extension 4+/5   Left Elbow Flexion 4+/5   Left Elbow Extension 4+/5   Right Hip Flexion 4/5   Left Hip Flexion 4/5   Right Knee Flexion 4+/5   Right Knee Extension 4+/5   Left Knee Flexion 5/5   Left Knee Extension 5/5   Right Ankle Dorsiflexion 5/5   Left Ankle Dorsiflexion 5/5     Standardized Balance Assessment   Five times sit  to stand comments  15 seconds, no UE support throughout    10 Meter Walk 1.3 m/s no assistance      Functional Gait  Assessment   Gait Level Surface Walks 20 ft in less than 5.5 sec, no assistive devices, good speed, no evidence for imbalance, normal gait pattern, deviates no more than 6 in outside of the 12 in walkway width.   Change in Gait Speed Able to smoothly change walking speed without loss of balance or gait deviation. Deviate no more than 6 in outside of the 12 in walkway width.   Gait with Horizontal Head Turns Performs head turns smoothly with no change in gait. Deviates no more than 6 in outside 12 in walkway width   Gait with Vertical Head Turns Performs head turns with no change in gait. Deviates no more than 6 in outside 12 in walkway width.   Gait and  Pivot Turn Pivot turns safely within 3 sec and stops quickly with no loss of balance.   Step Over Obstacle Is able to step over 2 stacked shoe boxes taped together (9 in total height) without changing gait speed. No evidence of imbalance.   Gait with Narrow Base of Support Ambulates 7-9 steps.   Gait with Eyes Closed Walks 20 ft, uses assistive device, slower speed, mild gait deviations, deviates 6-10 in outside 12 in walkway width. Ambulates 20 ft in less than 9 sec but greater than 7 sec.   Ambulating Backwards Walks 20 ft, uses assistive device, slower speed, mild gait deviations, deviates 6-10 in outside 12 in walkway width.   Steps Alternating feet, must use rail.   Total Score 26   FGA comment: 26/30 improved since 02/27/16      Treatment Strength testing assessed for progress 5 time sit to stand-15 seconds without use of UEs, indicating decreased falls risk  FGA-26/30, improved since previous check which was 22/26 10 m walk test performed in 1.40m/s which has improved since previous progress check which was 1.1 m/s indicating a full community ambulator  Standing hip flexion marches on trampoline, 2 sets x 10 reps each LE, CGA for stability, min VCs for upright posture and larger steps  Overhead press and oblique twist on trampoline with red weighted ball, 1 set x 15 reps, CGA for safety, min VCs for increase trunk flexion                           PT Education - 03/26/16 0850    Education provided Yes   Education Details exercise program, taking it easy on days when shes not feeling great    Person(s) Educated Patient   Methods Explanation;Demonstration;Verbal cues   Comprehension Verbalized understanding;Returned demonstration;Verbal cues required             PT Long Term Goals - 03/26/16 0859      PT LONG TERM GOAL #1   Title Patient will be independent in HEP in order to increase patient's ability to maintain gains achieved in therapy and assist with  return to PLOF.    Time 4   Period Weeks   Status Achieved     PT LONG TERM GOAL #2   Title Patient will decrease time take to perform 5 times sit to stand to 20 seconds without UE use in order to decrease her risk for falls.     Time 4   Period Weeks   Status Achieved  PT LONG TERM GOAL #3   Title Patient will improve functional gait assessment by 5 pts. in order to attain MCID for stroke and to decrease fear of falling and improve ease of community ambulation.   Time 4   Period Weeks   Status Achieved     PT LONG TERM GOAL #4   Title Patient will increase overall strength to 4+/5 in both UE and LE in order to complete transfers safely with decreased risk for falls.    Time 4   Period Weeks   Status Achieved     PT LONG TERM GOAL #5   Title Patient will be able to negotiate one 4" curb with no UE support independently in a safe manner in order to allow for greater community negotiation safely.    Time 4   Period Weeks   Status Achieved     PT LONG TERM GOAL #6   Title Patient will improve functional gait assessment >22/30 to decrease fear of falling and improve ease of community ambulation.   Time 4   Period Weeks   Status Achieved               Plan - Apr 09, 2016 0853    Clinical Impression Statement Pt reports that she has been consistently walking without looking at the ground and continues to report no falls.  All of her outcomes signficantly improved since previous prgoress check.  Pts overall LE strength was 4+/5 with no complaints of pain with any movement.  She was able to perform 5 time sit to stand in 15 seconds indicating decreased falls risk with no use of UEs.  Pts FGA improved from 22/30 to 26/30 with slower gait speed walking backwards or with eyes closed.  Her 10 m gait speed improved from 1.1 m/s to 1.3 m/s indicating she is a full community ambulator. She is prepared for discharge to continue going to her exercise classes and HEP at home.    Pt was  advised on continuing his exercises classes at home and to call with any questions or concerns regarding her HEP.     Rehab Potential Good   Clinical Impairments Affecting Rehab Potential Positive Factors: exercises regularly, motivated Negative Factors: chronic stroke, anxiety, depression Clinical Impression: Evolving-multiple falls, decrease strength, moderate perception of handicap on DHI   PT Frequency 1x / week   PT Duration 4 weeks   PT Treatment/Interventions ADLs/Self Care Home Management;Aquatic Therapy;Cryotherapy;Electrical Stimulation;Moist Heat;Gait training;Stair training;Functional mobility training;Therapeutic activities;Therapeutic exercise;Balance training;Neuromuscular re-education;Patient/family education;Manual techniques;Energy conservation   PT Next Visit Plan heel to toe walking, static tandem/single leg stance, dynamic walking with dual task or head challenges, steps/curbs   PT Home Exercise Plan continue HEP as given    Consulted and Agree with Plan of Care Patient      Patient will benefit from skilled therapeutic intervention in order to improve the following deficits and impairments:  Abnormal gait, Decreased activity tolerance, Decreased balance, Decreased coordination, Decreased endurance, Decreased mobility, Decreased safety awareness, Decreased strength, Impaired sensation, Postural dysfunction, Improper body mechanics, Pain  Visit Diagnosis: Unsteadiness on feet  Muscle weakness (generalized)       G-Codes - 2016-04-09 1521    Functional Assessment Tool Used clinical judgement, strength   Functional Limitation Mobility: Walking and moving around   Mobility: Walking and Moving Around Current Status [id number]) At least 1 percent but less than 20 percent impaired, limited or restricted   Mobility: Walking and Moving Around Goal Status [id number]) At least 1  percent but less than 20 percent impaired, limited or restricted      Problem List Patient Active Problem  List   Diagnosis Date Noted  . Frozen shoulder 12/26/2015  . Osteoporosis 12/26/2015  . Multiple fractures 12/26/2015  . Cough 04/09/2011  . ANXIETY DEPRESSION 04/10/2010  . CEREBRAL EMBOLISM WITHOUT MENTION INFARCT 04/10/2010  . Atrial fibrillation ([place]) 03/11/2010  . FATIGUE / MALAISE 03/11/2010   Stacy Gardner, SPT  This entire session was performed under direct supervision and direction of a licensed therapist/therapist assistant . I have personally read, edited and approve of the note as written.  Trotter,Margaret PT, DPT 03/26/2016, 3:22 PM  [place] MAIN [facility] SERVICES [address] [place], [place], [address] Phone: [phone]   Fax:  [phone]  Name: Monica Whitaker MRN: [id number] Date of Birth: [birth year]

## 2016-03-26 NOTE — Telephone Encounter (Signed)
Spoke to pt and advised regarding prolia. Calcium lab scheduled

## 2016-03-27 ENCOUNTER — Other Ambulatory Visit (INDEPENDENT_AMBULATORY_CARE_PROVIDER_SITE_OTHER): Payer: Medicare Other

## 2016-03-27 DIAGNOSIS — M810 Age-related osteoporosis without current pathological fracture: Secondary | ICD-10-CM | POA: Diagnosis not present

## 2016-03-27 LAB — CALCIUM: Calcium: 9 mg/dL (ref 8.4–10.5)

## 2016-04-01 ENCOUNTER — Ambulatory Visit (INDEPENDENT_AMBULATORY_CARE_PROVIDER_SITE_OTHER): Payer: Medicare Other | Admitting: *Deleted

## 2016-04-01 DIAGNOSIS — M810 Age-related osteoporosis without current pathological fracture: Secondary | ICD-10-CM | POA: Diagnosis not present

## 2016-04-01 MED ORDER — DENOSUMAB 60 MG/ML ~~LOC~~ SOLN
60.0000 mg | Freq: Once | SUBCUTANEOUS | Status: AC
  Administered 2016-04-01: 60 mg via SUBCUTANEOUS

## 2016-04-07 ENCOUNTER — Other Ambulatory Visit: Payer: Self-pay | Admitting: Family Medicine

## 2016-04-08 ENCOUNTER — Encounter: Payer: Self-pay | Admitting: Family Medicine

## 2016-04-15 ENCOUNTER — Other Ambulatory Visit: Payer: Self-pay | Admitting: Family Medicine

## 2016-04-22 DIAGNOSIS — L218 Other seborrheic dermatitis: Secondary | ICD-10-CM | POA: Diagnosis not present

## 2016-04-22 DIAGNOSIS — L570 Actinic keratosis: Secondary | ICD-10-CM | POA: Diagnosis not present

## 2016-04-30 ENCOUNTER — Other Ambulatory Visit: Payer: Self-pay | Admitting: Family Medicine

## 2016-04-30 DIAGNOSIS — Z01419 Encounter for gynecological examination (general) (routine) without abnormal findings: Secondary | ICD-10-CM | POA: Insufficient documentation

## 2016-05-04 ENCOUNTER — Telehealth: Payer: Self-pay | Admitting: Internal Medicine

## 2016-05-04 NOTE — Telephone Encounter (Signed)
Lenna Sciara is calling the patient to schedule

## 2016-05-04 NOTE — Telephone Encounter (Signed)
New message     Pt calling to make an appt with the doc and nothing is available before Dec. Pt only wants to see the doc and states that she is slipping in and out of AFIB. Please call.

## 2016-05-06 ENCOUNTER — Other Ambulatory Visit: Payer: Medicare Other

## 2016-05-06 ENCOUNTER — Ambulatory Visit (INDEPENDENT_AMBULATORY_CARE_PROVIDER_SITE_OTHER): Payer: Medicare Other

## 2016-05-06 VITALS — BP 100/70 | HR 60 | Temp 98.1°F | Ht 69.0 in | Wt 170.2 lb

## 2016-05-06 DIAGNOSIS — Z01419 Encounter for gynecological examination (general) (routine) without abnormal findings: Secondary | ICD-10-CM

## 2016-05-06 DIAGNOSIS — Z23 Encounter for immunization: Secondary | ICD-10-CM

## 2016-05-06 DIAGNOSIS — Z0000 Encounter for general adult medical examination without abnormal findings: Secondary | ICD-10-CM

## 2016-05-06 LAB — COMPREHENSIVE METABOLIC PANEL
ALT: 16 U/L (ref 0–35)
AST: 20 U/L (ref 0–37)
Albumin: 4 g/dL (ref 3.5–5.2)
Alkaline Phosphatase: 53 U/L (ref 39–117)
BILIRUBIN TOTAL: 0.6 mg/dL (ref 0.2–1.2)
BUN: 21 mg/dL (ref 6–23)
CALCIUM: 8.9 mg/dL (ref 8.4–10.5)
CHLORIDE: 101 meq/L (ref 96–112)
CO2: 29 meq/L (ref 19–32)
CREATININE: 0.63 mg/dL (ref 0.40–1.20)
GFR: 99.65 mL/min (ref >60.00)
Glucose, Bld: 92 mg/dL (ref 70–99)
Potassium: 4.4 mEq/L (ref 3.5–5.1)
SODIUM: 138 meq/L (ref 135–145)
Total Protein: 6.9 g/dL (ref 6.0–8.3)

## 2016-05-06 LAB — CBC WITH DIFFERENTIAL/PLATELET
BASOS ABS: 0 10*3/uL (ref 0.0–0.1)
Basophils Relative: 0.4 % (ref 0.0–3.0)
EOS ABS: 0.3 10*3/uL (ref 0.0–0.7)
Eosinophils Relative: 5.9 % — ABNORMAL HIGH (ref 0.0–5.0)
HEMATOCRIT: 39 % (ref 36.0–46.0)
HEMOGLOBIN: 13.3 g/dL (ref 12.0–15.0)
LYMPHS PCT: 46.7 % — AB (ref 12.0–46.0)
Lymphs Abs: 2.1 10*3/uL (ref 0.7–4.0)
MCHC: 34.2 g/dL (ref 30.0–36.0)
MCV: 89.9 fl (ref 78.0–100.0)
MONO ABS: 0.5 10*3/uL (ref 0.1–1.0)
Monocytes Relative: 11.1 % (ref 3.0–12.0)
NEUTROS ABS: 1.6 10*3/uL (ref 1.4–7.7)
Neutrophils Relative %: 35.9 % — ABNORMAL LOW (ref 43.0–77.0)
PLATELETS: 289 10*3/uL (ref 150.0–400.0)
RBC: 4.34 Mil/uL (ref 3.87–5.11)
RDW: 14.5 % (ref 11.5–15.5)
WBC: 4.5 10*3/uL (ref 4.0–10.5)

## 2016-05-06 LAB — LIPID PANEL
CHOL/HDL RATIO: 2
Cholesterol: 218 mg/dL — ABNORMAL HIGH (ref 0–200)
HDL: 92.6 mg/dL (ref >39.00)
LDL CALC: 113 mg/dL — AB (ref 0–99)
NONHDL: 125.4
TRIGLYCERIDES: 62 mg/dL (ref 0.0–149.0)
VLDL: 12.4 mg/dL (ref 0.0–40.0)

## 2016-05-06 LAB — TSH: TSH: 1.62 u[IU]/mL (ref 0.35–4.50)

## 2016-05-06 NOTE — Progress Notes (Signed)
I reviewed health advisor's note, was available for consultation, and agree with documentation and plan.   Signed,  Lovelyn Sheeran T. Markas Aldredge, MD  

## 2016-05-06 NOTE — Progress Notes (Signed)
Subjective:   Monica Whitaker is a 70 y.o. female who presents for Medicare Annual (Subsequent) preventive examination.  Review of Systems:  N/A Cardiac Risk Factors include: advanced age (>5men, >80 women)     Objective:     Vitals: BP 100/70 (BP Location: Left Arm, Patient Position: Sitting, Cuff Size: Normal)   Pulse 60   Temp 98.1 F (36.7 C) (Oral)   Ht 5\' 9"  (1.753 m) Comment: no shoes  Wt 170 lb 4 oz (77.2 kg)   SpO2 96%   BMI 25.14 kg/m   Body mass index is 25.14 kg/m.   Tobacco History  Smoking Status  . Never Smoker  Smokeless Tobacco  . Never Used     Counseling given: No   Past Medical History:  Diagnosis Date  . Anxiety    controlled with meds  . Atrial fibrillation ([name])    paroxysmal, failed medical therapy with flecainide,  NSVT with tikosyn  . Bruises easily   . CVA (cerebral vascular accident) ([place]) 12/2007  . Depression    medically controlled   . Low blood pressure    no falls, but if gets up to fast  . Stroke ([place]) 7 years ago   Lasting effects on balance, different personality.  . Thyroid disease   . 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 ATRIAL FIBRILLATION ABLATION  10/18/12   PVI by Dr Rayann Heman  . ATRIAL FIBRILLATION ABLATION N/A 10/18/2012   Procedure: ATRIAL FIBRILLATION ABLATION;  Surgeon: Thompson Grayer, MD;  Location: [facility] CATH LAB;  Service: Cardiovascular;  Laterality: N/A;  . EYE SURGERY     on L/R eye, macular hole   . OPEN REDUCTION INTERNAL FIXATION (ORIF) DISTAL RADIAL FRACTURE Right 11/15/2014   Procedure: OPEN REDUCTION INTERNAL FIXATION (ORIF) RIGHT DISTAL RADIAL FRACTURE WITH ALLOGRAFT BONE GRAFT;  Surgeon: Roseanne Kaufman, MD;  Location: [place];  Service: Orthopedics;  Laterality: Right;  . TEE WITHOUT CARDIOVERSION N/A 10/18/2012   Procedure: TRANSESOPHAGEAL ECHOCARDIOGRAM (TEE);  Surgeon: Lelon Perla, MD;   Location: [facility] ENDOSCOPY;  Service: Cardiovascular;  Laterality: N/A;  Pre-Ablation at 12pm  . TONSILLECTOMY    . VAGINAL HYSTERECTOMY     Family History  Problem Relation Age of Onset  . Multiple sclerosis Father    History  Sexual Activity  . Sexual activity: No    Outpatient Encounter Prescriptions as of 05/06/2016  Medication Sig  . acetaminophen (TYLENOL) 500 MG tablet Take 2 tablets by mouth every 6 hours as needed for pain  . calcium carbonate (OS-CAL) 600 MG TABS Take 1,000 mg by mouth daily with breakfast.   . cholecalciferol (VITAMIN D) 1000 UNITS tablet Take 2,000 Units by mouth daily.   Marland Kitchen dextroamphetamine (DEXTROSTAT) 5 MG tablet Take 5 mg by mouth daily.   Marland Kitchen diltiazem (CARDIZEM CD) 180 MG 24 hr capsule Take 1 capsule (180 mg total) by mouth daily.  . DULoxetine (CYMBALTA) 30 MG capsule Take 1 capsule by mouth at bedtime. Reported on 01/15/2016  . DULoxetine (CYMBALTA) 60 MG capsule Take 60 mg by mouth every morning.   . flecainide (TAMBOCOR) 100 MG tablet TAKE 1 TABLET (100 MG TOTAL) BY MOUTH 2 (TWO) TIMES DAILY.  Marland Kitchen levothyroxine (SYNTHROID, LEVOTHROID) 25 MCG tablet TAKE 1 TABLET (25 MCG TOTAL) BY MOUTH DAILY. OFFICE VISIT WITH LABS REQUIRED FOR ADDITIONAL REFILLS  . Multiple Vitamin (MULTIVITAMIN) tablet Take  1 tablet by mouth daily.    . Oral Electrolytes (BUFFERED SALT PO) Take 1 capsule by mouth daily as needed (Electrolyte/Salt supplement).   . Timolol Maleate (TIMOPTIC OP) Place 1 drop into the left eye daily.   . vitamin B-12 (CYANOCOBALAMIN) 1000 MCG tablet Take 1,000 mcg by mouth daily.  . vitamin C (ASCORBIC ACID) 500 MG tablet Take 500 mg by mouth daily.  . vitamin E 400 UNIT capsule Take 400 Units by mouth daily.    Alveda Reasons 20 MG TABS tablet TAKE 1 TABLET BY MOUTH EVERY DAY  . [DISCONTINUED] chlorpheniramine-HYDROcodone (TUSSIONEX PENNKINETIC ER) 10-8 MG/5ML SUER Take 5 mLs by mouth at bedtime as needed.  . [DISCONTINUED] doxycycline (VIBRA-TABS) 100 MG tablet  Take 1 tablet (100 mg total) by mouth 2 (two) times daily.   No facility-administered encounter medications on file as of 05/06/2016.     Activities of Daily Living In your present state of health, do you have any difficulty performing the following activities: 05/06/2016 05/06/2016  Hearing? - Y  Vision? - N  Difficulty concentrating or making decisions? (No Data) Y  Walking or climbing stairs? - N  Dressing or bathing? - N  Doing errands, shopping? - N  Conservation officer, nature and eating ? - N  Using the Toilet? - N  In the past six months, have you accidently leaked urine? - Y  Do you have problems with loss of bowel control? - N  Managing your Medications? - N  Managing your Finances? - N  Housekeeping or managing your Housekeeping? - Y  Some recent data might be hidden    Patient Care Team: Lucille Passy, MD as PCP - General Sherran Needs, NP as Nurse Practitioner (Nurse Practitioner) Thompson Grayer, MD as Consulting Physician (Cardiology) Roseanne Kaufman, MD as Consulting Physician (Orthopedic Surgery) Josefine Class, MD as Referring Physician (Gastroenterology)    Assessment:     Hearing Screening   125Hz  250Hz  500Hz  1000Hz  2000Hz  3000Hz  4000Hz  6000Hz  8000Hz   Right ear:   0 40 40  40    Left ear:   40 40 40  40    Vision Screening Comments: Last vision exam with Dr. Sandra Cockayne in May 2017  Exercise Activities and Dietary recommendations Current Exercise Habits: Home exercise routine, Type of exercise: strength training/weights;stretching;walking;Other - see comments (water aerobics), Time (Minutes): 60, Frequency (Times/Week): 7, Weekly Exercise (Minutes/Week): 420, Intensity: Moderate, Exercise limited by: None identified  Goals    . Increase physical activity          Starting 05/06/2016, I will continue to exercise at least 60 min daily.       Fall Risk Fall Risk  05/06/2016 04/04/2015 11/10/2014  Falls in the past year? - Yes No  Number falls in past yr: 1 1 -  Injury  with Fall? Yes Yes -  Risk for fall due to : History of fall(s) - -  Follow up Falls evaluation completed - -   Depression Screen PHQ 2/9 Scores 05/06/2016 04/04/2015 11/10/2014  PHQ - 2 Score 3 1 0  PHQ- 9 Score 7 - -     Cognitive Testing MMSE - Mini Mental State Exam 05/06/2016  Orientation to time 5  Orientation to Place 5  Registration 3  Attention/ Calculation 0  Recall 2  Recall-comments pt was unable to recall 1 of 3 words  Language- name 2 objects 0  Language- repeat 1  Language- follow 3 step command 3  Language- read & follow  direction 0  Write a sentence 0  Copy design 0  Total score 19  PLEASE NOTE: A Mini-Cog screen was completed. Maximum score is 20. A value of 0 denotes this part of [place]stein MMSE was not completed or the patient failed this part of the Mini-Cog screening.   Mini-Cog Screening Orientation to Time - Max 5 pts Orientation to Place - Max 5 pts Registration - Max 3 pts Recall - Max 3 pts Language Repeat - Max 1 pts Language Follow 3 Step Command - Max 3 pts  Immunization History  Administered Date(s) Administered  . Influenza Split 05/21/2011, 05/31/2012  . Influenza Whole 06/02/2010  . Influenza, High Dose Seasonal PF 05/24/2014  . Influenza,inj,Quad PF,36+ Mos 04/04/2015, 05/06/2016  . Pneumococcal Conjugate-13 10/02/2013  . Pneumococcal Polysaccharide-23 04/04/2015  . Td 04/18/2008  . Tdap 10/30/2015  . Zoster 04/18/2008   Screening Tests Health Maintenance  Topic Date Due  . MAMMOGRAM  06/12/2017  . COLONOSCOPY  03/06/2024  . TETANUS/TDAP  10/29/2025  . INFLUENZA VACCINE  Completed  . DEXA SCAN  Completed  . ZOSTAVAX  Completed  . Hepatitis C Screening  Completed  . PNA vac Low Risk Adult  Completed      Plan:    I have personally reviewed and addressed the Medicare Annual Wellness questionnaire and have noted the following in the patient's chart:  A. Medical and social history B. Use of alcohol, tobacco or illicit drugs   C. Current medications and supplements D. Functional ability and status E.  Nutritional status F.  Physical activity G. Advance directives H. List of other physicians I.  Hospitalizations, surgeries, and ER visits in previous 12 months J.  [place] to include hearing, vision, cognitive, depression L. Referrals and appointments - none  In addition, I have reviewed and discussed with patient certain preventive protocols, quality metrics, and best practice recommendations. A written personalized care plan for preventive services as well as general preventive health recommendations were provided to patient.  See attached scanned questionnaire for additional information.   Signed,   Lindell Noe, MHA, BS, LPN Health Advisor

## 2016-05-06 NOTE — Progress Notes (Signed)
PCP notes:   Health maintenance:  Flu - administered   Abnormal screenings:   Hearing - failed Fall risk - hx of fall with injury Depression score: 7 Mini-Cog score: 19/20  Patient concerns:   Pt feels overwhelmed with change in spouse's cognitive status.   Nurse concerns:  None  Next PCP appt:   05/13/2016 @ 1215

## 2016-05-06 NOTE — Patient Instructions (Signed)
Monica Whitaker , Thank you for taking time to come for your Medicare Wellness Visit. I appreciate your ongoing commitment to your health goals. Please review the following plan we discussed and let me know if I can assist you in the future.   These are the goals we discussed: Goals    . Increase physical activity          Starting 05/06/2016, I will continue to exercise at least 60 min daily.        This is a list of the screening recommended for you and due dates:  Health Maintenance  Topic Date Due  . Mammogram  06/12/2017  . Colon Cancer Screening  03/06/2024  . Tetanus Vaccine  10/29/2025  . Flu Shot  Completed  . DEXA scan (bone density measurement)  Completed  . Shingles Vaccine  Completed  .  Hepatitis C: One time screening is recommended by Center for Disease Control  (CDC) for  adults born from [birth year] through 1964.   Completed  . Pneumonia vaccines  Completed   Preventive Care for Adults  A healthy lifestyle and preventive care can promote health and wellness. Preventive health guidelines for adults include the following key practices.  . A routine yearly physical is a good way to check with your health care provider about your health and preventive screening. It is a chance to share any concerns and updates on your health and to receive a thorough exam.  . Visit your dentist for a routine exam and preventive care every 6 months. Brush your teeth twice a day and floss once a day. Good oral hygiene prevents tooth decay and gum disease.  . The frequency of eye exams is based on your age, health, family medical history, use  of contact lenses, and other factors. Follow your health care provider's ecommendations for frequency of eye exams.  . Eat a healthy diet. Foods like vegetables, fruits, whole grains, low-fat dairy products, and lean protein foods contain the nutrients you need without too many calories. Decrease your intake of foods high in solid fats, added sugars, and salt.  Eat the right amount of calories for you. Get information about a proper diet from your health care provider, if necessary.  . Regular physical exercise is one of the most important things you can do for your health. Most adults should get at least 150 minutes of moderate-intensity exercise (any activity that increases your heart rate and causes you to sweat) each week. In addition, most adults need muscle-strengthening exercises on 2 or more days a week.  Silver Sneakers may be a benefit available to you. To determine eligibility, you may visit the website: www.silversneakers.com or contact program at 918-536-8851 Mon-Fri between 8AM-8PM.   . Maintain a healthy weight. The body mass index (BMI) is a screening tool to identify possible weight problems. It provides an estimate of body fat based on height and weight. Your health care provider can find your BMI and can help you achieve or maintain a healthy weight.   For adults 20 years and older: ? A BMI below 18.5 is considered underweight. ? A BMI of 18.5 to 24.9 is normal. ? A BMI of 25 to 29.9 is considered overweight. ? A BMI of 30 and above is considered obese.   . Maintain normal blood lipids and cholesterol levels by exercising and minimizing your intake of saturated fat. Eat a balanced diet with plenty of fruit and vegetables. Blood tests for lipids and cholesterol should begin  at age 25 and be repeated every 5 years. If your lipid or cholesterol levels are high, you are over 50, or you are at high risk for heart disease, you may need your cholesterol levels checked more frequently. Ongoing high lipid and cholesterol levels should be treated with medicines if diet and exercise are not working.  . If you smoke, find out from your health care provider how to quit. If you do not use tobacco, please do not start.  . If you choose to drink alcohol, please do not consume more than 2 drinks per day. One drink is considered to be 12 ounces (355  mL) of beer, 5 ounces (148 mL) of wine, or 1.5 ounces (44 mL) of liquor.  . If you are 69-21 years old, ask your health care provider if you should take aspirin to prevent strokes.  . Use sunscreen. Apply sunscreen liberally and repeatedly throughout the day. You should seek shade when your shadow is shorter than you. Protect yourself by wearing long sleeves, pants, a wide-brimmed hat, and sunglasses year round, whenever you are outdoors.  . Once a month, do a whole body skin exam, using a mirror to look at the skin on your back. Tell your health care provider of new moles, moles that have irregular borders, moles that are larger than a pencil eraser, or moles that have changed in shape or color.

## 2016-05-06 NOTE — Progress Notes (Signed)
Pre visit review using our clinic review tool, if applicable. No additional management support is needed unless otherwise documented below in the visit note. 

## 2016-05-07 ENCOUNTER — Ambulatory Visit (INDEPENDENT_AMBULATORY_CARE_PROVIDER_SITE_OTHER): Payer: Medicare Other | Admitting: Internal Medicine

## 2016-05-07 ENCOUNTER — Encounter: Payer: Self-pay | Admitting: Internal Medicine

## 2016-05-07 VITALS — BP 106/72 | HR 65 | Ht 69.0 in | Wt 169.2 lb

## 2016-05-07 DIAGNOSIS — I480 Paroxysmal atrial fibrillation: Secondary | ICD-10-CM | POA: Diagnosis not present

## 2016-05-07 MED ORDER — DILTIAZEM HCL ER COATED BEADS 240 MG PO CP24: 240.0000 mg | ORAL_CAPSULE | Freq: Every day | ORAL | 3 refills | Status: DC

## 2016-05-07 NOTE — Patient Instructions (Signed)
Medication Instructions:  Your physician has recommended you make the following change in your medication:  1) Increase Diltiazem to 240 mg daily   Labwork: None ordered   Testing/Procedures: None ordered   Follow-Up: Your physician recommends that you schedule a follow-up appointment in: 3 months with Dr Rayann Heman   Any Other Special Instructions Will Be Listed Below (If Applicable).     If you need a refill on your cardiac medications before your next appointment, please call your pharmacy.

## 2016-05-08 NOTE — Progress Notes (Signed)
Electrophysiology Office Note   Date:  05/08/2016   ID:  Monica Whitaker, DOB 1946-05-19, MRN [id number]  PCP:  Arnette Norris, MD  Primary Electrophysiologist: Thompson Grayer, MD    Chief Complaint  Patient presents with  . Atrial Fibrillation     History of Present Illness: Monica Whitaker is a 70 y.o. female who presents today for electrophysiology evaluation.  She seems to be doing well.  She has occasional afib. Today, she denies symptoms of  chest pain, shortness of breath, orthopnea, PND, lower extremity edema, claudication, dizziness, presyncope, syncope, bleeding, or new neurologic sequela. The patient is tolerating medications without difficulties and is otherwise without complaint today.    Past Medical History:  Diagnosis Date  . Anxiety    controlled with meds  . Atrial fibrillation ([name])    paroxysmal, failed medical therapy with flecainide,  NSVT with tikosyn  . Bruises easily   . CVA (cerebral vascular accident) ([place]) 12/2007  . Depression    medically controlled   . Low blood pressure    no falls, but if gets up to fast  . Stroke ([place]) 7 years ago   Lasting effects on balance, different personality.  . Thyroid disease   . 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 ATRIAL FIBRILLATION ABLATION  10/18/12   PVI by Dr Rayann Heman  . ATRIAL FIBRILLATION ABLATION N/A 10/18/2012   Procedure: ATRIAL FIBRILLATION ABLATION;  Surgeon: Thompson Grayer, MD;  Location: [facility] CATH LAB;  Service: Cardiovascular;  Laterality: N/A;  . EYE SURGERY     on L/R eye, macular hole   . OPEN REDUCTION INTERNAL FIXATION (ORIF) DISTAL RADIAL FRACTURE Right 11/15/2014   Procedure: OPEN REDUCTION INTERNAL FIXATION (ORIF) RIGHT DISTAL RADIAL FRACTURE WITH ALLOGRAFT BONE GRAFT;  Surgeon: Roseanne Kaufman, MD;  Location: [place];  Service: Orthopedics;  Laterality: Right;  . TEE WITHOUT CARDIOVERSION N/A  10/18/2012   Procedure: TRANSESOPHAGEAL ECHOCARDIOGRAM (TEE);  Surgeon: Lelon Perla, MD;  Location: [facility] ENDOSCOPY;  Service: Cardiovascular;  Laterality: N/A;  Pre-Ablation at 12pm  . TONSILLECTOMY    . VAGINAL HYSTERECTOMY       Current Outpatient Prescriptions  Medication Sig Dispense Refill  . acetaminophen (TYLENOL) 500 MG tablet Take 2 tablets by mouth every 6 hours as needed for pain    . calcium carbonate (OS-CAL) 600 MG TABS Take 600 mg by mouth daily with breakfast.     . cholecalciferol (VITAMIN D) 1000 UNITS tablet Take 2,000 Units by mouth daily.     Marland Kitchen denosumab (PROLIA) 60 MG/ML SOLN injection Inject 60 mg into the skin every 6 (six) months. Administer in upper arm, thigh, or abdomen    . dextroamphetamine (DEXTROSTAT) 5 MG tablet Take 5 mg by mouth daily.     Marland Kitchen diltiazem (CARDIZEM CD) 240 MG 24 hr capsule Take 1 capsule (240 mg total) by mouth daily. Pt takes once extra tablet daily as needed for AFIB 90 capsule 3  . DULoxetine (CYMBALTA) 30 MG capsule Take 1 capsule by mouth at bedtime. Reported on 01/15/2016    . DULoxetine (CYMBALTA) 60 MG capsule Take 60 mg by mouth every morning.     . flecainide (TAMBOCOR) 100 MG tablet TAKE 1 TABLET (100 MG TOTAL) BY MOUTH 2 (TWO) TIMES DAILY. 180 tablet 3  . levothyroxine (SYNTHROID, LEVOTHROID) 25 MCG tablet TAKE 1 TABLET (25 MCG TOTAL) BY  MOUTH DAILY. OFFICE VISIT WITH LABS REQUIRED FOR ADDITIONAL REFILLS 30 tablet 0  . Multiple Vitamin (MULTIVITAMIN) tablet Take 1 tablet by mouth daily.      . Oral Electrolytes (BUFFERED SALT PO) Take 1 capsule by mouth daily as needed (Electrolyte/Salt supplement).     . Timolol Maleate (TIMOPTIC OP) Place 1 drop into the left eye daily.     . vitamin B-12 (CYANOCOBALAMIN) 1000 MCG tablet Take 1,000 mcg by mouth daily.    . vitamin C (ASCORBIC ACID) 500 MG tablet Take 500 mg by mouth daily.    . vitamin E 400 UNIT capsule Take 400 Units by mouth daily.      Alveda Reasons 20 MG TABS tablet TAKE 1  TABLET BY MOUTH EVERY DAY 90 tablet 2   No current facility-administered medications for this visit.     Allergies:   Darvon; Propoxyphene; Propoxyphene n-acetaminophen; and Levofloxacin   Social History:  The patient  reports that she has never smoked. She has never used smokeless tobacco. She reports that she drinks about 0.5 oz of alcohol per week . She reports that she does not use drugs.   Family History:  The patient's  family history includes Multiple sclerosis in her father.    ROS:  Please see the history of present illness.   All other systems are reviewed and negative.    PHYSICAL EXAM: VS:  BP 106/72   Pulse 65   Ht 5\' 9"  (1.753 m)   Wt 169 lb 3.2 oz (76.7 kg)   BMI 24.99 kg/m  , BMI Body mass index is 24.99 kg/m. GEN: Well nourished, well developed, in no acute distress  HEENT: normal  Neck: no JVD, carotid bruits, or masses Cardiac: RRR; no murmurs, rubs, or gallops,no edema  Respiratory:  clear to auscultation bilaterally, normal work of breathing GI: soft, nontender, nondistended, + BS MS: R wrist is in a splint Skin: warm and dry  Neuro:  Strength and sensation are intact Psych: euthymic mood, full affect  EKG:  EKG is ordered today. The ekg ordered today shows sinus rhythm    Recent Labs: 05/06/2016: ALT 16; BUN 21; Creatinine, Ser 0.63; Hemoglobin 13.3; Platelets 289.0; Potassium 4.4; Sodium 138; TSH 1.62    Lipid Panel     Component Value Date/Time   CHOL 218 (H) 05/06/2016 1412   TRIG 62.0 05/06/2016 1412   HDL 92.60 05/06/2016 1412   CHOLHDL 2 05/06/2016 1412   VLDL 12.4 05/06/2016 1412   LDLCALC 113 (H) 05/06/2016 1412   LDLDIRECT 123.5 09/18/2013 0844     Wt Readings from Last 3 Encounters:  05/07/16 169 lb 3.2 oz (76.7 kg)  05/06/16 170 lb 4 oz (77.2 kg)  03/04/16 169 lb 8 oz (76.9 kg)     ASSESSMENT AND PLAN:  1. Persistent Afib, s/p PVI 10/08/12.  Continues to have occasional afib but does not wish to repeat ablation at this  time. Increase diltiazem Continue xarelto (Chads2vasc 4)  Return to see me in 3 months per patient request.    Current medicines are reviewed at length with the patient today.   The patient does not have concerns regarding her medicines.  The following changes were made today:  none  Signed, Thompson Grayer, MD    [place] [address] [phone] (office) [phone] (fax)

## 2016-05-13 ENCOUNTER — Ambulatory Visit (INDEPENDENT_AMBULATORY_CARE_PROVIDER_SITE_OTHER): Payer: Medicare Other | Admitting: Family Medicine

## 2016-05-13 ENCOUNTER — Other Ambulatory Visit: Payer: Self-pay | Admitting: Internal Medicine

## 2016-05-13 ENCOUNTER — Encounter: Payer: Self-pay | Admitting: Family Medicine

## 2016-05-13 VITALS — BP 128/68 | HR 75 | Temp 98.6°F | Ht 68.5 in | Wt 170.5 lb

## 2016-05-13 DIAGNOSIS — I480 Paroxysmal atrial fibrillation: Secondary | ICD-10-CM

## 2016-05-13 DIAGNOSIS — Z01419 Encounter for gynecological examination (general) (routine) without abnormal findings: Secondary | ICD-10-CM

## 2016-05-13 DIAGNOSIS — I669 Occlusion and stenosis of unspecified cerebral artery: Secondary | ICD-10-CM

## 2016-05-13 DIAGNOSIS — F341 Dysthymic disorder: Secondary | ICD-10-CM

## 2016-05-13 DIAGNOSIS — T07XXXA Unspecified multiple injuries, initial encounter: Secondary | ICD-10-CM

## 2016-05-13 DIAGNOSIS — M8000XP Age-related osteoporosis with current pathological fracture, unspecified site, subsequent encounter for fracture with malunion: Secondary | ICD-10-CM | POA: Diagnosis not present

## 2016-05-13 MED ORDER — LEVOTHYROXINE SODIUM 25 MCG PO TABS: 25.0000 ug | ORAL_TABLET | Freq: Every day | ORAL | 2 refills | Status: DC

## 2016-05-13 NOTE — Assessment & Plan Note (Signed)
Symptoms controlled on current rxs.

## 2016-05-13 NOTE — Assessment & Plan Note (Signed)
Followed by cardiology. Rate controlled. On xarelto for anticoagulation.

## 2016-05-13 NOTE — Assessment & Plan Note (Signed)
On prolia 

## 2016-05-13 NOTE — Assessment & Plan Note (Signed)
Reviewed preventive care protocols, scheduled due services, and updated immunizations Discussed nutrition, exercise, diet, and healthy lifestyle.  

## 2016-05-13 NOTE — Progress Notes (Signed)
Pre visit review using our clinic review tool, if applicable. No additional management support is needed unless otherwise documented below in the visit note. 

## 2016-05-13 NOTE — Patient Instructions (Addendum)
Great to see you. Hang in there.

## 2016-05-13 NOTE — Progress Notes (Signed)
Subjective:   Patient ID: Monica Whitaker, female    DOB: October 07, 1945, 70 y.o.   MRN: [id number]  AMYNA KOWALL is a pleasant 70 y.o. year old female who presents to clinic today with No chief complaint on file. and follow up of chronic medical conditions on 05/13/2016  HPI: Medicare annual wellness visit with Candis Musa, RN on 05/06/16. Note reviewed.  Taking swimming and aerobics classes several days per week. Husband's dementia has worsened.  Afib- followed by Dr. Rayann Heman.  Was last seen on 05/07/16.  Note reviewed..   Diltiazem increased. On xarelto for anticoagulation. Advised follow up with Dr. Rayann Heman in 3 months.  Osteoporosis- DEXA 01/2106, on prolia. Weight training.  Hypothyroidism- Euthyroid on current dose of synthroid. Lab Results  Component Value Date   TSH 1.62 05/06/2016   Anxiety/depression- under more stress with her husband's decline in health.  Taking Cymbalta.   Lab Results  Component Value Date   CHOL 218 (H) 05/06/2016   HDL 92.60 05/06/2016   LDLCALC 113 (H) 05/06/2016   LDLDIRECT 123.5 09/18/2013   TRIG 62.0 05/06/2016   CHOLHDL 2 05/06/2016   Lab Results  Component Value Date   CREATININE 0.63 05/06/2016   Lab Results  Component Value Date   NA 138 05/06/2016   K 4.4 05/06/2016   CL 101 05/06/2016   CO2 29 05/06/2016   Lab Results  Component Value Date   ALT 16 05/06/2016   AST 20 05/06/2016   ALKPHOS 53 05/06/2016   BILITOT 0.6 05/06/2016   Lab Results  Component Value Date   TSH 1.62 05/06/2016   Lab Results  Component Value Date   WBC 4.5 05/06/2016   HGB 13.3 05/06/2016   HCT 39.0 05/06/2016   MCV 89.9 05/06/2016   PLT 289.0 05/06/2016      Patient Active Problem List   Diagnosis Date Noted  . Well woman exam 04/30/2016  . Frozen shoulder 12/26/2015  . Osteoporosis 12/26/2015  . Multiple fractures 12/26/2015  . ANXIETY DEPRESSION 04/10/2010  . Cerebral embolism 04/10/2010  . Atrial fibrillation ([place])  03/11/2010  . FATIGUE / MALAISE 03/11/2010   Past Medical History:  Diagnosis Date  . Anxiety    controlled with meds  . Atrial fibrillation ([name])    paroxysmal, failed medical therapy with flecainide,  NSVT with tikosyn  . Bruises easily   . CVA (cerebral vascular accident) ([place]) 12/2007  . Depression    medically controlled   . Low blood pressure    no falls, but if gets up to fast  . Stroke ([place]) 7 years ago   Lasting effects on balance, different personality.  . Thyroid disease   . 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 ATRIAL FIBRILLATION ABLATION  10/18/12   PVI by Dr Rayann Heman  . ATRIAL FIBRILLATION ABLATION N/A 10/18/2012   Procedure: ATRIAL FIBRILLATION ABLATION;  Surgeon: Thompson Grayer, MD;  Location[facility] CATH LAB;  Service: Cardiovascular;  Laterality: N/A;  . EYE SURGERY     on L/R eye, macular hole   . OPEN REDUCTION INTERNAL FIXATION (ORIF) DISTAL RADIAL FRACTURE Right 11/15/2014   Procedure: OPEN REDUCTION INTERNAL FIXATION (ORIF) RIGHT DISTAL RADIAL FRACTURE WITH ALLOGRAFT BONE GRAFT;  Surgeon: Roseanne Kaufman, MD;  Location: [place];  Service: Orthopedics;  Laterality: Right;  . TEE WITHOUT CARDIOVERSION N/A 10/18/2012   Procedure: TRANSESOPHAGEAL ECHOCARDIOGRAM (TEE);  Surgeon: Lelon Perla, MD;  Location: [facility] ENDOSCOPY;  Service: Cardiovascular;  Laterality: N/A;  Pre-Ablation at 12pm  . TONSILLECTOMY    . VAGINAL HYSTERECTOMY     Social History  Substance Use Topics  . Smoking status: Never Smoker  . Smokeless tobacco: Never Used  . Alcohol use 0.5 oz/week    1 Standard drinks or equivalent per week     Comment: regular   Family History  Problem Relation Age of Onset  . Multiple sclerosis Father    Allergies  Allergen Reactions  . Darvon Other (See Comments)    Severe panic attacks  . Propoxyphene Other (See Comments)    GI Upset Severe panic  attacks GI Upset  . Propoxyphene N-Acetaminophen Other (See Comments)    Severe panic attacks  . Levofloxacin Anxiety and Other (See Comments)    Causes panic attacks.   Current Outpatient Prescriptions on File Prior to Visit  Medication Sig Dispense Refill  . acetaminophen (TYLENOL) 500 MG tablet Take 2 tablets by mouth every 6 hours as needed for pain    . calcium carbonate (OS-CAL) 600 MG TABS Take 600 mg by mouth daily with breakfast.     . cholecalciferol (VITAMIN D) 1000 UNITS tablet Take 2,000 Units by mouth daily.     Marland Kitchen denosumab (PROLIA) 60 MG/ML SOLN injection Inject 60 mg into the skin every 6 (six) months. Administer in upper arm, thigh, or abdomen    . dextroamphetamine (DEXTROSTAT) 5 MG tablet Take 5 mg by mouth daily.     Marland Kitchen diltiazem (CARDIZEM CD) 240 MG 24 hr capsule Take 1 capsule (240 mg total) by mouth daily. Pt takes once extra tablet daily as needed for AFIB 90 capsule 3  . DULoxetine (CYMBALTA) 30 MG capsule Take 1 capsule by mouth at bedtime. Reported on 01/15/2016    . DULoxetine (CYMBALTA) 60 MG capsule Take 60 mg by mouth every morning.     . flecainide (TAMBOCOR) 100 MG tablet TAKE 1 TABLET (100 MG TOTAL) BY MOUTH 2 (TWO) TIMES DAILY. 180 tablet 3  . levothyroxine (SYNTHROID, LEVOTHROID) 25 MCG tablet TAKE 1 TABLET (25 MCG TOTAL) BY MOUTH DAILY. OFFICE VISIT WITH LABS REQUIRED FOR ADDITIONAL REFILLS 30 tablet 0  . Multiple Vitamin (MULTIVITAMIN) tablet Take 1 tablet by mouth daily.      . Oral Electrolytes (BUFFERED SALT PO) Take 1 capsule by mouth daily as needed (Electrolyte/Salt supplement).     . Timolol Maleate (TIMOPTIC OP) Place 1 drop into the left eye daily.     . vitamin B-12 (CYANOCOBALAMIN) 1000 MCG tablet Take 1,000 mcg by mouth daily.    . vitamin C (ASCORBIC ACID) 500 MG tablet Take 500 mg by mouth daily.    . vitamin E 400 UNIT capsule Take 400 Units by mouth daily.      Alveda Reasons 20 MG TABS tablet TAKE 1 TABLET BY MOUTH EVERY DAY 90 tablet 2   No  current facility-administered medications on file prior to visit.    The PMH, PSH, Social History, Family History, Medications, and allergies have been reviewed in [facility], and have been updated if relevant.. Review of Systems  Constitutional: Negative.   HENT: Negative.   Respiratory: Negative.   Cardiovascular: Negative.   Gastrointestinal: Negative.   Endocrine: Negative.   Genitourinary: Negative.   Musculoskeletal: Negative.   Skin: Negative.   Allergic/Immunologic: Negative.   Neurological: Negative.   Hematological: Negative.   Psychiatric/Behavioral: Negative.   All other systems reviewed and  are negative.       Objective:    There were no vitals taken for this visit.  Wt Readings from Last 3 Encounters:  05/07/16 169 lb 3.2 oz (76.7 kg)  05/06/16 170 lb 4 oz (77.2 kg)  03/04/16 169 lb 8 oz (76.9 kg)    Physical Exam    General:  Well-developed,well-nourished,in no acute distress; alert,appropriate and cooperative throughout examination Head:  normocephalic and atraumatic.   Eyes:  vision grossly intact, pupils equal, pupils round, and pupils reactive to light.   Ears:  R ear normal and L ear normal.   Nose:  no external deformity.   Mouth:  good dentition.   Neck:  No deformities, masses, or tenderness noted. Lungs:  Normal respiratory effort, chest expands symmetrically. Lungs are clear to auscultation, no crackles or wheezes. Heart:  Normal rate and regular rhythm. S1 and S2 normal without gallop, murmur, click, rub or other extra sounds. Abdomen:  Bowel sounds positive,abdomen soft and non-tender without masses, organomegaly or hernias noted. Msk:  No deformity or scoliosis noted of thoracic or lumbar spine.   Extremities:  No clubbing, cyanosis, edema, or deformity noted with normal full range of motion of all joints.   Neurologic:  alert & oriented X3 and gait normal.   Skin:  Intact without suspicious lesions or rashes Cervical Nodes:  No lymphadenopathy  noted Axillary Nodes:  No palpable lymphadenopathy Psych:  Cognition and judgment appear intact. Alert and cooperative with normal attention span and concentration. No apparent delusions, illusions, hallucinations      Assessment & Plan:   Well woman exam  ANXIETY DEPRESSION  Multiple fractures  Osteoporosis with current pathological fracture with malunion, unspecified osteoporosis type, subsequent encounter  Paroxysmal atrial fibrillation ([place])  Cerebral embolism No Follow-up on file.

## 2016-05-27 ENCOUNTER — Encounter: Payer: Self-pay | Admitting: Family Medicine

## 2016-05-28 NOTE — Telephone Encounter (Signed)
Information faxed as requested.

## 2016-06-12 DIAGNOSIS — M5136 Other intervertebral disc degeneration, lumbar region: Secondary | ICD-10-CM | POA: Diagnosis not present

## 2016-06-30 ENCOUNTER — Other Ambulatory Visit: Payer: Self-pay | Admitting: Family Medicine

## 2016-06-30 DIAGNOSIS — Z1231 Encounter for screening mammogram for malignant neoplasm of breast: Secondary | ICD-10-CM

## 2016-07-07 ENCOUNTER — Telehealth: Payer: Self-pay

## 2016-07-07 DIAGNOSIS — M545 Low back pain: Principal | ICD-10-CM

## 2016-07-07 DIAGNOSIS — M4120 Other idiopathic scoliosis, site unspecified: Secondary | ICD-10-CM

## 2016-07-07 DIAGNOSIS — G8929 Other chronic pain: Secondary | ICD-10-CM

## 2016-07-07 NOTE — Telephone Encounter (Signed)
Ortho referral placed

## 2016-07-07 NOTE — Telephone Encounter (Signed)
Pt last seen annual exam on 05/13/16; pt having a lot of back pain and weakness in rt leg; pt request referral to specialist for osteoporosis and scoliosis; pt has constant back pain and wants to see back specialist. Please advise.

## 2016-07-20 DIAGNOSIS — H40003 Preglaucoma, unspecified, bilateral: Secondary | ICD-10-CM | POA: Diagnosis not present

## 2016-07-21 ENCOUNTER — Ambulatory Visit
Admission: RE | Admit: 2016-07-21 | Discharge: 2016-07-21 | Disposition: A | Discharge status: Home / Self Care | Payer: Medicare Other | Source: Ambulatory Visit | Attending: Family Medicine | Admitting: Family Medicine

## 2016-07-21 DIAGNOSIS — Z1231 Encounter for screening mammogram for malignant neoplasm of breast: Secondary | ICD-10-CM | POA: Insufficient documentation

## 2016-07-22 ENCOUNTER — Encounter: Payer: Self-pay | Admitting: *Deleted

## 2016-07-29 DIAGNOSIS — G8929 Other chronic pain: Secondary | ICD-10-CM | POA: Diagnosis not present

## 2016-07-29 DIAGNOSIS — M5136 Other intervertebral disc degeneration, lumbar region: Secondary | ICD-10-CM | POA: Diagnosis not present

## 2016-07-29 DIAGNOSIS — M549 Dorsalgia, unspecified: Secondary | ICD-10-CM | POA: Diagnosis not present

## 2016-08-12 ENCOUNTER — Telehealth: Payer: Self-pay | Admitting: *Deleted

## 2016-08-12 ENCOUNTER — Ambulatory Visit (INDEPENDENT_AMBULATORY_CARE_PROVIDER_SITE_OTHER): Payer: Medicare Other | Admitting: Internal Medicine

## 2016-08-12 VITALS — BP 106/56 | HR 63 | Ht 68.5 in | Wt 177.0 lb

## 2016-08-12 DIAGNOSIS — I480 Paroxysmal atrial fibrillation: Secondary | ICD-10-CM

## 2016-08-12 MED ORDER — RIVAROXABAN 20 MG PO TABS: 20.0000 mg | ORAL_TABLET | Freq: Every day | ORAL | 2 refills | Status: DC

## 2016-08-12 NOTE — Patient Instructions (Signed)
Medication Instructions:  Your physician recommends that you continue on your current medications as directed. Please refer to the Current Medication list given to you today.   Labwork: None ordered   Testing/Procedures: None ordered   Follow-Up:  Your physician wants you to follow-up in: 6 months with Roderic Palau, NP and 12 months with Thompson Grayer You will receive a reminder letter in the mail two months in advance. If you don't receive a letter, please call our office to schedule the follow-up appointment.     Any Other Special Instructions Will Be Listed Below (If Applicable).     If you need a refill on your cardiac medications before your next appointment, please call your pharmacy.

## 2016-08-12 NOTE — Progress Notes (Signed)
Electrophysiology Office Note   Date:  08/12/2016   ID:  Monica Whitaker, DOB 1945-12-06, MRN [id number]  PCP:  Arnette Norris, MD  Primary Electrophysiologist: Thompson Grayer, MD    CC: Afib   History of Present Illness: Monica Whitaker is a 71 y.o. female who presents today for electrophysiology evaluation.  She seems to be doing well.  She has had no afib since I saw her last.  Today, she denies symptoms of  chest pain, shortness of breath, orthopnea, PND, lower extremity edema, claudication, dizziness, presyncope, syncope, bleeding, or new neurologic sequela. The patient is tolerating medications without difficulties and is otherwise without complaint today.    Past Medical History:  Diagnosis Date  . Anxiety    controlled with meds  . Atrial fibrillation ([name])    paroxysmal, failed medical therapy with flecainide,  NSVT with tikosyn  . Bruises easily   . CVA (cerebral vascular accident) ([place]) 12/2007  . Depression    medically controlled   . Low blood pressure    no falls, but if gets up to fast  . Stroke ([place]) 7 years ago   Lasting effects on balance, different personality.  . Thyroid disease   . Ventricular tachycardia ([place])    pt told at [facility] that she had RVOT VT   Past Surgical History:  Procedure Laterality Date  . ATRIAL FIBRILLATION ABLATION  10/18/12   PVI by Dr Rayann Heman  . ATRIAL FIBRILLATION ABLATION N/A 10/18/2012   Procedure: ATRIAL FIBRILLATION ABLATION;  Surgeon: Thompson Grayer, MD;  Location: [facility] CATH LAB;  Service: Cardiovascular;  Laterality: N/A;  . EYE SURGERY     on L/R eye, macular hole   . OPEN REDUCTION INTERNAL FIXATION (ORIF) DISTAL RADIAL FRACTURE Right 11/15/2014   Procedure: OPEN REDUCTION INTERNAL FIXATION (ORIF) RIGHT DISTAL RADIAL FRACTURE WITH ALLOGRAFT BONE GRAFT;  Surgeon: Roseanne Kaufman, MD;  Location: [place];  Service: Orthopedics;  Laterality: Right;  . TEE WITHOUT CARDIOVERSION N/A 10/18/2012   Procedure: TRANSESOPHAGEAL  ECHOCARDIOGRAM (TEE);  Surgeon: Lelon Perla, MD;  Location: [facility] ENDOSCOPY;  Service: Cardiovascular;  Laterality: N/A;  Pre-Ablation at 12pm  . TONSILLECTOMY    . VAGINAL HYSTERECTOMY       Current Outpatient Prescriptions  Medication Sig Dispense Refill  . acetaminophen (TYLENOL) 500 MG tablet Take 2 tablets by mouth every 6 hours as needed for pain    . calcium carbonate (OS-CAL) 600 MG TABS Take 600 mg by mouth daily with breakfast.     . cholecalciferol (VITAMIN D) 1000 UNITS tablet Take 2,000 Units by mouth daily.     Marland Kitchen denosumab (PROLIA) 60 MG/ML SOLN injection Inject 60 mg into the skin every 6 (six) months. Administer in upper arm, thigh, or abdomen    . dextroamphetamine (DEXTROSTAT) 5 MG tablet Take 5 mg by mouth daily.     Marland Kitchen diltiazem (CARDIZEM CD) 240 MG 24 hr capsule Take 1 capsule (240 mg total) by mouth daily. Pt takes once extra tablet daily as needed for AFIB 90 capsule 3  . DULoxetine (CYMBALTA) 30 MG capsule Take 1 capsule by mouth at bedtime. Reported on 01/15/2016    . DULoxetine (CYMBALTA) 60 MG capsule Take 60 mg by mouth every morning.     . flecainide (TAMBOCOR) 100 MG tablet TAKE 1 TABLET (100 MG TOTAL) BY MOUTH 2 (TWO) TIMES DAILY. 180 tablet 3  . levothyroxine (SYNTHROID, LEVOTHROID) 25 MCG tablet Take 1 tablet (25 mcg total) by mouth daily. Ruby  tablet 2  . Multiple Vitamin (MULTIVITAMIN) tablet Take 1 tablet by mouth daily.      . Oral Electrolytes (BUFFERED SALT PO) Take 1 capsule by mouth daily as needed (Electrolyte/Salt supplement).     . Timolol Maleate (TIMOPTIC OP) Place 1 drop into the left eye daily.     . vitamin B-12 (CYANOCOBALAMIN) 1000 MCG tablet Take 1,000 mcg by mouth daily.    . vitamin C (ASCORBIC ACID) 500 MG tablet Take 500 mg by mouth daily.    . vitamin E 400 UNIT capsule Take 400 Units by mouth daily.      Alveda Reasons 20 MG TABS tablet TAKE 1 TABLET BY MOUTH EVERY DAY 90 tablet 2   No current facility-administered medications for this  visit.     Allergies:   Darvon; Propoxyphene; Propoxyphene n-acetaminophen; and Levofloxacin   Social History:  The patient  reports that she has never smoked. She has never used smokeless tobacco. She reports that she drinks about 0.5 oz of alcohol per week . She reports that she does not use drugs.   Family History:  The patient's  family history includes Multiple sclerosis in her father.    ROS:  Please see the history of present illness.   All other systems are reviewed and negative.    PHYSICAL EXAM: VS:  BP (!) 106/56   Pulse 63   Ht 5' 8.5" (1.74 m)   Wt 177 lb (80.3 kg)   BMI 26.52 kg/m  , BMI Body mass index is 26.52 kg/m. GEN: Well nourished, well developed, in no acute distress  HEENT: normal  Neck: no JVD, carotid bruits, or masses Cardiac: RRR; no murmurs, rubs, or gallops,no edema  Respiratory:  clear to auscultation bilaterally, normal work of breathing GI: soft, nontender, nondistended, + BS MS: R wrist is in a splint Skin: warm and dry  Neuro:  Strength and sensation are intact Psych: euthymic mood, full affect  EKG:  EKG is ordered today. The ekg ordered today shows sinus rhythm    Recent Labs: 05/06/2016: ALT 16; BUN 21; Creatinine, Ser 0.63; Hemoglobin 13.3; Platelets 289.0; Potassium 4.4; Sodium 138; TSH 1.62    Lipid Panel     Component Value Date/Time   CHOL 218 (H) 05/06/2016 1412   TRIG 62.0 05/06/2016 1412   HDL 92.60 05/06/2016 1412   CHOLHDL 2 05/06/2016 1412   VLDL 12.4 05/06/2016 1412   LDLCALC 113 (H) 05/06/2016 1412   LDLDIRECT 123.5 09/18/2013 0844     Wt Readings from Last 3 Encounters:  08/12/16 177 lb (80.3 kg)  05/13/16 170 lb 8 oz (77.3 kg)  05/07/16 169 lb 3.2 oz (76.7 kg)     ASSESSMENT AND PLAN:  1. Persistent Afib, s/p PVI 10/08/12.  Doing well with flecainide and diltiazem Continue xarelto (Chads2vasc 4)  Follow-up with Butch Penny in 6 months per patient request. I will see in a year unless problems  arise   Current medicines are reviewed at length with the patient today.   The patient does not have concerns regarding her medicines.  The following changes were made today:  none  Signed, Thompson Grayer, MD    [place] [address] [phone] (office) [phone] (fax)

## 2016-08-12 NOTE — Telephone Encounter (Signed)
Two bottles of xarelto provided to patient today at her office visit.

## 2016-08-13 NOTE — Addendum Note (Signed)
Addended by: Marlis Edelson C on: 08/13/2016 12:35 PM   Modules accepted: Orders

## 2016-09-10 DIAGNOSIS — H40052 Ocular hypertension, left eye: Secondary | ICD-10-CM | POA: Diagnosis not present

## 2016-09-14 ENCOUNTER — Ambulatory Visit (INDEPENDENT_AMBULATORY_CARE_PROVIDER_SITE_OTHER): Payer: Medicare Other | Admitting: Family Medicine

## 2016-09-14 ENCOUNTER — Encounter: Payer: Self-pay | Admitting: Family Medicine

## 2016-09-14 VITALS — BP 128/70 | HR 62 | Temp 98.0°F | Wt 175.5 lb

## 2016-09-14 DIAGNOSIS — J069 Acute upper respiratory infection, unspecified: Secondary | ICD-10-CM | POA: Diagnosis not present

## 2016-09-14 DIAGNOSIS — B9789 Other viral agents as the cause of diseases classified elsewhere: Secondary | ICD-10-CM | POA: Diagnosis not present

## 2016-09-14 NOTE — Progress Notes (Signed)
SUBJECTIVE:  Monica Whitaker is a 71 y.o. female who complains of coryza, congestion, sneezing, sore throat and productive cough for 3 days. She denies a history of anorexia and chest pain and denies a history of asthma. Patient denies smoke cigarettes.   Current Outpatient Prescriptions on File Prior to Visit  Medication Sig Dispense Refill  . acetaminophen (TYLENOL) 500 MG tablet Take 2 tablets by mouth every 6 hours as needed for pain    . calcium carbonate (OS-CAL) 600 MG TABS Take 600 mg by mouth daily with breakfast.     . cholecalciferol (VITAMIN D) 1000 UNITS tablet Take 2,000 Units by mouth daily.     Marland Kitchen denosumab (PROLIA) 60 MG/ML SOLN injection Inject 60 mg into the skin every 6 (six) months. Administer in upper arm, thigh, or abdomen    . dextroamphetamine (DEXTROSTAT) 5 MG tablet Take 5 mg by mouth daily.     Marland Kitchen diltiazem (CARDIZEM CD) 240 MG 24 hr capsule Take 1 capsule (240 mg total) by mouth daily. Pt takes once extra tablet daily as needed for AFIB 90 capsule 3  . DULoxetine (CYMBALTA) 30 MG capsule Take 1 capsule by mouth at bedtime. Reported on 01/15/2016    . DULoxetine (CYMBALTA) 60 MG capsule Take 60 mg by mouth every morning.     . flecainide (TAMBOCOR) 100 MG tablet TAKE 1 TABLET (100 MG TOTAL) BY MOUTH 2 (TWO) TIMES DAILY. 180 tablet 3  . levothyroxine (SYNTHROID, LEVOTHROID) 25 MCG tablet Take 1 tablet (25 mcg total) by mouth daily. 90 tablet 2  . Multiple Vitamin (MULTIVITAMIN) tablet Take 1 tablet by mouth daily.      . Oral Electrolytes (BUFFERED SALT PO) Take 1 capsule by mouth daily as needed (Electrolyte/Salt supplement).     . rivaroxaban (XARELTO) 20 MG TABS tablet Take 1 tablet (20 mg total) by mouth daily. 90 tablet 2  . Timolol Maleate (TIMOPTIC OP) Place 1 drop into the left eye daily.     . vitamin B-12 (CYANOCOBALAMIN) 1000 MCG tablet Take 1,000 mcg by mouth daily.    . vitamin C (ASCORBIC ACID) 500 MG tablet Take 500 mg by mouth daily.    . vitamin E 400  UNIT capsule Take 400 Units by mouth daily.       No current facility-administered medications on file prior to visit.     Allergies  Allergen Reactions  . Darvon Other (See Comments)    Severe panic attacks  . Propoxyphene Other (See Comments)    GI Upset Severe panic attacks GI Upset  . Propoxyphene N-Acetaminophen Other (See Comments)    Severe panic attacks  . Levofloxacin Anxiety and Other (See Comments)    Causes panic attacks.    Past Medical History:  Diagnosis Date  . Anxiety    controlled with meds  . Atrial fibrillation ([name])    paroxysmal, failed medical therapy with flecainide,  NSVT with tikosyn  . Bruises easily   . CVA (cerebral vascular accident) ([place]) 12/2007  . Depression    medically controlled   . Low blood pressure    no falls, but if gets up to fast  . Stroke ([place]) 7 years ago   Lasting effects on balance, different personality.  . Thyroid disease   . Ventricular tachycardia ([place])    pt told at [facility] that she had RVOT VT    Past Surgical History:  Procedure Laterality Date  . ATRIAL FIBRILLATION ABLATION  10/18/12   PVI by Dr Rayann Heman  .  ATRIAL FIBRILLATION ABLATION N/A 10/18/2012   Procedure: ATRIAL FIBRILLATION ABLATION;  Surgeon: Thompson Grayer, MD;  Location: [facility] CATH LAB;  Service: Cardiovascular;  Laterality: N/A;  . EYE SURGERY     on L/R eye, macular hole   . OPEN REDUCTION INTERNAL FIXATION (ORIF) DISTAL RADIAL FRACTURE Right 11/15/2014   Procedure: OPEN REDUCTION INTERNAL FIXATION (ORIF) RIGHT DISTAL RADIAL FRACTURE WITH ALLOGRAFT BONE GRAFT;  Surgeon: Roseanne Kaufman, MD;  Location: [place];  Service: Orthopedics;  Laterality: Right;  . TEE WITHOUT CARDIOVERSION N/A 10/18/2012   Procedure: TRANSESOPHAGEAL ECHOCARDIOGRAM (TEE);  Surgeon: Lelon Perla, MD;  Location: [facility] ENDOSCOPY;  Service: Cardiovascular;  Laterality: N/A;  Pre-Ablation at 12pm  . TONSILLECTOMY    . VAGINAL HYSTERECTOMY      Family History  Problem  Relation Age of Onset  . Multiple sclerosis Father     Social History   Social History  . Marital status: Married    Spouse name: N/A  . Number of children: 0  . Years of education: N/A   Occupational History  . Retired Retired   Social History Main Topics  . Smoking status: Never Smoker  . Smokeless tobacco: Never Used  . Alcohol use 0.5 oz/week    1 Standard drinks or equivalent per week     Comment: regular  . Drug use: No  . Sexual activity: No   Other Topics Concern  . Not on file   Social History Narrative   Lives in [place] with her husband.  No children.  Former Psychologist, prison and probation services      Has a living will-    Would desire CPR   Would not want prolonged life support if futile.   The PMH, PSH, Social History, Family History, Medications, and allergies have been reviewed in [facility], and have been updated if relevant.  OBJECTIVE: BP 128/70   Pulse 62   Temp 98 F (36.7 C) (Oral)   Wt 175 lb 8 oz (79.6 kg)   SpO2 96%   BMI 26.30 kg/m   She appears well, vital signs are as noted. Ears normal.  Throat and pharynx normal.  Neck supple. No adenopathy in the neck. Nose is congested. Sinuses non tender. The chest is clear, without wheezes or rales.  ASSESSMENT:  viral upper respiratory illness  PLAN: Symptomatic therapy suggested: push fluids, rest and return office visit prn if symptoms persist or worsen. Lack of antibiotic effectiveness discussed with her. Call or return to clinic prn if these symptoms worsen or fail to improve as anticipated.

## 2016-09-14 NOTE — Progress Notes (Signed)
Pre visit review using our clinic review tool, if applicable. No additional management support is needed unless otherwise documented below in the visit note. 

## 2016-09-21 ENCOUNTER — Encounter: Payer: Self-pay | Admitting: Family Medicine

## 2016-09-24 ENCOUNTER — Telehealth: Payer: Self-pay

## 2016-09-24 NOTE — Telephone Encounter (Signed)
Pt left v/m; last prolia inj 04/01/16; pt request start process of cking approval of ins, ordering med,ck labs and schedule appt. Pt request cb.

## 2016-09-29 ENCOUNTER — Encounter: Payer: Self-pay | Admitting: Family Medicine

## 2016-09-30 ENCOUNTER — Other Ambulatory Visit: Payer: Self-pay | Admitting: Family Medicine

## 2016-09-30 MED ORDER — BENZONATATE 200 MG PO CAPS: 200.0000 mg | ORAL_CAPSULE | Freq: Two times a day (BID) | ORAL | 0 refills | Status: DC | PRN

## 2016-10-01 NOTE — Telephone Encounter (Signed)
Spoke to pt and advised she is due appx 3/3 (Feb being a short month and injection must be 44mo +1D) information submitted and awaiting response

## 2016-10-05 DIAGNOSIS — M5136 Other intervertebral disc degeneration, lumbar region: Secondary | ICD-10-CM | POA: Diagnosis not present

## 2016-10-09 ENCOUNTER — Telehealth: Payer: Self-pay

## 2016-10-09 MED ORDER — BENZONATATE 200 MG PO CAPS: 200.0000 mg | ORAL_CAPSULE | Freq: Two times a day (BID) | ORAL | 0 refills | Status: DC | PRN

## 2016-10-09 NOTE — Telephone Encounter (Signed)
eRx refill sent. 

## 2016-10-09 NOTE — Telephone Encounter (Signed)
Pt left v/m; pt seen 09/14/16 and pt was given benzonatate for the cough; pt continues with a cough and request refill benzonatate to [facility].

## 2016-10-26 ENCOUNTER — Encounter: Payer: Self-pay | Admitting: Family Medicine

## 2016-10-29 ENCOUNTER — Telehealth: Payer: Self-pay | Admitting: *Deleted

## 2016-10-29 NOTE — Telephone Encounter (Signed)
Patient returned call

## 2016-10-29 NOTE — Telephone Encounter (Signed)
Called pt and LVM message about approval for prolia injection and to please call back the

## 2016-10-29 NOTE — Telephone Encounter (Signed)
Verification of benefits have been processed and an approval has been received for pts prolia injection. Pts estimated cost are appx $215. This is only an estimate and cannot be confirmed until benefits are paid. Please advise pt and schedule if needed. If scheduled, once the injection is received, pls contact me back with the date it was received so that I am able to update prolia folder. thanks  [place] to schedule Ca lab if pt is agreeable and injection is due now.

## 2016-11-02 NOTE — Telephone Encounter (Signed)
Patient returned call.  Please call her back at [phone].

## 2016-11-03 ENCOUNTER — Encounter: Payer: Self-pay | Admitting: Family Medicine

## 2016-11-03 DIAGNOSIS — L570 Actinic keratosis: Secondary | ICD-10-CM | POA: Diagnosis not present

## 2016-11-03 DIAGNOSIS — D485 Neoplasm of uncertain behavior of skin: Secondary | ICD-10-CM | POA: Diagnosis not present

## 2016-11-03 NOTE — Telephone Encounter (Signed)
Called & Left VM on Both #'s for Return Call

## 2016-11-03 NOTE — Telephone Encounter (Signed)
Spoke with patient & informed that Calcium lab scheduled for 4/4 @ 9:00 am That it takes 24 hrs to get results  & then afterwards based on labs we could schedule for next Prolia injection. Patient stated that would be fine

## 2016-11-04 ENCOUNTER — Other Ambulatory Visit (INDEPENDENT_AMBULATORY_CARE_PROVIDER_SITE_OTHER): Payer: Medicare Other

## 2016-11-04 DIAGNOSIS — Z79899 Other long term (current) drug therapy: Secondary | ICD-10-CM

## 2016-11-04 LAB — BASIC METABOLIC PANEL
BUN: 23 mg/dL (ref 6–23)
CO2: 33 mEq/L — ABNORMAL HIGH (ref 19–32)
Calcium: 9.5 mg/dL (ref 8.4–10.5)
Chloride: 100 mEq/L (ref 96–112)
Creatinine, Ser: 0.67 mg/dL (ref 0.40–1.20)
GFR: 92.68 mL/min (ref >60.00)
Glucose, Bld: 82 mg/dL (ref 70–99)
Potassium: 4.2 mEq/L (ref 3.5–5.1)
Sodium: 138 mEq/L (ref 135–145)

## 2016-11-11 ENCOUNTER — Encounter: Payer: Self-pay | Admitting: Family Medicine

## 2016-11-17 ENCOUNTER — Telehealth: Payer: Self-pay | Admitting: Family Medicine

## 2016-11-17 NOTE — Telephone Encounter (Signed)
Calcium was normal.  Ok to schedule.

## 2016-11-17 NOTE — Telephone Encounter (Signed)
Pt called about calcium labs that were done on 04/04 and when to schedule prolia.  Pt request cb at [phone]

## 2016-11-18 ENCOUNTER — Encounter: Payer: Self-pay | Admitting: Family Medicine

## 2016-11-19 ENCOUNTER — Encounter: Payer: Self-pay | Admitting: Family Medicine

## 2016-11-19 ENCOUNTER — Telehealth (HOSPITAL_COMMUNITY): Payer: Self-pay | Admitting: *Deleted

## 2016-11-19 ENCOUNTER — Ambulatory Visit (INDEPENDENT_AMBULATORY_CARE_PROVIDER_SITE_OTHER): Payer: Medicare Other | Admitting: Family Medicine

## 2016-11-19 VITALS — BP 102/80 | HR 62 | Temp 98.0°F | Wt 173.0 lb

## 2016-11-19 DIAGNOSIS — R202 Paresthesia of skin: Secondary | ICD-10-CM | POA: Diagnosis not present

## 2016-11-19 DIAGNOSIS — M810 Age-related osteoporosis without current pathological fracture: Secondary | ICD-10-CM

## 2016-11-19 DIAGNOSIS — T07XXXA Unspecified multiple injuries, initial encounter: Secondary | ICD-10-CM | POA: Diagnosis not present

## 2016-11-19 DIAGNOSIS — M8000XP Age-related osteoporosis with current pathological fracture, unspecified site, subsequent encounter for fracture with malunion: Secondary | ICD-10-CM

## 2016-11-19 LAB — CBC WITH DIFFERENTIAL/PLATELET
BASOS ABS: 0 10*3/uL (ref 0.0–0.1)
Basophils Relative: 0.8 % (ref 0.0–3.0)
EOS ABS: 0.2 10*3/uL (ref 0.0–0.7)
Eosinophils Relative: 4.3 % (ref 0.0–5.0)
HCT: 39.5 % (ref 36.0–46.0)
HEMOGLOBIN: 13.2 g/dL (ref 12.0–15.0)
Lymphocytes Relative: 37.8 % (ref 12.0–46.0)
Lymphs Abs: 1.7 10*3/uL (ref 0.7–4.0)
MCHC: 33.4 g/dL (ref 30.0–36.0)
MCV: 92.5 fl (ref 78.0–100.0)
MONO ABS: 0.6 10*3/uL (ref 0.1–1.0)
Monocytes Relative: 12.8 % — ABNORMAL HIGH (ref 3.0–12.0)
Neutro Abs: 2 10*3/uL (ref 1.4–7.7)
Neutrophils Relative %: 44.3 % (ref 43.0–77.0)
Platelets: 283 10*3/uL (ref 150.0–400.0)
RBC: 4.27 Mil/uL (ref 3.87–5.11)
RDW: 13.6 % (ref 11.5–15.5)
WBC: 4.5 10*3/uL (ref 4.0–10.5)

## 2016-11-19 LAB — COMPREHENSIVE METABOLIC PANEL
ALBUMIN: 4.1 g/dL (ref 3.5–5.2)
ALT: 18 U/L (ref 0–35)
AST: 22 U/L (ref 0–37)
Alkaline Phosphatase: 54 U/L (ref 39–117)
BUN: 19 mg/dL (ref 6–23)
CHLORIDE: 100 meq/L (ref 96–112)
CO2: 32 mEq/L (ref 19–32)
CREATININE: 0.66 mg/dL (ref 0.40–1.20)
Calcium: 9.3 mg/dL (ref 8.4–10.5)
GFR: 94.29 mL/min (ref >60.00)
Glucose, Bld: 90 mg/dL (ref 70–99)
Potassium: 4.4 mEq/L (ref 3.5–5.1)
SODIUM: 137 meq/L (ref 135–145)
Total Bilirubin: 0.7 mg/dL (ref 0.2–1.2)
Total Protein: 6.9 g/dL (ref 6.0–8.3)

## 2016-11-19 LAB — VITAMIN B12: Vitamin B-12: 794 pg/mL (ref 211–911)

## 2016-11-19 LAB — TSH: TSH: 1.71 u[IU]/mL (ref 0.35–4.50)

## 2016-11-19 LAB — VITAMIN D 25 HYDROXY (VIT D DEFICIENCY, FRACTURES): VITD: 41.68 ng/mL (ref 30.00–100.00)

## 2016-11-19 MED ORDER — DENOSUMAB 60 MG/ML ~~LOC~~ SOLN: 60.0000 mg | SUBCUTANEOUS | 0 refills | Status: DC

## 2016-11-19 MED ORDER — DENOSUMAB 60 MG/ML ~~LOC~~ SOLN
60.0000 mg | Freq: Once | SUBCUTANEOUS | Status: AC
  Administered 2016-11-19: 60 mg via SUBCUTANEOUS

## 2016-11-19 NOTE — Patient Instructions (Signed)
Great to see you. I will call you with your lab results and you can view them online.

## 2016-11-19 NOTE — Assessment & Plan Note (Signed)
New- exam reassuring. Discussed with pt- initial work up today- blood work to rule out reversible causes of paresthesias.  If labs unremarkable, may need to proceed to imaging/Neuro. The patient indicates understanding of these issues and agrees with the plan. Orders Placed This Encounter  Procedures  . Vitamin B12  . Vitamin D, 25-hydroxy  . TSH  . CBC with Differential/Platelet  . Comprehensive metabolic panel

## 2016-11-19 NOTE — Progress Notes (Signed)
Subjective:   Patient ID: Monica Whitaker, female    DOB: 11-14-1945, 71 y.o.   MRN: [id number]  Monica Whitaker is a pleasant 71 y.o. year old female who presents to clinic today with Numbness (Numbness in feet for several weeks.)  on 11/19/2016  HPI:  Numbness in her feet, bottom, mainly toward front of her feet for two weeks.  No LE weakness.  No changes in her foot wear.    Very active.  Has been a little more tired but her husband's health has not been good so she attributed the fatigue to that stressor.  No other complaints today.  Lab Results  Component Value Date   [id number] 08/24/2011   Osteoporosis- with h/o multiple fractures,  due for prolia today. Current Outpatient Prescriptions on File Prior to Visit  Medication Sig Dispense Refill  . acetaminophen (TYLENOL) 500 MG tablet Take 2 tablets by mouth every 6 hours as needed for pain    . calcium carbonate (OS-CAL) 600 MG TABS Take 600 mg by mouth daily with breakfast.     . cholecalciferol (VITAMIN D) 1000 UNITS tablet Take 2,000 Units by mouth daily.     Marland Kitchen dextroamphetamine (DEXTROSTAT) 5 MG tablet Take 5 mg by mouth daily.     Marland Kitchen diltiazem (CARDIZEM CD) 240 MG 24 hr capsule Take 1 capsule (240 mg total) by mouth daily. Pt takes once extra tablet daily as needed for AFIB 90 capsule 3  . DULoxetine (CYMBALTA) 30 MG capsule Take 1 capsule by mouth at bedtime. Reported on 01/15/2016    . DULoxetine (CYMBALTA) 60 MG capsule Take 60 mg by mouth every morning.     . flecainide (TAMBOCOR) 100 MG tablet TAKE 1 TABLET (100 MG TOTAL) BY MOUTH 2 (TWO) TIMES DAILY. 180 tablet 3  . levothyroxine (SYNTHROID, LEVOTHROID) 25 MCG tablet Take 1 tablet (25 mcg total) by mouth daily. 90 tablet 2  . Multiple Vitamin (MULTIVITAMIN) tablet Take 1 tablet by mouth daily.      . Oral Electrolytes (BUFFERED SALT PO) Take 1 capsule by mouth daily as needed (Electrolyte/Salt supplement).     . rivaroxaban (XARELTO) 20 MG TABS tablet Take 1  tablet (20 mg total) by mouth daily. 90 tablet 2  . Timolol Maleate (TIMOPTIC OP) Place 1 drop into the left eye daily.     . vitamin B-12 (CYANOCOBALAMIN) 1000 MCG tablet Take 1,000 mcg by mouth daily.    . vitamin C (ASCORBIC ACID) 500 MG tablet Take 500 mg by mouth daily.    . vitamin E 400 UNIT capsule Take 400 Units by mouth daily.      . benzonatate (TESSALON) 200 MG capsule Take 1 capsule (200 mg total) by mouth 2 (two) times daily as needed for cough. (Patient not taking: Reported on 11/19/2016) 20 capsule 0   No current facility-administered medications on file prior to visit.     Allergies  Allergen Reactions  . Darvon Other (See Comments)    Severe panic attacks  . Propoxyphene Other (See Comments)    GI Upset Severe panic attacks GI Upset  . Propoxyphene N-Acetaminophen Other (See Comments)    Severe panic attacks  . Levofloxacin Anxiety and Other (See Comments)    Causes panic attacks.    Past Medical History:  Diagnosis Date  . Anxiety    controlled with meds  . Atrial fibrillation ([name])    paroxysmal, failed medical therapy with flecainide,  NSVT with tikosyn  . Bruises easily   .  CVA (cerebral vascular accident) ([place]) 12/2007  . Depression    medically controlled   . Low blood pressure    no falls, but if gets up to fast  . Stroke ([place]) 7 years ago   Lasting effects on balance, different personality.  . Thyroid disease   . Ventricular tachycardia ([place])    pt told at [facility] that she had RVOT VT    Past Surgical History:  Procedure Laterality Date  . ATRIAL FIBRILLATION ABLATION  10/18/12   PVI by Dr Rayann Heman  . ATRIAL FIBRILLATION ABLATION N/A 10/18/2012   Procedure: ATRIAL FIBRILLATION ABLATION;  Surgeon: Thompson Grayer, MD;  Location: [facility] CATH LAB;  Service: Cardiovascular;  Laterality: N/A;  . EYE SURGERY     on L/R eye, macular hole   . OPEN REDUCTION INTERNAL FIXATION (ORIF) DISTAL RADIAL FRACTURE Right 11/15/2014   Procedure: OPEN REDUCTION INTERNAL  FIXATION (ORIF) RIGHT DISTAL RADIAL FRACTURE WITH ALLOGRAFT BONE GRAFT;  Surgeon: Roseanne Kaufman, MD;  Location: [place];  Service: Orthopedics;  Laterality: Right;  . TEE WITHOUT CARDIOVERSION N/A 10/18/2012   Procedure: TRANSESOPHAGEAL ECHOCARDIOGRAM (TEE);  Surgeon: Lelon Perla, MD;  Location[facility] ENDOSCOPY;  Service: Cardiovascular;  Laterality: N/A;  Pre-Ablation at 12pm  . TONSILLECTOMY    . VAGINAL HYSTERECTOMY      Family History  Problem Relation Age of Onset  . Multiple sclerosis Father     Social History   Social History  . Marital status: Married    Spouse name: N/A  . Number of children: 0  . Years of education: N/A   Occupational History  . Retired Retired   Social History Main Topics  . Smoking status: Never Smoker  . Smokeless tobacco: Never Used  . Alcohol use 0.5 oz/week    1 Standard drinks or equivalent per week     Comment: regular  . Drug use: No  . Sexual activity: No   Other Topics Concern  . Not on file   Social History Narrative   Lives in [place] with her husband.  No children.  Former Psychologist, prison and probation services      Has a living will-    Would desire CPR   Would not want prolonged life support if futile.   The PMH, PSH, Social History, Family History, Medications, and allergies have been reviewed in [facility], and have been updated if relevant.   Review of Systems  Constitutional: Positive for fatigue.  Neurological: Positive for numbness. Negative for dizziness, tremors, seizures, syncope, facial asymmetry, speech difficulty, weakness, light-headedness and headaches.  All other systems reviewed and are negative.      Objective:    BP 102/80 (BP Location: Right Arm, Patient Position: Sitting, Cuff Size: Normal)   Pulse 62   Temp 98 F (36.7 C) (Oral)   Wt 173 lb (78.5 kg)   SpO2 97%   BMI 25.92 kg/m    Physical Exam  Constitutional: She appears well-developed and well-nourished. No distress.  HENT:  Head:  Normocephalic and atraumatic.  Eyes: Conjunctivae are normal.  Cardiovascular: Normal rate.   Pulmonary/Chest: Effort normal.  Neurological: She is alert. No cranial nerve deficit or sensory deficit.  Skin: Skin is dry. She is not diaphoretic.  Psychiatric: She has a normal mood and affect. Her behavior is normal. Judgment and thought content normal.          Assessment & Plan:   Osteoporosis, postmenopausal - Plan: denosumab (PROLIA) injection 60 mg, DISCONTINUED: denosumab (PROLIA) 60 MG/ML SOLN  injection  Paresthesia of both lower extremities - Plan: Vitamin B12, Vitamin D, 25-hydroxy, TSH, CBC with Differential/Platelet, Comprehensive metabolic panel No Follow-up on file.

## 2016-11-19 NOTE — Assessment & Plan Note (Signed)
IM prolia given today.

## 2016-11-19 NOTE — Telephone Encounter (Signed)
Patient called in stating she went into afib about 3 hours ago. Unable to take HR states it reads error message. HR and BP were normal at physician office visit earlier today. When she went into afib she took an extra cardizem at noon. Instructed pt to go ahead and take evening dose of flecainide now and will call back at 4pm with update of how she feeling for instructions in regards to tonight. Tried to reassure patient as she was very anxious over the phone. Pt agreed with plan

## 2016-11-19 NOTE — Progress Notes (Signed)
Pre visit review using our clinic review tool, if applicable. No additional management support is needed unless otherwise documented below in the visit note. 

## 2016-11-19 NOTE — Telephone Encounter (Signed)
Pt received prolia injection at 4/19 OV

## 2016-11-19 NOTE — Telephone Encounter (Signed)
Spoke to pt while at [place]. CA lab completed 4/4 but was never resulted. Advised pt labs are normal. Pt will receive injection today. Prolia book updated to reflect injection being received

## 2016-11-20 ENCOUNTER — Telehealth (HOSPITAL_COMMUNITY): Payer: Self-pay | Admitting: *Deleted

## 2016-11-20 ENCOUNTER — Encounter (INDEPENDENT_AMBULATORY_CARE_PROVIDER_SITE_OTHER): Payer: Self-pay

## 2016-11-20 MED ORDER — DILTIAZEM HCL 30 MG PO TABS: ORAL_TABLET | ORAL | 1 refills | Status: DC

## 2016-11-20 NOTE — Telephone Encounter (Signed)
Patient called in this morning stating she returned to NSR around midnight. Feeling much better today other than fatigued. Will call in 30mg  PRN cardizem to take if return of afib since had significant BP drop with taking extra 240mg  of cardizem prior to calling yesterday. Pt in agreement with this plan.

## 2016-11-25 ENCOUNTER — Encounter: Payer: Self-pay | Admitting: Family Medicine

## 2016-12-02 ENCOUNTER — Encounter: Payer: Self-pay | Admitting: Family Medicine

## 2016-12-14 ENCOUNTER — Telehealth: Payer: Self-pay | Admitting: Internal Medicine

## 2016-12-14 NOTE — Telephone Encounter (Signed)
New message    Pt is calling to talk about switching her medication due to the cost. She is asking for RN to return her call.

## 2016-12-14 NOTE — Telephone Encounter (Signed)
Returned call to patient-patient reports xarelto is costing $400 monthly and would like to make the change to eliquis which her husband is on. States she is in touch with her insurance company and is going to see how much eliquis would cost and will call office back to let us know and we can see what her options are.

## 2016-12-18 ENCOUNTER — Other Ambulatory Visit: Payer: Self-pay | Admitting: Family Medicine

## 2016-12-18 ENCOUNTER — Other Ambulatory Visit: Payer: Self-pay | Admitting: *Deleted

## 2016-12-18 MED ORDER — RIVAROXABAN 20 MG PO TABS: 20.0000 mg | ORAL_TABLET | Freq: Every day | ORAL | 2 refills | Status: DC

## 2016-12-18 NOTE — Telephone Encounter (Signed)
Xarelto refill request received; pt is 71 yrs old, 78.5kg, Cr-0.66 on 11/19/16, CrCl-99.37ml/min & last seen by Dr. Rayann Heman on 08/12/16. Will send in refill request to requested pharmacy.

## 2016-12-23 ENCOUNTER — Encounter: Payer: Self-pay | Admitting: Internal Medicine

## 2016-12-24 ENCOUNTER — Telehealth: Payer: Self-pay | Admitting: *Deleted

## 2016-12-24 ENCOUNTER — Other Ambulatory Visit: Payer: Self-pay | Admitting: Pharmacist

## 2016-12-24 MED ORDER — APIXABAN 5 MG PO TABS: 5.0000 mg | ORAL_TABLET | Freq: Two times a day (BID) | ORAL | 1 refills | Status: DC

## 2016-12-24 NOTE — Telephone Encounter (Signed)
This note was routed to me earlier today as well - I have already sent in a refill for Eliquis 5mg  BID to replace pt's Xarelto.

## 2016-12-24 NOTE — Telephone Encounter (Signed)
I called and spoke to Monica Whitaker, please see phone note. Lovett Sox, RN

## 2016-12-24 NOTE — Telephone Encounter (Signed)
See prior notes and emails regarding patients concerns for cost of xarelto. Patient is in donut hole and cost of xarelto is $400.00 month, she has tried patient assistance but with her and her husbands income she was denied.  She states that her insurance co pays for eliquis are $135.00 for 90 days. She would like to be switched to this medication.  Note sent to Dr Rayann Heman and Janan Halter, RN

## 2017-01-18 ENCOUNTER — Other Ambulatory Visit: Payer: Self-pay | Admitting: Internal Medicine

## 2017-02-08 ENCOUNTER — Ambulatory Visit (INDEPENDENT_AMBULATORY_CARE_PROVIDER_SITE_OTHER): Payer: Medicare Other | Admitting: Family Medicine

## 2017-02-08 ENCOUNTER — Encounter: Payer: Self-pay | Admitting: Family Medicine

## 2017-02-08 VITALS — BP 108/68 | HR 69 | Temp 98.6°F | Ht 68.5 in | Wt 178.0 lb

## 2017-02-08 DIAGNOSIS — R8290 Unspecified abnormal findings in urine: Secondary | ICD-10-CM

## 2017-02-08 DIAGNOSIS — R509 Fever, unspecified: Secondary | ICD-10-CM | POA: Insufficient documentation

## 2017-02-08 LAB — POC URINALSYSI DIPSTICK (AUTOMATED)
Bilirubin, UA: NEGATIVE
Blood, UA: NEGATIVE
Glucose, UA: NEGATIVE
KETONES UA: NEGATIVE
Nitrite, UA: NEGATIVE
PH UA: 6 (ref 5.0–8.0)
PROTEIN UA: NEGATIVE
Spec Grav, UA: 1.02 (ref 1.010–1.025)
UROBILINOGEN UA: 0.2 U/dL

## 2017-02-08 MED ORDER — NITROFURANTOIN MONOHYD MACRO 100 MG PO CAPS: 100.0000 mg | ORAL_CAPSULE | Freq: Two times a day (BID) | ORAL | 0 refills | Status: DC

## 2017-02-08 NOTE — Progress Notes (Signed)
Subjective:   Patient ID: Monica Whitaker, female    DOB: 02/26/1946, 71 y.o.   MRN: [id number]  Monica Whitaker is a pleasant 71 y.o. year old female who presents to clinic today with Acute Visit (low grade fever)  on 02/08/2017  HPI:  Intermittent "low grade fever" for 9 days.   Tmax 99- which is "high for me." Having chills and body aches as well.  No other symptoms. No cough, congestion, dysuria, nausea, abdominal pain or changes in her bowel habits.  Current Outpatient Prescriptions on File Prior to Visit  Medication Sig Dispense Refill  . acetaminophen (TYLENOL) 500 MG tablet Take 2 tablets by mouth every 6 hours as needed for pain    . apixaban (ELIQUIS) 5 MG TABS tablet Take 1 tablet (5 mg total) by mouth 2 (two) times daily. 180 tablet 1  . benzonatate (TESSALON) 200 MG capsule Take 1 capsule (200 mg total) by mouth 2 (two) times daily as needed for cough. 20 capsule 0  . calcium carbonate (OS-CAL) 600 MG TABS Take 600 mg by mouth daily with breakfast.     . cholecalciferol (VITAMIN D) 1000 UNITS tablet Take 2,000 Units by mouth daily.     Marland Kitchen dextroamphetamine (DEXTROSTAT) 5 MG tablet Take 5 mg by mouth daily.     Marland Kitchen diltiazem (CARDIZEM CD) 240 MG 24 hr capsule Take 1 capsule (240 mg total) by mouth daily. Pt takes once extra tablet daily as needed for AFIB 90 capsule 3  . diltiazem (CARDIZEM) 30 MG tablet Ttake 1 tablet every 4 hours AS NEEDED for rapid afib heart rate over 100 45 tablet 1  . flecainide (TAMBOCOR) 100 MG tablet Take 1 tablet (100 mg total) by mouth 2 (two) times daily. 180 tablet 1  . levothyroxine (SYNTHROID, LEVOTHROID) 25 MCG tablet TAKE 1 TABLET BY MOUTH EVERY DAY 90 tablet 3  . Multiple Vitamin (MULTIVITAMIN) tablet Take 1 tablet by mouth daily.      . Oral Electrolytes (BUFFERED SALT PO) Take 1 capsule by mouth daily as needed (Electrolyte/Salt supplement).     . Timolol Maleate (TIMOPTIC OP) Place 1 drop into the left eye daily.     . vitamin B-12  (CYANOCOBALAMIN) 1000 MCG tablet Take 1,000 mcg by mouth daily.    . vitamin C (ASCORBIC ACID) 500 MG tablet Take 500 mg by mouth daily.    . vitamin E 400 UNIT capsule Take 400 Units by mouth daily.       No current facility-administered medications on file prior to visit.     Allergies  Allergen Reactions  . Darvon Other (See Comments)    Severe panic attacks  . Propoxyphene Other (See Comments)    GI Upset Severe panic attacks GI Upset  . Propoxyphene N-Acetaminophen Other (See Comments)    Severe panic attacks  . Levofloxacin Anxiety and Other (See Comments)    Causes panic attacks.    Past Medical History:  Diagnosis Date  . Anxiety    controlled with meds  . Atrial fibrillation ([name])    paroxysmal, failed medical therapy with flecainide,  NSVT with tikosyn  . Bruises easily   . CVA (cerebral vascular accident) ([place]) 12/2007  . Depression    medically controlled   . Low blood pressure    no falls, but if gets up to fast  . Stroke ([place]) 7 years ago   Lasting effects on balance, different personality.  . Thyroid disease   . 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 ATRIAL FIBRILLATION ABLATION  10/18/12   PVI by Dr Rayann Heman  . ATRIAL FIBRILLATION ABLATION N/A 10/18/2012   Procedure: ATRIAL FIBRILLATION ABLATION;  Surgeon: Thompson Grayer, MD;  Location: [facility] CATH LAB;  Service: Cardiovascular;  Laterality: N/A;  . EYE SURGERY     on L/R eye, macular hole   . OPEN REDUCTION INTERNAL FIXATION (ORIF) DISTAL RADIAL FRACTURE Right 11/15/2014   Procedure: OPEN REDUCTION INTERNAL FIXATION (ORIF) RIGHT DISTAL RADIAL FRACTURE WITH ALLOGRAFT BONE GRAFT;  Surgeon: Roseanne Kaufman, MD;  Location: [place];  Service: Orthopedics;  Laterality: Right;  . TEE WITHOUT CARDIOVERSION N/A 10/18/2012   Procedure: TRANSESOPHAGEAL ECHOCARDIOGRAM (TEE);  Surgeon: Lelon Perla, MD;  Location: [facility]  ENDOSCOPY;  Service: Cardiovascular;  Laterality: N/A;  Pre-Ablation at 12pm  . TONSILLECTOMY    . VAGINAL HYSTERECTOMY      Family History  Problem Relation Age of Onset  . Multiple sclerosis Father     Social History   Social History  . Marital status: Married    Spouse name: N/A  . Number of children: 0  . Years of education: N/A   Occupational History  . Retired Retired   Social History Main Topics  . Smoking status: Never Smoker  . Smokeless tobacco: Never Used  . Alcohol use 0.5 oz/week    1 Standard drinks or equivalent per week     Comment: regular  . Drug use: No  . Sexual activity: No   Other Topics Concern  . Not on file   Social History Narrative   Lives in [place] with her husband.  No children.  Former Psychologist, prison and probation services      Has a living will-    Would desire CPR   Would not want prolonged life support if futile.   The PMH, PSH, Social History, Family History, Medications, and allergies have been reviewed in [facility], and have been updated if relevant.   Review of Systems  Constitutional: Positive for chills, fatigue and fever.  HENT: Negative.   Respiratory: Negative.   Cardiovascular: Negative.   Gastrointestinal: Negative.   Genitourinary: Negative.   Musculoskeletal: Positive for myalgias.  All other systems reviewed and are negative.      Objective:    BP 108/68 (BP Location: Left Arm, Patient Position: Sitting, Cuff Size: Normal)   Pulse 69   Temp 98.6 F (37 C) (Oral)   Ht 5' 8.5" (1.74 m)   Wt 178 lb (80.7 kg)   SpO2 94%   BMI 26.67 kg/m    Physical Exam    General:  Well-developed,well-nourished,in no acute distress; alert,appropriate and cooperative throughout examination Head:  normocephalic and atraumatic.   Eyes:  vision grossly intact, PERRL Ears:  R ear normal and L ear normal externally, TMs clear bilaterally Nose:  no external deformity.   Mouth:  good dentition.   Neck:  No deformities, masses, or tenderness  noted. Lungs:  Normal respiratory effort, chest expands symmetrically. Lungs are clear to auscultation, no crackles or wheezes. Heart:  Normal rate and regular rhythm. S1 and S2 normal without gallop, murmur, click, rub or other extra sounds. Abdomen:  Bowel sounds positive,abdomen soft and non-tender without masses, organomegaly or hernias noted. Extremities:  No clubbing, cyanosis, edema, or deformity noted with normal full range of motion of all joints.   Neurologic:  alert & oriented X3 and gait normal.  Skin:  Intact without suspicious lesions or rashes Cervical Nodes:  No lymphadenopathy noted Psych:  Cognition and judgment appear intact. Alert and cooperative with normal attention span and concentration. No apparent delusions, illusions, hallucinations      Assessment & Plan:   Fever and chills No Follow-up on file.

## 2017-02-08 NOTE — Addendum Note (Signed)
Addended by: Ellamae Sia on: 02/08/2017 04:44 PM   Modules accepted: Orders

## 2017-02-08 NOTE — Assessment & Plan Note (Signed)
Afebrile here.  UA pos for LE- will start on macrobid, send urine for cx. The patient indicates understanding of these issues and agrees with the plan.

## 2017-02-08 NOTE — Progress Notes (Signed)
Subjective:   Patient ID: Monica Whitaker, female    DOB: November 23, 1945, 71 y.o.   MRN: [id number]  Monica Whitaker is a pleasant 71 y.o. year old female who presents to clinic today with Acute Visit (low grade fever)  on 02/08/2017  HPI:  1 week of intermittent "low grade temp" of 99 which is "high for her."  Tired.  No other symptoms.  No dysuria, cough, chills.  Her cat did scratch her last week but it was superficial and she has recovered from that.  No erythema or rashes.  No headache, nausea or vomiting.  Current Outpatient Prescriptions on File Prior to Visit  Medication Sig Dispense Refill  . acetaminophen (TYLENOL) 500 MG tablet Take 2 tablets by mouth every 6 hours as needed for pain    . apixaban (ELIQUIS) 5 MG TABS tablet Take 1 tablet (5 mg total) by mouth 2 (two) times daily. 180 tablet 1  . benzonatate (TESSALON) 200 MG capsule Take 1 capsule (200 mg total) by mouth 2 (two) times daily as needed for cough. 20 capsule 0  . calcium carbonate (OS-CAL) 600 MG TABS Take 600 mg by mouth daily with breakfast.     . cholecalciferol (VITAMIN D) 1000 UNITS tablet Take 2,000 Units by mouth daily.     Marland Kitchen dextroamphetamine (DEXTROSTAT) 5 MG tablet Take 5 mg by mouth daily.     Marland Kitchen diltiazem (CARDIZEM CD) 240 MG 24 hr capsule Take 1 capsule (240 mg total) by mouth daily. Pt takes once extra tablet daily as needed for AFIB 90 capsule 3  . diltiazem (CARDIZEM) 30 MG tablet Ttake 1 tablet every 4 hours AS NEEDED for rapid afib heart rate over 100 45 tablet 1  . flecainide (TAMBOCOR) 100 MG tablet Take 1 tablet (100 mg total) by mouth 2 (two) times daily. 180 tablet 1  . levothyroxine (SYNTHROID, LEVOTHROID) 25 MCG tablet TAKE 1 TABLET BY MOUTH EVERY DAY 90 tablet 3  . Multiple Vitamin (MULTIVITAMIN) tablet Take 1 tablet by mouth daily.      . Oral Electrolytes (BUFFERED SALT PO) Take 1 capsule by mouth daily as needed (Electrolyte/Salt supplement).     . Timolol Maleate (TIMOPTIC OP) Place  1 drop into the left eye daily.     . vitamin B-12 (CYANOCOBALAMIN) 1000 MCG tablet Take 1,000 mcg by mouth daily.    . vitamin C (ASCORBIC ACID) 500 MG tablet Take 500 mg by mouth daily.    . vitamin E 400 UNIT capsule Take 400 Units by mouth daily.       No current facility-administered medications on file prior to visit.     Allergies  Allergen Reactions  . Darvon Other (See Comments)    Severe panic attacks  . Propoxyphene Other (See Comments)    GI Upset Severe panic attacks GI Upset  . Propoxyphene N-Acetaminophen Other (See Comments)    Severe panic attacks  . Levofloxacin Anxiety and Other (See Comments)    Causes panic attacks.    Past Medical History:  Diagnosis Date  . Anxiety    controlled with meds  . Atrial fibrillation ([name])    paroxysmal, failed medical therapy with flecainide,  NSVT with tikosyn  . Bruises easily   . CVA (cerebral vascular accident) ([place]) 12/2007  . Depression    medically controlled   . Low blood pressure    no falls, but if gets up to fast  . Stroke ([place]) 7 years ago   Lasting effects  on balance, different personality.  . Thyroid disease   . Ventricular tachycardia ([place])    [facility] told at [facility] that she had RVOT VT    Past Surgical History:  Procedure Laterality Date  . ATRIAL FIBRILLATION ABLATION  10/18/12   PVI by Dr Rayann Heman  . ATRIAL FIBRILLATION ABLATION N/A 10/18/2012   Procedure: ATRIAL FIBRILLATION ABLATION;  Surgeon: Thompson Grayer, MD;  Location: [facility] CATH LAB;  Service: Cardiovascular;  Laterality: N/A;  . EYE SURGERY     on L/R eye, macular hole   . OPEN REDUCTION INTERNAL FIXATION (ORIF) DISTAL RADIAL FRACTURE Right 11/15/2014   Procedure: OPEN REDUCTION INTERNAL FIXATION (ORIF) RIGHT DISTAL RADIAL FRACTURE WITH ALLOGRAFT BONE GRAFT;  Surgeon: Roseanne Kaufman, MD;  Location: [place];  Service: Orthopedics;  Laterality: Right;  . TEE WITHOUT CARDIOVERSION N/A 10/18/2012   Procedure: TRANSESOPHAGEAL ECHOCARDIOGRAM  (TEE);  Surgeon: Lelon Perla, MD;  Location: [facility] ENDOSCOPY;  Service: Cardiovascular;  Laterality: N/A;  Pre-Ablation at 12pm  . TONSILLECTOMY    . VAGINAL HYSTERECTOMY      Family History  Problem Relation Age of Onset  . Multiple sclerosis Father     Social History   Social History  . Marital status: Married    Spouse name: N/A  . Number of children: 0  . Years of education: N/A   Occupational History  . Retired Retired   Social History Main Topics  . Smoking status: Never Smoker  . Smokeless tobacco: Never Used  . Alcohol use 0.5 oz/week    1 Standard drinks or equivalent per week     Comment: regular  . Drug use: No  . Sexual activity: No   Other Topics Concern  . Not on file   Social History Narrative   Lives in [place] with her husband.  No children.  Former Psychologist, prison and probation services      Has a living will-    Would desire CPR   Would not want prolonged life support if futile.   The PMH, PSH, Social History, Family History, Medications, and allergies have been reviewed in [facility], and have been updated if relevant.   Review of Systems     Objective:    BP 108/68 (BP Location: Left Arm, Patient Position: Sitting, Cuff Size: Normal)   Pulse 69   Temp 98.6 F (37 C) (Oral)   Ht 5' 8.5" (1.74 m)   Wt 178 lb (80.7 kg)   SpO2 94%   BMI 26.67 kg/m    Physical Exam        Assessment & Plan:   No diagnosis found. No Follow-up on file.

## 2017-02-09 ENCOUNTER — Ambulatory Visit (HOSPITAL_COMMUNITY)
Admission: RE | Admit: 2017-02-09 | Discharge: 2017-02-09 | Disposition: A | Discharge status: Home / Self Care | Payer: Medicare Other | Source: Ambulatory Visit | Attending: Nurse Practitioner | Admitting: Nurse Practitioner

## 2017-02-09 ENCOUNTER — Encounter (HOSPITAL_COMMUNITY): Payer: Self-pay | Admitting: Nurse Practitioner

## 2017-02-09 VITALS — BP 110/72 | HR 61 | Ht 68.5 in | Wt 176.8 lb

## 2017-02-09 DIAGNOSIS — R600 Localized edema: Secondary | ICD-10-CM | POA: Insufficient documentation

## 2017-02-09 DIAGNOSIS — E079 Disorder of thyroid, unspecified: Secondary | ICD-10-CM | POA: Diagnosis not present

## 2017-02-09 DIAGNOSIS — Z79899 Other long term (current) drug therapy: Secondary | ICD-10-CM | POA: Diagnosis not present

## 2017-02-09 DIAGNOSIS — Z8673 Personal history of transient ischemic attack (TIA), and cerebral infarction without residual deficits: Secondary | ICD-10-CM | POA: Diagnosis not present

## 2017-02-09 DIAGNOSIS — Z7901 Long term (current) use of anticoagulants: Secondary | ICD-10-CM | POA: Insufficient documentation

## 2017-02-09 DIAGNOSIS — I480 Paroxysmal atrial fibrillation: Secondary | ICD-10-CM | POA: Diagnosis not present

## 2017-02-09 DIAGNOSIS — F329 Major depressive disorder, single episode, unspecified: Secondary | ICD-10-CM | POA: Insufficient documentation

## 2017-02-09 DIAGNOSIS — F419 Anxiety disorder, unspecified: Secondary | ICD-10-CM | POA: Insufficient documentation

## 2017-02-09 MED ORDER — FUROSEMIDE 20 MG PO TABS: 10.0000 mg | ORAL_TABLET | ORAL | 3 refills | Status: DC | PRN

## 2017-02-09 NOTE — Progress Notes (Signed)
Patient ID: Monica Whitaker, female   DOB: 10-Jan-1946, 71 y.o.   MRN: [id number]        Primary Care Physician: Lucille Passy, MD Referring Physician: Dr. Sherryll Burger is a 71 y.o. female with a h/o PAF on flecainide. When I saw her several months ago, she was having increase in afib epiosodes and a flecainide level was done to see if an increase in flecainide was reasonable. However, her rhythm improved and she has really had low afib burden since then. She did have a  mechanical fall at [facility] 8 weeks ago and broke bones in both hands but did not have to have surgery.  F/u in afib clinic 02/10/27. She is doing very well and reports no significant afib burden. Continues on flecainide and eliquis. Reports chronic swelling of lower extremitates for which she will take an occasional furosemide 20 mg of her husbands and is asking for an rx so she will mot have to use his.  Today, she denies symptoms of palpitations, chest pain, shortness of breath, orthopnea, PND, lower extremity edema, dizziness, presyncope, syncope, or neurologic sequela. The patient is tolerating medications without difficulties and is otherwise without complaint today.   Past Medical History:  Diagnosis Date  . Anxiety    controlled with meds  . Atrial fibrillation ([name])    paroxysmal, failed medical therapy with flecainide,  NSVT with tikosyn  . Bruises easily   . CVA (cerebral vascular accident) ([place]) 12/2007  . Depression    medically controlled   . Low blood pressure    no falls, but if gets up to fast  . Stroke ([place]) 7 years ago   Lasting effects on balance, different personality.  . Thyroid disease   . 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 ATRIAL FIBRILLATION ABLATION  10/18/12   PVI by Dr Rayann Heman  . ATRIAL FIBRILLATION ABLATION N/A 10/18/2012   Procedure: ATRIAL FIBRILLATION ABLATION;  Surgeon: Thompson Grayer,  MD;  Location: [facility] CATH LAB;  Service: Cardiovascular;  Laterality: N/A;  . EYE SURGERY     on L/R eye, macular hole   . OPEN REDUCTION INTERNAL FIXATION (ORIF) DISTAL RADIAL FRACTURE Right 11/15/2014   Procedure: OPEN REDUCTION INTERNAL FIXATION (ORIF) RIGHT DISTAL RADIAL FRACTURE WITH ALLOGRAFT BONE GRAFT;  Surgeon: Roseanne Kaufman, MD;  Location: [place];  Service: Orthopedics;  Laterality: Right;  . TEE WITHOUT CARDIOVERSION N/A 10/18/2012   Procedure: TRANSESOPHAGEAL ECHOCARDIOGRAM (TEE);  Surgeon: Lelon Perla, MD;  Location: [facility] ENDOSCOPY;  Service: Cardiovascular;  Laterality: N/A;  Pre-Ablation at 12pm  . TONSILLECTOMY    . VAGINAL HYSTERECTOMY      Current Outpatient Prescriptions  Medication Sig Dispense Refill  . acetaminophen (TYLENOL) 500 MG tablet Take 2 tablets by mouth every 6 hours as needed for pain    . benzonatate (TESSALON) 200 MG capsule Take 1 capsule (200 mg total) by mouth 2 (two) times daily as needed for cough. 20 capsule 0  . calcium carbonate (OS-CAL) 600 MG TABS Take 600 mg by mouth daily with breakfast.     . cholecalciferol (VITAMIN D) 1000 UNITS tablet Take 2,000 Units by mouth daily.     Marland Kitchen dextroamphetamine (DEXTROSTAT) 5 MG tablet Take 5 mg by mouth daily.     Marland Kitchen diltiazem (CARDIZEM CD) 240 MG 24 hr capsule Take 1 capsule (240 mg total)  by mouth daily. Pt takes once extra tablet daily as needed for AFIB 90 capsule 3  . diltiazem (CARDIZEM) 30 MG tablet Ttake 1 tablet every 4 hours AS NEEDED for rapid afib heart rate over 100 45 tablet 1  . DULoxetine (CYMBALTA) 30 MG capsule Take 30 mg by mouth as directed. TAKE 2 CAPSULES EVERY MORNING AND 1 CAPSULE AT NIGHT    . flecainide (TAMBOCOR) 100 MG tablet Take 1 tablet (100 mg total) by mouth 2 (two) times daily. 180 tablet 1  . levothyroxine (SYNTHROID, LEVOTHROID) 25 MCG tablet TAKE 1 TABLET BY MOUTH EVERY DAY 90 tablet 3  . Multiple Vitamin (MULTIVITAMIN) tablet Take 1 tablet by mouth daily.       . nitrofurantoin, macrocrystal-monohydrate, (MACROBID) 100 MG capsule Take 1 capsule (100 mg total) by mouth 2 (two) times daily. 10 capsule 0  . Oral Electrolytes (BUFFERED SALT PO) Take 1 capsule by mouth daily as needed (Electrolyte/Salt supplement).     . rivaroxaban (XARELTO) 20 MG TABS tablet Take 20 mg by mouth daily with supper. One week left    . Timolol Maleate (TIMOPTIC OP) Place 1 drop into the left eye daily.     . vitamin B-12 (CYANOCOBALAMIN) 1000 MCG tablet Take 1,000 mcg by mouth daily.    . vitamin C (ASCORBIC ACID) 500 MG tablet Take 500 mg by mouth daily.    . vitamin E 400 UNIT capsule Take 400 Units by mouth daily.      Marland Kitchen apixaban (ELIQUIS) 5 MG TABS tablet Take 1 tablet (5 mg total) by mouth 2 (two) times daily. (Patient not taking: Reported on 02/09/2017) 180 tablet 1  . furosemide (LASIX) 20 MG tablet Take 0.5 tablets (10 mg total) by mouth as needed for edema. 10 tablet 3   No current facility-administered medications for this encounter.     Allergies  Allergen Reactions  . Darvon Other (See Comments)    Severe panic attacks  . Propoxyphene Other (See Comments)    GI Upset Severe panic attacks GI Upset  . Propoxyphene N-Acetaminophen Other (See Comments)    Severe panic attacks  . Levofloxacin Anxiety and Other (See Comments)    Causes panic attacks.    Social History   Social History  . Marital status: Married    Spouse name: N/A  . Number of children: 0  . Years of education: N/A   Occupational History  . Retired Retired   Social History Main Topics  . Smoking status: Never Smoker  . Smokeless tobacco: Never Used  . Alcohol use 0.5 oz/week    1 Standard drinks or equivalent per week     Comment: regular  . Drug use: No  . Sexual activity: No   Other Topics Concern  . Not on file   Social History Narrative   Lives in [place] with her husband.  No children.  Former Psychologist, prison and probation services      Has a living will-    Would desire CPR   Would  not want prolonged life support if futile.    Family History  Problem Relation Age of Onset  . Multiple sclerosis Father     ROS- All systems are reviewed and negative except as per the HPI above  Physical Exam: Vitals:   02/09/17 1358  BP: 110/72  Pulse: 61  Weight: 176 lb 12.8 oz (80.2 kg)  Height: 5' 8.5" (1.74 m)    GEN- The patient is well appearing, alert and oriented x 3  today.   Head- normocephalic, atraumatic Eyes-  Sclera clear, conjunctiva pink Ears- hearing intact Oropharynx- clear Neck- supple, no JVP Lymph- no cervical lymphadenopathy Lungs- Clear to ausculation bilaterally, normal work of breathing Heart- Regular rate and rhythm, no murmurs, rubs or gallops, PMI not laterally displaced GI- soft, NT, ND, + BS Extremities- no clubbing, cyanosis, or edema MS- no significant deformity or atrophy Skin- no rash or lesion Psych- euthymic mood, full affect Neuro- strength and sensation are intact  EKG-NSR at 61 bpm, , pr int 176 ms, qrs int 86 ms, qtc 334 ms Epic records reviewed Labs reviewed- cmet 2 months ago with normal creatinine 0.66, K+ at 4.4  Assessment and Plan: 1. PAF Currently with low afib burden  Continue flecainide/diltiazem Continue xarelto  2. Chronic LLE edema Will rx furosemide 20 mg one as needed #10 with 3 refills   F/u with Dr. Rayann Heman as scheduled in November  Aliou Mealey C. Railyn House, [facility] [address] [place], [place] [address] [phone]

## 2017-02-10 LAB — URINE CULTURE: Organism ID, Bacteria: NO GROWTH

## 2017-02-11 ENCOUNTER — Encounter: Payer: Self-pay | Admitting: Family Medicine

## 2017-02-16 ENCOUNTER — Other Ambulatory Visit (HOSPITAL_COMMUNITY): Payer: Self-pay | Admitting: *Deleted

## 2017-02-16 DIAGNOSIS — H40052 Ocular hypertension, left eye: Secondary | ICD-10-CM | POA: Diagnosis not present

## 2017-02-16 MED ORDER — DILTIAZEM HCL ER COATED BEADS 240 MG PO CP24: 240.0000 mg | ORAL_CAPSULE | Freq: Every day | ORAL | 3 refills | Status: DC

## 2017-02-16 MED ORDER — FLECAINIDE ACETATE 100 MG PO TABS: 100.0000 mg | ORAL_TABLET | Freq: Two times a day (BID) | ORAL | 3 refills | Status: DC

## 2017-02-22 ENCOUNTER — Telehealth: Payer: Self-pay | Admitting: *Deleted

## 2017-02-22 DIAGNOSIS — E237 Disorder of pituitary gland, unspecified: Secondary | ICD-10-CM

## 2017-02-22 NOTE — Telephone Encounter (Signed)
Thank you :)

## 2017-02-22 NOTE — Telephone Encounter (Signed)
Monica Whitaker, are there neurologists in the area who specialize in hypothalamus dysfunction?

## 2017-02-22 NOTE — Telephone Encounter (Signed)
Pt vm and is requesting a referral for her hypothalamus. She prefers to be seen in [place]. She has previously seen a chiropractor, but is now wanting to see a MD. pls advise

## 2017-02-22 NOTE — Telephone Encounter (Signed)
Yes, [facility], Dr Rexene Alberts and Dr Dohmeier do. Please place Neurology Referral and comment [facility] . I called the patient and spoke to husband and told him you could place a Referral.

## 2017-03-04 ENCOUNTER — Ambulatory Visit (INDEPENDENT_AMBULATORY_CARE_PROVIDER_SITE_OTHER): Payer: Medicare Other | Admitting: Neurology

## 2017-03-04 ENCOUNTER — Encounter: Payer: Self-pay | Admitting: Neurology

## 2017-03-04 VITALS — BP 111/71 | HR 56 | Ht 70.0 in | Wt 175.5 lb

## 2017-03-04 DIAGNOSIS — G4719 Other hypersomnia: Secondary | ICD-10-CM

## 2017-03-04 DIAGNOSIS — M25471 Effusion, right ankle: Secondary | ICD-10-CM

## 2017-03-04 DIAGNOSIS — M25472 Effusion, left ankle: Secondary | ICD-10-CM | POA: Diagnosis not present

## 2017-03-04 DIAGNOSIS — I480 Paroxysmal atrial fibrillation: Secondary | ICD-10-CM

## 2017-03-04 DIAGNOSIS — R0683 Snoring: Secondary | ICD-10-CM | POA: Diagnosis not present

## 2017-03-04 DIAGNOSIS — I63412 Cerebral infarction due to embolism of left middle cerebral artery: Secondary | ICD-10-CM | POA: Insufficient documentation

## 2017-03-04 HISTORY — DX: Cerebral infarction due to embolism of left middle cerebral artery: I63.412

## 2017-03-04 NOTE — Patient Instructions (Signed)
Please remember to try to maintain good sleep hygiene, which means: Keep a regular sleep and wake schedule, try not to exercise or have a meal within 2 hours of your bedtime, try to keep your bedroom conducive for sleep, that is, cool and dark, without light distractors such as an illuminated alarm clock, and refrain from watching TV right before sleep or in the middle of the night and do not keep the TV or radio on during the night. Also, try not to use or play on electronic devices at bedtime, such as your cell phone, tablet PC or laptop. If you like to read at bedtime on an electronic device, try to dim the background light as much as possible. Do not eat in the middle of the night.   We will request a sleep study.    We will look for leg twitching and snoring or sleep apnea.   For chronic insomnia, you are best followed by a psychiatrist and/or sleep psychologist.   We will call you with the sleep study results and make a follow up appointment if needed.   Hypersomnia Hypersomnia is when you feel extremely tired during the day even though you're getting plenty of sleep at night. You may need to take naps during the day, and you may also be extremely difficult to wake up when you are sleeping. What are the causes? The cause of your hypersomnia may not be known. Hypersomnia may be caused by:  Medicines.  Sleep disorders, such as narcolepsy.  Trauma or injury to your head or nervous system.  Using drugs or alcohol.  Tumors.  Medical conditions, such as depression or hypothyroidism.  Genetics.  What are the signs or symptoms? The main symptoms of hypersomnia include:  Feeling extremely tired throughout the day.  Being very difficult to wake up.  Sleeping for longer and longer periods.  Taking naps throughout the day.  Other symptoms may include:  Feeling: ? Restless. ? Annoyed. ? Anxious. ? Low energy.  Having  difficulty: ? Remembering. ? Speaking. ? Thinking.  Losing your appetite.  Experiencing hallucinations.  How is this diagnosed? Hypersomnia may be diagnosed by:  Medical history and physical exam. This will include a sleep history.  Completing sleep logs.  Tests may also be done, such as: ? Polysomnography. ? Multiple sleep latency test (MSLT).  How is this treated? There is no cure for hypersomnia, but treatment can be very effective in helping manage the condition. Treatment may include:  Lifestyle and sleeping strategies to help cope with the condition.  Stimulant medicines.  Treating any underlying causes of hypersomnia.  Follow these instructions at home:  Take medicines only as directed by your health care provider.  Schedule short naps for when you feel sleepiest during the day. Tell your employer or teachers that you have hypersomnia. You may be able to adjust your schedule to include time for naps.  Avoid drinking alcohol or caffeinated beverages.  Do not eat a heavy meal before bedtime. Eat at about the same times every day.  Do not drive or operate heavy machinery if you are sleepy.  Do not swim or go out on the water without a life jacket.  If possible, adjust your schedule so that you do not have to work or be active at night.  Keep all follow-up visits as directed by your health care provider. This is important. Contact a health care provider if:  You have new symptoms.  Your symptoms get worse. Get help   right away if: You have serious thoughts of hurting yourself or someone else. This information is not intended to replace advice given to you by your health care provider. Make sure you discuss any questions you have with your health care provider. Document Released: 07/10/2002 Document Revised: 12/26/2015 Document Reviewed: 02/22/2014 Elsevier Interactive Patient Education  2018 Elsevier Inc.  

## 2017-03-04 NOTE — Progress Notes (Signed)
SLEEP MEDICINE CLINIC   Provider:  Larey Whitaker, Monica Whitaker  Primary Care Physician:  Monica Passy, MD   Referring Provider: Lucille Passy, MD    Chief Complaint  Patient presents with  . New Patient (Initial Visit)    HPI:  Monica Whitaker is a 71 y.o. female , seen here as in a referral/ revisit  from Dr. Deborra Whitaker for An evaluation at [facility] sleep of [place] for intermittent hypersomnia. Monica Whitaker carries a diagnosis of anxiety treated by Monica Whitaker, paroxysmal atrial fibrillation for which she has undergone 2 ablations unsuccessfully, failed medical therapy with flecainide and is now on Tikosyn. She had a stroke in May 2009 the stroke was evident when she woke up in the morning she was not a TPA candidate, it affected the left brain. She usually suffers from lower blood pressure and is sometimes presyncopal, she also noticed that her stroke affected her balance and her personality, she has a history of thyroid disease and ventricular tachycardia, she received the diagnosis at [facility]. The patient has followed a holistic medicine man in [place], who declared that she had a disorder of the hypothalamus. She received treatment through him that would abate her symptoms for 12-18 months and that would return to [facility] for a new adjustment in her medication.  Chief complaint according to patient : " Running low grade fever for one month and being very sleepy " - she considers 97 degrees a fever -  Sleep habits are as follows: Sluggish in evenings- makes all appointments in AM and exercises in AM. She sleeps from 10 PM and falls asleep promptly, stays asleep 3-4 hours, has one restroom break, goes back to sleep- rise at 7 AM.  She does not need an alarm. Her quiet this time and cognitively and physically most sustained activity time is in the morning. She will prepare cup of coffee return morning newspaper, schedules her day.  Sleep medical history and family sleep history: The patient feels that she has  always been achieving sound sleep, she never suffered from prolonged insomnia, hypersomnia is only present when her temperature is elevated. Her low-grade temperature rise has been declared a hypothalamic symptom.  Social history: raised by grandparents, mother died at age 24- when she was 27. 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 No children, are pets .  1 cu of coffee in AM, ETOH one drink a day, Tobacco - never used. No passive exposure.   Review of Systems: Out of a complete 14 system review, the patient complains of only the following symptoms, and all other reviewed systems are negative. Ringing in the ears, fatigue, ankle edema  joint pain, low grade temperature. Easy bruising and bleeding. Depression and anxiety.   snoring  Epworth score  ; she did not fill the questionnaires.  , Fatigue severity score /  depression score/   Social History   Social History  . Marital status: Married    Spouse name: N/A  . Number of children: 0  . Years of education: N/A   Occupational History  . Retired Retired   Social History Main Topics  . Smoking status: Never Smoker  . Smokeless tobacco: Never Used  . Alcohol use 0.5 oz/week    1 Standard drinks or equivalent per week     Comment: regular  . Drug use: No  . Sexual activity: No   Other Topics Concern  . Not on file   Social History Narrative  Lives in [place] with her husband.  No children.  Former Psychologist, prison and probation services      Has a living will-    Would desire CPR   Would not want prolonged life support if futile.    Family History  Problem Relation Age of Onset  . Multiple sclerosis Father     Past Medical History:  Diagnosis Date  . Anxiety    controlled with meds  . Atrial fibrillation ([name])    paroxysmal, failed medical therapy with flecainide,  NSVT with tikosyn  . Bruises easily   . CVA (cerebral vascular accident) ([place]) 12/2007  . Depression    medically controlled   . Low blood pressure      no falls, but if gets up to fast  . Stroke ([place]) 7 years ago   Lasting effects on balance, different personality.  . Thyroid disease   . 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 ATRIAL FIBRILLATION ABLATION  10/18/12   PVI by Dr Rayann Heman  . ATRIAL FIBRILLATION ABLATION N/A 10/18/2012   Procedure: ATRIAL FIBRILLATION ABLATION;  Surgeon: Thompson Grayer, MD;  Location: [facility] CATH LAB;  Service: Cardiovascular;  Laterality: N/A;  . EYE SURGERY     on L/R eye, macular hole   . OPEN REDUCTION INTERNAL FIXATION (ORIF) DISTAL RADIAL FRACTURE Right 11/15/2014   Procedure: OPEN REDUCTION INTERNAL FIXATION (ORIF) RIGHT DISTAL RADIAL FRACTURE WITH ALLOGRAFT BONE GRAFT;  Surgeon: Roseanne Kaufman, MD;  Location: [place];  Service: Orthopedics;  Laterality: Right;  . TEE WITHOUT CARDIOVERSION N/A 10/18/2012   Procedure: TRANSESOPHAGEAL ECHOCARDIOGRAM (TEE);  Surgeon: Lelon Perla, MD;  Location: [facility] ENDOSCOPY;  Service: Cardiovascular;  Laterality: N/A;  Pre-Ablation at 12pm  . TONSILLECTOMY    . VAGINAL HYSTERECTOMY      Current Outpatient Prescriptions  Medication Sig Dispense Refill  . acetaminophen (TYLENOL) 500 MG tablet Take 2 tablets by mouth every 6 hours as needed for pain    . apixaban (ELIQUIS) 5 MG TABS tablet Take 1 tablet (5 mg total) by mouth 2 (two) times daily. 180 tablet 1  . ARIPiprazole (ABILIFY) 10 MG tablet Take 10 mg by mouth daily. Take 1.5 tab    . benzonatate (TESSALON) 200 MG capsule Take 1 capsule (200 mg total) by mouth 2 (two) times daily as needed for cough. 20 capsule 0  . calcium carbonate (OS-CAL) 600 MG TABS Take 600 mg by mouth daily with breakfast.     . cholecalciferol (VITAMIN Whitaker) 1000 UNITS tablet Take 2,000 Units by mouth daily.     Marland Kitchen dextroamphetamine (DEXTROSTAT) 5 MG tablet Take 5 mg by mouth daily.     Marland Kitchen diltiazem (CARDIZEM CD) 240 MG 24 hr capsule Take 1  capsule (240 mg total) by mouth daily. Pt takes once extra tablet daily as needed for AFIB 90 capsule 3  . diltiazem (CARDIZEM) 30 MG tablet Ttake 1 tablet every 4 hours AS NEEDED for rapid afib heart rate over 100 45 tablet 1  . DULoxetine (CYMBALTA) 30 MG capsule Take 30 mg by mouth as directed. TAKE 2 CAPSULES EVERY MORNING AND 1 CAPSULE AT NIGHT    . flecainide (TAMBOCOR) 100 MG tablet Take 1 tablet (100 mg total) by mouth 2 (two) times daily. 180 tablet 3  . furosemide (LASIX) 20 MG tablet Take 0.5 tablets (10 mg total) by mouth as needed for edema.  10 tablet 3  . levothyroxine (SYNTHROID, LEVOTHROID) 25 MCG tablet TAKE 1 TABLET BY MOUTH EVERY DAY 90 tablet 3  . Multiple Vitamin (MULTIVITAMIN) tablet Take 1 tablet by mouth daily.      . nitrofurantoin, macrocrystal-monohydrate, (MACROBID) 100 MG capsule Take 1 capsule (100 mg total) by mouth 2 (two) times daily. 10 capsule 0  . Oral Electrolytes (BUFFERED SALT PO) Take 1 capsule by mouth daily as needed (Electrolyte/Salt supplement).     . rivaroxaban (XARELTO) 20 MG TABS tablet Take 20 mg by mouth daily with supper. One week left    . Timolol Maleate (TIMOPTIC OP) Place 1 drop into the left eye daily.     . vitamin B-12 (CYANOCOBALAMIN) 1000 MCG tablet Take 1,000 mcg by mouth daily.    . vitamin C (ASCORBIC ACID) 500 MG tablet Take 500 mg by mouth daily.    . vitamin E 400 UNIT capsule Take 400 Units by mouth daily.       No current facility-administered medications for this visit.     Allergies as of 03/04/2017 - Review Complete 03/04/2017  Allergen Reaction Noted  . Darvon Other (See Comments) 05/20/2011  . Propoxyphene Other (See Comments) 05/30/2014  . Propoxyphene n-acetaminophen Other (See Comments) 03/11/2010  . Levofloxacin Anxiety and Other (See Comments) 03/11/2010    Vitals: BP 111/71   Pulse (!) 56   Ht 5\' 10"  (1.778 m)   Wt 175 lb 8 oz (79.6 kg)   BMI 25.18 kg/m  Last Weight:  Wt Readings from Last 1 Encounters:    03/04/17 175 lb 8 oz (79.6 kg)   ZDG:LOVF mass index is 25.18 kg/m.     Last Height:   Ht Readings from Last 1 Encounters:  03/04/17 5\' 10"  (1.778 m)    Physical exam:  General: The patient is awake, alert and appears not in acute distress. The patient is well groomed. Head: Normocephalic, atraumatic. Neck is supple. Mallampati 3,  neck circumference 13.5 . Nasal airflow , TMJ click is evident . Retrognathia is seen.  She use a night guard - bruxism marks. Cardiovascular:  Regular rate and rhythm, without  murmurs or carotid bruit, and without distended neck veins. Respiratory: Lungs are clear to auscultation. Skin:  Without evidence of edema, or rash Trunk: BMI is 25. The patient's posture is erect   Neurologic exam : The patient is awake and alert, oriented to place and time.   Memory subjective  described as intact.  Memory testing was deferred - this is a test form Dr Hulen Shouts office.   MOCA:No flowsheet data found. MMSE: MMSE - Mini Mental State Exam 05/06/2016  Orientation to time 5  Orientation to Place 5  Registration 3  Attention/ Calculation 0  Recall 2  Recall-comments pt was unable to recall 1 of 3 words  Language- name 2 objects 0  Language- repeat 1  Language- follow 3 step command 3  Language- read & follow direction 0  Write a sentence 0  Copy design 0  Total score 19    Attention span & concentration ability appears normal.  Speech is fluent,  without  dysarthria, dysphonia or aphasia.  Mood and affect are anxious  Cranial nerves: Pupils are equal and briskly reactive to light. Funduscopic exam without  evidence of pallor or edema. Extraocular movements  in vertical and horizontal planes intact and without nystagmus. Visual fields by finger perimetry are intact.Hearing to finger rub intact. Facial sensation intact to fine touch. Facial motor strength is  symmetric and tongue and uvula move midline. Shoulder shrug was asymmetrical.  Motor exam:  The patient  keeps her right shoulder elevated or walking she does have a slight circumferential gait with a right leg, her right arm remains flexed as she walks. Sensory:  Fine touch, pinprick and vibration were tested in all extremities. Proprioception tested in the upper extremities was normal. Coordination: Rapid alternating movements in the fingers/hands was normal. Finger-to-nose maneuver  normal without evidence of ataxia, dysmetria - there is a right sided tremor- since the stroke.  Gait and station: Patient walks without assistive device. Tandem gait deferred.  Deep tendon reflexes: in the  upper and lower right extremities are more brisk than the left -   Assessment:  After physical and neurologic examination, review of laboratory studies,  Personal review of imaging studies, reports of other /same  Imaging studies, results of polysomnography and / or neurophysiology testing and pre-existing records as far as provided in visit., my assessment is   1) I cannot help this patient in regards to the  presumed hypothalamic dysfunction- This would be an endocrinology work up. She does not report circadian problems.   2) Dr. Deborra Whitaker has already ruled out any infectious source as cause for the elevated temperature, hypersomnia with the need for a daytime nap has been strictly associated with periods of higher body temperature. In between these stopped febrile episodes the patient feels normal, energetic, no need for daytime naps and her nocturnal sleep is described as normal for her as well.  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 Will offer a HST for this patient .   The patient was advised of the nature of the diagnosed disorder , the treatment options and the  risks for general health and wellness arising from not treating the condition.   I spent more than 40 minutes of face to face time with the patient.  Greater than 50% of time was spent in counseling and  coordination of care. We have discussed the diagnosis and differential and I answered the patient's questions.    Plan:  Treatment plan and additional workup : HST - per patients request   CC DR Monica Whitaker.   Monica Seat, MD 04/09/262, 7:85 AM  Certified in Neurology by ABPN Certified in [place] by [facility] [address], [place], [place] [address]

## 2017-03-08 ENCOUNTER — Ambulatory Visit (INDEPENDENT_AMBULATORY_CARE_PROVIDER_SITE_OTHER): Payer: Medicare Other | Admitting: Family Medicine

## 2017-03-08 ENCOUNTER — Encounter: Payer: Self-pay | Admitting: Family Medicine

## 2017-03-08 ENCOUNTER — Ambulatory Visit (INDEPENDENT_AMBULATORY_CARE_PROVIDER_SITE_OTHER)
Admission: RE | Admit: 2017-03-08 | Discharge: 2017-03-08 | Disposition: A | Discharge status: Home / Self Care | Payer: Medicare Other | Source: Ambulatory Visit | Attending: Family Medicine | Admitting: Family Medicine

## 2017-03-08 DIAGNOSIS — M5417 Radiculopathy, lumbosacral region: Secondary | ICD-10-CM

## 2017-03-08 DIAGNOSIS — M5416 Radiculopathy, lumbar region: Secondary | ICD-10-CM | POA: Insufficient documentation

## 2017-03-08 DIAGNOSIS — M5136 Other intervertebral disc degeneration, lumbar region: Secondary | ICD-10-CM | POA: Diagnosis not present

## 2017-03-08 NOTE — Patient Instructions (Signed)
Great to see you.  I will call you with your xray results. 

## 2017-03-08 NOTE — Progress Notes (Signed)
SUBJECTIVE:  Monica Whitaker is a 71 y.o. female who complains of low back pain for 3 second(s), positional with bending or lifting, with radiation down the legs. Precipitating factors: none recalled by the patient. Prior history of back problems: previous osteoarthritis of lumbar spine. There is no numbness in the legs.  Current Outpatient Prescriptions on File Prior to Visit  Medication Sig Dispense Refill  . acetaminophen (TYLENOL) 500 MG tablet Take 2 tablets by mouth every 6 hours as needed for pain    . apixaban (ELIQUIS) 5 MG TABS tablet Take 1 tablet (5 mg total) by mouth 2 (two) times daily. 180 tablet 1  . ARIPiprazole (ABILIFY) 10 MG tablet Take 10 mg by mouth daily. Take 1.5 tab    . calcium carbonate (OS-CAL) 600 MG TABS Take 600 mg by mouth daily with breakfast.     . cholecalciferol (VITAMIN D) 1000 UNITS tablet Take 2,000 Units by mouth daily.     Marland Kitchen dextroamphetamine (DEXTROSTAT) 5 MG tablet Take 5 mg by mouth daily.     Marland Kitchen diltiazem (CARDIZEM CD) 240 MG 24 hr capsule Take 1 capsule (240 mg total) by mouth daily. Pt takes once extra tablet daily as needed for AFIB 90 capsule 3  . diltiazem (CARDIZEM) 30 MG tablet Ttake 1 tablet every 4 hours AS NEEDED for rapid afib heart rate over 100 45 tablet 1  . DULoxetine (CYMBALTA) 30 MG capsule Take 30 mg by mouth as directed. TAKE 2 CAPSULES EVERY MORNING AND 1 CAPSULE AT NIGHT    . flecainide (TAMBOCOR) 100 MG tablet Take 1 tablet (100 mg total) by mouth 2 (two) times daily. 180 tablet 3  . furosemide (LASIX) 20 MG tablet Take 0.5 tablets (10 mg total) by mouth as needed for edema. 10 tablet 3  . levothyroxine (SYNTHROID, LEVOTHROID) 25 MCG tablet TAKE 1 TABLET BY MOUTH EVERY DAY 90 tablet 3  . Multiple Vitamin (MULTIVITAMIN) tablet Take 1 tablet by mouth daily.      . nitrofurantoin, macrocrystal-monohydrate, (MACROBID) 100 MG capsule Take 1 capsule (100 mg total) by mouth 2 (two) times daily. 10 capsule 0  . Oral Electrolytes (BUFFERED  SALT PO) Take 1 capsule by mouth daily as needed (Electrolyte/Salt supplement).     . rivaroxaban (XARELTO) 20 MG TABS tablet Take 20 mg by mouth daily with supper. One week left    . Timolol Maleate (TIMOPTIC OP) Place 1 drop into the left eye daily.     . vitamin B-12 (CYANOCOBALAMIN) 1000 MCG tablet Take 1,000 mcg by mouth daily.    . vitamin C (ASCORBIC ACID) 500 MG tablet Take 500 mg by mouth daily.    . vitamin E 400 UNIT capsule Take 400 Units by mouth daily.       No current facility-administered medications on file prior to visit.     Allergies  Allergen Reactions  . Darvon Other (See Comments)    Severe panic attacks  . Propoxyphene Other (See Comments)    GI Upset Severe panic attacks GI Upset  . Propoxyphene N-Acetaminophen Other (See Comments)    Severe panic attacks  . Levofloxacin Anxiety and Other (See Comments)    Causes panic attacks.    Past Medical History:  Diagnosis Date  . Anxiety    controlled with meds  . Atrial fibrillation ([name])    paroxysmal, failed medical therapy with flecainide,  NSVT with tikosyn  . Bruises easily   . CVA (cerebral vascular accident) ([place]) 12/2007  . Depression  medically controlled   . Low blood pressure    no falls, but if gets up to fast  . Stroke ([place]) 7 years ago   Lasting effects on balance, different personality.  . Thyroid disease   . 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 ATRIAL FIBRILLATION ABLATION  10/18/12   PVI by Dr Rayann Heman  . ATRIAL FIBRILLATION ABLATION N/A 10/18/2012   Procedure: ATRIAL FIBRILLATION ABLATION;  Surgeon: Thompson Grayer, MD;  Location: [facility] CATH LAB;  Service: Cardiovascular;  Laterality: N/A;  . EYE SURGERY     on L/R eye, macular hole   . OPEN REDUCTION INTERNAL FIXATION (ORIF) DISTAL RADIAL FRACTURE Right 11/15/2014   Procedure: OPEN REDUCTION INTERNAL FIXATION (ORIF) RIGHT DISTAL RADIAL FRACTURE WITH  ALLOGRAFT BONE GRAFT;  Surgeon: Roseanne Kaufman, MD;  Location: [place];  Service: Orthopedics;  Laterality: Right;  . TEE WITHOUT CARDIOVERSION N/A 10/18/2012   Procedure: TRANSESOPHAGEAL ECHOCARDIOGRAM (TEE);  Surgeon: Lelon Perla, MD;  Location: [facility] ENDOSCOPY;  Service: Cardiovascular;  Laterality: N/A;  Pre-Ablation at 12pm  . TONSILLECTOMY    . VAGINAL HYSTERECTOMY      Family History  Problem Relation Age of Onset  . Multiple sclerosis Father     Social History   Social History  . Marital status: Married    Spouse name: N/A  . Number of children: 0  . Years of education: N/A   Occupational History  . Retired Retired   Social History Main Topics  . Smoking status: Never Smoker  . Smokeless tobacco: Never Used  . Alcohol use 0.5 oz/week    1 Standard drinks or equivalent per week     Comment: regular  . Drug use: No  . Sexual activity: No   Other Topics Concern  . Not on file   Social History Narrative   Lives in [place] with her husband.  No children.  Former Psychologist, prison and probation services      Has a living will-    Would desire CPR   Would not want prolonged life support if futile.   The PMH, PSH, Social History, Family History, Medications, and allergies have been reviewed in [facility], and have been updated if relevant.  OBJECTIVE: BP 120/80   Pulse 73   Ht 5\' 10"  (1.778 m)   Wt 175 lb (79.4 kg)   SpO2 95%   BMI 25.11 kg/m   Patient appears to be in mild to moderate pain, antalgic gait noted. Lumbosacral spine area reveals no local tenderness or mass.  Painful and reduced LS ROM noted. Straight leg raise is positive at 45 degrees on left. DTR's, motor strength and sensation normal, including heel and toe gait.  Peripheral pulses are palpable. X-Ray: ordered, but results not yet available.  ASSESSMENT:  osteoarthritis of lumbo-sacral spine, with radiculopathy and facet-syndrome  PLAN: For acute pain, rest, intermittent application of heat (do not  sleep on heating pad), analgesics and muscle relaxants are recommended. Discussed longer term treatment plan of prn NSAID's and discussed a home back care exercise program with flexion exercise routine. Proper lifting with avoidance of heavy lifting discussed. Consider Physical Therapy and XRay studies if not improving. Call or return to clinic prn if these symptoms worsen or fail to improve as anticipated.

## 2017-03-12 ENCOUNTER — Encounter: Payer: Self-pay | Admitting: Neurology

## 2017-03-22 ENCOUNTER — Ambulatory Visit (INDEPENDENT_AMBULATORY_CARE_PROVIDER_SITE_OTHER): Payer: Medicare Other | Admitting: Neurology

## 2017-03-22 ENCOUNTER — Institutional Professional Consult (permissible substitution): Payer: Self-pay | Admitting: Neurology

## 2017-03-22 DIAGNOSIS — M25471 Effusion, right ankle: Secondary | ICD-10-CM

## 2017-03-22 DIAGNOSIS — G4710 Hypersomnia, unspecified: Secondary | ICD-10-CM | POA: Diagnosis not present

## 2017-03-22 DIAGNOSIS — M25472 Effusion, left ankle: Secondary | ICD-10-CM

## 2017-03-22 DIAGNOSIS — I480 Paroxysmal atrial fibrillation: Secondary | ICD-10-CM

## 2017-03-22 DIAGNOSIS — G4734 Idiopathic sleep related nonobstructive alveolar hypoventilation: Secondary | ICD-10-CM

## 2017-03-22 DIAGNOSIS — G4719 Other hypersomnia: Secondary | ICD-10-CM

## 2017-03-22 DIAGNOSIS — I63412 Cerebral infarction due to embolism of left middle cerebral artery: Secondary | ICD-10-CM

## 2017-03-23 DIAGNOSIS — M9904 Segmental and somatic dysfunction of sacral region: Secondary | ICD-10-CM | POA: Diagnosis not present

## 2017-03-23 DIAGNOSIS — M5136 Other intervertebral disc degeneration, lumbar region: Secondary | ICD-10-CM | POA: Diagnosis not present

## 2017-03-23 DIAGNOSIS — M9903 Segmental and somatic dysfunction of lumbar region: Secondary | ICD-10-CM | POA: Diagnosis not present

## 2017-03-23 DIAGNOSIS — M791 Myalgia: Secondary | ICD-10-CM | POA: Diagnosis not present

## 2017-03-23 DIAGNOSIS — M545 Low back pain: Secondary | ICD-10-CM | POA: Diagnosis not present

## 2017-03-24 ENCOUNTER — Other Ambulatory Visit: Payer: Self-pay | Admitting: Orthopedic Surgery

## 2017-03-24 DIAGNOSIS — M9933 Osseous stenosis of neural canal of lumbar region: Secondary | ICD-10-CM | POA: Diagnosis not present

## 2017-03-25 DIAGNOSIS — M9904 Segmental and somatic dysfunction of sacral region: Secondary | ICD-10-CM | POA: Diagnosis not present

## 2017-03-25 DIAGNOSIS — M5136 Other intervertebral disc degeneration, lumbar region: Secondary | ICD-10-CM | POA: Diagnosis not present

## 2017-03-25 DIAGNOSIS — M9903 Segmental and somatic dysfunction of lumbar region: Secondary | ICD-10-CM | POA: Diagnosis not present

## 2017-03-25 DIAGNOSIS — M545 Low back pain: Secondary | ICD-10-CM | POA: Diagnosis not present

## 2017-03-25 DIAGNOSIS — M791 Myalgia: Secondary | ICD-10-CM | POA: Diagnosis not present

## 2017-03-28 DIAGNOSIS — G4734 Idiopathic sleep related nonobstructive alveolar hypoventilation: Secondary | ICD-10-CM | POA: Insufficient documentation

## 2017-03-28 NOTE — Procedures (Signed)
[facility] [address]. [place], [place] [address] NAME: Monica Whitaker     DOB: [birth year] MEDICAL RECORD NO: [id number]   DOS: 07/29/17 REFERRING PHYSICIAN: Arnette Norris, MD STUDY PERFORMED: HST HISTORY: [name] is a 71 y.o. Caucasian female, seen for intermittent hypersomnia. Mrs. Tall carries a diagnosis of anxiety treated by Dr. Charlott Holler, paroxysmal atrial fibrillation for which she has undergone 2 ablations unsuccessfully, failed medical therapy with Flecainide and is now on Tikosyn. She had a stroke in May 2009 - she was not a TPA candidate, it affected the left brain. She usually suffers from lower blood pressure and is sometimes pre-syncopal, she has thyroid disease and ventricular tachycardia, she received the diagnosis at [facility].  The patient has followed a holistic medicine therapy practitioner in [facility], who declared that she had" a disorder of the hypothalamus". She received treatment through him that would abate her symptoms, every 12-18 months she would return to [facility] for a new adjustment in her medication.   Chief complaint according to patient: " Running low grade fever for one month and being very sleepy " - she considers 97 degrees a fever - BMI is 25.1.Epworth score = she did not fill the questionnaire.     STUDY RESULTS:  Total Recording Time: 8h 55m; Total Apnea/Hypopnea Index (AHI): 6.5/hr. RDI was 7.7/hr.  Average Oxygen Saturation: SpO2 90%; Lowest Oxygen Saturation: 80%,  Total desaturation time <=88% was 55 minutes Average Heart Rate: 63 bpm ( 54-205 bpm - irregular) IMPRESSION: Only Mild sleep apnea is noted, but prolonged hypoxia time. The heart rate varied greatly. Atrial fibrillation is likely present.  RECOMMENDATION: Most likely, her hypersomnia is related to atrial fibrillation. Treatment of even mild apnea may help to sustain sinus rhythm after ablation or spontaneous recovery.  I will order an auto titration CPAP  5-12 cm water with mask of choice.  I certify that I have reviewed the raw data recording prior to the issuance of this report in accordance with the standards of the American Academy of Sleep Medicine (AASM). Larey Seat, MD    03-26-2017 [facility] [place], ABPN and ABSM Member of and accredited by AASM

## 2017-03-28 NOTE — Addendum Note (Signed)
Addended by: Larey Seat on: 03/28/2017 08:10 PM   Modules accepted: Orders

## 2017-03-29 ENCOUNTER — Encounter: Payer: Self-pay | Admitting: Neurology

## 2017-03-29 DIAGNOSIS — M9904 Segmental and somatic dysfunction of sacral region: Secondary | ICD-10-CM | POA: Diagnosis not present

## 2017-03-29 DIAGNOSIS — M9903 Segmental and somatic dysfunction of lumbar region: Secondary | ICD-10-CM | POA: Diagnosis not present

## 2017-03-29 DIAGNOSIS — M5136 Other intervertebral disc degeneration, lumbar region: Secondary | ICD-10-CM | POA: Diagnosis not present

## 2017-03-29 DIAGNOSIS — M791 Myalgia: Secondary | ICD-10-CM | POA: Diagnosis not present

## 2017-03-29 DIAGNOSIS — M545 Low back pain: Secondary | ICD-10-CM | POA: Diagnosis not present

## 2017-03-30 ENCOUNTER — Ambulatory Visit
Admission: RE | Admit: 2017-03-30 | Discharge: 2017-03-30 | Disposition: A | Discharge status: Home / Self Care | Payer: Medicare Other | Source: Ambulatory Visit | Attending: Orthopedic Surgery | Admitting: Orthopedic Surgery

## 2017-03-30 DIAGNOSIS — M47816 Spondylosis without myelopathy or radiculopathy, lumbar region: Secondary | ICD-10-CM | POA: Diagnosis not present

## 2017-03-30 DIAGNOSIS — M545 Low back pain: Secondary | ICD-10-CM | POA: Diagnosis not present

## 2017-03-30 DIAGNOSIS — M9933 Osseous stenosis of neural canal of lumbar region: Secondary | ICD-10-CM | POA: Insufficient documentation

## 2017-03-31 ENCOUNTER — Telehealth: Payer: Self-pay | Admitting: Neurology

## 2017-03-31 NOTE — Telephone Encounter (Signed)
Informed patient thru mychart about the sleep apnea in response to her questions about the sleep study results.  I will send the order to [facility] to get her set up. I have instructed the patient to call back and schedule a follow up apt for insurance purposes. Pt would need to be seen either with MD or NP by the end of November.

## 2017-04-04 DIAGNOSIS — N390 Urinary tract infection, site not specified: Secondary | ICD-10-CM | POA: Diagnosis not present

## 2017-04-21 ENCOUNTER — Telehealth: Payer: Self-pay | Admitting: *Deleted

## 2017-04-21 NOTE — Telephone Encounter (Signed)
Information has been submitted to pts insurance for verification of benefits. Awaiting response for coverage  

## 2017-04-28 DIAGNOSIS — G4733 Obstructive sleep apnea (adult) (pediatric): Secondary | ICD-10-CM | POA: Diagnosis not present

## 2017-04-28 NOTE — Telephone Encounter (Signed)
I called the patient. No answer at this time. LVM for the patient making her aware that [facility] will come to her. Also with supplies they will mail supplies to her. [facility] is the best one to offer.  If patient decides she would like to use another one then I think the only other place would be [facility] in [place], [place]

## 2017-04-28 NOTE — Telephone Encounter (Signed)
Pt states that [facility] is 45 mins away for her, she'd like to know if there is a place in [place] that she can use.  Please call

## 2017-05-06 ENCOUNTER — Telehealth: Payer: Self-pay | Admitting: Family Medicine

## 2017-05-06 NOTE — Telephone Encounter (Signed)
Disregard this dr Diona Browner  This was supposed to go to [facility]

## 2017-05-06 NOTE — Telephone Encounter (Signed)
Best number [phone]    Pt came in today.  She is a patient of dr Deborra Medina and has scheduled a new pt appointment with dr Diona Browner 03/10/18.  She wanted to know who she can put as a PCP on her insurance card till she establish with dr Diona Browner.  She doesn't want to establish with a NP.  Please advise

## 2017-05-07 ENCOUNTER — Ambulatory Visit (INDEPENDENT_AMBULATORY_CARE_PROVIDER_SITE_OTHER): Payer: Medicare Other

## 2017-05-07 VITALS — BP 102/80 | HR 72 | Temp 97.7°F | Ht 69.5 in | Wt 178.5 lb

## 2017-05-07 DIAGNOSIS — Z0000 Encounter for general adult medical examination without abnormal findings: Secondary | ICD-10-CM | POA: Diagnosis not present

## 2017-05-07 DIAGNOSIS — E7800 Pure hypercholesterolemia, unspecified: Secondary | ICD-10-CM | POA: Diagnosis not present

## 2017-05-07 LAB — LIPID PANEL
Cholesterol: 223 mg/dL — ABNORMAL HIGH (ref <200)
HDL: 111 mg/dL (ref >50)
LDL CHOLESTEROL (CALC): 99 mg/dL
NON-HDL CHOLESTEROL (CALC): 112 mg/dL (ref <130)
TRIGLYCERIDES: 50 mg/dL (ref <150)
Total CHOL/HDL Ratio: 2 (calc) (ref <5.0)

## 2017-05-07 NOTE — Patient Instructions (Signed)
Monica Whitaker , Thank you for taking time to come for your Medicare Wellness Visit. I appreciate your ongoing commitment to your health goals. Please review the following plan we discussed and let me know if I can assist you in the future.   These are the goals we discussed: Goals    . Increase physical activity          Starting 05/07/2017, I will continue to exercise at least 10 hours weekly.        This is a list of the screening recommended for you and due dates:  Health Maintenance  Topic Date Due  . Flu Shot  10/31/2017*  . Mammogram  07/21/2018  . Colon Cancer Screening  03/06/2024  . Tetanus Vaccine  10/29/2025  . DEXA scan (bone density measurement)  Completed  .  Hepatitis C: One time screening is recommended by Center for Disease Control  (CDC) for  adults born from [birth year] through 1964.   Completed  . Pneumonia vaccines  Completed  *Topic was postponed. The date shown is not the original due date.   Preventive Care for Adults  A healthy lifestyle and preventive care can promote health and wellness. Preventive health guidelines for adults include the following key practices.  . A routine yearly physical is a good way to check with your health care provider about your health and preventive screening. It is a chance to share any concerns and updates on your health and to receive a thorough exam.  . Visit your dentist for a routine exam and preventive care every 6 months. Brush your teeth twice a day and floss once a day. Good oral hygiene prevents tooth decay and gum disease.  . The frequency of eye exams is based on your age, health, family medical history, use  of contact lenses, and other factors. Follow your health care provider's ecommendations for frequency of eye exams.  . Eat a healthy diet. Foods like vegetables, fruits, whole grains, low-fat dairy products, and lean protein foods contain the nutrients you need without too many calories. Decrease your intake of foods  high in solid fats, added sugars, and salt. Eat the right amount of calories for you. Get information about a proper diet from your health care provider, if necessary.  . Regular physical exercise is one of the most important things you can do for your health. Most adults should get at least 150 minutes of moderate-intensity exercise (any activity that increases your heart rate and causes you to sweat) each week. In addition, most adults need muscle-strengthening exercises on 2 or more days a week.  Silver Sneakers may be a benefit available to you. To determine eligibility, you may visit the website: www.silversneakers.com or contact program at [phone] Mon-Fri between 8AM-8PM.   . Maintain a healthy weight. The body mass index (BMI) is a screening tool to identify possible weight problems. It provides an estimate of body fat based on height and weight. Your health care provider can find your BMI and can help you achieve or maintain a healthy weight.   For adults 20 years and older: ? A BMI below 18.5 is considered underweight. ? A BMI of 18.5 to 24.9 is normal. ? A BMI of 25 to 29.9 is considered overweight. ? A BMI of 30 and above is considered obese.   . Maintain normal blood lipids and cholesterol levels by exercising and minimizing your intake of saturated fat. Eat a balanced diet with plenty of fruit and vegetables. Blood  tests for lipids and cholesterol should begin at age 34 and be repeated every 5 years. If your lipid or cholesterol levels are high, you are over 50, or you are at high risk for heart disease, you may need your cholesterol levels checked more frequently. Ongoing high lipid and cholesterol levels should be treated with medicines if diet and exercise are not working.  . If you smoke, find out from your health care provider how to quit. If you do not use tobacco, please do not start.  . If you choose to drink alcohol, please do not consume more than 2 drinks per day.  One drink is considered to be 12 ounces (355 mL) of beer, 5 ounces (148 mL) of wine, or 1.5 ounces (44 mL) of liquor.  . If you are 44-84 years old, ask your health care provider if you should take aspirin to prevent strokes.  . Use sunscreen. Apply sunscreen liberally and repeatedly throughout the day. You should seek shade when your shadow is shorter than you. Protect yourself by wearing long sleeves, pants, a wide-brimmed hat, and sunglasses year round, whenever you are outdoors.  . Once a month, do a whole body skin exam, using a mirror to look at the skin on your back. Tell your health care provider of new moles, moles that have irregular borders, moles that are larger than a pencil eraser, or moles that have changed in shape or color.

## 2017-05-09 NOTE — Progress Notes (Signed)
PCP notes:   Health maintenance:  Flu vaccine - pt has requested to have vaccine at CPE appt  Abnormal screenings:   Fall risk - hx of multiple falls with bruising only Depression score: 9  Patient concerns:   None  Nurse concerns:  None  Next PCP appt:   10/11 @ 11AM  I reviewed health advisor's note, was available for consultation on the day of service listed in this note, and agree with documentation and plan. Routed to PCP for mgmt esp re: Depression score: 9 Elsie Stain, MD.

## 2017-05-09 NOTE — Progress Notes (Signed)
Pre visit review using our clinic review tool, if applicable. No additional management support is needed unless otherwise documented below in the visit note. 

## 2017-05-09 NOTE — Progress Notes (Signed)
Subjective:   Monica Whitaker is a 71 y.o. female who presents for Medicare Annual (Subsequent) preventive examination.  Review of Systems:  N/A Cardiac Risk Factors include: advanced age (>96men, >7 women)     Objective:     Vitals: BP 102/80 (BP Location: Right Arm, Patient Position: Sitting, Cuff Size: Normal)   Pulse 72   Temp 97.7 F (36.5 C) (Oral)   Ht 5' 9.5" (1.765 m) Comment: shoes  Wt 178 lb 8 oz (81 kg)   SpO2 96%   BMI 25.98 kg/m   Body mass index is 25.98 kg/m.   Tobacco History  Smoking Status  . Never Smoker  Smokeless Tobacco  . Never Used     Counseling given: No   Past Medical History:  Diagnosis Date  . Anxiety    controlled with meds  . Atrial fibrillation ([name])    paroxysmal, failed medical therapy with flecainide,  NSVT with tikosyn  . Bruises easily   . CVA (cerebral vascular accident) ([place]) 12/2007  . Depression    medically controlled   . Low blood pressure    no falls, but if gets up to fast  . Stroke ([place]) 7 years ago   Lasting effects on balance, different personality.  . Thyroid disease   . 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 ATRIAL FIBRILLATION ABLATION  10/18/12   PVI by Dr Rayann Heman  . ATRIAL FIBRILLATION ABLATION N/A 10/18/2012   Procedure: ATRIAL FIBRILLATION ABLATION;  Surgeon: Thompson Grayer, MD;  Location: [facility] CATH LAB;  Service: Cardiovascular;  Laterality: N/A;  . EYE SURGERY     on L/R eye, macular hole   . OPEN REDUCTION INTERNAL FIXATION (ORIF) DISTAL RADIAL FRACTURE Right 11/15/2014   Procedure: OPEN REDUCTION INTERNAL FIXATION (ORIF) RIGHT DISTAL RADIAL FRACTURE WITH ALLOGRAFT BONE GRAFT;  Surgeon: Roseanne Kaufman, MD;  Location: [place];  Service: Orthopedics;  Laterality: Right;  . TEE WITHOUT CARDIOVERSION N/A 10/18/2012   Procedure: TRANSESOPHAGEAL ECHOCARDIOGRAM (TEE);  Surgeon: Lelon Perla, MD;   Location: [facility] ENDOSCOPY;  Service: Cardiovascular;  Laterality: N/A;  Pre-Ablation at 12pm  . TONSILLECTOMY    . VAGINAL HYSTERECTOMY     Family History  Problem Relation Age of Onset  . Multiple sclerosis Father    History  Sexual Activity  . Sexual activity: No    Outpatient Encounter Prescriptions as of 05/07/2017  Medication Sig  . acetaminophen (TYLENOL) 500 MG tablet Take 2 tablets by mouth every 6 hours as needed for pain  . apixaban (ELIQUIS) 5 MG TABS tablet Take 1 tablet (5 mg total) by mouth 2 (two) times daily.  . ARIPiprazole (ABILIFY) 10 MG tablet Take 10 mg by mouth daily. Take 1.5 tab  . calcium carbonate (OS-CAL) 600 MG TABS Take 600 mg by mouth daily with breakfast.   . cholecalciferol (VITAMIN D) 1000 UNITS tablet Take 2,000 Units by mouth daily.   Marland Kitchen dextroamphetamine (DEXTROSTAT) 5 MG tablet Take 5 mg by mouth daily.   Marland Kitchen diltiazem (CARDIZEM CD) 240 MG 24 hr capsule Take 1 capsule (240 mg total) by mouth daily. Pt takes once extra tablet daily as needed for AFIB  . diltiazem (CARDIZEM) 30 MG tablet Ttake 1 tablet every 4 hours AS NEEDED for rapid afib heart rate over 100  . DULoxetine (CYMBALTA) 30 MG capsule Take 30 mg by mouth as directed. TAKE 2  CAPSULES EVERY MORNING AND 1 CAPSULE AT NIGHT  . flecainide (TAMBOCOR) 100 MG tablet Take 1 tablet (100 mg total) by mouth 2 (two) times daily.  . furosemide (LASIX) 20 MG tablet Take 0.5 tablets (10 mg total) by mouth as needed for edema.  Marland Kitchen levothyroxine (SYNTHROID, LEVOTHROID) 25 MCG tablet TAKE 1 TABLET BY MOUTH EVERY DAY  . Multiple Vitamin (MULTIVITAMIN) tablet Take 1 tablet by mouth daily.    . nitrofurantoin, macrocrystal-monohydrate, (MACROBID) 100 MG capsule Take 1 capsule (100 mg total) by mouth 2 (two) times daily.  . Oral Electrolytes (BUFFERED SALT PO) Take 1 capsule by mouth daily as needed (Electrolyte/Salt supplement).   . rivaroxaban (XARELTO) 20 MG TABS tablet Take 20 mg by mouth daily with supper. One week  left  . Timolol Maleate (TIMOPTIC OP) Place 1 drop into the left eye daily.   . vitamin B-12 (CYANOCOBALAMIN) 1000 MCG tablet Take 1,000 mcg by mouth daily.  . vitamin C (ASCORBIC ACID) 500 MG tablet Take 500 mg by mouth daily.  . vitamin E 400 UNIT capsule Take 400 Units by mouth daily.     No facility-administered encounter medications on file as of 05/07/2017.     Activities of Daily Living In your present state of health, do you have any difficulty performing the following activities: 05/07/2017  Hearing? N  Vision? N  Difficulty concentrating or making decisions? Y  Walking or climbing stairs? Y  Dressing or bathing? N  Doing errands, shopping? N  Preparing Food and eating ? N  Using the Toilet? N  In the past six months, have you accidently leaked urine? Y  Do you have problems with loss of bowel control? N  Managing your Medications? N  Managing your Finances? N  Housekeeping or managing your Housekeeping? N  Some recent data might be hidden    Patient Care Team: Lucille Passy, MD as PCP - General Sherran Needs, NP as Nurse Practitioner (Nurse Practitioner) Thompson Grayer, MD as Consulting Physician (Cardiology) Roseanne Kaufman, MD as Consulting Physician (Orthopedic Surgery) Josefine Class, MD as Referring Physician (Gastroenterology) Estill Cotta, MD as Consulting Physician (Ophthalmology)    Assessment:     Hearing Screening   125Hz  250Hz  500Hz  1000Hz  2000Hz  3000Hz  4000Hz  6000Hz  8000Hz   Right ear:   40 40 40  40    Left ear:   40 40 40  40    Vision Screening Comments: Last vision exam in July 2018 with Dr. Thomasene Ripple   Exercise Activities and Dietary recommendations Current Exercise Habits: Home exercise routine;Structured exercise class, Type of exercise: strength training/weights;stretching;yoga;Other - see comments (STAB class), Time (Minutes): 60, Frequency (Times/Week): >7, Weekly Exercise (Minutes/Week): 0, Intensity: Moderate, Exercise  limited by: None identified  Goals    . Increase physical activity          Starting 05/07/2017, I will continue to exercise at least 10 hours weekly.       Fall Risk Fall Risk  05/07/2017 05/06/2016 04/04/2015 11/10/2014  Falls in the past year? Yes - Yes No  Comment 4 falls in past 12 mths due to loss of balance; bruising only; no medical treatment - - -  Number falls in past yr: 2 or more 1 1 -  Injury with Fall? Yes Yes Yes -  Comment - - broken R wrist -  Risk for fall due to : - History of fall(s) - -  Follow up - Falls evaluation completed - -   Depression Screen PHQ  2/9 Scores 05/07/2017 03/08/2017 05/06/2016 04/04/2015  PHQ - 2 Score 4 4 3 1   PHQ- 9 Score 9 10 7  -     Cognitive Function MMSE - Mini Mental State Exam 05/07/2017 05/06/2016  Orientation to time 5 5  Orientation to Place 5 5  Registration 3 3  Attention/ Calculation 0 0  Recall 3 2  Recall-comments - pt was unable to recall 1 of 3 words  Language- name 2 objects 0 0  Language- repeat 1 1  Language- follow 3 step command 3 3  Language- read & follow direction 0 0  Write a sentence 0 0  Copy design 0 0  Total score 20 19     PLEASE NOTE: A Mini-Cog screen was completed. Maximum score is 20. A value of 0 denotes this part of [place] MMSE was not completed or the patient failed this part of the Mini-Cog screening.   Mini-Cog Screening Orientation to Time - Max 5 pts Orientation to Place - Max 5 pts Registration - Max 3 pts Recall - Max 3 pts Language Repeat - Max 1 pts Language Follow 3 Step Command - Max 3 pts   Immunization History  Administered Date(s) Administered  . Influenza Split 05/21/2011, 05/31/2012  . Influenza Whole 06/02/2010  . Influenza, High Dose Seasonal PF 05/24/2014  . Influenza,inj,Quad PF,6+ Mos 04/04/2015, 05/06/2016  . Pneumococcal Conjugate-13 10/02/2013  . Pneumococcal Polysaccharide-23 04/04/2015  . Td 04/18/2008  . Tdap 10/30/2015  . Zoster 04/18/2008   Screening  Tests Health Maintenance  Topic Date Due  . INFLUENZA VACCINE  10/31/2017 (Originally 03/03/2017)  . MAMMOGRAM  07/21/2018  . COLONOSCOPY  03/06/2024  . TETANUS/TDAP  10/29/2025  . DEXA SCAN  Completed  . Hepatitis C Screening  Completed  . PNA vac Low Risk Adult  Completed      Plan:     I have personally reviewed and addressed the Medicare Annual Wellness questionnaire and have noted the following in the patient's chart:  A. Medical and social history B. Use of alcohol, tobacco or illicit drugs  C. Current medications and supplements D. Functional ability and status E.  Nutritional status F.  Physical activity G. Advance directives H. List of other physicians I.  Hospitalizations, surgeries, and ER visits in previous 12 months J.  [place] to include hearing, vision, cognitive, depression L. Referrals and appointments - none  In addition, I have reviewed and discussed with patient certain preventive protocols, quality metrics, and best practice recommendations. A written personalized care plan for preventive services as well as general preventive health recommendations were provided to patient.  See attached scanned questionnaire for additional information.   Signed,   Lindell Noe, MHA, BS, LPN Health Coach

## 2017-05-10 ENCOUNTER — Other Ambulatory Visit: Payer: Self-pay | Admitting: Internal Medicine

## 2017-05-10 NOTE — Progress Notes (Signed)
I reviewed health advisor's note, was available for consultation, and agree with documentation and plan.  

## 2017-05-10 NOTE — Telephone Encounter (Signed)
Pt last saw Roderic Palau, NP on 02/09/17, last labs 11/19/16 Creat 0.66, age 71, weight 81kg, based on specified criteria pt is on appropriate dosage of Eliquis 5mg  BID.  Will refill rx.

## 2017-05-13 ENCOUNTER — Encounter: Payer: Self-pay | Admitting: Family Medicine

## 2017-05-13 ENCOUNTER — Ambulatory Visit (INDEPENDENT_AMBULATORY_CARE_PROVIDER_SITE_OTHER): Payer: Medicare Other | Admitting: Family Medicine

## 2017-05-13 VITALS — BP 116/82 | HR 77 | Temp 98.1°F | Ht 70.0 in | Wt 176.8 lb

## 2017-05-13 DIAGNOSIS — Z8673 Personal history of transient ischemic attack (TIA), and cerebral infarction without residual deficits: Secondary | ICD-10-CM

## 2017-05-13 DIAGNOSIS — I480 Paroxysmal atrial fibrillation: Secondary | ICD-10-CM | POA: Diagnosis not present

## 2017-05-13 DIAGNOSIS — F341 Dysthymic disorder: Secondary | ICD-10-CM

## 2017-05-13 DIAGNOSIS — I63412 Cerebral infarction due to embolism of left middle cerebral artery: Secondary | ICD-10-CM

## 2017-05-13 DIAGNOSIS — Z01419 Encounter for gynecological examination (general) (routine) without abnormal findings: Secondary | ICD-10-CM | POA: Diagnosis not present

## 2017-05-13 DIAGNOSIS — Z23 Encounter for immunization: Secondary | ICD-10-CM | POA: Diagnosis not present

## 2017-05-13 DIAGNOSIS — M8000XP Age-related osteoporosis with current pathological fracture, unspecified site, subsequent encounter for fracture with malunion: Secondary | ICD-10-CM | POA: Diagnosis not present

## 2017-05-13 NOTE — Assessment & Plan Note (Signed)
Reviewed preventive care protocols, scheduled due services, and updated immunizations Discussed nutrition, exercise, diet, and healthy lifestyle.  

## 2017-05-13 NOTE — Patient Instructions (Signed)
Great to see you.  Thank you so much for allowing me to care for you.  It was such an Surveyor, minerals.  Please call any time [phone]

## 2017-05-13 NOTE — Assessment & Plan Note (Signed)
Followed by Dr. Rockey Situ. On anticoagulation.

## 2017-05-13 NOTE — Assessment & Plan Note (Signed)
On prolia 

## 2017-05-13 NOTE — Progress Notes (Signed)
Subjective:   Patient ID: Monica Whitaker, female    DOB: [birth year], 71 y.o.   MRN: [id number]  Monica Whitaker is a pleasant 71 y.o. year old female who presents to clinic today with Follow-up (Patient is here today for a follow-up.  Would like to get flu vaccine today.) and Annual Exam on 05/13/2017  HPI: Medicare annual wellness visit with Candis Musa, RN on 05/07/17. Note reviewed.  Concern for depression- PHQ9 of 9 at that [place]. Under more stress with her husband's decline in health.  Taking Cymbalta and Abilify. Seeing psychiatry, Dr. Clovis Pu.  Health Maintenance  Topic Date Due  . INFLUENZA VACCINE  10/31/2017 (Originally 03/03/2017)  . MAMMOGRAM  07/21/2018  . COLONOSCOPY  03/06/2024  . TETANUS/TDAP  10/29/2025  . DEXA SCAN  Completed  . Hepatitis C Screening  Completed  . PNA vac Low Risk Adult  Completed     Afib- followed by Dr. Rayann Heman.  Was last seen by Roderic Palau, NP on 02/09/17.  Note reviewed..   Advised to continue flecainide/diltiazem and lasix. On Eliquis for anticoagulation. Advised follow up with Dr. Rayann Heman in 3 months.  Osteoporosis- DEXA 02/2016, on prolia.  Hypothyroidism- Euthyroid on current dose of synthroid. Lab Results  Component Value Date   TSH 1.71 11/19/2016      Lab Results  Component Value Date   CHOL 223 (H) 05/07/2017   HDL 111 05/07/2017   LDLCALC 113 (H) 05/06/2016   LDLDIRECT 123.5 09/18/2013   TRIG 50 05/07/2017   CHOLHDL 2.0 05/07/2017   Lab Results  Component Value Date   CREATININE 0.66 11/19/2016   Lab Results  Component Value Date   NA 137 11/19/2016   K 4.4 11/19/2016   CL 100 11/19/2016   CO2 32 11/19/2016   Lab Results  Component Value Date   ALT 18 11/19/2016   AST 22 11/19/2016   ALKPHOS 54 11/19/2016   BILITOT 0.7 11/19/2016   Lab Results  Component Value Date   TSH 1.71 11/19/2016   Lab Results  Component Value Date   WBC 4.5 11/19/2016   HGB 13.2 11/19/2016   HCT 39.5 11/19/2016   MCV  92.5 11/19/2016   PLT 283.0 11/19/2016      Patient Active Problem List   Diagnosis Date Noted  . Well woman exam 05/13/2017  . Hypoxia, sleep related 03/28/2017  . Cerebrovascular accident (CVA) due to embolism of left middle cerebral artery ([place]) 03/04/2017  . Excessive daytime sleepiness 03/04/2017  . Paresthesia of both lower extremities 11/19/2016  . Osteoporosis 12/26/2015  . ANXIETY DEPRESSION 04/10/2010  . Cerebral embolism 04/10/2010  . Paroxysmal atrial fibrillation ([place]) 03/11/2010   Past Medical History:  Diagnosis Date  . Anxiety    controlled with meds  . Atrial fibrillation ([name])    paroxysmal, failed medical therapy with flecainide,  NSVT with tikosyn  . Bruises easily   . CVA (cerebral vascular accident) ([place]) 12/2007  . Depression    medically controlled   . Low blood pressure    no falls, but if gets up to fast  . Stroke ([place]) 7 years ago   Lasting effects on balance, different personality.  . Thyroid disease   . 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 ATRIAL FIBRILLATION ABLATION  10/18/12   PVI by Dr Rayann Heman  . ATRIAL FIBRILLATION ABLATION N/A 10/18/2012   Procedure: ATRIAL  FIBRILLATION ABLATION;  Surgeon: Thompson Grayer, MD;  Location: [facility] CATH LAB;  Service: Cardiovascular;  Laterality: N/A;  . EYE SURGERY     on L/R eye, macular hole   . OPEN REDUCTION INTERNAL FIXATION (ORIF) DISTAL RADIAL FRACTURE Right 11/15/2014   Procedure: OPEN REDUCTION INTERNAL FIXATION (ORIF) RIGHT DISTAL RADIAL FRACTURE WITH ALLOGRAFT BONE GRAFT;  Surgeon: Roseanne Kaufman, MD;  Location: [place];  Service: Orthopedics;  Laterality: Right;  . TEE WITHOUT CARDIOVERSION N/A 10/18/2012   Procedure: TRANSESOPHAGEAL ECHOCARDIOGRAM (TEE);  Surgeon: Lelon Perla, MD;  Location: [facility] ENDOSCOPY;  Service: Cardiovascular;  Laterality: N/A;  Pre-Ablation at 12pm  . TONSILLECTOMY    .  VAGINAL HYSTERECTOMY     Social History  Substance Use Topics  . Smoking status: Never Smoker  . Smokeless tobacco: Never Used  . Alcohol use 0.5 oz/week    1 Standard drinks or equivalent per week     Comment: regular   Family History  Problem Relation Age of Onset  . Multiple sclerosis Father    Allergies  Allergen Reactions  . Darvon Other (See Comments)    Severe panic attacks  . Propoxyphene Other (See Comments)    GI Upset Severe panic attacks GI Upset  . Propoxyphene N-Acetaminophen Other (See Comments)    Severe panic attacks  . Levofloxacin Anxiety and Other (See Comments)    Causes panic attacks.   Current Outpatient Prescriptions on File Prior to Visit  Medication Sig Dispense Refill  . acetaminophen (TYLENOL) 500 MG tablet Take 2 tablets by mouth every 6 hours as needed for pain    . ARIPiprazole (ABILIFY) 10 MG tablet Take 10 mg by mouth daily. Take 1.5 tab    . calcium carbonate (OS-CAL) 600 MG TABS Take 600 mg by mouth daily with breakfast.     . cholecalciferol (VITAMIN D) 1000 UNITS tablet Take 2,000 Units by mouth daily.     Marland Kitchen dextroamphetamine (DEXTROSTAT) 5 MG tablet Take 5 mg by mouth daily.     Marland Kitchen diltiazem (CARDIZEM CD) 240 MG 24 hr capsule Take 1 capsule (240 mg total) by mouth daily. Pt takes once extra tablet daily as needed for AFIB 90 capsule 3  . diltiazem (CARDIZEM) 30 MG tablet Ttake 1 tablet every 4 hours AS NEEDED for rapid afib heart rate over 100 45 tablet 1  . DULoxetine (CYMBALTA) 30 MG capsule Take 30 mg by mouth as directed. TAKE 2 CAPSULES EVERY MORNING AND 1 CAPSULE AT NIGHT    . ELIQUIS 5 MG TABS tablet TAKE 1 TABLET BY MOUTH TWICE DAILY 180 tablet 1  . flecainide (TAMBOCOR) 100 MG tablet Take 1 tablet (100 mg total) by mouth 2 (two) times daily. 180 tablet 3  . furosemide (LASIX) 20 MG tablet Take 0.5 tablets (10 mg total) by mouth as needed for edema. 10 tablet 3  . levothyroxine (SYNTHROID, LEVOTHROID) 25 MCG tablet TAKE 1 TABLET BY  MOUTH EVERY DAY 90 tablet 3  . Multiple Vitamin (MULTIVITAMIN) tablet Take 1 tablet by mouth daily.      . nitrofurantoin, macrocrystal-monohydrate, (MACROBID) 100 MG capsule Take 1 capsule (100 mg total) by mouth 2 (two) times daily. 10 capsule 0  . Oral Electrolytes (BUFFERED SALT PO) Take 1 capsule by mouth daily as needed (Electrolyte/Salt supplement).     . Timolol Maleate (TIMOPTIC OP) Place 1 drop into the left eye daily.     . vitamin B-12 (CYANOCOBALAMIN) 1000 MCG tablet Take 1,000 mcg by mouth daily.    Marland Kitchen  vitamin C (ASCORBIC ACID) 500 MG tablet Take 500 mg by mouth daily.    . vitamin E 400 UNIT capsule Take 400 Units by mouth daily.       No current facility-administered medications on file prior to visit.    The PMH, PSH, Social History, Family History, Medications, and allergies have been reviewed in [facility], and have been updated if relevant.. Review of Systems  Constitutional: Negative.   HENT: Negative.   Respiratory: Negative.   Cardiovascular: Negative.   Gastrointestinal: Negative.   Endocrine: Negative.   Genitourinary: Negative.   Musculoskeletal: Negative.   Skin: Negative.   Allergic/Immunologic: Negative.   Neurological: Negative.   Hematological: Negative.   Psychiatric/Behavioral: Negative.   All other systems reviewed and are negative.       Objective:    BP 116/82 (BP Location: Left Arm, Patient Position: Sitting, Cuff Size: Normal)   Pulse 77   Temp 98.1 F (36.7 C) (Oral)   Ht 5\' 10"  (1.778 m)   Wt 176 lb 12.8 oz (80.2 kg)   SpO2 95%   BMI 25.37 kg/m   Wt Readings from Last 3 Encounters:  05/13/17 176 lb 12.8 oz (80.2 kg)  05/07/17 178 lb 8 oz (81 kg)  03/08/17 175 lb (79.4 kg)    Physical Exam    General:  Well-developed,well-nourished,in no acute distress; alert,appropriate and cooperative throughout examination Head:  normocephalic and atraumatic.   Eyes:  vision grossly intact, PERRL Ears:  R ear normal and L ear normal externally,  TMs clear bilaterally Nose:  no external deformity.   Mouth:  good dentition.   Neck:  No deformities, masses, or tenderness noted. Breasts:  No mass, nodules, thickening, tenderness, bulging, retraction, inflamation, nipple discharge or skin changes noted.   Lungs:  Normal respiratory effort, chest expands symmetrically. Lungs are clear to auscultation, no crackles or wheezes. Heart:  Normal rate and regular rhythm. S1 and S2 normal without gallop, murmur, click, rub or other extra sounds. Abdomen:  Bowel sounds positive,abdomen soft and non-tender without masses, organomegaly or hernias noted. Msk:  No deformity or scoliosis noted of thoracic or lumbar spine.   Extremities:  No clubbing, cyanosis, edema, or deformity noted with normal full range of motion of all joints.   Neurologic:  alert & oriented X3 and gait normal.   Skin:  Intact without suspicious lesions or rashes Cervical Nodes:  No lymphadenopathy noted Axillary Nodes:  No palpable lymphadenopathy Psych:  Cognition and judgment appear intact. Alert and cooperative with normal attention span and concentration. No apparent delusions, illusions, hallucinations       Assessment & Plan:   Cerebrovascular accident (CVA) due to embolism of left middle cerebral artery ([place])  Well woman exam  Paroxysmal atrial fibrillation ([place])  Osteoporosis with current pathological fracture with malunion, unspecified osteoporosis type, subsequent encounter  Need for influenza vaccination - Plan: Flu Vaccine QUAD 6+ mos PF IM (Fluarix Quad PF)  ANXIETY DEPRESSION No Follow-up on file.

## 2017-05-20 ENCOUNTER — Telehealth: Payer: Self-pay | Admitting: Family Medicine

## 2017-05-20 ENCOUNTER — Other Ambulatory Visit: Payer: Self-pay | Admitting: Family Medicine

## 2017-05-20 ENCOUNTER — Other Ambulatory Visit (INDEPENDENT_AMBULATORY_CARE_PROVIDER_SITE_OTHER): Payer: Medicare Other

## 2017-05-20 DIAGNOSIS — Z7983 Long term (current) use of bisphosphonates: Secondary | ICD-10-CM | POA: Diagnosis not present

## 2017-05-20 LAB — CALCIUM: CALCIUM: 9.3 mg/dL (ref 8.4–10.5)

## 2017-05-20 NOTE — Telephone Encounter (Signed)
Opened in error

## 2017-05-20 NOTE — Telephone Encounter (Signed)
Spoke to pt and scheduled calcium lab and prolia

## 2017-05-20 NOTE — Telephone Encounter (Signed)
Verification of benefits have been processed and an approval has been received for pts prolia injection. Pts estimated cost are appx $250. This is only an estimate and cannot be confirmed until benefits are paid. Please advise pt and schedule if needed. If scheduled, once the injection is received, pls contact me back with the date it was received so that I am able to update prolia folder. thanks   If pt is agreeable to fee, ok to schedule Ca lab and prolia injection after 05/22/17

## 2017-05-26 ENCOUNTER — Ambulatory Visit (INDEPENDENT_AMBULATORY_CARE_PROVIDER_SITE_OTHER): Payer: Medicare Other

## 2017-05-26 DIAGNOSIS — M810 Age-related osteoporosis without current pathological fracture: Secondary | ICD-10-CM | POA: Diagnosis not present

## 2017-05-26 MED ORDER — DENOSUMAB 60 MG/ML ~~LOC~~ SOLN
60.0000 mg | Freq: Once | SUBCUTANEOUS | Status: AC
  Administered 2017-05-26: 60 mg via SUBCUTANEOUS

## 2017-05-26 MED ORDER — DENOSUMAB 60 MG/ML ~~LOC~~ SOLN: 60.0000 mg | Freq: Once | SUBCUTANEOUS | 0 refills | Status: DC

## 2017-05-28 DIAGNOSIS — G4733 Obstructive sleep apnea (adult) (pediatric): Secondary | ICD-10-CM | POA: Diagnosis not present

## 2017-05-31 DIAGNOSIS — M5136 Other intervertebral disc degeneration, lumbar region: Secondary | ICD-10-CM | POA: Diagnosis not present

## 2017-05-31 DIAGNOSIS — M5416 Radiculopathy, lumbar region: Secondary | ICD-10-CM | POA: Diagnosis not present

## 2017-06-02 ENCOUNTER — Telehealth: Payer: Self-pay | Admitting: Neurology

## 2017-06-02 NOTE — Telephone Encounter (Signed)
Called that patient to make her aware that she needed to schedule a follow up. It needed to be before Jul 30 2017. We had one opening and that was on Jun 21 2017 at 10:30 am. Pt accepted this apt but would like to be put on the wait list for if an afternoon apt comes up. I informed her I would make her aware if this was to happen.

## 2017-06-03 ENCOUNTER — Other Ambulatory Visit: Payer: Self-pay | Admitting: Neurology

## 2017-06-03 DIAGNOSIS — G4733 Obstructive sleep apnea (adult) (pediatric): Secondary | ICD-10-CM

## 2017-06-04 ENCOUNTER — Ambulatory Visit (INDEPENDENT_AMBULATORY_CARE_PROVIDER_SITE_OTHER): Payer: Medicare Other | Admitting: Family Medicine

## 2017-06-04 ENCOUNTER — Encounter: Payer: Self-pay | Admitting: Family Medicine

## 2017-06-04 VITALS — BP 101/63 | HR 59 | Temp 98.3°F | Wt 176.2 lb

## 2017-06-04 DIAGNOSIS — Z7689 Persons encountering health services in other specified circumstances: Secondary | ICD-10-CM

## 2017-06-04 DIAGNOSIS — F341 Dysthymic disorder: Secondary | ICD-10-CM | POA: Diagnosis not present

## 2017-06-04 DIAGNOSIS — G8929 Other chronic pain: Secondary | ICD-10-CM

## 2017-06-04 DIAGNOSIS — G4734 Idiopathic sleep related nonobstructive alveolar hypoventilation: Secondary | ICD-10-CM | POA: Diagnosis not present

## 2017-06-04 DIAGNOSIS — M545 Low back pain: Secondary | ICD-10-CM | POA: Diagnosis not present

## 2017-06-04 NOTE — Patient Instructions (Signed)
It was a pleasure to meet you today! I look forward to partnering with you for your health care needs

## 2017-06-04 NOTE — Progress Notes (Signed)
Subjective:    Patient ID: Monica Whitaker, female    DOB: 04/25/1946, 71 y.o.   MRN: [id number]  HPI This is a 71 yo female who is transferring from Dr. Deborra Medina. She has several things she would like to discuss, but no pressing concerns. She had AWV and CPE recently.  Has been having recent back pain and had steroid injections. Feels a little better. Is slowly increasing activity. Gained 10 pounds prior to starting prolia and would like to loose it with [employer].  Is currently using CPAP, less daytime drowsiness.  Struggles with mood- difficulty watching her husband's health and abilities decline and she has added responsibilities. Has several good friends, but is somewhat limited socially by her husband's ability to get about.    Past Medical History:  Diagnosis Date  . Anxiety    controlled with meds  . Atrial fibrillation ([name])    paroxysmal, failed medical therapy with flecainide,  NSVT with tikosyn  . Bruises easily   . CVA (cerebral vascular accident) ([place]) 12/2007  . Depression    medically controlled   . Low blood pressure    no falls, but if gets up to fast  . Stroke ([place]) 7 years ago   Lasting effects on balance, different personality.  . Thyroid disease   . 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 ATRIAL FIBRILLATION ABLATION  10/18/12   PVI by Dr Rayann Heman  . ATRIAL FIBRILLATION ABLATION N/A 10/18/2012   Procedure: ATRIAL FIBRILLATION ABLATION;  Surgeon: Thompson Grayer, MD;  Location[facility] CATH LAB;  Service: Cardiovascular;  Laterality: N/A;  . EYE SURGERY     on L/R eye, macular hole   . OPEN REDUCTION INTERNAL FIXATION (ORIF) DISTAL RADIAL FRACTURE Right 11/15/2014   Procedure: OPEN REDUCTION INTERNAL FIXATION (ORIF) RIGHT DISTAL RADIAL FRACTURE WITH ALLOGRAFT BONE GRAFT;  Surgeon: Roseanne Kaufman, MD;  Location: [place];  Service: Orthopedics;  Laterality: Right;    . TEE WITHOUT CARDIOVERSION N/A 10/18/2012   Procedure: TRANSESOPHAGEAL ECHOCARDIOGRAM (TEE);  Surgeon: Lelon Perla, MD;  Location: [facility] ENDOSCOPY;  Service: Cardiovascular;  Laterality: N/A;  Pre-Ablation at 12pm  . TONSILLECTOMY    . VAGINAL HYSTERECTOMY     Family History  Problem Relation Age of Onset  . Multiple sclerosis Father    Social History  Substance Use Topics  . Smoking status: Never Smoker  . Smokeless tobacco: Never Used  . Alcohol use 0.5 oz/week    1 Standard drinks or equivalent per week     Comment: regular      Review of Systems Per HPI    Objective:   Physical Exam  Constitutional: She is oriented to person, place, and time. She appears well-developed and well-nourished.  HENT:  Head: Normocephalic and atraumatic.  Cardiovascular: Normal rate.   Pulmonary/Chest: Effort normal.  Neurological: She is alert and oriented to person, place, and time.  Skin: Skin is warm and dry.  Psychiatric: She has a normal mood and affect. Her behavior is normal. Judgment and thought content normal.  Vitals reviewed.     BP (!) 80/52 (BP Location: Left Arm, Patient Position: Sitting, Cuff Size: Normal)   Pulse (!) 59   Temp 98.3 F (36.8 C) (Oral)   Wt 176 lb 4 oz (79.9 kg)   SpO2 96%   BMI 25.29 kg/m  BP Readings from Last 3 Encounters:  06/04/17 (!) 80/52  05/13/17 116/82  05/07/17 102/80  BP: 101/63 - recheck      Assessment & Plan:  1. Encounter to establish care - Discussed and encouraged healthy lifestyle choices- adequate sleep, regular exercise, stress management and healthy food choices.   2. Hypoxia, sleep related - feels better with CPAP, encouraged nightly use  3. ANXIETY DEPRESSION - discussed importance of continue exercise, social support  4. Chronic low back pain, unspecified back pain laterality, with sciatica presence unspecified - discussed chronic nature of back pain and encouraged activity as tolerated   Clarene Reamer,  FNP-BC  [facility] [place]  06/04/2017 3:25 PM

## 2017-06-15 ENCOUNTER — Encounter: Payer: Self-pay | Admitting: Family Medicine

## 2017-06-16 ENCOUNTER — Encounter: Payer: Self-pay | Admitting: Family Medicine

## 2017-06-16 NOTE — Progress Notes (Signed)
Letter completed per patient request.  

## 2017-06-18 DIAGNOSIS — L658 Other specified nonscarring hair loss: Secondary | ICD-10-CM | POA: Diagnosis not present

## 2017-06-18 DIAGNOSIS — L218 Other seborrheic dermatitis: Secondary | ICD-10-CM | POA: Diagnosis not present

## 2017-06-18 DIAGNOSIS — L720 Epidermal cyst: Secondary | ICD-10-CM | POA: Diagnosis not present

## 2017-06-18 DIAGNOSIS — L603 Nail dystrophy: Secondary | ICD-10-CM | POA: Diagnosis not present

## 2017-06-21 ENCOUNTER — Ambulatory Visit: Payer: Medicare Other | Admitting: Neurology

## 2017-06-21 ENCOUNTER — Encounter: Payer: Self-pay | Admitting: Neurology

## 2017-06-21 VITALS — BP 109/64 | HR 61 | Ht 70.0 in | Wt 174.0 lb

## 2017-06-21 DIAGNOSIS — Z9989 Dependence on other enabling machines and devices: Secondary | ICD-10-CM

## 2017-06-21 DIAGNOSIS — G4731 Primary central sleep apnea: Secondary | ICD-10-CM | POA: Diagnosis not present

## 2017-06-21 DIAGNOSIS — G4733 Obstructive sleep apnea (adult) (pediatric): Secondary | ICD-10-CM

## 2017-06-21 DIAGNOSIS — I481 Persistent atrial fibrillation: Secondary | ICD-10-CM | POA: Diagnosis not present

## 2017-06-21 DIAGNOSIS — G4734 Idiopathic sleep related nonobstructive alveolar hypoventilation: Secondary | ICD-10-CM | POA: Diagnosis not present

## 2017-06-21 DIAGNOSIS — I4819 Other persistent atrial fibrillation: Secondary | ICD-10-CM | POA: Insufficient documentation

## 2017-06-21 DIAGNOSIS — G4739 Other sleep apnea: Secondary | ICD-10-CM | POA: Insufficient documentation

## 2017-06-21 NOTE — Progress Notes (Signed)
SLEEP MEDICINE CLINIC   Provider:  Larey Seat, [place] D  Primary Care Physician:  Lucille Passy, MD   Referring Provider: Lucille Passy, MD    Chief Complaint  Patient presents with  . Follow-up    pt alone, rm 10. pt states that CPAP is working ok.     HPI:  Monica Whitaker is a 71 y.o. female , seen here as in a referral/ revisit  from Dr. Deborra Medina for An evaluation at [facility] sleep of [place] for intermittent hypersomnia. Mrs. Dicamillo carries a diagnosis of anxiety treated by Dr. Charlott Holler, paroxysmal atrial fibrillation for which she has undergone 2 ablations unsuccessfully, failed medical therapy with flecainide and is now on Tikosyn. She had a stroke in May 2009 the stroke was evident when she woke up in the morning she was not a TPA candidate, it affected the left brain. She usually suffers from lower blood pressure and is sometimes presyncopal, she also noticed that her stroke affected her balance and her personality, she has a history of thyroid disease and ventricular tachycardia, she received the diagnosis at [facility]. The patient has followed a holistic medicine man in [place], who declared that she had a disorder of the hypothalamus. She received treatment through him that would abate her symptoms for 12-18 months and that would return to [facility] for a new adjustment in her medication.  Chief complaint according to patient : " Running low grade fever for one month and being very sleepy " - she considers 97 degrees a fever -  Sleep habits are as follows:Sluggish in evenings- makes all appointments in AM and exercises in AM. She sleeps from 10 PM and falls asleep promptly, stays asleep 3-4 hours, has one restroom break, goes back to sleep- rise at 7 AM.  She does not need an alarm. Her quiet this time and cognitively and physically most sustained activity time is in the morning. She will prepare cup of coffee return morning newspaper, schedules her day.  Sleep medical history and family sleep  history: The patient feels that she has always been achieving sound sleep, she never suffered from prolonged insomnia, hypersomnia is only present when her temperature is elevated. Her low-grade temperature rise has been declared a hypothalamic symptom.  Social history: raised by grandparents, mother died at age 35- when she was 70. 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 No children, but pets .  1 cup of coffee in AM, ETOH one drink a day, Tobacco - never used. No passive exposure.     I have the pleasure of meeting Monica Whitaker on 06/21/17, the patient has undergone a home sleep test performed on March 22, 2017.  Her medical history and diagnoses were listed at the top.  She was diagnosed with fairly mild sleep apnea with prolonged associated hypoxia her heart rate varied greatly at night likely due to the presence of atrial fibrillation hypersomnia which would be explained by these findings.  AHI was 6.5 her RDI was 7.7/h of sleep, average oxygen saturation was normal at 90% but the desaturation time at or below 88% was 55 minutes total which is elevated.  Heart rate varied between 54 beats and 205 bpm.  I ordered auto- titration of CPAP between 5 and 12  Cm water pressure.  Mrs. Crilly reports that she has no difficulties using the CPAP, her compliance report was 100% 4 days and time and concluded on 16 June 2017, she has a 3 cm EPR  and average use of time is 6 hours and 27 minutes, the 95th percentile pressure is 9 cm of water, but the residual AHI was 14.4 which is still very high and corresponds with a high number of central apnea.  Central apneas alone made up to 9.4 apneas per hour.  Given that the patient's baseline AHI was lower than what she now experiences on CPAP I think that we have introduced central apneas emerging under CPAP treatment.  For this reason I will curb the AutoSet settings to a maximum setting of 9 cmH2O and which she will remain on 3 cm expiratory  pressure relief. 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  Usually findings of a central apnea emerging under treatment require an in lab titration. She has adult and healthy patient.   Review of Systems: Out of a complete 14 system review, the patient complains of only the following symptoms, and all other reviewed systems are negative. Ringing in the ears, fatigue, ankle edema  joint pain, low grade temperature. Easy bruising and bleeding. Depression and anxiety. Snoring  Epworth score 4 point; Fatigue severity score 47 point,  depression score 3/15 .   Social History   Socioeconomic History  . Marital status: Married    Spouse name: Not on file  . Number of children: 0  . Years of education: Not on file  . Highest education level: Not on file  Social Needs  . Financial resource strain: Not on file  . Food insecurity - worry: Not on file  . Food insecurity - inability: Not on file  . Transportation needs - medical: Not on file  . Transportation needs - non-medical: Not on file  Occupational History  . Occupation: Retired    Fish farm manager: RETIRED  Tobacco Use  . Smoking status: Never Smoker  . Smokeless tobacco: Never Used  Substance and Sexual Activity  . Alcohol use: Yes    Alcohol/week: 0.5 oz    Types: 1 Standard drinks or equivalent per week    Comment: regular  . Drug use: No  . Sexual activity: No  Other Topics Concern  . Not on file  Social History Narrative   Lives in [place] with her husband.  No children.  Former Psychologist, prison and probation services   Enjoys exercise- water classes, yoga Editor, commissioning.       Has a living will-    Would desire CPR   Would not want prolonged life support if futile.    Family History  Problem Relation Age of Onset  . Multiple sclerosis Father     Past Medical History:  Diagnosis Date  . Anxiety    controlled with meds  . Atrial fibrillation ([name])    paroxysmal, failed  medical therapy with flecainide,  NSVT with tikosyn  . Bruises easily   . CVA (cerebral vascular accident) ([place]) 12/2007  . Depression    medically controlled   . Low blood pressure    no falls, but if gets up to fast  . Stroke ([place]) 7 years ago   Lasting effects on balance, different personality.  . Thyroid disease   . 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 ATRIAL FIBRILLATION ABLATION  10/18/12   PVI by Dr Rayann Heman  . ATRIAL FIBRILLATION ABLATION N/A 10/18/2012   Performed by  Thompson Grayer, MD at [facility] CATH LAB  . EYE SURGERY     on L/R eye, macular hole   . OPEN REDUCTION INTERNAL FIXATION (ORIF) RIGHT DISTAL RADIAL FRACTURE WITH ALLOGRAFT BONE GRAFT Right 11/15/2014   Performed by Roseanne Kaufman, MD at [facility]  . TONSILLECTOMY    . TRANSESOPHAGEAL ECHOCARDIOGRAM (TEE) N/A 10/18/2012   Performed by Lelon Perla, MD at [place]  . VAGINAL HYSTERECTOMY      Current Outpatient Medications  Medication Sig Dispense Refill  . acetaminophen (TYLENOL) 500 MG tablet Take 2 tablets by mouth every 6 hours as needed for pain    . ARIPiprazole (ABILIFY) 10 MG tablet Take 10 mg by mouth daily. Take 1.5 tab    . calcium carbonate (OS-CAL) 600 MG TABS Take 600 mg by mouth daily with breakfast.     . cholecalciferol (VITAMIN D) 1000 UNITS tablet Take 2,000 Units by mouth daily.     Marland Kitchen denosumab (PROLIA) 60 MG/ML SOLN injection Inject into the skin.    Marland Kitchen dextroamphetamine (DEXTROSTAT) 5 MG tablet Take 5 mg by mouth daily.     Marland Kitchen diltiazem (CARDIZEM CD) 240 MG 24 hr capsule Take 1 capsule (240 mg total) by mouth daily. Pt takes once extra tablet daily as needed for AFIB 90 capsule 3  . diltiazem (CARDIZEM) 30 MG tablet Ttake 1 tablet every 4 hours AS NEEDED for rapid afib heart rate over 100 45 tablet 1  . DULoxetine (CYMBALTA) 30 MG capsule Take 30 mg by mouth as directed. TAKE 2 CAPSULES  EVERY MORNING AND 1 CAPSULE AT NIGHT    . ELIQUIS 5 MG TABS tablet TAKE 1 TABLET BY MOUTH TWICE DAILY 180 tablet 1  . flecainide (TAMBOCOR) 100 MG tablet Take 1 tablet (100 mg total) by mouth 2 (two) times daily. 180 tablet 3  . furosemide (LASIX) 20 MG tablet Take 0.5 tablets (10 mg total) by mouth as needed for edema. 10 tablet 3  . levothyroxine (SYNTHROID, LEVOTHROID) 25 MCG tablet TAKE 1 TABLET BY MOUTH EVERY DAY 90 tablet 3  . Multiple Vitamin (MULTIVITAMIN) tablet Take 1 tablet by mouth daily.      . Oral Electrolytes (BUFFERED SALT PO) Take 1 capsule by mouth daily as needed (Electrolyte/Salt supplement).     . Timolol Maleate (TIMOPTIC OP) Place 1 drop into the left eye daily.     . vitamin B-12 (CYANOCOBALAMIN) 1000 MCG tablet Take 1,000 mcg by mouth daily.    . vitamin C (ASCORBIC ACID) 500 MG tablet Take 500 mg by mouth daily.    . vitamin E 400 UNIT capsule Take 400 Units by mouth daily.       No current facility-administered medications for this visit.     Allergies as of 06/21/2017 - Review Complete 06/04/2017  Allergen Reaction Noted  . Darvon Other (See Comments) 05/20/2011  . Propoxyphene Other (See Comments) 05/30/2014  . Propoxyphene n-acetaminophen Other (See Comments) 03/11/2010  . Levofloxacin Anxiety and Other (See Comments) 03/11/2010    Vitals: BP 109/64   Pulse 61   Ht 5\' 10"  (1.778 m)   Wt 174 lb (78.9 kg)   BMI 24.97 kg/m  Last Weight:  Wt Readings from Last 1 Encounters:  06/21/17 174 lb (78.9 kg)   GMW:NUUV mass index is 24.97 kg/m.     Last Height:   Ht Readings from Last 1 Encounters:  06/21/17 5\' 10"  (1.778 m)    Physical exam:  General: The patient is  awake, alert and appears not in acute distress. The patient is well groomed. Head: Normocephalic, atraumatic. Neck is supple. Mallampati 3,  neck circumference 13.25 . Nasal airflow -  TMJ click is evident left over right . Retrognathia is seen. She use a night guard - bruxism  marks. Cardiovascular:  Regular rate and rhythm, without  murmurs or carotid bruit, and without distended neck veins. Respiratory: Lungs are clear to auscultation. Skin:  Without evidence of edema, or rash Trunk: BMI is 25. The patient's posture is erect -   Neurologic exam : The patient is awake and alert, oriented to place and time.  Her speech is measured, very articulate and eloquent.   MMSE - Mini Mental State Exam 05/07/2017 05/06/2016  Orientation to time 5 5  Orientation to Place 5 5  Registration 3 3  Attention/ Calculation 0 0  Recall 3 2  Recall-comments - pt was unable to recall 1 of 3 words  Language- name 2 objects 0 0  Language- repeat 1 1  Language- follow 3 step command 3 3  Language- read & follow direction 0 0  Write a sentence 0 0  Copy design 0 0  Total score 20 19    Attention span & concentration ability appears normal. Speech is fluent,  without  dysarthria, dysphonia or aphasia.  Mood and affect are anxious . Cranial nerves: Pupils are equal and briskly reactive to light. Extraocular movements  in vertical and horizontal planes intact and without nystagmus. Visual fields by finger perimetry are intact.Hearing to finger rub intact.  Facial motor strength is symmetric and tongue and uvula move midline. Shoulder shrug was asymmetrical.  Motor exam:  The patient keeps her right shoulder elevated or walking she does have a slight circumferential gait with a right leg, her right arm remains flexed as she walks. Sensory:  Fine touch, pinprick and vibration were tested in all extremities. Proprioception tested in the upper extremities was normal. Coordination: Rapid alternating movements in the fingers/hands was normal. Finger-to-nose maneuver  normal without evidence of ataxia, dysmetria - there is a right sided tremor- since the stroke.  Gait and station: Patient walks without assistive device. Tandem gait deferred.  Deep tendon reflexes: in the  upper and lower right  extremities are more brisk than the left - no up going toe.   Assessment:  After physical and neurologic examination, review of laboratory studies,  Personal review of imaging studies, reports of other /same  Imaging studies, results of polysomnography and / or neurophysiology testing and pre-existing records as far as provided in visit., my assessment is   1) I cannot help this patient in regards to the presumed hypothalamic dysfunction- This would be an endocrinology work up. She does not report circadian problems.    2) Old stroke with residual hemiparesis.   3) She has mild OSA with a higher RDI ( indicate of snoring) , and nocturnal atrial fibrillation, hypoxemia. I started her on auto titration. She developed treatment emergent central apneas and still cannot come to the lab for an attended titration.  Adult step daughters (2) not close by- she needs to stay with her husband. I will reduce the Upper pressure window.     The patient was advised of the nature of the diagnosed disorder , the treatment options and the  risks for general health and wellness arising from not treating the condition.   I spent more than 30 minutes of face to face time with the patient. Greater than  50% of time was spent in counseling and coordination of care. We have discussed the diagnosis and differential and I answered the patient's questions.       CC DR Lucille Passy.   Larey Seat, MD 29/19/1660, 60:04 AM  Certified in Neurology by ABPN Certified in [place] by [facility] [address] [place], [place] [address]

## 2017-06-28 DIAGNOSIS — G4733 Obstructive sleep apnea (adult) (pediatric): Secondary | ICD-10-CM | POA: Diagnosis not present

## 2017-07-19 ENCOUNTER — Other Ambulatory Visit: Payer: Self-pay | Admitting: Family Medicine

## 2017-07-19 DIAGNOSIS — Z1231 Encounter for screening mammogram for malignant neoplasm of breast: Secondary | ICD-10-CM

## 2017-07-22 ENCOUNTER — Ambulatory Visit
Admission: RE | Admit: 2017-07-22 | Discharge: 2017-07-22 | Disposition: A | Discharge status: Home / Self Care | Payer: Medicare Other | Source: Ambulatory Visit | Attending: Family Medicine | Admitting: Family Medicine

## 2017-07-22 DIAGNOSIS — Z1231 Encounter for screening mammogram for malignant neoplasm of breast: Secondary | ICD-10-CM

## 2017-07-28 DIAGNOSIS — G4733 Obstructive sleep apnea (adult) (pediatric): Secondary | ICD-10-CM | POA: Diagnosis not present

## 2017-08-16 ENCOUNTER — Telehealth: Payer: Self-pay | Admitting: Family Medicine

## 2017-08-16 NOTE — Telephone Encounter (Signed)
Noted  

## 2017-08-16 NOTE — Telephone Encounter (Signed)
[facility] called to advise the manufacturer has changed for pt's Levothyroxine meds due to unavailability with previous manufacturer. New manufacturer is [employer]. Questions call [phone]

## 2017-08-17 DIAGNOSIS — H40052 Ocular hypertension, left eye: Secondary | ICD-10-CM | POA: Diagnosis not present

## 2017-08-20 ENCOUNTER — Encounter: Payer: Self-pay | Admitting: Internal Medicine

## 2017-08-20 ENCOUNTER — Ambulatory Visit: Payer: Medicare Other | Admitting: Internal Medicine

## 2017-08-20 ENCOUNTER — Ambulatory Visit: Payer: Medicare Other | Admitting: Family Medicine

## 2017-08-20 ENCOUNTER — Encounter: Payer: Self-pay | Admitting: Family Medicine

## 2017-08-20 VITALS — BP 126/68 | HR 65 | Temp 97.8°F | Wt 170.8 lb

## 2017-08-20 VITALS — BP 98/64 | HR 74 | Ht 70.0 in | Wt 170.6 lb

## 2017-08-20 DIAGNOSIS — R32 Unspecified urinary incontinence: Secondary | ICD-10-CM | POA: Diagnosis not present

## 2017-08-20 DIAGNOSIS — I480 Paroxysmal atrial fibrillation: Secondary | ICD-10-CM | POA: Diagnosis not present

## 2017-08-20 DIAGNOSIS — R300 Dysuria: Secondary | ICD-10-CM | POA: Diagnosis not present

## 2017-08-20 DIAGNOSIS — R5383 Other fatigue: Secondary | ICD-10-CM | POA: Diagnosis not present

## 2017-08-20 DIAGNOSIS — R350 Frequency of micturition: Secondary | ICD-10-CM

## 2017-08-20 LAB — POC URINALSYSI DIPSTICK (AUTOMATED)
Bilirubin, UA: NEGATIVE
Blood, UA: NEGATIVE
Glucose, UA: NEGATIVE
Ketones, UA: NEGATIVE
Leukocytes, UA: NEGATIVE
NITRITE UA: NEGATIVE
PH UA: 6 (ref 5.0–8.0)
Protein, UA: NEGATIVE
Spec Grav, UA: 1.02 (ref 1.010–1.025)
UROBILINOGEN UA: 0.2 U/dL

## 2017-08-20 NOTE — Progress Notes (Signed)
PCP: Elby Beck, FNP   Primary EP: Dr Rayann Heman  Monica Whitaker is a 72 y.o. female who presents today for routine electrophysiology followup.  Since last being seen in our clinic, the patient reports doing reasonably well.  Her husband continues to decline and she is having trouble with this.  She is tired today.  BP is low.   Today, she denies symptoms of palpitations, chest pain, shortness of breath,  lower extremity edema, dizziness, presyncope, or syncope.  The patient is otherwise without complaint today.   Past Medical History:  Diagnosis Date  . Anxiety    controlled with meds  . Atrial fibrillation ([name])    paroxysmal, failed medical therapy with flecainide,  NSVT with tikosyn  . Bruises easily   . CVA (cerebral vascular accident) ([place]) 12/2007  . Depression    medically controlled   . Low blood pressure    no falls, but if gets up to fast  . Stroke ([place]) 7 years ago   Lasting effects on balance, different personality.  . Thyroid disease   . 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 ATRIAL FIBRILLATION ABLATION  10/18/12   PVI by Dr Rayann Heman  . ATRIAL FIBRILLATION ABLATION N/A 10/18/2012   Procedure: ATRIAL FIBRILLATION ABLATION;  Surgeon: Thompson Grayer, MD;  Location: [facility] CATH LAB;  Service: Cardiovascular;  Laterality: N/A;  . EYE SURGERY     on L/R eye, macular hole   . OPEN REDUCTION INTERNAL FIXATION (ORIF) DISTAL RADIAL FRACTURE Right 11/15/2014   Procedure: OPEN REDUCTION INTERNAL FIXATION (ORIF) RIGHT DISTAL RADIAL FRACTURE WITH ALLOGRAFT BONE GRAFT;  Surgeon: Roseanne Kaufman, MD;  Location: [place];  Service: Orthopedics;  Laterality: Right;  . TEE WITHOUT CARDIOVERSION N/A 10/18/2012   Procedure: TRANSESOPHAGEAL ECHOCARDIOGRAM (TEE);  Surgeon: Lelon Perla, MD;  [facility] ENDOSCOPY;  Service: Cardiovascular;  Laterality: N/A;  Pre-Ablation at 12pm  .  TONSILLECTOMY    . VAGINAL HYSTERECTOMY      ROS- all systems are reviewed and negatives except as per HPI above  Current Outpatient Medications  Medication Sig Dispense Refill  . acetaminophen (TYLENOL) 500 MG tablet Take 2 tablets by mouth every 6 hours as needed for pain    . ARIPiprazole (ABILIFY) 10 MG tablet Take 10 mg by mouth daily. Take 1.5 tab    . calcium carbonate (OS-CAL) 600 MG TABS Take 600 mg by mouth daily with breakfast.     . cholecalciferol (VITAMIN D) 1000 UNITS tablet Take 2,000 Units by mouth daily.     Marland Kitchen denosumab (PROLIA) 60 MG/ML SOLN injection Inject into the skin.    Marland Kitchen dextroamphetamine (DEXTROSTAT) 5 MG tablet Take 5 mg by mouth daily.     Marland Kitchen diltiazem (CARDIZEM CD) 240 MG 24 hr capsule Take 1 capsule (240 mg total) by mouth daily. Pt takes once extra tablet daily as needed for AFIB 90 capsule 3  . diltiazem (CARDIZEM) 30 MG tablet Ttake 1 tablet every 4 hours AS NEEDED for rapid afib heart rate over 100 45 tablet 1  . DULoxetine (CYMBALTA) 30 MG capsule Take 30 mg by mouth as directed. TAKE 2 CAPSULES EVERY MORNING AND 1 CAPSULE AT NIGHT    . ELIQUIS 5 MG TABS tablet TAKE 1 TABLET BY MOUTH TWICE DAILY 180 tablet 1  . flecainide (TAMBOCOR) 100 MG tablet Take 1 tablet (100 mg total)  by mouth 2 (two) times daily. 180 tablet 3  . furosemide (LASIX) 20 MG tablet Take 0.5 tablets (10 mg total) by mouth as needed for edema. 10 tablet 3  . levothyroxine (SYNTHROID, LEVOTHROID) 25 MCG tablet TAKE 1 TABLET BY MOUTH EVERY DAY 90 tablet 3  . Multiple Vitamin (MULTIVITAMIN) tablet Take 1 tablet by mouth daily.      . Oral Electrolytes (BUFFERED SALT PO) Take 1 capsule by mouth daily as needed (Electrolyte/Salt supplement).     . Timolol Maleate (TIMOPTIC OP) Place 1 drop into the left eye daily.     . vitamin B-12 (CYANOCOBALAMIN) 1000 MCG tablet Take 1,000 mcg by mouth daily.    . vitamin C (ASCORBIC ACID) 500 MG tablet Take 500 mg by mouth daily.    . vitamin E 400 UNIT  capsule Take 400 Units by mouth daily.       No current facility-administered medications for this visit.     Physical Exam: Vitals:   08/20/17 1528  BP: 98/64  Pulse: 74  SpO2: 94%  Weight: 170 lb 9.6 oz (77.4 kg)  Height: 5\' 10"  (1.778 m)    GEN- The patient is well appearing, alert and oriented x 3 today.   Head- normocephalic, atraumatic Eyes-  Sclera clear, conjunctiva pink Ears- hearing intact Oropharynx- clear Lungs- Clear to ausculation bilaterally, normal work of breathing Heart- Regular rate and rhythm, no murmurs, rubs or gallops, PMI not laterally displaced GI- soft, NT, ND, + BS Extremities- no clubbing, cyanosis, or edema  EKG tracing ordered today is personally reviewed and shows sinus rhythm 62 bpm,   Assessment and Plan:  1. Persistent atrail fibrillation Doing well chads2vasc score is 4. No changes today Cbc, bmet today given eliquis  2. Hypotension Tired but no other symptoms Bmet, cbc, tsh today   Return to see Butch Penny in the AF clinic in 6 months I will see in a year  Thompson Grayer MD, [facility] 08/20/2017 4:00 PM

## 2017-08-20 NOTE — Patient Instructions (Signed)
Your urine looks good, I am sending for a culture and will let you know early next week  Can keep taking cranberry pills

## 2017-08-20 NOTE — Progress Notes (Signed)
Subjective:    Patient ID: Monica Whitaker, female    DOB: [birth year], 72 y.o.   MRN: [id number]  HPI This is a 72 yo female who presents today with dysuria and urinary frequency x 2 days. Had an episode of urinary incontinence yesterday. No fever. Took a cranberry pill. Less frequency and dysuria this morning. Felt a little off yesterday. No fever/chills, no back or abdominal pain, no nausea or vomiting. No vaginal discharge or irritation. Wears a pad most of the time.    Past Medical History:  Diagnosis Date  . Anxiety    controlled with meds  . Atrial fibrillation ([name])    paroxysmal, failed medical therapy with flecainide,  NSVT with tikosyn  . Bruises easily   . CVA (cerebral vascular accident) ([place]) 12/2007  . Depression    medically controlled   . Low blood pressure    no falls, but if gets up to fast  . Stroke ([place]) 7 years ago   Lasting effects on balance, different personality.  . Thyroid disease   . 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 ATRIAL FIBRILLATION ABLATION  10/18/12   PVI by Dr Rayann Heman  . ATRIAL FIBRILLATION ABLATION N/A 10/18/2012   Procedure: ATRIAL FIBRILLATION ABLATION;  Surgeon: Thompson Grayer, MD;  Location: [facility] CATH LAB;  Service: Cardiovascular;  Laterality: N/A;  . EYE SURGERY     on L/R eye, macular hole   . OPEN REDUCTION INTERNAL FIXATION (ORIF) DISTAL RADIAL FRACTURE Right 11/15/2014   Procedure: OPEN REDUCTION INTERNAL FIXATION (ORIF) RIGHT DISTAL RADIAL FRACTURE WITH ALLOGRAFT BONE GRAFT;  Surgeon: Roseanne Kaufman, MD;  Location: [place];  Service: Orthopedics;  Laterality: Right;  . TEE WITHOUT CARDIOVERSION N/A 10/18/2012   Procedure: TRANSESOPHAGEAL ECHOCARDIOGRAM (TEE);  Surgeon: Lelon Perla, MD;  Location: [facility] ENDOSCOPY;  Service: Cardiovascular;  Laterality: N/A;  Pre-Ablation at 12pm  . TONSILLECTOMY    . VAGINAL HYSTERECTOMY      Family History  Problem Relation Age of Onset  . Multiple sclerosis Father   . Breast cancer Neg Hx    Social History   Tobacco Use  . Smoking status: Never Smoker  . Smokeless tobacco: Never Used  Substance Use Topics  . Alcohol use: Yes    Alcohol/week: 0.5 oz    Types: 1 Standard drinks or equivalent per week    Comment: regular  . Drug use: No      Review of Systems Per HPI    Objective:   Physical Exam Physical Exam  Constitutional: She is oriented to person, place, and time. She appears well-developed and well-nourished. No distress.  HENT:  Head: Normocephalic and atraumatic.  Cardiovascular: Normal rate, regular rhythm and normal heart sounds.   Pulmonary/Chest: Effort normal and breath sounds normal.  Abdominal: Soft. She exhibits no distension. There is no tenderness. There is no rebound, no guarding and no CVA tenderness.  Neurological: She is alert and oriented to person, place, and time.  Skin: Skin is warm and dry. She is not diaphoretic.  Psychiatric: She has a normal mood and affect. Her behavior is normal. Judgment and thought content normal.  Vitals reviewed.  BP 126/68   Pulse 65   Temp 97.8 F (36.6 C) (Oral)   Wt 170 lb 12 oz (77.5 kg)   SpO2 95%   BMI 24.50 kg/m  Wt Readings from Last 3  Encounters:  08/20/17 170 lb 12 oz (77.5 kg)  06/21/17 174 lb (78.9 kg)  06/04/17 176 lb 4 oz (79.9 kg)   Results for orders placed or performed in visit on 08/20/17  POCT Urinalysis Dipstick (Automated)  Result Value Ref Range   Color, UA yellow    Clarity, UA clear    Glucose, UA neg    Bilirubin, UA neg    Ketones, UA neg    Spec Grav, UA 1.020 1.010 - 1.025   Blood, UA neg    pH, UA 6.0 5.0 - 8.0   Protein, UA neg    Urobilinogen, UA 0.2 0.2 or 1.0 E.U./dL   Nitrite, UA neg    Leukocytes, UA Negative Negative       Assessment & Plan:  1. Urinary frequency - POCT Urinalysis Dipstick (Automated) - Urine Culture - urinalysis looks ok,  will send for culture as these are unusual symptoms for her - urine mildly concentrated, encouraged her to increase fluids - RTC precautions reviewed  2. Dysuria - Urine Culture  3. Urinary incontinence, unspecified type - Urine Culture   Clarene Reamer, FNP-BC  [facility]  08/20/2017 8:33 AM

## 2017-08-20 NOTE — Patient Instructions (Signed)
Medication Instructions:  Your physician recommends that you continue on your current medications as directed. Please refer to the Current Medication list given to you today.   Labwork: Your physician recommends that you return for lab work today: TSH/CBC/BMP/   Testing/Procedures: None ordered   Follow-Up: Your physician wants you to follow-up in: 6 months with Roderic Palau, NP and 12 months with Dr Vallery Ridge will receive a reminder letter in the mail two months in advance. If you don't receive a letter, please call our office to schedule the follow-up appointment.   Any Other Special Instructions Will Be Listed Below (If Applicable).     If you need a refill on your cardiac medications before your next appointment, please call your pharmacy.

## 2017-08-21 LAB — BASIC METABOLIC PANEL
BUN/Creatinine Ratio: 23 (ref 12–28)
BUN: 17 mg/dL (ref 8–27)
CO2: 23 mmol/L (ref 20–29)
CREATININE: 0.73 mg/dL (ref 0.57–1.00)
Calcium: 8.9 mg/dL (ref 8.7–10.3)
Chloride: 99 mmol/L (ref 96–106)
GFR calc Af Amer: 96 mL/min/{1.73_m2} (ref >59)
GFR, EST NON AFRICAN AMERICAN: 84 mL/min/{1.73_m2} (ref >59)
GLUCOSE: 94 mg/dL (ref 65–99)
Potassium: 4.6 mmol/L (ref 3.5–5.2)
SODIUM: 139 mmol/L (ref 134–144)

## 2017-08-21 LAB — CBC
HEMATOCRIT: 38.1 % (ref 34.0–46.6)
HEMOGLOBIN: 13 g/dL (ref 11.1–15.9)
MCH: 30.2 pg (ref 26.6–33.0)
MCHC: 34.1 g/dL (ref 31.5–35.7)
MCV: 89 fL (ref 79–97)
Platelets: 286 10*3/uL (ref 150–379)
RBC: 4.3 x10E6/uL (ref 3.77–5.28)
RDW: 14.1 % (ref 12.3–15.4)
WBC: 5.2 10*3/uL (ref 3.4–10.8)

## 2017-08-21 LAB — URINE CULTURE
MICRO NUMBER:: 90078523
SPECIMEN QUALITY: ADEQUATE

## 2017-08-21 LAB — TSH: TSH: 1.39 u[IU]/mL (ref 0.450–4.500)

## 2017-08-23 DIAGNOSIS — H40052 Ocular hypertension, left eye: Secondary | ICD-10-CM | POA: Diagnosis not present

## 2017-08-28 DIAGNOSIS — G4733 Obstructive sleep apnea (adult) (pediatric): Secondary | ICD-10-CM | POA: Diagnosis not present

## 2017-08-31 ENCOUNTER — Ambulatory Visit: Payer: Medicare Other | Admitting: Internal Medicine

## 2017-08-31 ENCOUNTER — Encounter: Payer: Self-pay | Admitting: Internal Medicine

## 2017-08-31 VITALS — BP 110/76 | HR 76 | Temp 97.7°F | Wt 172.0 lb

## 2017-08-31 DIAGNOSIS — R350 Frequency of micturition: Secondary | ICD-10-CM | POA: Diagnosis not present

## 2017-08-31 DIAGNOSIS — N952 Postmenopausal atrophic vaginitis: Secondary | ICD-10-CM

## 2017-08-31 DIAGNOSIS — R300 Dysuria: Secondary | ICD-10-CM | POA: Diagnosis not present

## 2017-08-31 DIAGNOSIS — B373 Candidiasis of vulva and vagina: Secondary | ICD-10-CM | POA: Diagnosis not present

## 2017-08-31 DIAGNOSIS — B3731 Acute candidiasis of vulva and vagina: Secondary | ICD-10-CM

## 2017-08-31 LAB — POC URINALSYSI DIPSTICK (AUTOMATED)
Bilirubin, UA: NEGATIVE
GLUCOSE UA: NEGATIVE
Ketones, UA: NEGATIVE
LEUKOCYTES UA: NEGATIVE
NITRITE UA: NEGATIVE
Protein, UA: NEGATIVE
RBC UA: NEGATIVE
Spec Grav, UA: 1.015 (ref 1.010–1.025)
UROBILINOGEN UA: 0.2 U/dL
pH, UA: 7.5 (ref 5.0–8.0)

## 2017-08-31 MED ORDER — FLUCONAZOLE 150 MG PO TABS: 150.0000 mg | ORAL_TABLET | Freq: Once | ORAL | 0 refills | Status: AC

## 2017-08-31 NOTE — Progress Notes (Signed)
Subjective:    Patient ID: Monica Whitaker, female    DOB: Feb 04, 1946, 72 y.o.   MRN: [id number]  HPI  Pt presents to the clinic today c/o dysuria and urinary frequency. She reports this started 2-3 weeks ago. She denies urgency, blood in her urine, fever, chills, nausea or low back pain. She has increased her fluid intake but has not tried anything OTC for her symptoms. She was seen for similar symptoms 1/18, urinalysis was normal and urine culture showed contamination. She is incontinent and told to change her pads more frequently, which she has been doing with minor improvement. She is not sexually active. She has had a hysterectomy.  Review of Systems      Past Medical History:  Diagnosis Date  . Anxiety    controlled with meds  . Atrial fibrillation ([name])    paroxysmal, failed medical therapy with flecainide,  NSVT with tikosyn  . Bruises easily   . CVA (cerebral vascular accident) ([place]) 12/2007  . Depression    medically controlled   . Low blood pressure    no falls, but if gets up to fast  . Stroke ([place]) 7 years ago   Lasting effects on balance, different personality.  . Thyroid disease   . 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 acetaminophen (TYLENOL) 500 MG tablet Take 2 tablets by mouth every 6 hours as needed for pain    . ARIPiprazole (ABILIFY) 10 MG tablet Take 10 mg by mouth daily. Take 1.5 tab    . calcium carbonate (OS-CAL) 600 MG TABS Take 600 mg by mouth daily with breakfast.     . cholecalciferol (VITAMIN D) 1000 UNITS tablet Take 2,000 Units by mouth daily.     Marland Kitchen denosumab (PROLIA) 60 MG/ML SOLN injection Inject into the skin.    Marland Kitchen dextroamphetamine (DEXTROSTAT) 5 MG tablet Take 5 mg by mouth daily.     Marland Kitchen diltiazem (CARDIZEM CD) 240 MG 24 hr capsule Take 1 capsule (240 mg total) by mouth daily. Pt takes once extra tablet daily as needed for AFIB 90 capsule  3  . diltiazem (CARDIZEM) 30 MG tablet Ttake 1 tablet every 4 hours AS NEEDED for rapid afib heart rate over 100 45 tablet 1  . DULoxetine (CYMBALTA) 30 MG capsule Take 30 mg by mouth as directed. TAKE 2 CAPSULES EVERY MORNING AND 1 CAPSULE AT NIGHT    . ELIQUIS 5 MG TABS tablet TAKE 1 TABLET BY MOUTH TWICE DAILY 180 tablet 1  . flecainide (TAMBOCOR) 100 MG tablet Take 1 tablet (100 mg total) by mouth 2 (two) times daily. 180 tablet 3  . furosemide (LASIX) 20 MG tablet Take 0.5 tablets (10 mg total) by mouth as needed for edema. 10 tablet 3  . levothyroxine (SYNTHROID, LEVOTHROID) 25 MCG tablet TAKE 1 TABLET BY MOUTH EVERY DAY 90 tablet 3  . Multiple Vitamin (MULTIVITAMIN) tablet Take 1 tablet by mouth daily.      . Oral Electrolytes (BUFFERED SALT PO) Take 1 capsule by mouth daily as needed (Electrolyte/Salt supplement).     . Timolol Maleate (TIMOPTIC OP) Place 1 drop into the left eye daily.     . vitamin B-12 (CYANOCOBALAMIN) 1000 MCG tablet Take 1,000 mcg by mouth daily.    . vitamin C (ASCORBIC ACID) 500 MG tablet Take 500 mg by mouth daily.    Marland Kitchen  vitamin E 400 UNIT capsule Take 400 Units by mouth daily.       No current facility-administered medications for this visit.     Allergies  Allergen Reactions  . Darvon Other (See Comments)    Severe panic attacks  . Propoxyphene Other (See Comments)    GI Upset Severe panic attacks GI Upset  . Propoxyphene N-Acetaminophen Other (See Comments)    Severe panic attacks  . Levofloxacin Anxiety and Other (See Comments)    Causes panic attacks.    Family History  Problem Relation Age of Onset  . Multiple sclerosis Father   . Breast cancer Neg Hx     Social History   Socioeconomic History  . Marital status: Married    Spouse name: Not on file  . Number of children: 0  . Years of education: Not on file  . Highest education level: Not on file  Social Needs  . Financial resource strain: Not on file  . Food insecurity - worry: Not  on file  . Food insecurity - inability: Not on file  . Transportation needs - medical: Not on file  . Transportation needs - non-medical: Not on file  Occupational History  . Occupation: Retired    Fish farm manager: RETIRED  Tobacco Use  . Smoking status: Never Smoker  . Smokeless tobacco: Never Used  Substance and Sexual Activity  . Alcohol use: Yes    Alcohol/week: 0.5 oz    Types: 1 Standard drinks or equivalent per week    Comment: regular  . Drug use: No  . Sexual activity: No  Other Topics Concern  . Not on file  Social History Narrative   Lives in [place] with her husband.  No children.  Former Psychologist, prison and probation services   Enjoys exercise- water classes, yoga Editor, commissioning.       Has a living will-    Would desire CPR   Would not want prolonged life support if futile.     Constitutional: Denies fever, malaise, fatigue, headache or abrupt weight changes.  Gastrointestinal: Denies abdominal pain, bloating, constipation, diarrhea or blood in the stool.  GU: Pt reports urinary frequency and dysuria. Denies urgency, burning sensation, blood in urine, odor or discharge.   No other specific complaints in a complete review of systems (except as listed in HPI above).  Objective:   Physical Exam   BP 110/76   Pulse 76   Temp 97.7 F (36.5 C) (Oral)   Wt 172 lb (78 kg)   SpO2 98%   BMI 24.68 kg/m  Wt Readings from Last 3 Encounters:  08/31/17 172 lb (78 kg)  08/20/17 170 lb 9.6 oz (77.4 kg)  08/20/17 170 lb 12 oz (77.5 kg)    General: Appears her stated age, well developed, well nourished in NAD. Abdomen: Soft and nontender. Normal bowel sounds. No distention or masses noted. No CVA tenderness noted. Pelvic: Normal female anatomy. Vaginal atrophy noted. No discharge or odor noted.  BMET    Component Value Date/Time   NA 139 08/20/2017 1619   NA 136 11/07/2014 1539   K 4.6 08/20/2017 1619   K 4.3 11/07/2014 1539   CL 99 08/20/2017 1619   CL 100 (L) 11/07/2014 1539    CO2 23 08/20/2017 1619   CO2 28 11/07/2014 1539   GLUCOSE 94 08/20/2017 1619   GLUCOSE 90 11/19/2016 0852   GLUCOSE 107 (H) 11/07/2014 1539   BUN 17 08/20/2017 1619   BUN 22 (H) 11/07/2014 1539  CREATININE 0.73 08/20/2017 1619   CREATININE 0.60 11/07/2014 1539   CALCIUM 8.9 08/20/2017 1619   CALCIUM 9.2 11/07/2014 1539   GFRNONAA 84 08/20/2017 1619   GFRNONAA >60 11/07/2014 1539   GFRAA 96 08/20/2017 1619   GFRAA >60 11/07/2014 1539    Lipid Panel     Component Value Date/Time   CHOL 223 (H) 05/07/2017 1619   TRIG 50 05/07/2017 1619   HDL 111 05/07/2017 1619   CHOLHDL 2.0 05/07/2017 1619   VLDL 12.4 05/06/2016 1412   LDLCALC 113 (H) 05/06/2016 1412    CBC    Component Value Date/Time   WBC 5.2 08/20/2017 1619   WBC 4.5 11/19/2016 0852   RBC 4.30 08/20/2017 1619   RBC 4.27 11/19/2016 0852   HGB 13.0 08/20/2017 1619   HCT 38.1 08/20/2017 1619   PLT 286 08/20/2017 1619   MCV 89 08/20/2017 1619   MCV 92 11/07/2014 1539   MCH 30.2 08/20/2017 1619   MCH 30.0 11/07/2014 1539   MCH 30.3 04/13/2011 0500   MCHC 34.1 08/20/2017 1619   MCHC 33.4 11/19/2016 0852   RDW 14.1 08/20/2017 1619   RDW 13.9 11/07/2014 1539   LYMPHSABS 1.7 11/19/2016 0852   MONOABS 0.6 11/19/2016 0852   EOSABS 0.2 11/19/2016 0852   BASOSABS 0.0 11/19/2016 0852    Hgb A1C No results found for: HGBA1C         Assessment & Plan:   Urinary Frequency and Dysuria,Candida Vaginitis, Atrophic Vaginitis:  Urinalysis: normal Will send urine culture Wet prep: + yeast eRx for Diflucan 150 PO x 1  If no improvement, consider Premarin cream for atrophic vaginitis  Return precautions discussed Webb Silversmith, NP

## 2017-08-31 NOTE — Patient Instructions (Signed)

## 2017-09-01 ENCOUNTER — Telehealth: Payer: Self-pay

## 2017-09-01 NOTE — Telephone Encounter (Signed)
That is fine with me.

## 2017-09-01 NOTE — Telephone Encounter (Signed)
Copied from [place] [phone]. Topic: Inquiry >> Sep 01, 2017  8:55 AM Cecelia Byars, NT wrote: Reason for CRM: Patient called and would like to change care to [facility], please advise

## 2017-09-01 NOTE — Telephone Encounter (Signed)
Fine with me

## 2017-09-03 ENCOUNTER — Other Ambulatory Visit: Payer: Self-pay | Admitting: Internal Medicine

## 2017-09-03 LAB — URINE CULTURE
MICRO NUMBER:: 90122943
SPECIMEN QUALITY: ADEQUATE

## 2017-09-03 MED ORDER — NITROFURANTOIN MONOHYD MACRO 100 MG PO CAPS: 100.0000 mg | ORAL_CAPSULE | Freq: Two times a day (BID) | ORAL | 0 refills | Status: DC

## 2017-09-06 ENCOUNTER — Encounter: Payer: Self-pay | Admitting: Neurology

## 2017-09-07 ENCOUNTER — Other Ambulatory Visit: Payer: Self-pay | Admitting: Neurology

## 2017-09-07 ENCOUNTER — Telehealth: Payer: Self-pay | Admitting: Neurology

## 2017-09-07 DIAGNOSIS — G4733 Obstructive sleep apnea (adult) (pediatric): Secondary | ICD-10-CM

## 2017-09-07 NOTE — Telephone Encounter (Signed)
Left message asking pt to call office. Pt needs transfer appointment with Rollene Fare

## 2017-09-07 NOTE — Telephone Encounter (Signed)
Called the pt in regards to her my chart message. I informed her that Dr Brett Fairy reviewed her sleep study in august and states that since she has gave it a good trial she feels that it would be safe discontinuing the CPAP. I will send a order to [facility] discontinuing the medication. Pt verbalized understanding.

## 2017-09-16 ENCOUNTER — Telehealth: Payer: Self-pay

## 2017-09-16 NOTE — Telephone Encounter (Signed)
Copied from [place] [phone]. Topic: General - Other >> Sep 16, 2017  3:17 PM Monica Whitaker wrote: Reason for CRM: Patient calling and would like to know when her next prolia injection is due. Please advise.

## 2017-09-17 NOTE — Telephone Encounter (Signed)
Spoke to pt and advised prolia injection due 4/25

## 2017-09-27 ENCOUNTER — Ambulatory Visit: Payer: Medicare Other | Admitting: Internal Medicine

## 2017-10-20 ENCOUNTER — Ambulatory Visit: Payer: Medicare Other | Admitting: Adult Health

## 2017-10-29 ENCOUNTER — Other Ambulatory Visit: Payer: Self-pay | Admitting: *Deleted

## 2017-10-29 MED ORDER — APIXABAN 5 MG PO TABS: 5.0000 mg | ORAL_TABLET | Freq: Two times a day (BID) | ORAL | 1 refills | Status: DC

## 2017-11-04 ENCOUNTER — Encounter: Payer: Self-pay | Admitting: Internal Medicine

## 2017-11-04 ENCOUNTER — Ambulatory Visit: Payer: Medicare Other | Admitting: Internal Medicine

## 2017-11-04 ENCOUNTER — Telehealth: Payer: Self-pay | Admitting: *Deleted

## 2017-11-04 VITALS — BP 102/68 | HR 68 | Temp 97.9°F | Wt 178.0 lb

## 2017-11-04 DIAGNOSIS — R350 Frequency of micturition: Secondary | ICD-10-CM

## 2017-11-04 DIAGNOSIS — E039 Hypothyroidism, unspecified: Secondary | ICD-10-CM | POA: Insufficient documentation

## 2017-11-04 DIAGNOSIS — I4819 Other persistent atrial fibrillation: Secondary | ICD-10-CM

## 2017-11-04 DIAGNOSIS — I951 Orthostatic hypotension: Secondary | ICD-10-CM

## 2017-11-04 DIAGNOSIS — N3000 Acute cystitis without hematuria: Secondary | ICD-10-CM | POA: Diagnosis not present

## 2017-11-04 DIAGNOSIS — F341 Dysthymic disorder: Secondary | ICD-10-CM | POA: Diagnosis not present

## 2017-11-04 DIAGNOSIS — I63412 Cerebral infarction due to embolism of left middle cerebral artery: Secondary | ICD-10-CM

## 2017-11-04 DIAGNOSIS — I481 Persistent atrial fibrillation: Secondary | ICD-10-CM | POA: Diagnosis not present

## 2017-11-04 DIAGNOSIS — R2689 Other abnormalities of gait and mobility: Secondary | ICD-10-CM

## 2017-11-04 DIAGNOSIS — R300 Dysuria: Secondary | ICD-10-CM

## 2017-11-04 DIAGNOSIS — M8000XP Age-related osteoporosis with current pathological fracture, unspecified site, subsequent encounter for fracture with malunion: Secondary | ICD-10-CM | POA: Diagnosis not present

## 2017-11-04 LAB — POC URINALSYSI DIPSTICK (AUTOMATED)
BILIRUBIN UA: NEGATIVE
Glucose, UA: NEGATIVE
Ketones, UA: NEGATIVE
NITRITE UA: NEGATIVE
PH UA: 6.5 (ref 5.0–8.0)
Spec Grav, UA: 1.02 (ref 1.010–1.025)
Urobilinogen, UA: 0.2 E.U./dL

## 2017-11-04 MED ORDER — SULFAMETHOXAZOLE-TRIMETHOPRIM 800-160 MG PO TABS: 1.0000 | ORAL_TABLET | Freq: Two times a day (BID) | ORAL | 0 refills | Status: DC

## 2017-11-04 NOTE — Addendum Note (Signed)
Addended by: Lurlean Nanny on: 11/04/2017 12:09 PM   Modules accepted: Orders

## 2017-11-04 NOTE — Telephone Encounter (Signed)
Information has been submitted to pts insurance for verification of benefits. Awaiting response for coverage  

## 2017-11-04 NOTE — Progress Notes (Signed)
HPI  Pt presents to the clinic today to establish care and for management of the conditions listed below. She is transferring care from Tor Netters, NP.  Anxiety and Depression: Triggered by general life stress. She reports she had a mental breakdown about 25 years ago. She is taking Dextrostat, Cymbalta and Abilify as prescribed. She follows with Dr. Charlott Holler.  Afib: s/p failed ablation. Controlled on Cardizem and Eliquis. ECG from 08/2017 reviewed. She follows with Dr. Rayann Heman.  CVA: This has residual effects on her balance and personality. She would like an order for PT at [facility] for balance issue. She denies recent falls. She is taking Eliquis as prescribed. She does not see neurology.  Orthostatic Hypotension: Controlled on Flecanide. She follows with Dr. Rayann Heman.  Hypothyroidism: She denies any issues on her current dose of Synthroid. She does not follow with endocrinology.  Osteoporosis: Bone density from 02/2016 reviewed. She is taking Prolia injections.  She also reports urgency, dysuria. She reports this started yesterday. She denies frequency, blood in her urine, vaginal discharge or bleeding, abdominal pain, back pain, fever, chills or nausea. She has increased her fluid intake and tried Cranberry tablets with minimal relief.  Flu: 05/2017 Tetanus: 10/2015 Pneumovax: 04/2015 Prevnar: 10/2013 Zosotvax: 04/2008 Shingrix: never Mammogram: 07/2017 Pap Smear: hysterectomy Colon Screening: 03/2013 Bone Density: 02/2016 Vision Screening: annually Dentist: quarterly  Past Medical History:  Diagnosis Date  . Anxiety    controlled with meds  . Atrial fibrillation ([name])    paroxysmal, failed medical therapy with flecainide,  NSVT with tikosyn  . Bruises easily   . CVA (cerebral vascular accident) ([place]) 12/2007  . Depression    medically controlled   . Low blood pressure    no falls, but if gets up to fast  . Stroke ([place]) 7 years ago   Lasting effects on balance, different  personality.  . Thyroid disease   . 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 acetaminophen (TYLENOL) 500 MG tablet Take 2 tablets by mouth every 6 hours as needed for pain    . apixaban (ELIQUIS) 5 MG TABS tablet Take 1 tablet (5 mg total) by mouth 2 (two) times daily. 180 tablet 1  . ARIPiprazole (ABILIFY) 10 MG tablet Take 10 mg by mouth daily. Take 1.5 tab    . calcium carbonate (OS-CAL) 600 MG TABS Take 600 mg by mouth daily with breakfast.     . cholecalciferol (VITAMIN D) 1000 UNITS tablet Take 2,000 Units by mouth daily.     Marland Kitchen denosumab (PROLIA) 60 MG/ML SOLN injection Inject into the skin.    Marland Kitchen dextroamphetamine (DEXTROSTAT) 5 MG tablet Take 5 mg by mouth daily.     Marland Kitchen diltiazem (CARDIZEM CD) 240 MG 24 hr capsule Take 1 capsule (240 mg total) by mouth daily. Pt takes once extra tablet daily as needed for AFIB 90 capsule 3  . diltiazem (CARDIZEM) 30 MG tablet Ttake 1 tablet every 4 hours AS NEEDED for rapid afib heart rate over 100 45 tablet 1  . DULoxetine (CYMBALTA) 30 MG capsule Take 30 mg by mouth as directed. TAKE 2 CAPSULES EVERY MORNING AND 1 CAPSULE AT NIGHT    . flecainide (TAMBOCOR) 100 MG tablet Take 1 tablet (100 mg total) by mouth 2 (two) times daily. 180 tablet 3  . furosemide (LASIX) 20 MG tablet Take 0.5 tablets (10 mg total) by mouth  as needed for edema. 10 tablet 3  . levothyroxine (SYNTHROID, LEVOTHROID) 25 MCG tablet TAKE 1 TABLET BY MOUTH EVERY DAY 90 tablet 3  . Multiple Vitamin (MULTIVITAMIN) tablet Take 1 tablet by mouth daily.      . nitrofurantoin, macrocrystal-monohydrate, (MACROBID) 100 MG capsule Take 1 capsule (100 mg total) by mouth 2 (two) times daily. 10 capsule 0  . Oral Electrolytes (BUFFERED SALT PO) Take 1 capsule by mouth daily as needed (Electrolyte/Salt supplement).     . Timolol Maleate (TIMOPTIC OP) Place 1 drop into the left eye daily.     .  vitamin B-12 (CYANOCOBALAMIN) 1000 MCG tablet Take 1,000 mcg by mouth daily.    . vitamin C (ASCORBIC ACID) 500 MG tablet Take 500 mg by mouth daily.    . vitamin E 400 UNIT capsule Take 400 Units by mouth daily.       No current facility-administered medications for this visit.     Allergies  Allergen Reactions  . Darvon Other (See Comments)    Severe panic attacks  . Propoxyphene Other (See Comments)    GI Upset Severe panic attacks GI Upset  . Propoxyphene N-Acetaminophen Other (See Comments)    Severe panic attacks  . Levofloxacin Anxiety and Other (See Comments)    Causes panic attacks.    Family History  Problem Relation Age of Onset  . Multiple sclerosis Father   . Breast cancer Neg Hx     Social History   Socioeconomic History  . Marital status: Married    Spouse name: Not on file  . Number of children: 0  . Years of education: Not on file  . Highest education level: Not on file  Occupational History  . Occupation: Retired    Fish farm manager: RETIRED  Social Needs  . Financial resource strain: Not on file  . Food insecurity:    Worry: Not on file    Inability: Not on file  . Transportation needs:    Medical: Not on file    Non-medical: Not on file  Tobacco Use  . Smoking status: Never Smoker  . Smokeless tobacco: Never Used  Substance and Sexual Activity  . Alcohol use: Yes    Alcohol/week: 0.5 oz    Types: 1 Standard drinks or equivalent per week    Comment: regular  . Drug use: No  . Sexual activity: Never  Lifestyle  . Physical activity:    Days per week: Not on file    Minutes per session: Not on file  . Stress: Not on file  Relationships  . Social connections:    Talks on phone: Not on file    Gets together: Not on file    Attends religious service: Not on file    Active member of club or organization: Not on file    Attends meetings of clubs or organizations: Not on file    Relationship status: Not on file  . Intimate partner violence:     Fear of current or ex partner: Not on file    Emotionally abused: Not on file    Physically abused: Not on file    Forced sexual activity: Not on file  Other Topics Concern  . Not on file  Social History Narrative   Lives in [place] with her husband.  No children.  Former Psychologist, prison and probation services   Enjoys exercise- water classes, yoga Editor, commissioning.       Has a living will-    Would desire CPR  Would not want prolonged life support if futile.    ROS:  Constitutional: Denies fever, malaise, fatigue, headache or abrupt weight changes.  HEENT: Denies eye pain, eye redness, ear pain, ringing in the ears, wax buildup, runny nose, nasal congestion, bloody nose, or sore throat. Respiratory: Denies difficulty breathing, shortness of breath, cough or sputum production.   Cardiovascular: Denies chest pain, chest tightness, palpitations or swelling in the hands or feet.  Gastrointestinal: Denies abdominal pain, bloating, constipation, diarrhea or blood in the stool.  GU: Pt reports urgency and dysuria. Denies frequency, blood in urine, odor or discharge. Musculoskeletal: Pt reports difficulty with gait. Denies decrease in range of motion, muscle pain or joint pain and swelling.  Skin: Denies redness, rashes, lesions or ulcercations.  Neurological: Pt reports difficulty with balance. Denies dizziness, difficulty with memory, difficulty with speech or problems with coordination.  Psych: Pt has a history of anxiety and depression. Denies SI/HI.  No other specific complaints in a complete review of systems (except as listed in HPI above).  PE:  BP 102/68   Pulse 68   Temp 97.9 F (36.6 C) (Oral)   Wt 178 lb (80.7 kg)   SpO2 98%   BMI 25.54 kg/m   Wt Readings from Last 3 Encounters:  08/31/17 172 lb (78 kg)  08/20/17 170 lb 9.6 oz (77.4 kg)  08/20/17 170 lb 12 oz (77.5 kg)    General: Appears her stated age, in NAD. Neck: Neck supple, trachea midline. No masses, lumps or thyromegaly  present.  Cardiovascular: Normal rate and rhythm. S1,S2 noted.   Pulmonary/Chest: Normal effort and positive vesicular breath sounds. No respiratory distress. No wheezes, rales or ronchi noted.  Abdomen: Soft and nontender. Normal bowel sounds. No CVA tenderness noted. Musculoskeletal: Gait slow, slightly unsteady. Neurological: Alert and oriented.  Psychiatric: Mood and affect mildly flat. Behavior is normal. Judgment and thought content normal.     BMET    Component Value Date/Time   NA 139 08/20/2017 1619   NA 136 11/07/2014 1539   K 4.6 08/20/2017 1619   K 4.3 11/07/2014 1539   CL 99 08/20/2017 1619   CL 100 (L) 11/07/2014 1539   CO2 23 08/20/2017 1619   CO2 28 11/07/2014 1539   GLUCOSE 94 08/20/2017 1619   GLUCOSE 90 11/19/2016 0852   GLUCOSE 107 (H) 11/07/2014 1539   BUN 17 08/20/2017 1619   BUN 22 (H) 11/07/2014 1539   CREATININE 0.73 08/20/2017 1619   CREATININE 0.60 11/07/2014 1539   CALCIUM 8.9 08/20/2017 1619   CALCIUM 9.2 11/07/2014 1539   GFRNONAA 84 08/20/2017 1619   GFRNONAA >60 11/07/2014 1539   GFRAA 96 08/20/2017 1619   GFRAA >60 11/07/2014 1539    Lipid Panel     Component Value Date/Time   CHOL 223 (H) 05/07/2017 1619   TRIG 50 05/07/2017 1619   HDL 111 05/07/2017 1619   CHOLHDL 2.0 05/07/2017 1619   VLDL 12.4 05/06/2016 1412   LDLCALC 99 05/07/2017 1619    CBC    Component Value Date/Time   WBC 5.2 08/20/2017 1619   WBC 4.5 11/19/2016 0852   RBC 4.30 08/20/2017 1619   RBC 4.27 11/19/2016 0852   HGB 13.0 08/20/2017 1619   HCT 38.1 08/20/2017 1619   PLT 286 08/20/2017 1619   MCV 89 08/20/2017 1619   MCV 92 11/07/2014 1539   MCH 30.2 08/20/2017 1619   MCH 30.0 11/07/2014 1539   MCH 30.3 04/13/2011 0500   MCHC 34.1  08/20/2017 1619   MCHC 33.4 11/19/2016 0852   RDW 14.1 08/20/2017 1619   RDW 13.9 11/07/2014 1539   LYMPHSABS 1.7 11/19/2016 0852   MONOABS 0.6 11/19/2016 0852   EOSABS 0.2 11/19/2016 0852   BASOSABS 0.0 11/19/2016 0852     Hgb A1C No results found for: HGBA1C   Assessment and Plan:  Urinary Frequency, Dysuria:  Urinalysis: 2+ leuks, 3+ blood Will send urine culture Push fluids Can take AZO OTC eRx for Septra BID x 5 days  Make an appt for your Medicare Wellness Exam Webb Silversmith, NP

## 2017-11-04 NOTE — Assessment & Plan Note (Signed)
Continue Prolia Will monitor Calcium and Vit D

## 2017-11-04 NOTE — Assessment & Plan Note (Signed)
Controlled on Cardizem and Eliquis She will continue to follow with cardiology

## 2017-11-04 NOTE — Assessment & Plan Note (Signed)
Chronic She is taking Dextrostat, Cymbalta and Abilify She will continue to follow with psychiatry

## 2017-11-04 NOTE — Patient Instructions (Signed)

## 2017-11-04 NOTE — Assessment & Plan Note (Signed)
Continue Synthroid Will check TSH and Free T4 annually

## 2017-11-04 NOTE — Assessment & Plan Note (Signed)
Continue Flecanide She will continue to follow with cardiology

## 2017-11-04 NOTE — Assessment & Plan Note (Signed)
Residual issues with balance Pt referral for [facility] placed Continue Eliquis-monitor for signs of bleeding

## 2017-11-05 ENCOUNTER — Ambulatory Visit: Payer: Self-pay | Admitting: *Deleted

## 2017-11-05 MED ORDER — CEPHALEXIN 250 MG PO CAPS: 250.0000 mg | ORAL_CAPSULE | Freq: Two times a day (BID) | ORAL | 0 refills | Status: DC

## 2017-11-05 NOTE — Addendum Note (Signed)
Addended by: Jearld Fenton on: 11/05/2017 02:19 PM   Modules accepted: Orders

## 2017-11-05 NOTE — Telephone Encounter (Signed)
Stop sulfa. Can take Benadryl 25 mg every 8 hours prn. eRx for Keflex sent to pharmacy

## 2017-11-05 NOTE — Telephone Encounter (Signed)
Spoke to pt

## 2017-11-05 NOTE — Telephone Encounter (Signed)
Pharmacy is [facility] on [address].  Reason for Disposition . Caller has URGENT medication question about med that PCP prescribed and triager unable to answer question  Answer Assessment - Initial Assessment Questions Patient was seen by Webb Silversmith, FNP yesterday for UTI. She was prescribed Sulfamethoxazole and has taken a 3 doses between last night and this morning.  She denies any other side effects or symptoms. She is complaining of feeling gittery (nervous) and feeling hyper.  Advised her to drink lots of water and to hold off medication until she hears back from a nurse, today.    1. SYMPTOMS: "Do you have any symptoms?"  feels gittery and hyper today after taking 3 doses of sulfamethoxazole that was prescribed for UTI by Webb Silversmith, FNP. Saw R. Baity yesterday.  2. SEVERITY: If symptoms are present, ask "Are they mild, moderate or severe?"    Mild.  Protocols used: MEDICATION QUESTION CALL-A-AH

## 2017-11-05 NOTE — Telephone Encounter (Signed)
I spoke with pt; pt thinks she is having a reaction to abx; pt feeling shaky; pt took sulfa at 2 pm, 9pm last night and took sulfa this AM at 7:30. Pt began to feel shaky this morning when she woke up. No difficulty breathing,rash,itching, swelling or N&V. Pt feels jittery inside and pt can see her hands trembling. Pt has not had upset that would make her feel jittery or shaky. Pt took Sulfa in 2012 and 2016 per [facility] CMA. I spoke with Dr Damita Dunnings Avie Echevaria NP in office today at [facility]) and he advised to hold abx until Avie Echevaria NP can review and to see if can get culture report today. Terri in lab said urine culture was sent late on 11/04/17 and cannot get results today; pt will wait for cb after R Baity NP reviews; pt has eaten and has been drinking plenty of fluids. If pt condition were to worsen or develop difficulty breathing,swelling, N&V, rash or itching pt will go to ED.Please advise.

## 2017-11-06 LAB — URINE CULTURE
MICRO NUMBER:: 90417874
SPECIMEN QUALITY: ADEQUATE

## 2017-11-25 ENCOUNTER — Other Ambulatory Visit (INDEPENDENT_AMBULATORY_CARE_PROVIDER_SITE_OTHER): Payer: Medicare Other

## 2017-11-25 ENCOUNTER — Other Ambulatory Visit: Payer: Self-pay | Admitting: Internal Medicine

## 2017-11-25 DIAGNOSIS — Z5181 Encounter for therapeutic drug level monitoring: Secondary | ICD-10-CM

## 2017-11-25 LAB — CALCIUM: CALCIUM: 9.1 mg/dL (ref 8.4–10.5)

## 2017-11-25 NOTE — Telephone Encounter (Signed)
Verification of benefits have been processed and an approval has been received for pts prolia injection. Pts estimated cost are appx $250. This is only an estimate and cannot be confirmed until benefits are paid. Please advise pt and schedule if needed. If scheduled, once the injection is received, pls contact me back with the date it was received so that I am able to update prolia folder. thanks  

## 2017-11-25 NOTE — Telephone Encounter (Signed)
Spoke to pt and advised; pt is agreeable and  Ca lab and Prolia injection scheduled.

## 2017-11-29 ENCOUNTER — Ambulatory Visit: Payer: Medicare Other | Admitting: Internal Medicine

## 2017-11-29 ENCOUNTER — Encounter: Payer: Self-pay | Admitting: Internal Medicine

## 2017-11-29 VITALS — BP 110/68 | HR 58 | Temp 97.8°F | Wt 170.0 lb

## 2017-11-29 DIAGNOSIS — R350 Frequency of micturition: Secondary | ICD-10-CM | POA: Diagnosis not present

## 2017-11-29 DIAGNOSIS — R300 Dysuria: Secondary | ICD-10-CM | POA: Diagnosis not present

## 2017-11-29 LAB — POC URINALSYSI DIPSTICK (AUTOMATED)
Bilirubin, UA: NEGATIVE
GLUCOSE UA: NEGATIVE
Ketones, UA: NEGATIVE
Leukocytes, UA: NEGATIVE
NITRITE UA: NEGATIVE
Protein, UA: NEGATIVE
RBC UA: NEGATIVE
Spec Grav, UA: 1.015 (ref 1.010–1.025)
UROBILINOGEN UA: 0.2 U/dL
pH, UA: 6.5 (ref 5.0–8.0)

## 2017-11-29 NOTE — Addendum Note (Signed)
Addended by: Lurlean Nanny on: 11/29/2017 04:22 PM   Modules accepted: Orders

## 2017-11-29 NOTE — Progress Notes (Signed)
HPI  Pt presents to the clinic today with c/o urinary frequency and dysuria. She reports this started 2 days ago. She denies urgency, blood in her urine or vaginal complaints. She has been having some issues with incontinence, and has been wearing a pad daily. She denies fever, chills, nausea or low back pain. She has tried increasing her fluid intake and cranberry tabs with minimal relief. Her last UTI was 3 weeks ago. Culture grew E Coli, treated with Septra.   Review of Systems  Past Medical History:  Diagnosis Date  . Anxiety    controlled with meds  . Atrial fibrillation ([name])    paroxysmal, failed medical therapy with flecainide,  NSVT with tikosyn  . Bruises easily   . CVA (cerebral vascular accident) ([place]) 12/2007  . Depression    medically controlled   . Low blood pressure    no falls, but if gets up to fast  . Stroke ([place]) 7 years ago   Lasting effects on balance, different personality.  . Thyroid disease   . 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 Multiple sclerosis Father   . Breast cancer Neg Hx     Social History   Socioeconomic History  . Marital status: Married    Spouse name: Not on file  . Number of children: 0  . Years of education: Not on file  . Highest education level: Not on file  Occupational History  . Occupation: Retired    Fish farm manager: RETIRED  Social Needs  . Financial resource strain: Not on file  . Food insecurity:    Worry: Not on file    Inability: Not on file  . Transportation needs:    Medical: Not on file    Non-medical: Not on file  Tobacco Use  . Smoking status: Never Smoker  . Smokeless tobacco: Never Used  Substance and Sexual Activity  . Alcohol use: Yes    Alcohol/week: 0.5 oz    Types: 1 Standard drinks or equivalent per week    Comment: regular  . Drug use: No  . Sexual activity: Never  Lifestyle  . Physical activity:    Days per  week: Not on file    Minutes per session: Not on file  . Stress: Not on file  Relationships  . Social connections:    Talks on phone: Not on file    Gets together: Not on file    Attends religious service: Not on file    Active member of club or organization: Not on file    Attends meetings of clubs or organizations: Not on file    Relationship status: Not on file  . Intimate partner violence:    Fear of current or ex partner: Not on file    Emotionally abused: Not on file    Physically abused: Not on file    Forced sexual activity: Not on file  Other Topics Concern  . Not on file  Social History Narrative   Lives in [place] with her husband.  No children.  Former Psychologist, prison and probation services   Enjoys exercise- water classes, yoga Editor, commissioning.       Has a living will-    Would desire CPR   Would not want prolonged life support if futile.    Allergies  Allergen Reactions  . Darvon Other (See Comments)    Severe panic attacks  .  Propoxyphene Other (See Comments)    GI Upset Severe panic attacks GI Upset  . Propoxyphene N-Acetaminophen Other (See Comments)    Severe panic attacks  . Levofloxacin Anxiety and Other (See Comments)    Causes panic attacks.     Constitutional: Denies fever, malaise, fatigue, headache or abrupt weight changes.   GU: Pt reports  frequency and pain with urination. Denies urgency, burning sensation, blood in urine, odor or discharge.   No other specific complaints in a complete review of systems (except as listed in HPI above).    Objective:   Physical Exam  BP 110/68   Pulse (!) 58   Temp 97.8 F (36.6 C) (Oral)   Wt 170 lb (77.1 kg)   SpO2 97%   BMI 24.39 kg/m  Wt Readings from Last 3 Encounters:  11/29/17 170 lb (77.1 kg)  11/04/17 178 lb (80.7 kg)  08/31/17 172 lb (78 kg)    General: Appears her stated age, well developed, well nourished in NAD. Abdomen: Soft. Normal bowel sounds. No distention or masses noted.  Tender to  palpation over the bladder area. No CVA tenderness.        Assessment & Plan:  Frequency, Dysuria, Possible Cystitis:  Urinalysis: normal Will send urine culture OK to take AZO OTC Drink plenty of fluids  RTC as needed or if symptoms persist. Webb Silversmith, NP

## 2017-11-29 NOTE — Patient Instructions (Signed)

## 2017-11-30 ENCOUNTER — Ambulatory Visit (INDEPENDENT_AMBULATORY_CARE_PROVIDER_SITE_OTHER): Payer: Medicare Other | Admitting: Emergency Medicine

## 2017-11-30 DIAGNOSIS — M810 Age-related osteoporosis without current pathological fracture: Secondary | ICD-10-CM

## 2017-11-30 LAB — URINE CULTURE
MICRO NUMBER:: 90519146
SPECIMEN QUALITY: ADEQUATE

## 2017-11-30 MED ORDER — DENOSUMAB 60 MG/ML ~~LOC~~ SOSY
60.0000 mg | PREFILLED_SYRINGE | Freq: Once | SUBCUTANEOUS | Status: AC
  Administered 2017-11-30: 60 mg via SUBCUTANEOUS

## 2017-12-09 ENCOUNTER — Ambulatory Visit: Payer: Medicare Other | Attending: Internal Medicine

## 2017-12-09 DIAGNOSIS — R2681 Unsteadiness on feet: Secondary | ICD-10-CM | POA: Diagnosis not present

## 2017-12-09 DIAGNOSIS — M6281 Muscle weakness (generalized): Secondary | ICD-10-CM | POA: Insufficient documentation

## 2017-12-09 NOTE — Therapy (Signed)
[place] MAIN [facility] SERVICES [address] [place], [place], [address] Phone: [phone]   Fax:  [phone]  Physical Therapy Evaluation  Patient Details  Name: Monica Whitaker MRN: [id number] Date of Birth: 29-Jun-1946 Referring Provider: Linzie Collin, NP    Encounter Date: 12/09/2017  PT End of Session - 12/09/17 0958    Visit Number  1    Number of Visits  12    Date for PT Re-Evaluation  01/20/18    Authorization Time Period  Eval on 12/09/17, reEvaluation on 01/20/18; (reassessment needed on 12/30/17.     PT Start Time  0900    PT Stop Time  0957    PT Time Calculation (min)  57 min    Activity Tolerance  Patient tolerated treatment well;Patient limited by fatigue    Behavior During Therapy  [facility] for tasks assessed/performed       Past Medical History:  Diagnosis Date  . Anxiety    controlled with meds  . Atrial fibrillation ([name])    paroxysmal, failed medical therapy with flecainide,  NSVT with tikosyn  . Bruises easily   . CVA (cerebral vascular accident) ([place]) 12/2007  . Depression    medically controlled   . Low blood pressure    no falls, but if gets up to fast  . Stroke ([place]) 7 years ago   Lasting effects on balance, different personality.  . Thyroid disease   . 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 ATRIAL FIBRILLATION ABLATION  10/18/12   PVI by Dr Rayann Heman  . ATRIAL FIBRILLATION ABLATION N/A 10/18/2012   Procedure: ATRIAL FIBRILLATION ABLATION;  Surgeon: Thompson Grayer, MD;  Location: [facility] CATH LAB;  Service: Cardiovascular;  Laterality: N/A;  . EYE SURGERY     on L/R eye, macular hole   . OPEN REDUCTION INTERNAL FIXATION (ORIF) DISTAL RADIAL FRACTURE Right 11/15/2014   Procedure: OPEN REDUCTION INTERNAL FIXATION (ORIF) RIGHT DISTAL RADIAL FRACTURE WITH ALLOGRAFT BONE GRAFT;  Surgeon: Roseanne Kaufman, MD;  Location: [place];  Service: Orthopedics;  Laterality: Right;  . TEE WITHOUT CARDIOVERSION N/A 10/18/2012   Procedure: TRANSESOPHAGEAL ECHOCARDIOGRAM (TEE);  Surgeon: Lelon Perla, MD;  Location: [facility] ENDOSCOPY;  Service: Cardiovascular;  Laterality: N/A;  Pre-Ablation at 12pm  . TONSILLECTOMY    . VAGINAL HYSTERECTOMY      There were no vitals filed for this visit.   Subjective Assessment - 12/09/17 0906    Subjective  Pt reports sustaining a CVA in 2009 with involvement of RUE/RLE. She took PT most recently in 2017 to address some recurrent dysfunction in the RLE, difficulty walking, balance impairment. Pt reports she has noticed a decline in RLE about 6MA, started to do more walking and exercise classes at [facility], but function did not notably improve. She dscribes her RLE dysfunction as "clumsy and weak." Pt reports surface type does not make a large difference but uneven surfaces do make balance difficult. Pt reports 1 fall in the past 6 months, d/t memory deficits unable to give detail but she suspects some involvement of presyncope, never formally assessed d/t lack of injury. She reports baseline orthostatic BP problems, will take salt tablets PRN or wait and hydrate. Pt is seeking PT services to address these issues as tey have been quite helpful in the past.     Pertinent History  Pt  reports sustaining a CVA in 2009 with involvement of RUE/RLE. She took PT most recently in 2017 to address some recurrent dysfunction in the RLE, difficulty walking, balance impairment. Pt reports she has noticed a decline in RLE about 6MA, started to do more walking and exercise classes at [facility], but function did not notably improve. She dscribes her RLE dysfunction as "clumsy and weak." Pt reports surface type does not make a large difference but uneven surfaces do make balance difficult. Pt reports 1 fall in the past 6 months.     How long can you sit comfortably?  Unlimited     How long can you stand  comfortably?  not limited in ADL/IADL, but feels on edge, needing to focus on restraining multiplanar movements     How long can you walk comfortably?  Previously AMB 1 mile loop at [facility] with 2 trekking poles, but has not in several months; community distances not limited, but a cart improves confidence     Diagnostic tests  none recently     Patient Stated Goals  reduce falls, improve confidence, improve falls recovery     Currently in Pain?  No/denies         [facility] PT Assessment - 12/09/17 0001      Assessment   Medical Diagnosis  Difficulty walking; balance diffculty     Referring Provider  Linzie Collin, NP     Onset Date/Surgical Date  -- 2009; decline recently over the last 6 months     Hand Dominance  Right    Next MD Visit  -- None pertaining to this episode    Prior Therapy  OPPT at [facility] main in 2017       Precautions   Precautions  None      Balance Screen   Has the patient fallen in the past 6 months  Yes    How many times?  1    Has the patient had a decrease in activity level because of a fear of falling?   Yes    Is the patient reluctant to leave their home because of a fear of falling?   No      Home Environment   Living Environment  Private residence    Living Arrangements  Spouse/significant other Pt assists with meals, dressing.     Type of Home  Apartment fully accessible apartment at [place];Other (comment) intermitten tuse of trekking poles with AMB for exercises.     Vocation  Retired    Leisure  Exercise classes at [employer] 1-2 hours daily M-F. (has helped with back pain issues)       Cognition   Overall Cognitive Status  Within Functional Limits for tasks assessed    Memory  Impaired    Memory Impairment  Decreased recall of new information d/t CVA 10YA, anomia (meds, people, Drs), trouble c timeline      Observation/Other Assessments   Other Surveys   Other Surveys    Activities  of Balance Confidence Scale (ABC Scale)   41.25% self confidence       Sensation   Light Touch  Not tested chronic Rt foot numbness since CVA; plantar and lateral foot      Functional Tests   Functional tests  Single leg stance;Sit to Stand      Single Leg Stance   Comments  Bila t<1 sec; will  not attempt s MinGuard Assist       Sit to Stand   Comments  4xSTS hands free: 19.17sec unable to rise 5th time; significant dynamic valgus at knees      ROM / Strength   AROM / PROM / Strength  Strength      Strength   Strength Assessment Site  Hip;Knee;Ankle    Right/Left Hip  Right;Left    Right Hip Flexion  4+/5    Right Hip External Rotation   4/5    Right Hip Internal Rotation  4-/5    Right Hip ABduction  3+/5 horizontal ABD: 5/5    Right Hip ADduction  5/5    Left Hip Flexion  5/5    Left Hip External Rotation  5/5    Left Hip Internal Rotation  5/5    Left Hip ABduction  4-/5 horizontal ABD: 5/5     Left Hip ADduction  5/5    Right/Left Knee  Right;Left    Right Knee Flexion  5/5    Right Knee Extension  4/5    Left Knee Flexion  5/5    Left Knee Extension  5/5    Right/Left Ankle  Right;Left    Right Ankle Dorsiflexion  4+/5    Right Ankle Plantar Flexion  5/5 23x, poor eccentric control    Left Ankle Dorsiflexion  5/5    Left Ankle Plantar Flexion  5/5 stopped patient at 30x, height is limited to ~50% normal ROM      Ambulation/Gait   Ambulation/Gait  Yes    Ambulation Distance (Feet)  200 Feet    Assistive device  None    Gait Pattern  Decreased arm swing - right;Decreased arm swing - left;Step-through pattern;Lateral trunk lean to left;Right flexed knee in stance;Left flexed knee in stance Right hip trendelenburg after 110ft    Ambulation Surface  Level;Indoor    Gait velocity  0.56m/s (self selected) 1.38m/s maximal gait speed (10MWT)    Gait Comments  Right leg fatigues quickly       Balance   Balance Assessed  Yes      Standardized Balance Assessment    Standardized Balance Assessment  Timed Up and Go Test      Timed Up and Go Test   Normal TUG (seconds)  11.3 turning is biggest limitation                Objective measurements completed on examination: See above findings.              PT Education - 12/09/17 0957    Education provided  Yes    Education Details  Explained that current deficits are related to both CVA and non-CVA related factors.     Person(s) Educated  Patient    Methods  Explanation    Comprehension  Verbalized understanding;Need further instruction       PT Short Term Goals - 12/09/17 1013      PT SHORT TERM GOAL #1   Title  After 3 weeks patient will demonstrate ability to perform 5xSTS or 4xSTS hands free in less tan 18 seconds.     Status  New      PT SHORT TERM GOAL #2   Title  After 3 weeks patient will demonstrate improved 10MWT in <8.0sec.     Status  New        PT Long Term Goals - 12/09/17 1014      PT LONG TERM GOAL #1  Title  After 6 weeks patient will demonstrate 5xSTS hands free in <17 seconds.       PT LONG TERM GOAL #2   Title  After 6 weeks patient will improve MMT by 1/2 grade in Right knee extension, ankle dorsiflexion, and hip abduction.     Status  New      PT LONG TERM GOAL #3   Title  After 6 weeks patient will demonstrate improved TUG <10sec to demnstrate decreased falls risk.     Status  New      PT LONG TERM GOAL #4   Title  After 6 weeks pt will demonstrate 6MWT with average gait speed >1.72m.s to improve ability to access th ecommunity ad lib.     Baseline  self selected gait speed is 0.90-0.66m/s over 240ft.     Status  New             Plan - 12/09/17 1002    Clinical Impression Statement  Elorah Dewing is a 73yo white female who comes to PT for help with worsening RLE function, decreaesd confidence in balance, and concerns over falling more. Examination revealing of multiple deficits contributory empirically to falls risk, SLS<1sec bilat with  limited self recovery. inability to perform 5xSTS hands free, isolated strength deficits in the Right hip/knee extensors, Right hip abductors, and right dorsiflexors, narrow differentiial betwen self selected gait speed and maximal gait speed. TUG does not indicated high falls risk, but qualitatively deonstrates low confidence and addition work required to manage turning in stance. Self select AMB speed is also not indicative of falls risk, but appropriate for that of a limited communiy AMB. Pt reports baseline orthostasis issues, not a factor in toda's visist, but it sounds as though that is being well managed. Pt also has a history of CVA related memory and personality changes from 10YA, hence it should be further investigated how cognitive impairment is contributing to LOB. Strength deficts are clearly llimititng ability to recover LOB with insufficicent force generation needed to quickly change body position in space. Pt will benefit from skille dPT interventions to addresss impairment detailed in this note in order to improve safety and confidence in day to day activity, and to reduce risk of falls.     History and Personal Factors relevant to plan of care:  CVA related memory difficulty (mostly anomia and long-term timeline perception); cares for her husband; has done well with PT in years past; Exercises 4-6 hours weekly.     Clinical Presentation  Unstable    Clinical Presentation due to:  Objective Tests and measures.     Clinical Decision Making  High    Rehab Potential  Good    PT Frequency  2x / week    PT Duration  6 weeks    PT Treatment/Interventions  ADLs/Self Care Home Management;Electrical Stimulation;Gait training;Moist Heat;Stair training;Functional mobility training;Therapeutic activities;Therapeutic exercise;Balance training;Neuromuscular re-education;Patient/family education;Cognitive remediation;Passive range of motion    PT Next Visit Plan  Review goals, establish HEP with handouts;  general hip strengthening and basic balance training.     PT Home Exercise Plan  none established this visit, difficulty with printer     Consulted and Agree with Plan of Care  Patient       Patient will benefit from skilled therapeutic intervention in order to improve the following deficits and impairments:  Abnormal gait, Difficulty walking, Cardiopulmonary status limiting activity, Dizziness, Decreased endurance, Decreased activity tolerance, Decreased knowledge of precautions, Decreased balance, Decreased cognition, Decreased  mobility, Decreased strength, Postural dysfunction  Visit Diagnosis: Unsteadiness on feet - Plan: PT plan of care cert/re-cert  Muscle weakness (generalized) - Plan: PT plan of care cert/re-cert     Problem List Patient Active Problem List   Diagnosis Date Noted  . Orthostatic hypotension 11/04/2017  . Hypothyroidism 11/04/2017  . OSA on CPAP 06/21/2017  . Persistent atrial fibrillation ([place]) 06/21/2017  . Cerebrovascular accident (CVA) due to embolism of left middle cerebral artery ([place]) 03/04/2017  . Osteoporosis 12/26/2015  . ANXIETY DEPRESSION 04/10/2010   10:25 AM, 12/09/17 Etta Grandchild, PT, DPT Physical Therapist - [place] [facility] 12/09/2017, 10:25 AM  [place] MAIN [facility] SERVICES [address] [place], [place], [address] Phone: [phone]   Fax:  [phone]  Name: Monica Whitaker MRN: [id number] Date of Birth: [birth year]

## 2017-12-13 ENCOUNTER — Ambulatory Visit: Payer: Medicare Other

## 2017-12-13 DIAGNOSIS — R2681 Unsteadiness on feet: Secondary | ICD-10-CM | POA: Diagnosis not present

## 2017-12-13 DIAGNOSIS — M6281 Muscle weakness (generalized): Secondary | ICD-10-CM

## 2017-12-13 NOTE — Therapy (Signed)
[place] MAIN [facility] SERVICES [address] [place], [place], [address] Phone: [phone]   Fax:  [phone]  Physical Therapy Treatment  Patient Details  Name: Monica Whitaker MRN: [id number] Date of Birth: [birth year] Referring Provider: Linzie Collin, NP    Encounter Date: 12/13/2017  PT End of Session - 12/13/17 1519    Visit Number  2    Number of Visits  12    Date for PT Re-Evaluation  01/20/18    Authorization Type  2/10    Authorization Time Period  Eval on 12/09/17, reEvaluation on 01/20/18; (reassessment needed on 12/30/17.     PT Start Time  1515    PT Stop Time  1600    PT Time Calculation (min)  45 min    Activity Tolerance  Patient tolerated treatment well;Patient limited by fatigue    Behavior During Therapy  [facility] for tasks assessed/performed       Past Medical History:  Diagnosis Date  . Anxiety    controlled with meds  . Atrial fibrillation ([name])    paroxysmal, failed medical therapy with flecainide,  NSVT with tikosyn  . Bruises easily   . CVA (cerebral vascular accident) ([place]) 12/2007  . Depression    medically controlled   . Low blood pressure    no falls, but if gets up to fast  . Stroke ([place]) 7 years ago   Lasting effects on balance, different personality.  . Thyroid disease   . Ventricular tachycardia ([place])    pt told at [facility] that she had RVOT VT    Past Surgical History:  Procedure Laterality Date  . ATRIAL FIBRILLATION ABLATION  10/18/12   PVI by Dr Rayann Heman  . ATRIAL FIBRILLATION ABLATION N/A 10/18/2012   Procedure: ATRIAL FIBRILLATION ABLATION;  Surgeon: Thompson Grayer, MD;  Location: [facility] CATH LAB;  Service: Cardiovascular;  Laterality: N/A;  . EYE SURGERY     on L/R eye, macular hole   . OPEN REDUCTION INTERNAL FIXATION (ORIF) DISTAL RADIAL FRACTURE Right 11/15/2014   Procedure: OPEN REDUCTION INTERNAL FIXATION (ORIF) RIGHT DISTAL RADIAL FRACTURE WITH ALLOGRAFT BONE GRAFT;  Surgeon: Roseanne Kaufman, MD;   Location: [place];  Service: Orthopedics;  Laterality: Right;  . TEE WITHOUT CARDIOVERSION N/A 10/18/2012   Procedure: TRANSESOPHAGEAL ECHOCARDIOGRAM (TEE);  Surgeon: Lelon Perla, MD;  Location: [facility] ENDOSCOPY;  Service: Cardiovascular;  Laterality: N/A;  Pre-Ablation at 12pm  . TONSILLECTOMY    . VAGINAL HYSTERECTOMY      There were no vitals filed for this visit.  Subjective Assessment - 12/13/17 1518    Subjective  Patient reports doing her yoga this morning with her morning yoga class. Daughter in law is coming to visit today.     Pertinent History  Pt reports sustaining a CVA in 2009 with involvement of RUE/RLE. She took PT most recently in 2017 to address some recurrent dysfunction in the RLE, difficulty walking, balance impairment. Pt reports she has noticed a decline in RLE about 6MA, started to do more walking and exercise classes at [facility], but function did not notably improve. She dscribes her RLE dysfunction as "clumsy and weak." Pt reports surface type does not make a large difference but uneven surfaces do make balance difficult. Pt reports 1 fall in the past 6 months.     How long can you sit comfortably?  Unlimited     How long can you stand comfortably?  not limited in ADL/IADL, but feels  on edge, needing to focus on restraining multiplanar movements     How long can you walk comfortably?  Previously AMB 1 mile loop at [facility] with 2 trekking poles, but has not in several months; community distances not limited, but a cart improves confidence     Diagnostic tests  none recently     Patient Stated Goals  reduce falls, improve confidence, improve falls recovery     Currently in Pain?  No/denies       NuStep Lvl 4 3 minutes  Airex pad 2 minutes horizontal head turns no LOB, RLE fatigue  Standing hip marches 20x ; occasional UE support, requires cueing for decreased speed, narrow BOS  Ambulating in// bars with plank between feet to improve BOS.     Access Code: [email or web address]  URL: [email or web address]  Date: 12/13/2017  Prepared by: Janna Arch   Exercises  Standing March with Counter Support - 20 reps - 1 sets - 2 hold - 1x daily - 7x weekly  Standing Hip Extension with Counter Support - 10 reps - 2 hold - 1x daily - 7x weekly  Standing Hip Abduction with Counter Support - 10 reps - 1 sets - 2 hold - 1x daily - 7x weekly  Standing Hip Flexion with Counter Support - 10 reps - 1 sets - 2 hold - 1x daily - 7x weekly  Forward Step Up with Counter Support - 10 reps - 1 sets - 1x daily - 7x weekly   Stair negotiation: up and down 5 steps: BUE support, cues for wider BOS due to catching of RLE when narrow BOS.    Airex pad: balance beam side step 4x length of // bars                       PT Education - 12/13/17 1519    Education provided  Yes    Education Details  HEP, exercise technique, balance    Person(s) Educated  Patient    Methods  Explanation;Demonstration;Verbal cues    Comprehension  Verbalized understanding;Returned demonstration       PT Short Term Goals - 12/09/17 1013      PT SHORT TERM GOAL #1   Title  After 3 weeks patient will demonstrate ability to perform 5xSTS or 4xSTS hands free in less tan 18 seconds.     Status  New      PT SHORT TERM GOAL #2   Title  After 3 weeks patient will demonstrate improved 10MWT in <8.0sec.     Status  New        PT Long Term Goals - 12/09/17 1014      PT LONG TERM GOAL #1   Title  After 6 weeks patient will demonstrate 5xSTS hands free in <17 seconds.       PT LONG TERM GOAL #2   Title  After 6 weeks patient will improve MMT by 1/2 grade in Right knee extension, ankle dorsiflexion, and hip abduction.     Status  New      PT LONG TERM GOAL #3   Title  After 6 weeks patient will demonstrate improved TUG <10sec to demnstrate decreased falls risk.     Status  New      PT LONG TERM GOAL #4   Title  After 6 weeks pt will demonstrate 6MWT  with average gait speed >1.38m.s to improve ability to access th ecommunity ad lib.     Baseline  self  selected gait speed is 0.90-0.51m/s over 27ft.     Status  New            Plan - 12/13/17 1601    Clinical Impression Statement  Patient challenged with stair negotiation, unable to perform without UE support at this time due to fear of LOB with occasional catch of RLE due to reduced base of support width. Excessive weakness of RLE noted with decreased weight shift onto RLE when ambulating with consequential decreased BOS. Patient will continue to benefit from skilled physical therapy to improve balance,strength, reduce fall risk and return to PLOF.     Rehab Potential  Good    PT Frequency  2x / week    PT Duration  6 weeks    PT Treatment/Interventions  ADLs/Self Care Home Management;Electrical Stimulation;Gait training;Moist Heat;Stair training;Functional mobility training;Therapeutic activities;Therapeutic exercise;Balance training;Neuromuscular re-education;Patient/family education;Cognitive remediation;Passive range of motion    PT Next Visit Plan  Review goals, establish HEP with handouts; general hip strengthening and basic balance training.     PT Home Exercise Plan  none established this visit, difficulty with printer     Consulted and Agree with Plan of Care  Patient       Patient will benefit from skilled therapeutic intervention in order to improve the following deficits and impairments:  Abnormal gait, Difficulty walking, Cardiopulmonary status limiting activity, Dizziness, Decreased endurance, Decreased activity tolerance, Decreased knowledge of precautions, Decreased balance, Decreased cognition, Decreased mobility, Decreased strength, Postural dysfunction  Visit Diagnosis: Unsteadiness on feet  Muscle weakness (generalized)     Problem List Patient Active Problem List   Diagnosis Date Noted  . Orthostatic hypotension 11/04/2017  . Hypothyroidism 11/04/2017  .  OSA on CPAP 06/21/2017  . Persistent atrial fibrillation ([place]) 06/21/2017  . Cerebrovascular accident (CVA) due to embolism of left middle cerebral artery ([place]) 03/04/2017  . Osteoporosis 12/26/2015  . ANXIETY DEPRESSION 04/10/2010   Janna Arch, PT, DPT   12/13/2017, 4:03 PM  [place] MAIN [facility] SERVICES [address] [place], [place], [address] Phone: [phone]   Fax:  [phone]  Name: Monica Whitaker MRN: [id number] Date of Birth: Apr 13, 1946

## 2017-12-15 ENCOUNTER — Ambulatory Visit: Payer: Medicare Other

## 2017-12-15 DIAGNOSIS — R2681 Unsteadiness on feet: Secondary | ICD-10-CM | POA: Diagnosis not present

## 2017-12-15 DIAGNOSIS — M6281 Muscle weakness (generalized): Secondary | ICD-10-CM

## 2017-12-15 NOTE — Therapy (Signed)
[place] MAIN [facility] SERVICES [address] [place], [place], [address] Phone: [phone]   Fax:  [phone]  Physical Therapy Treatment  Patient Details  Name: Monica Whitaker MRN: [id number] Date of Birth: 1945/11/11 Referring Provider: Linzie Collin, NP    Encounter Date: 12/15/2017  PT End of Session - 12/15/17 1537    Visit Number  3    Number of Visits  12    Date for PT Re-Evaluation  01/20/18    Authorization Type  3/10    Authorization Time Period  Eval on 12/09/17, reEvaluation on 01/20/18; (reassessment needed on 12/30/17.     PT Start Time  1511    PT Stop Time  1557    PT Time Calculation (min)  46 min    Activity Tolerance  Patient tolerated treatment well;Patient limited by fatigue    Behavior During Therapy  [facility] for tasks assessed/performed       Past Medical History:  Diagnosis Date  . Anxiety    controlled with meds  . Atrial fibrillation ([name])    paroxysmal, failed medical therapy with flecainide,  NSVT with tikosyn  . Bruises easily   . CVA (cerebral vascular accident) ([place]) 12/2007  . Depression    medically controlled   . Low blood pressure    no falls, but if gets up to fast  . Stroke ([place]) 7 years ago   Lasting effects on balance, different personality.  . Thyroid disease   . 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 ATRIAL FIBRILLATION ABLATION  10/18/12   PVI by Dr Rayann Heman  . ATRIAL FIBRILLATION ABLATION N/A 10/18/2012   Procedure: ATRIAL FIBRILLATION ABLATION;  Surgeon: Thompson Grayer, MD;  Location: [facility] CATH LAB;  Service: Cardiovascular;  Laterality: N/A;  . EYE SURGERY     on L/R eye, macular hole   . OPEN REDUCTION INTERNAL FIXATION (ORIF) DISTAL RADIAL FRACTURE Right 11/15/2014   Procedure: OPEN REDUCTION INTERNAL FIXATION (ORIF) RIGHT DISTAL RADIAL FRACTURE WITH ALLOGRAFT BONE GRAFT;  Surgeon: Roseanne Kaufman, MD;   Location: [place];  Service: Orthopedics;  Laterality: Right;  . TEE WITHOUT CARDIOVERSION N/A 10/18/2012   Procedure: TRANSESOPHAGEAL ECHOCARDIOGRAM (TEE);  Surgeon: Lelon Perla, MD;  Location: [facility] ENDOSCOPY;  Service: Cardiovascular;  Laterality: N/A;  Pre-Ablation at 12pm  . TONSILLECTOMY    . VAGINAL HYSTERECTOMY      There were no vitals filed for this visit.  Subjective Assessment - 12/15/17 1536    Subjective  Patient's daughter in law coming to visit soon. Going to a party tonight. Had lunch outside this morning. Has been watching her ambulation to increase BOS    Pertinent History  Pt reports sustaining a CVA in 2009 with involvement of RUE/RLE. She took PT most recently in 2017 to address some recurrent dysfunction in the RLE, difficulty walking, balance impairment. Pt reports she has noticed a decline in RLE about 6MA, started to do more walking and exercise classes at [facility], but function did not notably improve. She dscribes her RLE dysfunction as "clumsy and weak." Pt reports surface type does not make a large difference but uneven surfaces do make balance difficult. Pt reports 1 fall in the past 6 months.     How long can you sit comfortably?  Unlimited     How long can you stand comfortably?  not  limited in ADL/IADL, but feels on edge, needing to focus on restraining multiplanar movements     How long can you walk comfortably?  Previously AMB 1 mile loop at [facility] with 2 trekking poles, but has not in several months; community distances not limited, but a cart improves confidence     Diagnostic tests  none recently     Patient Stated Goals  reduce falls, improve confidence, improve falls recovery     Currently in Pain?  No/denies       Single limb Leg press #30  2x12  Hamstring curl machine single leg eccentric 2x10 RLE only #1  Hamstring curl machine BLE 2x10 #3  Quad set machine single leg eccentric double leg concentric 1x10; stopped due to  bruising RLE only #1  Quad set machine: BLE 2x10 #2  Bosu ball forward lunge 10x BUE support ; BLE  Bosu ball side lunge 10x BUE support, BLE   Seated prostretch 2x30 seconds BLE   Pt. response to medical necessity:  Patient will continue to benefit from skilled physical therapy to improve balance, strength, and reduce fall risk for return to PLOF.                        PT Education - 12/15/17 1536    Education provided  Yes    Education Details  exercise technique     Person(s) Educated  Patient    Methods  Explanation;Demonstration;Verbal cues    Comprehension  Verbalized understanding;Returned demonstration       PT Short Term Goals - 12/09/17 1013      PT SHORT TERM GOAL #1   Title  After 3 weeks patient will demonstrate ability to perform 5xSTS or 4xSTS hands free in less tan 18 seconds.     Status  New      PT SHORT TERM GOAL #2   Title  After 3 weeks patient will demonstrate improved 10MWT in <8.0sec.     Status  New        PT Long Term Goals - 12/09/17 1014      PT LONG TERM GOAL #1   Title  After 6 weeks patient will demonstrate 5xSTS hands free in <17 seconds.       PT LONG TERM GOAL #2   Title  After 6 weeks patient will improve MMT by 1/2 grade in Right knee extension, ankle dorsiflexion, and hip abduction.     Status  New      PT LONG TERM GOAL #3   Title  After 6 weeks patient will demonstrate improved TUG <10sec to demnstrate decreased falls risk.     Status  New      PT LONG TERM GOAL #4   Title  After 6 weeks pt will demonstrate 6MWT with average gait speed >1.73m.s to improve ability to access th ecommunity ad lib.     Baseline  self selected gait speed is 0.90-0.4m/s over 277ft.     Status  New            Plan - 12/15/17 1540    Clinical Impression Statement  Patient challenged with RLE strengthening interventions due to weakness, however structured strengthening interventions were performed with good body  mechanics with good carryover to mobility. Right LE fatigues quicker than LLE at this time. Patient will continue to benefit from skilled physical therapy to improve balance, strength, and reduce fall risk for return to PLOF.  Rehab Potential  Good    PT Frequency  2x / week    PT Duration  6 weeks    PT Treatment/Interventions  ADLs/Self Care Home Management;Electrical Stimulation;Gait training;Moist Heat;Stair training;Functional mobility training;Therapeutic activities;Therapeutic exercise;Balance training;Neuromuscular re-education;Patient/family education;Cognitive remediation;Passive range of motion    PT Next Visit Plan  Review goals, establish HEP with handouts; general hip strengthening and basic balance training.     PT Home Exercise Plan  none established this visit, difficulty with printer     Consulted and Agree with Plan of Care  Patient       Patient will benefit from skilled therapeutic intervention in order to improve the following deficits and impairments:  Abnormal gait, Difficulty walking, Cardiopulmonary status limiting activity, Dizziness, Decreased endurance, Decreased activity tolerance, Decreased knowledge of precautions, Decreased balance, Decreased cognition, Decreased mobility, Decreased strength, Postural dysfunction  Visit Diagnosis: Unsteadiness on feet  Muscle weakness (generalized)     Problem List Patient Active Problem List   Diagnosis Date Noted  . Orthostatic hypotension 11/04/2017  . Hypothyroidism 11/04/2017  . OSA on CPAP 06/21/2017  . Persistent atrial fibrillation ([place]) 06/21/2017  . Cerebrovascular accident (CVA) due to embolism of left middle cerebral artery ([place]) 03/04/2017  . Osteoporosis 12/26/2015  . ANXIETY DEPRESSION 04/10/2010   Janna Arch, PT, DPT   12/15/2017, 3:56 PM  [place] MAIN [facility] SERVICES [address] [place], [place], [address] Phone: [phone]   Fax:   [phone]  Name: Monica Whitaker MRN: [id number] Date of Birth: [birth year]

## 2017-12-20 ENCOUNTER — Telehealth: Payer: Self-pay

## 2017-12-20 DIAGNOSIS — N3946 Mixed incontinence: Secondary | ICD-10-CM

## 2017-12-20 NOTE — Addendum Note (Signed)
Addended by: Jearld Fenton on: 12/20/2017 03:17 PM   Modules accepted: Orders

## 2017-12-20 NOTE — Telephone Encounter (Signed)
Referral to urology placed

## 2017-12-20 NOTE — Telephone Encounter (Signed)
Pt was seen 11/29/17 with dysuria and frequency.Please advise.

## 2017-12-20 NOTE — Telephone Encounter (Signed)
Copied from [place] [id number]. Topic: Referral - Request >> Dec 20, 2017  3:05 PM Aurelio Brash B wrote: Reason for CRM: Pt is requesting a referral for incontinence

## 2017-12-21 ENCOUNTER — Ambulatory Visit: Payer: Medicare Other | Admitting: Physical Therapy

## 2017-12-21 ENCOUNTER — Encounter: Payer: Self-pay | Admitting: Physical Therapy

## 2017-12-21 DIAGNOSIS — M6281 Muscle weakness (generalized): Secondary | ICD-10-CM | POA: Diagnosis not present

## 2017-12-21 DIAGNOSIS — R2681 Unsteadiness on feet: Secondary | ICD-10-CM | POA: Diagnosis not present

## 2017-12-21 NOTE — Therapy (Signed)
[place] MAIN [facility] SERVICES [address] [place], [place], [address] Phone: [phone]   Fax:  [phone]  Physical Therapy Treatment  Patient Details  Name: Monica Whitaker MRN: [id number] Date of Birth: 09-03-1945 Referring Provider: Linzie Collin, NP    Encounter Date: 12/21/2017  PT End of Session - 12/21/17 1259    Visit Number  4    Number of Visits  12    Date for PT Re-Evaluation  01/20/18    Authorization Type  4/10    Authorization Time Period  Eval on 12/09/17, reEvaluation on 01/20/18; (reassessment needed on 12/30/17.     PT Start Time  1300    PT Stop Time  1344    PT Time Calculation (min)  44 min    Activity Tolerance  Patient tolerated treatment well;Patient limited by fatigue    Behavior During Therapy  [facility] for tasks assessed/performed       Past Medical History:  Diagnosis Date  . Anxiety    controlled with meds  . Atrial fibrillation ([name])    paroxysmal, failed medical therapy with flecainide,  NSVT with tikosyn  . Bruises easily   . CVA (cerebral vascular accident) ([place]) 12/2007  . Depression    medically controlled   . Low blood pressure    no falls, but if gets up to fast  . Stroke ([place]) 7 years ago   Lasting effects on balance, different personality.  . Thyroid disease   . 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 ATRIAL FIBRILLATION ABLATION  10/18/12   PVI by Dr Rayann Heman  . ATRIAL FIBRILLATION ABLATION N/A 10/18/2012   Procedure: ATRIAL FIBRILLATION ABLATION;  Surgeon: Thompson Grayer, MD;  Location: [facility] CATH LAB;  Service: Cardiovascular;  Laterality: N/A;  . EYE SURGERY     on L/R eye, macular hole   . OPEN REDUCTION INTERNAL FIXATION (ORIF) DISTAL RADIAL FRACTURE Right 11/15/2014   Procedure: OPEN REDUCTION INTERNAL FIXATION (ORIF) RIGHT DISTAL RADIAL FRACTURE WITH ALLOGRAFT BONE GRAFT;  Surgeon: Roseanne Kaufman, MD;   Location: [facility];  Service: Orthopedics;  Laterality: Right;  . TEE WITHOUT CARDIOVERSION N/A 10/18/2012   Procedure: TRANSESOPHAGEAL ECHOCARDIOGRAM (TEE);  Surgeon: Lelon Perla, MD;  Location: [facility] ENDOSCOPY;  Service: Cardiovascular;  Laterality: N/A;  Pre-Ablation at 12pm  . TONSILLECTOMY    . VAGINAL HYSTERECTOMY      There were no vitals filed for this visit.  Subjective Assessment - 12/21/17 1305    Subjective  Pt went to yoga with her daughter a few days ago and was very sore but has since recovered.  Pt reports she is doing well.     Pertinent History  Pt reports sustaining a CVA in 2009 with involvement of RUE/RLE. She took PT most recently in 2017 to address some recurrent dysfunction in the RLE, difficulty walking, balance impairment. Pt reports she has noticed a decline in RLE about 6MA, started to do more walking and exercise classes at [facility], but function did not notably improve. She dscribes her RLE dysfunction as "clumsy and weak." Pt reports surface type does not make a large difference but uneven surfaces do make balance difficult. Pt reports 1 fall in the past 6 months.     How long can you sit comfortably?  Unlimited     How long can you stand comfortably?  not limited in ADL/IADL, but feels on edge, needing to focus on restraining multiplanar movements     How long can you walk comfortably?  Previously AMB 1 mile loop at [facility] with 2 trekking poles, but has not in several months; community distances not limited, but a cart improves confidence     Diagnostic tests  none recently     Patient Stated Goals  reduce falls, improve confidence, improve falls recovery     Currently in Pain?  No/denies         TREATMENT   Vitals taken in sitting at start of session: BP 93/47, SpO2 97%, pulse 70. Pt reports this BP is close to her baseline.   Bosu ball forward lunge 10x BUE support ; BLE   Bosu ball side lunge 10x BUE support, BLE   Squats with  BUE support 2x10 with cues for proper form and technique   Sit<>stand while powering up and pushing 3# bar overhead 2x10   BLE leg press 105# x10, 135# x10   Single limb Leg press #45 x10, 60# x10   R calf runner's stretch 2x45 seconds   Resisted walking at Matrix machine 7.5# x2 forward and x1 back/left/right                        PT Education - 12/21/17 1259    Education provided  Yes    Education Details  Exercise technique    Person(s) Educated  Patient    Methods  Explanation;Demonstration;Verbal cues    Comprehension  Verbalized understanding;Returned demonstration;Verbal cues required;Need further instruction       PT Short Term Goals - 12/09/17 1013      PT SHORT TERM GOAL #1   Title  After 3 weeks patient will demonstrate ability to perform 5xSTS or 4xSTS hands free in less tan 18 seconds.     Status  New      PT SHORT TERM GOAL #2   Title  After 3 weeks patient will demonstrate improved 10MWT in <8.0sec.     Status  New        PT Long Term Goals - 12/09/17 1014      PT LONG TERM GOAL #1   Title  After 6 weeks patient will demonstrate 5xSTS hands free in <17 seconds.       PT LONG TERM GOAL #2   Title  After 6 weeks patient will improve MMT by 1/2 grade in Right knee extension, ankle dorsiflexion, and hip abduction.     Status  New      PT LONG TERM GOAL #3   Title  After 6 weeks patient will demonstrate improved TUG <10sec to demnstrate decreased falls risk.     Status  New      PT LONG TERM GOAL #4   Title  After 6 weeks pt will demonstrate 6MWT with average gait speed >1.29m.s to improve ability to access th ecommunity ad lib.     Baseline  self selected gait speed is 0.90-0.2m/s over 248ft.     Status  New            Plan - 12/21/17 1309    Clinical Impression Statement  Pt required verbal cues for proper form and technique with strengthening exercises but tolerated all exercises well. Increased amount of weight for leg  press with RLE only and with BLE.  Pt demonstrated significant fear of falling with resisted walking exercise.  Pt will  benefit from continued skilled PT interventions for improved strength and safety with all aspects of mobility.      Rehab Potential  Good    PT Frequency  2x / week    PT Duration  6 weeks    PT Treatment/Interventions  ADLs/Self Care Home Management;Electrical Stimulation;Gait training;Moist Heat;Stair training;Functional mobility training;Therapeutic activities;Therapeutic exercise;Balance training;Neuromuscular re-education;Patient/family education;Cognitive remediation;Passive range of motion    PT Next Visit Plan  Review goals, establish HEP with handouts; general hip strengthening and basic balance training.     PT Home Exercise Plan  none established this visit, difficulty with printer     Consulted and Agree with Plan of Care  Patient       Patient will benefit from skilled therapeutic intervention in order to improve the following deficits and impairments:  Abnormal gait, Difficulty walking, Cardiopulmonary status limiting activity, Dizziness, Decreased endurance, Decreased activity tolerance, Decreased knowledge of precautions, Decreased balance, Decreased cognition, Decreased mobility, Decreased strength, Postural dysfunction  Visit Diagnosis: Unsteadiness on feet  Muscle weakness (generalized)     Problem List Patient Active Problem List   Diagnosis Date Noted  . Orthostatic hypotension 11/04/2017  . Hypothyroidism 11/04/2017  . OSA on CPAP 06/21/2017  . Persistent atrial fibrillation ([place]) 06/21/2017  . Cerebrovascular accident (CVA) due to embolism of left middle cerebral artery ([place]) 03/04/2017  . Osteoporosis 12/26/2015  . ANXIETY DEPRESSION 04/10/2010    Collie Siad PT, DPT 12/21/2017, 1:46 PM  [place] MAIN [facility] SERVICES [address] [place], [place], [address] Phone: [phone]   Fax:   [phone]  Name: Monica Whitaker MRN: [id number] Date of Birth: 1945/11/13

## 2017-12-22 DIAGNOSIS — L821 Other seborrheic keratosis: Secondary | ICD-10-CM | POA: Diagnosis not present

## 2017-12-22 DIAGNOSIS — L218 Other seborrheic dermatitis: Secondary | ICD-10-CM | POA: Diagnosis not present

## 2017-12-22 DIAGNOSIS — L570 Actinic keratosis: Secondary | ICD-10-CM | POA: Diagnosis not present

## 2017-12-22 DIAGNOSIS — L814 Other melanin hyperpigmentation: Secondary | ICD-10-CM | POA: Diagnosis not present

## 2017-12-23 ENCOUNTER — Ambulatory Visit: Payer: Medicare Other | Admitting: Physical Therapy

## 2017-12-23 ENCOUNTER — Encounter: Payer: Self-pay | Admitting: Physical Therapy

## 2017-12-23 DIAGNOSIS — R2681 Unsteadiness on feet: Secondary | ICD-10-CM | POA: Diagnosis not present

## 2017-12-23 DIAGNOSIS — M6281 Muscle weakness (generalized): Secondary | ICD-10-CM

## 2017-12-23 NOTE — Therapy (Signed)
[place] MAIN [facility] SERVICES [address] [place], [place], [address] Phone: [phone]   Fax:  [phone]  Physical Therapy Treatment  Patient Details  Name: Monica Whitaker MRN: [id number] Date of Birth: Mar 09, 1946 Referring Provider: Linzie Collin, NP    Encounter Date: 12/23/2017  PT End of Session - 12/23/17 1313    Visit Number  5    Number of Visits  12    Date for PT Re-Evaluation  01/20/18    Authorization Type  4/10    Authorization Time Period  Eval on 12/09/17, reEvaluation on 01/20/18; (reassessment needed on 12/30/17.     PT Start Time  0105    PT Stop Time  0145    PT Time Calculation (min)  40 min    Equipment Utilized During Treatment  Gait belt    Activity Tolerance  Patient tolerated treatment well;Patient limited by fatigue    Behavior During Therapy  W[facility] for tasks assessed/performed       Past Medical History:  Diagnosis Date  . Anxiety    controlled with meds  . Atrial fibrillation ([name])    paroxysmal, failed medical therapy with flecainide,  NSVT with tikosyn  . Bruises easily   . CVA (cerebral vascular accident) ([place]) 12/2007  . Depression    medically controlled   . Low blood pressure    no falls, but if gets up to fast  . Stroke ([place]) 7 years ago   Lasting effects on balance, different personality.  . Thyroid disease   . 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 ATRIAL FIBRILLATION ABLATION  10/18/12   PVI by Dr Rayann Heman  . ATRIAL FIBRILLATION ABLATION N/A 10/18/2012   Procedure: ATRIAL FIBRILLATION ABLATION;  Surgeon: Thompson Grayer, MD;  Location: [facility] CATH LAB;  Service: Cardiovascular;  Laterality: N/A;  . EYE SURGERY     on L/R eye, macular hole   . OPEN REDUCTION INTERNAL FIXATION (ORIF) DISTAL RADIAL FRACTURE Right 11/15/2014   Procedure: OPEN REDUCTION INTERNAL FIXATION (ORIF) RIGHT DISTAL RADIAL FRACTURE WITH  ALLOGRAFT BONE GRAFT;  Surgeon: Roseanne Kaufman, MD;  Location: [facility];  Service: Orthopedics;  Laterality: Right;  . TEE WITHOUT CARDIOVERSION N/A 10/18/2012   Procedure: TRANSESOPHAGEAL ECHOCARDIOGRAM (TEE);  Surgeon: Lelon Perla, MD;  Location: [facility] ENDOSCOPY;  Service: Cardiovascular;  Laterality: N/A;  Pre-Ablation at 12pm  . TONSILLECTOMY    . VAGINAL HYSTERECTOMY      There were no vitals filed for this visit.  Subjective Assessment - 12/23/17 1308    Subjective  Pt is doing fine today.   Pt reports she is doing well.     Pertinent History  Pt reports sustaining a CVA in 2009 with involvement of RUE/RLE. She took PT most recently in 2017 to address some recurrent dysfunction in the RLE, difficulty walking, balance impairment. Pt reports she has noticed a decline in RLE about 6MA, started to do more walking and exercise classes at [facility], but function did not notably improve. She dscribes her RLE dysfunction as "clumsy and weak." Pt reports surface type does not make a large difference but uneven surfaces do make balance difficult. Pt reports 1 fall in the past 6 months.     How long can you sit comfortably?  Unlimited     How long can you stand comfortably?  not limited in  ADL/IADL, but feels on edge, needing to focus on restraining multiplanar movements     How long can you walk comfortably?  Previously AMB 1 mile loop at [facility] with 2 trekking poles, but has not in several months; community distances not limited, but a cart improves confidence     Diagnostic tests  none recently     Patient Stated Goals  reduce falls, improve confidence, improve falls recovery     Currently in Pain?  No/denies    Multiple Pain Sites  No         Neuromuscular Re-education   Balloon tapping to mirror on blue foam pad x 30 seconds for 2 sets  , cues for technique and posture correction   standing on blue foam pad in // bars  x 1 minute, cues for technique and posture  correction  Step ups to blue foam pad x 10 bilaterally for 2 sets bilaterally  , cues for technique and posture correction  Matrix x 5 reps fwd/bwd/side stepping, cues for technique and posture correction  Tilt board, cues for technique and posture correction  Feet together on blue foam and holding ball x 1 minute, holding ball and trunk rotation  movement elbow , cues for technique and posture correction flex/ext x 20, horizontal abd/add with ball and arms extended., cues for technique and posture correction   Leg press 45 lbs heel raises,  90 lbs x 15 x 2 leg press     Patient demonstrates increased balance with intermittent use of hands. Patient has difficulty with turning  head and rotating his trunk with weight shifting exercises. Patient has loss of balance and needs UE support intermittently thorough out exercise.                     PT Education - 12/23/17 1313    Education provided  Yes    Education Details  HEP    Person(s) Educated  Patient    Methods  Explanation    Comprehension  Verbalized understanding       PT Short Term Goals - 12/09/17 1013      PT SHORT TERM GOAL #1   Title  After 3 weeks patient will demonstrate ability to perform 5xSTS or 4xSTS hands free in less tan 18 seconds.     Status  New      PT SHORT TERM GOAL #2   Title  After 3 weeks patient will demonstrate improved 10MWT in <8.0sec.     Status  New        PT Long Term Goals - 12/09/17 1014      PT LONG TERM GOAL #1   Title  After 6 weeks patient will demonstrate 5xSTS hands free in <17 seconds.       PT LONG TERM GOAL #2   Title  After 6 weeks patient will improve MMT by 1/2 grade in Right knee extension, ankle dorsiflexion, and hip abduction.     Status  New      PT LONG TERM GOAL #3   Title  After 6 weeks patient will demonstrate improved TUG <10sec to demnstrate decreased falls risk.     Status  New      PT LONG TERM GOAL #4   Title  After 6 weeks pt will  demonstrate 6MWT with average gait speed >1.98m.s to improve ability to access th ecommunity ad lib.     Baseline  self selected gait speed is 0.90-0.8m/s over 253ft.  Status  New            Plan - 12/23/17 1313    Clinical Impression Statement  Patient required min verbal cueing during matrix machine stepping, and required CGA during all dynamic standing balance activities. Patient required occasional rest breaks between exercises due to fatigue. Patient tolerated exercise well. Patient will continue to benefit from skilled therapy in order to improve dynamic standing balance activities and increase gait speed to reduce risk for falls    Rehab Potential  Good    PT Frequency  2x / week    PT Duration  6 weeks    PT Treatment/Interventions  ADLs/Self Care Home Management;Electrical Stimulation;Gait training;Moist Heat;Stair training;Functional mobility training;Therapeutic activities;Therapeutic exercise;Balance training;Neuromuscular re-education;Patient/family education;Cognitive remediation;Passive range of motion    PT Next Visit Plan  Review goals, establish HEP with handouts; general hip strengthening and basic balance training.     PT Home Exercise Plan  none established this visit, difficulty with printer     Consulted and Agree with Plan of Care  Patient       Patient will benefit from skilled therapeutic intervention in order to improve the following deficits and impairments:  Abnormal gait, Difficulty walking, Cardiopulmonary status limiting activity, Dizziness, Decreased endurance, Decreased activity tolerance, Decreased knowledge of precautions, Decreased balance, Decreased cognition, Decreased mobility, Decreased strength, Postural dysfunction  Visit Diagnosis: Unsteadiness on feet  Muscle weakness (generalized)     Problem List Patient Active Problem List   Diagnosis Date Noted  . Orthostatic hypotension 11/04/2017  . Hypothyroidism 11/04/2017  . OSA on CPAP  06/21/2017  . Persistent atrial fibrillation ([place]) 06/21/2017  . Cerebrovascular accident (CVA) due to embolism of left middle cerebral artery ([place]) 03/04/2017  . Osteoporosis 12/26/2015  . ANXIETY DEPRESSION 04/10/2010    Alanson Puls, PT  DPT 12/23/2017, 1:17 PM  [place] MAIN [facility] SERVICES [address] [place], [place], [address] Phone: [phone]   Fax:  [phone]  Name: Monica Whitaker MRN: [id number] Date of Birth: 12/27/1945

## 2017-12-30 ENCOUNTER — Ambulatory Visit: Payer: Medicare Other

## 2018-01-04 ENCOUNTER — Ambulatory Visit: Payer: Medicare Other | Attending: Internal Medicine

## 2018-01-04 DIAGNOSIS — R2681 Unsteadiness on feet: Secondary | ICD-10-CM | POA: Insufficient documentation

## 2018-01-04 DIAGNOSIS — M6281 Muscle weakness (generalized): Secondary | ICD-10-CM

## 2018-01-04 NOTE — Therapy (Signed)
[place] MAIN [facility] SERVICES [address] [place], [place], [address] Phone: [phone]   Fax:  [phone]  Physical Therapy Treatment  Patient Details  Name: Monica Whitaker MRN: [id number] Date of Birth: 1946/01/22 Referring Provider: Linzie Collin, NP    Encounter Date: 01/04/2018  PT End of Session - 01/04/18 1251    Visit Number  6    Number of Visits  12    Date for PT Re-Evaluation  01/20/18    Authorization Type  5/10    Authorization Time Period  Eval on 12/09/17, reEvaluation on 01/20/18; (reassessment needed on 12/30/17.     PT Start Time  1259    PT Stop Time  1344    PT Time Calculation (min)  45 min    Equipment Utilized During Treatment  Gait belt    Activity Tolerance  Patient tolerated treatment well;Patient limited by fatigue    Behavior During Therapy  WFL for tasks assessed/performed       Past Medical History:  Diagnosis Date  . Anxiety    controlled with meds  . Atrial fibrillation ([name])    paroxysmal, failed medical therapy with flecainide,  NSVT with tikosyn  . Bruises easily   . CVA (cerebral vascular accident) ([place]) 12/2007  . Depression    medically controlled   . Low blood pressure    no falls, but if gets up to fast  . Stroke ([place]) 7 years ago   Lasting effects on balance, different personality.  . Thyroid disease   . 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 ATRIAL FIBRILLATION ABLATION  10/18/12   PVI by Dr Rayann Heman  . ATRIAL FIBRILLATION ABLATION N/A 10/18/2012   Procedure: ATRIAL FIBRILLATION ABLATION;  Surgeon: Thompson Grayer, MD;  Location: [facility] CATH LAB;  Service: Cardiovascular;  Laterality: N/A;  . EYE SURGERY     on L/R eye, macular hole   . OPEN REDUCTION INTERNAL FIXATION (ORIF) DISTAL RADIAL FRACTURE Right 11/15/2014   Procedure: OPEN REDUCTION INTERNAL FIXATION (ORIF) RIGHT DISTAL RADIAL FRACTURE WITH  ALLOGRAFT BONE GRAFT;  Surgeon: Roseanne Kaufman, MD;  Location: [place];  Service: Orthopedics;  Laterality: Right;  . TEE WITHOUT CARDIOVERSION N/A 10/18/2012   Procedure: TRANSESOPHAGEAL ECHOCARDIOGRAM (TEE);  Surgeon: Lelon Perla, MD;  Location: [facility] ENDOSCOPY;  Service: Cardiovascular;  Laterality: N/A;  Pre-Ablation at 12pm  . TONSILLECTOMY    . VAGINAL HYSTERECTOMY      There were no vitals filed for this visit.  Subjective Assessment - 01/04/18 1302    Subjective  Patient reports husband had a fall and went to ED after her last session. Was in hospital for 6 days then in home health rehab. Is home alone now until husband returns home.     Pertinent History  Pt reports sustaining a CVA in 2009 with involvement of RUE/RLE. She took PT most recently in 2017 to address some recurrent dysfunction in the RLE, difficulty walking, balance impairment. Pt reports she has noticed a decline in RLE about 6MA, started to do more walking and exercise classes at [facility], but function did not notably improve. She dscribes her RLE dysfunction as "clumsy and weak." Pt reports surface type does not make a large difference but uneven surfaces do make balance difficult. Pt reports 1 fall in the past 6 months.     How long  can you sit comfortably?  Unlimited     How long can you stand comfortably?  not limited in ADL/IADL, but feels on edge, needing to focus on restraining multiplanar movements     How long can you walk comfortably?  Previously AMB 1 mile loop at [facility] with 2 trekking poles, but has not in several months; community distances not limited, but a cart improves confidence     Diagnostic tests  none recently     Patient Stated Goals  reduce falls, improve confidence, improve falls recovery     Currently in Pain?  No/denies        5xSTS hands free: 17 seconds 10 MWT: 7 seconds MMT: 4/5  TUG : 10.5 seconds 6 MWT: 1386 ft  Stair negotiation: requires SUE support x 4  trials ascending and descending recirpical pattern  DGI 17/24  Ambulate up and curb without UE support with CGA and verbal cues for upright posture outside 6x.    Ambulate across changing surfaces from stable to unstable, sidewalk onto grass and back, across unstable grassy walk with CGA and cues for step length and posture  Pt. response to medical necessity:  Patient will continue to benefit from skilled therapy in order to improve dynamic standing balance activities and increase gait speed to reduce risk for falls                         PT Education - 01/04/18 1251    Education provided  Yes    Education Details  goal progression, exercise technique     Person(s) Educated  Patient    Methods  Explanation;Demonstration;Verbal cues    Comprehension  Verbalized understanding;Returned demonstration       PT Short Term Goals - 01/04/18 1310      PT SHORT TERM GOAL #1   Title  After 3 weeks patient will demonstrate ability to perform 5xSTS or 4xSTS hands free in less tan 18 seconds.     Baseline  17 seconds    Status  Achieved      PT SHORT TERM GOAL #2   Title  After 3 weeks patient will demonstrate improved 10MWT in <8.0sec.     Baseline  7 seconds    Status  Achieved        PT Long Term Goals - 01/04/18 1306      PT LONG TERM GOAL #1   Title  After 6 weeks patient will demonstrate 5xSTS hands free in <17 seconds.     Baseline  6/4: 17 seconds    Time  4    Period  Weeks    Status  New    Target Date  01/04/18      PT LONG TERM GOAL #2   Title  After 6 weeks patient will improve MMT by 1/2 grade in Right knee extension, ankle dorsiflexion, and hip abduction.     Baseline  4/5    Time  6    Period  Weeks    Status  Partially Met      PT LONG TERM GOAL #3   Title  After 6 weeks patient will demonstrate improved TUG <10sec to demnstrate decreased falls risk.     Baseline  6/4: 10.5 seconds     Time  6    Period  Weeks    Status  Partially  Met    Target Date  02/15/18      PT LONG TERM  GOAL #4   Title  After 6 weeks pt will demonstrate 6MWT with average gait speed >1.16ms to improve ability to access th ecommunity ad lib.     Baseline  1380 ft which is >1.0 m/s     Status  Achieved      PT LONG TERM GOAL #5   Title  Patient will demonstrate 5xSTS hands free in <15 seconds to improve mobility    Baseline  6/4: 15 seconds    Time  6    Period  Weeks    Status  New      Additional Long Term Goals   Additional Long Term Goals  Yes      PT LONG TERM GOAL #6   Title  Patient will improve DGI score to >21 to demonstrate improved stability with ambulation.     Baseline  6/4: 17     Time  6    Period  Weeks    Status  New    Target Date  02/15/18            Plan - 01/04/18 1438    Clinical Impression Statement  Patient reports continues to be challenged by stairs. Has to place emphasis on stepping off of sidewalk/curbs, changing surfaces. Current goals have significant progress/met at this time with TUG =10.5 seconds, 6 min =1386, 5x STS 17 seconds, 10 MWT= 7 seconds. DGI challenging with single limb tasks with score of 17. Patient will continue to benefit from skilled therapy in order to improve dynamic standing balance activities and increase gait speed to reduce risk for falls    Rehab Potential  Good    PT Frequency  2x / week    PT Duration  6 weeks    PT Treatment/Interventions  ADLs/Self Care Home Management;Electrical Stimulation;Gait training;Moist Heat;Stair training;Functional mobility training;Therapeutic activities;Therapeutic exercise;Balance training;Neuromuscular re-education;Patient/family education;Cognitive remediation;Passive range of motion    PT Next Visit Plan  single leg stability, walk across unstable surfaces, stairs.      PT Home Exercise Plan  none established this visit, difficulty with printer     Consulted and Agree with Plan of Care  Patient       Patient will benefit from skilled  therapeutic intervention in order to improve the following deficits and impairments:  Abnormal gait, Difficulty walking, Cardiopulmonary status limiting activity, Dizziness, Decreased endurance, Decreased activity tolerance, Decreased knowledge of precautions, Decreased balance, Decreased cognition, Decreased mobility, Decreased strength, Postural dysfunction  Visit Diagnosis: Unsteadiness on feet  Muscle weakness (generalized)     Problem List Patient Active Problem List   Diagnosis Date Noted  . Orthostatic hypotension 11/04/2017  . Hypothyroidism 11/04/2017  . OSA on CPAP 06/21/2017  . Persistent atrial fibrillation ([place] 06/21/2017  . Cerebrovascular accident (CVA) due to embolism of left middle cerebral artery ([place] 03/04/2017  . Osteoporosis 12/26/2015  . ANXIETY DEPRESSION 04/10/2010   MJanna Arch PT, DPT   01/04/2018, 2:39 PM  [place] [facility]ERV[facility] [address] [address]Phone: [phone]  Fax:  [phone] Name: Monica SOUSAMRN: [id number] of Birth: 107-11-1945

## 2018-01-06 ENCOUNTER — Ambulatory Visit: Payer: Medicare Other

## 2018-01-06 DIAGNOSIS — M6281 Muscle weakness (generalized): Secondary | ICD-10-CM

## 2018-01-06 DIAGNOSIS — R2681 Unsteadiness on feet: Secondary | ICD-10-CM | POA: Diagnosis not present

## 2018-01-06 NOTE — Therapy (Signed)
[place] MAIN [facility] SERVICES [address] [place], [place], [address] Phone: [phone]   Fax:  [phone]  Physical Therapy Treatment  Patient Details  Name: Monica Whitaker MRN: [id number] Date of Birth: 1946/06/16 Referring Provider: Linzie Collin, NP    Encounter Date: 01/06/2018  PT End of Session - 01/06/18 1255    Visit Number  7    Number of Visits  12    Date for PT Re-Evaluation  01/20/18    Authorization Type  6/10    Authorization Time Period  Eval on 12/09/17, reEvaluation on 01/20/18; (reassessment needed on 12/30/17.     PT Start Time  1259    PT Stop Time  1344    PT Time Calculation (min)  45 min    Equipment Utilized During Treatment  Gait belt    Activity Tolerance  Patient tolerated treatment well;Patient limited by fatigue    Behavior During Therapy  WFL for tasks assessed/performed       Past Medical History:  Diagnosis Date  . Anxiety    controlled with meds  . Atrial fibrillation ([name])    paroxysmal, failed medical therapy with flecainide,  NSVT with tikosyn  . Bruises easily   . CVA (cerebral vascular accident) ([place]) 12/2007  . Depression    medically controlled   . Low blood pressure    no falls, but if gets up to fast  . Stroke ([place]) 7 years ago   Lasting effects on balance, different personality.  . Thyroid disease   . 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 ATRIAL FIBRILLATION ABLATION  10/18/12   PVI by Dr Rayann Heman  . ATRIAL FIBRILLATION ABLATION N/A 10/18/2012   Procedure: ATRIAL FIBRILLATION ABLATION;  Surgeon: Thompson Grayer, MD;  Location: [facility] CATH LAB;  Service: Cardiovascular;  Laterality: N/A;  . EYE SURGERY     on L/R eye, macular hole   . OPEN REDUCTION INTERNAL FIXATION (ORIF) DISTAL RADIAL FRACTURE Right 11/15/2014   Procedure: OPEN REDUCTION INTERNAL FIXATION (ORIF) RIGHT DISTAL RADIAL FRACTURE WITH  ALLOGRAFT BONE GRAFT;  Surgeon: Roseanne Kaufman, MD;  Location: [place];  Service: Orthopedics;  Laterality: Right;  . TEE WITHOUT CARDIOVERSION N/A 10/18/2012   Procedure: TRANSESOPHAGEAL ECHOCARDIOGRAM (TEE);  Surgeon: Lelon Perla, MD;  Location: [facility] ENDOSCOPY;  Service: Cardiovascular;  Laterality: N/A;  Pre-Ablation at 12pm  . TONSILLECTOMY    . VAGINAL HYSTERECTOMY      There were no vitals filed for this visit.  Subjective Assessment - 01/06/18 1302    Subjective  Patient went to exercise class this morning. Went to visit husband in skilled nursing as he is recovering.     Pertinent History  Pt reports sustaining a CVA in 2009 with involvement of RUE/RLE. She took PT most recently in 2017 to address some recurrent dysfunction in the RLE, difficulty walking, balance impairment. Pt reports she has noticed a decline in RLE about 6MA, started to do more walking and exercise classes at [facility], but function did not notably improve. She dscribes her RLE dysfunction as "clumsy and weak." Pt reports surface type does not make a large difference but uneven surfaces do make balance difficult. Pt reports 1 fall in the past 6 months.     How long can you sit comfortably?  Unlimited     How long can you stand  comfortably?  not limited in ADL/IADL, but feels on edge, needing to focus on restraining multiplanar movements     How long can you walk comfortably?  Previously AMB 1 mile loop at [facility] with 2 trekking poles, but has not in several months; community distances not limited, but a cart improves confidence     Diagnostic tests  none recently     Patient Stated Goals  reduce falls, improve confidence, improve falls recovery     Currently in Pain?  No/denies       NuStep Lvl 4 4 minutes no LOB.   Resisted walking on Matrix Machine over unstable mat  3x sideways,; patient declined other directions due to fear #7.5  Walk across mat in // bars 2x  Walk across unstable matt  in // bars holding onto 5lb weight in one hand 4x length of bars  BOSU static balance in // bars 4x30 seconds.   Side lunges bosu ball 16x each leg.   Eccentric step down in // bars 4" step 10x each leg ; 2 sets   3lb ankle weights seated  LAQ 10x 2 sets   Walk in hallway with ball throw for vertical head raises 200 ft with CGA  Horizontal ball twists walking in hallway 200 ft with CGA   Pt. response to medical necessity:   Patient will continue to benefit from skilled therapy in order to improve dynamic standing balance activities and increase gait speed to reduce risk for falls                          PT Education - 01/06/18 1254    Education provided  Yes    Education Details  exercise technique    Person(s) Educated  Patient    Methods  Explanation;Demonstration;Verbal cues    Comprehension  Verbalized understanding;Returned demonstration       PT Short Term Goals - 01/04/18 1310      PT SHORT TERM GOAL #1   Title  After 3 weeks patient will demonstrate ability to perform 5xSTS or 4xSTS hands free in less tan 18 seconds.     Baseline  17 seconds    Status  Achieved      PT SHORT TERM GOAL #2   Title  After 3 weeks patient will demonstrate improved 10MWT in <8.0sec.     Baseline  7 seconds    Status  Achieved        PT Long Term Goals - 01/04/18 1306      PT LONG TERM GOAL #1   Title  After 6 weeks patient will demonstrate 5xSTS hands free in <17 seconds.     Baseline  6/4: 17 seconds    Time  4    Period  Weeks    Status  New    Target Date  01/04/18      PT LONG TERM GOAL #2   Title  After 6 weeks patient will improve MMT by 1/2 grade in Right knee extension, ankle dorsiflexion, and hip abduction.     Baseline  4/5    Time  6    Period  Weeks    Status  Partially Met      PT LONG TERM GOAL #3   Title  After 6 weeks patient will demonstrate improved TUG <10sec to demnstrate decreased falls risk.     Baseline  6/4: 10.5  seconds     Time  6  Period  Weeks    Status  Partially Met    Target Date  02/15/18      PT LONG TERM GOAL #4   Title  After 6 weeks pt will demonstrate 6MWT with average gait speed >1.64ms to improve ability to access th ecommunity ad lib.     Baseline  1380 ft which is >1.0 m/s     Status  Achieved      PT LONG TERM GOAL #5   Title  Patient will demonstrate 5xSTS hands free in <15 seconds to improve mobility    Baseline  6/4: 15 seconds    Time  6    Period  Weeks    Status  New      Additional Long Term Goals   Additional Long Term Goals  Yes      PT LONG TERM GOAL #6   Title  Patient will improve DGI score to >21 to demonstrate improved stability with ambulation.     Baseline  6/4: 17     Time  6    Period  Weeks    Status  New    Target Date  02/15/18            Plan - 01/06/18 1334    Clinical Impression Statement  Patient challenged when negotiating unstable surfaces with combined fear of falling and weaker RLE limiting self. Patient performed improved balance with repetition with last rep demonstrating decreased instability than first rep. Patient will continue to benefit from skilled therapy in order to improve dynamic standing balance activities and increase gait speed to reduce risk for falls    Rehab Potential  Good    PT Frequency  2x / week    PT Duration  6 weeks    PT Treatment/Interventions  ADLs/Self Care Home Management;Electrical Stimulation;Gait training;Moist Heat;Stair training;Functional mobility training;Therapeutic activities;Therapeutic exercise;Balance training;Neuromuscular re-education;Patient/family education;Cognitive remediation;Passive range of motion    PT Next Visit Plan  single leg stability, walk across unstable surfaces, stairs.      PT Home Exercise Plan  none established this visit, difficulty with printer     Consulted and Agree with Plan of Care  Patient       Patient will benefit from skilled therapeutic intervention in  order to improve the following deficits and impairments:  Abnormal gait, Difficulty walking, Cardiopulmonary status limiting activity, Dizziness, Decreased endurance, Decreased activity tolerance, Decreased knowledge of precautions, Decreased balance, Decreased cognition, Decreased mobility, Decreased strength, Postural dysfunction  Visit Diagnosis: Unsteadiness on feet  Muscle weakness (generalized)     Problem List Patient Active Problem List   Diagnosis Date Noted  . Orthostatic hypotension 11/04/2017  . Hypothyroidism 11/04/2017  . OSA on CPAP 06/21/2017  . Persistent atrial fibrillation ([place] 06/21/2017  . Cerebrovascular accident (CVA) due to embolism of left middle cerebral artery ([place] 03/04/2017  . Osteoporosis 12/26/2015  . ANXIETY DEPRESSION 04/10/2010   Monica Whitaker PT, DPT   01/06/2018, 1:45 PM  CP[place]MAIN RPhys[facility] [address] [address]Phone: [phone]  Fax:  [phone] Name: MMYKENZIE EBANKSMRN: [id number] of Birth: 106/08/1945

## 2018-01-11 ENCOUNTER — Ambulatory Visit: Payer: Medicare Other

## 2018-01-13 ENCOUNTER — Ambulatory Visit: Payer: Medicare Other

## 2018-01-13 DIAGNOSIS — M6281 Muscle weakness (generalized): Secondary | ICD-10-CM | POA: Diagnosis not present

## 2018-01-13 DIAGNOSIS — R2681 Unsteadiness on feet: Secondary | ICD-10-CM

## 2018-01-13 NOTE — Therapy (Signed)
[place] MAIN [facility] SERVICES [address] [place], [place], [address] Phone: [phone]   Fax:  [phone]  Physical Therapy Treatment  Patient Details  Name: Monica Whitaker MRN: [id number] Date of Birth: [birth year] Referring Provider: Linzie Collin, NP    Encounter Date: 01/13/2018  PT End of Session - 01/13/18 1304    Visit Number  8    Number of Visits  12    Date for PT Re-Evaluation  01/20/18    Authorization Type  7/10    Authorization Time Period  Eval on 12/09/17, reEvaluation on 01/20/18;    PT Start Time  1259    PT Stop Time  1344    PT Time Calculation (min)  45 min    Equipment Utilized During Treatment  Gait belt    Activity Tolerance  Patient tolerated treatment well;Patient limited by fatigue    Behavior During Therapy  [facility] for tasks assessed/performed       Past Medical History:  Diagnosis Date  . Anxiety    controlled with meds  . Atrial fibrillation ([name])    paroxysmal, failed medical therapy with flecainide,  NSVT with tikosyn  . Bruises easily   . CVA (cerebral vascular accident) ([place]) 12/2007  . Depression    medically controlled   . Low blood pressure    no falls, but if gets up to fast  . Stroke ([place]) 7 years ago   Lasting effects on balance, different personality.  . Thyroid disease   . 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 ATRIAL FIBRILLATION ABLATION  10/18/12   PVI by Dr Rayann Heman  . ATRIAL FIBRILLATION ABLATION N/A 10/18/2012   Procedure: ATRIAL FIBRILLATION ABLATION;  Surgeon: Thompson Grayer, MD;  Location: [facility] CATH LAB;  Service: Cardiovascular;  Laterality: N/A;  . EYE SURGERY     on L/R eye, macular hole   . OPEN REDUCTION INTERNAL FIXATION (ORIF) DISTAL RADIAL FRACTURE Right 11/15/2014   Procedure: OPEN REDUCTION INTERNAL FIXATION (ORIF) RIGHT DISTAL RADIAL FRACTURE WITH ALLOGRAFT BONE GRAFT;  Surgeon: Roseanne Kaufman, MD;  Location: [place];  Service: Orthopedics;  Laterality: Right;  . TEE WITHOUT CARDIOVERSION N/A 10/18/2012   Procedure: TRANSESOPHAGEAL ECHOCARDIOGRAM (TEE);  Surgeon: Lelon Perla, MD;  Location: [facility] ENDOSCOPY;  Service: Cardiovascular;  Laterality: N/A;  Pre-Ablation at 12pm  . TONSILLECTOMY    . VAGINAL HYSTERECTOMY      There were no vitals filed for this visit.  Subjective Assessment - 01/13/18 1303    Subjective  Patient had bad news regarding financial coverage of husband. Is visibly upset and concerned.     Pertinent History  Pt reports sustaining a CVA in 2009 with involvement of RUE/RLE. She took PT most recently in 2017 to address some recurrent dysfunction in the RLE, difficulty walking, balance impairment. Pt reports she has noticed a decline in RLE about 6MA, started to do more walking and exercise classes at [facility], but function did not notably improve. She dscribes her RLE dysfunction as "clumsy and weak." Pt reports surface type does not make a large difference but uneven surfaces do make balance difficult. Pt reports 1 fall in the past 6 months.     How long can you sit comfortably?  Unlimited     How long can you stand comfortably?  not limited in ADL/IADL, but feels on  edge, needing to focus on restraining multiplanar movements     How long can you walk comfortably?  Previously AMB 1 mile loop at [facility] with 2 trekking poles, but has not in several months; community distances not limited, but a cart improves confidence     Diagnostic tests  none recently     Patient Stated Goals  reduce falls, improve confidence, improve falls recovery     Currently in Pain?  No/denies       NuStep Lvl 5 4 minutes no LOB.    Resisted walking on Matrix Machine 3x sideways, forwards, backwards; #7.5    Ambulate over stable and unstable surfaces outside: transition from grass to concrete, brick to grass, etc no LOB, CGA >800 ft. Noted decreased knee  extension with fatigue, as well as decreased arm swing.   Ambulate on unstable brick surface with pinecones in path for path negotiation outside.   Eccentric step down in // bars 4" step 10x each leg ; 2 sets   Ambulate around 6 cones holding onto upside down cone with ball inside for balance/staility x6 trials    3lb ankle weights seated             LAQ 10x 2 sets ; 3 second holds, cues for upright posture.    Walk in hallway with ball throw for vertical head raises 200 ft with CGA   Horizontal ball twists walking in hallway 200 ft with CGA   Pt. response to medical necessity:   Patient will continue to benefit from skilled therapy in order to improve dynamic standing balance activities and increase gait speed to reduce risk for falls                         PT Education - 01/13/18 1304    Education provided  Yes    Education Details  exercise technique, stability     Person(s) Educated  Patient    Methods  Explanation;Demonstration;Handout    Comprehension  Verbalized understanding;Returned demonstration       PT Short Term Goals - 01/04/18 1310      PT SHORT TERM GOAL #1   Title  After 3 weeks patient will demonstrate ability to perform 5xSTS or 4xSTS hands free in less tan 18 seconds.     Baseline  17 seconds    Status  Achieved      PT SHORT TERM GOAL #2   Title  After 3 weeks patient will demonstrate improved 10MWT in <8.0sec.     Baseline  7 seconds    Status  Achieved        PT Long Term Goals - 01/04/18 1306      PT LONG TERM GOAL #1   Title  After 6 weeks patient will demonstrate 5xSTS hands free in <17 seconds.     Baseline  6/4: 17 seconds    Time  4    Period  Weeks    Status  New    Target Date  01/04/18      PT LONG TERM GOAL #2   Title  After 6 weeks patient will improve MMT by 1/2 grade in Right knee extension, ankle dorsiflexion, and hip abduction.     Baseline  4/5    Time  6    Period  Weeks    Status  Partially Met       PT LONG TERM GOAL #3   Title  After 6  weeks patient will demonstrate improved TUG <10sec to demnstrate decreased falls risk.     Baseline  6/4: 10.5 seconds     Time  6    Period  Weeks    Status  Partially Met    Target Date  02/15/18      PT LONG TERM GOAL #4   Title  After 6 weeks pt will demonstrate 6MWT with average gait speed >1.36ms to improve ability to access th ecommunity ad lib.     Baseline  1380 ft which is >1.0 m/s     Status  Achieved      PT LONG TERM GOAL #5   Title  Patient will demonstrate 5xSTS hands free in <15 seconds to improve mobility    Baseline  6/4: 15 seconds    Time  6    Period  Weeks    Status  New      Additional Long Term Goals   Additional Long Term Goals  Yes      PT LONG TERM GOAL #6   Title  Patient will improve DGI score to >21 to demonstrate improved stability with ambulation.     Baseline  6/4: 17     Time  6    Period  Weeks    Status  New    Target Date  02/15/18            Plan - 01/13/18 1340    Clinical Impression Statement  Patient demonstrated ability to negotiate alternating types of terrain without LOB however was very nervous and required frequent encouragement and CGA. Patient's confidence in ability will continue to need fostering while improving dynamic stability. Patient will continue to benefit from skilled therapy in order to improve dynamic standing balance activities and increase gait speed to reduce risk for falls    Rehab Potential  Good    PT Frequency  2x / week    PT Duration  6 weeks    PT Treatment/Interventions  ADLs/Self Care Home Management;Electrical Stimulation;Gait training;Moist Heat;Stair training;Functional mobility training;Therapeutic activities;Therapeutic exercise;Balance training;Neuromuscular re-education;Patient/family education;Cognitive remediation;Passive range of motion    PT Next Visit Plan  single leg stability, walk across unstable surfaces, stairs.      PT Home Exercise Plan   none established this visit, difficulty with printer     Consulted and Agree with Plan of Care  Patient       Patient will benefit from skilled therapeutic intervention in order to improve the following deficits and impairments:  Abnormal gait, Difficulty walking, Cardiopulmonary status limiting activity, Dizziness, Decreased endurance, Decreased activity tolerance, Decreased knowledge of precautions, Decreased balance, Decreased cognition, Decreased mobility, Decreased strength, Postural dysfunction  Visit Diagnosis: Unsteadiness on feet  Muscle weakness (generalized)     Problem List Patient Active Problem List   Diagnosis Date Noted  . Orthostatic hypotension 11/04/2017  . Hypothyroidism 11/04/2017  . OSA on CPAP 06/21/2017  . Persistent atrial fibrillation ([place] 06/21/2017  . Cerebrovascular accident (CVA) due to embolism of left middle cerebral artery ([place] 03/04/2017  . Osteoporosis 12/26/2015  . ANXIETY DEPRESSION 04/10/2010   MJanna Arch PT, DPT   01/13/2018, 1:44 PM  [place]MAIN [facility]ERVICES [address] [address]Phone: [phone]  Fax:  [phone] Name: Monica PAULUSMRN: [id number] of Birth: [birth year]

## 2018-01-17 ENCOUNTER — Other Ambulatory Visit (HOSPITAL_COMMUNITY): Payer: Self-pay | Admitting: *Deleted

## 2018-01-17 MED ORDER — FLECAINIDE ACETATE 100 MG PO TABS: 100.0000 mg | ORAL_TABLET | Freq: Two times a day (BID) | ORAL | 2 refills | Status: DC

## 2018-01-18 ENCOUNTER — Ambulatory Visit: Payer: Medicare Other

## 2018-01-18 DIAGNOSIS — M6281 Muscle weakness (generalized): Secondary | ICD-10-CM

## 2018-01-18 DIAGNOSIS — R2681 Unsteadiness on feet: Secondary | ICD-10-CM | POA: Diagnosis not present

## 2018-01-18 NOTE — Patient Instructions (Signed)
Access Code: [email or web address]  URL: [email or web address]  Date: 01/18/2018  Prepared by: Janna Arch   Exercises  Standing Tandem Balance with Counter Support - 2 reps - 1 sets - 30 hold - 1x daily - 7x weekly  Single Leg Balance in March Position - 10 reps - 1 sets - 3 hold - 1x daily - 7x weekly

## 2018-01-18 NOTE — Therapy (Signed)
[place] MAIN [facility] SERVICES [address] [place], [place], [address] Phone: [phone]   Fax:  [phone]  Physical Therapy Treatment  Patient Details  Name: Monica Whitaker MRN: [id number] Date of Birth: 21-Jan-1946 Referring Provider: Linzie Collin, NP    Encounter Date: 01/18/2018  PT End of Session - 01/18/18 1304    Visit Number  9    Number of Visits  12    Date for PT Re-Evaluation  01/20/18    Authorization Type  8/10    Authorization Time Period  Eval on 12/09/17, reEvaluation on 01/20/18;    PT Start Time  1259    PT Stop Time  1340    PT Time Calculation (min)  41 min    Equipment Utilized During Treatment  Gait belt    Activity Tolerance  Patient tolerated treatment well    Behavior During Therapy  [facility] for tasks assessed/performed       Past Medical History:  Diagnosis Date  . Anxiety    controlled with meds  . Atrial fibrillation ([name])    paroxysmal, failed medical therapy with flecainide,  NSVT with tikosyn  . Bruises easily   . CVA (cerebral vascular accident) ([place]) 12/2007  . Depression    medically controlled   . Low blood pressure    no falls, but if gets up to fast  . Stroke ([place]) 7 years ago   Lasting effects on balance, different personality.  . Thyroid disease   . 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 ATRIAL FIBRILLATION ABLATION  10/18/12   PVI by Dr Rayann Heman  . ATRIAL FIBRILLATION ABLATION N/A 10/18/2012   Procedure: ATRIAL FIBRILLATION ABLATION;  Surgeon: Thompson Grayer, MD;  Location: [facility] CATH LAB;  Service: Cardiovascular;  Laterality: N/A;  . EYE SURGERY     on L/R eye, macular hole   . OPEN REDUCTION INTERNAL FIXATION (ORIF) DISTAL RADIAL FRACTURE Right 11/15/2014   Procedure: OPEN REDUCTION INTERNAL FIXATION (ORIF) RIGHT DISTAL RADIAL FRACTURE WITH ALLOGRAFT BONE GRAFT;  Surgeon: Roseanne Kaufman, MD;  Location:  [place];  Service: Orthopedics;  Laterality: Right;  . TEE WITHOUT CARDIOVERSION N/A 10/18/2012   Procedure: TRANSESOPHAGEAL ECHOCARDIOGRAM (TEE);  Surgeon: Lelon Perla, MD;  Location: [facility] ENDOSCOPY;  Service: Cardiovascular;  Laterality: N/A;  Pre-Ablation at 12pm  . TONSILLECTOMY    . VAGINAL HYSTERECTOMY      There were no vitals filed for this visit.  Subjective Assessment - 01/18/18 1302    Subjective  Patient reports having difficulty at home due to husbands medical problems. Today will be last visit.     Pertinent History  Pt reports sustaining a CVA in 2009 with involvement of RUE/RLE. She took PT most recently in 2017 to address some recurrent dysfunction in the RLE, difficulty walking, balance impairment. Pt reports she has noticed a decline in RLE about 6MA, started to do more walking and exercise classes at [facility], but function did not notably improve. She dscribes her RLE dysfunction as "clumsy and weak." Pt reports surface type does not make a large difference but uneven surfaces do make balance difficult. Pt reports 1 fall in the past 6 months.     How long can you sit comfortably?  Unlimited     How long can you stand comfortably?  not limited in ADL/IADL, but feels on edge,  needing to focus on restraining multiplanar movements     How long can you walk comfortably?  Previously AMB 1 mile loop at [facility] with 2 trekking poles, but has not in several months; community distances not limited, but a cart improves confidence     Diagnostic tests  none recently     Patient Stated Goals  reduce falls, improve confidence, improve falls recovery     Currently in Pain?  No/denies     5x STS: 14 seconds MMT: 4+/5 TUG: 9 seconds DGI: 21/24  Eccentric step down in // bars 6" step 20x each leg ;single UE support  Step up down 6 " single UE support 10x each leg.   Side lunge bosu ball 15x each leg, BUE support   SLS marching in // bars 15x each side.   tandem stance in // bars 2x30 seconds each leg  HEP review   [facility] PT Assessment - 01/18/18 0001      Dynamic Gait Index   Level Surface  Normal    Change in Gait Speed  Normal    Gait with Horizontal Head Turns  Mild Impairment    Gait with Vertical Head Turns  Mild Impairment    Gait and Pivot Turn  Normal    Step Over Obstacle  Normal    Step Around Obstacles  Normal    Steps  Mild Impairment    Total Score  21                            PT Education - 01/18/18 1303    Education provided  Yes    Education Details  POC, DC    Person(s) Educated  Patient    Methods  Explanation    Comprehension  Verbalized understanding       PT Short Term Goals - 01/04/18 1310      PT SHORT TERM GOAL #1   Title  After 3 weeks patient will demonstrate ability to perform 5xSTS or 4xSTS hands free in less tan 18 seconds.     Baseline  17 seconds    Status  Achieved      PT SHORT TERM GOAL #2   Title  After 3 weeks patient will demonstrate improved 10MWT in <8.0sec.     Baseline  7 seconds    Status  Achieved        PT Long Term Goals - 01/18/18 1309      PT LONG TERM GOAL #1   Title  After 6 weeks patient will demonstrate 5xSTS hands free in <17 seconds.     Baseline  6/4: 17 seconds 6/18: 15 seconds    Time  4    Period  Weeks    Status  Achieved      PT LONG TERM GOAL #2   Title  After 6 weeks patient will improve MMT by 1/2 grade in Right knee extension, ankle dorsiflexion, and hip abduction.     Baseline  4/5; 6/18: 4+/5    Time  6    Period  Weeks    Status  Achieved      PT LONG TERM GOAL #3   Title  After 6 weeks patient will demonstrate improved TUG <10sec to demnstrate decreased falls risk.     Baseline  6/4: 10.5 seconds  6/18: 9 seconds     Time  6    Period  Weeks    Status  Achieved      PT LONG TERM GOAL #4   Title  After 6 weeks pt will demonstrate 6MWT with average gait speed >1.68ms to improve ability to access th ecommunity ad  lib.     Baseline  1380 ft which is >1.0 m/s     Status  Achieved      PT LONG TERM GOAL #5   Title  Patient will demonstrate 5xSTS hands free in <15 seconds to improve mobility    Baseline  6/4: 15 seconds; 6/18: 14 seconds    Time  6    Period  Weeks    Status  Achieved      PT LONG TERM GOAL #6   Title  Patient will improve DGI score to >21 to demonstrate improved stability with ambulation.     Baseline  6/4: 17 6/18: 21/24    Time  6    Period  Weeks    Status  Achieved            Plan - 01/18/18 1344    Clinical Impression Statement  Patient met all goals at this time. Improved gait speed, transfers, and ambulatory dynamic balance improving. Patient is independent in HEP.  Patient appropriate for discharge at this time. I will be happy to see patient again in the future as needed.    Rehab Potential  Good    PT Frequency  2x / week    PT Duration  6 weeks    PT Treatment/Interventions  ADLs/Self Care Home Management;Electrical Stimulation;Gait training;Moist Heat;Stair training;Functional mobility training;Therapeutic activities;Therapeutic exercise;Balance training;Neuromuscular re-education;Patient/family education;Cognitive remediation;Passive range of motion    PT Next Visit Plan  single leg stability, walk across unstable surfaces, stairs.      PT Home Exercise Plan  none established this visit, difficulty with printer     Consulted and Agree with Plan of Care  Patient       Patient will benefit from skilled therapeutic intervention in order to improve the following deficits and impairments:  Abnormal gait, Difficulty walking, Cardiopulmonary status limiting activity, Dizziness, Decreased endurance, Decreased activity tolerance, Decreased knowledge of precautions, Decreased balance, Decreased cognition, Decreased mobility, Decreased strength, Postural dysfunction  Visit Diagnosis: Unsteadiness on feet  Muscle weakness (generalized)     Problem List Patient  Active Problem List   Diagnosis Date Noted  . Orthostatic hypotension 11/04/2017  . Hypothyroidism 11/04/2017  . OSA on CPAP 06/21/2017  . Persistent atrial fibrillation ([place] 06/21/2017  . Cerebrovascular accident (CVA) due to embolism of left middle cerebral artery ([place] 03/04/2017  . Osteoporosis 12/26/2015  . ANXIETY DEPRESSION 04/10/2010   MJanna Arch PT, DPT   01/18/2018, 1:45 PM  CCarle PlaceMAIN RMount [facility] Medical CenterSERVICES [address]RLaguna [address] [place] [address]Phone: [phone]  Fax:  [phone] Name: Monica LEMLERMRN: [id number] of Birth: [birth year]

## 2018-01-20 ENCOUNTER — Ambulatory Visit: Payer: Medicare Other

## 2018-02-07 ENCOUNTER — Ambulatory Visit: Payer: Self-pay

## 2018-02-17 ENCOUNTER — Other Ambulatory Visit: Payer: Self-pay

## 2018-02-17 ENCOUNTER — Other Ambulatory Visit: Payer: Self-pay | Admitting: Internal Medicine

## 2018-02-17 MED ORDER — DILTIAZEM HCL ER COATED BEADS 240 MG PO CP24
240.0000 mg | ORAL_CAPSULE | Freq: Every day | ORAL | 1 refills | Status: DC
Start: 2018-02-17 — End: 2018-04-06

## 2018-02-17 NOTE — Telephone Encounter (Signed)
Copied from [place] [id number]. Topic: Quick Communication - Rx Refill/Question >> Feb 17, 2018  1:10 PM Oliver Pila B wrote: Medication: levothyroxine (SYNTHROID, LEVOTHROID) 25 MCG tablet [[id number]]   Has the patient contacted their pharmacy? Yes.   (Agent: If no, request that the patient contact the pharmacy for the refill.) (Agent: If yes, when and what did the pharmacy advise?)  Preferred Pharmacy (with phone number or street name): [facility]  Agent: Please be advised that RX refills may take up to 3 business days. We ask that you follow-up with your pharmacy.

## 2018-02-17 NOTE — Telephone Encounter (Signed)
Levothyroxine refill Last Refill:12/18/16 #90 with 3 refills. Last filled by Dr. Deborra Medina Last OV: 11/29/17 Next OV: 05/17/18 PCP: Jacquelynn Cree Pharmacy:[facility]

## 2018-02-18 MED ORDER — LEVOTHYROXINE SODIUM 25 MCG PO TABS: 25.0000 ug | ORAL_TABLET | Freq: Every day | ORAL | 0 refills | Status: DC

## 2018-02-23 DIAGNOSIS — H40052 Ocular hypertension, left eye: Secondary | ICD-10-CM | POA: Diagnosis not present

## 2018-03-10 ENCOUNTER — Ambulatory Visit: Payer: Medicare Other | Admitting: Family Medicine

## 2018-04-06 ENCOUNTER — Other Ambulatory Visit (HOSPITAL_COMMUNITY): Payer: Self-pay | Admitting: *Deleted

## 2018-04-06 ENCOUNTER — Other Ambulatory Visit: Payer: Self-pay | Admitting: Internal Medicine

## 2018-04-06 MED ORDER — DILTIAZEM HCL ER COATED BEADS 240 MG PO CP24: 240.0000 mg | ORAL_CAPSULE | Freq: Every day | ORAL | 1 refills | Status: DC

## 2018-04-06 NOTE — Telephone Encounter (Signed)
Eliquis 5mg  refill request received; pt is 72 yrs old, wt-77.1kg, Crea-0.73 on 08/20/17, last seen by Dr. Rayann Heman on 08/20/17; will send in refill to requested pharmacy.

## 2018-04-11 ENCOUNTER — Telehealth: Payer: Self-pay | Admitting: *Deleted

## 2018-04-11 NOTE — Telephone Encounter (Signed)
Information has been submitted to pts insurance for verification of benefits. Awaiting response for coverage  

## 2018-05-03 ENCOUNTER — Telehealth: Payer: Self-pay

## 2018-05-03 NOTE — Telephone Encounter (Signed)
PLEASE NOTE: All timestamps contained within this report are represented as [place] Standard Time. CONFIDENTIALTY NOTICE: This fax transmission is intended only for the addressee. It contains information that is legally privileged, confidential or otherwise protected from use or disclosure. If you are not the intended recipient, you are strictly prohibited from reviewing, disclosing, copying using or disseminating any of this information or taking any action in reliance on or regarding this information. If you have received this fax in error, please notify us immediately by telephone so that we can arrange for its return to [place]. Phone: [phone], Toll-Free: [phone], Fax: [phone] Page: 1 of 1 Call Id: [id number] [place] Night - Client Nonclinical Telephone Record [place] Night - Client Client Site [place] Physician Webb Silversmith - NP Contact Type Call Who Is Calling Patient / Member / Family / Caregiver Caller Name [place] Phone Number [phone] Call Type Message Only Information Provided Reason for Call Returning a Call from the Office Initial [place] states returning call to [facility] in the office; Additional Comment Please call home number first. Caller declined triage. Call Closed By: Jerrye Beavers Transaction Date/Time: 05/03/2018 10:21:33 AM (ET)

## 2018-05-03 NOTE — Telephone Encounter (Signed)
Verification of benefits have been processed and an approval has been received for pts prolia injection. Pts estimated cost are appx $235. This is only an estimate and cannot be confirmed until benefits are paid. Please advise pt and schedule if needed. If scheduled, once the injection is received, pls contact me back with the date it was received so that I am able to update prolia folder. thanks   Lm on pts vm requesting a call back

## 2018-05-03 NOTE — Telephone Encounter (Signed)
Spoke to pt and advised. Ca lab and prolia inj scheduled.

## 2018-05-05 ENCOUNTER — Ambulatory Visit (HOSPITAL_COMMUNITY): Payer: Medicare Other | Admitting: Nurse Practitioner

## 2018-05-10 ENCOUNTER — Ambulatory Visit: Payer: Medicare Other | Admitting: Internal Medicine

## 2018-05-10 ENCOUNTER — Encounter: Payer: Self-pay | Admitting: Internal Medicine

## 2018-05-10 VITALS — BP 108/66 | HR 71 | Temp 98.2°F | Wt 167.0 lb

## 2018-05-10 DIAGNOSIS — R630 Anorexia: Secondary | ICD-10-CM

## 2018-05-10 DIAGNOSIS — E039 Hypothyroidism, unspecified: Secondary | ICD-10-CM | POA: Diagnosis not present

## 2018-05-10 DIAGNOSIS — F419 Anxiety disorder, unspecified: Secondary | ICD-10-CM | POA: Diagnosis not present

## 2018-05-10 DIAGNOSIS — M810 Age-related osteoporosis without current pathological fracture: Secondary | ICD-10-CM | POA: Diagnosis not present

## 2018-05-10 DIAGNOSIS — F4321 Adjustment disorder with depressed mood: Secondary | ICD-10-CM | POA: Diagnosis not present

## 2018-05-10 DIAGNOSIS — Z136 Encounter for screening for cardiovascular disorders: Secondary | ICD-10-CM | POA: Diagnosis not present

## 2018-05-10 DIAGNOSIS — R42 Dizziness and giddiness: Secondary | ICD-10-CM

## 2018-05-10 DIAGNOSIS — F329 Major depressive disorder, single episode, unspecified: Secondary | ICD-10-CM

## 2018-05-10 DIAGNOSIS — Z23 Encounter for immunization: Secondary | ICD-10-CM

## 2018-05-10 LAB — COMPREHENSIVE METABOLIC PANEL
ALK PHOS: 44 U/L (ref 39–117)
ALT: 11 U/L (ref 0–35)
AST: 15 U/L (ref 0–37)
Albumin: 4.1 g/dL (ref 3.5–5.2)
BUN: 24 mg/dL — ABNORMAL HIGH (ref 6–23)
CALCIUM: 9.4 mg/dL (ref 8.4–10.5)
CHLORIDE: 98 meq/L (ref 96–112)
CO2: 30 mEq/L (ref 19–32)
Creatinine, Ser: 0.69 mg/dL (ref 0.40–1.20)
GFR: 89.2 mL/min (ref >60.00)
Glucose, Bld: 83 mg/dL (ref 70–99)
POTASSIUM: 4.2 meq/L (ref 3.5–5.1)
Sodium: 134 mEq/L — ABNORMAL LOW (ref 135–145)
TOTAL PROTEIN: 7 g/dL (ref 6.0–8.3)
Total Bilirubin: 0.5 mg/dL (ref 0.2–1.2)

## 2018-05-10 LAB — LIPID PANEL
Cholesterol: 219 mg/dL — ABNORMAL HIGH (ref 0–200)
HDL: 97.5 mg/dL (ref >39.00)
LDL Cholesterol: 110 mg/dL — ABNORMAL HIGH (ref 0–99)
NonHDL: 121.83
Total CHOL/HDL Ratio: 2
Triglycerides: 57 mg/dL (ref 0.0–149.0)
VLDL: 11.4 mg/dL (ref 0.0–40.0)

## 2018-05-10 LAB — TSH: TSH: 1.56 u[IU]/mL (ref 0.35–4.50)

## 2018-05-10 LAB — CBC
HEMATOCRIT: 41.7 % (ref 36.0–46.0)
HEMOGLOBIN: 13.8 g/dL (ref 12.0–15.0)
MCHC: 33.1 g/dL (ref 30.0–36.0)
MCV: 92.3 fl (ref 78.0–100.0)
Platelets: 300 10*3/uL (ref 150.0–400.0)
RBC: 4.52 Mil/uL (ref 3.87–5.11)
RDW: 14.3 % (ref 11.5–15.5)
WBC: 5.2 10*3/uL (ref 4.0–10.5)

## 2018-05-10 LAB — VITAMIN D 25 HYDROXY (VIT D DEFICIENCY, FRACTURES): VITD: 40.59 ng/mL (ref 30.00–100.00)

## 2018-05-10 LAB — T4, FREE: FREE T4: 0.88 ng/dL (ref 0.60–1.60)

## 2018-05-10 NOTE — Patient Instructions (Signed)

## 2018-05-10 NOTE — Progress Notes (Signed)
Subjective:    Patient ID: Monica Whitaker, female    DOB: November 18, 1945, 72 y.o.   MRN: [id number]  HPI  Pt presents to the clinic today with c/o lightheadedness, fatigue and poor appetite. She reports she has been feeling this way since her husband passed away 1 month ago. She is sleeping about 10 hours per night with some interupption. She is not eating much at all. She has lost 3 lbs in the last month. She feels like she is not taking care of herself at al. She has history of anxiety and depression, controlled on Cymbalta and Abilify. She does follow with psychiatry. She is currently seeing a [facility] She does not feel like she has a strong support group in the area. Her 2 closest friends, 1 has Alzheimer's, the other has severe MS. Most of her family is up [place] and she has not heard anything from any of her husbands family since the funeral. She denies SI/HI.  Review of Systems      Past Medical History:  Diagnosis Date  . Anxiety    controlled with meds  . Atrial fibrillation ([name])    paroxysmal, failed medical therapy with flecainide,  NSVT with tikosyn  . Bruises easily   . CVA (cerebral vascular accident) ([place]) 12/2007  . Depression    medically controlled   . Low blood pressure    no falls, but if gets up to fast  . Stroke ([place]) 7 years ago   Lasting effects on balance, different personality.  . Thyroid disease   . 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 acetaminophen (TYLENOL) 500 MG tablet Take 2 tablets by mouth every 6 hours as needed for pain    . ARIPiprazole (ABILIFY) 2 MG tablet Take 4 mg by mouth daily.  0  . calcium carbonate (OS-CAL) 600 MG TABS Take 600 mg by mouth daily with breakfast.     . cholecalciferol (VITAMIN D) 1000 UNITS tablet Take 2,000 Units by mouth daily.     Marland Kitchen denosumab (PROLIA) 60 MG/ML SOLN injection Inject  into the skin.    Marland Kitchen dextroamphetamine (DEXTROSTAT) 5 MG tablet Take 5 mg by mouth daily.     Marland Kitchen diltiazem (CARDIZEM CD) 240 MG 24 hr capsule Take 1 capsule (240 mg total) by mouth daily. May take extra capsule daily as needed for afib 95 capsule 1  . diltiazem (CARDIZEM) 30 MG tablet Ttake 1 tablet every 4 hours AS NEEDED for rapid afib heart rate over 100 45 tablet 1  . DULoxetine (CYMBALTA) 30 MG capsule Take 30 mg by mouth as directed. TAKE 1 CAPSULES EVERY MORNING AND 1 CAPSULE AT NIGHT    . ELIQUIS 5 MG TABS tablet TAKE ONE TABLET BY MOUTH TWICE DAILY 180 tablet 1  . flecainide (TAMBOCOR) 100 MG tablet Take 1 tablet (100 mg total) by mouth 2 (two) times daily. 180 tablet 2  . furosemide (LASIX) 20 MG tablet Take 0.5 tablets (10 mg total) by mouth as needed for edema. 10 tablet 3  . levothyroxine (SYNTHROID, LEVOTHROID) 25 MCG tablet Take 1 tablet (25 mcg total) by mouth daily. 90 tablet 0  . Multiple Vitamin (MULTIVITAMIN) tablet Take 1 tablet by mouth daily.      . Oral Electrolytes (BUFFERED SALT PO) Take 1 capsule by mouth daily as needed (Electrolyte/Salt supplement).     Marland Kitchen  Timolol Maleate (TIMOPTIC OP) Place 1 drop into the left eye daily.     . vitamin B-12 (CYANOCOBALAMIN) 1000 MCG tablet Take 1,000 mcg by mouth daily.    . vitamin C (ASCORBIC ACID) 500 MG tablet Take 500 mg by mouth daily.    . vitamin E 400 UNIT capsule Take 400 Units by mouth daily.       No current facility-administered medications for this visit.     Allergies  Allergen Reactions  . Darvon Other (See Comments)    Severe panic attacks  . Propoxyphene Other (See Comments)    GI Upset Severe panic attacks GI Upset  . Propoxyphene N-Acetaminophen Other (See Comments)    Severe panic attacks  . Levofloxacin Anxiety and Other (See Comments)    Causes panic attacks.    Family History  Problem Relation Age of Onset  . Multiple sclerosis Father   . Breast cancer Neg Hx     Social History    Socioeconomic History  . Marital status: Widowed    Spouse name: Not on file  . Number of children: 0  . Years of education: Not on file  . Highest education level: Not on file  Occupational History  . Occupation: Retired    Fish farm manager: RETIRED  Social Needs  . Financial resource strain: Not on file  . Food insecurity:    Worry: Not on file    Inability: Not on file  . Transportation needs:    Medical: Not on file    Non-medical: Not on file  Tobacco Use  . Smoking status: Never Smoker  . Smokeless tobacco: Never Used  Substance and Sexual Activity  . Alcohol use: Yes    Alcohol/week: 1.0 standard drinks    Types: 1 Standard drinks or equivalent per week    Comment: regular  . Drug use: No  . Sexual activity: Never  Lifestyle  . Physical activity:    Days per week: Not on file    Minutes per session: Not on file  . Stress: Not on file  Relationships  . Social connections:    Talks on phone: Not on file    Gets together: Not on file    Attends religious service: Not on file    Active member of club or organization: Not on file    Attends meetings of clubs or organizations: Not on file    Relationship status: Not on file  . Intimate partner violence:    Fear of current or ex partner: Not on file    Emotionally abused: Not on file    Physically abused: Not on file    Forced sexual activity: Not on file  Other Topics Concern  . Not on file  Social History Narrative   Lives in [place] with her husband.  No children.  Former Psychologist, prison and probation services   Enjoys exercise- water classes, yoga Editor, commissioning.       Has a living will-    Would desire CPR   Would not want prolonged life support if futile.     Constitutional: Pt reports fatigue. Denies fever, malaise, headache or abrupt weight changes.  Respiratory: Denies difficulty breathing, shortness of breath, cough or sputum production.   Cardiovascular: Denies chest pain, chest tightness, palpitations or swelling in  the hands or feet.  Gastrointestinal: Pt reports poor appetite. Denies abdominal pain, bloating, constipation, diarrhea or blood in the stool.  Musculoskeletal: Pt reports muscle tensions. Denies decrease in range of motion, difficulty with gait,  or joint pain and swelling.   Neurological: Pt reports lightheadedness. Denies difficulty with memory, difficulty with speech or problems with balance and coordination.  Psych: Pt reports grief, anxiety and depression. Denies SI/HI.  No other specific complaints in a complete review of systems (except as listed in HPI above).  Objective:   Physical Exam  BP 108/66   Pulse 71   Temp 98.2 F (36.8 C) (Oral)   Wt 167 lb (75.8 kg)   SpO2 97%   BMI 23.96 kg/m  Wt Readings from Last 3 Encounters:  05/10/18 167 lb (75.8 kg)  11/29/17 170 lb (77.1 kg)  11/04/17 178 lb (80.7 kg)    General: Appears her stated age, pale but in NAD. Cardiovascular: Normal rate and rhythm. S1,S2 noted.  No murmur, rubs or gallops noted. Pulmonary/Chest: Normal effort and positive vesicular breath sounds. No respiratory distress. No wheezes, rales or ronchi noted.  Abdomen: Soft and nontender. Normal bowel sounds. No distention or masses noted. Neurological: Alert and oriented.  Psychiatric: Tearful.   BMET    Component Value Date/Time   NA 139 08/20/2017 1619   NA 136 11/07/2014 1539   K 4.6 08/20/2017 1619   K 4.3 11/07/2014 1539   CL 99 08/20/2017 1619   CL 100 (L) 11/07/2014 1539   CO2 23 08/20/2017 1619   CO2 28 11/07/2014 1539   GLUCOSE 94 08/20/2017 1619   GLUCOSE 90 11/19/2016 0852   GLUCOSE 107 (H) 11/07/2014 1539   BUN 17 08/20/2017 1619   BUN 22 (H) 11/07/2014 1539   CREATININE 0.73 08/20/2017 1619   CREATININE 0.60 11/07/2014 1539   CALCIUM 9.1 11/25/2017 1257   CALCIUM 9.2 11/07/2014 1539   GFRNONAA 84 08/20/2017 1619   GFRNONAA >60 11/07/2014 1539   GFRAA 96 08/20/2017 1619   GFRAA >60 11/07/2014 1539    Lipid Panel     Component  Value Date/Time   CHOL 223 (H) 05/07/2017 1619   TRIG 50 05/07/2017 1619   HDL 111 05/07/2017 1619   CHOLHDL 2.0 05/07/2017 1619   VLDL 12.4 05/06/2016 1412   LDLCALC 99 05/07/2017 1619    CBC    Component Value Date/Time   WBC 5.2 08/20/2017 1619   WBC 4.5 11/19/2016 0852   RBC 4.30 08/20/2017 1619   RBC 4.27 11/19/2016 0852   HGB 13.0 08/20/2017 1619   HCT 38.1 08/20/2017 1619   PLT 286 08/20/2017 1619   MCV 89 08/20/2017 1619   MCV 92 11/07/2014 1539   MCH 30.2 08/20/2017 1619   MCH 30.0 11/07/2014 1539   MCH 30.3 04/13/2011 0500   MCHC 34.1 08/20/2017 1619   MCHC 33.4 11/19/2016 0852   RDW 14.1 08/20/2017 1619   RDW 13.9 11/07/2014 1539   LYMPHSABS 1.7 11/19/2016 0852   MONOABS 0.6 11/19/2016 0852   EOSABS 0.2 11/19/2016 0852   BASOSABS 0.0 11/19/2016 0852    Hgb A1C No results found for: HGBA1C          Assessment & Plan:   Grief, Anxiety and Depression, Lightheaded, Poor Appetite:  Support offered today Encouraged her to continue grief counseling through [facility]osp[facility] Encouraged her to spend time with friends and family as much as she can Advised her to supplement with Boost, if she can not make herself eat Encourage adequate fluid intake Advised her to call psychiatry to see if her appt can be moved up to discuss possible changes in medication management She declines RX for Hydroxyzine at this time Will check CBC,  CMET, TSH, Free T4, Lipid and Vit D today Flu shot today  Return precautions discussed Webb Silversmith, NP

## 2018-05-12 ENCOUNTER — Ambulatory Visit: Payer: Medicare Other

## 2018-05-12 ENCOUNTER — Ambulatory Visit (HOSPITAL_COMMUNITY): Payer: Medicare Other | Admitting: Nurse Practitioner

## 2018-05-17 ENCOUNTER — Encounter: Payer: Medicare Other | Admitting: Internal Medicine

## 2018-05-19 ENCOUNTER — Encounter (HOSPITAL_COMMUNITY): Payer: Self-pay | Admitting: Nurse Practitioner

## 2018-05-19 ENCOUNTER — Ambulatory Visit (HOSPITAL_COMMUNITY)
Admission: RE | Admit: 2018-05-19 | Discharge: 2018-05-19 | Disposition: A | Discharge status: Home / Self Care | Payer: Medicare Other | Source: Ambulatory Visit | Attending: Nurse Practitioner | Admitting: Nurse Practitioner

## 2018-05-19 VITALS — BP 96/62 | HR 62 | Ht 70.0 in | Wt 167.0 lb

## 2018-05-19 DIAGNOSIS — E079 Disorder of thyroid, unspecified: Secondary | ICD-10-CM | POA: Diagnosis not present

## 2018-05-19 DIAGNOSIS — Z90710 Acquired absence of both cervix and uterus: Secondary | ICD-10-CM | POA: Diagnosis not present

## 2018-05-19 DIAGNOSIS — Z9889 Other specified postprocedural states: Secondary | ICD-10-CM | POA: Diagnosis not present

## 2018-05-19 DIAGNOSIS — F419 Anxiety disorder, unspecified: Secondary | ICD-10-CM | POA: Diagnosis not present

## 2018-05-19 DIAGNOSIS — I480 Paroxysmal atrial fibrillation: Secondary | ICD-10-CM | POA: Diagnosis not present

## 2018-05-19 DIAGNOSIS — Z79899 Other long term (current) drug therapy: Secondary | ICD-10-CM | POA: Diagnosis not present

## 2018-05-19 DIAGNOSIS — Z8673 Personal history of transient ischemic attack (TIA), and cerebral infarction without residual deficits: Secondary | ICD-10-CM | POA: Diagnosis not present

## 2018-05-19 DIAGNOSIS — Z79890 Hormone replacement therapy (postmenopausal): Secondary | ICD-10-CM | POA: Insufficient documentation

## 2018-05-19 NOTE — Progress Notes (Signed)
Patient ID: Monica Whitaker, female   DOB: [birth year], 72 y.o.   MRN: [id number]     Primary Care Physician: Monica Fenton, NP Referring Physician: Dr. Sherryll Burger is a 72 y.o. female with a h/o PAF in the afib clinic.  Unfortunately, her husband died in the last month. She fortunately was able to stay in [place] during this difficult time.  Compliant with blood thinner.Failed tikosyn in the past due to NSVT. No issue with  anticoagulation . Ablation x 1 in 2014.  Today, she denies  chest pain, shortness of breath, orthopnea, PND, lower extremity edema, dizziness, presyncope, syncope, or neurologic sequela. Postive for intermittent irregular heart beat. The patient is tolerating medications without difficulties and is otherwise without complaint today.   Past Medical History:  Diagnosis Date  . Anxiety    controlled with meds  . Atrial fibrillation ([name])    paroxysmal, failed medical therapy with flecainide,  NSVT with tikosyn  . Bruises easily   . CVA (cerebral vascular accident) ([place]) 12/2007  . Depression    medically controlled   . Low blood pressure    no falls, but if gets up to fast  . Stroke ([place]) 7 years ago   Lasting effects on balance, different personality.  . Thyroid disease   . 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 ATRIAL FIBRILLATION ABLATION  10/18/12   PVI by Dr Rayann Heman  . ATRIAL FIBRILLATION ABLATION N/A 10/18/2012   Procedure: ATRIAL FIBRILLATION ABLATION;  Surgeon: Thompson Grayer, MD;  Location: [facility] CATH LAB;  Service: Cardiovascular;  Laterality: N/A;  . EYE SURGERY     on L/R eye, macular hole   . OPEN REDUCTION INTERNAL FIXATION (ORIF) DISTAL RADIAL FRACTURE Right 11/15/2014   Procedure: OPEN REDUCTION INTERNAL FIXATION (ORIF) RIGHT DISTAL RADIAL FRACTURE WITH ALLOGRAFT BONE GRAFT;  Surgeon: Roseanne Kaufman, MD;  Location: [facility];  Service:  Orthopedics;  Laterality: Right;  . TEE WITHOUT CARDIOVERSION N/A 10/18/2012   Procedure: TRANSESOPHAGEAL ECHOCARDIOGRAM (TEE);  Surgeon: Lelon Perla, MD;  Location: [facility] ENDOSCOPY;  Service: Cardiovascular;  Laterality: N/A;  Pre-Ablation at 12pm  . TONSILLECTOMY    . VAGINAL HYSTERECTOMY      Current Outpatient Medications  Medication Sig Dispense Refill  . acetaminophen (TYLENOL) 500 MG tablet Take 2 tablets by mouth every 6 hours as needed for pain    . calcium carbonate (OS-CAL) 600 MG TABS Take 600 mg by mouth daily with breakfast.     . cholecalciferol (VITAMIN D) 1000 UNITS tablet Take 2,000 Units by mouth daily.     Marland Kitchen denosumab (PROLIA) 60 MG/ML SOLN injection Inject into the skin.    Marland Kitchen dextroamphetamine (DEXTROSTAT) 5 MG tablet Take 5 mg by mouth daily.     Marland Kitchen diltiazem (CARDIZEM CD) 240 MG 24 hr capsule Take 1 capsule (240 mg total) by mouth daily. May take extra capsule daily as needed for afib 95 capsule 1  . DULoxetine (CYMBALTA) 30 MG capsule Take 30 mg by mouth as directed. TAKE 1 CAPSULES EVERY MORNING AND 1 CAPSULE AT NIGHT    . ELIQUIS 5 MG TABS tablet TAKE ONE TABLET BY MOUTH TWICE DAILY 180 tablet 1  . flecainide (TAMBOCOR) 100 MG tablet Take 1 tablet (100 mg total) by mouth 2 (two) times daily. 180 tablet 2  . levothyroxine (SYNTHROID, LEVOTHROID) 25 MCG tablet Take  1 tablet (25 mcg total) by mouth daily. 90 tablet 0  . Multiple Vitamin (MULTIVITAMIN) tablet Take 1 tablet by mouth daily.      . Timolol Maleate (TIMOPTIC OP) Place 1 drop into the left eye daily.     . vitamin B-12 (CYANOCOBALAMIN) 1000 MCG tablet Take 1,000 mcg by mouth daily.    . vitamin C (ASCORBIC ACID) 500 MG tablet Take 500 mg by mouth daily.    . vitamin E 400 UNIT capsule Take 400 Units by mouth daily.      Marland Kitchen diltiazem (CARDIZEM) 30 MG tablet Ttake 1 tablet every 4 hours AS NEEDED for rapid afib heart rate over 100 (Patient not taking: Reported on 05/19/2018) 45 tablet 1  . furosemide (LASIX) 20  MG tablet Take 0.5 tablets (10 mg total) by mouth as needed for edema. (Patient not taking: Reported on 05/19/2018) 10 tablet 3  . Oral Electrolytes (BUFFERED SALT PO) Take 1 capsule by mouth daily as needed (Electrolyte/Salt supplement).      No current facility-administered medications for this encounter.     Allergies  Allergen Reactions  . Darvon Other (See Comments)    Severe panic attacks  . Propoxyphene Other (See Comments)    GI Upset Severe panic attacks GI Upset  . Propoxyphene N-Acetaminophen Other (See Comments)    Severe panic attacks  . Levofloxacin Anxiety and Other (See Comments)    Causes panic attacks.    Social History   Socioeconomic History  . Marital status: Widowed    Spouse name: Not on file  . Number of children: 0  . Years of education: Not on file  . Highest education level: Not on file  Occupational History  . Occupation: Retired    Fish farm manager: RETIRED  Social Needs  . Financial resource strain: Not on file  . Food insecurity:    Worry: Not on file    Inability: Not on file  . Transportation needs:    Medical: Not on file    Non-medical: Not on file  Tobacco Use  . Smoking status: Never Smoker  . Smokeless tobacco: Never Used  Substance and Sexual Activity  . Alcohol use: Yes    Alcohol/week: 1.0 standard drinks    Types: 1 Standard drinks or equivalent per week    Comment: regular  . Drug use: No  . Sexual activity: Never  Lifestyle  . Physical activity:    Days per week: Not on file    Minutes per session: Not on file  . Stress: Not on file  Relationships  . Social connections:    Talks on phone: Not on file    Gets together: Not on file    Attends religious service: Not on file    Active member of club or organization: Not on file    Attends meetings of clubs or organizations: Not on file    Relationship status: Not on file  . Intimate partner violence:    Fear of current or ex partner: Not on file    Emotionally abused: Not  on file    Physically abused: Not on file    Forced sexual activity: Not on file  Other Topics Concern  . Not on file  Social History Narrative   Lives in [place] with her husband.  No children.  Former Psychologist, prison and probation services   Enjoys exercise- water classes, yoga Editor, commissioning.       Has a living will-    Would desire CPR  Would not want prolonged life support if futile.    Family History  Problem Relation Age of Onset  . Multiple sclerosis Father   . Breast cancer Neg Hx     ROS- All systems are reviewed and negative except as per the HPI above  Physical Exam: Vitals:   05/19/18 1429  BP: 96/62  Pulse: 62  Weight: 75.8 kg  Height: 5\' 10"  (1.778 m)    GEN- The patient is well appearing, alert and oriented x 3 today.   Head- normocephalic, atraumatic Eyes-  Sclera clear, conjunctiva pink Ears- hearing intact Oropharynx- clear Neck- supple, no JVP Lymph- no cervical lymphadenopathy Lungs- Clear to ausculation bilaterally, normal work of breathing Heart- Regular rate and rhythm, no murmurs, rubs or gallops, PMI not laterally displaced GI- soft, NT, ND, + BS Extremities- no clubbing, cyanosis, or edema MS- no significant deformity or atrophy Skin- no rash or lesion Psych- euthymic mood, full affect Neuro- strength and sensation are intact  EKG-NSR at 62 bpm, Pr int 160 ms, QRS int 86 ms, QTc 570 ms  QTC reviewed with Dr. Rayann Heman and  Epic records reviewed  Assessment and Plan: 1. PAF Has done  very well on flecainide recently without any noticeable afib burden but qtc is prolonged Reviewed with Dr, Rayann Heman and recommended to reduce  Flecainide to 50 mg bid Recent labs done with pcp and creatinine stable at 0.69 with crcl cal at 90.72, K+ at 4.2 Continue with diltiazem Continue xarelto 20 mg daily  2.H/o hypotension Stable Uses salt tablets as needed  F/u with EKG next week F/u 11/30 with Dr. Rayann Heman in 6 months   Geroge Baseman. Isidore Margraf, [facility] [address] [place], [place] [address] [phone]

## 2018-05-20 ENCOUNTER — Other Ambulatory Visit (HOSPITAL_COMMUNITY): Payer: Self-pay | Admitting: *Deleted

## 2018-05-20 MED ORDER — FLECAINIDE ACETATE 100 MG PO TABS: 50.0000 mg | ORAL_TABLET | Freq: Two times a day (BID) | ORAL | 2 refills | Status: DC

## 2018-05-25 ENCOUNTER — Ambulatory Visit (INDEPENDENT_AMBULATORY_CARE_PROVIDER_SITE_OTHER): Payer: Medicare Other | Admitting: Urology

## 2018-05-25 ENCOUNTER — Encounter: Payer: Self-pay | Admitting: Urology

## 2018-05-25 VITALS — BP 103/68 | HR 80 | Ht 69.0 in | Wt 169.2 lb

## 2018-05-25 DIAGNOSIS — N952 Postmenopausal atrophic vaginitis: Secondary | ICD-10-CM

## 2018-05-25 DIAGNOSIS — N3946 Mixed incontinence: Secondary | ICD-10-CM

## 2018-05-25 DIAGNOSIS — R351 Nocturia: Secondary | ICD-10-CM | POA: Diagnosis not present

## 2018-05-25 LAB — BLADDER SCAN AMB NON-IMAGING: Scan Result: 0

## 2018-05-25 MED ORDER — ESTRADIOL 0.1 MG/GM VA CREA: TOPICAL_CREAM | VAGINAL | 12 refills | Status: DC

## 2018-05-25 MED ORDER — ESTROGENS, CONJUGATED 0.625 MG/GM VA CREA: TOPICAL_CREAM | VAGINAL | 12 refills | Status: DC

## 2018-05-25 NOTE — Patient Instructions (Signed)
I have given you two prescriptions for a vaginal estrogen cream.  Estrace and Premarin.  Please take these to your pharmacy and see which one your insurance covers.  If both are too expensive, please call the office at [phone] for an alternative.  You are given a sample of vaginal estrogen cream Premarin and instructed to apply 0.5mg (pea-sized amount)  just inside the vaginal introitus with a finger-tip on Monday, Wednesday and Friday nights,     

## 2018-05-25 NOTE — Progress Notes (Signed)
05/25/2018 2:33 PM   Monica Whitaker 1945/09/25 [id number]  Referring provider: Jearld Fenton, NP [address] [place], [place] [address]  Chief Complaint  Patient presents with  . Urinary Incontinence    New Patient  . Urinary Frequency    HPI: Patient is a 72 -year-old Caucasian female who is referred to [place] by Jearld Fenton, NP for nocturia.    She has been having nocturia every hour over the two months.  She has been able to fall right back asleep.  Her husband recently passed away, so she is now able to seek care for herself.    She is wearing two thin pads daily for urge incontinence.  No SUI.    She is having associated urinary frequency, urgency, nocturia and weak urinary stream.   Patient denies any gross hematuria, dysuria or suprapubic/flank pain.  Patient denies any fevers, chills, nausea or vomiting.   Her PVR is 0 mL.  UA 6-10 WBC's.  Many bacteria.  Nitrite positive.    She states that the urine is thicker than the water, as it settles in the toilet.    She does not have a history of nephrolithiasis, GU surgery or GU trauma.   She is post menopausal.   She denies constipation and/or diarrhea.   She is drinking 5 to 6 bottles of water daily.   She is drinking one cup of coffee in the am.  She does not drink tea.  She has diet soda twice weekly.  She drinks orange juice.  She drinks one glass of wine daily.    PMH: Past Medical History:  Diagnosis Date  . Anxiety    controlled with meds  . Anxiety   . Arrhythmia   . Atrial fibrillation ([name])    paroxysmal, failed medical therapy with flecainide,  NSVT with tikosyn  . Bruises easily   . CVA (cerebral vascular accident) ([place]) 12/2007  . Depression    medically controlled   . Low blood pressure    no falls, but if gets up to fast  . Stroke ([place]) 7 years ago   Lasting effects on balance, different personality.  . Thyroid disease   . Ventricular tachycardia ([place])    pt told at [facility] that she  had RVOT VT    Surgical History: Past Surgical History:  Procedure Laterality Date  . ATRIAL FIBRILLATION ABLATION  10/18/12   PVI by Dr Rayann Heman  . ATRIAL FIBRILLATION ABLATION N/A 10/18/2012   Procedure: ATRIAL FIBRILLATION ABLATION;  Surgeon: Thompson Grayer, MD;  Location: [facility] CATH LAB;  Service: Cardiovascular;  Laterality: N/A;  . EYE SURGERY     on L/R eye, macular hole   . OPEN REDUCTION INTERNAL FIXATION (ORIF) DISTAL RADIAL FRACTURE Right 11/15/2014   Procedure: OPEN REDUCTION INTERNAL FIXATION (ORIF) RIGHT DISTAL RADIAL FRACTURE WITH ALLOGRAFT BONE GRAFT;  Surgeon: Roseanne Kaufman, MD;  Location: [facility];  Service: Orthopedics;  Laterality: Right;  . TEE WITHOUT CARDIOVERSION N/A 10/18/2012   Procedure: TRANSESOPHAGEAL ECHOCARDIOGRAM (TEE);  Surgeon: Lelon Perla, MD;  Location: [facility] ENDOSCOPY;  Service: Cardiovascular;  Laterality: N/A;  Pre-Ablation at 12pm  . TONSILLECTOMY    . VAGINAL HYSTERECTOMY      Home Medications:  Allergies as of 05/25/2018      Reactions   Darvon Other (See Comments)   Severe panic attacks   Propoxyphene Other (See Comments)   GI Upset Severe panic attacks GI Upset   Propoxyphene N-acetaminophen  Other (See Comments)   Severe panic attacks   Levofloxacin Anxiety, Other (See Comments)   Causes panic attacks.      Medication List        Accurate as of 05/25/18  2:33 PM. Always use your most recent med list.          acetaminophen 500 MG tablet Commonly known as:  TYLENOL Take 2 tablets by mouth every 6 hours as needed for pain   BUFFERED SALT PO Take 1 capsule by mouth daily as needed (Electrolyte/Salt supplement).   calcium carbonate 600 MG Tabs tablet Commonly known as:  OS-CAL Take 600 mg by mouth daily with breakfast.   cholecalciferol 1000 units tablet Commonly known as:  VITAMIN D Take 2,000 Units by mouth daily.   conjugated estrogens vaginal cream Commonly known as:  PREMARIN Apply 0.5mg  (pea-sized  amount)  just inside the vaginal introitus with a finger-tip on  Monday, Wednesday and Friday nights.   dextroamphetamine 5 MG tablet Commonly known as:  DEXTROSTAT Take 5 mg by mouth daily.   diltiazem 240 MG 24 hr capsule Commonly known as:  CARDIZEM CD Take 1 capsule (240 mg total) by mouth daily. May take extra capsule daily as needed for afib   diltiazem 30 MG tablet Commonly known as:  CARDIZEM Ttake 1 tablet every 4 hours AS NEEDED for rapid afib heart rate over 100   DULoxetine 30 MG capsule Commonly known as:  CYMBALTA Take 30 mg by mouth as directed. TAKE 1 CAPSULES EVERY MORNING AND 1 CAPSULE AT NIGHT   ELIQUIS 5 MG Tabs tablet Generic drug:  apixaban TAKE ONE TABLET BY MOUTH TWICE DAILY   estradiol 0.1 MG/GM vaginal cream Commonly known as:  ESTRACE Apply 0.5mg  (pea-sized amount)  just inside the vaginal introitus with a finger-tip on Monday, Wednesday and Friday nights.   flecainide 100 MG tablet Commonly known as:  TAMBOCOR Take 0.5 tablets (50 mg total) by mouth 2 (two) times daily.   furosemide 20 MG tablet Commonly known as:  LASIX Take 0.5 tablets (10 mg total) by mouth as needed for edema.   levothyroxine 25 MCG tablet Commonly known as:  SYNTHROID, LEVOTHROID Take 1 tablet (25 mcg total) by mouth daily.   multivitamin tablet Take 1 tablet by mouth daily.   PROLIA 60 MG/ML Soln injection Generic drug:  denosumab Inject into the skin.   TIMOPTIC OP Place 1 drop into the left eye daily.   vitamin B-12 1000 MCG tablet Commonly known as:  CYANOCOBALAMIN Take 1,000 mcg by mouth daily.   vitamin C 500 MG tablet Commonly known as:  ASCORBIC ACID Take 500 mg by mouth daily.   vitamin E 400 UNIT capsule Take 400 Units by mouth daily.       Allergies:  Allergies  Allergen Reactions  . Darvon Other (See Comments)    Severe panic attacks  . Propoxyphene Other (See Comments)    GI Upset Severe panic attacks GI Upset  . Propoxyphene  N-Acetaminophen Other (See Comments)    Severe panic attacks  . Levofloxacin Anxiety and Other (See Comments)    Causes panic attacks.    Family History: Family History  Problem Relation Age of Onset  . Multiple sclerosis Father   . Breast cancer Neg Hx     Social History:  reports that she has never smoked. She has never used smokeless tobacco. She reports that she drinks about 1.0 standard drinks of alcohol per week. She reports that she does  not use drugs.  ROS: UROLOGY Frequent Urination?: Yes Hard to postpone urination?: Yes Burning/pain with urination?: Yes Get up at night to urinate?: Yes Leakage of urine?: Yes Urine stream starts and stops?: No Trouble starting stream?: No Do you have to strain to urinate?: No Blood in urine?: No Urinary tract infection?: Yes Sexually transmitted disease?: No Injury to kidneys or bladder?: No Painful intercourse?: No Weak stream?: Yes Currently pregnant?: No Vaginal bleeding?: No Last menstrual period?: n  Gastrointestinal Nausea?: No Vomiting?: No Indigestion/heartburn?: No Diarrhea?: No Constipation?: No  Constitutional Fever: No Night sweats?: No Weight loss?: No Fatigue?: Yes  Skin Skin rash/lesions?: No Itching?: No  Eyes Blurred vision?: No Double vision?: No  Ears/Nose/Throat Sore throat?: No Sinus problems?: No  Hematologic/Lymphatic Swollen glands?: No Easy bruising?: No  Cardiovascular Leg swelling?: Yes Chest pain?: No  Respiratory Cough?: No Shortness of breath?: No  Endocrine Excessive thirst?: No  Musculoskeletal Back pain?: No Joint pain?: No  Neurological Headaches?: No Dizziness?: Yes  Psychologic Depression?: Yes Anxiety?: Yes  Physical Exam: BP 103/68 (BP Location: Left Arm, Patient Position: Sitting, Cuff Size: Normal)   Pulse 80   Ht 5\' 9"  (1.753 m)   Wt 169 lb 3.2 oz (76.7 kg)   BMI 24.99 kg/m   Constitutional: Well nourished. Alert and oriented, No acute  distress. HEENT: [place] AT, moist mucus membranes. Trachea midline, no masses. Cardiovascular: No clubbing, cyanosis, or edema. Respiratory: Normal respiratory effort, no increased work of breathing. GI: Abdomen is soft, non tender, non distended, no abdominal masses. Liver and spleen not palpable.  No hernias appreciated.  Stool sample for occult testing is not indicated.   GU: No CVA tenderness.  No bladder fullness or masses.  Atrophic external genitalia, normal pubic hair distribution, no lesions.  Normal urethral meatus, no lesions, no prolapse, no discharge.   Urethral caruncle noted.  No bladder fullness, tenderness or masses. Pale vagina mucosa, poor estrogen effect, no discharge, no lesions, fair pelvic support, grade I cystocele noted, no rectocele noted.  Cervix and uterus are surgically absent.  No adnexal/parametria masses or tenderness noted.  Anus and perineum are without rashes or lesions.    Skin: No rashes, bruises or suspicious lesions. Lymph: No cervical or inguinal adenopathy. Neurologic: Grossly intact, no focal deficits, moving all 4 extremities. Psychiatric: Normal mood and affect.  Laboratory Data: Lab Results  Component Value Date   WBC 5.2 05/10/2018   HGB 13.8 05/10/2018   HCT 41.7 05/10/2018   MCV 92.3 05/10/2018   PLT 300.0 05/10/2018    Lab Results  Component Value Date   CREATININE 0.69 05/10/2018    No results found for: PSA  No results found for: TESTOSTERONE  No results found for: HGBA1C  Lab Results  Component Value Date   TSH 1.56 05/10/2018       Component Value Date/Time   CHOL 219 (H) 05/10/2018 0934   HDL 97.50 05/10/2018 0934   CHOLHDL 2 05/10/2018 0934   VLDL 11.4 05/10/2018 0934   LDLCALC 110 (H) 05/10/2018 0934   LDLCALC 99 05/07/2017 1619    Lab Results  Component Value Date   AST 15 05/10/2018   Lab Results  Component Value Date   ALT 11 05/10/2018   No components found for: ALKALINEPHOPHATASE No components found for:  BILIRUBINTOTAL  No results found for: ESTRADIOL  Urinalysis 6-10 WBC's.  Many bacteria.  Nitrite positive.  See Epic.   I have reviewed the labs.   Pertinent Imaging: Results for  CHRISTY, EHRSAM (MRN [id number]) as of 05/25/2018 13:59  Ref. Range 05/25/2018 13:48  Scan Result Unknown 0     Assessment & Plan:    1. Nocturia  - I explained to the patient that nocturia is often multi-factorial and difficult to treat.  Sleeping disorders, heart conditions, peripheral vascular disease, diabetes, an enlarged prostate for men, an urethral stricture causing bladder outlet obstruction and/or certain medications can contribute to nocturia.  - I have suggested that the patient avoid caffeine after noon and alcohol in the evening.  He or she may also benefit from fluid restrictions after 6:00 in the evening and voiding just prior to bedtime.  - I have explained that research studies have showed that over 84% of patients with sleep apnea reported frequent nighttime urination.   With sleep apnea, oxygen decreases, carbon dioxide increases, the blood become more acidic, the heart rate drops and blood vessels in the lung constrict.  The body is then alerted that something is very wrong. The sleeper must wake enough to reopen the airway. By this time, the heart is racing and experiences a false signal of fluid overload. The heart excretes a hormone-like protein that tells the body to get rid of sodium and water, resulting in nocturia.  -  I also informed the patient that a recent study noted that decreasing sodium intake to 2.3 grams daily, if they don't have issues with hyponatremia, can also reduce the number of nightly voids  - There is also an increased incidence in sleep apnea with menopause, symptoms include night sweats, daytime sleepiness, depressed mood, and cognitive complaints like poor concentration or problems with short-term memory  Patient had been diagnosed with sleep apnea, but she could not  tolerate the CPAP Discussed trying an OAB medication, but patient does not want to have another pill to take explained the PTNS provides treatment by indirectly providing electrical stimulation to the nerves responsible for bladder and pelvic floor function - a needle electrode generates an adjustable electrical pulse that travels to the sacral plexus via the tibial nerve which is located in the ankle, among other functions, the sacral nerve plexus regulates bladder and pelvic floor function - treatment protocol requires once-a-week treatments for 12 weeks, 30 minutes per session and many patients begin to see improvements by the 6th treatment. Patients who respond to treatment may require occasional treatments (~ once every 3 weeks) to sustain improvements. PTNS is a low-risk procedure. The most common side-effects with PTNS treatment are temporary and minor, resulting from the placement of the needle electrode. They include minor bleeding, mild pain and skin inflammation and patients have seen up to an 80% success rate with this form of treatment  - RTC for PTNS -she would like to try behavioral changes prior to making PTNS appointment UA is nitrate positive, with many bacteria and 6-10 WBCs will send for culture to rule out indolent infection   2. Vaginal atrophy Patient was given a sample of vaginal estrogen cream (Premarin vaginal cream) and instructed to apply 0.5mg  (pea-sized amount)  just inside the vaginal introitus with a finger-tip on Monday, Wednesday and Friday nights.  I explained to the patient that vaginally administered estrogen, which causes only a slight increase in the blood estrogen levels, have fewer contraindications and adverse systemic effects that oral HT. I have also given prescriptions for the Estrace cream and Premarin cream, so that the patient may carry them to the pharmacy to see which one of the branded creams would  be most economical for her.  If she finds both medications  cost prohibitive, she is instructed to call the office.  We can then call in a compounded vaginal estrogen cream for the patient that may be more affordable.  Explained to the patient that it takes up to 8 months to make changes within the mucosa, so this is a long-term medication She will follow up in three months for an exam.          Return for Schedule PTNS - need to check with insurance .  These notes generated with voice recognition software. I apologize for typographical errors.  Zara Council, [facility] [address] [place],  [address] [phone]

## 2018-05-26 ENCOUNTER — Ambulatory Visit (HOSPITAL_COMMUNITY)
Admission: RE | Admit: 2018-05-26 | Discharge: 2018-05-26 | Disposition: A | Discharge status: Home / Self Care | Payer: Medicare Other | Source: Ambulatory Visit | Attending: Nurse Practitioner | Admitting: Nurse Practitioner

## 2018-05-26 ENCOUNTER — Encounter: Payer: Self-pay | Admitting: Internal Medicine

## 2018-05-26 ENCOUNTER — Ambulatory Visit (INDEPENDENT_AMBULATORY_CARE_PROVIDER_SITE_OTHER): Payer: Medicare Other | Admitting: Internal Medicine

## 2018-05-26 VITALS — BP 106/64 | HR 68 | Temp 98.0°F | Ht 69.5 in | Wt 168.0 lb

## 2018-05-26 DIAGNOSIS — I4891 Unspecified atrial fibrillation: Secondary | ICD-10-CM | POA: Diagnosis not present

## 2018-05-26 DIAGNOSIS — Z0000 Encounter for general adult medical examination without abnormal findings: Secondary | ICD-10-CM

## 2018-05-26 DIAGNOSIS — Z79899 Other long term (current) drug therapy: Secondary | ICD-10-CM | POA: Diagnosis not present

## 2018-05-26 DIAGNOSIS — I951 Orthostatic hypotension: Secondary | ICD-10-CM

## 2018-05-26 DIAGNOSIS — I4819 Other persistent atrial fibrillation: Secondary | ICD-10-CM

## 2018-05-26 DIAGNOSIS — I63412 Cerebral infarction due to embolism of left middle cerebral artery: Secondary | ICD-10-CM | POA: Diagnosis not present

## 2018-05-26 DIAGNOSIS — E039 Hypothyroidism, unspecified: Secondary | ICD-10-CM

## 2018-05-26 DIAGNOSIS — M8000XP Age-related osteoporosis with current pathological fracture, unspecified site, subsequent encounter for fracture with malunion: Secondary | ICD-10-CM

## 2018-05-26 DIAGNOSIS — F341 Dysthymic disorder: Secondary | ICD-10-CM

## 2018-05-26 LAB — URINALYSIS, COMPLETE
BILIRUBIN UA: NEGATIVE
Glucose, UA: NEGATIVE
Nitrite, UA: POSITIVE — AB
PROTEIN UA: NEGATIVE
RBC UA: NEGATIVE
UUROB: 1 mg/dL (ref 0.2–1.0)
pH, UA: 5.5 (ref 5.0–7.5)

## 2018-05-26 LAB — MICROSCOPIC EXAMINATION: RBC, UA: NONE SEEN /hpf (ref 0–2)

## 2018-05-26 MED ORDER — LEVOTHYROXINE SODIUM 25 MCG PO TABS: 25.0000 ug | ORAL_TABLET | Freq: Every day | ORAL | 1 refills | Status: DC

## 2018-05-26 NOTE — Patient Instructions (Signed)
Health Maintenance for Postmenopausal Women Menopause is a normal process in which your reproductive ability comes to an end. This process happens gradually over a span of months to years, usually between the ages of 34 and 71. Menopause is complete when you have missed 12 consecutive menstrual periods. It is important to talk with your health care provider about some of the most common conditions that affect postmenopausal women, such as heart disease, cancer, and bone loss (osteoporosis). Adopting a healthy lifestyle and getting preventive care can help to promote your health and wellness. Those actions can also lower your chances of developing some of these common conditions. What should I know about menopause? During menopause, you may experience a number of symptoms, such as:  Moderate-to-severe hot flashes.  Night sweats.  Decrease in sex drive.  Mood swings.  Headaches.  Tiredness.  Irritability.  Memory problems.  Insomnia.  Choosing to treat or not to treat menopausal changes is an individual decision that you make with your health care provider. What should I know about hormone replacement therapy and supplements? Hormone therapy products are effective for treating symptoms that are associated with menopause, such as hot flashes and night sweats. Hormone replacement carries certain risks, especially as you become older. If you are thinking about using estrogen or estrogen with progestin treatments, discuss the benefits and risks with your health care provider. What should I know about heart disease and stroke? Heart disease, heart attack, and stroke become more likely as you age. This may be due, in part, to the hormonal changes that your body experiences during menopause. These can affect how your body processes dietary fats, triglycerides, and cholesterol. Heart attack and stroke are both medical emergencies. There are many things that you can do to help prevent heart disease  and stroke:  Have your blood pressure checked at least every 1-2 years. High blood pressure causes heart disease and increases the risk of stroke.  If you are 71-49 years old, ask your health care provider if you should take aspirin to prevent a heart attack or a stroke.  Do not use any tobacco products, including cigarettes, chewing tobacco, or electronic cigarettes. If you need help quitting, ask your health care provider.  It is important to eat a healthy diet and maintain a healthy weight. ? Be sure to include plenty of vegetables, fruits, low-fat dairy products, and lean protein. ? Avoid eating foods that are high in solid fats, added sugars, or salt (sodium).  Get regular exercise. This is one of the most important things that you can do for your health. ? Try to exercise for at least 150 minutes each week. The type of exercise that you do should increase your heart rate and make you sweat. This is known as moderate-intensity exercise. ? Try to do strengthening exercises at least twice each week. Do these in addition to the moderate-intensity exercise.  Know your numbers.Ask your health care provider to check your cholesterol and your blood glucose. Continue to have your blood tested as directed by your health care provider.  What should I know about cancer screening? There are several types of cancer. Take the following steps to reduce your risk and to catch any cancer development as early as possible. Breast Cancer  Practice breast self-awareness. ? This means understanding how your breasts normally appear and feel. ? It also means doing regular breast self-exams. Let your health care provider know about any changes, no matter how small.  If you are 71  or older, have a clinician do a breast exam (clinical breast exam or CBE) every year. Depending on your age, family history, and medical history, it may be recommended that you also have a yearly breast X-ray (mammogram).  If you  have a family history of breast cancer, talk with your health care provider about genetic screening.  If you are at high risk for breast cancer, talk with your health care provider about having an MRI and a mammogram every year.  Breast cancer (BRCA) gene test is recommended for women who have family members with BRCA-related cancers. Results of the assessment will determine the need for genetic counseling and BRCA1 and for BRCA2 testing. BRCA-related cancers include these types: ? Breast. This occurs in males or females. ? Ovarian. ? Tubal. This may also be called fallopian tube cancer. ? Cancer of the abdominal or pelvic lining (peritoneal cancer). ? Prostate. ? Pancreatic.  Cervical, Uterine, and Ovarian Cancer Your health care provider may recommend that you be screened regularly for cancer of the pelvic organs. These include your ovaries, uterus, and vagina. This screening involves a pelvic exam, which includes checking for microscopic changes to the surface of your cervix (Pap test).  For women ages 21-65, health care providers may recommend a pelvic exam and a Pap test every three years. For women ages 71-65, they may recommend the Pap test and pelvic exam, combined with testing for human papilloma virus (HPV), every five years. Some types of HPV increase your risk of cervical cancer. Testing for HPV may also be done on women of any age who have unclear Pap test results.  Other health care providers may not recommend any screening for nonpregnant women who are considered low risk for pelvic cancer and have no symptoms. Ask your health care provider if a screening pelvic exam is right for you.  If you have had past treatment for cervical cancer or a condition that could lead to cancer, you need Pap tests and screening for cancer for at least 20 years after your treatment. If Pap tests have been discontinued for you, your risk factors (such as having a new sexual partner) need to be  reassessed to determine if you should start having screenings again. Some women have medical problems that increase the chance of getting cervical cancer. In these cases, your health care provider may recommend that you have screening and Pap tests more often.  If you have a family history of uterine cancer or ovarian cancer, talk with your health care provider about genetic screening.  If you have vaginal bleeding after reaching menopause, tell your health care provider.  There are currently no reliable tests available to screen for ovarian cancer.  Lung Cancer Lung cancer screening is recommended for adults 69-62 years old who are at high risk for lung cancer because of a history of smoking. A yearly low-dose CT scan of the lungs is recommended if you:  Currently smoke.  Have a history of at least 30 pack-years of smoking and you currently smoke or have quit within the past 15 years. A pack-year is smoking an average of one pack of cigarettes per day for one year.  Yearly screening should:  Continue until it has been 15 years since you quit.  Stop if you develop a health problem that would prevent you from having lung cancer treatment.  Colorectal Cancer  This type of cancer can be detected and can often be prevented.  Routine colorectal cancer screening usually begins at  age 42 and continues through age 45.  If you have risk factors for colon cancer, your health care provider may recommend that you be screened at an earlier age.  If you have a family history of colorectal cancer, talk with your health care provider about genetic screening.  Your health care provider may also recommend using home test kits to check for hidden blood in your stool.  A small camera at the end of a tube can be used to examine your colon directly (sigmoidoscopy or colonoscopy). This is done to check for the earliest forms of colorectal cancer.  Direct examination of the colon should be repeated every  5-10 years until age 71. However, if early forms of precancerous polyps or small growths are found or if you have a family history or genetic risk for colorectal cancer, you may need to be screened more often.  Skin Cancer  Check your skin from head to toe regularly.  Monitor any moles. Be sure to tell your health care provider: ? About any new moles or changes in moles, especially if there is a change in a mole's shape or color. ? If you have a mole that is larger than the size of a pencil eraser.  If any of your family members has a history of skin cancer, especially at a young age, talk with your health care provider about genetic screening.  Always use sunscreen. Apply sunscreen liberally and repeatedly throughout the day.  Whenever you are outside, protect yourself by wearing long sleeves, pants, a wide-brimmed hat, and sunglasses.  What should I know about osteoporosis? Osteoporosis is a condition in which bone destruction happens more quickly than new bone creation. After menopause, you may be at an increased risk for osteoporosis. To help prevent osteoporosis or the bone fractures that can happen because of osteoporosis, the following is recommended:  If you are 46-71 years old, get at least 1,000 mg of calcium and at least 600 mg of vitamin D per day.  If you are older than age 55 but younger than age 65, get at least 1,200 mg of calcium and at least 600 mg of vitamin D per day.  If you are older than age 54, get at least 1,200 mg of calcium and at least 800 mg of vitamin D per day.  Smoking and excessive alcohol intake increase the risk of osteoporosis. Eat foods that are rich in calcium and vitamin D, and do weight-bearing exercises several times each week as directed by your health care provider. What should I know about how menopause affects my mental health? Depression may occur at any age, but it is more common as you become older. Common symptoms of depression  include:  Low or sad mood.  Changes in sleep patterns.  Changes in appetite or eating patterns.  Feeling an overall lack of motivation or enjoyment of activities that you previously enjoyed.  Frequent crying spells.  Talk with your health care provider if you think that you are experiencing depression. What should I know about immunizations? It is important that you get and maintain your immunizations. These include:  Tetanus, diphtheria, and pertussis (Tdap) booster vaccine.  Influenza every year before the flu season begins.  Pneumonia vaccine.  Shingles vaccine.  Your health care provider may also recommend other immunizations. This information is not intended to replace advice given to you by your health care provider. Make sure you discuss any questions you have with your health care provider. Document Released: 09/11/2005  Document Revised: 02/07/2016 Document Reviewed: 04/23/2015 Elsevier Interactive Patient Education  2018 [employer] Inc[employer]  

## 2018-05-26 NOTE — Progress Notes (Signed)
HPI:  Pt presents to the clinic today for her Medicare Wellness Exam. She is also due to follow up chronic conditions.  Anxiety and Depression: Worse after the recent death of her husband. She is going to grief counseling. She is taking Dextrostat and Cymbalta. She is unsure if she stopped her Abilify. She follows with Dr. Charlott Holler. She denies SI/HI.  Afib: s/p failed ablation. Controlled on Cardizem and Eliquis. ECG from 05/2018 reviewed. She follows with Dr. Rayann Heman.  CVA: She is taking Eliquis as prescribed.   Orthostatic Hypotension: Controlled on Flecanide. She follows with Dr. Rayann Heman.  Hypothyroidism: She denies any issues on her current dose of Levothyroxine. Labs from 05/2018 reviewed.  Osteoporosis: bone density form 02/2016 reviewed. She is taking Prolia injections.  Past Medical History:  Diagnosis Date  . Anxiety    controlled with meds  . Anxiety   . Arrhythmia   . Atrial fibrillation ([name])    paroxysmal, failed medical therapy with flecainide,  NSVT with tikosyn  . Bruises easily   . CVA (cerebral vascular accident) ([place]) 12/2007  . Depression    medically controlled   . Low blood pressure    no falls, but if gets up to fast  . Stroke ([place]) 7 years ago   Lasting effects on balance, different personality.  . Thyroid disease   . 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 acetaminophen (TYLENOL) 500 MG tablet Take 2 tablets by mouth every 6 hours as needed for pain    . calcium carbonate (OS-CAL) 600 MG TABS Take 600 mg by mouth daily with breakfast.     . cholecalciferol (VITAMIN D) 1000 UNITS tablet Take 2,000 Units by mouth daily.     Marland Kitchen conjugated estrogens (PREMARIN) vaginal cream Apply 0.5mg  (pea-sized amount)  just inside the vaginal introitus with a finger-tip on  Monday, Wednesday and Friday nights. 30 g 12  . denosumab (PROLIA) 60 MG/ML SOLN injection Inject into  the skin.    Marland Kitchen dextroamphetamine (DEXTROSTAT) 5 MG tablet Take 5 mg by mouth daily.     Marland Kitchen diltiazem (CARDIZEM CD) 240 MG 24 hr capsule Take 1 capsule (240 mg total) by mouth daily. May take extra capsule daily as needed for afib 95 capsule 1  . diltiazem (CARDIZEM) 30 MG tablet Ttake 1 tablet every 4 hours AS NEEDED for rapid afib heart rate over 100 45 tablet 1  . DULoxetine (CYMBALTA) 30 MG capsule Take 30 mg by mouth as directed. TAKE 1 CAPSULES EVERY MORNING AND 1 CAPSULE AT NIGHT    . ELIQUIS 5 MG TABS tablet TAKE ONE TABLET BY MOUTH TWICE DAILY 180 tablet 1  . estradiol (ESTRACE VAGINAL) 0.1 MG/GM vaginal cream Apply 0.5mg  (pea-sized amount)  just inside the vaginal introitus with a finger-tip on Monday, Wednesday and Friday nights. 30 g 12  . flecainide (TAMBOCOR) 100 MG tablet Take 0.5 tablets (50 mg total) by mouth 2 (two) times daily. 180 tablet 2  . furosemide (LASIX) 20 MG tablet Take 0.5 tablets (10 mg total) by mouth as needed for edema. 10 tablet 3  . levothyroxine (SYNTHROID, LEVOTHROID) 25 MCG tablet Take 1 tablet (25 mcg total) by mouth daily. 90 tablet 0  . Multiple Vitamin (MULTIVITAMIN) tablet Take 1 tablet by mouth daily.      . Oral Electrolytes (BUFFERED SALT PO) Take 1 capsule by mouth daily as needed (  Electrolyte/Salt supplement).     . Timolol Maleate (TIMOPTIC OP) Place 1 drop into the left eye daily.     . vitamin B-12 (CYANOCOBALAMIN) 1000 MCG tablet Take 1,000 mcg by mouth daily.    . vitamin C (ASCORBIC ACID) 500 MG tablet Take 500 mg by mouth daily.    . vitamin E 400 UNIT capsule Take 400 Units by mouth daily.       No current facility-administered medications for this visit.     Allergies  Allergen Reactions  . Darvon Other (See Comments)    Severe panic attacks  . Propoxyphene Other (See Comments)    GI Upset Severe panic attacks GI Upset  . Propoxyphene N-Acetaminophen Other (See Comments)    Severe panic attacks  . Levofloxacin Anxiety and Other  (See Comments)    Causes panic attacks.    Family History  Problem Relation Age of Onset  . Multiple sclerosis Father   . Breast cancer Neg Hx     Social History   Socioeconomic History  . Marital status: Widowed    Spouse name: Not on file  . Number of children: 0  . Years of education: Not on file  . Highest education level: Not on file  Occupational History  . Occupation: Retired    Fish farm manager: RETIRED  Social Needs  . Financial resource strain: Not on file  . Food insecurity:    Worry: Not on file    Inability: Not on file  . Transportation needs:    Medical: Not on file    Non-medical: Not on file  Tobacco Use  . Smoking status: Never Smoker  . Smokeless tobacco: Never Used  Substance and Sexual Activity  . Alcohol use: Yes    Alcohol/week: 1.0 standard drinks    Types: 1 Standard drinks or equivalent per week    Comment: regular  . Drug use: No  . Sexual activity: Never  Lifestyle  . Physical activity:    Days per week: Not on file    Minutes per session: Not on file  . Stress: Not on file  Relationships  . Social connections:    Talks on phone: Not on file    Gets together: Not on file    Attends religious service: Not on file    Active member of club or organization: Not on file    Attends meetings of clubs or organizations: Not on file    Relationship status: Not on file  . Intimate partner violence:    Fear of current or ex partner: Not on file    Emotionally abused: Not on file    Physically abused: Not on file    Forced sexual activity: Not on file  Other Topics Concern  . Not on file  Social History Narrative   Lives in [place] with her husband.  No children.  Former Psychologist, prison and probation services   Enjoys exercise- water classes, yoga Editor, commissioning.       Has a living will-    Would desire CPR   Would not want prolonged life support if futile.    Hospitiliaztions: None  Health Maintenance:    Flu: 05/2018  Tetanus: 10/2015  Pneumovax:  04/2015  Prevnar: 10/2013  Zostavax: 04/2008  Shingrix: never  Mammogram: 07/2017  Pap Smear: hysterectomy  Bone Density: 02/2016  Colon Screening: 03/2014  Eye Doctor: quarterly  Dental Exam: biannually   Providers:   PCP: Webb Silversmith, NP-C  Cardiologist: Dr. Rayann Heman  Psychiatrist: Dr. Charlott Holler  Neurology: Dr. Mardene Celeste  Dermatology: Dr. Will Bonnet  Orthopedics: Dr. Rudene Christians  I have personally reviewed and have noted:  1. The patient's medical and social history 2. Their use of alcohol, tobacco or illicit drugs 3. Their current medications and supplements 4. The patient's functional ability including ADL's, fall risks, home safety risks and hearing or visual impairment. 5. Diet and physical activities 6. Evidence for depression or mood disorder  Subjective:   Review of Systems:   Constitutional: Denies fever, malaise, fatigue, headache or abrupt weight changes.  HEENT: Denies eye pain, eye redness, ear pain, ringing in the ears, wax buildup, runny nose, nasal congestion, bloody nose, or sore throat. Respiratory: Denies difficulty breathing, shortness of breath, cough or sputum production.   Cardiovascular: Denies chest pain, chest tightness, palpitations or swelling in the hands or feet.  Gastrointestinal: Denies abdominal pain, bloating, constipation, diarrhea or blood in the stool.  GU: Denies urgency, frequency, pain with urination, burning sensation, blood in urine, odor or discharge. Musculoskeletal: Denies decrease in range of motion, difficulty with gait, muscle pain or joint pain and swelling.  Skin: Denies redness, rashes, lesions or ulcercations.  Neurological: Pt reports intermittent issues with balance. Denies dizziness, difficulty with memory, difficulty with speech or problems with coordination.  Psych: Pt reports anxiety and depression. Denies SI/HI.  No other specific complaints in a complete review of systems (except as listed in HPI above).  Objective:  PE:   BP  106/64   Pulse 68   Temp 98 F (36.7 C) (Oral)   Ht 5' 9.5" (1.765 m)   Wt 168 lb (76.2 kg)   SpO2 98%   BMI 24.45 kg/m  Wt Readings from Last 3 Encounters:  05/26/18 168 lb (76.2 kg)  05/25/18 169 lb 3.2 oz (76.7 kg)  05/19/18 167 lb (75.8 kg)    General: Appears her stated age, well developed, well nourished in NAD. Skin: Warm, dry and intact. No rashes, lesions or ulcerations noted. HEENT: Head: normal shape and size; Eyes: sclera white, no icterus, conjunctiva pink, PERRLA and EOMs intact; Ears: Tm's gray and intact, normal light reflex; Throat/Mouth: Teeth present, mucosa pink and moist, no exudate, lesions or ulcerations noted.  Neck: Neck supple, trachea midline. No masses, lumps or thyromegaly present.  Cardiovascular: Normal rate with irregular rhythm.  No murmur, rubs or gallops noted. No JVD or BLE edema. No carotid bruits noted. Pulmonary/Chest: Normal effort and positive vesicular breath sounds. No respiratory distress. No wheezes, rales or ronchi noted.  Abdomen: Soft and nontender. Normal bowel sounds. No distention or masses noted. Liver, spleen and kidneys non palpable. Musculoskeletal: Normal range of motion. Strength 5/5 BUE/BLE. No signs of joint swelling.  Neurological: Alert and oriented. Cranial nerves II-XII grossly intact. Coordination normal.  Psychiatric: Mood and affect mildly flat. Behavior is normal. Judgment and thought content normal.     BMET    Component Value Date/Time   NA 134 (L) 05/10/2018 0934   NA 139 08/20/2017 1619   NA 136 11/07/2014 1539   K 4.2 05/10/2018 0934   K 4.3 11/07/2014 1539   CL 98 05/10/2018 0934   CL 100 (L) 11/07/2014 1539   CO2 30 05/10/2018 0934   CO2 28 11/07/2014 1539   GLUCOSE 83 05/10/2018 0934   GLUCOSE 107 (H) 11/07/2014 1539   BUN 24 (H) 05/10/2018 0934   BUN 17 08/20/2017 1619   BUN 22 (H) 11/07/2014 1539   CREATININE 0.69 05/10/2018 0934   CREATININE 0.60 11/07/2014 1539  CALCIUM 9.4 05/10/2018 0934    CALCIUM 9.2 11/07/2014 1539   GFRNONAA 84 08/20/2017 1619   GFRNONAA >60 11/07/2014 1539   GFRAA 96 08/20/2017 1619   GFRAA >60 11/07/2014 1539    Lipid Panel     Component Value Date/Time   CHOL 219 (H) 05/10/2018 0934   TRIG 57.0 05/10/2018 0934   HDL 97.50 05/10/2018 0934   CHOLHDL 2 05/10/2018 0934   VLDL 11.4 05/10/2018 0934   LDLCALC 110 (H) 05/10/2018 0934   LDLCALC 99 05/07/2017 1619    CBC    Component Value Date/Time   WBC 5.2 05/10/2018 0934   RBC 4.52 05/10/2018 0934   HGB 13.8 05/10/2018 0934   HGB 13.0 08/20/2017 1619   HCT 41.7 05/10/2018 0934   HCT 38.1 08/20/2017 1619   PLT 300.0 05/10/2018 0934   PLT 286 08/20/2017 1619   MCV 92.3 05/10/2018 0934   MCV 89 08/20/2017 1619   MCV 92 11/07/2014 1539   MCH 30.2 08/20/2017 1619   MCH 30.0 11/07/2014 1539   MCH 30.3 04/13/2011 0500   MCHC 33.1 05/10/2018 0934   RDW 14.3 05/10/2018 0934   RDW 14.1 08/20/2017 1619   RDW 13.9 11/07/2014 1539   LYMPHSABS 1.7 11/19/2016 0852   MONOABS 0.6 11/19/2016 0852   EOSABS 0.2 11/19/2016 0852   BASOSABS 0.0 11/19/2016 0852    Hgb A1C No results found for: HGBA1C    Assessment and Plan:   Medicare Annual Wellness Visit:  Diet: She does eat meat. She consumes fruits and veggies daily. She tries to avoid fried foods. She drinks mostly coffee, water. Physical activity: Walking Depression/mood screen: Chronic, on meds, follows with psychiatry Hearing: Intact to whispered voice Visual acuity: Grossly normal, performs annual eye exam  ADLs: Capable Fall risk: None Home safety: Good Cognitive evaluation: Intact to orientation, naming, recall and repetition EOL planning: Adv directives, full code/ I agree  Preventative Medicine: Flu, tetanus, pneumovax, prevnar and zostovax UTD. She declines shingrix. She will schedule her mammogram for 07/2018. She declines pap smear/pelvic exam, bone density. Colon screening UTD. Encouraged her to consume a balanced diet and  exercise regimen. Advised her to see an eye doctor and dentist annually. Labs from 05/2018 reviewed.   Next appointment: 1 year Medicare Wellness Exam   Webb Silversmith, NP

## 2018-05-26 NOTE — Progress Notes (Addendum)
Pt in for EKG. To be reviewed by Roderic Palau, NP  Pt is in the afib clinic for repeat EKG. On last visit, qtc interval was long and flecainide was reduced to 50 mg bid from 100 mg bid. EKG today shows SR at 64 bpm, pr int 150 ms, qrs int 84 ms, qtc 394 ms. She will continue 50 mg fleciande bid.

## 2018-05-27 ENCOUNTER — Telehealth: Payer: Self-pay | Admitting: Urology

## 2018-05-27 NOTE — Telephone Encounter (Signed)
Pt called to advise Larene [place] that she does not wish to have PTNS due to having A Fib. Spoke to [place], asked me to reach back out to pt and let her know that we can get her cleared by her Cardiologist to proceed with PTNS, called pt back, Pt still refuses, pt states she has worked really hard and has been thru so much to get her heart "right" again and the smallest tiniest thing sets her back into A Fib which is scary and takes a lot to get back under control, pt reiterated that she has no desire to proceed with PTNS. FYI

## 2018-05-29 ENCOUNTER — Encounter: Payer: Self-pay | Admitting: Internal Medicine

## 2018-05-29 LAB — CULTURE, URINE COMPREHENSIVE

## 2018-05-29 NOTE — Assessment & Plan Note (Signed)
Continue current dose of Levothyroxine, refilled today

## 2018-05-29 NOTE — Assessment & Plan Note (Signed)
She is currently on Eliquis, no statin She will continue to follow with neurology

## 2018-05-29 NOTE — Assessment & Plan Note (Signed)
Continue Cardizem and Eliquis She will continue to follow with cardiology

## 2018-05-29 NOTE — Assessment & Plan Note (Signed)
Encouraged 30 minutes of weight bearing exercise daily Continue Prolia

## 2018-05-29 NOTE — Assessment & Plan Note (Signed)
Slightly worse due to grief but she is not interested in medication adjustment at this time Continue Dextrostat and Cymbalta She will continue to follow with psych

## 2018-05-29 NOTE — Assessment & Plan Note (Signed)
Stable on Flecanide She will continue to follow with cardiology

## 2018-05-30 ENCOUNTER — Telehealth: Payer: Self-pay

## 2018-05-30 DIAGNOSIS — B9620 Unspecified Escherichia coli [E. coli] as the cause of diseases classified elsewhere: Secondary | ICD-10-CM

## 2018-05-30 DIAGNOSIS — N390 Urinary tract infection, site not specified: Principal | ICD-10-CM

## 2018-05-30 MED ORDER — AMOXICILLIN-POT CLAVULANATE 875-125 MG PO TABS: 1.0000 | ORAL_TABLET | Freq: Two times a day (BID) | ORAL | 0 refills | Status: AC

## 2018-05-30 NOTE — Telephone Encounter (Signed)
-----   Message from Nori Riis, PA-C sent at 05/30/2018  7:43 AM EDT ----- Please let Mrs. Stilley know that her urine culture is positive for infection.  She needs to start Augmentin 875/125, twice daily for seven days.  She needs a follow up appointment for recheck in three months.

## 2018-05-30 NOTE — Telephone Encounter (Signed)
Called pt, no answer. LM for pt informing her of the information below. Advised pt to call back for questions or concerns. RX sent in.

## 2018-06-01 ENCOUNTER — Telehealth: Payer: Self-pay | Admitting: Internal Medicine

## 2018-06-01 DIAGNOSIS — Z8673 Personal history of transient ischemic attack (TIA), and cerebral infarction without residual deficits: Secondary | ICD-10-CM

## 2018-06-01 DIAGNOSIS — R2689 Other abnormalities of gait and mobility: Secondary | ICD-10-CM

## 2018-06-01 NOTE — Telephone Encounter (Signed)
Pt called about being referred to [facility] Services/Physical Therapy due to her limp b/c of a stroke. Please contact Donita Brooks [phone].

## 2018-06-01 NOTE — Addendum Note (Signed)
Addended by: Jearld Fenton on: 06/01/2018 04:05 PM   Modules accepted: Orders

## 2018-06-01 NOTE — Telephone Encounter (Signed)
Referral placed.

## 2018-06-07 ENCOUNTER — Encounter: Payer: Self-pay | Admitting: Cardiology

## 2018-06-07 ENCOUNTER — Ambulatory Visit (INDEPENDENT_AMBULATORY_CARE_PROVIDER_SITE_OTHER): Payer: Medicare Other | Admitting: Emergency Medicine

## 2018-06-07 ENCOUNTER — Telehealth: Payer: Self-pay | Admitting: Internal Medicine

## 2018-06-07 DIAGNOSIS — M8000XP Age-related osteoporosis with current pathological fracture, unspecified site, subsequent encounter for fracture with malunion: Secondary | ICD-10-CM | POA: Diagnosis not present

## 2018-06-07 MED ORDER — DENOSUMAB 60 MG/ML ~~LOC~~ SOSY
60.0000 mg | PREFILLED_SYRINGE | Freq: Once | SUBCUTANEOUS | Status: AC
  Administered 2018-06-07: 60 mg via SUBCUTANEOUS

## 2018-06-07 NOTE — Telephone Encounter (Signed)
° °  Patient called to report she has been in and out of afib. Declined additional details, other than HR over 100 on 11/4.

## 2018-06-07 NOTE — Telephone Encounter (Signed)
LMTCB

## 2018-06-07 NOTE — Progress Notes (Signed)
Per orders of NP Baity , injection of prolia given by Elmon Kirschner. Patient tolerated injection well. Patient received injection in right arm.

## 2018-06-09 NOTE — Telephone Encounter (Signed)
Message sent via MyChart.

## 2018-06-10 ENCOUNTER — Encounter: Payer: Self-pay | Admitting: Internal Medicine

## 2018-06-10 ENCOUNTER — Other Ambulatory Visit: Payer: Self-pay | Admitting: Psychiatry

## 2018-06-10 ENCOUNTER — Ambulatory Visit: Payer: Medicare Other | Admitting: Internal Medicine

## 2018-06-10 VITALS — BP 98/64 | HR 67 | Ht 69.0 in | Wt 163.6 lb

## 2018-06-10 DIAGNOSIS — I480 Paroxysmal atrial fibrillation: Secondary | ICD-10-CM

## 2018-06-10 MED ORDER — FLECAINIDE ACETATE 100 MG PO TABS: 100.0000 mg | ORAL_TABLET | Freq: Two times a day (BID) | ORAL | 1 refills | Status: DC

## 2018-06-10 NOTE — Progress Notes (Signed)
PCP: Jearld Fenton, NP   Primary EP: Dr Sherryll Burger is a 72 y.o. female who presents today for routine electrophysiology followup.  Since last being seen in our clinic, the patient reports doing very well.  Today, she denies symptoms of palpitations, chest pain, shortness of breath,  lower extremity edema, dizziness, presyncope, or syncope.  Her husband died in the past 2 months. She is tearful.  The patient is otherwise without complaint today.   Past Medical History:  Diagnosis Date  . Anxiety    controlled with meds  . Anxiety   . Arrhythmia   . Atrial fibrillation ([name])    paroxysmal, failed medical therapy with flecainide,  NSVT with tikosyn  . Bruises easily   . CVA (cerebral vascular accident) ([place]) 12/2007  . Depression    medically controlled   . Low blood pressure    no falls, but if gets up to fast  . Stroke ([place]) 7 years ago   Lasting effects on balance, different personality.  . Thyroid disease   . 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 ATRIAL FIBRILLATION ABLATION  10/18/12   PVI by Dr Rayann Heman  . ATRIAL FIBRILLATION ABLATION N/A 10/18/2012   Procedure: ATRIAL FIBRILLATION ABLATION;  Surgeon: Thompson Grayer, MD;  Location: [facility] CATH LAB;  Service: Cardiovascular;  Laterality: N/A;  . EYE SURGERY     on L/R eye, macular hole   . OPEN REDUCTION INTERNAL FIXATION (ORIF) DISTAL RADIAL FRACTURE Right 11/15/2014   Procedure: OPEN REDUCTION INTERNAL FIXATION (ORIF) RIGHT DISTAL RADIAL FRACTURE WITH ALLOGRAFT BONE GRAFT;  Surgeon: Roseanne Kaufman, MD;  Location: [place];  Service: Orthopedics;  Laterality: Right;  . TEE WITHOUT CARDIOVERSION N/A 10/18/2012   Procedure: TRANSESOPHAGEAL ECHOCARDIOGRAM (TEE);  Surgeon: Lelon Perla, MD;  Location: [facility] ENDOSCOPY;  Service: Cardiovascular;  Laterality: N/A;  Pre-Ablation at 12pm  . TONSILLECTOMY    . VAGINAL  HYSTERECTOMY      ROS- all systems are reviewed and negatives except as per HPI above  Current Outpatient Medications  Medication Sig Dispense Refill  . acetaminophen (TYLENOL) 500 MG tablet Take 2 tablets by mouth every 6 hours as needed for pain    . calcium carbonate (OS-CAL) 600 MG TABS Take 600 mg by mouth daily with breakfast.     . cholecalciferol (VITAMIN D) 1000 UNITS tablet Take 2,000 Units by mouth daily.     Marland Kitchen conjugated estrogens (PREMARIN) vaginal cream Apply 0.5mg  (pea-sized amount)  just inside the vaginal introitus with a finger-tip on  Monday, Wednesday and Friday nights. 30 g 12  . denosumab (PROLIA) 60 MG/ML SOLN injection Inject into the skin.    Marland Kitchen dextroamphetamine (DEXTROSTAT) 5 MG tablet Take 5 mg by mouth daily.     Marland Kitchen diltiazem (CARDIZEM CD) 240 MG 24 hr capsule Take 1 capsule (240 mg total) by mouth daily. May take extra capsule daily as needed for afib 95 capsule 1  . diltiazem (CARDIZEM) 30 MG tablet Ttake 1 tablet every 4 hours AS NEEDED for rapid afib heart rate over 100 45 tablet 1  . DULoxetine (CYMBALTA) 30 MG capsule Take 30 mg by mouth as directed. TAKE 1 CAPSULES EVERY MORNING AND 1 CAPSULE AT NIGHT    . ELIQUIS 5 MG TABS tablet TAKE ONE TABLET BY MOUTH TWICE DAILY 180 tablet 1  . estradiol (ESTRACE VAGINAL)  0.1 MG/GM vaginal cream Apply 0.5mg  (pea-sized amount)  just inside the vaginal introitus with a finger-tip on Monday, Wednesday and Friday nights. 30 g 12  . flecainide (TAMBOCOR) 100 MG tablet Take 0.5 tablets (50 mg total) by mouth 2 (two) times daily. 180 tablet 2  . furosemide (LASIX) 20 MG tablet Take 0.5 tablets (10 mg total) by mouth as needed for edema. 10 tablet 3  . levothyroxine (SYNTHROID, LEVOTHROID) 25 MCG tablet Take 1 tablet (25 mcg total) by mouth daily. 90 tablet 1  . Multiple Vitamin (MULTIVITAMIN) tablet Take 1 tablet by mouth daily.      . Oral Electrolytes (BUFFERED SALT PO) Take 1 capsule by mouth daily as needed (Electrolyte/Salt  supplement).     . Timolol Maleate (TIMOPTIC OP) Place 1 drop into the left eye daily.     . vitamin B-12 (CYANOCOBALAMIN) 1000 MCG tablet Take 1,000 mcg by mouth daily.    . vitamin C (ASCORBIC ACID) 500 MG tablet Take 500 mg by mouth daily.    . vitamin E 400 UNIT capsule Take 400 Units by mouth daily.       No current facility-administered medications for this visit.     Physical Exam: Vitals:   06/10/18 0952  BP: 98/64  Pulse: 67  SpO2: 98%  Weight: 163 lb 9.6 oz (74.2 kg)  Height: 5\' 9"  (1.753 m)    GEN- The patient is well appearing, alert and oriented x 3 today.   Head- normocephalic, atraumatic Eyes-  Sclera clear, conjunctiva pink Ears- hearing intact Oropharynx- clear Lungs- Clear to ausculation bilaterally, normal work of breathing Heart- Regular rate and rhythm, no murmurs, rubs or gallops, PMI not laterally displaced GI- soft, NT, ND, + BS Extremities- no clubbing, cyanosis, or edema  Wt Readings from Last 3 Encounters:  06/10/18 163 lb 9.6 oz (74.2 kg)  05/26/18 168 lb (76.2 kg)  05/25/18 169 lb 3.2 oz (76.7 kg)    EKG tracing ordered today is personally reviewed and shows sinus rhythm 67 bpm, PR 150 msec, QRS 92 msec, QTc 473 msec  Assessment and Plan:  1. Paroxysmal afib She has had increasing afib despite flecainide She had NSVT on tikosyn and was not continued on this She is s/p AF ablation 2014. I think ultimately, we should consider repeat ablation.  She is grieving her husbands death and is not in a position to consider ablation now.  I will increase flecainide to 100mg  BID today.  Return for an ekg in the [place] office next week  chads2vasc score is 4.  On eliquis  Return in 3 months  Thompson Grayer MD, [facility] 06/10/2018 10:05 AM

## 2018-06-10 NOTE — Patient Instructions (Addendum)
Medication Instructions:  Your physician has recommended you make the following change in your medication:  1. INCREASE Flecainide to 100 mg twice a day  If you need a refill on your cardiac medications before your next appointment, please call your pharmacy.   Lab work: None ordered  Testing/Procedures: None ordered  Follow-Up: Your physician recommends that you schedule a follow-up appointment in: 1 week for an EKG nurse visit in [place].  At [facility], you and your health needs are our priority.  As part of our continuing mission to provide you with exceptional heart care, we have created designated Provider Care Teams.  These Care Teams include your primary Cardiologist (physician) and Advanced Practice Providers (APPs -  Physician Assistants and Nurse Practitioners) who all work together to provide you with the care you need, when you need it. . You will need a follow up appointment in 3 months with Dr. Rayann Heman.  Thank you for choosing [facility]!!

## 2018-06-22 ENCOUNTER — Ambulatory Visit (INDEPENDENT_AMBULATORY_CARE_PROVIDER_SITE_OTHER): Payer: Medicare Other | Admitting: *Deleted

## 2018-06-22 ENCOUNTER — Ambulatory Visit: Payer: Medicare Other

## 2018-06-22 VITALS — BP 98/64 | HR 65 | Ht 69.0 in | Wt 167.5 lb

## 2018-06-22 DIAGNOSIS — I480 Paroxysmal atrial fibrillation: Secondary | ICD-10-CM

## 2018-06-22 NOTE — Patient Instructions (Signed)
Medication Instructions:  - Your physician recommends that you continue on your current medications as directed. Please refer to the Current Medication list given to you today.  If you need a refill on your cardiac medications before your next appointment, please call your pharmacy.   Lab work: - none ordered  If you have labs (blood work) drawn today and your tests are completely normal, you will receive your results only by: Marland Kitchen MyChart Message (if you have MyChart) OR . A paper copy in the mail If you have any lab test that is abnormal or we need to change your treatment, we will call you to review the results.  Testing/Procedures: - none ordered  Follow-Up: At [facility], you and your health needs are our priority.  As part of our continuing mission to provide you with exceptional heart care, we have created designated Provider Care Teams.  These Care Teams include your primary Cardiologist (physician) and Advanced Practice Providers (APPs -  Physician Assistants and Nurse Practitioners) who all work together to provide you with the care you need, when you need it. . Per Dr. Rayann Heman  Any Other Special Instructions Will Be Listed Below (If Applicable). - N/A

## 2018-06-22 NOTE — Progress Notes (Signed)
1.) Reason for visit: EKG  2.) Name of MD requesting visit: Allred  3.) HT&P: The patient has a history of a-fib. She is s/p ablation in 2014. She has been followed by Dr. Rayann Heman and last seen in clinic on 06/10/18. Her flecainide was increased at that time to 100 mg BID. She is here today for a follow up EKG due to flecainide dose increase.   4.) ROS related to problem: The patient is without complaints today. She is concerned as Dr. Rayann Heman feels she may need repeat ablation of her atrial fibrillation. All medications reviewed with the patient- she states she has not missed any doses of her flecainide or eliquis.   5.) Assessment and plan per MD: EKG obtained today shows the patient is in NSR at 65 bpm. This was reviewed with Dr. Saunders Revel- no current recommendations were made. Will forward a copy of the nurse visit and EKG to Dr. Rayann Heman for further review. The patient was made aware of the above plan and is agreeable.

## 2018-06-26 ENCOUNTER — Telehealth: Payer: Self-pay | Admitting: Internal Medicine

## 2018-06-26 NOTE — Telephone Encounter (Signed)
EKG tracing reviewed.  Very poor quality ekg makes interpretation of qt interval very difficult.  Ideally, ekg should have been repeated in office. I will reassess at follow-up. If she has any additional issues, sooner follow-up in AF clinic should be arranged.

## 2018-06-27 ENCOUNTER — Encounter: Payer: Self-pay | Admitting: Emergency Medicine

## 2018-06-27 ENCOUNTER — Other Ambulatory Visit: Payer: Self-pay | Admitting: Internal Medicine

## 2018-06-27 ENCOUNTER — Ambulatory Visit (INDEPENDENT_AMBULATORY_CARE_PROVIDER_SITE_OTHER): Payer: Medicare Other | Admitting: *Deleted

## 2018-06-27 VITALS — HR 68

## 2018-06-27 DIAGNOSIS — I480 Paroxysmal atrial fibrillation: Secondary | ICD-10-CM | POA: Diagnosis not present

## 2018-06-27 DIAGNOSIS — Z1231 Encounter for screening mammogram for malignant neoplasm of breast: Secondary | ICD-10-CM

## 2018-06-27 DIAGNOSIS — F419 Anxiety disorder, unspecified: Secondary | ICD-10-CM

## 2018-06-27 NOTE — Telephone Encounter (Signed)
Dr. Rayann Heman,  We did try several times to get the EKG to clear up, but this was the best quality image we good get on the patient.  I apologize.

## 2018-06-27 NOTE — Progress Notes (Signed)
Repeat EKG- to try to obtain better quality image- see formal nurse visit dated 06/22/18.   Patient without symptoms.   EKG leads had to be adjusted minimally several times to eliminate artifact in leads I & II.   2 EKG tracings to Dr. Rayann Heman to review for flecainide.

## 2018-07-04 DIAGNOSIS — L2489 Irritant contact dermatitis due to other agents: Secondary | ICD-10-CM | POA: Diagnosis not present

## 2018-07-04 DIAGNOSIS — L218 Other seborrheic dermatitis: Secondary | ICD-10-CM | POA: Diagnosis not present

## 2018-07-04 DIAGNOSIS — L570 Actinic keratosis: Secondary | ICD-10-CM | POA: Diagnosis not present

## 2018-07-04 DIAGNOSIS — D229 Melanocytic nevi, unspecified: Secondary | ICD-10-CM | POA: Diagnosis not present

## 2018-07-04 DIAGNOSIS — L814 Other melanin hyperpigmentation: Secondary | ICD-10-CM | POA: Diagnosis not present

## 2018-07-06 ENCOUNTER — Other Ambulatory Visit: Payer: Self-pay | Admitting: Psychiatry

## 2018-07-11 ENCOUNTER — Encounter: Payer: Self-pay | Admitting: Psychiatry

## 2018-07-11 ENCOUNTER — Ambulatory Visit (INDEPENDENT_AMBULATORY_CARE_PROVIDER_SITE_OTHER): Payer: Medicare Other | Admitting: Psychiatry

## 2018-07-11 DIAGNOSIS — F331 Major depressive disorder, recurrent, moderate: Secondary | ICD-10-CM | POA: Diagnosis not present

## 2018-07-11 DIAGNOSIS — F411 Generalized anxiety disorder: Secondary | ICD-10-CM

## 2018-07-11 DIAGNOSIS — F908 Attention-deficit hyperactivity disorder, other type: Secondary | ICD-10-CM | POA: Diagnosis not present

## 2018-07-11 MED ORDER — DEXTROAMPHETAMINE SULFATE ER 5 MG PO CP24: 5.0000 mg | ORAL_CAPSULE | Freq: Every day | ORAL | 0 refills | Status: DC

## 2018-07-11 NOTE — Progress Notes (Signed)
GLORIOUS FLICKER [id number] Sep 13, 1945 72 y.o.  Subjective:   Patient ID:  Monica Whitaker is a 72 y.o. (DOB June 22, 1946) female.  Chief Complaint:  Chief Complaint  Patient presents with  . Follow-up    Medication management  . Anxiety    Holiday stress  . Depression    Grieving    HPI Monica Whitaker presents to the office today for follow-up of worsening depression and anxiety over care of dying and now deceased husband.  Counseling helped a lot and she feels much better at this time.  Much more functional and productive.  More outgoing.  Patient reports stable mood and denies depressed or irritable moods.  Patient denies any recent difficulty with anxiety.  Patient denies difficulty with sleep initiation or maintenance. Denies appetite disturbance.  Patient reports that energy and motivation have been good.  Patient denies any difficulty with concentration.  Patient denies any suicidal ideation.  Hospice counselor Earnest Bailey was excellent and very helpful.  Review of Systems:  Review of Systems  Neurological: Negative for tremors and weakness.  Psychiatric/Behavioral: Negative for agitation, behavioral problems, confusion, decreased concentration, dysphoric mood, hallucinations, self-injury, sleep disturbance and suicidal ideas. The patient is not nervous/anxious and is not hyperactive.     Medications: I have reviewed the patient's current medications.  Current Outpatient Medications  Medication Sig Dispense Refill  . acetaminophen (TYLENOL) 500 MG tablet Take 2 tablets by mouth every 6 hours as needed for pain    . calcium carbonate (OS-CAL) 600 MG TABS Take 600 mg by mouth daily with breakfast.     . cholecalciferol (VITAMIN D) 1000 UNITS tablet Take 2,000 Units by mouth daily.     Marland Kitchen denosumab (PROLIA) 60 MG/ML SOLN injection Inject into the skin.    Marland Kitchen diltiazem (CARDIZEM CD) 240 MG 24 hr capsule Take 1 capsule (240 mg total) by mouth daily. May take extra capsule daily as  needed for afib 95 capsule 1  . diltiazem (CARDIZEM) 30 MG tablet Ttake 1 tablet every 4 hours AS NEEDED for rapid afib heart rate over 100 45 tablet 1  . DULoxetine (CYMBALTA) 30 MG capsule Take 30 mg by mouth as directed. TAKE 1 CAPSULES EVERY MORNING AND 1 CAPSULE AT NIGHT    . DULoxetine (CYMBALTA) 60 MG capsule Take 60 mg by mouth daily. Take with 30 mg duloxetine    . ELIQUIS 5 MG TABS tablet TAKE ONE TABLET BY MOUTH TWICE DAILY 180 tablet 1  . estradiol (ESTRACE VAGINAL) 0.1 MG/GM vaginal cream Apply 0.5mg  (pea-sized amount)  just inside the vaginal introitus with a finger-tip on Monday, Wednesday and Friday nights. 30 g 12  . flecainide (TAMBOCOR) 100 MG tablet Take 1 tablet (100 mg total) by mouth 2 (two) times daily. 180 tablet 1  . furosemide (LASIX) 20 MG tablet Take 0.5 tablets (10 mg total) by mouth as needed for edema. 10 tablet 3  . levothyroxine (SYNTHROID, LEVOTHROID) 25 MCG tablet Take 1 tablet (25 mcg total) by mouth daily. 90 tablet 1  . Multiple Vitamin (MULTIVITAMIN) tablet Take 1 tablet by mouth daily.      . Oral Electrolytes (BUFFERED SALT PO) Take 1 capsule by mouth daily as needed (Electrolyte/Salt supplement).     . Timolol Maleate (TIMOPTIC OP) Place 1 drop into the left eye daily.     . vitamin B-12 (CYANOCOBALAMIN) 1000 MCG tablet Take 1,000 mcg by mouth daily.    . vitamin C (ASCORBIC ACID) 500 MG tablet Take 500 mg  by mouth daily.    . vitamin E 400 UNIT capsule Take 400 Units by mouth daily.      Marland Kitchen dextroamphetamine (DEXEDRINE SPANSULE) 5 MG 24 hr capsule Take 1 capsule (5 mg total) by mouth daily. 90 capsule 0  . pramipexole (MIRAPEX) 0.125 MG tablet TAKE 2 TABLETS BY MOUTH EVERY MORNING (Patient not taking: TAKE 2 TABLETS BY MOUTH EVERY MORNING) 60 tablet 2   No current facility-administered medications for this visit.     Medication Side Effects: None  Allergies:  Allergies  Allergen Reactions  . Darvon Other (See Comments)    Severe panic attacks  .  Propoxyphene Other (See Comments)    GI Upset Severe panic attacks GI Upset  . Propoxyphene N-Acetaminophen Other (See Comments)    Severe panic attacks  . Levofloxacin Anxiety and Other (See Comments)    Causes panic attacks.    Past Medical History:  Diagnosis Date  . Anxiety    controlled with meds  . Anxiety   . Arrhythmia   . Atrial fibrillation ([name])    paroxysmal, failed medical therapy with flecainide,  NSVT with tikosyn  . Bruises easily   . CVA (cerebral vascular accident) ([place]) 12/2007  . Depression    medically controlled   . Low blood pressure    no falls, but if gets up to fast  . Stroke ([place]) 7 years ago   Lasting effects on balance, different personality.  . Thyroid disease   . 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 Multiple sclerosis Father   . Breast cancer Neg Hx     Social History   Socioeconomic History  . Marital status: Widowed    Spouse name: Not on file  . Number of children: 0  . Years of education: Not on file  . Highest education level: Not on file  Occupational History  . Occupation: Retired    Fish farm manager: RETIRED  Social Needs  . Financial resource strain: Not on file  . Food insecurity:    Worry: Not on file    Inability: Not on file  . Transportation needs:    Medical: Not on file    Non-medical: Not on file  Tobacco Use  . Smoking status: Never Smoker  . Smokeless tobacco: Never Used  Substance and Sexual Activity  . Alcohol use: Yes    Alcohol/week: 1.0 standard drinks    Types: 1 Standard drinks or equivalent per week    Comment: regular  . Drug use: No  . Sexual activity: Never  Lifestyle  . Physical activity:    Days per week: Not on file    Minutes per session: Not on file  . Stress: Not on file  Relationships  . Social connections:    Talks on phone: Not on file    Gets together: Not on file    Attends religious service: Not  on file    Active member of club or organization: Not on file    Attends meetings of clubs or organizations: Not on file    Relationship status: Not on file  . Intimate partner violence:    Fear of current or ex partner: Not on file    Emotionally abused: Not on file    Physically abused: Not on file    Forced sexual activity: Not on file  Other Topics Concern  . Not on  file  Social History Narrative   Lives in [place] with her husband.  No children.  Former Psychologist, prison and probation services   Enjoys exercise- water classes, yoga Editor, commissioning.       Has a living will-    Would desire CPR   Would not want prolonged life support if futile.    Past Medical History, Surgical history, Social history, and Family history were reviewed and updated as appropriate.   Please see review of systems for further details on the patient's review from today.   Objective:   Physical Exam:  There were no vitals taken for this visit.  Physical Exam  Constitutional: She is oriented to person, place, and time. She appears well-developed. No distress.  Musculoskeletal: She exhibits no deformity.  Neurological: She is alert and oriented to person, place, and time. She displays no tremor. Coordination and gait normal.  Psychiatric: She has a normal mood and affect. Her speech is normal and behavior is normal. Judgment and thought content normal. Her mood appears not anxious. Her affect is not angry, not blunt, not labile and not inappropriate. Cognition and memory are normal. She does not exhibit a depressed mood. She expresses no homicidal and no suicidal ideation. She expresses no suicidal plans and no homicidal plans.  Insight and judgment good.  Less guilt through grief counseling. No auditory or visual hallucinations. No delusions.  She is attentive.  Much more open and outspoken.  Lab Review:     Component Value Date/Time   NA 134 (L) 05/10/2018 0934   NA 139 08/20/2017 1619   NA 136 11/07/2014 1539    K 4.2 05/10/2018 0934   K 4.3 11/07/2014 1539   CL 98 05/10/2018 0934   CL 100 (L) 11/07/2014 1539   CO2 30 05/10/2018 0934   CO2 28 11/07/2014 1539   GLUCOSE 83 05/10/2018 0934   GLUCOSE 107 (H) 11/07/2014 1539   BUN 24 (H) 05/10/2018 0934   BUN 17 08/20/2017 1619   BUN 22 (H) 11/07/2014 1539   CREATININE 0.69 05/10/2018 0934   CREATININE 0.60 11/07/2014 1539   CALCIUM 9.4 05/10/2018 0934   CALCIUM 9.2 11/07/2014 1539   PROT 7.0 05/10/2018 0934   ALBUMIN 4.1 05/10/2018 0934   AST 15 05/10/2018 0934   ALT 11 05/10/2018 0934   ALKPHOS 44 05/10/2018 0934   BILITOT 0.5 05/10/2018 0934   GFRNONAA 84 08/20/2017 1619   GFRNONAA >60 11/07/2014 1539   GFRAA 96 08/20/2017 1619   GFRAA >60 11/07/2014 1539       Component Value Date/Time   WBC 5.2 05/10/2018 0934   RBC 4.52 05/10/2018 0934   HGB 13.8 05/10/2018 0934   HGB 13.0 08/20/2017 1619   HCT 41.7 05/10/2018 0934   HCT 38.1 08/20/2017 1619   PLT 300.0 05/10/2018 0934   PLT 286 08/20/2017 1619   MCV 92.3 05/10/2018 0934   MCV 89 08/20/2017 1619   MCV 92 11/07/2014 1539   MCH 30.2 08/20/2017 1619   MCH 30.0 11/07/2014 1539   MCH 30.3 04/13/2011 0500   MCHC 33.1 05/10/2018 0934   RDW 14.3 05/10/2018 0934   RDW 14.1 08/20/2017 1619   RDW 13.9 11/07/2014 1539   LYMPHSABS 1.7 11/19/2016 0852   MONOABS 0.6 11/19/2016 0852   EOSABS 0.2 11/19/2016 0852   BASOSABS 0.0 11/19/2016 0852    No results found for: POCLITH, LITHIUM   No results found for: PHENYTOIN, PHENOBARB, VALPROATE, CBMZ   .res Assessment: Plan:    Major  depressive disorder, recurrent episode, moderate ([name])  Generalized anxiety disorder  Attention deficit hyperactivity disorder (ADHD), other type - Plan: dextroamphetamine (DEXEDRINE SPANSULE) 5 MG 24 hr capsule acquired after stroke.  Benefit Dexedrine XR.  She is markedly better than when last seen.  Can't afford Rexulti, so never started it.  Continue duloxetine and no med changes.  Forgets occ.   Disc withdrawal.  Cont dexadrine DT benefit.  She's not sure if she's taking pramipexole (Mirapex) or not.  I want her to continue whichever is the current truth.  Grief work discussed.  This appointment was 15 minutes  FU 4 mos  Lynder Parents, MD, DFAPA   Please see After Visit Summary for patient specific instructions.  Future Appointments  Date Time Provider [place]  07/25/2018  1:40 PM [facility]  09/22/2018  9:30 AM Allred, Jeneen Rinks, MD [facility]    No orders of the defined types were placed in this encounter.     -------------------------------

## 2018-07-25 ENCOUNTER — Ambulatory Visit
Admission: RE | Admit: 2018-07-25 | Discharge: 2018-07-25 | Disposition: A | Discharge status: Home / Self Care | Payer: Medicare Other | Source: Ambulatory Visit | Attending: Internal Medicine | Admitting: Internal Medicine

## 2018-07-25 DIAGNOSIS — Z1231 Encounter for screening mammogram for malignant neoplasm of breast: Secondary | ICD-10-CM | POA: Diagnosis not present

## 2018-08-04 ENCOUNTER — Other Ambulatory Visit (HOSPITAL_COMMUNITY): Payer: Self-pay | Admitting: *Deleted

## 2018-08-04 MED ORDER — DILTIAZEM HCL ER COATED BEADS 240 MG PO CP24: 240.0000 mg | ORAL_CAPSULE | Freq: Every day | ORAL | 1 refills | Status: DC

## 2018-08-29 ENCOUNTER — Other Ambulatory Visit: Payer: Self-pay | Admitting: Psychiatry

## 2018-08-29 NOTE — Telephone Encounter (Signed)
Need to clarify dosage

## 2018-08-30 ENCOUNTER — Other Ambulatory Visit: Payer: Self-pay | Admitting: Psychiatry

## 2018-09-22 ENCOUNTER — Other Ambulatory Visit: Payer: Self-pay | Admitting: Psychiatry

## 2018-09-22 ENCOUNTER — Ambulatory Visit: Payer: Medicare Other | Admitting: Internal Medicine

## 2018-09-22 NOTE — Telephone Encounter (Signed)
Need to verify correct dosage

## 2018-10-04 ENCOUNTER — Other Ambulatory Visit: Payer: Self-pay | Admitting: Psychiatry

## 2018-10-24 ENCOUNTER — Telehealth: Payer: Self-pay | Admitting: Internal Medicine

## 2018-10-24 NOTE — Telephone Encounter (Signed)
Pt wants to know if she can have the required testing done at the [place] office. She doesn't want to come to [facility] if not necessary.

## 2018-10-25 NOTE — Telephone Encounter (Signed)
See mychart.  

## 2018-10-31 ENCOUNTER — Ambulatory Visit: Payer: Medicare Other | Admitting: Internal Medicine

## 2018-11-12 ENCOUNTER — Other Ambulatory Visit: Payer: Self-pay | Admitting: Internal Medicine

## 2018-11-13 ENCOUNTER — Encounter: Payer: Self-pay | Admitting: Internal Medicine

## 2018-11-15 ENCOUNTER — Encounter: Payer: Self-pay | Admitting: Psychiatry

## 2018-11-15 ENCOUNTER — Other Ambulatory Visit: Payer: Self-pay

## 2018-11-15 ENCOUNTER — Ambulatory Visit (INDEPENDENT_AMBULATORY_CARE_PROVIDER_SITE_OTHER): Payer: Medicare Other | Admitting: Psychiatry

## 2018-11-15 DIAGNOSIS — F331 Major depressive disorder, recurrent, moderate: Secondary | ICD-10-CM | POA: Diagnosis not present

## 2018-11-15 DIAGNOSIS — F411 Generalized anxiety disorder: Secondary | ICD-10-CM

## 2018-11-15 DIAGNOSIS — F908 Attention-deficit hyperactivity disorder, other type: Secondary | ICD-10-CM | POA: Diagnosis not present

## 2018-11-15 MED ORDER — DULOXETINE HCL 30 MG PO CPEP: ORAL_CAPSULE | ORAL | 1 refills | Status: DC

## 2018-11-15 MED ORDER — DEXTROAMPHETAMINE SULFATE ER 5 MG PO CP24: 5.0000 mg | ORAL_CAPSULE | Freq: Every day | ORAL | 0 refills | Status: DC

## 2018-11-15 MED ORDER — DULOXETINE HCL 60 MG PO CPEP: 60.0000 mg | ORAL_CAPSULE | Freq: Every day | ORAL | 1 refills | Status: DC

## 2018-11-15 NOTE — Progress Notes (Signed)
Monica Whitaker [id number] August 22, 1945 73 y.o.  Subjective:   Patient ID:  Monica Whitaker is a 73 y.o. (DOB [birth year]) female.  Chief Complaint:  Chief Complaint  Patient presents with  . Follow-up    Medication Managment  . Medication Refill    **Duloxetine 60 Mg and Dextro/Amphet 5 Mg**    Monica Whitaker presents today for follow-up of worsening depression and anxiety over care of dying and now deceased husband.   Last seen 2018/08/05.  No med changes were made at that visit.  Counseling helped a lot and she feels much better at this time.  Much more functional and productive.  More outgoing.  After 5 months depression lifted after [name] died.  No deaths from Covid.   Mood is a lot better.  Sleep is OK.  2 one mile walks daily.  Had party this weekend.    Patient reports stable mood and denies depressed or irritable moods.  Patient denies any recent difficulty with anxiety.  Patient denies difficulty with sleep initiation or maintenance. Denies appetite disturbance.  Patient reports that energy and motivation have been good.  Patient denies any difficulty with concentration.  Patient denies any suicidal ideation.  Hospice counselor Earnest Bailey was excellent and very helpful.  Review of Systems:  Review of Systems  Neurological: Negative for tremors and weakness.  Psychiatric/Behavioral: Negative for agitation, behavioral problems, confusion, decreased concentration, dysphoric mood, hallucinations, self-injury, sleep disturbance and suicidal ideas. The patient is not nervous/anxious and is not hyperactive.     Medications: I have reviewed the patient's current medications.  Current Outpatient Medications  Medication Sig Dispense Refill  . acetaminophen (TYLENOL) 500 MG tablet Take 2 tablets by mouth every 6 hours as needed for pain    . calcium carbonate (OS-CAL) 600 MG TABS Take 600 mg by mouth daily with breakfast.     . cholecalciferol (VITAMIN D) 1000 UNITS tablet Take  2,000 Units by mouth daily.     Marland Kitchen denosumab (PROLIA) 60 MG/ML SOLN injection Inject into the skin.    Marland Kitchen dextroamphetamine (DEXEDRINE SPANSULE) 5 MG 24 hr capsule Take 1 capsule (5 mg total) by mouth daily. 90 capsule 0  . diltiazem (CARDIZEM CD) 240 MG 24 hr capsule Take 1 capsule (240 mg total) by mouth daily. May take extra capsule daily as needed for afib 95 capsule 1  . diltiazem (CARDIZEM) 30 MG tablet Ttake 1 tablet every 4 hours AS NEEDED for rapid afib heart rate over 100 45 tablet 1  . DULoxetine (CYMBALTA) 30 MG capsule TAKE 1 CAPSULE BY MOUTH EVERY DAY 90 capsule 1  . DULoxetine (CYMBALTA) 60 MG capsule Take 60 mg by mouth daily. Take with 30 mg duloxetine    . ELIQUIS 5 MG TABS tablet TAKE ONE TABLET BY MOUTH TWICE DAILY 180 tablet 1  . estradiol (ESTRACE VAGINAL) 0.1 MG/GM vaginal cream Apply 0.5mg  (pea-sized amount)  just inside the vaginal introitus with a finger-tip on Monday, Wednesday and Friday nights. 30 g 12  . flecainide (TAMBOCOR) 100 MG tablet Take 1 tablet (100 mg total) by mouth 2 (two) times daily. 180 tablet 1  . furosemide (LASIX) 20 MG tablet Take 0.5 tablets (10 mg total) by mouth as needed for edema. 10 tablet 3  . levothyroxine (SYNTHROID, LEVOTHROID) 25 MCG tablet TAKE 1 TABLET BY MOUTH EVERY DAY 90 tablet 0  . Multiple Vitamin (MULTIVITAMIN) tablet Take 1 tablet by mouth daily.      . Oral Electrolytes (BUFFERED  SALT PO) Take 1 capsule by mouth daily as needed (Electrolyte/Salt supplement).     . pramipexole (MIRAPEX) 0.125 MG tablet TAKE 2 TABLETS BY MOUTH EVERY MORNING 180 tablet 0  . Timolol Maleate (TIMOPTIC OP) Place 1 drop into the left eye daily.     . vitamin B-12 (CYANOCOBALAMIN) 1000 MCG tablet Take 1,000 mcg by mouth daily.    . vitamin C (ASCORBIC ACID) 500 MG tablet Take 500 mg by mouth daily.    . vitamin E 400 UNIT capsule Take 400 Units by mouth daily.       No current facility-administered medications for this visit.     Medication Side  Effects: None  Allergies:  Allergies  Allergen Reactions  . Darvon Other (See Comments)    Severe panic attacks  . Propoxyphene Other (See Comments)    GI Upset Severe panic attacks GI Upset  . Propoxyphene N-Acetaminophen Other (See Comments)    Severe panic attacks  . Levofloxacin Anxiety and Other (See Comments)    Causes panic attacks.    Past Medical History:  Diagnosis Date  . Anxiety    controlled with meds  . Anxiety   . Arrhythmia   . Atrial fibrillation ([name])    paroxysmal, failed medical therapy with flecainide,  NSVT with tikosyn  . Bruises easily   . CVA (cerebral vascular accident) ([place]) 12/2007  . Depression    medically controlled   . Low blood pressure    no falls, but if gets up to fast  . Stroke ([place]) 7 years ago   Lasting effects on balance, different personality.  . Thyroid disease   . 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 Multiple sclerosis Father   . Breast cancer Neg Hx     Social History   Socioeconomic History  . Marital status: Widowed    Spouse name: Not on file  . Number of children: 0  . Years of education: Not on file  . Highest education level: Not on file  Occupational History  . Occupation: Retired    Fish farm manager: RETIRED  Social Needs  . Financial resource strain: Not on file  . Food insecurity:    Worry: Not on file    Inability: Not on file  . Transportation needs:    Medical: Not on file    Non-medical: Not on file  Tobacco Use  . Smoking status: Never Smoker  . Smokeless tobacco: Never Used  Substance and Sexual Activity  . Alcohol use: Yes    Alcohol/week: 1.0 standard drinks    Types: 1 Standard drinks or equivalent per week    Comment: regular  . Drug use: No  . Sexual activity: Never  Lifestyle  . Physical activity:    Days per week: Not on file    Minutes per session: Not on file  . Stress: Not on file  Relationships   . Social connections:    Talks on phone: Not on file    Gets together: Not on file    Attends religious service: Not on file    Active member of club or organization: Not on file    Attends meetings of clubs or organizations: Not on file    Relationship status: Not on file  . Intimate partner violence:    Fear of current or ex partner: Not on file    Emotionally abused:  Not on file    Physically abused: Not on file    Forced sexual activity: Not on file  Other Topics Concern  . Not on file  Social History Narrative   Lives in [place] with her husband.  No children.  Former Psychologist, prison and probation services   Enjoys exercise- water classes, yoga Editor, commissioning.       Has a living will-    Would desire CPR   Would not want prolonged life support if futile.    Past Medical History, Surgical history, Social history, and Family history were reviewed and updated as appropriate.   Please see review of systems for further details on the patient's review from today.   Objective:   Physical Exam:  There were no vitals taken for this visit.  Physical Exam Constitutional:      General: She is not in acute distress.    Appearance: She is well-developed.  Musculoskeletal:        General: No deformity.  Neurological:     Mental Status: She is alert and oriented to person, place, and time.     Motor: No tremor.     Coordination: Coordination normal.     Gait: Gait normal.  Psychiatric:        Attention and Perception: She is attentive.        Mood and Affect: Mood is not anxious or depressed. Affect is not labile, blunt, angry or inappropriate.        Speech: Speech normal.        Behavior: Behavior normal.        Thought Content: Thought content normal. Thought content does not include homicidal or suicidal ideation. Thought content does not include homicidal or suicidal plan.        Judgment: Judgment normal.     Comments: Insight and judgment good.  Less guilt through grief counseling. No  auditory or visual hallucinations. No delusions.    Much more open and outspoken.  Lab Review:     Component Value Date/Time   NA 134 (L) 05/10/2018 0934   NA 139 08/20/2017 1619   NA 136 11/07/2014 1539   K 4.2 05/10/2018 0934   K 4.3 11/07/2014 1539   CL 98 05/10/2018 0934   CL 100 (L) 11/07/2014 1539   CO2 30 05/10/2018 0934   CO2 28 11/07/2014 1539   GLUCOSE 83 05/10/2018 0934   GLUCOSE 107 (H) 11/07/2014 1539   BUN 24 (H) 05/10/2018 0934   BUN 17 08/20/2017 1619   BUN 22 (H) 11/07/2014 1539   CREATININE 0.69 05/10/2018 0934   CREATININE 0.60 11/07/2014 1539   CALCIUM 9.4 05/10/2018 0934   CALCIUM 9.2 11/07/2014 1539   PROT 7.0 05/10/2018 0934   ALBUMIN 4.1 05/10/2018 0934   AST 15 05/10/2018 0934   ALT 11 05/10/2018 0934   ALKPHOS 44 05/10/2018 0934   BILITOT 0.5 05/10/2018 0934   GFRNONAA 84 08/20/2017 1619   GFRNONAA >60 11/07/2014 1539   GFRAA 96 08/20/2017 1619   GFRAA >60 11/07/2014 1539       Component Value Date/Time   WBC 5.2 05/10/2018 0934   RBC 4.52 05/10/2018 0934   HGB 13.8 05/10/2018 0934   HGB 13.0 08/20/2017 1619   HCT 41.7 05/10/2018 0934   HCT 38.1 08/20/2017 1619   PLT 300.0 05/10/2018 0934   PLT 286 08/20/2017 1619   MCV 92.3 05/10/2018 0934   MCV 89 08/20/2017 1619   MCV 92 11/07/2014 1539   MCH  30.2 08/20/2017 1619   MCH 30.0 11/07/2014 1539   MCH 30.3 04/13/2011 0500   MCHC 33.1 05/10/2018 0934   RDW 14.3 05/10/2018 0934   RDW 14.1 08/20/2017 1619   RDW 13.9 11/07/2014 1539   LYMPHSABS 1.7 11/19/2016 0852   MONOABS 0.6 11/19/2016 0852   EOSABS 0.2 11/19/2016 0852   BASOSABS 0.0 11/19/2016 0852    No results found for: POCLITH, LITHIUM   No results found for: PHENYTOIN, PHENOBARB, VALPROATE, CBMZ   .res Assessment: Plan:    Major depressive disorder, recurrent episode, moderate ([name])  Attention deficit hyperactivity disorder (ADHD), other type - Plan: dextroamphetamine (DEXEDRINE SPANSULE) 5 MG 24 hr  capsule  Generalized anxiety disorder acquired after stroke.  Benefit Dexedrine XR.  She is markedly better than when last seen.  Can't afford Rexulti, so never started it.  Continue duloxetine and no med changes.  Forgets occ.  Disc withdrawal.  Cont dexadrine DT benefit.  She's not sure if she's taking pramipexole (Mirapex) or not.  I want her to continue whichever is the current truth.  Grief work discussed.  No med changes indicated.  This appointment was 15 minutes  FU 4 mos  I connected with patient by a video enabled telemedicine application or telephone, with their informed consent, and verified patient privacy and that I am speaking with the correct person using two identifiers.  I was located at office and patient at home.  Lynder Parents, MD, DFAPA   Please see After Visit Summary for patient specific instructions.  No future appointments.  No orders of the defined types were placed in this encounter.     -------------------------------

## 2018-11-18 ENCOUNTER — Other Ambulatory Visit: Payer: Self-pay | Admitting: Internal Medicine

## 2018-11-18 NOTE — Telephone Encounter (Signed)
Please contact her to see if there is any way to set up a virtual visit (is she going into Mrs Motafches house--if so, see if we can set up video visit)

## 2018-11-21 NOTE — Telephone Encounter (Signed)
Please call Roselyn or Evelyne about an appt tomorrow I have a 10:15 so I can do 9:45 or 10:45---whichever is better

## 2018-12-12 ENCOUNTER — Other Ambulatory Visit: Payer: Self-pay | Admitting: *Deleted

## 2018-12-12 MED ORDER — APIXABAN 5 MG PO TABS: 5.0000 mg | ORAL_TABLET | Freq: Two times a day (BID) | ORAL | 1 refills | Status: DC

## 2018-12-12 NOTE — Telephone Encounter (Signed)
Pt called the office requesting refill for Eliquis 5 mg. [facility] [address]

## 2018-12-12 NOTE — Telephone Encounter (Signed)
Last OV 06/07/18 Scr 0.69 on 05/10/18 73 years old  14kg Eliquis 5mg  BID sent to pharmacy

## 2018-12-21 ENCOUNTER — Ambulatory Visit (INDEPENDENT_AMBULATORY_CARE_PROVIDER_SITE_OTHER): Payer: Medicare Other

## 2018-12-21 DIAGNOSIS — M810 Age-related osteoporosis without current pathological fracture: Secondary | ICD-10-CM

## 2018-12-21 MED ORDER — DENOSUMAB 60 MG/ML ~~LOC~~ SOSY
60.0000 mg | PREFILLED_SYRINGE | Freq: Once | SUBCUTANEOUS | Status: AC
  Administered 2018-12-21: 60 mg via SUBCUTANEOUS

## 2018-12-21 NOTE — Progress Notes (Signed)
Per orders of Webb Silversmith, NP, injection of Prolia (denosumab) given by Diamond Nickel, RN.  Administered subcutaneous to left arm.   Patient tolerated injection well.  Patient informed me only after administering injection that she was having a problem with a possible infected tooth.  She will be seeing a Theatre stage manager.  I informed her of risks with prolia and invasive dental procedures and that she should be sure she informs her dentist that she received a prolia injection today.  She made a note and will make him aware.

## 2018-12-27 ENCOUNTER — Other Ambulatory Visit: Payer: Self-pay | Admitting: Psychiatry

## 2019-01-12 ENCOUNTER — Telehealth: Payer: Self-pay | Admitting: Psychiatry

## 2019-01-12 NOTE — Telephone Encounter (Signed)
Pt said that she needs a PA done for Dextroamphetamine. You can call  [phone]. It will save her $200.  Thanks!

## 2019-01-12 NOTE — Telephone Encounter (Signed)
Called pharmacy and they stated her [facility] Medicare Part D insurance covers her medication but it is expensive. $250.00 for 90 day supply. Pharmacist suggest she may have a high deductible. Tried to reach [facility] to discuss options but unable to speak to representative will try back, [phone].

## 2019-01-13 NOTE — Telephone Encounter (Signed)
Spoke with representative with [facility] and submitted a tier exception, PA has already been approved but pt requesting to get cost down. Answered all the questions and past medications tried. Will notify us via fax

## 2019-01-13 NOTE — Telephone Encounter (Signed)
[place] Ref #[id number]

## 2019-01-16 ENCOUNTER — Other Ambulatory Visit (HOSPITAL_COMMUNITY): Payer: Self-pay | Admitting: Nurse Practitioner

## 2019-01-16 NOTE — Telephone Encounter (Signed)
Please get this chart to my box

## 2019-01-23 ENCOUNTER — Other Ambulatory Visit: Payer: Self-pay | Admitting: Psychiatry

## 2019-01-23 ENCOUNTER — Telehealth: Payer: Self-pay | Admitting: Psychiatry

## 2019-01-23 DIAGNOSIS — F908 Attention-deficit hyperactivity disorder, other type: Secondary | ICD-10-CM

## 2019-01-23 MED ORDER — AMPHETAMINE-DEXTROAMPHET ER 10 MG PO CP24: 10.0000 mg | ORAL_CAPSULE | Freq: Every day | ORAL | 0 refills | Status: DC

## 2019-01-23 NOTE — Telephone Encounter (Signed)
Please inform patient of the following  Insurance has raised Dexedrine Spansule to a high cost.  We will change that to Adderall XR 10 mg 1 every morning.  This should be more affordable.  Prescription sent in

## 2019-01-23 NOTE — Progress Notes (Signed)
Insurance has raised Dexedrine Spansule to a high cost.  We will change that to Adderall XR 10 mg 1 every morning.  This should be more affordable.  Prescription sent in

## 2019-01-23 NOTE — Telephone Encounter (Signed)
Patient stated the medication for Dextra was denied by insurance but there is several alternate forms of this medication that is covered by insurance at a Tier 3 copay.  Please call and advise.  A letter should have been sent on Friday

## 2019-02-04 ENCOUNTER — Other Ambulatory Visit: Payer: Self-pay | Admitting: Internal Medicine

## 2019-02-17 ENCOUNTER — Telehealth: Payer: Self-pay | Admitting: Psychiatry

## 2019-02-17 NOTE — Telephone Encounter (Signed)
Patient called and said that she is suppose to start a new medicine next week and she wants you to [facility] [address] [facility] [place]

## 2019-02-20 NOTE — Telephone Encounter (Signed)
Spoke with patient and assured her that she was switched from the Dexadrine to the Adderall. Since the generic names are similar she [facility] because she had thought it was the same medication she was previously on. I told her to call back with any questions.

## 2019-02-20 NOTE — Telephone Encounter (Signed)
Thank you for clarifying that for her.

## 2019-02-20 NOTE — Telephone Encounter (Signed)
Looks like rx was already submitted in June, should be on file at her pharmacy.

## 2019-02-25 ENCOUNTER — Other Ambulatory Visit: Payer: Self-pay | Admitting: Psychiatry

## 2019-03-07 ENCOUNTER — Telehealth: Payer: Self-pay | Admitting: Psychiatry

## 2019-03-07 NOTE — Telephone Encounter (Signed)
PT wants [place] to send her a 90 day rx for Adderrall xr 10mg . She has saved quite a bit of money by using [facility], but realizes she can save even more money by doing a 90 day at another pharmacy. She would like the RX printed and sent to her.

## 2019-03-08 ENCOUNTER — Other Ambulatory Visit: Payer: Self-pay

## 2019-03-08 ENCOUNTER — Telehealth: Payer: Self-pay | Admitting: Psychiatry

## 2019-03-08 DIAGNOSIS — F908 Attention-deficit hyperactivity disorder, other type: Secondary | ICD-10-CM

## 2019-03-08 MED ORDER — AMPHETAMINE-DEXTROAMPHET ER 10 MG PO CP24: 10.0000 mg | ORAL_CAPSULE | Freq: Every day | ORAL | 0 refills | Status: DC

## 2019-03-08 NOTE — Telephone Encounter (Signed)
Left voicemail to call back and discuss

## 2019-03-08 NOTE — Telephone Encounter (Signed)
Monica Whitaker stated the prescription can be called in at [facility] in [place] [phone] and the cost is $100. Stated you would know what this msg is about.

## 2019-03-08 NOTE — Telephone Encounter (Signed)
Eniyah will contact pharmacy to make sure they can fill for 90 day and will call back with information

## 2019-03-08 NOTE — Telephone Encounter (Signed)
Pt requesting 90 day be submitted to [facility]

## 2019-03-08 NOTE — Telephone Encounter (Signed)
See previous phone messages taken

## 2019-03-13 ENCOUNTER — Ambulatory Visit (INDEPENDENT_AMBULATORY_CARE_PROVIDER_SITE_OTHER): Payer: Medicare Other | Admitting: Family Medicine

## 2019-03-13 ENCOUNTER — Encounter: Payer: Self-pay | Admitting: Family Medicine

## 2019-03-13 ENCOUNTER — Other Ambulatory Visit: Payer: Self-pay

## 2019-03-13 VITALS — BP 100/70 | HR 68 | Temp 98.1°F | Ht 69.5 in | Wt 170.8 lb

## 2019-03-13 DIAGNOSIS — Z7902 Long term (current) use of antithrombotics/antiplatelets: Secondary | ICD-10-CM | POA: Diagnosis not present

## 2019-03-13 DIAGNOSIS — M549 Dorsalgia, unspecified: Secondary | ICD-10-CM | POA: Diagnosis not present

## 2019-03-13 MED ORDER — PREDNISONE 20 MG PO TABS: ORAL_TABLET | ORAL | 0 refills | Status: DC

## 2019-03-13 MED ORDER — TIZANIDINE HCL 2 MG PO TABS: 1.0000 mg | ORAL_TABLET | Freq: Every day | ORAL | 0 refills | Status: DC

## 2019-03-13 NOTE — Progress Notes (Signed)
Monica Salvaggio T. Javonte Elenes, MD Primary Care and [place] at [facility] [place], [address] Phone: [phone]  FAX: [phone]  Monica Whitaker - 73 y.o. female  MRN [id number]  Date of Birth: [birth year]  Visit Date: 03/13/2019  PCP: Jearld Fenton, NP  Referred by: Jearld Fenton, NP  Chief Complaint  Patient presents with  . Back Pain    X 5 days   Subjective:   Monica Whitaker is a 73 y.o. very pleasant female patient with Body mass index is 24.85 kg/m. who presents with the following:  5 days of back pain:  Heating pad, tylenol, no trauma.  No prior back pain.  Normally walks two miles a day and  Yoga.  Her back is been doing much worse than normal.  Baseline she is quite active.  Traditionally she does yoga very frequently and walks every single day.  Now she has become quite limited, and this is only for about 5 days.  She does take Eliquis daily, and this limits her ability to take NSAIDs.  Yoga 2 days.   Lowest part of her back.   Eliquis.   Past Medical History, Surgical History, Social History, Family History, Problem List, Medications, and Allergies have been reviewed and updated if relevant.  Patient Active Problem List   Diagnosis Date Noted  . Anxiety 06/27/2018  . Orthostatic hypotension 11/04/2017  . Hypothyroidism 11/04/2017  . OSA on CPAP 06/21/2017  . Persistent atrial fibrillation 06/21/2017  . Cerebrovascular accident (CVA) due to embolism of left middle cerebral artery ([place]) 03/04/2017  . Osteoporosis 12/26/2015  . ANXIETY DEPRESSION 04/10/2010    Past Medical History:  Diagnosis Date  . Anxiety    controlled with meds  . Anxiety   . Arrhythmia   . Atrial fibrillation ([name])    paroxysmal, failed medical therapy with flecainide,  NSVT with tikosyn  . Bruises easily   . CVA (cerebral vascular accident) ([place]) 12/2007  . Depression    medically controlled   . Low blood pressure     no falls, but if gets up to fast  . Stroke ([place]) 7 years ago   Lasting effects on balance, different personality.  . Thyroid disease   . Ventricular tachycardia ([place])    pt told at [facility] that she had RVOT VT    Past Surgical History:  Procedure Laterality Date  . ATRIAL FIBRILLATION ABLATION  10/18/12   PVI by Dr Rayann Heman  . ATRIAL FIBRILLATION ABLATION N/A 10/18/2012   Procedure: ATRIAL FIBRILLATION ABLATION;  Surgeon: Thompson Grayer, MD;  Location: [facility] CATH LAB;  Service: Cardiovascular;  Laterality: N/A;  . EYE SURGERY     on L/R eye, macular hole   . OPEN REDUCTION INTERNAL FIXATION (ORIF) DISTAL RADIAL FRACTURE Right 11/15/2014   Procedure: OPEN REDUCTION INTERNAL FIXATION (ORIF) RIGHT DISTAL RADIAL FRACTURE WITH ALLOGRAFT BONE GRAFT;  Surgeon: Roseanne Kaufman, MD;  Location: [place];  Service: Orthopedics;  Laterality: Right;  . TEE WITHOUT CARDIOVERSION N/A 10/18/2012   Procedure: TRANSESOPHAGEAL ECHOCARDIOGRAM (TEE);  Surgeon: Lelon Perla, MD;  Location: [facility] ENDOSCOPY;  Service: Cardiovascular;  Laterality: N/A;  Pre-Ablation at 12pm  . TONSILLECTOMY    . VAGINAL HYSTERECTOMY      Social History   Socioeconomic History  . Marital status: Widowed    Spouse name: Not on file  . Number of children: 0  . Years of education: Not on file  .  Highest education level: Not on file  Occupational History  . Occupation: Retired    Fish farm manager: RETIRED  Social Needs  . Financial resource strain: Not on file  . Food insecurity    Worry: Not on file    Inability: Not on file  . Transportation needs    Medical: Not on file    Non-medical: Not on file  Tobacco Use  . Smoking status: Never Smoker  . Smokeless tobacco: Never Used  Substance and Sexual Activity  . Alcohol use: Yes    Alcohol/week: 1.0 standard drinks    Types: 1 Standard drinks or equivalent per week    Comment: regular  . Drug use: No  . Sexual activity: Never  Lifestyle  . Physical activity     Days per week: Not on file    Minutes per session: Not on file  . Stress: Not on file  Relationships  . Social Herbalist on phone: Not on file    Gets together: Not on file    Attends religious service: Not on file    Active member of club or organization: Not on file    Attends meetings of clubs or organizations: Not on file    Relationship status: Not on file  . Intimate partner violence    Fear of current or ex partner: Not on file    Emotionally abused: Not on file    Physically abused: Not on file    Forced sexual activity: Not on file  Other Topics Concern  . Not on file  Social History Narrative   Lives in [place] with her husband.  No children.  Former Psychologist, prison and probation services   Enjoys exercise- water classes, yoga Editor, commissioning.       Has a living will-    Would desire CPR   Would not want prolonged life support if futile.    Family History  Problem Relation Age of Onset  . Multiple sclerosis Father   . Breast cancer Neg Hx     Allergies  Allergen Reactions  . Darvon Other (See Comments)    Severe panic attacks  . Propoxyphene Other (See Comments)    GI Upset Severe panic attacks GI Upset  . Propoxyphene N-Acetaminophen Other (See Comments)    Severe panic attacks  . Levofloxacin Anxiety and Other (See Comments)    Causes panic attacks.    Medication list reviewed and updated in full in [place].  GEN: No fevers, chills. Nontoxic. Primarily MSK c/o today. MSK: Detailed in the HPI GI: tolerating PO intake without difficulty Neuro: No numbness, parasthesias, or tingling associated. Otherwise the pertinent positives of the ROS are noted above.   Objective:   BP 100/70   Pulse 68   Temp 98.1 F (36.7 C) (Temporal)   Ht 5' 9.5" (1.765 m)   Wt 170 lb 12 oz (77.5 kg)   SpO2 92%   BMI 24.85 kg/m    GEN: Well-developed,well-nourished,in no acute distress; alert,appropriate and cooperative throughout examination HEENT: Normocephalic  and atraumatic without obvious abnormalities. Ears, externally no deformities PULM: Breathing comfortably in no respiratory distress EXT: No clubbing, cyanosis, or edema PSYCH: Normally interactive. Cooperative during the interview. Pleasant. Friendly and conversant. Not anxious or depressed appearing. Normal, full affect.  Range of motion at  the waist: Flexion, extension, lateral bending and rotation: Notable limitation with forward flexion and extension.  Lateral bending and rotation are relatively more normal compared.  No echymosis or edema Rises  to examination table with mild difficulty Gait: minimally antalgic  Inspection/Deformity: N Paraspinus Tenderness: l5-s1 b  B Ankle Dorsiflexion (L5,4): 5/5 B Great Toe Dorsiflexion (L5,4): 5/5 Heel Walk (L5): WNL Toe Walk (S1): WNL Rise/Squat (L4): WNL, mild pain  SENSORY B Medial Foot (L4): WNL B Dorsum (L5): WNL B Lateral (S1): WNL Light Touch: WNL Pinprick: WNL  REFLEXES Knee (L4): 2+ Ankle (S1): 2+  B SLR, seated: neg B SLR, supine: neg B FABER: neg B Reverse FABER: neg B Greater Troch: NT B Log Roll: neg B Stork: NT B Sciatic Notch: NT   Radiology: No results found.  Assessment and Plan:     ICD-10-CM   1. Acute back pain, unspecified back location, unspecified back pain laterality  M54.9   2. Antiplatelet or antithrombotic long-term use  Z79.02    Acute back pain in the setting of long-term Eliquis.  Neck is reasonable to go ahead and give the patient some steroids and low-dose Zanaflex.  As she is able, return to low-level yoga.  Follow-up: No follow-ups on file.  Meds ordered this encounter  Medications  . predniSONE (DELTASONE) 20 MG tablet    Sig: 2 tabs po daily for 5 days, then 1 tab po daily for 5 days    Dispense:  15 tablet    Refill:  0  . tiZANidine (ZANAFLEX) 2 MG tablet    Sig: Take 0.5-1 tablets (1-2 mg total) by mouth at bedtime.    Dispense:  30 tablet    Refill:  0   No orders of  the defined types were placed in this encounter.   Signed,  Maud Deed. Cadon Raczka, MD   Outpatient Encounter Medications as of 03/13/2019  Medication Sig  . acetaminophen (TYLENOL) 500 MG tablet Take 2 tablets by mouth every 6 hours as needed for pain  . [START ON 03/22/2019] amphetamine-dextroamphetamine (ADDERALL XR) 10 MG 24 hr capsule Take 1 capsule (10 mg total) by mouth daily.  Marland Kitchen apixaban (ELIQUIS) 5 MG TABS tablet Take 1 tablet (5 mg total) by mouth 2 (two) times daily.  . calcium carbonate (OS-CAL) 600 MG TABS Take 600 mg by mouth daily with breakfast.   . cholecalciferol (VITAMIN D) 1000 UNITS tablet Take 2,000 Units by mouth daily.   Marland Kitchen denosumab (PROLIA) 60 MG/ML SOLN injection Inject into the skin.  Marland Kitchen diltiazem (CARDIZEM CD) 240 MG 24 hr capsule TAKE 1 CAPSULE (240 MG TOTAL) BY MOUTH DAILY. MAY TAKE EXTRA CAPSULE DAILY AS NEEDED FOR AFIB  . diltiazem (CARDIZEM) 30 MG tablet Ttake 1 tablet every 4 hours AS NEEDED for rapid afib heart rate over 100  . DULoxetine (CYMBALTA) 30 MG capsule TAKE 1 CAPSULE BY MOUTH EVERY DAY along with 60 mg capsule  . DULoxetine (CYMBALTA) 60 MG capsule Take 1 capsule (60 mg total) by mouth daily. Take with 30 mg duloxetine  . estradiol (ESTRACE VAGINAL) 0.1 MG/GM vaginal cream Apply 0.5mg  (pea-sized amount)  just inside the vaginal introitus with a finger-tip on Monday, Wednesday and Friday nights.  . flecainide (TAMBOCOR) 100 MG tablet TAKE 1 TABLET BY MOUTH TWICE A DAY  . furosemide (LASIX) 20 MG tablet Take 0.5 tablets (10 mg total) by mouth as needed for edema.  Marland Kitchen levothyroxine (SYNTHROID) 25 MCG tablet TAKE 1 TABLET BY MOUTH EVERY DAY  . Multiple Vitamin (MULTIVITAMIN) tablet Take 1 tablet by mouth daily.    . Oral Electrolytes (BUFFERED SALT PO) Take 1 capsule by mouth daily as needed (Electrolyte/Salt supplement).   Marland Kitchen  pramipexole (MIRAPEX) 0.125 MG tablet TAKE 2 TABLETS BY MOUTH EVERY MORNING  . Timolol Maleate (TIMOPTIC OP) Place 1 drop into the  left eye daily.   . vitamin B-12 (CYANOCOBALAMIN) 1000 MCG tablet Take 1,000 mcg by mouth daily.  . vitamin C (ASCORBIC ACID) 500 MG tablet Take 500 mg by mouth daily.  . vitamin E 400 UNIT capsule Take 400 Units by mouth daily.    . predniSONE (DELTASONE) 20 MG tablet 2 tabs po daily for 5 days, then 1 tab po daily for 5 days  . tiZANidine (ZANAFLEX) 2 MG tablet Take 0.5-1 tablets (1-2 mg total) by mouth at bedtime.   No facility-administered encounter medications on file as of 03/13/2019.

## 2019-03-14 ENCOUNTER — Telehealth: Payer: Self-pay

## 2019-03-14 NOTE — Telephone Encounter (Signed)
Pt saw Dr Lorelei Pont on 03/13/19 and was prescribed prednisone for back pain; pt said she has taken prednisone before and remembered having side effects; the only side effect pt could remember was could not sleep at night. Pt could not remember what time of day she took the prednisone previously and could not remember any other side effects when she took the prednisone. I advised pt should take prednisone early AM so that hopefully it will not effect her sleep. Pt said she had terrible back pain this morning and will take 4 of the prednisone as prescribed. I advised pt she should only take 2 tabs for 5 days and then 1 tab for 5 days. Pt got the bottle and pt was given prednisone 10 mg taking 4 tabs po for 5 days and 2 tabs po for 5 days. Evidently [facility] did not have the 20 mg in stock and substituted the 10 mg. Pt will cb with any side effects or questions and pt is going to take the med now when she eats breakfast. Pt will take med same time each day with food. Pt said she was reassured and nothing further needed at this time. FYI to Dr Lorelei Pont.

## 2019-03-17 MED ORDER — CYCLOBENZAPRINE HCL 10 MG PO TABS: 5.0000 mg | ORAL_TABLET | Freq: Every evening | ORAL | 0 refills | Status: DC | PRN

## 2019-03-17 NOTE — Telephone Encounter (Signed)
D/c tizanidine  I sent in some cyclobenzaprine instead.  I would start with 1/2 a tablet, but if that is not enough increase to a full tablet the following night

## 2019-03-17 NOTE — Telephone Encounter (Signed)
Pt is also wondering if she can be prescribed something to help with sleep during this time she is taking prednisone

## 2019-03-17 NOTE — Telephone Encounter (Signed)
Left message for Monica Whitaker to stop the tizanidine and start cyclobenzaprine instead.  Advised to start with 1/2 a tablet  tonight, but if that is not enough, increase to a full tablet tomorrow night per Dr. Lorelei Pont.

## 2019-03-17 NOTE — Addendum Note (Signed)
Addended by: Owens Loffler on: 03/17/2019 09:00 AM   Modules accepted: Orders

## 2019-03-27 ENCOUNTER — Telehealth: Payer: Self-pay

## 2019-03-27 ENCOUNTER — Telehealth: Payer: Self-pay | Admitting: *Deleted

## 2019-03-27 NOTE — Telephone Encounter (Signed)
Monica Whitaker notified as instructed by telephone.  She states she has a lot going on.  She has been running low grade fever and doesn't feel well.  She was  recently  tested for Covid and is awaiting those results.  She also went into A-Fib so she is waiting to get an E-Visit set up with her cardiologist.  She ask if she could call me back next week to discuss Dr. Lillie Fragmin recommendations.  I advised her to call me back when she was ready to discuss her options.

## 2019-03-27 NOTE — Telephone Encounter (Signed)
Call.  Truthfully they are not that many great options.  She has MRI proven extensive degenerative disc disease as well as spondyloarthropathy throughout the entirety of her lower back.  This is from her MRI in 2018.  Options that I would consider. . 1. We can bring her back in the office, obtain some lumbar spine films and repeat her MRI of her back.  2.  I could also refer her to physical medicine and rehab and consider potentially some intervention such as epidural steroid injections that they could do for her.

## 2019-03-27 NOTE — Telephone Encounter (Signed)
Patient called stating that she as seen in the office two weeks ago and put on Prednisone. Patient stated that she has finished the prednisone and there has not been any improvement since finishing the medication. Patient wants to know what the next step would be? [facility] if medication needs to be sent in. Patient stated that she can not leave the campus of [facility] for several days.

## 2019-03-29 ENCOUNTER — Other Ambulatory Visit: Payer: Self-pay

## 2019-03-29 ENCOUNTER — Ambulatory Visit (INDEPENDENT_AMBULATORY_CARE_PROVIDER_SITE_OTHER): Payer: Medicare Other | Admitting: Internal Medicine

## 2019-03-29 ENCOUNTER — Encounter: Payer: Self-pay | Admitting: Internal Medicine

## 2019-03-29 VITALS — BP 102/62 | HR 52 | Temp 98.1°F | Wt 173.0 lb

## 2019-03-29 DIAGNOSIS — G44209 Tension-type headache, unspecified, not intractable: Secondary | ICD-10-CM

## 2019-03-29 DIAGNOSIS — M542 Cervicalgia: Secondary | ICD-10-CM | POA: Diagnosis not present

## 2019-03-29 DIAGNOSIS — R350 Frequency of micturition: Secondary | ICD-10-CM | POA: Diagnosis not present

## 2019-03-29 LAB — POC URINALSYSI DIPSTICK (AUTOMATED)
Bilirubin, UA: NEGATIVE
Blood, UA: NEGATIVE
Glucose, UA: NEGATIVE
Leukocytes, UA: NEGATIVE
Nitrite, UA: NEGATIVE
Protein, UA: POSITIVE — AB
Spec Grav, UA: 1.015 (ref 1.010–1.025)
Urobilinogen, UA: 0.2 E.U./dL
pH, UA: 6 (ref 5.0–8.0)

## 2019-03-29 NOTE — Progress Notes (Signed)
HPI  Pt presents to the clinic today with c/o urinary frequency. This started 4 days ago. She denies urgency, dysuria or blood in her urine. She denies fever, chills, nausea or low back pain. She has not taken anything OTC for this. She denies vaginal complaints.  She also c/o right sided headache and right sided neck pain. She reports this started 1 week ago. She describes the pain as aching. She denies visual changes but has had some dizziness. She denies runny nose, nasal congestion, ear pain, sore throat or cough. She denies any injury to the area. She has seen an eye doctor in the last year, and reports her RX has not changed. She has not taken anything OTC for this.  Review of Systems  Past Medical History:  Diagnosis Date  . Anxiety    controlled with meds  . Anxiety   . Arrhythmia   . Atrial fibrillation ([name])    paroxysmal, failed medical therapy with flecainide,  NSVT with tikosyn  . Bruises easily   . CVA (cerebral vascular accident) ([place]) 12/2007  . Depression    medically controlled   . Low blood pressure    no falls, but if gets up to fast  . Stroke ([place]) 7 years ago   Lasting effects on balance, different personality.  . Thyroid disease   . Ventricular tachycardia ([place])    pt told at [facility] that she had RVOT VT    Family History  Problem Relation Age of Onset  . Multiple sclerosis Father   . Breast cancer Neg Hx     Social History   Socioeconomic History  . Marital status: Widowed    Spouse name: Not on file  . Number of children: 0  . Years of education: Not on file  . Highest education level: Not on file  Occupational History  . Occupation: Retired    Fish farm manager: RETIRED  Social Needs  . Financial resource strain: Not on file  . Food insecurity    Worry: Not on file    Inability: Not on file  . Transportation needs    Medical: Not on file    Non-medical: Not on file  Tobacco Use  . Smoking status: Never Smoker  . Smokeless tobacco: Never Used   Substance and Sexual Activity  . Alcohol use: Yes    Alcohol/week: 1.0 standard drinks    Types: 1 Standard drinks or equivalent per week    Comment: regular  . Drug use: No  . Sexual activity: Never  Lifestyle  . Physical activity    Days per week: Not on file    Minutes per session: Not on file  . Stress: Not on file  Relationships  . Social Herbalist on phone: Not on file    Gets together: Not on file    Attends religious service: Not on file    Active member of club or organization: Not on file    Attends meetings of clubs or organizations: Not on file    Relationship status: Not on file  . Intimate partner violence    Fear of current or ex partner: Not on file    Emotionally abused: Not on file    Physically abused: Not on file    Forced sexual activity: Not on file  Other Topics Concern  . Not on file  Social History Narrative   Lives in [place] with her husband.  No children.  Former Psychologist, prison and probation services   Enjoys  exercise- water classes, yoga strength training.       Has a living will-    Would desire CPR   Would not want prolonged life support if futile.    Allergies  Allergen Reactions  . Darvon Other (See Comments)    Severe panic attacks  . Propoxyphene Other (See Comments)    GI Upset Severe panic attacks GI Upset  . Propoxyphene N-Acetaminophen Other (See Comments)    Severe panic attacks  . Levofloxacin Anxiety and Other (See Comments)    Causes panic attacks.     Constitutional: Pt reports headache. Denies fever, malaise, fatigue, or abrupt weight changes.   GU: Pt reports frequency. Denies urgency, dysuria, burning sensation, blood in urine, odor or discharge. Skin: Denies redness, rashes, lesions or ulcercations.  MSK: Pt reports right side neck pain. Denies decrease in ROM, joint swelling or pain. Neuro: Pt report dizziness. Denies difficulty with speech, balance or coordination.  No other specific complaints in a complete review of  systems (except as listed in HPI above).    Objective:   Physical Exam BP 102/62   Pulse (!) 52   Temp 98.1 F (36.7 C) (Temporal)   Wt 173 lb (78.5 kg)   SpO2 96%   BMI 25.18 kg/m   Wt Readings from Last 3 Encounters:  03/13/19 170 lb 12 oz (77.5 kg)  06/22/18 167 lb 8 oz (76 kg)  06/10/18 163 lb 9.6 oz (74.2 kg)    General: Appears her stated age, well developed, well nourished in NAD. Cardiovascular: Irregular. No murmur noted. Pulmonary/Chest: Normal effort and positive vesicular breath sounds. No respiratory distress. No wheezes, rales or ronchi noted.  Abdomen: Soft. Normal bowel sounds. No distention or masses noted.  Tender to palpation over the bladder area. No CVA tenderness. MSK: Normal flexion, extension and rotation of the spine. No bony tenderness noted over the spine. Pain with palpation over the right sternocleidomastoid. Neuro: Alert and oriented, coordination normal.        Assessment & Plan:  Urinary Frequency:  Urinalysis: normal Will send urine culture Drink plenty of fluids  Tension Headache:  Continue Tylenol 1000 mg TID Take Flexeril 5 mg QHS tonight to see if that helps Heat may be helpful Neck exercises given  RTC as needed or if symptoms persist. Webb Silversmith, NP

## 2019-03-29 NOTE — Patient Instructions (Signed)
Tension Headache, Adult A tension headache is pain, pressure, or aching in your head. Tension headaches can last from 30 minutes to several days. Follow these instructions at home: Managing pain  Take over-the-counter and prescription medicines only as told by your doctor.  When you have a headache, lie down in a dark, quiet room.  If told, put ice on your head and neck: ? Put ice in a plastic bag. ? Place a towel between your skin and the bag. ? Leave the ice on for 20 minutes, 2-3 times a day.  If told, put heat on the back of your neck. Do this as often as your doctor tells you to. Use the kind of heat that your doctor recommends, such as a moist heat pack or a heating pad. ? Place a towel between your skin and the heat. ? Leave the heat on for 20-30 minutes. ? Remove the heat if your skin turns bright red. Eating and drinking  Eat meals on a regular schedule.  Watch how much alcohol you drink: ? If you are a woman and are not pregnant, do not drink more than 1 drink a day. ? If you are a man, do not drink more than 2 drinks a day.  Drink enough fluid to keep your pee (urine) pale yellow.  Do not use a lot of caffeine, or stop using caffeine. Lifestyle  Get enough sleep. Get 7-9 hours of sleep each night. Or get the amount of sleep that your doctor tells you to.  At bedtime, remove all electronic devices from your room. Examples of electronic devices are computers, phones, and tablets.  Find ways to lessen your stress. Some things that can lessen stress are: ? Exercise. ? Deep breathing. ? Yoga. ? Music. ? Positive thoughts.  Sit up straight. Do not tighten (tense) your muscles.  Do not use any products that have nicotine or tobacco in them, such as cigarettes and e-cigarettes. If you need help quitting, ask your doctor. General instructions   Keep all follow-up visits as told by your doctor. This is important.  Avoid things that can bring on headaches. Keep a  journal to find out if certain things bring on headaches. For example, write down: ? What you eat and drink. ? How much sleep you get. ? Any change to your diet or medicines. Contact a doctor if:  Your headache does not get better.  Your headache comes back.  You have a headache and sounds, light, or smells bother you.  You feel sick to your stomach (nauseous) or you throw up (vomit).  Your stomach hurts. Get help right away if:  You suddenly get a very bad headache along with any of these: ? A stiff neck. ? Feeling sick to your stomach. ? Throwing up. ? Feeling weak. ? Trouble seeing. ? Feeling short of breath. ? A rash. ? Feeling unusually sleepy. ? Trouble speaking. ? Pain in your eye or ear. ? Trouble walking or balancing. ? Feeling like you will pass out (faint). ? Passing out. Summary  A tension headache is pain, pressure, or aching in your head.  Tension headaches can last from 30 minutes to several days.  Lifestyle changes and medicines may help relieve pain. This information is not intended to replace advice given to you by your health care provider. Make sure you discuss any questions you have with your health care provider. Document Released: 10/14/2009 Document Revised: 07/02/2017 Document Reviewed: 10/30/2016 Elsevier Patient Education  2020 Elsevier   Inc.  

## 2019-03-29 NOTE — Telephone Encounter (Signed)
Left a message regarding appt on 03/30/19.

## 2019-03-30 ENCOUNTER — Telehealth (INDEPENDENT_AMBULATORY_CARE_PROVIDER_SITE_OTHER): Payer: Medicare Other | Admitting: Internal Medicine

## 2019-03-30 ENCOUNTER — Telehealth: Payer: Self-pay

## 2019-03-30 DIAGNOSIS — I480 Paroxysmal atrial fibrillation: Secondary | ICD-10-CM | POA: Diagnosis not present

## 2019-03-30 NOTE — Telephone Encounter (Signed)
-----   Message from Thompson Grayer, MD sent at 03/30/2019 10:51 AM EDT ----- Schedule afib ablation with [facility]/ICE/anesthesia  Cardiac CT 2D echo is also needed.  Please obtain this in the [place] office which is closer to her house.  Her land line number is [phone]

## 2019-03-30 NOTE — H&P (View-Only) (Signed)
Electrophysiology TeleHealth Note   Due to national recommendations of social distancing due to COVID 19, an audio/video telehealth visit is felt to be most appropriate for this patient at this time.  See MyChart message from today for the patient's consent to telehealth for [facility].   Date:  03/30/2019   ID:  Monica Whitaker, DOB 12-May-1946, MRN [id number]  Location: patient's home  Provider location:  [facility]  Evaluation Performed: Follow-up visit  PCP:  Jearld Fenton, NP   Electrophysiologist:  Dr Rayann Heman  Chief Complaint:  palpitations  History of Present Illness:    Monica Whitaker is a 73 y.o. female who presents via telehealth conferencing today.  Since last being seen in our clinic, the patient reports doing reasonably well.  She reports increasing frequency and duration of atrial fibrillation.  + palpitations and fatigue.  She finds episodes anxiety provoking and almost went to the ED last week.  Today, she denies symptoms of palpitations, chest pain, shortness of breath,  lower extremity edema, dizziness, presyncope, or syncope.  The patient is otherwise without complaint today.  The patient denies symptoms of fevers, chills, cough, or new SOB worrisome for COVID 19.  Past Medical History:  Diagnosis Date  . Anxiety    controlled with meds  . Anxiety   . Arrhythmia   . Atrial fibrillation ([name])    paroxysmal, failed medical therapy with flecainide,  NSVT with tikosyn  . Bruises easily   . CVA (cerebral vascular accident) ([place]) 12/2007  . Depression    medically controlled   . Low blood pressure    no falls, but if gets up to fast  . Stroke ([place]) 7 years ago   Lasting effects on balance, different personality.  . Thyroid disease   . 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 ATRIAL FIBRILLATION ABLATION  10/18/12   PVI by Dr Rayann Heman  . ATRIAL FIBRILLATION  ABLATION N/A 10/18/2012   Procedure: ATRIAL FIBRILLATION ABLATION;  Surgeon: Thompson Grayer, MD;  Location: [facility] CATH LAB;  Service: Cardiovascular;  Laterality: N/A;  . EYE SURGERY     on L/R eye, macular hole   . OPEN REDUCTION INTERNAL FIXATION (ORIF) DISTAL RADIAL FRACTURE Right 11/15/2014   Procedure: OPEN REDUCTION INTERNAL FIXATION (ORIF) RIGHT DISTAL RADIAL FRACTURE WITH ALLOGRAFT BONE GRAFT;  Surgeon: Roseanne Kaufman, MD;  Location: [place];  Service: Orthopedics;  Laterality: Right;  . TEE WITHOUT CARDIOVERSION N/A 10/18/2012   Procedure: TRANSESOPHAGEAL ECHOCARDIOGRAM (TEE);  Surgeon: Lelon Perla, MD;  Location: [facility] ENDOSCOPY;  Service: Cardiovascular;  Laterality: N/A;  Pre-Ablation at 12pm  . TONSILLECTOMY    . VAGINAL HYSTERECTOMY      Current Outpatient Medications  Medication Sig Dispense Refill  . acetaminophen (TYLENOL) 500 MG tablet Take 2 tablets by mouth every 6 hours as needed for pain    . amphetamine-dextroamphetamine (ADDERALL XR) 10 MG 24 hr capsule Take 1 capsule (10 mg total) by mouth daily. 90 capsule 0  . apixaban (ELIQUIS) 5 MG TABS tablet Take 1 tablet (5 mg total) by mouth 2 (two) times daily. 180 tablet 1  . calcium carbonate (OS-CAL) 600 MG TABS Take 600 mg by mouth daily with breakfast.     . cholecalciferol (VITAMIN D) 1000 UNITS tablet Take 2,000 Units by mouth daily.     . cyclobenzaprine (FLEXERIL) 10 MG  tablet Take 0.5-1 tablets (5-10 mg total) by mouth at bedtime as needed for muscle spasms. 30 tablet 0  . denosumab (PROLIA) 60 MG/ML SOLN injection Inject into the skin.    Marland Kitchen diltiazem (CARDIZEM CD) 240 MG 24 hr capsule TAKE 1 CAPSULE (240 MG TOTAL) BY MOUTH DAILY. MAY TAKE EXTRA CAPSULE DAILY AS NEEDED FOR AFIB 95 capsule 1  . diltiazem (CARDIZEM) 30 MG tablet Ttake 1 tablet every 4 hours AS NEEDED for rapid afib heart rate over 100 45 tablet 1  . DULoxetine (CYMBALTA) 30 MG capsule TAKE 1 CAPSULE BY MOUTH EVERY DAY along with 60 mg  capsule 90 capsule 1  . DULoxetine (CYMBALTA) 60 MG capsule Take 1 capsule (60 mg total) by mouth daily. Take with 30 mg duloxetine 90 capsule 1  . estradiol (ESTRACE VAGINAL) 0.1 MG/GM vaginal cream Apply 0.5mg  (pea-sized amount)  just inside the vaginal introitus with a finger-tip on Monday, Wednesday and Friday nights. 30 g 12  . flecainide (TAMBOCOR) 100 MG tablet TAKE 1 TABLET BY MOUTH TWICE A DAY 180 tablet 1  . furosemide (LASIX) 20 MG tablet Take 0.5 tablets (10 mg total) by mouth as needed for edema. 10 tablet 3  . levothyroxine (SYNTHROID) 25 MCG tablet TAKE 1 TABLET BY MOUTH EVERY DAY 90 tablet 0  . Multiple Vitamin (MULTIVITAMIN) tablet Take 1 tablet by mouth daily.      . Oral Electrolytes (BUFFERED SALT PO) Take 1 capsule by mouth daily as needed (Electrolyte/Salt supplement).     . pramipexole (MIRAPEX) 0.125 MG tablet TAKE 2 TABLETS BY MOUTH EVERY MORNING 180 tablet 0  . Timolol Maleate (TIMOPTIC OP) Place 1 drop into the left eye daily.     Marland Kitchen tiZANidine (ZANAFLEX) 2 MG tablet Take 0.5-1 tablets (1-2 mg total) by mouth at bedtime. 30 tablet 0  . vitamin B-12 (CYANOCOBALAMIN) 1000 MCG tablet Take 1,000 mcg by mouth daily.    . vitamin C (ASCORBIC ACID) 500 MG tablet Take 500 mg by mouth daily.    . vitamin E 400 UNIT capsule Take 400 Units by mouth daily.       No current facility-administered medications for this visit.     Allergies:   Darvon, Propoxyphene, Propoxyphene n-acetaminophen, and Levofloxacin   Social History:  The patient  reports that she has never smoked. She has never used smokeless tobacco. She reports current alcohol use of about 1.0 standard drinks of alcohol per week. She reports that she does not use drugs.   Family History:  The patient's family history includes Multiple sclerosis in her father.   ROS:  Please see the history of present illness.   All other systems are personally reviewed and negative.    Exam:    Vital Signs:  There were no vitals  taken for this visit.  Well sounding and appearing, alert and conversant, regular work of breathing,  good skin color Eyes- anicteric, neuro- grossly intact, skin- no apparent rash or lesions or cyanosis, mouth- oral mucosa is pink  Labs/Other Tests and Data Reviewed:    Recent Labs: 05/10/2018: ALT 11; BUN 24; Creatinine, Ser 0.69; Hemoglobin 13.8; Platelets 300.0; Potassium 4.2; Sodium 134; TSH 1.56   Wt Readings from Last 3 Encounters:  03/29/19 173 lb (78.5 kg)  03/13/19 170 lb 12 oz (77.5 kg)  06/22/18 167 lb 8 oz (76 kg)     AF ablation note 10/18/12 reviewed today   ASSESSMENT & PLAN:    1.  afib The patient has  symptomatic, recurrent paroxysmal atrial fibrillation. she has failed medical therapy with flecainide and tikosyn.   She underwent ablation 2014. Chads2vasc score is 4.  she is anticoagulated with eliquis . Therapeutic strategies for afib including medicine and ablation were discussed in detail with the patient today. Risk, benefits, and alternatives to EP study and radiofrequency ablation for afib were also discussed in detail today. These risks include but are not limited to stroke, bleeding, vascular damage, tamponade, perforation, damage to the esophagus, lungs, and other structures, pulmonary vein stenosis, worsening renal function, and death. The patient understands these risk and wishes to proceed.  We will therefore proceed with catheter ablation at the next available time.  Carto, ICE, anesthesia are requested for the procedure.  Will also obtain cardiac CT prior to the procedure to exclude LAA thrombus and further evaluate atrial anatomy.  We will also obtain 2 D echo to evaluate for valvular heart disease or other structural abnormalities which could contribute to her afib.  Patient Risk:  after full review of this patients clinical status, I feel that they are at highrisk at this time.  Today, I have spent 15 minutes with the patient with telehealth technology  discussing arrhythmia management .    Army Fossa, MD  03/30/2019 10:46 AM     [facility] [address] [place] [address] [phone] (office) [phone] (fax)

## 2019-03-30 NOTE — Progress Notes (Signed)
Electrophysiology TeleHealth Note   Due to national recommendations of social distancing due to COVID 19, an audio/video telehealth visit is felt to be most appropriate for this patient at this time.  See MyChart message from today for the patient's consent to telehealth for [facility].   Date:  03/30/2019   ID:  Monica Whitaker, DOB Mar 09, 1946, MRN [id number]  Location: patient's home  Provider location:  [facility]  Evaluation Performed: Follow-up visit  PCP:  Jearld Fenton, NP   Electrophysiologist:  Dr Rayann Heman  Chief Complaint:  palpitations  History of Present Illness:    Monica Whitaker is a 73 y.o. female who presents via telehealth conferencing today.  Since last being seen in our clinic, the patient reports doing reasonably well.  She reports increasing frequency and duration of atrial fibrillation.  + palpitations and fatigue.  She finds episodes anxiety provoking and almost went to the ED last week.  Today, she denies symptoms of palpitations, chest pain, shortness of breath,  lower extremity edema, dizziness, presyncope, or syncope.  The patient is otherwise without complaint today.  The patient denies symptoms of fevers, chills, cough, or new SOB worrisome for COVID 19.  Past Medical History:  Diagnosis Date  . Anxiety    controlled with meds  . Anxiety   . Arrhythmia   . Atrial fibrillation ([name])    paroxysmal, failed medical therapy with flecainide,  NSVT with tikosyn  . Bruises easily   . CVA (cerebral vascular accident) ([place]) 12/2007  . Depression    medically controlled   . Low blood pressure    no falls, but if gets up to fast  . Stroke ([place]) 7 years ago   Lasting effects on balance, different personality.  . Thyroid disease   . 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 ATRIAL FIBRILLATION ABLATION  10/18/12   PVI by Dr Rayann Heman  . ATRIAL FIBRILLATION  ABLATION N/A 10/18/2012   Procedure: ATRIAL FIBRILLATION ABLATION;  Surgeon: Thompson Grayer, MD;  Location: [facility] CATH LAB;  Service: Cardiovascular;  Laterality: N/A;  . EYE SURGERY     on L/R eye, macular hole   . OPEN REDUCTION INTERNAL FIXATION (ORIF) DISTAL RADIAL FRACTURE Right 11/15/2014   Procedure: OPEN REDUCTION INTERNAL FIXATION (ORIF) RIGHT DISTAL RADIAL FRACTURE WITH ALLOGRAFT BONE GRAFT;  Surgeon: Roseanne Kaufman, MD;  Location: [place];  Service: Orthopedics;  Laterality: Right;  . TEE WITHOUT CARDIOVERSION N/A 10/18/2012   Procedure: TRANSESOPHAGEAL ECHOCARDIOGRAM (TEE);  Surgeon: Lelon Perla, MD;  Location: [facility] ENDOSCOPY;  Service: Cardiovascular;  Laterality: N/A;  Pre-Ablation at 12pm  . TONSILLECTOMY    . VAGINAL HYSTERECTOMY      Current Outpatient Medications  Medication Sig Dispense Refill  . acetaminophen (TYLENOL) 500 MG tablet Take 2 tablets by mouth every 6 hours as needed for pain    . amphetamine-dextroamphetamine (ADDERALL XR) 10 MG 24 hr capsule Take 1 capsule (10 mg total) by mouth daily. 90 capsule 0  . apixaban (ELIQUIS) 5 MG TABS tablet Take 1 tablet (5 mg total) by mouth 2 (two) times daily. 180 tablet 1  . calcium carbonate (OS-CAL) 600 MG TABS Take 600 mg by mouth daily with breakfast.     . cholecalciferol (VITAMIN D) 1000 UNITS tablet Take 2,000 Units by mouth daily.     . cyclobenzaprine (FLEXERIL) 10 MG  tablet Take 0.5-1 tablets (5-10 mg total) by mouth at bedtime as needed for muscle spasms. 30 tablet 0  . denosumab (PROLIA) 60 MG/ML SOLN injection Inject into the skin.    Marland Kitchen diltiazem (CARDIZEM CD) 240 MG 24 hr capsule TAKE 1 CAPSULE (240 MG TOTAL) BY MOUTH DAILY. MAY TAKE EXTRA CAPSULE DAILY AS NEEDED FOR AFIB 95 capsule 1  . diltiazem (CARDIZEM) 30 MG tablet Ttake 1 tablet every 4 hours AS NEEDED for rapid afib heart rate over 100 45 tablet 1  . DULoxetine (CYMBALTA) 30 MG capsule TAKE 1 CAPSULE BY MOUTH EVERY DAY along with 60 mg  capsule 90 capsule 1  . DULoxetine (CYMBALTA) 60 MG capsule Take 1 capsule (60 mg total) by mouth daily. Take with 30 mg duloxetine 90 capsule 1  . estradiol (ESTRACE VAGINAL) 0.1 MG/GM vaginal cream Apply 0.5mg  (pea-sized amount)  just inside the vaginal introitus with a finger-tip on Monday, Wednesday and Friday nights. 30 g 12  . flecainide (TAMBOCOR) 100 MG tablet TAKE 1 TABLET BY MOUTH TWICE A DAY 180 tablet 1  . furosemide (LASIX) 20 MG tablet Take 0.5 tablets (10 mg total) by mouth as needed for edema. 10 tablet 3  . levothyroxine (SYNTHROID) 25 MCG tablet TAKE 1 TABLET BY MOUTH EVERY DAY 90 tablet 0  . Multiple Vitamin (MULTIVITAMIN) tablet Take 1 tablet by mouth daily.      . Oral Electrolytes (BUFFERED SALT PO) Take 1 capsule by mouth daily as needed (Electrolyte/Salt supplement).     . pramipexole (MIRAPEX) 0.125 MG tablet TAKE 2 TABLETS BY MOUTH EVERY MORNING 180 tablet 0  . Timolol Maleate (TIMOPTIC OP) Place 1 drop into the left eye daily.     Marland Kitchen tiZANidine (ZANAFLEX) 2 MG tablet Take 0.5-1 tablets (1-2 mg total) by mouth at bedtime. 30 tablet 0  . vitamin B-12 (CYANOCOBALAMIN) 1000 MCG tablet Take 1,000 mcg by mouth daily.    . vitamin C (ASCORBIC ACID) 500 MG tablet Take 500 mg by mouth daily.    . vitamin E 400 UNIT capsule Take 400 Units by mouth daily.       No current facility-administered medications for this visit.     Allergies:   Darvon, Propoxyphene, Propoxyphene n-acetaminophen, and Levofloxacin   Social History:  The patient  reports that she has never smoked. She has never used smokeless tobacco. She reports current alcohol use of about 1.0 standard drinks of alcohol per week. She reports that she does not use drugs.   Family History:  The patient's family history includes Multiple sclerosis in her father.   ROS:  Please see the history of present illness.   All other systems are personally reviewed and negative.    Exam:    Vital Signs:  There were no vitals  taken for this visit.  Well sounding and appearing, alert and conversant, regular work of breathing,  good skin color Eyes- anicteric, neuro- grossly intact, skin- no apparent rash or lesions or cyanosis, mouth- oral mucosa is pink  Labs/Other Tests and Data Reviewed:    Recent Labs: 05/10/2018: ALT 11; BUN 24; Creatinine, Ser 0.69; Hemoglobin 13.8; Platelets 300.0; Potassium 4.2; Sodium 134; TSH 1.56   Wt Readings from Last 3 Encounters:  03/29/19 173 lb (78.5 kg)  03/13/19 170 lb 12 oz (77.5 kg)  06/22/18 167 lb 8 oz (76 kg)     AF ablation note 10/18/12 reviewed today   ASSESSMENT & PLAN:    1.  afib The patient has  symptomatic, recurrent paroxysmal atrial fibrillation. she has failed medical therapy with flecainide and tikosyn.   She underwent ablation 2014. Chads2vasc score is 4.  she is anticoagulated with eliquis . Therapeutic strategies for afib including medicine and ablation were discussed in detail with the patient today. Risk, benefits, and alternatives to EP study and radiofrequency ablation for afib were also discussed in detail today. These risks include but are not limited to stroke, bleeding, vascular damage, tamponade, perforation, damage to the esophagus, lungs, and other structures, pulmonary vein stenosis, worsening renal function, and death. The patient understands these risk and wishes to proceed.  We will therefore proceed with catheter ablation at the next available time.  Carto, ICE, anesthesia are requested for the procedure.  Will also obtain cardiac CT prior to the procedure to exclude LAA thrombus and further evaluate atrial anatomy.  We will also obtain 2 D echo to evaluate for valvular heart disease or other structural abnormalities which could contribute to her afib.  Patient Risk:  after full review of this patients clinical status, I feel that they are at highrisk at this time.  Today, I have spent 15 minutes with the patient with telehealth technology  discussing arrhythmia management .    Army Fossa, MD  03/30/2019 10:46 AM     [facility] [address] [place] [address] [phone] (office) [phone] (fax)

## 2019-03-31 LAB — URINE CULTURE
MICRO NUMBER:: 814135
SPECIMEN QUALITY:: ADEQUATE

## 2019-03-31 MED ORDER — NITROFURANTOIN MONOHYD MACRO 100 MG PO CAPS: 100.0000 mg | ORAL_CAPSULE | Freq: Two times a day (BID) | ORAL | 0 refills | Status: DC

## 2019-03-31 NOTE — Addendum Note (Signed)
Addended by: Jearld Fenton on: 03/31/2019 02:08 PM   Modules accepted: Orders

## 2019-03-31 NOTE — Telephone Encounter (Signed)
Patient is following up on previous message

## 2019-03-31 NOTE — Telephone Encounter (Signed)
Call placed to pt re: Monica Whitaker returning her call. I left a detailed message that she is out of the office today and that we do have her mychart messages and phone messages and she would return her call on Monday.

## 2019-03-31 NOTE — Telephone Encounter (Signed)
Follow up   Per the previous message the patient would like for Anderson Malta to give her a call.

## 2019-04-03 ENCOUNTER — Encounter: Payer: Self-pay | Admitting: Family Medicine

## 2019-04-03 ENCOUNTER — Ambulatory Visit (INDEPENDENT_AMBULATORY_CARE_PROVIDER_SITE_OTHER)
Admission: RE | Admit: 2019-04-03 | Discharge: 2019-04-03 | Disposition: A | Discharge status: Home / Self Care | Payer: Medicare Other | Source: Ambulatory Visit | Attending: Family Medicine | Admitting: Family Medicine

## 2019-04-03 ENCOUNTER — Other Ambulatory Visit: Payer: Self-pay

## 2019-04-03 ENCOUNTER — Telehealth: Payer: Self-pay

## 2019-04-03 ENCOUNTER — Telehealth: Payer: Self-pay | Admitting: Internal Medicine

## 2019-04-03 ENCOUNTER — Ambulatory Visit (INDEPENDENT_AMBULATORY_CARE_PROVIDER_SITE_OTHER): Payer: Medicare Other | Admitting: Family Medicine

## 2019-04-03 VITALS — BP 100/60 | HR 66 | Temp 98.3°F | Ht 69.5 in | Wt 172.5 lb

## 2019-04-03 DIAGNOSIS — M25551 Pain in right hip: Secondary | ICD-10-CM | POA: Diagnosis not present

## 2019-04-03 DIAGNOSIS — M549 Dorsalgia, unspecified: Secondary | ICD-10-CM | POA: Diagnosis not present

## 2019-04-03 DIAGNOSIS — M25552 Pain in left hip: Secondary | ICD-10-CM

## 2019-04-03 DIAGNOSIS — R102 Pelvic and perineal pain unspecified side: Secondary | ICD-10-CM

## 2019-04-03 DIAGNOSIS — M533 Sacrococcygeal disorders, not elsewhere classified: Secondary | ICD-10-CM

## 2019-04-03 DIAGNOSIS — R52 Pain, unspecified: Secondary | ICD-10-CM

## 2019-04-03 DIAGNOSIS — I480 Paroxysmal atrial fibrillation: Secondary | ICD-10-CM

## 2019-04-03 MED ORDER — TRAMADOL HCL 50 MG PO TABS: 50.0000 mg | ORAL_TABLET | Freq: Three times a day (TID) | ORAL | 0 refills | Status: AC | PRN

## 2019-04-03 NOTE — Progress Notes (Signed)
[name], MD Primary Care and [place] at [facility] [place], [address] Phone: [phone]  FAX: [phone]  Monica Whitaker - 73 y.o. female  MRN [id number]  Date of Birth: [birth year]  Visit Date: 04/03/2019  PCP: Jearld Fenton, NP  Referred by: Jearld Fenton, NP  Chief Complaint  Patient presents with  . Hip Pain    Left   Subjective:   Monica Whitaker is a 73 y.o. very pleasant female patient with Body mass index is 25.11 kg/m. who presents with the following:  The patient presents with a list of about 15 different questions about musculoskeletal problems.  I saw her about 3 weeks ago and placed the patient on some oral prednisone and gave her some Zanaflex.  In that interval time period, she states that she is doing remarkably poorly.  She presents for what she describes as hip pain on the left side.  Initially she thought it was on the buttocks, and I thought this was referred pain from the spine.  At now she says that she is having some very significant difficulty moving and walking on this hip.  Up to 99 on temp, which for her she states is an elevated temperature.  Using heat.  This is not helped at all. Some clicking.  Hurts to put weight on it per her report.  This does not indicate groin pain, and she describes this mostly in the pelvic region.  Questions about pain management.  Tylenol.  She is on antiplatelet therapy. Using Flexeril  She also questions whether or not physical therapy would be a reasonable course of action.  Mr pelvis?  Past Medical History, Surgical History, Social History, Family History, Problem List, Medications, and Allergies have been reviewed and updated if relevant.  Patient Active Problem List   Diagnosis Date Noted  . Anxiety 06/27/2018  . Orthostatic hypotension 11/04/2017  . Hypothyroidism 11/04/2017  . OSA on CPAP 06/21/2017  . Persistent atrial  fibrillation 06/21/2017  . Cerebrovascular accident (CVA) due to embolism of left middle cerebral artery ([place]) 03/04/2017  . Osteoporosis 12/26/2015  . ANXIETY DEPRESSION 04/10/2010    Past Medical History:  Diagnosis Date  . Anxiety    controlled with meds  . Anxiety   . Arrhythmia   . Atrial fibrillation ([name])    paroxysmal, failed medical therapy with flecainide,  NSVT with tikosyn  . Bruises easily   . CVA (cerebral vascular accident) ([place]) 12/2007  . Depression    medically controlled   . Low blood pressure    no falls, but if gets up to fast  . Stroke ([place]) 7 years ago   Lasting effects on balance, different personality.  . Thyroid disease   . Ventricular tachycardia ([place])    pt told at [facility] that she had RVOT VT    Past Surgical History:  Procedure Laterality Date  . ATRIAL FIBRILLATION ABLATION  10/18/12   PVI by Dr Rayann Heman  . ATRIAL FIBRILLATION ABLATION N/A 10/18/2012   Procedure: ATRIAL FIBRILLATION ABLATION;  Surgeon: Thompson Grayer, MD;  Location: [facility] CATH LAB;  Service: Cardiovascular;  Laterality: N/A;  . EYE SURGERY     on L/R eye, macular hole   . OPEN REDUCTION INTERNAL FIXATION (ORIF) DISTAL RADIAL FRACTURE Right 11/15/2014   Procedure: OPEN REDUCTION INTERNAL FIXATION (ORIF) RIGHT DISTAL RADIAL FRACTURE WITH ALLOGRAFT BONE GRAFT;  Surgeon: Roseanne Kaufman, MD;  Location: [facility];  Service: Orthopedics;  Laterality: Right;  . TEE WITHOUT CARDIOVERSION N/A 10/18/2012   Procedure: TRANSESOPHAGEAL ECHOCARDIOGRAM (TEE);  Surgeon: Lelon Perla, MD;  Location: [facility] ENDOSCOPY;  Service: Cardiovascular;  Laterality: N/A;  Pre-Ablation at 12pm  . TONSILLECTOMY    . VAGINAL HYSTERECTOMY      Social History   Socioeconomic History  . Marital status: Widowed    Spouse name: Not on file  . Number of children: 0  . Years of education: Not on file  . Highest education level: Not on file  Occupational History  . Occupation: Retired    Fish farm manager:  RETIRED  Social Needs  . Financial resource strain: Not on file  . Food insecurity    Worry: Not on file    Inability: Not on file  . Transportation needs    Medical: Not on file    Non-medical: Not on file  Tobacco Use  . Smoking status: Never Smoker  . Smokeless tobacco: Never Used  Substance and Sexual Activity  . Alcohol use: Yes    Alcohol/week: 1.0 standard drinks    Types: 1 Standard drinks or equivalent per week    Comment: regular  . Drug use: No  . Sexual activity: Never  Lifestyle  . Physical activity    Days per week: Not on file    Minutes per session: Not on file  . Stress: Not on file  Relationships  . Social Herbalist on phone: Not on file    Gets together: Not on file    Attends religious service: Not on file    Active member of club or organization: Not on file    Attends meetings of clubs or organizations: Not on file    Relationship status: Not on file  . Intimate partner violence    Fear of current or ex partner: Not on file    Emotionally abused: Not on file    Physically abused: Not on file    Forced sexual activity: Not on file  Other Topics Concern  . Not on file  Social History Narrative   Lives in [place] with her husband.  No children.  Former Psychologist, prison and probation services   Enjoys exercise- water classes, yoga Editor, commissioning.       Has a living will-    Would desire CPR   Would not want prolonged life support if futile.    Family History  Problem Relation Age of Onset  . Multiple sclerosis Father   . Breast cancer Neg Hx     Allergies  Allergen Reactions  . Darvon Other (See Comments)    Severe panic attacks  . Propoxyphene Other (See Comments)    GI Upset Severe panic attacks GI Upset  . Propoxyphene N-Acetaminophen Other (See Comments)    Severe panic attacks  . Levofloxacin Anxiety and Other (See Comments)    Causes panic attacks.    Medication list reviewed and updated in full in [place].  GEN: No  fevers, chills. Nontoxic. Primarily MSK c/o today. MSK: Detailed in the HPI GI: tolerating PO intake without difficulty Neuro: No numbness, parasthesias, or tingling associated. Otherwise the pertinent positives of the ROS are noted above.   Objective:   BP 100/60   Pulse 66   Temp 98.3 F (36.8 C) (Temporal)   Ht 5' 9.5" (1.765 m)   Wt 172 lb 8 oz (78.2 kg)   SpO2 94%   BMI 25.11 kg/m    GEN:  WDWN, NAD, Non-toxic, Alert & Oriented x 3 HEENT: Atraumatic, Normocephalic.  Ears and Nose: No external deformity. EXTR: No clubbing/cyanosis/edema NEURO: Notable difficulty rising from a seated position, she has notable difficulty getting on the examination table.  At baseline this is not the case. PSYCH: Normally interactive. Conversant. Not depressed or anxious appearing.  Calm demeanor.    Passive motion, the patient does have relatively full range of motion at the hip.  Corky Sox and fadir cause pain posteriorly.  GTB is nt Unable to do a hop test.  Diffusely tender around the entirety of the hip including the pelvic rim, posterior pelvis is well as the pubic symphysis and pubic rami.  Strength of testing, flexion is 3+/5.  Abduction is 3+/5.  Abduction is 4/5.  There is pain in all movements.  Radiology: Dg Sacrum/coccyx  Result Date: 04/03/2019 CLINICAL DATA:  worsening severe posterior pelvic pain EXAM: SACRUM AND COCCYX - 2+ VIEW COMPARISON:  Lumbar spine plain films and MRI 03/08/2017, 03/30/2017 FINDINGS: Transitional anatomy at the lumbosacral junction. Degenerative changes are seen in the LOWER lumbar spine. There is no acute fracture or subluxation. IMPRESSION: No evidence for acute abnormality. Electronically Signed   By: Nolon Nations M.D.   On: 04/03/2019 15:33   Dg Hip Unilat With Pelvis 2-3 Views Left  Result Date: 04/03/2019 CLINICAL DATA:  severe worsening posterior pelvic pain EXAM: DG HIP (WITH OR WITHOUT PELVIS) 2-3V LEFT COMPARISON:  Lumbar spine 03/08/2017  FINDINGS: Images are performed with weight-bearing. There is no acute fracture or subluxation. No lytic blastic lesion. Degenerative changes are seen in the visualized LOWER lumbar spine. IMPRESSION: No evidence for acute abnormality. Electronically Signed   By: Nolon Nations M.D.   On: 04/03/2019 15:32     Assessment and Plan:     ICD-10-CM   1. Acute back pain, unspecified back location, unspecified back pain laterality  M54.9 Ambulatory referral to Physical Therapy  2. Pelvic pain  R10.2 DG HIP UNILAT WITH PELVIS 2-3 VIEWS LEFT    DG Sacrum/Coccyx  3. Sacral pain  M53.3 DG HIP UNILAT WITH PELVIS 2-3 VIEWS LEFT    DG Sacrum/Coccyx  4. Hip pain, bilateral  M25.551 DG HIP UNILAT WITH PELVIS 2-3 VIEWS LEFT   M25.552 DG Sacrum/Coccyx    Ambulatory referral to Physical Therapy  5. Pain aggravated by walking  R52 Ambulatory referral to Physical Therapy   >40 minutes spent in face to face time with patient, >50% spent in counselling or coordination of care: I did my best to try to answer all of her lengthy questions in the examination room.  This is somewhere on the order of 10-15 different questions regarding her symptoms and care.  Decidedly different and worsening on exam compared to the last exam, approximately 3 weeks ago.  At this point she is having significant difficulty walking, getting up out of a chair, and rotating getting onto a bed or in and out of a car.  Pain is primarily around the pelvic region.  She does not describe anterior groin pain.  She is not having any bowel or bladder incontinence.  Plain films are grossly unremarkable.  At this point I have spoken with the patient again, and she requested do some physical therapy at [facility].  I think that that is entirely reasonable, so we will try this first and then she can have her cardiac ablation done.  Follow-up: No follow-ups on file.  Meds ordered this encounter  Medications  . traMADol Veatrice Bourbon)  50 MG tablet     Sig: Take 1 tablet (50 mg total) by mouth every 8 (eight) hours as needed for up to 7 days for moderate pain.    Dispense:  20 tablet    Refill:  0   Orders Placed This Encounter  Procedures  . DG HIP UNILAT WITH PELVIS 2-3 VIEWS LEFT  . DG Sacrum/Coccyx  . Ambulatory referral to Physical Therapy    Signed,  Frederico Hamman T. Eithel Ryall, MD   Outpatient Encounter Medications as of 04/03/2019  Medication Sig  . acetaminophen (TYLENOL) 500 MG tablet Take 2 tablets by mouth every 6 hours as needed for pain  . amphetamine-dextroamphetamine (ADDERALL XR) 10 MG 24 hr capsule Take 1 capsule (10 mg total) by mouth daily.  Marland Kitchen apixaban (ELIQUIS) 5 MG TABS tablet Take 1 tablet (5 mg total) by mouth 2 (two) times daily.  . calcium carbonate (OS-CAL) 600 MG TABS Take 600 mg by mouth daily with breakfast.   . cholecalciferol (VITAMIN D) 1000 UNITS tablet Take 2,000 Units by mouth daily.   . cyclobenzaprine (FLEXERIL) 10 MG tablet Take 0.5-1 tablets (5-10 mg total) by mouth at bedtime as needed for muscle spasms.  Marland Kitchen denosumab (PROLIA) 60 MG/ML SOLN injection Inject into the skin.  Marland Kitchen diltiazem (CARDIZEM CD) 240 MG 24 hr capsule TAKE 1 CAPSULE (240 MG TOTAL) BY MOUTH DAILY. MAY TAKE EXTRA CAPSULE DAILY AS NEEDED FOR AFIB  . diltiazem (CARDIZEM) 30 MG tablet Ttake 1 tablet every 4 hours AS NEEDED for rapid afib heart rate over 100  . DULoxetine (CYMBALTA) 30 MG capsule TAKE 1 CAPSULE BY MOUTH EVERY DAY along with 60 mg capsule  . DULoxetine (CYMBALTA) 60 MG capsule Take 1 capsule (60 mg total) by mouth daily. Take with 30 mg duloxetine  . estradiol (ESTRACE VAGINAL) 0.1 MG/GM vaginal cream Apply 0.5mg  (pea-sized amount)  just inside the vaginal introitus with a finger-tip on Monday, Wednesday and Friday nights.  . flecainide (TAMBOCOR) 100 MG tablet TAKE 1 TABLET BY MOUTH TWICE A DAY  . furosemide (LASIX) 20 MG tablet Take 0.5 tablets (10 mg total) by mouth as needed for edema.  Marland Kitchen levothyroxine (SYNTHROID) 25 MCG  tablet TAKE 1 TABLET BY MOUTH EVERY DAY  . Multiple Vitamin (MULTIVITAMIN) tablet Take 1 tablet by mouth daily.    . nitrofurantoin, macrocrystal-monohydrate, (MACROBID) 100 MG capsule Take 1 capsule (100 mg total) by mouth 2 (two) times daily.  . Oral Electrolytes (BUFFERED SALT PO) Take 1 capsule by mouth daily as needed (Electrolyte/Salt supplement).   . pramipexole (MIRAPEX) 0.125 MG tablet TAKE 2 TABLETS BY MOUTH EVERY MORNING  . Timolol Maleate (TIMOPTIC OP) Place 1 drop into the left eye daily.   . vitamin B-12 (CYANOCOBALAMIN) 1000 MCG tablet Take 1,000 mcg by mouth daily.  . vitamin C (ASCORBIC ACID) 500 MG tablet Take 500 mg by mouth daily.  . vitamin E 400 UNIT capsule Take 400 Units by mouth daily.    . [DISCONTINUED] timolol (TIMOPTIC) 0.5 % ophthalmic solution   . [DISCONTINUED] tiZANidine (ZANAFLEX) 2 MG tablet Take 0.5-1 tablets (1-2 mg total) by mouth at bedtime.  . traMADol (ULTRAM) 50 MG tablet Take 1 tablet (50 mg total) by mouth every 8 (eight) hours as needed for up to 7 days for moderate pain.   No facility-administered encounter medications on file as of 04/03/2019.

## 2019-04-03 NOTE — Telephone Encounter (Signed)
Called and lvm for patient to call and schedule echo

## 2019-04-03 NOTE — Telephone Encounter (Signed)
-----   Message from Damian Leavell, RN sent at 04/03/2019  8:42 AM EDT ----- Regarding: needs ECHO in [place] all, I entered an order for Pt to have ECHO in [place].  She needs it ASAP, scheduling ablation for 9/15.  Please forward to whoever this needs to go to.  Thank you! Sonia Baller

## 2019-04-03 NOTE — Telephone Encounter (Signed)
See MyChart messages.

## 2019-04-04 ENCOUNTER — Other Ambulatory Visit: Payer: Self-pay

## 2019-04-04 ENCOUNTER — Other Ambulatory Visit: Payer: Self-pay | Admitting: Family Medicine

## 2019-04-04 DIAGNOSIS — Z01810 Encounter for preprocedural cardiovascular examination: Secondary | ICD-10-CM

## 2019-04-04 DIAGNOSIS — I480 Paroxysmal atrial fibrillation: Secondary | ICD-10-CM

## 2019-04-04 MED ORDER — METOPROLOL TARTRATE 100 MG PO TABS: 100.0000 mg | ORAL_TABLET | Freq: Once | ORAL | 0 refills | Status: DC

## 2019-04-04 NOTE — Telephone Encounter (Signed)
Last office visit 04/03/2019 by Dr. Lorelei Pont for Pelvic Pain.  Last refilled 03/13/2019 for #30 with no refills.  No future appointments.

## 2019-04-04 NOTE — Telephone Encounter (Signed)
LMTCB for her Ablation 04/18/19 instructions and her pretesting 04/14/19. Still waiting for her CT to be scheduled.

## 2019-04-05 ENCOUNTER — Telehealth: Payer: Self-pay

## 2019-04-05 ENCOUNTER — Telehealth (HOSPITAL_COMMUNITY): Payer: Self-pay | Admitting: Emergency Medicine

## 2019-04-05 ENCOUNTER — Telehealth: Payer: Self-pay | Admitting: Family Medicine

## 2019-04-05 NOTE — Telephone Encounter (Signed)
Patient has been having back pain.  Patient saw Dr.Copland for the back pain and she'd like to be referred to [facility] for physical therapy.  Patient's having surgery on 04/18/19 and wanted to start physical therapy before the surgery.

## 2019-04-05 NOTE — Telephone Encounter (Signed)
Please advise Pt to take the lopressor with her in her pocket to her CT test.

## 2019-04-05 NOTE — Telephone Encounter (Signed)
done

## 2019-04-05 NOTE — Telephone Encounter (Signed)
Spoke with the pt and went over all of her Pre testing and ablation instructions...   Pt is sched for her Cardiac CT and in reviewing her instructions she is reluctant to take the Lopressor prior to her CT since she has history of very low BP.. today 90/50.Marland Kitchen however she is unaware of her HR.. I advised her to write it down the next time she checks her BP.   According to the protocol if her HR is greater then 55 and she is less then 73 years old... she would be expected to take 100 mg.... will check with Dr. Rayann Heman to see if this is appropriate and if needed then will advise the pt.

## 2019-04-05 NOTE — Telephone Encounter (Signed)
Returning phone call after patient left VM: Pt with questions about medications prior to [facility] CTA on 9/10 Advised her to hold meto since BP runs low, will assess VS when she checks in for test.  Pt with questions about ablation: Reviewed ablation instructions per letter in chart, advised her that she should be getting the letters in the mail soon.   All questions answered to best of my ability. Denies further questions.   Marchia Bond RN Navigator Cardiac Imaging [facility] [phone] Office  [phone] Cell

## 2019-04-05 NOTE — Telephone Encounter (Signed)
Pt advised... pt has significant history of Hypotension and keeps salt tablets with her.

## 2019-04-05 NOTE — Telephone Encounter (Signed)
Follow up ° ° °Patient is returning your call. Please call. ° ° ° °

## 2019-04-06 NOTE — Telephone Encounter (Signed)
Patient advised that referral to [facility] has been sent over. Patient will follow up with them this afternoon to try and get this set up.

## 2019-04-06 NOTE — Telephone Encounter (Signed)
Pt called checking on her referral. She said she really needs to start by next week. I told her I will let the referral coordinator know.

## 2019-04-11 ENCOUNTER — Telehealth (HOSPITAL_COMMUNITY): Payer: Self-pay | Admitting: Emergency Medicine

## 2019-04-11 ENCOUNTER — Ambulatory Visit (INDEPENDENT_AMBULATORY_CARE_PROVIDER_SITE_OTHER): Payer: Medicare Other

## 2019-04-11 ENCOUNTER — Other Ambulatory Visit: Payer: Self-pay

## 2019-04-11 DIAGNOSIS — I480 Paroxysmal atrial fibrillation: Secondary | ICD-10-CM

## 2019-04-11 NOTE — Telephone Encounter (Signed)
Pt had left two VM on my answering machine regarding her upcoming cardiac CT for ablation  Pt states that she checks her BP multiple times a day, has history of hypotension and takes salt tablets for low BP; pt VERY anxious about taking prescribed metoprolol for cardiac CT on 9/10; pt reporting last few BPs have been [id number] systolic; I advised the patient not to take prescribed metoprolol prior to [place] [facility] [phone] Office  [phone] Cell

## 2019-04-12 ENCOUNTER — Telehealth (HOSPITAL_COMMUNITY): Payer: Self-pay | Admitting: Emergency Medicine

## 2019-04-12 NOTE — Telephone Encounter (Signed)
Reaching out to patient to offer assistance regarding upcoming cardiac imaging study; pt verbalizes understanding of appt date/time, parking situation and where to check in, pre-test NPO status and medications ordered, and verified current allergies; name and call back number provided for further questions should they arise Monica Bond RN Navigator Cardiac Imaging [facility] [phone] office [phone] cell  I received 3 VMs from patient today; I called her back and THOROUGHLY reviewed instructions for CTA tomorrow. Pt verbalized understanding   She has reported low BPs for the last week and I advised her not to take 100mg  metoprolol for CTA. Pt verbalized understanding  Pt denies covid.

## 2019-04-13 ENCOUNTER — Ambulatory Visit (HOSPITAL_COMMUNITY): Payer: Medicare Other

## 2019-04-13 ENCOUNTER — Ambulatory Visit (HOSPITAL_COMMUNITY)
Admission: RE | Admit: 2019-04-13 | Discharge: 2019-04-13 | Disposition: A | Discharge status: Home / Self Care | Payer: Medicare Other | Source: Ambulatory Visit | Attending: Internal Medicine | Admitting: Internal Medicine

## 2019-04-13 ENCOUNTER — Telehealth: Payer: Self-pay | Admitting: Internal Medicine

## 2019-04-13 ENCOUNTER — Other Ambulatory Visit: Payer: Self-pay

## 2019-04-13 DIAGNOSIS — I480 Paroxysmal atrial fibrillation: Secondary | ICD-10-CM | POA: Insufficient documentation

## 2019-04-13 LAB — POCT I-STAT CREATININE: Creatinine, Ser: 0.5 mg/dL (ref 0.44–1.00)

## 2019-04-13 MED ORDER — NITROGLYCERIN 0.4 MG SL SUBL
0.8000 mg | SUBLINGUAL_TABLET | Freq: Once | SUBLINGUAL | Status: DC
  Filled 2019-04-13: qty 25

## 2019-04-13 MED ORDER — IOHEXOL 350 MG/ML SOLN
100.0000 mL | Freq: Once | INTRAVENOUS | Status: AC | PRN
  Administered 2019-04-13: 10:00:00 100 mL via INTRAVENOUS

## 2019-04-13 NOTE — Telephone Encounter (Signed)
Returned call to Pt.  Advised where she could find her instruction letter on her mychart app.  Also verbally gave instruction.

## 2019-04-13 NOTE — Telephone Encounter (Signed)
Patient is calling about her upcoming cardiac cath, she has some questions would like a call back.

## 2019-04-14 ENCOUNTER — Other Ambulatory Visit
Admission: RE | Admit: 2019-04-14 | Discharge: 2019-04-14 | Disposition: A | Discharge status: Home / Self Care | Payer: Medicare Other | Source: Ambulatory Visit | Attending: Internal Medicine | Admitting: Internal Medicine

## 2019-04-14 DIAGNOSIS — I480 Paroxysmal atrial fibrillation: Secondary | ICD-10-CM

## 2019-04-14 DIAGNOSIS — Z01810 Encounter for preprocedural cardiovascular examination: Secondary | ICD-10-CM

## 2019-04-14 DIAGNOSIS — Z01812 Encounter for preprocedural laboratory examination: Secondary | ICD-10-CM | POA: Diagnosis present

## 2019-04-14 DIAGNOSIS — Z20828 Contact with and (suspected) exposure to other viral communicable diseases: Secondary | ICD-10-CM | POA: Insufficient documentation

## 2019-04-14 LAB — CBC WITH DIFFERENTIAL/PLATELET
Abs Immature Granulocytes: 0.01 10*3/uL (ref 0.00–0.07)
Basophils Absolute: 0 10*3/uL (ref 0.0–0.1)
Basophils Relative: 1 %
Eosinophils Absolute: 0.4 10*3/uL (ref 0.0–0.5)
Eosinophils Relative: 7 %
HCT: 40.4 % (ref 36.0–46.0)
Hemoglobin: 13.1 g/dL (ref 12.0–15.0)
Immature Granulocytes: 0 %
Lymphocytes Relative: 37 %
Lymphs Abs: 1.8 10*3/uL (ref 0.7–4.0)
MCH: 29.7 pg (ref 26.0–34.0)
MCHC: 32.4 g/dL (ref 30.0–36.0)
MCV: 91.6 fL (ref 80.0–100.0)
Monocytes Absolute: 0.8 10*3/uL (ref 0.1–1.0)
Monocytes Relative: 15 %
Neutro Abs: 1.9 10*3/uL (ref 1.7–7.7)
Neutrophils Relative %: 40 %
Platelets: 304 10*3/uL (ref 150–400)
RBC: 4.41 MIL/uL (ref 3.87–5.11)
RDW: 14.5 % (ref 11.5–15.5)
WBC: 4.9 10*3/uL (ref 4.0–10.5)
nRBC: 0 % (ref 0.0–0.2)

## 2019-04-14 LAB — BASIC METABOLIC PANEL
Anion gap: 6 (ref 5–15)
BUN: 19 mg/dL (ref 8–23)
CO2: 28 mmol/L (ref 22–32)
Calcium: 8.7 mg/dL — ABNORMAL LOW (ref 8.9–10.3)
Chloride: 105 mmol/L (ref 98–111)
Creatinine, Ser: 0.59 mg/dL (ref 0.44–1.00)
GFR calc Af Amer: >60 mL/min (ref >60)
GFR calc non Af Amer: >60 mL/min (ref >60)
Glucose, Bld: 92 mg/dL (ref 70–99)
Potassium: 4.7 mmol/L (ref 3.5–5.1)
Sodium: 139 mmol/L (ref 135–145)

## 2019-04-15 LAB — SARS CORONAVIRUS 2 (TAT 6-24 HRS): SARS Coronavirus 2: NEGATIVE

## 2019-04-18 ENCOUNTER — Encounter (HOSPITAL_COMMUNITY)
Admission: RE | Disposition: A | Discharge status: Home / Self Care | Payer: Medicare Other | Source: Ambulatory Visit | Attending: Internal Medicine

## 2019-04-18 ENCOUNTER — Other Ambulatory Visit: Payer: Self-pay

## 2019-04-18 ENCOUNTER — Ambulatory Visit (HOSPITAL_COMMUNITY): Payer: Medicare Other | Admitting: Certified Registered"

## 2019-04-18 ENCOUNTER — Ambulatory Visit (HOSPITAL_COMMUNITY)
Admission: RE | Admit: 2019-04-18 | Discharge: 2019-04-19 | Disposition: A | Discharge status: Home / Self Care | Payer: Medicare Other | Source: Ambulatory Visit | Attending: Internal Medicine | Admitting: Internal Medicine

## 2019-04-18 ENCOUNTER — Encounter (HOSPITAL_COMMUNITY): Payer: Self-pay | Admitting: Certified Registered"

## 2019-04-18 DIAGNOSIS — E079 Disorder of thyroid, unspecified: Secondary | ICD-10-CM | POA: Diagnosis not present

## 2019-04-18 DIAGNOSIS — Q211 Atrial septal defect: Secondary | ICD-10-CM | POA: Insufficient documentation

## 2019-04-18 DIAGNOSIS — Z7901 Long term (current) use of anticoagulants: Secondary | ICD-10-CM | POA: Insufficient documentation

## 2019-04-18 DIAGNOSIS — F419 Anxiety disorder, unspecified: Secondary | ICD-10-CM | POA: Diagnosis not present

## 2019-04-18 DIAGNOSIS — Z8673 Personal history of transient ischemic attack (TIA), and cerebral infarction without residual deficits: Secondary | ICD-10-CM | POA: Insufficient documentation

## 2019-04-18 DIAGNOSIS — Z881 Allergy status to other antibiotic agents status: Secondary | ICD-10-CM | POA: Insufficient documentation

## 2019-04-18 DIAGNOSIS — Z79899 Other long term (current) drug therapy: Secondary | ICD-10-CM | POA: Insufficient documentation

## 2019-04-18 DIAGNOSIS — Z79890 Hormone replacement therapy (postmenopausal): Secondary | ICD-10-CM | POA: Insufficient documentation

## 2019-04-18 DIAGNOSIS — I4892 Unspecified atrial flutter: Secondary | ICD-10-CM

## 2019-04-18 DIAGNOSIS — I4819 Other persistent atrial fibrillation: Secondary | ICD-10-CM | POA: Diagnosis present

## 2019-04-18 DIAGNOSIS — F329 Major depressive disorder, single episode, unspecified: Secondary | ICD-10-CM | POA: Diagnosis not present

## 2019-04-18 DIAGNOSIS — I472 Ventricular tachycardia: Secondary | ICD-10-CM | POA: Insufficient documentation

## 2019-04-18 HISTORY — PX: ATRIAL FIBRILLATION ABLATION: EP1191

## 2019-04-18 SURGERY — ATRIAL FIBRILLATION ABLATION
Anesthesia: General

## 2019-04-18 MED ORDER — PROTAMINE SULFATE 10 MG/ML IV SOLN
INTRAVENOUS | Status: DC | PRN
  Administered 2019-04-18: 40 mg via INTRAVENOUS

## 2019-04-18 MED ORDER — SODIUM CHLORIDE 0.9 % IV SOLN
INTRAVENOUS | Status: DC | PRN
  Administered 2019-04-18: 10 ug/min via INTRAVENOUS

## 2019-04-18 MED ORDER — ACETAMINOPHEN 325 MG PO TABS
ORAL_TABLET | ORAL | Status: AC
  Filled 2019-04-18: qty 2

## 2019-04-18 MED ORDER — ISOPROTERENOL HCL 0.2 MG/ML IJ SOLN
INTRAVENOUS | Status: DC | PRN
  Administered 2019-04-18: 20 ug/min via INTRAVENOUS

## 2019-04-18 MED ORDER — SODIUM CHLORIDE 0.9% FLUSH: 3.0000 mL | INTRAVENOUS | Status: DC | PRN

## 2019-04-18 MED ORDER — HEPARIN SODIUM (PORCINE) 1000 UNIT/ML IJ SOLN
INTRAMUSCULAR | Status: AC
  Filled 2019-04-18: qty 1

## 2019-04-18 MED ORDER — APIXABAN 5 MG PO TABS
5.0000 mg | ORAL_TABLET | ORAL | Status: AC
  Administered 2019-04-18: 5 mg via ORAL
  Filled 2019-04-18: qty 1

## 2019-04-18 MED ORDER — BUPIVACAINE HCL (PF) 0.25 % IJ SOLN
INTRAMUSCULAR | Status: AC
  Filled 2019-04-18: qty 30

## 2019-04-18 MED ORDER — LIDOCAINE 2% (20 MG/ML) 5 ML SYRINGE
INTRAMUSCULAR | Status: DC | PRN
  Administered 2019-04-18: 20 mg via INTRAVENOUS

## 2019-04-18 MED ORDER — DULOXETINE HCL 30 MG PO CPEP
30.0000 mg | ORAL_CAPSULE | Freq: Every day | ORAL | Status: DC
  Administered 2019-04-19: 30 mg via ORAL
  Filled 2019-04-18: qty 1

## 2019-04-18 MED ORDER — ACETAMINOPHEN 500 MG PO TABS
1000.0000 mg | ORAL_TABLET | Freq: Once | ORAL | Status: DC
  Filled 2019-04-18: qty 2

## 2019-04-18 MED ORDER — PRAMIPEXOLE DIHYDROCHLORIDE 0.25 MG PO TABS
0.2500 mg | ORAL_TABLET | Freq: Every morning | ORAL | Status: DC
  Administered 2019-04-19: 0.25 mg via ORAL
  Filled 2019-04-18: qty 1

## 2019-04-18 MED ORDER — DEXAMETHASONE SODIUM PHOSPHATE 10 MG/ML IJ SOLN
INTRAMUSCULAR | Status: DC | PRN
  Administered 2019-04-18: 5 mg via INTRAVENOUS

## 2019-04-18 MED ORDER — ISOPROTERENOL HCL 0.2 MG/ML IJ SOLN
INTRAMUSCULAR | Status: AC
  Filled 2019-04-18: qty 5

## 2019-04-18 MED ORDER — TIMOLOL MALEATE 0.5 % OP SOLN
1.0000 [drp] | Freq: Every day | OPHTHALMIC | Status: DC
  Administered 2019-04-19: 1 [drp] via OPHTHALMIC
  Filled 2019-04-18: qty 5

## 2019-04-18 MED ORDER — ONDANSETRON HCL 4 MG/2ML IJ SOLN
INTRAMUSCULAR | Status: DC | PRN
  Administered 2019-04-18: 4 mg via INTRAVENOUS

## 2019-04-18 MED ORDER — CYCLOBENZAPRINE HCL 5 MG PO TABS: 5.0000 mg | ORAL_TABLET | Freq: Every evening | ORAL | Status: DC | PRN

## 2019-04-18 MED ORDER — HEPARIN (PORCINE) IN NACL 1000-0.9 UT/500ML-% IV SOLN
INTRAVENOUS | Status: DC | PRN
  Administered 2019-04-18: 500 mL

## 2019-04-18 MED ORDER — PHENYLEPHRINE 40 MCG/ML (10ML) SYRINGE FOR IV PUSH (FOR BLOOD PRESSURE SUPPORT)
PREFILLED_SYRINGE | INTRAVENOUS | Status: DC | PRN
  Administered 2019-04-18: 80 ug via INTRAVENOUS
  Administered 2019-04-18: 120 ug via INTRAVENOUS

## 2019-04-18 MED ORDER — ROCURONIUM BROMIDE 10 MG/ML (PF) SYRINGE
PREFILLED_SYRINGE | INTRAVENOUS | Status: DC | PRN
  Administered 2019-04-18: 20 mg via INTRAVENOUS
  Administered 2019-04-18: 50 mg via INTRAVENOUS
  Administered 2019-04-18 (×2): 20 mg via INTRAVENOUS

## 2019-04-18 MED ORDER — BUPIVACAINE HCL (PF) 0.25 % IJ SOLN
INTRAMUSCULAR | Status: DC | PRN
  Administered 2019-04-18: 25 mL

## 2019-04-18 MED ORDER — SODIUM CHLORIDE 0.9% FLUSH
3.0000 mL | Freq: Two times a day (BID) | INTRAVENOUS | Status: DC
  Administered 2019-04-18: 3 mL via INTRAVENOUS

## 2019-04-18 MED ORDER — HEPARIN SODIUM (PORCINE) 1000 UNIT/ML IJ SOLN
INTRAMUSCULAR | Status: DC | PRN
  Administered 2019-04-18: 12000 [IU] via INTRAVENOUS
  Administered 2019-04-18: 1000 [IU] via INTRAVENOUS

## 2019-04-18 MED ORDER — SODIUM CHLORIDE 0.9 % IV SOLN
INTRAVENOUS | Status: DC
  Administered 2019-04-18 (×2): via INTRAVENOUS

## 2019-04-18 MED ORDER — PROPOFOL 10 MG/ML IV BOLUS
INTRAVENOUS | Status: DC | PRN
  Administered 2019-04-18: 50 mg via INTRAVENOUS
  Administered 2019-04-18: 100 mg via INTRAVENOUS

## 2019-04-18 MED ORDER — SUGAMMADEX SODIUM 200 MG/2ML IV SOLN
INTRAVENOUS | Status: DC | PRN
  Administered 2019-04-18: 150 mg via INTRAVENOUS

## 2019-04-18 MED ORDER — TRAMADOL HCL 50 MG PO TABS
50.0000 mg | ORAL_TABLET | Freq: Four times a day (QID) | ORAL | Status: DC | PRN
  Administered 2019-04-18: 50 mg via ORAL
  Filled 2019-04-18: qty 1

## 2019-04-18 MED ORDER — LEVOTHYROXINE SODIUM 25 MCG PO TABS
25.0000 ug | ORAL_TABLET | Freq: Every day | ORAL | Status: DC
  Administered 2019-04-19: 25 ug via ORAL
  Filled 2019-04-18: qty 1

## 2019-04-18 MED ORDER — HEPARIN (PORCINE) IN NACL 1000-0.9 UT/500ML-% IV SOLN
INTRAVENOUS | Status: AC
  Filled 2019-04-18: qty 500

## 2019-04-18 MED ORDER — APIXABAN 5 MG PO TABS
5.0000 mg | ORAL_TABLET | Freq: Two times a day (BID) | ORAL | Status: DC
  Administered 2019-04-18 – 2019-04-19 (×2): 5 mg via ORAL
  Filled 2019-04-18 (×2): qty 1

## 2019-04-18 MED ORDER — ACETAMINOPHEN 500 MG PO TABS
ORAL_TABLET | ORAL | Status: AC
  Administered 2019-04-18: 1000 mg
  Filled 2019-04-18: qty 2

## 2019-04-18 MED ORDER — SODIUM CHLORIDE 0.9 % IV SOLN: 250.0000 mL | INTRAVENOUS | Status: DC | PRN

## 2019-04-18 MED ORDER — TIZANIDINE HCL 2 MG PO TABS
1.0000 mg | ORAL_TABLET | Freq: Every evening | ORAL | Status: DC | PRN
  Filled 2019-04-18: qty 1

## 2019-04-18 MED ORDER — FENTANYL CITRATE (PF) 250 MCG/5ML IJ SOLN
INTRAMUSCULAR | Status: DC | PRN
  Administered 2019-04-18: 100 ug via INTRAVENOUS

## 2019-04-18 MED ORDER — HEPARIN SODIUM (PORCINE) 1000 UNIT/ML IJ SOLN
INTRAMUSCULAR | Status: DC | PRN
  Administered 2019-04-18: 5000 [IU] via INTRAVENOUS

## 2019-04-18 MED ORDER — ACETAMINOPHEN 325 MG PO TABS
650.0000 mg | ORAL_TABLET | Freq: Once | ORAL | Status: AC
  Administered 2019-04-18: 650 mg via ORAL

## 2019-04-18 MED ORDER — ONDANSETRON HCL 4 MG/2ML IJ SOLN: 4.0000 mg | Freq: Four times a day (QID) | INTRAMUSCULAR | Status: DC | PRN

## 2019-04-18 MED ORDER — AMPHETAMINE-DEXTROAMPHET ER 10 MG PO CP24
10.0000 mg | ORAL_CAPSULE | Freq: Every day | ORAL | Status: DC
  Administered 2019-04-19: 10 mg via ORAL
  Filled 2019-04-18: qty 1

## 2019-04-18 SURGICAL SUPPLY — 18 items
BLANKET WARM UNDERBOD FULL ACC (MISCELLANEOUS) ×3 IMPLANT
CATH MAPPNG PENTARAY F 2-6-2MM (CATHETERS) IMPLANT
CATH SMTCH THERMOCOOL SF DF (CATHETERS) ×2 IMPLANT
CATH SOUNDSTAR ECO REPROCESSED (CATHETERS) ×2 IMPLANT
CATH WEBSTER BI DIR CS D-F CRV (CATHETERS) ×2 IMPLANT
COVER SWIFTLINK CONNECTOR (BAG) ×3 IMPLANT
NDL BAYLIS TRANSSEPTAL 71CM (NEEDLE) IMPLANT
NEEDLE BAYLIS TRANSSEPTAL 71CM (NEEDLE) ×3 IMPLANT
PACK EP LATEX FREE (CUSTOM PROCEDURE TRAY) ×2
PACK EP LF (CUSTOM PROCEDURE TRAY) ×1 IMPLANT
PAD PRO RADIOLUCENT 2001M-C (PAD) ×3 IMPLANT
PATCH CARTO3 (PAD) ×2 IMPLANT
PENTARAY F 2-6-2MM (CATHETERS) ×3
SHEATH AVANTI 11F 11CM (SHEATH) ×2 IMPLANT
SHEATH PINNACLE 7F 10CM (SHEATH) ×4 IMPLANT
SHEATH PROBE COVER 6X72 (BAG) ×2 IMPLANT
SHEATH SWARTZ TS SL2 63CM 8.5F (SHEATH) ×2 IMPLANT
TUBING SMART ABLATE COOLFLOW (TUBING) ×2 IMPLANT

## 2019-04-18 NOTE — Anesthesia Preprocedure Evaluation (Addendum)
Anesthesia Evaluation  Patient identified by MRN, date of birth, ID band Patient awake    Reviewed: Allergy & Precautions, NPO status , Patient's Chart, lab work & pertinent test results  Airway Mallampati: II  TM Distance: >3 FB Neck ROM: Full    Dental no notable dental hx.    Pulmonary sleep apnea ,    Pulmonary exam normal breath sounds clear to auscultation       Cardiovascular Normal cardiovascular exam+ dysrhythmias Atrial Fibrillation  Rhythm:Regular Rate:Normal     Neuro/Psych PSYCHIATRIC DISORDERS Anxiety Depression CVA    GI/Hepatic negative GI ROS, Neg liver ROS,   Endo/Other  Hypothyroidism   Renal/GU negative Renal ROS     Musculoskeletal negative musculoskeletal ROS (+)   Abdominal   Peds  Hematology negative hematology ROS (+)   Anesthesia Other Findings A-fib  Reproductive/Obstetrics                            Anesthesia Physical Anesthesia Plan  ASA: III  Anesthesia Plan: General   Post-op Pain Management:    Induction: Intravenous  PONV Risk Score and Plan: 3 and Ondansetron, Dexamethasone, Midazolam and Treatment may vary due to age or medical condition  Airway Management Planned: Oral ETT  Additional Equipment:   Intra-op Plan:   Post-operative Plan: Extubation in OR  Informed Consent: I have reviewed the patients History and Physical, chart, labs and discussed the procedure including the risks, benefits and alternatives for the proposed anesthesia with the patient or authorized representative who has indicated his/her understanding and acceptance.     Dental advisory given  Plan Discussed with: CRNA  Anesthesia Plan Comments:         Anesthesia Quick Evaluation

## 2019-04-18 NOTE — Anesthesia Procedure Notes (Signed)
Procedure Name: Intubation Date/Time: 04/18/2019 1:58 PM Performed by: Imagene Riches, CRNA Pre-anesthesia Checklist: Patient identified, Emergency Drugs available, Suction available and Patient being monitored Patient Re-evaluated:Patient Re-evaluated prior to induction Oxygen Delivery Method: Circle System Utilized Preoxygenation: Pre-oxygenation with 100% oxygen Induction Type: IV induction Ventilation: Mask ventilation without difficulty Laryngoscope Size: Glidescope and 3 Grade View: Grade I Tube type: Oral Tube size: 7.0 mm Number of attempts: 2 Airway Equipment and Method: Stylet and Oral airway Placement Confirmation: ETT inserted through vocal cords under direct vision,  positive ETCO2 and breath sounds checked- equal and bilateral Secured at: 22 cm Tube secured with: Tape Dental Injury: Teeth and Oropharynx as per pre-operative assessment  Comments: DL x1 with miller 2, grade 3 view. No attempt to pass ETT. Second DL with glide scope 3, grade 1 view.

## 2019-04-18 NOTE — Discharge Summary (Addendum)
ELECTROPHYSIOLOGY PROCEDURE DISCHARGE SUMMARY    Patient ID: Monica Whitaker,  MRN: [id number], DOB/AGE: 08-28-1945 73 y.o.  Admit date: 04/18/2019 Discharge date: 04/19/19   Primary Care Physician: Jearld Fenton, NP  Primary Cardiologist: No primary care provider on file.  Electrophysiologist: Dr. Rayann Heman  Primary Discharge Diagnosis:  Paroxysmal Atrial Fibrillation  Secondary Discharge Diagnosis:  Anxiety H/o CVA  Procedures This Admission:  1.  Electrophysiology study and radiofrequency catheter ablation of Atrial Fibrillation on 04/19/19 by Dr. Rayann Heman.   This study demonstrated  I. Sinus rhythm upon presentation II. Intracardiac echo reveals a moderate to large sized left atrium. Aneurysmal septum with PFO noted III. All 4 PVs were successfully isolated with a prior ablation procedure.  As the prior ablation was quite antral, elected to perform WACA with anatomical encircling of all four pulmonary veins with radiofrequency current IV. Additional mapping and ablation within the left atrium due to persistence of atrial fibrillation with a posterior wall box demonstrated V. Cavo-tricuspid isthmus ablation performed with complete bidirectional isthmus block achieved VI. No inducible arrhythmias following ablation with isuprel VII.  No early apparent complications.   Brief HPI: Monica Whitaker is a 73 y.o. female with a history of recurrent and symptomatic paroxysmal Atrial Fibrillation.  She has failed medical therapy with flecainide and tikosyn. She previously underwent ablation 2014 Risks, benefits, and alternatives to catheter ablation of Atrial Fibrillation were reviewed with the patient who wished to proceed.  The patient underwent intracardiac Echo during the procedure which demonstrated normal LV function and no LAA thrombus.    Hospital Course:  The patient was admitted and underwent EPS/RFCA of Atrial Fibrillation with details as outlined above.  They were monitored  on telemetry overnight which demonstrated NSR/Sinus tach 80-110s.  Groin was without complication on the day of discharge.  The patient was examined and considered to be stable for discharge.  Wound care and restrictions were reviewed with the patient.  The patient will be seen back by Roderic Palau, NP in 4 weeks and Dr. Rayann Heman in 12 weeks for post ablation follow up.   This patients CHA2DS2-VASc Score and unadjusted Ischemic Stroke Rate (% per year) is equal to 4.8 % stroke rate/year from a score of 4 Above score calculated as 1 point each if present [CHF, HTN, DM, Vascular=MI/PAD/Aortic Plaque, Age if 65-74, or Female] Above score calculated as 2 points each if present [Age > 75, or Stroke/TIA/TE]   Physical Exam: Vitals:   04/18/19 2230 04/19/19 0012 04/19/19 0524 04/19/19 0733  BP: 114/72 124/65 (!) 108/55 108/64  Pulse: 73 74 80 73  Resp:  18 18 16   Temp:  98.3 F (36.8 C) 97.9 F (36.6 C) 97.8 F (36.6 C)  TempSrc:  Oral Oral Oral  SpO2:  96% 92% 97%  Weight:   77 kg   Height:        GEN- The patient is well appearing, alert and oriented x 3 today.   HEENT: normocephalic, atraumatic; sclera clear, conjunctiva pink; hearing intact; oropharynx clear; neck supple  Lungs- Clear to ausculation bilaterally, normal work of breathing.  No wheezes, rales, rhonchi Heart- Regular rate and rhythm, no murmurs, rubs or gallops  GI- soft, non-tender, non-distended, bowel sounds present  Extremities- no clubbing, cyanosis, or edema; DP/PT/radial pulses 2+ bilaterally, groin with small, central, firm hematoma. No bruit. MS- no significant deformity or atrophy Skin- warm and dry, no rash or lesion Psych- euthymic mood, full affect Neuro- strength and sensation are intact  Labs:   Lab Results  Component Value Date   WBC 4.9 04/14/2019   HGB 13.1 04/14/2019   HCT 40.4 04/14/2019   MCV 91.6 04/14/2019   PLT 304 04/14/2019    Recent Labs  Lab 04/14/19 1042  NA 139  K 4.7  CL 105   CO2 28  BUN 19  CREATININE 0.59  CALCIUM 8.7*  GLUCOSE 92     Discharge Medications:  Allergies as of 04/19/2019      Reactions   Darvon Other (See Comments)   Severe panic attacks   Propoxyphene Other (See Comments)   GI Upset Severe panic attacks GI Upset   Propoxyphene N-acetaminophen Other (See Comments)   Severe panic attacks   Levofloxacin Anxiety, Other (See Comments)   Causes panic attacks.      Medication List    TAKE these medications   acetaminophen 500 MG tablet Commonly known as: TYLENOL Take 1,000 mg by mouth every 6 (six) hours as needed (pain). Take 2 tablets by mouth every 6 hours as needed for pain   amphetamine-dextroamphetamine 10 MG 24 hr capsule Commonly known as: ADDERALL XR Take 1 capsule (10 mg total) by mouth daily.   apixaban 5 MG Tabs tablet Commonly known as: Eliquis Take 1 tablet (5 mg total) by mouth 2 (two) times daily.   BUFFERED SALT PO Take 1 capsule by mouth daily as needed (Electrolyte/Salt supplement).   calcium carbonate 600 MG Tabs tablet Commonly known as: OS-CAL Take 600 mg by mouth daily with breakfast.   cholecalciferol 1000 units tablet Commonly known as: VITAMIN D Take 2,000 Units by mouth daily.   cyclobenzaprine 10 MG tablet Commonly known as: FLEXERIL Take 0.5-1 tablets (5-10 mg total) by mouth at bedtime as needed for muscle spasms.   diltiazem 240 MG 24 hr capsule Commonly known as: CARDIZEM CD TAKE 1 CAPSULE (240 MG TOTAL) BY MOUTH DAILY. MAY TAKE EXTRA CAPSULE DAILY AS NEEDED FOR AFIB   diltiazem 30 MG tablet Commonly known as: Cardizem Ttake 1 tablet every 4 hours AS NEEDED for rapid afib heart rate over 100   DULoxetine 30 MG capsule Commonly known as: CYMBALTA TAKE 1 CAPSULE BY MOUTH EVERY DAY along with 60 mg capsule   DULoxetine 60 MG capsule Commonly known as: CYMBALTA Take 1 capsule (60 mg total) by mouth daily. Take with 30 mg duloxetine   estradiol 0.1 MG/GM vaginal cream Commonly known  as: ESTRACE VAGINAL Apply 0.5mg  (pea-sized amount)  just inside the vaginal introitus with a finger-tip on Monday, Wednesday and Friday nights.   flecainide 100 MG tablet Commonly known as: TAMBOCOR TAKE 1 TABLET BY MOUTH TWICE A DAY   furosemide 20 MG tablet Commonly known as: Lasix Take 0.5 tablets (10 mg total) by mouth as needed for edema.   levothyroxine 25 MCG tablet Commonly known as: SYNTHROID TAKE 1 TABLET BY MOUTH EVERY DAY   multivitamin tablet Take 1 tablet by mouth daily.   pantoprazole 40 MG tablet Commonly known as: PROTONIX Take 1 tablet (40 mg total) by mouth daily. For 6 weeks   pramipexole 0.125 MG tablet Commonly known as: MIRAPEX TAKE 2 TABLETS BY MOUTH EVERY MORNING   Prolia 60 MG/ML Soln injection Generic drug: denosumab Inject 60 mg into the skin every 6 (six) months.   Timoptic 0.5 % ophthalmic solution Generic drug: timolol Place 1 drop into the left eye daily.   tiZANidine 2 MG tablet Commonly known as: ZANAFLEX TAKE 1/2-1 TABLETS (1-2 MG TOTAL) BY MOUTH  AT BEDTIME. What changed: See the new instructions.   vitamin B-12 1000 MCG tablet Commonly known as: CYANOCOBALAMIN Take 1,000 mcg by mouth daily.   vitamin C 500 MG tablet Commonly known as: ASCORBIC ACID Take 500 mg by mouth daily.   vitamin E 400 UNIT capsule Take 400 Units by mouth daily.       Disposition:   Follow-up Information    Thompson Grayer, MD Follow up on 07/17/2019.   Specialty: Cardiology Why: at 1030 am for 3 month post ablation follow up Contact information: [place] [address] [phone]        Sherran Needs, NP Follow up on 05/16/2019.   Specialties: Nurse Practitioner, Cardiology Why: at 1030 for 1 month post ablation follow up Contact information: [place] [address] [phone]           Duration of Discharge Encounter: Greater than 30 minutes including physician time.  Signed, Shirley Friar, PA-C  04/19/2019 9:19 AM  I have seen, examined the patient, and reviewed the above assessment and plan.  Changes to above are made where necessary.  On exam, RRR.  Doing well s/p PVI.  Routine wound care and followup.   DC to home.  Co Sign: Thompson Grayer, MD 04/19/2019 11:32 AM

## 2019-04-18 NOTE — Interval H&P Note (Signed)
History and Physical Interval Note:  04/18/2019 1:24 PM  Monica Whitaker  has presented today for surgery, with the diagnosis of afib.  The various methods of treatment have been discussed with the patient and family. After consideration of risks, benefits and other options for treatment, the patient has consented to  Procedure(s): ATRIAL FIBRILLATION ABLATION (N/A) as a surgical intervention.  The patient's history has been reviewed, patient examined, no change in status, stable for surgery.  I have reviewed the patient's chart and labs.  Questions were answered to the patient's satisfaction.    Cardiac CT and echo reviewed with patient. She reports compliance with eliquis without interruption   Thompson Grayer

## 2019-04-18 NOTE — Interval H&P Note (Signed)
History and Physical Interval Note:  04/18/2019 11:49 AM  Monica Whitaker  has presented today for surgery, with the diagnosis of afib.  The various methods of treatment have been discussed with the patient and family. After consideration of risks, benefits and other options for treatment, the patient has consented to  Procedure(s): ATRIAL FIBRILLATION ABLATION (N/A) as a surgical intervention.  The patient's history has been reviewed, patient examined, no change in status, stable for surgery.  I have reviewed the patient's chart and labs and relayed any necessary information to Dr. Rayann Heman.  Questions were answered to the patient's satisfaction.     Shirley Friar, PA-C

## 2019-04-18 NOTE — Progress Notes (Signed)
Site area: rt groin 3-way stopcock and suture removed Site Prior to Removal:  Level 0 Pressure Applied For: 5 minutes Manual:   yes Patient Status During Pull:  stable Post Pull Site:  Level 0 Post Pull Instructions Given:  yes Post Pull Pulses Present: rt dp palpable Dressing Applied:  Gauze AND TEGADERM Bedrest begins @ 1600 Comments:  IV saline locked

## 2019-04-18 NOTE — Transfer of Care (Signed)
Immediate Anesthesia Transfer of Care Note  Patient: Monica Whitaker  Procedure(s) Performed: ATRIAL FIBRILLATION ABLATION (N/A )  Patient Location: Cath Lab  Anesthesia Type:General  Level of Consciousness: awake, alert  and oriented  Airway & Oxygen Therapy: Patient Spontanous Breathing and Patient connected to nasal cannula oxygen  Post-op Assessment: Report given to RN and Post -op Vital signs reviewed and stable  Post vital signs: Reviewed and stable  Last Vitals:  Vitals Value Taken Time  BP 103/69 04/18/19 1616  Temp 36.9 C 04/18/19 1616  Pulse 82 04/18/19 1616  Resp 14 04/18/19 1616  SpO2 96 % 04/18/19 1616    Last Pain:  Vitals:   04/18/19 1616  TempSrc: Tympanic  PainSc: 0-No pain         Complications: No apparent anesthesia complications

## 2019-04-19 ENCOUNTER — Other Ambulatory Visit: Payer: Self-pay

## 2019-04-19 ENCOUNTER — Encounter (HOSPITAL_COMMUNITY): Payer: Self-pay | Admitting: Internal Medicine

## 2019-04-19 DIAGNOSIS — Q211 Atrial septal defect: Secondary | ICD-10-CM | POA: Diagnosis not present

## 2019-04-19 DIAGNOSIS — I4819 Other persistent atrial fibrillation: Secondary | ICD-10-CM | POA: Diagnosis not present

## 2019-04-19 DIAGNOSIS — F419 Anxiety disorder, unspecified: Secondary | ICD-10-CM | POA: Diagnosis not present

## 2019-04-19 DIAGNOSIS — Z8673 Personal history of transient ischemic attack (TIA), and cerebral infarction without residual deficits: Secondary | ICD-10-CM | POA: Diagnosis not present

## 2019-04-19 LAB — POCT ACTIVATED CLOTTING TIME: Activated Clotting Time: 246 seconds

## 2019-04-19 MED ORDER — PANTOPRAZOLE SODIUM 40 MG PO TBEC: 40.0000 mg | DELAYED_RELEASE_TABLET | Freq: Every day | ORAL | 0 refills | Status: DC

## 2019-04-19 MED ORDER — DULOXETINE HCL 60 MG PO CPEP: 90.0000 mg | ORAL_CAPSULE | Freq: Every day | ORAL | Status: DC

## 2019-04-19 MED ORDER — MELATONIN 3 MG PO TABS
3.0000 mg | ORAL_TABLET | Freq: Every day | ORAL | Status: DC
  Administered 2019-04-19: 3 mg via ORAL
  Filled 2019-04-19: qty 1

## 2019-04-19 MED ORDER — PANTOPRAZOLE SODIUM 40 MG PO TBEC
40.0000 mg | DELAYED_RELEASE_TABLET | Freq: Every day | ORAL | Status: DC
  Filled 2019-04-19: qty 1

## 2019-04-19 MED ORDER — ACETAMINOPHEN 500 MG PO TABS
500.0000 mg | ORAL_TABLET | Freq: Four times a day (QID) | ORAL | Status: DC | PRN
  Administered 2019-04-19: 500 mg via ORAL
  Filled 2019-04-19: qty 1

## 2019-04-19 MED ORDER — DULOXETINE HCL 60 MG PO CPEP
60.0000 mg | ORAL_CAPSULE | ORAL | Status: DC
  Filled 2019-04-19: qty 1

## 2019-04-19 NOTE — Discharge Instructions (Signed)
Cardiac Ablation, Care After °This sheet gives you information about how to care for yourself after your procedure. Your health care provider may also give you more specific instructions. If you have problems or questions, contact your health care provider. °What can I expect after the procedure? °After the procedure, it is common to have: °· Bruising around your puncture site. °· Tenderness around your puncture site. °· Skipped heartbeats. °· Tiredness (fatigue). ° °Follow these instructions at home: °Puncture site care  °· Follow instructions from your health care provider about how to take care of your puncture site. Make sure you: °? If present, leave stitches (sutures), skin glue, or adhesive strips in place. These skin closures may need to stay in place for up to 2 weeks. If adhesive strip edges start to loosen and curl up, you may trim the loose edges. Do not remove adhesive strips completely unless your health care provider tells you to do that. °· Check your puncture site every day for signs of infection. Check for: °? Redness, swelling, or pain. °? Fluid or blood. If your puncture site starts to bleed, lie down on your back, apply firm pressure to the area, and contact your health care provider. °? Warmth. °? Pus or a bad smell. °Driving °· Do not drive for at least 4 days after your procedure or however long your health care provider recommends. °· Do not drive or use heavy machinery while taking prescription pain medicine. °· Do not drive for 24 hours if you were given a medicine to help you relax (sedative) during your procedure. °Activity °· Avoid activities that take a lot of effort for at least 7 days after your procedure. °· Do not lift anything that is heavier than 5 lb (4.5 kg) for one week.  °· No sexual activity for 1 week.  °· Return to your normal activities as told by your health care provider. Ask your health care provider what activities are safe for you. °General instructions °· Take  over-the-counter and prescription medicines only as told by your health care provider. °· Do not use any products that contain nicotine or tobacco, such as cigarettes and e-cigarettes. If you need help quitting, ask your health care provider. °· You may shower after 24 hours, but Do not take baths, swim, or use a hot tub for 1 week.  °· Do not drink alcohol for 24 hours after your procedure. °· Keep all follow-up visits as told by your health care provider. This is important. °Contact a health care provider if: °· You have redness, mild swelling, or pain around your puncture site. °· You have fluid or blood coming from your puncture site that stops after applying firm pressure to the area. °· Your puncture site feels warm to the touch. °· You have pus or a bad smell coming from your puncture site. °· You have a fever. °· You have chest pain or discomfort that spreads to your neck, jaw, or arm. °· You are sweating a lot. °· You feel nauseous. °· You have a fast or irregular heartbeat. °· You have shortness of breath. °· You are dizzy or light-headed and feel the need to lie down. °· You have pain or numbness in the arm or leg closest to your puncture site. °Get help right away if: °· Your puncture site suddenly swells. °· Your puncture site is bleeding and the bleeding does not stop after applying firm pressure to the area. °These symptoms may represent a serious problem that   is an emergency. Do not wait to see if the symptoms will go away. Get medical help right away. Call your local emergency services (911 in the [place] Do not drive yourself to the hospital. Summary  After the procedure, it is normal to have bruising and tenderness at the puncture site in your groin, neck, or forearm.  Check your puncture site every day for signs of infection.  Get help right away if your puncture site is bleeding and the bleeding does not stop after applying firm pressure to the area. This is a medical emergency. This  information is not intended to replace advice given to you by your health care provider. Make sure you discuss any questions you have with your health care provide     Information on my medicine - ELIQUIS (apixaban)  Why was Eliquis prescribed for you? Eliquis was prescribed for you to reduce the risk of a blood clot forming that can cause a stroke if you have a medical condition called atrial fibrillation (a type of irregular heartbeat).  What do You need to know about Eliquis ? Take your Eliquis TWICE DAILY - one tablet in the morning and one tablet in the evening with or without food. If you have difficulty swallowing the tablet whole please discuss with your pharmacist how to take the medication safely.  Take Eliquis exactly as prescribed by your doctor and DO NOT stop taking Eliquis without talking to the doctor who prescribed the medication.  Stopping may increase your risk of developing a stroke.  Refill your prescription before you run out.  After discharge, you should have regular check-up appointments with your healthcare provider that is prescribing your Eliquis.  In the future your dose may need to be changed if your kidney function or weight changes by a significant amount or as you get older.  What do you do if you miss a dose? If you miss a dose, take it as soon as you remember on the same day and resume taking twice daily.  Do not take more than one dose of ELIQUIS at the same time to make up a missed dose.  Important Safety Information A possible side effect of Eliquis is bleeding. You should call your healthcare provider right away if you experience any of the following: ? Bleeding from an injury or your nose that does not stop. ? Unusual colored urine (red or dark brown) or unusual colored stools (red or black). ? Unusual bruising for unknown reasons. ? A serious fall or if you hit your head (even if there is no bleeding).  Some medicines may interact with  Eliquis and might increase your risk of bleeding or clotting while on Eliquis. To help avoid this, consult your healthcare provider or pharmacist prior to using any new prescription or non-prescription medications, including herbals, vitamins, non-steroidal anti-inflammatory drugs (NSAIDs) and supplements.  This website has more information on Eliquis (apixaban): http://www.eliquis.com/eliquis/home

## 2019-04-19 NOTE — Progress Notes (Signed)
Patient complains of a headache this morning. Her allergy list contraindicates tylenol. Patient states she takes "plain tylenol" at home regularly. PA notified. Verbal order placed for tylenol 500mg .

## 2019-04-19 NOTE — Progress Notes (Signed)
Pt has orders to be discharged. Discharge instructions given and pt has no additional questions at this time. Medication regimen reviewed and pt educated. Pt verbalized understanding and has no additional questions. Telemetry box removed. IV removed and site in good condition. Pt stable and left hospital. Order seen after patient was off the floor for an additional 60mg  of cymbalta. Voicemail left on patient's phone to take an additional 60mg  of cymbalta.

## 2019-04-19 NOTE — Anesthesia Postprocedure Evaluation (Signed)
Anesthesia Post Note  Patient: Monica Whitaker  Procedure(s) Performed: ATRIAL FIBRILLATION ABLATION (N/A )     Patient location during evaluation: PACU Anesthesia Type: General Level of consciousness: awake and alert Pain management: pain level controlled Vital Signs Assessment: post-procedure vital signs reviewed and stable Respiratory status: spontaneous breathing, nonlabored ventilation, respiratory function stable and patient connected to nasal cannula oxygen Cardiovascular status: blood pressure returned to baseline and stable Postop Assessment: no apparent nausea or vomiting Anesthetic complications: no    Last Vitals:  Vitals:   04/19/19 0524 04/19/19 0733  BP: (!) 108/55 108/64  Pulse: 80 73  Resp: 18 16  Temp: 36.6 C 36.6 C  SpO2: 92% 97%    Last Pain:  Vitals:   04/19/19 0800  TempSrc:   PainSc: 0-No pain                 Tiajuana Amass

## 2019-04-21 ENCOUNTER — Telehealth: Payer: Self-pay | Admitting: Internal Medicine

## 2019-04-21 NOTE — Telephone Encounter (Signed)
Pt states she had headache in the hospital as well. Never really had issues with headaches. Only new medication is Protonix - headache common side effect listed - Per Roderic Palau NP hold protonix over weekend and call on Monday with report. If symptoms resolve will switch to a different agent. Pt can use tylenol as directed. Denies visual changes, BP elevation, double vision. ER precautions reviewed. Pt in agreement.

## 2019-04-21 NOTE — Telephone Encounter (Signed)
New message:    Patient calling stating that she had ablation on Tuesday and patient is having really bad headaches. Please call patient back.

## 2019-04-24 ENCOUNTER — Telehealth: Payer: Self-pay | Admitting: Internal Medicine

## 2019-04-24 ENCOUNTER — Telehealth: Payer: Self-pay

## 2019-04-24 ENCOUNTER — Ambulatory Visit (INDEPENDENT_AMBULATORY_CARE_PROVIDER_SITE_OTHER): Payer: Medicare Other | Admitting: Family Medicine

## 2019-04-24 ENCOUNTER — Encounter: Payer: Self-pay | Admitting: Family Medicine

## 2019-04-24 VITALS — BP 125/77 | HR 88 | Temp 97.8°F | Ht 69.5 in | Wt 164.0 lb

## 2019-04-24 DIAGNOSIS — I950 Idiopathic hypotension: Secondary | ICD-10-CM

## 2019-04-24 DIAGNOSIS — I951 Orthostatic hypotension: Secondary | ICD-10-CM

## 2019-04-24 DIAGNOSIS — I63412 Cerebral infarction due to embolism of left middle cerebral artery: Secondary | ICD-10-CM

## 2019-04-24 DIAGNOSIS — I4819 Other persistent atrial fibrillation: Secondary | ICD-10-CM

## 2019-04-24 NOTE — Telephone Encounter (Signed)
Patient is being seen "virtually" by Dr. Lorelei Pont today due to fever and diarrhea.

## 2019-04-24 NOTE — Telephone Encounter (Signed)
[facility] REC[facility] Patient Name: Monica Whitaker Gender: Female DOB: 05/13/1946 Age: 73 Y 72 M 23 D Return Phone Number: [phone] (Secondary), [phone] (Alternate) Address: City/State/Zip: [place] [address] Client [place] Night - Client Client Site [place] Physician Webb Silversmith - NP Contact Type Call Who Is Calling Patient / Member / Family / Caregiver Call Type Triage / Clinical Relationship To Patient Self Return Phone Number [phone] (Secondary) Chief Complaint Fever (non urgent symptom) (> THREE MONTHS) Reason for Call Symptomatic / Request for [place] states she had cardiac ablation on Tuesday 04/18/2019. The caller has had a temp that has been 2 degrees above the caller's normal. Caller is taking Tylenol. Translation No Nurse Assessment Nurse: Hardin Negus, RN, Mardene Celeste Date/Time Eilene Ghazi Time): 04/22/2019 1:22:41 PM Confirm and document reason for call. If symptomatic, describe symptoms. ---Caller states she had cardiac ablation on Tuesday 04/18/2019. The caller has had a temp that has been 2 degrees above the caller's normal. Caller is taking Tylenol. 97,98.1,98.9, 98.1 (O) She has a sore throat but feels like it is from the tube. She has been able to eat and drink well. Has the patient had close contact with a person known or suspected to have the novel coronavirus illness OR traveled / lives in area with major community spread (including international travel) in the last 14 days from the onset of symptoms? * If Asymptomatic, screen for exposure and travel within the last 14 days. ---No Does the patient have any new or worsening symptoms? ---Yes Will a triage be completed? ---Yes Related visit to physician within the last 2 weeks? ---No Does the PT have any chronic conditions? (i.e.  diabetes, asthma, this includes High risk factors for pregnancy, etc.) ---No Is this a behavioral health or substance abuse call? ---No Guidelines Guideline Title Affirmed Question Affirmed Notes Nurse Date/Time (Eastern Time) Sore Throat [1] Sore throat is the only symptom AND [2] present > 48 hours Hardin Negus, RN, Mardene Celeste 04/22/2019 1:26:10 PM PLEASE NOTE: All timestamps contained within this report are represented as [place] Standard Time. CONFIDENTIALTY NOTICE: This fax transmission is intended only for the addressee. It contains information that is legally privileged, confidential or otherwise protected from use or disclosure. If you are not the intended recipient, you are strictly prohibited from reviewing, disclosing, copying using or disseminating any of this information or taking any action in reliance on or regarding this information. If you have received this fax in error, please notify us immediately by telephone so that we can arrange for its return to [place]. Phone: [phone], Toll-Free: [phone], Fax: [phone] Page: 2 of 2 Call Id: [id number] [place]. Time Eilene Ghazi Time) Disposition Final User 04/22/2019 12:45:55 PM Send To Nurse Ria Comment, RN, April 04/22/2019 1:30:04 PM SEE PCP WITHIN 3 DAYS Yes Hardin Negus, RN, [place] Disagree/Comply Comply Caller Understands Yes PreDisposition Did not know what to do Care Advice Given Per Guideline SEE PCP WITHIN 3 DAYS: SOFT DIET: * Cold drinks, popsicles, and milk shakes are especially good. Avoid citrus fruits. DRINK PLENTY LIQUIDS: CALL BACK IF: * You become worse. CARE ADVICE given per Sore Throat (Adult) guideline.

## 2019-04-24 NOTE — Telephone Encounter (Signed)
Returned call to Pt.    Per Pt on Thursday she developed a "fever".  Pt states her temp started running 98.0 with highest today at 99.0.  Per Pt this is running a temperature for her.  She states her lower abdomen was extended and then she a very large bowel movement that left her very weak.  Her lower abdomen extension was resolved after the bowel movement.  She states she "fell to the ground" after her bowel movement because it took so long and she felt so weak.  Advised Pt it sounds like she had some constipation after the procedure which is normal.   She feels very weak and her face is hot.  Advised Pt that she should call her PCP today.  Advised I would also discuss with Dr. Rayann Heman.    Pt states her groin area is fine, some bruising.

## 2019-04-24 NOTE — Telephone Encounter (Signed)
Pt called because she has had complications following her ablation last Tuesday. Please call to discuss.

## 2019-04-24 NOTE — Progress Notes (Signed)
[place] T. Vanecia Limpert, MD Primary Care and [place] at [facility] [place], [address] Phone: [phone]  FAX: [phone]  LUELLE KULESA - 73 y.o. female  MRN [id number]  Date of Birth: 1946-01-12  Visit Date: 04/24/2019  PCP: Jearld Fenton, NP  Referred by: Jearld Fenton, NP Chief Complaint  Patient presents with  . Fever    started Thursday-low grade-Discharged from Hospital on 04/18/2019  . Diarrhea   Virtual Visit via Video Note:  I connected with  Monica Whitaker on 04/24/2019 12:00 PM EDT by a video enabled telemedicine application and verified that I am speaking with the correct person using two identifiers.   Location patient: home computer, tablet, or smartphone Location provider: work or home office Consent: Verbal consent directly obtained from [employer]. Persons participating in the virtual visit: patient, provider  I discussed the limitations of evaluation and management by telemedicine and the availability of in person appointments. The patient expressed understanding and agreed to proceed.  History of Present Illness:  Fever, diarrhea, d/c from hospital 04/18/2019 for ablation by Dr. Rayann Heman  04/14/2019 coronavirus is negative  Initially when she called them, the staff felt like she was having some acute diarrhea.  When I talked to the patient, she actually had some episode of diarrhea this morning, but she is primarily calling with hypotension and presyncope.  Per the patient she has had this off and on for a long time.  She is on multiple cardiac medicines that she is a patient of Dr. Rayann Heman, and she had a cardiac ablation on the 15th.  Review of Systems as above: See pertinent positives and pertinent negatives per HPI No acute distress verbally  Past Medical History, Surgical History, Social History, Family History, Problem List, Medications, and Allergies have been reviewed and updated  if relevant.   Observations/Objective/Exam:  An attempt was made to discern vital signs over the phone and per patient if applicable and possible.   General:    Alert, Oriented, appears well and in no acute distress HEENT:     Atraumatic, conjunctiva clear, no obvious abnormalities on inspection of external nose and ears.  Neck:    Normal movements of the head and neck Pulmonary:     On inspection no signs of respiratory distress, breathing rate appears normal, no obvious gross SOB, gasping or wheezing Cardiovascular:    No obvious cyanosis Musculoskeletal:    Moves all visible extremities without noticeable abnormality Psych / Neurological:     Pleasant and cooperative, no obvious depression or anxiety, speech and thought processing grossly intact  Assessment and Plan:    ICD-10-CM   1. Idiopathic hypotension  I95.0   2. Orthostatic hypotension  I95.1   3. Persistent atrial fibrillation  I48.19   4. Cerebrovascular accident (CVA) due to embolism of left middle cerebral artery ([name])  I63.412    >25 minutes spent in face to face time with patient, >50% spent in counselling or coordination of care   Complicated medical patient who just had a cardiac ablation a few days ago by Dr. Rayann Heman.  She takes multiple cardiac medications including Lasix, flecainide, diltiazem.  She is also taking some Adderall.  This is a complex problem from a cardiac standpoint.  Her pulse is been as low as in the [id number] to the diastolic in the 123456 and 123456.  Truly I do not know what to do with this patient from the  standpoint.  I asked her to increase her salt tablet dose to 2 in the morning and 2 with lunch.  I have sent a message to her EP cardiologist, and requested his assistance and input with this case.  I discussed the assessment and treatment plan with the patient. The patient was provided an opportunity to ask questions and all were answered. The patient agreed with the plan and demonstrated an  understanding of the instructions.   The patient was advised to call back or seek an in-person evaluation if the symptoms worsen or if the condition fails to improve as anticipated.  Follow-up: prn unless noted otherwise below No follow-ups on file.  No orders of the defined types were placed in this encounter.  No orders of the defined types were placed in this encounter.   Signed,  Maud Deed. Carliss Porcaro, MD

## 2019-04-24 NOTE — Telephone Encounter (Signed)
Pt states headache gone after stopping protonix. Feeling some better this afternoon after a long nap. Pt continues with low grade fever (99) denies urinary symptoms, cough, shortness of breath or chest pain. Encouraged pt to continue to monitor temperatures if they persistent she should see PCP in office. Pt verbalized understanding.

## 2019-04-26 ENCOUNTER — Encounter: Payer: Self-pay | Admitting: Internal Medicine

## 2019-04-26 ENCOUNTER — Telehealth: Payer: Self-pay

## 2019-04-26 ENCOUNTER — Other Ambulatory Visit: Payer: Self-pay

## 2019-04-26 ENCOUNTER — Ambulatory Visit (INDEPENDENT_AMBULATORY_CARE_PROVIDER_SITE_OTHER): Payer: Medicare Other | Admitting: Internal Medicine

## 2019-04-26 VITALS — BP 99/75 | Temp 98.8°F

## 2019-04-26 DIAGNOSIS — R059 Cough, unspecified: Secondary | ICD-10-CM

## 2019-04-26 DIAGNOSIS — R05 Cough: Secondary | ICD-10-CM | POA: Diagnosis not present

## 2019-04-26 DIAGNOSIS — R509 Fever, unspecified: Secondary | ICD-10-CM

## 2019-04-26 DIAGNOSIS — Z20822 Contact with and (suspected) exposure to covid-19: Secondary | ICD-10-CM

## 2019-04-26 NOTE — Patient Instructions (Signed)

## 2019-04-26 NOTE — Progress Notes (Signed)
Virtual Visit via Video Note  I connected with Monica Whitaker on 04/26/19 at  2:00 PM EDT by a video enabled telemedicine application and verified that I am speaking with the correct person using two identifiers.  Location: Patient: Monica Whitaker Provider: Office   I discussed the limitations of evaluation and management by telemedicine and the availability of in person appointments. The patient expressed understanding and agreed to proceed.  History of Present Illness:  Pt reports cough and elevated temperature. She reports this started last night. She reports the fever has been as high as 98.9 F. She is concerned this is a fever, because she normally runs 96.9 F. The cough is nonproductive. She denies headache, runny nose, nasal congestion, ear pain, sore throat, shortness of breath, loss of taste or smell. She denies nausea, vomiting and diarrhea. She has not had sick contacts that she is aware of but was in the hospital last week for a heart ablation. She has taken Tylenol with minimal relief.  Past Medical History:  Diagnosis Date  . Anxiety    controlled with meds  . Anxiety   . Arrhythmia   . Atrial fibrillation ([name])    paroxysmal, failed medical therapy with flecainide,  NSVT with tikosyn  . Bruises easily   . CVA (cerebral vascular accident) ([place]) 12/2007  . Depression    medically controlled   . Low blood pressure    no falls, but if gets up to fast  . Stroke ([place]) 7 years ago   Lasting effects on balance, different personality.  . Thyroid disease   . 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 acetaminophen (TYLENOL) 500 MG tablet Take 1,000 mg by mouth every 6 (six) hours as needed (pain). Take 2 tablets by mouth every 6 hours as needed for pain    . amphetamine-dextroamphetamine (ADDERALL XR) 10 MG 24 hr capsule Take 1 capsule (10 mg total) by mouth daily. 90 capsule 0   . apixaban (ELIQUIS) 5 MG TABS tablet Take 1 tablet (5 mg total) by mouth 2 (two) times daily. 180 tablet 1  . calcium carbonate (OS-CAL) 600 MG TABS Take 600 mg by mouth daily with breakfast.     . cholecalciferol (VITAMIN D) 1000 UNITS tablet Take 2,000 Units by mouth daily.     . cyclobenzaprine (FLEXERIL) 10 MG tablet Take 0.5-1 tablets (5-10 mg total) by mouth at bedtime as needed for muscle spasms. 30 tablet 0  . denosumab (PROLIA) 60 MG/ML SOLN injection Inject 60 mg into the skin every 6 (six) months.     . diltiazem (CARDIZEM CD) 240 MG 24 hr capsule TAKE 1 CAPSULE (240 MG TOTAL) BY MOUTH DAILY. MAY TAKE EXTRA CAPSULE DAILY AS NEEDED FOR AFIB 95 capsule 1  . diltiazem (CARDIZEM) 30 MG tablet Ttake 1 tablet every 4 hours AS NEEDED for rapid afib heart rate over 100 45 tablet 1  . DULoxetine (CYMBALTA) 30 MG capsule TAKE 1 CAPSULE BY MOUTH EVERY DAY along with 60 mg capsule 90 capsule 1  . DULoxetine (CYMBALTA) 60 MG capsule Take 1 capsule (60 mg total) by mouth daily. Take with 30 mg duloxetine 90 capsule 1  . estradiol (ESTRACE VAGINAL) 0.1 MG/GM vaginal cream Apply 0.5mg  (pea-sized amount)  just inside the vaginal introitus with a finger-tip on Monday, Wednesday and Friday nights. 30 g 12  . flecainide (TAMBOCOR) 100 MG  tablet TAKE 1 TABLET BY MOUTH TWICE A DAY 180 tablet 1  . furosemide (LASIX) 20 MG tablet Take 0.5 tablets (10 mg total) by mouth as needed for edema. 10 tablet 3  . levothyroxine (SYNTHROID) 25 MCG tablet TAKE 1 TABLET BY MOUTH EVERY DAY 90 tablet 0  . Multiple Vitamin (MULTIVITAMIN) tablet Take 1 tablet by mouth daily.      . Oral Electrolytes (BUFFERED SALT PO) Take 1 capsule by mouth daily as needed (Electrolyte/Salt supplement).     . pantoprazole (PROTONIX) 40 MG tablet Take 1 tablet (40 mg total) by mouth daily. For 6 weeks 45 tablet 0  . pramipexole (MIRAPEX) 0.125 MG tablet TAKE 2 TABLETS BY MOUTH EVERY MORNING 180 tablet 0  . timolol (TIMOPTIC) 0.5 % ophthalmic  solution Place 1 drop into the left eye daily.     Marland Kitchen tiZANidine (ZANAFLEX) 2 MG tablet TAKE 1/2-1 TABLETS (1-2 MG TOTAL) BY MOUTH AT BEDTIME. (Patient taking differently: Take 1-2 mg by mouth at bedtime as needed for muscle spasms. ) 30 tablet 0  . vitamin B-12 (CYANOCOBALAMIN) 1000 MCG tablet Take 1,000 mcg by mouth daily.    . vitamin C (ASCORBIC ACID) 500 MG tablet Take 500 mg by mouth daily.    . vitamin E 400 UNIT capsule Take 400 Units by mouth daily.       No current facility-administered medications for this visit.     Allergies  Allergen Reactions  . Darvon Other (See Comments)    Severe panic attacks  . Propoxyphene Other (See Comments)    GI Upset Severe panic attacks GI Upset  . Propoxyphene N-Acetaminophen Other (See Comments)    Severe panic attacks  . Levofloxacin Anxiety and Other (See Comments)    Causes panic attacks.    Family History  Problem Relation Age of Onset  . Multiple sclerosis Father   . Breast cancer Neg Hx     Social History   Socioeconomic History  . Marital status: Widowed    Spouse name: Not on file  . Number of children: 0  . Years of education: Not on file  . Highest education level: Not on file  Occupational History  . Occupation: Retired    Fish farm manager: RETIRED  Social Needs  . Financial resource strain: Not on file  . Food insecurity    Worry: Not on file    Inability: Not on file  . Transportation needs    Medical: Not on file    Non-medical: Not on file  Tobacco Use  . Smoking status: Never Smoker  . Smokeless tobacco: Never Used  Substance and Sexual Activity  . Alcohol use: Yes    Alcohol/week: 1.0 standard drinks    Types: 1 Standard drinks or equivalent per week    Comment: regular  . Drug use: No  . Sexual activity: Never  Lifestyle  . Physical activity    Days per week: Not on file    Minutes per session: Not on file  . Stress: Not on file  Relationships  . Social Herbalist on phone: Not on file     Gets together: Not on file    Attends religious service: Not on file    Active member of club or organization: Not on file    Attends meetings of clubs or organizations: Not on file    Relationship status: Not on file  . Intimate partner violence    Fear of current or ex partner: Not on  file    Emotionally abused: Not on file    Physically abused: Not on file    Forced sexual activity: Not on file  Other Topics Concern  . Not on file  Social History Narrative   Lives in [place] with her husband.  No children.  Former Psychologist, prison and probation services   Enjoys exercise- water classes, yoga Editor, commissioning.       Has a living will-    Would desire CPR   Would not want prolonged life support if futile.     Constitutional: Pt reports fever. Denies fatigue or headache. HEENT: Denies eye pain, eye redness, ear pain, runny nose, nasal congestion, or sore throat. Respiratory: Pt reports cough. Denies difficulty breathing, shortness of breath, or sputum production.   Gastrointestinal: Denies nausea, vomiting, constipation or diarrhea   No other specific complaints in a complete review of systems (except as listed in HPI above).    Observations/Objective: BP 99/75   Temp 98.8 F (37.1 C) (Oral)  Wt Readings from Last 3 Encounters:  04/24/19 164 lb (74.4 kg)  04/19/19 169 lb 12.8 oz (77 kg)  04/03/19 172 lb 8 oz (78.2 kg)    General: Appears her stated age. Pulmonary/Chest: Appears to breath with normal effort. No obvious respiratory distress.   Psychiatric: Anxious appearing.    BMET    Component Value Date/Time   NA 139 04/14/2019 1042   NA 139 08/20/2017 1619   NA 136 11/07/2014 1539   K 4.7 04/14/2019 1042   K 4.3 11/07/2014 1539   CL 105 04/14/2019 1042   CL 100 (L) 11/07/2014 1539   CO2 28 04/14/2019 1042   CO2 28 11/07/2014 1539   GLUCOSE 92 04/14/2019 1042   GLUCOSE 107 (H) 11/07/2014 1539   BUN 19 04/14/2019 1042   BUN 17 08/20/2017 1619   BUN 22 (H) 11/07/2014 1539    CREATININE 0.59 04/14/2019 1042   CREATININE 0.60 11/07/2014 1539   CALCIUM 8.7 (L) 04/14/2019 1042   CALCIUM 9.2 11/07/2014 1539   GFRNONAA >60 04/14/2019 1042   GFRNONAA >60 11/07/2014 1539   GFRAA >60 04/14/2019 1042   GFRAA >60 11/07/2014 1539    Lipid Panel     Component Value Date/Time   CHOL 219 (H) 05/10/2018 0934   TRIG 57.0 05/10/2018 0934   HDL 97.50 05/10/2018 0934   CHOLHDL 2 05/10/2018 0934   VLDL 11.4 05/10/2018 0934   LDLCALC 110 (H) 05/10/2018 0934   LDLCALC 99 05/07/2017 1619    CBC    Component Value Date/Time   WBC 4.9 04/14/2019 1042   RBC 4.41 04/14/2019 1042   HGB 13.1 04/14/2019 1042   HGB 13.0 08/20/2017 1619   HCT 40.4 04/14/2019 1042   HCT 38.1 08/20/2017 1619   PLT 304 04/14/2019 1042   PLT 286 08/20/2017 1619   MCV 91.6 04/14/2019 1042   MCV 89 08/20/2017 1619   MCV 92 11/07/2014 1539   MCH 29.7 04/14/2019 1042   MCHC 32.4 04/14/2019 1042   RDW 14.5 04/14/2019 1042   RDW 14.1 08/20/2017 1619   RDW 13.9 11/07/2014 1539   LYMPHSABS 1.8 04/14/2019 1042   MONOABS 0.8 04/14/2019 1042   EOSABS 0.4 04/14/2019 1042   BASOSABS 0.0 04/14/2019 1042    Hgb A1C No results found for: HGBA1C   Assessment and Plan:  Fever, Cough:  She has already gone for COVID 19 testing- results pending Encouraged rest, fluids Continue Tylenol prn Discussed the importance of self quarantining until results  are received Discussed the importance of masking, hand washing and social distancing ER precautions discussed  Follow Up Instructions:    I discussed the assessment and treatment plan with the patient. The patient was provided an opportunity to ask questions and all were answered. The patient agreed with the plan and demonstrated an understanding of the instructions.   The patient was advised to call back or seek an in-person evaluation if the symptoms worsen or if the condition fails to improve as anticipated.     Webb Silversmith, NP ]

## 2019-04-26 NOTE — Telephone Encounter (Signed)
Will discuss at upcoming appt.

## 2019-04-26 NOTE — Telephone Encounter (Signed)
Pt calling concerned if she has covid symptoms or something else like a cold. Pt had ablation last wk and has felt weak and tired. Pt's normal temp is 97 and then goes up during the day to 98.8 which pt considers a fever. Pt started with dry cough last night and had a S/T last night but no S/T this morning. Pt has had a H/A on and off and last wk had diarrhea 2 days. No travel and no known exposure to covid. On 04/14/19 pt had neg covid test prior to ablation. ED or UC precautions given.Pt has virtual appt today with R Baity NP at [facility]. Pt will have vitals done prior to appt. Pt is very anxious. FYI to Avie Echevaria NP.

## 2019-04-28 ENCOUNTER — Telehealth: Payer: Self-pay | Admitting: Internal Medicine

## 2019-04-28 ENCOUNTER — Telehealth (HOSPITAL_COMMUNITY): Payer: Self-pay | Admitting: *Deleted

## 2019-04-28 ENCOUNTER — Other Ambulatory Visit: Payer: Self-pay | Admitting: Family Medicine

## 2019-04-28 LAB — NOVEL CORONAVIRUS, NAA: SARS-CoV-2, NAA: NOT DETECTED

## 2019-04-28 NOTE — Telephone Encounter (Signed)
Pt called in today stating she stills feel "puny" no fevers above 98 but pt is taking tylenol. No symptoms other than just not feeling well. Still pending COVID result. Instructed pt to have urine checked and call me back with results. If negative will see Dr. Rayann Heman on Monday.

## 2019-04-28 NOTE — Telephone Encounter (Signed)
I spoke to pt and she states she went to UC today and was Dx with bladder infection, sinusitis etc

## 2019-04-28 NOTE — Telephone Encounter (Signed)
Patient stated that she had surgery over a week ago and she is still running a fever. Patient stated that her Cardiologist, Dr Nikki Dom in [place] suggested she have a urine test done and she contacted [place] to have this done   What do you suggest?

## 2019-04-28 NOTE — Telephone Encounter (Signed)
Last office visit 04/26/2019 for fever.  Last refilled 04/04/2019 for #30 with no refills by Dr. Lorelei Pont.  No future appointments with PCP.  Ok to refill?

## 2019-04-28 NOTE — Telephone Encounter (Signed)
I just saw her yesterday. She did not c/o of any urinary symptoms and she doesn't have a true fever. 98.9 is not a concerning fever even if she normal runs 96. I have no appts left for today. Can the [place] nurse at [facility] runs a urinalysis?

## 2019-04-28 NOTE — Telephone Encounter (Signed)
Pt decided to go to walk-in clinic in [place] - diagnosed with sinus infection - given antibiotics. Pt canceled appt made with allred for Monday felt this was the cause of her not feeling well. Appt canceled. Pt instructed to let us know if new symptoms arise.

## 2019-05-01 ENCOUNTER — Telehealth: Payer: Self-pay

## 2019-05-01 ENCOUNTER — Other Ambulatory Visit: Payer: Self-pay | Admitting: Internal Medicine

## 2019-05-01 ENCOUNTER — Ambulatory Visit: Payer: Medicare Other | Admitting: Internal Medicine

## 2019-05-01 NOTE — Telephone Encounter (Signed)
Copied from [place] [id number]. Topic: Quick Communication - See Telephone Encounter >> May 01, 2019  8:54 AM Loma [place] wrote: CRM for notification. See Telephone encounter for: 05/01/19. Pt is not happy with things since COVID and feels she needs to be getting information from a MD. Wants to switch to a MD at [facility]. Please Advise pt at [phone]. She is not specific on any Dr just an MD at [facility], states that she could not be seen with a fever or even come in. Told that was the normal for Covid but says she just wants MD. Pls reach out

## 2019-05-01 NOTE — Telephone Encounter (Signed)
Copied from [place] [id number]. Topic: Quick Communication - See Telephone Encounter >> May 01, 2019  8:54 AM Loma Boston wrote: CRM for notification. See Telephone encounter for: 05/01/19. Pt is not happy with things since COVID and feels she needs to be getting information from a MD. Wants to switch to a MD at [facility]. Please Advise pt at [phone]. She is not specific on any Dr just an MD at [facility], states that she could not be seen with a fever or even come in. Told that was the normal for Covid but says she just wants MD. Pls reach out

## 2019-05-01 NOTE — Telephone Encounter (Signed)
[place] with me. Very sweet but very anxious lady.

## 2019-05-03 ENCOUNTER — Ambulatory Visit (HOSPITAL_COMMUNITY)
Admission: RE | Admit: 2019-05-03 | Discharge: 2019-05-03 | Disposition: A | Discharge status: Home / Self Care | Payer: Medicare Other | Source: Ambulatory Visit | Attending: Nurse Practitioner | Admitting: Nurse Practitioner

## 2019-05-03 ENCOUNTER — Other Ambulatory Visit: Payer: Self-pay

## 2019-05-03 ENCOUNTER — Telehealth: Payer: Self-pay | Admitting: Internal Medicine

## 2019-05-03 ENCOUNTER — Encounter (HOSPITAL_COMMUNITY): Payer: Self-pay | Admitting: Nurse Practitioner

## 2019-05-03 VITALS — BP 112/66 | HR 71 | Ht 69.5 in | Wt 173.3 lb

## 2019-05-03 DIAGNOSIS — Z886 Allergy status to analgesic agent status: Secondary | ICD-10-CM | POA: Insufficient documentation

## 2019-05-03 DIAGNOSIS — Z885 Allergy status to narcotic agent status: Secondary | ICD-10-CM | POA: Insufficient documentation

## 2019-05-03 DIAGNOSIS — Z881 Allergy status to other antibiotic agents status: Secondary | ICD-10-CM | POA: Insufficient documentation

## 2019-05-03 DIAGNOSIS — R9431 Abnormal electrocardiogram [ECG] [EKG]: Secondary | ICD-10-CM | POA: Diagnosis not present

## 2019-05-03 DIAGNOSIS — Z8673 Personal history of transient ischemic attack (TIA), and cerebral infarction without residual deficits: Secondary | ICD-10-CM | POA: Insufficient documentation

## 2019-05-03 DIAGNOSIS — F329 Major depressive disorder, single episode, unspecified: Secondary | ICD-10-CM | POA: Diagnosis not present

## 2019-05-03 DIAGNOSIS — I480 Paroxysmal atrial fibrillation: Secondary | ICD-10-CM

## 2019-05-03 DIAGNOSIS — Z79899 Other long term (current) drug therapy: Secondary | ICD-10-CM | POA: Diagnosis not present

## 2019-05-03 DIAGNOSIS — Z7901 Long term (current) use of anticoagulants: Secondary | ICD-10-CM | POA: Diagnosis not present

## 2019-05-03 DIAGNOSIS — I472 Ventricular tachycardia: Secondary | ICD-10-CM | POA: Diagnosis not present

## 2019-05-03 DIAGNOSIS — Z79890 Hormone replacement therapy (postmenopausal): Secondary | ICD-10-CM | POA: Insufficient documentation

## 2019-05-03 DIAGNOSIS — E079 Disorder of thyroid, unspecified: Secondary | ICD-10-CM | POA: Insufficient documentation

## 2019-05-03 DIAGNOSIS — F410 Panic disorder [episodic paroxysmal anxiety] without agoraphobia: Secondary | ICD-10-CM | POA: Insufficient documentation

## 2019-05-03 NOTE — Telephone Encounter (Signed)
appt made to assess pt.

## 2019-05-03 NOTE — Progress Notes (Signed)
Primary Care Physician: Jearld Fenton, NP Referring Physician: Dr. Felizardo Hoffmann Monica Whitaker is a 73 y.o. female with a h/o afib ablation that is in the clinic for f/u ablation 04/18/19. She just felt blah and was noting " low grade fevers" for her that were under 99 degrees.She went to Urgent Care at [facility] and was found to have UTI and possible sinus infection and was started on Omnicef. Culture showed E. Coli with 100,000 colonies and it is sensitive to [facility]. She is staying in [place]. Temp today is 97.4 degrees.   Today, she denies symptoms of palpitations, chest pain, shortness of breath, orthopnea, PND, lower extremity edema, dizziness, presyncope, syncope, or neurologic sequela. The patient is tolerating medications without difficulties and is otherwise without complaint today.   Past Medical History:  Diagnosis Date  . Anxiety    controlled with meds  . Anxiety   . Arrhythmia   . Atrial fibrillation ([name])    paroxysmal, failed medical therapy with flecainide,  NSVT with tikosyn  . Bruises easily   . CVA (cerebral vascular accident) ([place]) 12/2007  . Depression    medically controlled   . Low blood pressure    no falls, but if gets up to fast  . Stroke ([place]) 7 years ago   Lasting effects on balance, different personality.  . Thyroid disease   . 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 ATRIAL FIBRILLATION ABLATION  10/18/12   PVI by Dr Rayann Heman  . ATRIAL FIBRILLATION ABLATION N/A 10/18/2012   Procedure: ATRIAL FIBRILLATION ABLATION;  Surgeon: Thompson Grayer, MD;  Location: [facility] CATH LAB;  Service: Cardiovascular;  Laterality: N/A;  . ATRIAL FIBRILLATION ABLATION N/A 04/18/2019   Procedure: ATRIAL FIBRILLATION ABLATION;  Surgeon: Thompson Grayer, MD;  Location: [facility] CV LAB;  Service: Cardiovascular;  Laterality: N/A;  . EYE SURGERY     on L/R eye, macular hole   . OPEN REDUCTION  INTERNAL FIXATION (ORIF) DISTAL RADIAL FRACTURE Right 11/15/2014   Procedure: OPEN REDUCTION INTERNAL FIXATION (ORIF) RIGHT DISTAL RADIAL FRACTURE WITH ALLOGRAFT BONE GRAFT;  Surgeon: Roseanne Kaufman, MD;  Location: [place];  Service: Orthopedics;  Laterality: Right;  . TEE WITHOUT CARDIOVERSION N/A 10/18/2012   Procedure: TRANSESOPHAGEAL ECHOCARDIOGRAM (TEE);  Surgeon: Lelon Perla, MD;  Location: [facility] ENDOSCOPY;  Service: Cardiovascular;  Laterality: N/A;  Pre-Ablation at 12pm  . TONSILLECTOMY    . VAGINAL HYSTERECTOMY      Current Outpatient Medications  Medication Sig Dispense Refill  . acetaminophen (TYLENOL) 500 MG tablet Take 1,000 mg by mouth every 6 (six) hours as needed (pain). Take 2 tablets by mouth every 6 hours as needed for pain    . amphetamine-dextroamphetamine (ADDERALL XR) 10 MG 24 hr capsule Take 1 capsule (10 mg total) by mouth daily. 90 capsule 0  . apixaban (ELIQUIS) 5 MG TABS tablet Take 1 tablet (5 mg total) by mouth 2 (two) times daily. 180 tablet 1  . benzonatate (TESSALON) 200 MG capsule Take by mouth.    . calcium carbonate (OS-CAL) 600 MG TABS Take 600 mg by mouth daily with breakfast.     . cefdinir (OMNICEF) 300 MG capsule Take by mouth.    . cholecalciferol (VITAMIN D) 1000 UNITS tablet Take 2,000 Units by mouth daily.     Marland Kitchen CRANBERRY EXTRACT PO Take by mouth.    Marland Kitchen  cyclobenzaprine (FLEXERIL) 10 MG tablet Take 0.5-1 tablets (5-10 mg total) by mouth at bedtime as needed for muscle spasms. 30 tablet 0  . denosumab (PROLIA) 60 MG/ML SOLN injection Inject 60 mg into the skin every 6 (six) months.     . diltiazem (CARDIZEM CD) 240 MG 24 hr capsule TAKE 1 CAPSULE (240 MG TOTAL) BY MOUTH DAILY. MAY TAKE EXTRA CAPSULE DAILY AS NEEDED FOR AFIB 95 capsule 1  . diltiazem (CARDIZEM) 30 MG tablet Ttake 1 tablet every 4 hours AS NEEDED for rapid afib heart rate over 100 45 tablet 1  . DULoxetine (CYMBALTA) 30 MG capsule TAKE 1 CAPSULE BY MOUTH EVERY DAY along  with 60 mg capsule 90 capsule 1  . DULoxetine (CYMBALTA) 60 MG capsule Take 1 capsule (60 mg total) by mouth daily. Take with 30 mg duloxetine 90 capsule 1  . estradiol (ESTRACE VAGINAL) 0.1 MG/GM vaginal cream Apply 0.5mg  (pea-sized amount)  just inside the vaginal introitus with a finger-tip on Monday, Wednesday and Friday nights. 30 g 12  . flecainide (TAMBOCOR) 100 MG tablet TAKE 1 TABLET BY MOUTH TWICE A DAY 180 tablet 1  . furosemide (LASIX) 20 MG tablet Take 0.5 tablets (10 mg total) by mouth as needed for edema. 10 tablet 3  . levothyroxine (SYNTHROID) 25 MCG tablet TAKE 1 TABLET BY MOUTH EVERY DAY 90 tablet 0  . Multiple Vitamin (MULTIVITAMIN) tablet Take 1 tablet by mouth daily.      . Oral Electrolytes (BUFFERED SALT PO) Take 1 capsule by mouth daily as needed (Electrolyte/Salt supplement).     . pantoprazole (PROTONIX) 40 MG tablet Take 1 tablet (40 mg total) by mouth daily. For 6 weeks 45 tablet 0  . pramipexole (MIRAPEX) 0.125 MG tablet TAKE 2 TABLETS BY MOUTH EVERY MORNING 180 tablet 0  . SALT SUBSTITUTES PO Take 1 tablet by mouth.    . timolol (TIMOPTIC) 0.5 % ophthalmic solution Place 1 drop into the left eye daily.     Marland Kitchen tiZANidine (ZANAFLEX) 2 MG tablet TAKE 1/2-1 TABLETS (1-2 MG TOTAL) BY MOUTH AT BEDTIME. (Patient taking differently: Take 1-2 mg by mouth at bedtime as needed for muscle spasms. ) 30 tablet 0  . vitamin B-12 (CYANOCOBALAMIN) 1000 MCG tablet Take 1,000 mcg by mouth daily.    . vitamin C (ASCORBIC ACID) 500 MG tablet Take 500 mg by mouth daily.    . vitamin E 400 UNIT capsule Take 400 Units by mouth daily.       No current facility-administered medications for this encounter.     Allergies  Allergen Reactions  . Darvon Other (See Comments)    Severe panic attacks  . Propoxyphene Other (See Comments)    GI Upset Severe panic attacks GI Upset  . Propoxyphene N-Acetaminophen Other (See Comments)    Severe panic attacks  . Levofloxacin Anxiety and Other  (See Comments)    Causes panic attacks.    Social History   Socioeconomic History  . Marital status: Widowed    Spouse name: Not on file  . Number of children: 0  . Years of education: Not on file  . Highest education level: Not on file  Occupational History  . Occupation: Retired    Fish farm manager: RETIRED  Social Needs  . Financial resource strain: Not on file  . Food insecurity    Worry: Not on file    Inability: Not on file  . Transportation needs    Medical: Not on file    Non-medical:  Not on file  Tobacco Use  . Smoking status: Never Smoker  . Smokeless tobacco: Never Used  Substance and Sexual Activity  . Alcohol use: Yes    Alcohol/week: 1.0 standard drinks    Types: 1 Standard drinks or equivalent per week    Comment: regular  . Drug use: No  . Sexual activity: Never  Lifestyle  . Physical activity    Days per week: Not on file    Minutes per session: Not on file  . Stress: Not on file  Relationships  . Social Herbalist on phone: Not on file    Gets together: Not on file    Attends religious service: Not on file    Active member of club or organization: Not on file    Attends meetings of clubs or organizations: Not on file    Relationship status: Not on file  . Intimate partner violence    Fear of current or ex partner: Not on file    Emotionally abused: Not on file    Physically abused: Not on file    Forced sexual activity: Not on file  Other Topics Concern  . Not on file  Social History Narrative   Lives in [place] with her husband.  No children.  Former Psychologist, prison and probation services   Enjoys exercise- water classes, yoga Editor, commissioning.       Has a living will-    Would desire CPR   Would not want prolonged life support if futile.    Family History  Problem Relation Age of Onset  . Multiple sclerosis Father   . Breast cancer Neg Hx     ROS- All systems are reviewed and negative except as per the HPI above  Physical Exam: Vitals:    05/03/19 1414  BP: 112/66  Pulse: 71  Weight: 78.6 kg  Height: 5' 9.5" (1.765 m)   Wt Readings from Last 3 Encounters:  05/03/19 78.6 kg  04/24/19 74.4 kg  04/19/19 77 kg    Labs: Lab Results  Component Value Date   NA 139 04/14/2019   K 4.7 04/14/2019   CL 105 04/14/2019   CO2 28 04/14/2019   GLUCOSE 92 04/14/2019   BUN 19 04/14/2019   CREATININE 0.59 04/14/2019   CALCIUM 8.7 (L) 04/14/2019   MG 2.1 04/16/2011   Lab Results  Component Value Date   INR 1.4 11/07/2014   Lab Results  Component Value Date   CHOL 219 (H) 05/10/2018   HDL 97.50 05/10/2018   LDLCALC 110 (H) 05/10/2018   TRIG 57.0 05/10/2018     GEN- The patient is well appearing, alert and oriented x 3 today.   Head- normocephalic, atraumatic Eyes-  Sclera clear, conjunctiva pink Ears- hearing intact Oropharynx- clear Neck- supple, no JVP Lymph- no cervical lymphadenopathy Lungs- Clear to ausculation bilaterally, normal work of breathing Heart- Regular rate and rhythm, no murmurs, rubs or gallops, PMI not laterally displaced GI- soft, NT, ND, + BS Extremities- no clubbing, cyanosis, or edema MS- no significant deformity or atrophy Skin- no rash or lesion Psych- euthymic mood, full affect Neuro- strength and sensation are intact  EKG- NSR at 71 bpm, pr int 172 ms, qrs int 92 ms, qtc 399 ms Epic records reviewed Care everywhere records reviewed at [facility] clinic urgent care    Assessment and Plan: 1. Paroxsymal afib  S/p ablation and has been in SR since procedure No swallowing or groin issues Had a headache after the  procedure and PPI was stopped with H/A resolved  Has continued to feel some malaise since procedure and was found to have a UTI, currently  on 7 day course of Omnicef Otherwise, reassured that I do not see any procedure related issues and am encouraged that she has not had any afib She should start feeling better now that UTI is being treated   F/u here at one month post  procedure 10/13  Butch Penny C. Patty Leitzke, [facility] [address] [place], [place] [address] [phone]

## 2019-05-03 NOTE — Telephone Encounter (Signed)
Patient will contact the office to transfer care. She is following up with her cardiologist due to a low grade fever she is running after an oblation. The patient explained what occurred in the office that warranted her transferring. Very nice lady.

## 2019-05-03 NOTE — Telephone Encounter (Signed)
New Message:      Pt says she thinks she needs to be seen. She still have the low grade fever, she have had it for about 2 weeks since her Ablation.

## 2019-05-04 ENCOUNTER — Other Ambulatory Visit: Payer: Self-pay | Admitting: Psychiatry

## 2019-05-05 NOTE — Telephone Encounter (Signed)
lmov to schedule echo  °

## 2019-05-09 ENCOUNTER — Other Ambulatory Visit: Payer: Self-pay

## 2019-05-10 ENCOUNTER — Ambulatory Visit (INDEPENDENT_AMBULATORY_CARE_PROVIDER_SITE_OTHER): Payer: Medicare Other | Admitting: Internal Medicine

## 2019-05-10 ENCOUNTER — Encounter: Payer: Self-pay | Admitting: Internal Medicine

## 2019-05-10 ENCOUNTER — Telehealth: Payer: Self-pay | Admitting: Internal Medicine

## 2019-05-10 ENCOUNTER — Other Ambulatory Visit: Payer: Self-pay

## 2019-05-10 VITALS — BP 98/70 | HR 86 | Temp 97.6°F | Ht 69.5 in | Wt 170.4 lb

## 2019-05-10 DIAGNOSIS — G8929 Other chronic pain: Secondary | ICD-10-CM

## 2019-05-10 DIAGNOSIS — F419 Anxiety disorder, unspecified: Secondary | ICD-10-CM

## 2019-05-10 DIAGNOSIS — R251 Tremor, unspecified: Secondary | ICD-10-CM | POA: Insufficient documentation

## 2019-05-10 DIAGNOSIS — Z8673 Personal history of transient ischemic attack (TIA), and cerebral infarction without residual deficits: Secondary | ICD-10-CM | POA: Insufficient documentation

## 2019-05-10 DIAGNOSIS — Z1231 Encounter for screening mammogram for malignant neoplasm of breast: Secondary | ICD-10-CM

## 2019-05-10 DIAGNOSIS — R102 Pelvic and perineal pain: Secondary | ICD-10-CM

## 2019-05-10 DIAGNOSIS — N390 Urinary tract infection, site not specified: Secondary | ICD-10-CM | POA: Diagnosis not present

## 2019-05-10 DIAGNOSIS — Z13220 Encounter for screening for lipoid disorders: Secondary | ICD-10-CM

## 2019-05-10 DIAGNOSIS — Z23 Encounter for immunization: Secondary | ICD-10-CM | POA: Diagnosis not present

## 2019-05-10 DIAGNOSIS — I872 Venous insufficiency (chronic) (peripheral): Secondary | ICD-10-CM

## 2019-05-10 DIAGNOSIS — I4891 Unspecified atrial fibrillation: Secondary | ICD-10-CM

## 2019-05-10 DIAGNOSIS — B9620 Unspecified Escherichia coli [E. coli] as the cause of diseases classified elsewhere: Secondary | ICD-10-CM

## 2019-05-10 DIAGNOSIS — M810 Age-related osteoporosis without current pathological fracture: Secondary | ICD-10-CM

## 2019-05-10 DIAGNOSIS — E039 Hypothyroidism, unspecified: Secondary | ICD-10-CM

## 2019-05-10 DIAGNOSIS — G2581 Restless legs syndrome: Secondary | ICD-10-CM | POA: Insufficient documentation

## 2019-05-10 NOTE — Telephone Encounter (Signed)
FYI: Dr. Olivia Mackie has pt coming in for a prolia injection on November 23rd. Just wanted to make sure one is on hand.

## 2019-05-10 NOTE — Telephone Encounter (Signed)
Even though this patient was previously receiving Prolia because you are the ordering provider now I still have to get the Prolia approved under your NPI, and I have to be aware due to we do not keep this on hand due to cost of Prolia I order as I get approval.So, please when  asking front desk to schedule let me know thanks. I have applied for approval and insurance verification.

## 2019-05-10 NOTE — Patient Instructions (Addendum)
cetaphil or cerave cream    Discuss with Cottle  increasing mirapex or change to another options below   Cranberry with D mannose to prevent UTI   Do not take flexeril and zanaflex together these are the same medications   Rotigotine transdermal skin patch (Neupro) What is this medicine? ROTIGOTINE (roe TIG [place] teen) is used to control the signs and symptoms of Parkinson's disease or restless legs syndrome. This medicine may be used for other purposes; ask your health care provider or pharmacist if you have questions. COMMON BRAND NAME(S): Neupro What should I tell my health care provider before I take this medicine? They need to know if you have any of these conditions:  heart disease  high blood pressure  lung or breathing disease, like asthma  mental illness  skin cancer  sleep disorder  an unusual or allergic reaction to rotigotine, sulfites, other medicines, foods, dyes, or preservatives  pregnant or trying to get pregnant  breast-feeding How should I use this medicine? This medicine is for external use only. Follow the directions on the prescription label. Use exactly as directed. Wash hands after removing and applying this medicine. Change the patch each day at the same time. Apply the patch to an area of the upper arm or body that is clean, dry, and hairless. Do not use this patch on skin that is injured, irritated, oily, or calloused. Do not apply where the patch will be rubbed by tight clothing or a waistband. Do not apply to the same place more than once every 14 days in order to prevent skin irritation. Do not cut or trim the patch. Take your medicine at regular intervals. Do not take it more often than directed. Do not stop taking except on your doctor's advice. Always remove the old patch before you apply a new one. Remove patch slowly and carefully to avoid irritation. After removal, fold the patch so that it sticks to itself and throw it away. After removal of  patch, wash the area with soap and water to remove any drug or adhesive. Baby oil or mineral oil may be used if needed. Do not use alcohol or other liquids. Talk to your pediatrician regarding the use of this medicine in children. Special care may be needed. Overdosage: If you think you have taken too much of this medicine contact a poison control center or emergency room at once. NOTE: This medicine is only for you. Do not share this medicine with others. What if I miss a dose? If you miss a dose, take it as soon as you can. If it is almost time for your next dose, take only that dose. Do not take double or extra doses. What may interact with this medicine?  alcohol  antihistamines for allergy, cough and cold  certain medicines for sleep  medicines for depression, anxiety, or psychotic disturbances  metoclopramide  narcotic medicines for pain This list may not describe all possible interactions. Give your health care provider a list of all the medicines, herbs, non-prescription drugs, or dietary supplements you use. Also tell them if you smoke, drink alcohol, or use illegal drugs. Some items may interact with your medicine. What should I watch for while using this medicine? Visit your doctor for regular check ups. Tell your doctor or healthcare professional if your symptoms do not start to get better or if they get worse. You may get drowsy or dizzy. Do not drive, use machinery, or do anything that needs mental alertness until  you know how this medicine affects you. Do not stand or sit up quickly, especially if you are an older patient. This reduces the risk of dizzy or fainting spells. Alcohol may interfere with the effect of this medicine. Avoid alcoholic drinks. If you find that you have sudden feelings of wanting to sleep during normal activities, like cooking, watching television, or while driving or riding in a car, you should contact your health care professional. There have been  reports of increased sexual urges or other strong urges such as gambling while taking this medicine. If you experience any of these while taking this medicine, you should report this to your health care provider as soon as possible. This medicine patch is sensitive to certain body heat changes. If your skin gets too hot, more medicine will come out of the patch. Call your healthcare provider if you get a fever. Do not take hot baths. Do not sunbathe. Do not use hot tubs, saunas, hair dryers, heating pads, electric blankets, heated waterbeds, or tanning lamps. Do not do exercise that increases your body temperature. If you are going to have a magnetic resonance imaging (MRI) procedure, tell your MRI technician if you have this patch on your body. It must be removed before a MRI. What side effects may I notice from receiving this medicine? Side effects that you should report to your doctor or health care professional as soon as possible:  allergic reactions like skin rash, itching or hives, swelling of the face, lips, or tongue  anxiety, restlessness  breathing problems  confusion  dizziness  falling asleep during normal activities like driving  fast, irregular or slow heartbeat  feeling faint or lightheaded, falls  hallucination, loss of contact with reality  skin irritation, redness, or swelling  uncontrollable head, mouth, neck, arm, or leg movements  uncontrollable and excessive urges (examples: gambling, binge eating, shopping, having sex) Side effects that usually do not require medical attention (report to your doctor or health care professional if they continue or are bothersome):  constipation  difficulty sleeping  headache  loss of appetite  nausea, vomiting  stomach pain  weight gain This list may not describe all possible side effects. Call your doctor for medical advice about side effects. You may report side effects to FDA at 1-800-FDA-1088. Where should I  keep my medicine? Keep out of the reach of children. Store at room temperature between 15 and 30 degrees C (59 and 86 degrees F). Keep container tightly closed. Store in original pouch until just before use. Throw away any unused medicine after the expiration date. NOTE: This sheet is a summary. It may not cover all possible information. If you have questions about this medicine, talk to your doctor, pharmacist, or health care provider.  2020 Elsevier/Gold Standard (2016-02-28 15:25:23)  Ropinirole tablets for restless legs What is this medicine? ROPINIROLE (roe PIN i role) is used to treat the symptoms of Parkinson's disease. It helps to improve muscle control and movement difficulties. It is also used for the treatment of Restless Legs Syndrome. This medicine may be used for other purposes; ask your health care provider or pharmacist if you have questions. COMMON BRAND NAME(S): Requip What should I tell my health care provider before I take this medicine? They need to know if you have any of these conditions:  dizzy or fainting spells  heart disease  high blood pressure  kidney disease  liver disease  low blood pressure  sleeping problems  an unusual or allergic  reaction to ropinirole, other medicines, foods, dyes, or preservatives  pregnant or trying to get pregnant  breast-feeding How should I use this medicine? Take this medicine by mouth with a glass of water. Follow the directions on the prescription label. You can take it with or without food. If it upsets your stomach, take it with food. Take your doses at regular intervals. Do not take your medicine more often than directed. Do not stop taking this medicine except on your doctor's advice. Stopping this medicine too quickly may cause serious side effects. Talk to your pediatrician regarding the use of this medicine in children. Special care may be needed. Overdosage: If you think you have taken too much of this medicine  contact a poison control center or emergency room at once. NOTE: This medicine is only for you. Do not share this medicine with others. What if I miss a dose? If you miss a dose, take it as soon as you can. If it is almost time for your next dose, take only that dose. Do not take double or extra doses. What may interact with this medicine?  ciprofloxacin  female hormones, like estrogens and birth control pills  medicines for depression, anxiety, or psychotic disturbances  metoclopramide  mexiletine  norfloxacin  omeprazole This list may not describe all possible interactions. Give your health care provider a list of all the medicines, herbs, non-prescription drugs, or dietary supplements you use. Also tell them if you smoke, drink alcohol, or use illegal drugs. Some items may interact with your medicine. What should I watch for while using this medicine? Visit your doctor or health care professional for regular checks on your progress. It may be several weeks or months before you feel the full effect of this medicine. You may get drowsy or dizzy. Do not drive, use machinery, or do anything that needs mental alertness until you know how this drug affects you. Do not stand or sit up quickly, especially if you are an older patient. This reduces the risk of dizzy or fainting spells. Alcohol can increase possible dizziness. Avoid alcoholic drinks. If you find that you have sudden feelings of wanting to sleep during normal activities, like cooking, watching television, or while driving or riding in a car, you should contact your health care professional. Your mouth may get dry. Chewing sugarless gum or sucking hard candy, and drinking plenty of water may help. Contact your doctor if the problem does not go away or is severe. There have been reports of increased sexual urges or other strong urges such as gambling while taking some medicines for Parkinson's disease. If you experience any of these  urges while taking this medicine, you should report it to your health care provider as soon as possible. You should check your skin often for changes to moles and new growths while taking this medicine. Call your doctor if you notice any of these changes. What side effects may I notice from receiving this medicine? Side effects that you should report to your doctor or health care professional as soon as possible:  allergic reactions like skin rash, itching or hives, swelling of the face, lips, or tongue  changes in vision  chest pain  confusion  falling asleep during normal activities like driving  fast, irregular heartbeat  feeling faint or lightheaded, falls  hallucination, loss of contact with reality  joint or muscle pain  loss of bladder control  loss of memory  new or increased gambling urges, sexual urges, uncontrolled  spending, binge or compulsive eating, or other urges  pain, tingling, numbness in the hands or feet  shortness of breath, troubled breathing, tightness in chest, or wheezing  signs and symptoms of low blood pressure like dizziness; feeling faint or lightheaded, falls; unusually weak or tired  swelling of the ankles, feet, hands  uncontrollable head, mouth, neck, arm, or leg movements  vomiting Side effects that usually do not require medical attention (report to your doctor or health care professional if they continue or are bothersome):  dizziness  drowsiness  headache  increased sweating  nausea  tremors This list may not describe all possible side effects. Call your doctor for medical advice about side effects. You may report side effects to FDA at 1-800-FDA-1088. Where should I keep my medicine? Keep out of the reach of children. Store at room temperature between 20 and 25 degrees C (68 and 77 degrees F). Protect from light and moisture. Keep container tightly closed. Throw away any unused medicine after the expiration date. NOTE:  This sheet is a summary. It may not cover all possible information. If you have questions about this medicine, talk to your doctor, pharmacist, or health care provider.  2020 Elsevier/Gold Standard (2016-01-06 10:58:05)   Restless Legs Syndrome Restless legs syndrome is a condition that causes uncomfortable feelings or sensations in the legs, especially while sitting or lying down. The sensations usually cause an overwhelming urge to move the legs. The arms can also sometimes be affected. The condition can range from mild to severe. The symptoms often interfere with a person's ability to sleep. What are the causes? The cause of this condition is not known. What increases the risk? The following factors may make you more likely to develop this condition:  Being older than 50.  Pregnancy.  Being a woman. In general, the condition is more common in women than in men.  A family history of the condition.  Having iron deficiency.  Overuse of caffeine, nicotine, or alcohol.  Certain medical conditions, such as kidney disease, Parkinson's disease, or nerve damage.  Certain medicines, such as those for high blood pressure, nausea, colds, allergies, depression, and some heart conditions. What are the signs or symptoms? The main symptom of this condition is uncomfortable sensations in the legs, such as:  Pulling.  Tingling.  Prickling.  Throbbing.  Crawling.  Burning. Usually, the sensations:  Affect both sides of the body.  Are worse when you sit or lie down.  Are worse at night. These may wake you up or make it difficult to fall asleep.  Make you have a strong urge to move your legs.  Are temporarily relieved by moving your legs. The arms can also be affected, but this is rare. People who have this condition often have tiredness during the day because of their lack of sleep at night. How is this diagnosed? This condition may be diagnosed based on:  Your  symptoms.  Blood tests. In some cases, you may be monitored in a sleep lab by a specialist (a sleep study). This can detect any disruptions in your sleep. How is this treated? This condition is treated by managing the symptoms. This may include:  Lifestyle changes, such as exercising, using relaxation techniques, and avoiding caffeine, alcohol, or tobacco.  Medicines. Anti-seizure medicines may be tried first. Follow these instructions at home:     General instructions  Take over-the-counter and prescription medicines only as told by your health care provider.  Use methods to help relieve  the uncomfortable sensations, such as: ? Massaging your legs. ? Walking or stretching. ? Taking a cold or hot bath.  Keep all follow-up visits as told by your health care provider. This is important. Lifestyle  Practice good sleep habits. For example, go to bed and get up at the same time every day. Most adults should get 7-9 hours of sleep each night.  Exercise regularly. Try to get at least 30 minutes of exercise most days of the week.  Practice ways of relaxing, such as yoga or meditation.  Avoid caffeine and alcohol.  Do not use any products that contain nicotine or tobacco, such as cigarettes and e-cigarettes. If you need help quitting, ask your health care provider. Contact a health care provider if:  Your symptoms get worse or they do not improve with treatment. Summary  Restless legs syndrome is a condition that causes uncomfortable feelings or sensations in the legs, especially while sitting or lying down.  The symptoms often interfere with a person's ability to sleep.  This condition is treated by managing the symptoms. You may need to make lifestyle changes or take medicines. This information is not intended to replace advice given to you by your health care provider. Make sure you discuss any questions you have with your health care provider. Document Released: 07/10/2002  Document Revised: 08/09/2017 Document Reviewed: 08/09/2017 Elsevier Patient Education  2020 [employer].

## 2019-05-10 NOTE — Telephone Encounter (Signed)
Noted thanks for the update   [place]

## 2019-05-11 ENCOUNTER — Other Ambulatory Visit: Payer: Self-pay

## 2019-05-11 ENCOUNTER — Other Ambulatory Visit (HOSPITAL_COMMUNITY): Payer: Self-pay | Admitting: Student

## 2019-05-11 ENCOUNTER — Other Ambulatory Visit: Payer: Medicare Other

## 2019-05-11 DIAGNOSIS — N390 Urinary tract infection, site not specified: Secondary | ICD-10-CM

## 2019-05-11 DIAGNOSIS — B9620 Unspecified Escherichia coli [E. coli] as the cause of diseases classified elsewhere: Secondary | ICD-10-CM

## 2019-05-12 ENCOUNTER — Encounter: Payer: Self-pay | Admitting: Internal Medicine

## 2019-05-12 LAB — URINE CULTURE
MICRO NUMBER:: 969159
SPECIMEN QUALITY:: ADEQUATE

## 2019-05-14 ENCOUNTER — Encounter: Payer: Self-pay | Admitting: Internal Medicine

## 2019-05-15 ENCOUNTER — Telehealth: Payer: Self-pay | Admitting: Psychiatry

## 2019-05-15 ENCOUNTER — Encounter: Payer: Self-pay | Admitting: Internal Medicine

## 2019-05-15 ENCOUNTER — Other Ambulatory Visit: Payer: Self-pay | Admitting: Family Medicine

## 2019-05-15 NOTE — Telephone Encounter (Signed)
Either she takes flexeril of zanaflex  Which does she want to take?  TMs

## 2019-05-15 NOTE — Telephone Encounter (Signed)
Pt called to advise she's not doing well. May need to increase meds. Cost big issue. Please return her call [phone]

## 2019-05-15 NOTE — Telephone Encounter (Signed)
Last office visit 05/10/2019 for transition of care.  Last refilled 04/04/2019 for #30 with no refills by Dr. Frederico Hamman Copland.  Next Appt: 06/16/2019 for follow up.

## 2019-05-16 ENCOUNTER — Ambulatory Visit (HOSPITAL_COMMUNITY)
Admission: RE | Admit: 2019-05-16 | Discharge: 2019-05-16 | Disposition: A | Discharge status: Home / Self Care | Payer: Medicare Other | Source: Ambulatory Visit | Attending: Nurse Practitioner | Admitting: Nurse Practitioner

## 2019-05-16 ENCOUNTER — Other Ambulatory Visit: Payer: Self-pay

## 2019-05-16 ENCOUNTER — Other Ambulatory Visit: Payer: Self-pay | Admitting: Psychiatry

## 2019-05-16 ENCOUNTER — Other Ambulatory Visit: Payer: Self-pay | Admitting: Internal Medicine

## 2019-05-16 ENCOUNTER — Encounter (HOSPITAL_COMMUNITY): Payer: Self-pay | Admitting: Nurse Practitioner

## 2019-05-16 VITALS — BP 110/70 | HR 73 | Ht 69.5 in | Wt 173.2 lb

## 2019-05-16 DIAGNOSIS — Z888 Allergy status to other drugs, medicaments and biological substances status: Secondary | ICD-10-CM | POA: Insufficient documentation

## 2019-05-16 DIAGNOSIS — I480 Paroxysmal atrial fibrillation: Secondary | ICD-10-CM | POA: Insufficient documentation

## 2019-05-16 DIAGNOSIS — F419 Anxiety disorder, unspecified: Secondary | ICD-10-CM | POA: Insufficient documentation

## 2019-05-16 DIAGNOSIS — G2581 Restless legs syndrome: Secondary | ICD-10-CM

## 2019-05-16 DIAGNOSIS — E079 Disorder of thyroid, unspecified: Secondary | ICD-10-CM | POA: Insufficient documentation

## 2019-05-16 DIAGNOSIS — Z7901 Long term (current) use of anticoagulants: Secondary | ICD-10-CM | POA: Diagnosis not present

## 2019-05-16 DIAGNOSIS — R251 Tremor, unspecified: Secondary | ICD-10-CM

## 2019-05-16 DIAGNOSIS — Z8673 Personal history of transient ischemic attack (TIA), and cerebral infarction without residual deficits: Secondary | ICD-10-CM | POA: Insufficient documentation

## 2019-05-16 DIAGNOSIS — Z79890 Hormone replacement therapy (postmenopausal): Secondary | ICD-10-CM | POA: Insufficient documentation

## 2019-05-16 DIAGNOSIS — Z79899 Other long term (current) drug therapy: Secondary | ICD-10-CM | POA: Insufficient documentation

## 2019-05-16 NOTE — Telephone Encounter (Signed)
Pt. Returned call. Pt. Has been overwhelmed with her depression and anxiety. Pt. Has been going to group therapy but it is not helping her much. She feels sad, lonely, unable to get out and do things like she used to because of COVID. Unable to visit daughters that live in [place]. Has been crying a lot and feels like she is just not doing well. Would rather have an increase in Meds if possible rather than starting something new. Please advise.

## 2019-05-16 NOTE — Telephone Encounter (Signed)
OK trial of increase duloxetine to 60 mg twice daily.  But if this depression is entirely situational this may not make a diffference.

## 2019-05-16 NOTE — Progress Notes (Signed)
Primary Care Physician: McLean-Scocuzza, Nino Glow, MD Referring Physician: Dr. Felizardo Hoffmann Monica Whitaker is a 73 y.o. female with a h/o afib ablation that is in the clinic for f/u ablation 04/18/19. She just felt blah and was noting " low grade fevers" for her that were under 99 degrees.She went to Urgent Care at [facility] and was found to have UTI and possible sinus infection and was started on Omnicef. Culture showed E. Coli with 100,000 colonies and it was sensitive to [facility]. She is staying in [place]. Temp today is 97.4 degrees.   She is now back in afib clinic, 10/13 and is now feeling improved. Finished  antibiotics and UTI has resolved. Energy is now much better. No afib awareness. No swallowing or groin  issues.  Today, she denies symptoms of palpitations, chest pain, shortness of breath, orthopnea, PND, lower extremity edema, dizziness, presyncope, syncope, or neurologic sequela. The patient is tolerating medications without difficulties and is otherwise without complaint today.   Past Medical History:  Diagnosis Date  . Anxiety    controlled with meds  . Anxiety   . Arrhythmia   . Atrial fibrillation ([name])    paroxysmal, failed medical therapy with flecainide,  NSVT with tikosyn  . Bruises easily   . CVA (cerebral vascular accident) ([place]) 12/2007  . Depression    medically controlled   . Low blood pressure    no falls, but if gets up to fast  . Stroke ([place]) 7 years ago   Lasting effects on balance, different personality.  . Thyroid disease   . 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 ATRIAL FIBRILLATION ABLATION  10/18/12   PVI by Dr Rayann Heman  . ATRIAL FIBRILLATION ABLATION N/A 10/18/2012   Procedure: ATRIAL FIBRILLATION ABLATION;  Surgeon: Thompson Grayer, MD;  Location: [facility] CATH LAB;  Service: Cardiovascular;  Laterality: N/A;  . ATRIAL FIBRILLATION ABLATION N/A 04/18/2019   Procedure:  ATRIAL FIBRILLATION ABLATION;  Surgeon: Thompson Grayer, MD;  Location: [place] CV LAB;  Service: Cardiovascular;  Laterality: N/A;  . EYE SURGERY     on L/R eye, macular hole   . OPEN REDUCTION INTERNAL FIXATION (ORIF) DISTAL RADIAL FRACTURE Right 11/15/2014   Procedure: OPEN REDUCTION INTERNAL FIXATION (ORIF) RIGHT DISTAL RADIAL FRACTURE WITH ALLOGRAFT BONE GRAFT;  Surgeon: Roseanne Kaufman, MD;  Location: [place];  Service: Orthopedics;  Laterality: Right;  . TEE WITHOUT CARDIOVERSION N/A 10/18/2012   Procedure: TRANSESOPHAGEAL ECHOCARDIOGRAM (TEE);  Surgeon: Lelon Perla, MD;  Location: [facility] ENDOSCOPY;  Service: Cardiovascular;  Laterality: N/A;  Pre-Ablation at 12pm  . TONSILLECTOMY    . VAGINAL HYSTERECTOMY      Current Outpatient Medications  Medication Sig Dispense Refill  . acetaminophen (TYLENOL) 500 MG tablet Take 1,000 mg by mouth every 6 (six) hours as needed (pain). Take 2 tablets by mouth every 6 hours as needed for pain    . amphetamine-dextroamphetamine (ADDERALL XR) 10 MG 24 hr capsule Take 1 capsule (10 mg total) by mouth daily. 90 capsule 0  . apixaban (ELIQUIS) 5 MG TABS tablet Take 1 tablet (5 mg total) by mouth 2 (two) times daily. 180 tablet 1  . benzonatate (TESSALON) 100 MG capsule Take by mouth as needed for cough.    . calcium carbonate (OS-CAL) 600 MG TABS Take 600 mg by mouth daily with breakfast.     .  cholecalciferol (VITAMIN D) 1000 UNITS tablet Take 2,000 Units by mouth daily.     Marland Kitchen CRANBERRY EXTRACT PO Take by mouth.    . denosumab (PROLIA) 60 MG/ML SOLN injection Inject 60 mg into the skin every 6 (six) months.     . diltiazem (CARDIZEM CD) 240 MG 24 hr capsule TAKE 1 CAPSULE (240 MG TOTAL) BY MOUTH DAILY. MAY TAKE EXTRA CAPSULE DAILY AS NEEDED FOR AFIB 95 capsule 1  . DULoxetine (CYMBALTA) 30 MG capsule TAKE 1 CAPSULE BY MOUTH EVERY DAY along with 60 mg capsule 90 capsule 1  . DULoxetine (CYMBALTA) 60 MG capsule TAKE 1 CAPSULE (60 MG  TOTAL) BY MOUTH DAILY. TAKE WITH 30 MG DULOXETINE 90 capsule 1  . estradiol (ESTRACE VAGINAL) 0.1 MG/GM vaginal cream Apply 0.5mg  (pea-sized amount)  just inside the vaginal introitus with a finger-tip on Monday, Wednesday and Friday nights. 30 g 12  . flecainide (TAMBOCOR) 100 MG tablet TAKE 1 TABLET BY MOUTH TWICE A DAY 180 tablet 1  . levothyroxine (SYNTHROID) 25 MCG tablet TAKE 1 TABLET BY MOUTH EVERY DAY 90 tablet 0  . Melatonin ([employer] MELATONIN) 5 MG SUBL Place under the tongue.    . Multiple Vitamin (MULTIVITAMIN) tablet Take 1 tablet by mouth daily.      . pramipexole (MIRAPEX) 0.125 MG tablet TAKE 2 TABLETS BY MOUTH EVERY MORNING 180 tablet 0  . SALT SUBSTITUTES PO Take 1 tablet by mouth.    . timolol (TIMOPTIC) 0.5 % ophthalmic solution Place 1 drop into the left eye daily.     . vitamin B-12 (CYANOCOBALAMIN) 1000 MCG tablet Take 1,000 mcg by mouth daily.    . vitamin C (ASCORBIC ACID) 500 MG tablet Take 500 mg by mouth daily.    . vitamin E 400 UNIT capsule Take 400 Units by mouth daily.      . cyclobenzaprine (FLEXERIL) 10 MG tablet Take 5-10 mg by mouth at bedtime as needed for muscle spasms.    Marland Kitchen tiZANidine (ZANAFLEX) 2 MG tablet TAKE 1/2-1 TABLETS (1-2 MG TOTAL) BY MOUTH AT BEDTIME. (Patient not taking: No sig reported) 30 tablet 0   No current facility-administered medications for this encounter.     Allergies  Allergen Reactions  . Darvon Other (See Comments)    Severe panic attacks  . Propoxyphene Other (See Comments)    GI Upset Severe panic attacks GI Upset  . Propoxyphene N-Acetaminophen Other (See Comments)    Severe panic attacks  . Levofloxacin Anxiety and Other (See Comments)    Causes panic attacks.    Social History   Socioeconomic History  . Marital status: Widowed    Spouse name: Not on file  . Number of children: 0  . Years of education: Not on file  . Highest education level: Not on file  Occupational History  . Occupation: Retired    Fish farm manager:  RETIRED  Social Needs  . Financial resource strain: Not on file  . Food insecurity    Worry: Not on file    Inability: Not on file  . Transportation needs    Medical: Not on file    Non-medical: Not on file  Tobacco Use  . Smoking status: Never Smoker  . Smokeless tobacco: Never Used  Substance and Sexual Activity  . Alcohol use: Yes    Alcohol/week: 1.0 standard drinks    Types: 1 Standard drinks or equivalent per week    Comment: regular  . Drug use: No  . Sexual activity: Never  Lifestyle  . Physical activity    Days per week: Not on file    Minutes per session: Not on file  . Stress: Not on file  Relationships  . Social Herbalist on phone: Not on file    Gets together: Not on file    Attends religious service: Not on file    Active member of club or organization: Not on file    Attends meetings of clubs or organizations: Not on file    Relationship status: Not on file  . Intimate partner violence    Fear of current or ex partner: Not on file    Emotionally abused: Not on file    Physically abused: Not on file    Forced sexual activity: Not on file  Other Topics Concern  . Not on file  Social History Narrative   Lives in [place] with her husband.  No children.  Former Psychologist, prison and probation services   Enjoys exercise- water classes, yoga Editor, commissioning.       Has a living will-    Would desire CPR   Would not want prolonged life support if futile.    Family History  Problem Relation Age of Onset  . Multiple sclerosis Father   . Breast cancer Neg Hx     ROS- All systems are reviewed and negative except as per the HPI above  Physical Exam: Vitals:   05/16/19 1041  BP: 110/70  Pulse: 73  Weight: 78.6 kg  Height: 5' 9.5" (1.765 m)   Wt Readings from Last 3 Encounters:  05/16/19 78.6 kg  05/10/19 77.3 kg  05/03/19 78.6 kg    Labs: Lab Results  Component Value Date   NA 139 04/14/2019   K 4.7 04/14/2019   CL 105 04/14/2019   CO2 28 04/14/2019    GLUCOSE 92 04/14/2019   BUN 19 04/14/2019   CREATININE 0.59 04/14/2019   CALCIUM 8.7 (L) 04/14/2019   MG 2.1 04/16/2011   Lab Results  Component Value Date   INR 1.4 11/07/2014   Lab Results  Component Value Date   CHOL 219 (H) 05/10/2018   HDL 97.50 05/10/2018   LDLCALC 110 (H) 05/10/2018   TRIG 57.0 05/10/2018     GEN- The patient is well appearing, alert and oriented x 3 today.   Head- normocephalic, atraumatic Eyes-  Sclera clear, conjunctiva pink Ears- hearing intact Oropharynx- clear Neck- supple, no JVP Lymph- no cervical lymphadenopathy Lungs- Clear to ausculation bilaterally, normal work of breathing Heart- Regular rate and rhythm, no murmurs, rubs or gallops, PMI not laterally displaced GI- soft, NT, ND, + BS Extremities- no clubbing, cyanosis, or edema MS- no significant deformity or atrophy Skin- no rash or lesion Psych- euthymic mood, full affect Neuro- strength and sensation are intact  EKG- NSR with sinus arrhythmia at 73 bpm, pr int 182 ms, qrs int 94 ms, qtc 482  ms Epic records reviewed   Assessment and Plan: 1. Paroxsymal afib  S/p ablation and has been in SR since procedure No swallowing or groin issues Finished antibiotics for sinus and UTI and feeling much better  Continue eliquis 5 mg bid for a [id number] score of at least 4   F/u with Dr. Rayann Heman 12/14  Geroge Baseman. Schae Cando, [facility] [address] [place], [place] [address] [phone]

## 2019-05-16 NOTE — Telephone Encounter (Signed)
Patient called back and nurse unavailable, tried calling again left message to call back and ask for Albina Billet if she's available

## 2019-05-17 ENCOUNTER — Other Ambulatory Visit: Payer: Self-pay | Admitting: Internal Medicine

## 2019-05-17 NOTE — Telephone Encounter (Signed)
Unable to reach closing encounter 

## 2019-05-17 NOTE — Telephone Encounter (Signed)
Pt. Made aware and will give this a try.

## 2019-05-19 ENCOUNTER — Encounter: Payer: Self-pay | Admitting: Internal Medicine

## 2019-05-21 ENCOUNTER — Encounter: Payer: Self-pay | Admitting: Internal Medicine

## 2019-05-24 ENCOUNTER — Telehealth: Payer: Self-pay

## 2019-05-24 ENCOUNTER — Other Ambulatory Visit: Payer: Self-pay

## 2019-05-24 ENCOUNTER — Encounter: Payer: Self-pay | Admitting: Internal Medicine

## 2019-05-24 DIAGNOSIS — R102 Pelvic and perineal pain: Secondary | ICD-10-CM | POA: Insufficient documentation

## 2019-05-24 DIAGNOSIS — I4891 Unspecified atrial fibrillation: Secondary | ICD-10-CM | POA: Insufficient documentation

## 2019-05-24 DIAGNOSIS — F419 Anxiety disorder, unspecified: Secondary | ICD-10-CM | POA: Insufficient documentation

## 2019-05-24 DIAGNOSIS — G8929 Other chronic pain: Secondary | ICD-10-CM | POA: Insufficient documentation

## 2019-05-24 DIAGNOSIS — F39 Unspecified mood [affective] disorder: Secondary | ICD-10-CM | POA: Insufficient documentation

## 2019-05-24 MED ORDER — TRIAMCINOLONE ACETONIDE 0.1 % EX CREA: 1.0000 "application " | TOPICAL_CREAM | Freq: Two times a day (BID) | CUTANEOUS | 0 refills | Status: DC

## 2019-05-24 NOTE — Progress Notes (Signed)
Chief Complaint  Patient presents with  . [place] seen 05/10/2019   1. Tremor on exam, RLS on pamiprexole 0.125 and not helping so not taking and tried zanaflex 2 mg prn and flexeril prn by [facility] muscle relaxers help with RLS she reports advised cant be on both, h/o stroke and c/o numbness and tingling in extremities as well   -will refer to Dr. Delice Lesch where pts husband goes 2. Hypothyroidism on levo 25 mcg   3. S/p ablation 3 weeks ago with Dr. Rayann Heman for Afib x 2 on dilt 240 mg qd   4. Wants to get flu shot today   5. C/o right lower quadrant to mid ab pain no n/v but bloating sensation 6/10 worse x 3 weeks nothing tried   6. C/o leg edema and dry legs tries to drink enough water stopped lasix 20 mg   7. S/p stroke on Adderral per [facility] due to helps with attention post stroke per pt   Review of Systems  Constitutional: Negative for weight loss.  HENT: Negative for hearing loss.   Eyes: Negative for blurred vision.  Respiratory: Negative for shortness of breath.   Cardiovascular: Negative for chest pain.  Gastrointestinal: Positive for abdominal pain. Negative for blood in stool, nausea and vomiting.  Musculoskeletal: Negative for falls.  Skin: Positive for rash.  Neurological: Negative for headaches.  Psychiatric/Behavioral: Negative for depression.   Past Medical History:  Diagnosis Date  . Anxiety    controlled with meds  . Anxiety   . Arrhythmia   . Atrial fibrillation ([name])    paroxysmal, failed medical therapy with flecainide,  NSVT with tikosyn  . Bruises easily   . CVA (cerebral vascular accident) ([place]) 12/2007  . Depression    medically controlled   . Glaucoma    also with macular holes AE Dr. Thomasene Ripple  . Low blood pressure    no falls, but if gets up to fast  . RLS (restless legs syndrome)   . Stroke [facility]) 7 years ago   Lasting effects on balance, different personality.  . Thyroid disease   . Tremor   . 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 ATRIAL FIBRILLATION ABLATION  10/18/12   PVI by Dr Rayann Heman  . ATRIAL FIBRILLATION ABLATION N/A 10/18/2012   Procedure: ATRIAL FIBRILLATION ABLATION;  Surgeon: Thompson Grayer, MD;  Location: [facility] CATH LAB;  Service: Cardiovascular;  Laterality: N/A;  . ATRIAL FIBRILLATION ABLATION N/A 04/18/2019   Procedure: ATRIAL FIBRILLATION ABLATION;  Surgeon: Thompson Grayer, MD;  Location: [place] CV LAB;  Service: Cardiovascular;  Laterality: N/A;  . EYE SURGERY     on L/R eye, macular hole   . OPEN REDUCTION INTERNAL FIXATION (ORIF) DISTAL RADIAL FRACTURE Right 11/15/2014   Procedure: OPEN REDUCTION INTERNAL FIXATION (ORIF) RIGHT DISTAL RADIAL FRACTURE WITH ALLOGRAFT BONE GRAFT;  Surgeon: Roseanne Kaufman, MD;  Location: [place];  Service: Orthopedics;  Laterality: Right;  . TEE WITHOUT CARDIOVERSION N/A 10/18/2012   Procedure: TRANSESOPHAGEAL ECHOCARDIOGRAM (TEE);  Surgeon: Lelon Perla, MD;  Location: [facility] ENDOSCOPY;  Service: Cardiovascular;  Laterality: N/A;  Pre-Ablation at 12pm  . TONSILLECTOMY    . VAGINAL HYSTERECTOMY     Family History  Problem Relation Age of Onset  . Multiple sclerosis Father   . Breast cancer Neg Hx    Social History  Socioeconomic History  . Marital status: Widowed    Spouse name: Not on file  . Number of children: 0  . Years of education: Not on file  . Highest education level: Not on file  Occupational History  . Occupation: Retired    Fish farm manager: RETIRED  Social Needs  . Financial resource strain: Not on file  . Food insecurity    Worry: Not on file    Inability: Not on file  . Transportation needs    Medical: Not on file    Non-medical: Not on file  Tobacco Use  . Smoking status: Never Smoker  . Smokeless tobacco: Never Used  Substance and Sexual Activity  . Alcohol use: Yes    Alcohol/week: 1.0 standard drinks    Types: 1  Standard drinks or equivalent per week    Comment: regular  . Drug use: No  . Sexual activity: Never  Lifestyle  . Physical activity    Days per week: Not on file    Minutes per session: Not on file  . Stress: Not on file  Relationships  . Social Herbalist on phone: Not on file    Gets together: Not on file    Attends religious service: Not on file    Active member of club or organization: Not on file    Attends meetings of clubs or organizations: Not on file    Relationship status: Not on file  . Intimate partner violence    Fear of current or ex partner: Not on file    Emotionally abused: Not on file    Physically abused: Not on file    Forced sexual activity: Not on file  Other Topics Concern  . Not on file  Social History Narrative   Lives in [place] with her husband.  No children.  Former Psychologist, prison and probation services   Enjoys exercise- water classes, yoga Editor, commissioning.       Has a living will-    Would desire CPR   Would not want prolonged life support if futile.   Current Meds  Medication Sig  . [DISCONTINUED] cyclobenzaprine (FLEXERIL) 10 MG tablet Take 5-10 mg by mouth at bedtime as needed for muscle spasms.   Allergies  Allergen Reactions  . Darvon Other (See Comments)    Severe panic attacks  . Propoxyphene Other (See Comments)    GI Upset Severe panic attacks GI Upset  . Propoxyphene N-Acetaminophen Other (See Comments)    Severe panic attacks  . Levofloxacin Anxiety and Other (See Comments)    Causes panic attacks.   Recent Results (from the past 2160 hour(s))  POCT Urinalysis Dipstick (Automated)     Status: Abnormal   Collection Time: 03/29/19  3:04 PM  Result Value Ref Range   Color, UA yellow    Clarity, UA cloudy    Glucose, UA Negative Negative   Bilirubin, UA neg    Ketones, UA +    Spec Grav, UA 1.015 1.010 - 1.025   Blood, UA neg    pH, UA 6.0 5.0 - 8.0   Protein, UA Positive (A) Negative   Urobilinogen, UA 0.2 0.2 or 1.0  E.U./dL   Nitrite, UA neg    Leukocytes, UA Negative Negative  Urine Culture     Status: Abnormal   Collection Time: 03/29/19  3:15 PM   Specimen: Urine  Result Value Ref Range   MICRO NUMBER: [id number]    SPECIMEN QUALITY: Adequate    Sample Source  URINE    STATUS: FINAL    ISOLATE 1: Escherichia coli (A)     Comment: Greater than 100,000 CFU/mL of Escherichia coli      Susceptibility   Escherichia coli - URINE CULTURE, REFLEX    AMOX/CLAVULANIC 4 Sensitive     AMPICILLIN 8 Sensitive     AMPICILLIN/SULBACTAM 4 Sensitive     CEFAZOLIN* <=4 Not Reportable      * For infections other than uncomplicated UTIcaused by E. coli, K. pneumoniae or P. mirabilis:Cefazolin is resistant if MIC > or = 8 mcg/mL.(Distinguishing susceptible versus intermediatefor isolates with MIC < or = 4 mcg/mL requiresadditional testing.)For uncomplicated UTI caused by E. coli,K. pneumoniae or P. mirabilis: Cefazolin issusceptible if MIC <32 mcg/mL and predictssusceptible to the oral agents cefaclor, cefdinir,cefpodoxime, cefprozil, cefuroxime, cephalexinand loracarbef.    CEFEPIME <=1 Sensitive     CEFTRIAXONE <=1 Sensitive     CIPROFLOXACIN <=0.25 Sensitive     LEVOFLOXACIN <=0.12 Sensitive     ERTAPENEM <=0.5 Sensitive     GENTAMICIN <=1 Sensitive     IMIPENEM <=0.25 Sensitive     NITROFURANTOIN <=16 Sensitive     PIP/TAZO <=4 Sensitive     TOBRAMYCIN <=1 Sensitive     TRIMETH/SULFA* <=20 Sensitive      * For infections other than uncomplicated UTIcaused by E. coli, K. pneumoniae or P. mirabilis:Cefazolin is resistant if MIC > or = 8 mcg/mL.(Distinguishing susceptible versus intermediatefor isolates with MIC < or = 4 mcg/mL requiresadditional testing.)For uncomplicated UTI caused by E. coli,K. pneumoniae or P. mirabilis: Cefazolin issusceptible if MIC <32 mcg/mL and predictssusceptible to the oral agents cefaclor, cefdinir,cefpodoxime, cefprozil, cefuroxime, cephalexinand loracarbef.Legend:S = Susceptible  I =  IntermediateR = Resistant  NS = Not susceptible* = Not tested  NR = Not reported**NN = See antimicrobic comments  I-STAT creatinine     Status: None   Collection Time: 04/13/19 10:08 AM  Result Value Ref Range   Creatinine, Ser 0.50 0.44 - 1.00 mg/dL  CBC w/Diff     Status: None   Collection Time: 04/14/19 10:42 AM  Result Value Ref Range   WBC 4.9 4.0 - 10.5 K/uL   RBC 4.41 3.87 - 5.11 MIL/uL   Hemoglobin 13.1 12.0 - 15.0 g/dL   HCT 40.4 36.0 - 46.0 %   MCV 91.6 80.0 - 100.0 fL   MCH 29.7 26.0 - 34.0 pg   MCHC 32.4 30.0 - 36.0 g/dL   RDW 14.5 11.5 - 15.5 %   Platelets 304 150 - 400 K/uL   nRBC 0.0 0.0 - 0.2 %   Neutrophils Relative % 40 %   Neutro Abs 1.9 1.7 - 7.7 K/uL   Lymphocytes Relative 37 %   Lymphs Abs 1.8 0.7 - 4.0 K/uL   Monocytes Relative 15 %   Monocytes Absolute 0.8 0.1 - 1.0 K/uL   Eosinophils Relative 7 %   Eosinophils Absolute 0.4 0.0 - 0.5 K/uL   Basophils Relative 1 %   Basophils Absolute 0.0 0.0 - 0.1 K/uL   Immature Granulocytes 0 %   Abs Immature Granulocytes 0.01 0.00 - 0.07 K/uL    Comment: Performed at [facility], [address] [place], [place] [id number]  Basic Metabolic Panel (BMET)     Status: Abnormal   Collection Time: 04/14/19 10:42 AM  Result Value Ref Range   Sodium 139 135 - 145 mmol/L   Potassium 4.7 3.5 - 5.1 mmol/L   Chloride 105 98 - 111 mmol/L   CO2 28  22 - 32 mmol/L   Glucose, Bld 92 70 - 99 mg/dL   BUN 19 8 - 23 mg/dL   Creatinine, Ser 0.59 0.44 - 1.00 mg/dL   Calcium 8.7 (L) 8.9 - 10.3 mg/dL   GFR calc non Af Amer >60 >60 mL/min   GFR calc Af Amer >60 >60 mL/min   Anion gap 6 5 - 15    Comment: Performed at [facility], [address] [place], [place] [address]  SARS CORONAVIRUS 2 (TAT 6-24 HRS) Nasopharyngeal Nasopharyngeal Swab     Status: None   Collection Time: 04/14/19  1:03 PM   Specimen: Nasopharyngeal Swab  Result Value Ref Range   SARS Coronavirus 2 NEGATIVE NEGATIVE    Comment:  (NOTE) SARS-CoV-2 target nucleic acids are NOT DETECTED. The SARS-CoV-2 RNA is generally detectable in upper and lower respiratory specimens during the acute phase of infection. Negative results do not preclude SARS-CoV-2 infection, do not rule out co-infections with other pathogens, and should not be used as the sole basis for treatment or other patient management decisions. Negative results must be combined with clinical observations, patient history, and epidemiological information. The expected result is Negative. Fact Sheet for Patients: SugarRoll.be Fact Sheet for Healthcare Providers: https://www.woods-mathews.com/ This test is not yet approved or cleared by the [place] FDA and  has been authorized for detection and/or diagnosis of SARS-CoV-2 by FDA under an Emergency Use Authorization (EUA). This EUA will remain  in effect (meaning this test can be used) for the duration of the COVID-19 declaration under Section 56 4(b)(1) of the Act, 21 U.S.C. section 360bbb-3(b)(1), unless the authorization is terminated or revoked sooner. Performed at [facility], [place] [address], [place], [place] [address]   POCT Activated clotting time     Status: None   Collection Time: 04/18/19  3:04 PM  Result Value Ref Range   Activated Clotting Time 246 seconds  Novel Coronavirus, NAA ([facility])     Status: None   Collection Time: 04/26/19 12:00 AM   Specimen: Oropharyngeal(OP) collection in vial transport medium   OROPHARYNGEA  TESTING  Result Value Ref Range   SARS-CoV-2, NAA Not Detected Not Detected    Comment: This nucleic acid amplification test was developed and its performance characteristics determined by [employer]. Nucleic acid amplification tests include PCR and TMA. This test has not been FDA cleared or approved. This test has been authorized by FDA under an Emergency Use Authorization (EUA). This test is only authorized  for the duration of time the declaration that circumstances exist justifying the authorization of the emergency use of in vitro diagnostic tests for detection of SARS-CoV-2 virus and/or diagnosis of COVID-19 infection under section 564(b)(1) of the Act, 21 U.S.C. [id number]) (1), unless the authorization is terminated or revoked sooner. When diagnostic testing is negative, the possibility of a false negative result should be considered in the context of a patient's recent exposures and the presence of clinical signs and symptoms consistent with COVID-19. An individual without symptoms of COVID-19 and who is not shedding SARS-CoV-2 virus would  expect to have a negative (not detected) result in this assay.   Urine Culture     Status: None   Collection Time: 05/11/19 10:22 AM   Specimen: Urine  Result Value Ref Range   MICRO NUMBER: [id number]    SPECIMEN QUALITY: Adequate    Sample Source NOT GIVEN    STATUS: FINAL    ISOLATE 1:      Less  than 10,000 CFU/mL of single Gram positive organism isolated. No further testing will be performed. If clinically indicated, recollection using a method to minimize contamination, with prompt transfer to Urine Culture Transport Tube, is recommended.   Objective  Body mass index is 24.8 kg/m. Wt Readings from Last 3 Encounters:  05/16/19 173 lb 3.2 oz (78.6 kg)  05/10/19 170 lb 6.4 oz (77.3 kg)  05/03/19 173 lb 4.8 oz (78.6 kg)   Temp Readings from Last 3 Encounters:  05/10/19 97.6 F (36.4 C) (Oral)  04/26/19 98.8 F (37.1 C) (Oral)  04/24/19 97.8 F (36.6 C) (Oral)   BP Readings from Last 3 Encounters:  05/16/19 110/70  05/10/19 98/70  05/03/19 112/66   Pulse Readings from Last 3 Encounters:  05/16/19 73  05/10/19 86  05/03/19 71    Physical Exam Vitals signs and nursing note reviewed.  Constitutional:      Appearance: Normal appearance. She is well-developed and well-groomed.     Comments: +mask on    HENT:     Head:  Normocephalic and atraumatic.  Eyes:     Conjunctiva/sclera: Conjunctivae normal.     Pupils: Pupils are equal, round, and reactive to light.  Cardiovascular:     Rate and Rhythm: Normal rate and regular rhythm.     Heart sounds: Normal heart sounds. No murmur.     Comments: Not in afib 1-2 + leg edema b/l  Pulmonary:     Effort: Pulmonary effort is normal.     Breath sounds: Normal breath sounds.  Abdominal:     General: Abdomen is flat. Bowel sounds are normal.     Tenderness: There is abdominal tenderness.  Skin:    General: Skin is warm and dry.  Neurological:     General: No focal deficit present.     Mental Status: She is alert and oriented to person, place, and time. Mental status is at baseline.     Motor: Tremor present.     Gait: Gait normal.  Psychiatric:        Attention and Perception: Attention and perception normal.        Mood and Affect: Mood and affect normal.        Speech: Speech normal.        Behavior: Behavior normal. Behavior is cooperative.        Thought Content: Thought content normal.        Cognition and Memory: Cognition and memory normal.        Judgment: Judgment normal.     Assessment  Plan  Chronic female pelvic pain - Plan: CT ABDOMEN PELVIS W CONTRAST   E. coli UTI - Plan: repeat urine culture negative   Atrial fibrillation, unspecified type ([place]) - Plan: Hepatic function panel, TSH Cont f/u [facility] and meds   Anxiety f/u psych   Tremor RLS (restless legs syndrome) - Plan: Iron, TIBC and Ferritin Panel disc neuropro too expensive vs ropinerole will stop mirapex not helping and flexeril and zanaflex until neurology f/u  Neuropathy  H/o stroke Pending neurology referral   Osteoporosis, unspecified osteoporosis type, unspecified pathological fracture presence - Plan: DG Bone Density  Acquired hypothyroidism - Plan: Hepatic function panel Cont meds   Venous stasis dermatitis of both lower extremities - Plan: triamcinolone cream  (KENALOG) 0.1 %  HM Flu shot utd given today  utd prevnar  pna 23 due 04/2020  Tdap utd  shingrix had 2/2 shots  mammo referred due 07/26/19  Colonoscopy had 03/06/14  diverticulosis  02/17/16 osteoporosis reordered another  dexa + osteoporosis next prolia shot due 06/2019  Skin [facility] derm pt to call  Echo had 04/11/2019 ef 60-65%  She is doing [facility] PT at [facility]  Provider: Dr. Olivia Mackie McLean-Scocuzza-[facility] Medicine

## 2019-05-24 NOTE — Telephone Encounter (Signed)
No longer my pt

## 2019-05-24 NOTE — Telephone Encounter (Signed)
Pt due Prolia 04-14-19.  Needs calcium lab before next injection.

## 2019-05-25 ENCOUNTER — Encounter: Payer: Self-pay | Admitting: Internal Medicine

## 2019-05-25 ENCOUNTER — Ambulatory Visit (INDEPENDENT_AMBULATORY_CARE_PROVIDER_SITE_OTHER): Payer: Medicare Other | Admitting: Internal Medicine

## 2019-05-25 ENCOUNTER — Other Ambulatory Visit: Payer: Self-pay

## 2019-05-25 VITALS — BP 116/68 | HR 81 | Temp 97.7°F | Ht 69.5 in | Wt 171.6 lb

## 2019-05-25 DIAGNOSIS — E039 Hypothyroidism, unspecified: Secondary | ICD-10-CM | POA: Diagnosis not present

## 2019-05-25 DIAGNOSIS — R202 Paresthesia of skin: Secondary | ICD-10-CM

## 2019-05-25 DIAGNOSIS — I639 Cerebral infarction, unspecified: Secondary | ICD-10-CM

## 2019-05-25 DIAGNOSIS — F419 Anxiety disorder, unspecified: Secondary | ICD-10-CM

## 2019-05-25 DIAGNOSIS — R251 Tremor, unspecified: Secondary | ICD-10-CM

## 2019-05-25 DIAGNOSIS — G2581 Restless legs syndrome: Secondary | ICD-10-CM

## 2019-05-25 MED ORDER — DULOXETINE HCL 60 MG PO CPEP: 60.0000 mg | ORAL_CAPSULE | Freq: Two times a day (BID) | ORAL | 1 refills | Status: DC

## 2019-05-25 MED ORDER — LEVOTHYROXINE SODIUM 25 MCG PO TABS: 25.0000 ug | ORAL_TABLET | Freq: Every day | ORAL | 3 refills | Status: DC

## 2019-05-25 NOTE — Progress Notes (Signed)
Chief Complaint  Patient presents with  . Follow-up   F/u  1.anxiety about tremor and c/w parkinsons and wants to change neurology referral to Dr. Manuella Ghazi instead of Dr. Carles Collet 2. H/o anxiety and mild depression GAD 7 socre 7 and PHQ 9 score 7 f/u Dr. Clovis Pu via televisit  3. H/o stroke, RLS, tremor and tingling in b/l feet wants to see neurology  4. Grief husband died in 11-16-17 and lives alone and step kids out of state 46. B/l lower ab pain RLQ and mid abdomen CT scan pending w/in the next week to w/u and also c/o bloating    Review of Systems  Constitutional: Negative for weight loss.  HENT: Negative for hearing loss.   Eyes: Negative for blurred vision.  Respiratory: Negative for shortness of breath.   Cardiovascular: Negative for chest pain.  Gastrointestinal: Positive for abdominal pain.  Musculoskeletal: Negative for falls.  Skin: Negative for rash.  Neurological: Positive for tingling and tremors.  Psychiatric/Behavioral: The patient is nervous/anxious.    Past Medical History:  Diagnosis Date  . Anxiety    controlled with meds  . Anxiety   . Arrhythmia   . Atrial fibrillation ([name])    paroxysmal, failed medical therapy with flecainide,  NSVT with tikosyn  . Bruises easily   . CVA (cerebral vascular accident) ([place]) 12/2007  . Depression    medically controlled   . Glaucoma    also with macular holes AE Dr. Thomasene Ripple  . Low blood pressure    no falls, but if gets up to fast  . RLS (restless legs syndrome)   . Stroke [facility]) 7 years ago   Lasting effects on balance, different personality.  . Thyroid disease   . Tremor   . 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 ATRIAL FIBRILLATION ABLATION  10/18/12   PVI by Dr Rayann Heman  . ATRIAL FIBRILLATION ABLATION N/A 10/18/2012   Procedure: ATRIAL FIBRILLATION ABLATION;  Surgeon: Thompson Grayer, MD;  Location: [facility] CATH LAB;  Service: Cardiovascular;   Laterality: N/A;  . ATRIAL FIBRILLATION ABLATION N/A 04/18/2019   Procedure: ATRIAL FIBRILLATION ABLATION;  Surgeon: Thompson Grayer, MD;  Location: [facility] CV LAB;  Service: Cardiovascular;  Laterality: N/A;  . EYE SURGERY     on L/R eye, macular hole   . OPEN REDUCTION INTERNAL FIXATION (ORIF) DISTAL RADIAL FRACTURE Right 11/15/2014   Procedure: OPEN REDUCTION INTERNAL FIXATION (ORIF) RIGHT DISTAL RADIAL FRACTURE WITH ALLOGRAFT BONE GRAFT;  Surgeon: Roseanne Kaufman, MD;  Location: [place];  Service: Orthopedics;  Laterality: Right;  . TEE WITHOUT CARDIOVERSION N/A 10/18/2012   Procedure: TRANSESOPHAGEAL ECHOCARDIOGRAM (TEE);  Surgeon: Lelon Perla, MD;  Location: [facility] ENDOSCOPY;  Service: Cardiovascular;  Laterality: N/A;  Pre-Ablation at 12pm  . TONSILLECTOMY    . VAGINAL HYSTERECTOMY     Family History  Problem Relation Age of Onset  . Multiple sclerosis Father   . Breast cancer Neg Hx    Social History   Socioeconomic History  . Marital status: Widowed    Spouse name: Not on file  . Number of children: 0  . Years of education: Not on file  . Highest education level: Not on file  Occupational History  . Occupation: Retired    Fish farm manager: RETIRED  Social Needs  . Financial resource strain: Not on file  . Food insecurity    Worry: Not on  file    Inability: Not on file  . Transportation needs    Medical: Not on file    Non-medical: Not on file  Tobacco Use  . Smoking status: Never Smoker  . Smokeless tobacco: Never Used  Substance and Sexual Activity  . Alcohol use: Yes    Alcohol/week: 1.0 standard drinks    Types: 1 Standard drinks or equivalent per week    Comment: regular  . Drug use: No  . Sexual activity: Never  Lifestyle  . Physical activity    Days per week: Not on file    Minutes per session: Not on file  . Stress: Not on file  Relationships  . Social Herbalist on phone: Not on file    Gets together: Not on file    Attends  religious service: Not on file    Active member of club or organization: Not on file    Attends meetings of clubs or organizations: Not on file    Relationship status: Not on file  . Intimate partner violence    Fear of current or ex partner: Not on file    Emotionally abused: Not on file    Physically abused: Not on file    Forced sexual activity: Not on file  Other Topics Concern  . Not on file  Social History Narrative   Lives in [place] with her husband.  No children 2 step kids.  Former Psychologist, prison and probation services   Enjoys exercise- water classes, yoga Editor, commissioning.    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   Current Meds  Medication Sig  . acetaminophen (TYLENOL) 500 MG tablet Take 1,000 mg by mouth every 6 (six) hours as needed (pain). Take 2 tablets by mouth every 6 hours as needed for pain  . amphetamine-dextroamphetamine (ADDERALL XR) 10 MG 24 hr capsule Take 1 capsule (10 mg total) by mouth daily.  Marland Kitchen apixaban (ELIQUIS) 5 MG TABS tablet Take 1 tablet (5 mg total) by mouth 2 (two) times daily.  . benzonatate (TESSALON) 100 MG capsule Take by mouth as needed for cough.  . calcium carbonate (OS-CAL) 600 MG TABS Take 600 mg by mouth daily with breakfast.   . cholecalciferol (VITAMIN D) 1000 UNITS tablet Take 2,000 Units by mouth daily.   Marland Kitchen CRANBERRY EXTRACT PO Take by mouth.  . denosumab (PROLIA) 60 MG/ML SOLN injection Inject 60 mg into the skin every 6 (six) months.   . diltiazem (CARDIZEM CD) 240 MG 24 hr capsule TAKE 1 CAPSULE (240 MG TOTAL) BY MOUTH DAILY. MAY TAKE EXTRA CAPSULE DAILY AS NEEDED FOR AFIB  . DULoxetine (CYMBALTA) 60 MG capsule Take 1 capsule (60 mg total) by mouth 2 (two) times daily. Take with 30 mg duloxetine  . estradiol (ESTRACE VAGINAL) 0.1 MG/GM vaginal cream Apply 0.5mg  (pea-sized amount)  just inside the vaginal introitus with a finger-tip on Monday,  Wednesday and Friday nights.  . flecainide (TAMBOCOR) 100 MG tablet TAKE 1 TABLET BY MOUTH TWICE A DAY  . levothyroxine (SYNTHROID) 25 MCG tablet Take 1 tablet (25 mcg total) by mouth daily. 30 minutes before food  . Melatonin ([employer] MELATONIN) 5 MG SUBL Place under the tongue.  . Multiple Vitamin (MULTIVITAMIN) tablet Take 1 tablet by mouth daily.    Marland Kitchen SALT SUBSTITUTES PO Take 1 tablet  by mouth.  . timolol (TIMOPTIC) 0.5 % ophthalmic solution Place 1 drop into the left eye daily.   Marland Kitchen triamcinolone cream (KENALOG) 0.1 % Apply 1 application topically 2 (two) times daily. Lower legs prn red dry rash  . vitamin B-12 (CYANOCOBALAMIN) 1000 MCG tablet Take 1,000 mcg by mouth daily.  . vitamin C (ASCORBIC ACID) 500 MG tablet Take 500 mg by mouth daily.  . vitamin E 400 UNIT capsule Take 400 Units by mouth daily.    . [DISCONTINUED] DULoxetine (CYMBALTA) 30 MG capsule TAKE 1 CAPSULE BY MOUTH EVERY DAY along with 60 mg capsule  . [DISCONTINUED] DULoxetine (CYMBALTA) 60 MG capsule TAKE 1 CAPSULE (60 MG TOTAL) BY MOUTH DAILY. TAKE WITH 30 MG DULOXETINE  . [DISCONTINUED] levothyroxine (SYNTHROID) 25 MCG tablet TAKE 1 TABLET BY MOUTH EVERY DAY  . [DISCONTINUED] pramipexole (MIRAPEX) 0.125 MG tablet TAKE 2 TABLETS BY MOUTH EVERY MORNING  . [DISCONTINUED] tiZANidine (ZANAFLEX) 2 MG tablet TAKE 1/2-1 TABLETS (1-2 MG TOTAL) BY MOUTH AT BEDTIME.   Allergies  Allergen Reactions  . Darvon Other (See Comments)    Severe panic attacks  . Propoxyphene Other (See Comments)    GI Upset Severe panic attacks GI Upset  . Propoxyphene N-Acetaminophen Other (See Comments)    Severe panic attacks  . Levofloxacin Anxiety and Other (See Comments)    Causes panic attacks.   Recent Results (from the past 2160 hour(s))  POCT Urinalysis Dipstick (Automated)     Status: Abnormal   Collection Time: 03/29/19  3:04 PM  Result Value Ref Range   Color, UA yellow    Clarity, UA cloudy    Glucose, UA Negative Negative    Bilirubin, UA neg    Ketones, UA +    Spec Grav, UA 1.015 1.010 - 1.025   Blood, UA neg    pH, UA 6.0 5.0 - 8.0   Protein, UA Positive (A) Negative   Urobilinogen, UA 0.2 0.2 or 1.0 E.U./dL   Nitrite, UA neg    Leukocytes, UA Negative Negative  Urine Culture     Status: Abnormal   Collection Time: 03/29/19  3:15 PM   Specimen: Urine  Result Value Ref Range   MICRO NUMBER: [id number]    SPECIMEN QUALITY: Adequate    Sample Source URINE    STATUS: FINAL    ISOLATE 1: Escherichia coli (A)     Comment: Greater than 100,000 CFU/mL of Escherichia coli      Susceptibility   Escherichia coli - URINE CULTURE, REFLEX    AMOX/CLAVULANIC 4 Sensitive     AMPICILLIN 8 Sensitive     AMPICILLIN/SULBACTAM 4 Sensitive     CEFAZOLIN* <=4 Not Reportable      * For infections other than uncomplicated UTIcaused by E. coli, K. pneumoniae or P. mirabilis:Cefazolin is resistant if MIC > or = 8 mcg/mL.(Distinguishing susceptible versus intermediatefor isolates with MIC < or = 4 mcg/mL requiresadditional testing.)For uncomplicated UTI caused by E. coli,K. pneumoniae or P. mirabilis: Cefazolin issusceptible if MIC <32 mcg/mL and predictssusceptible to the oral agents cefaclor, cefdinir,cefpodoxime, cefprozil, cefuroxime, cephalexinand loracarbef.    CEFEPIME <=1 Sensitive     CEFTRIAXONE <=1 Sensitive     CIPROFLOXACIN <=0.25 Sensitive     LEVOFLOXACIN <=0.12 Sensitive     ERTAPENEM <=0.5 Sensitive     GENTAMICIN <=1 Sensitive     IMIPENEM <=0.25 Sensitive     NITROFURANTOIN <=16 Sensitive     PIP/TAZO <=4 Sensitive     TOBRAMYCIN <=1 Sensitive  TRIMETH/SULFA* <=20 Sensitive      * For infections other than uncomplicated UTIcaused by E. coli, K. pneumoniae or P. mirabilis:Cefazolin is resistant if MIC > or = 8 mcg/mL.(Distinguishing susceptible versus intermediatefor isolates with MIC < or = 4 mcg/mL requiresadditional testing.)For uncomplicated UTI caused by E. coli,K. pneumoniae or P. mirabilis:  Cefazolin issusceptible if MIC <32 mcg/mL and predictssusceptible to the oral agents cefaclor, cefdinir,cefpodoxime, cefprozil, cefuroxime, cephalexinand loracarbef.Legend:S = Susceptible  I = IntermediateR = Resistant  NS = Not susceptible* = Not tested  NR = Not reported**NN = See antimicrobic comments  I-STAT creatinine     Status: None   Collection Time: 04/13/19 10:08 AM  Result Value Ref Range   Creatinine, Ser 0.50 0.44 - 1.00 mg/dL  CBC w/Diff     Status: None   Collection Time: 04/14/19 10:42 AM  Result Value Ref Range   WBC 4.9 4.0 - 10.5 K/uL   RBC 4.41 3.87 - 5.11 MIL/uL   Hemoglobin 13.1 12.0 - 15.0 g/dL   HCT 40.4 36.0 - 46.0 %   MCV 91.6 80.0 - 100.0 fL   MCH 29.7 26.0 - 34.0 pg   MCHC 32.4 30.0 - 36.0 g/dL   RDW 14.5 11.5 - 15.5 %   Platelets 304 150 - 400 K/uL   nRBC 0.0 0.0 - 0.2 %   Neutrophils Relative % 40 %   Neutro Abs 1.9 1.7 - 7.7 K/uL   Lymphocytes Relative 37 %   Lymphs Abs 1.8 0.7 - 4.0 K/uL   Monocytes Relative 15 %   Monocytes Absolute 0.8 0.1 - 1.0 K/uL   Eosinophils Relative 7 %   Eosinophils Absolute 0.4 0.0 - 0.5 K/uL   Basophils Relative 1 %   Basophils Absolute 0.0 0.0 - 0.1 K/uL   Immature Granulocytes 0 %   Abs Immature Granulocytes 0.01 0.00 - 0.07 K/uL    Comment: Performed at [facility], [address] [place], [place] [id number]  Basic Metabolic Panel (BMET)     Status: Abnormal   Collection Time: 04/14/19 10:42 AM  Result Value Ref Range   Sodium 139 135 - 145 mmol/L   Potassium 4.7 3.5 - 5.1 mmol/L   Chloride 105 98 - 111 mmol/L   CO2 28 22 - 32 mmol/L   Glucose, Bld 92 70 - 99 mg/dL   BUN 19 8 - 23 mg/dL   Creatinine, Ser 0.59 0.44 - 1.00 mg/dL   Calcium 8.7 (L) 8.9 - 10.3 mg/dL   GFR calc non Af Amer >60 >60 mL/min   GFR calc Af Amer >60 >60 mL/min   Anion gap 6 5 - 15    Comment: Performed at [facility], [address] [place], [place] [address]  SARS CORONAVIRUS 2 (TAT 6-24 HRS) Nasopharyngeal  Nasopharyngeal Swab     Status: None   Collection Time: 04/14/19  1:03 PM   Specimen: Nasopharyngeal Swab  Result Value Ref Range   SARS Coronavirus 2 NEGATIVE NEGATIVE    Comment: (NOTE) SARS-CoV-2 target nucleic acids are NOT DETECTED. The SARS-CoV-2 RNA is generally detectable in upper and lower respiratory specimens during the acute phase of infection. Negative results do not preclude SARS-CoV-2 infection, do not rule out co-infections with other pathogens, and should not be used as the sole basis for treatment or other patient management decisions. Negative results must be combined with clinical observations, patient history, and epidemiological information. The expected result is Negative. Fact Sheet for Patients: SugarRoll.be Fact Sheet for  Healthcare Providers: https://www.woods-mathews.com/ This test is not yet approved or cleared by the [place] and  has been authorized for detection and/or diagnosis of SARS-CoV-2 by FDA under an Emergency Use Authorization (EUA). This EUA will remain  in effect (meaning this test can be used) for the duration of the COVID-19 declaration under Section 56 4(b)(1) of the Act, 21 U.S.C. section 360bbb-3(b)(1), unless the authorization is terminated or revoked sooner. Performed at [facility], [place] [address], [place], [place] [address]   POCT Activated clotting time     Status: None   Collection Time: 04/18/19  3:04 PM  Result Value Ref Range   Activated Clotting Time 246 seconds  Novel Coronavirus, NAA ([facility])     Status: None   Collection Time: 04/26/19 12:00 AM   Specimen: Oropharyngeal(OP) collection in vial transport medium   OROPHARYNGEA  TESTING  Result Value Ref Range   SARS-CoV-2, NAA Not Detected Not Detected    Comment: This nucleic acid amplification test was developed and its performance characteristics determined by [employer]. Nucleic acid amplification  tests include PCR and TMA. This test has not been FDA cleared or approved. This test has been authorized by FDA under an Emergency Use Authorization (EUA). This test is only authorized for the duration of time the declaration that circumstances exist justifying the authorization of the emergency use of in vitro diagnostic tests for detection of SARS-CoV-2 virus and/or diagnosis of COVID-19 infection under section 564(b)(1) of the Act, 21 U.S.C. [id number]) (1), unless the authorization is terminated or revoked sooner. When diagnostic testing is negative, the possibility of a false negative result should be considered in the context of a patient's recent exposures and the presence of clinical signs and symptoms consistent with COVID-19. An individual without symptoms of COVID-19 and who is not shedding SARS-CoV-2 virus would  expect to have a negative (not detected) result in this assay.   Urine Culture     Status: None   Collection Time: 05/11/19 10:22 AM   Specimen: Urine  Result Value Ref Range   MICRO NUMBER: [id number]    SPECIMEN QUALITY: Adequate    Sample Source NOT GIVEN    STATUS: FINAL    ISOLATE 1:      Less than 10,000 CFU/mL of single Gram positive organism isolated. No further testing will be performed. If clinically indicated, recollection using a method to minimize contamination, with prompt transfer to Urine Culture Transport Tube, is recommended.   Objective  Body mass index is 24.98 kg/m. Wt Readings from Last 3 Encounters:  05/25/19 171 lb 9.6 oz (77.8 kg)  05/16/19 173 lb 3.2 oz (78.6 kg)  05/10/19 170 lb 6.4 oz (77.3 kg)   Temp Readings from Last 3 Encounters:  05/25/19 97.7 F (36.5 C) (Skin)  05/10/19 97.6 F (36.4 C) (Oral)  04/26/19 98.8 F (37.1 C) (Oral)   BP Readings from Last 3 Encounters:  05/25/19 116/68  05/16/19 110/70  05/10/19 98/70   Pulse Readings from Last 3 Encounters:  05/25/19 81  05/16/19 73  05/10/19 86    Physical  Exam Vitals signs and nursing note reviewed.  Constitutional:      Appearance: Normal appearance. She is well-developed and well-groomed.     Comments: +mask on    HENT:     Head: Normocephalic and atraumatic.  Eyes:     Conjunctiva/sclera: Conjunctivae normal.     Pupils: Pupils are equal, round, and reactive to light.  Cardiovascular:  Rate and Rhythm: Normal rate and regular rhythm.     Heart sounds: Normal heart sounds. No murmur.  Pulmonary:     Effort: Pulmonary effort is normal.     Breath sounds: Normal breath sounds.  Abdominal:     General: Abdomen is flat. Bowel sounds are normal.  Neurological:     Mental Status: She is alert and oriented to person, place, and time.     Motor: Tremor present.     Gait: Gait normal.  Psychiatric:        Attention and Perception: Attention and perception normal.        Mood and Affect: Mood and affect normal.        Speech: Speech normal.        Behavior: Behavior normal. Behavior is cooperative.        Thought Content: Thought content normal.        Cognition and Memory: Cognition and memory normal.        Judgment: Judgment normal.     Assessment  Plan  Anxiety - Plan: DULoxetine (CYMBALTA) 60 MG capsule bid  Hypothyroidism, unspecified type - Plan: levothyroxine (SYNTHROID) 25 MCG tablet  RLS (restless legs syndrome) - Plan: Ambulatory referral to Neurology Dr. Manuella Ghazi consider EMG/NCS in future for tingling in legs R>L Cerebrovascular accident (CVA), unspecified mechanism ([place]) - Plan: Ambulatory referral to Neurology Tremor - Plan: Ambulatory referral to Neurology Tingling of both feet - Plan: Ambulatory referral to Neurology   HM Flu shot utd  utd prevnar  pna 23 due 04/2020  Tdap utd  shingrix had 2/2 shots  mammo referred due 07/26/19  Colonoscopy had 03/06/14 diverticulosis  02/17/16 osteoporosis reordered another  dexa + osteoporosis next prolia shot due 06/2019  Skin [facility] derm pt to call  Echo had 04/11/2019 ef  60-65%  She is doing exercise PT at [facility]  Provider: Dr. Olivia Mackie McLean-Scocuzza-Internal Medicine

## 2019-05-25 NOTE — Patient Instructions (Addendum)
Dr. Manuella Ghazi [facility]  [place]  [id number]   Essential Tremor A tremor is trembling or shaking that a person cannot control. Most tremors affect the hands or arms. Tremors can also affect the head, vocal cords, legs, and other parts of the body. Essential tremor is a tremor without a known cause. Usually, it occurs while a person is trying to perform an action. It tends to get worse gradually as a person ages. What are the causes? The cause of this condition is not known. What increases the risk? You are more likely to develop this condition if:  You have a family member with essential tremor.  You are age 72 or older.  You take certain medicines. What are the signs or symptoms? The main sign of a tremor is a rhythmic shaking of certain parts of your body that is uncontrolled and unintentional. You may:  Have difficulty eating with a spoon or fork.  Have difficulty writing.  Nod your head up and down or side to side.  Have a quivering voice. The shaking may:  Get worse over time.  Come and go.  Be more noticeable on one side of your body.  Get worse due to stress, fatigue, caffeine, and extreme heat or cold. How is this diagnosed? This condition may be diagnosed based on:  Your symptoms and medical history.  A physical exam. There is no single test to diagnose an essential tremor. However, your health care provider may order tests to rule out other causes of your condition. These may include:  Blood and urine tests.  Imaging studies of your brain, such as CT scan and MRI.  A test that measures involuntary muscle movement (electromyogram). How is this treated? Treatment for essential tremor depends on the severity of the condition.  Some tremors may go away without treatment.  Mild tremors may not need treatment if they do not affect your day-to-day life.  Severe tremors may need to be treated using one or more of the following  options: ? Medicines. ? Lifestyle changes. ? Occupational or physical therapy. Follow these instructions at home: Lifestyle   Do not use any products that contain nicotine or tobacco, such as cigarettes and e-cigarettes. If you need help quitting, ask your health care provider.  Limit your caffeine intake as told by your health care provider.  Try to get 8 hours of sleep each night.  Find ways to manage your stress that fits your lifestyle and personality. Consider trying meditation or yoga.  Try to anticipate stressful situations and allow extra time to manage them.  If you are struggling emotionally with the effects of your tremor, consider working with a mental health provider. General instructions  Take over-the-counter and prescription medicines only as told by your health care provider.  Avoid extreme heat and extreme cold.  Keep all follow-up visits as told by your health care provider. This is important. Visits may include physical therapy visits. Contact a health care provider if:  You experience any changes in the location or intensity of your tremors.  You start having a tremor after starting a new medicine.  You have tremor with other symptoms, such as: ? Numbness. ? Tingling. ? Pain. ? Weakness.  Your tremor gets worse.  Your tremor interferes with your daily life.  You feel down, blue, or sad for at least 2 weeks in a row.  Worrying about your tremor and what other people think about you interferes with your everyday  life functions, including relationships, work, or school. Summary  Essential tremor is a tremor without a known cause. Usually, it occurs when you are trying to perform an action.  The cause of this condition is not known.  The main sign of a tremor is a rhythmic shaking of certain parts of your body that is uncontrolled and unintentional.  Treatment for essential tremor depends on the severity of the condition. This information is not  intended to replace advice given to you by your health care provider. Make sure you discuss any questions you have with your health care provider. Document Released: 08/10/2014 Document Revised: 07/30/2017 Document Reviewed: 07/30/2017 Elsevier Patient Education  [employer].  Tremor A tremor is trembling or shaking that you cannot control. Most tremors affect the hands or arms. Tremors can also affect the head, vocal cords, face, and other parts of the body. There are many types of tremors. Common types include:  Essential tremor. These usually occur in people older than 40. It may run in families and can happen in otherwise healthy people.  Resting tremor. These occur when the muscles are at rest, such as when your hands are resting in your lap. People with Parkinson's disease often have resting tremors.  Postural tremor. These occur when you try to hold a pose, such as keeping your hands outstretched.  Kinetic tremor. These occur during purposeful movement, such as trying to touch a finger to your nose.  Task-specific tremor. These may occur when you perform certain tasks such as writing, speaking, or standing.  Psychogenic tremor. These dramatically lessen or disappear when you are distracted. They can happen in people of all ages. Some types of tremors have no known cause. Tremors can also be a symptom of nervous system problems (neurological disorders) that may occur with aging. Some tremors go away with treatment, while others do not. Follow these instructions at home: Lifestyle      Limit alcohol intake to no more than 1 drink a day for nonpregnant women and 2 drinks a day for men. One drink equals 12 oz of beer, 5 oz of wine, or 1 oz of hard liquor.  Do not use any products that contain nicotine or tobacco, such as cigarettes and e-cigarettes. If you need help quitting, ask your health care provider.  Avoid extreme heat and extreme cold.  Limit your caffeine intake, as  told by your health care provider.  Try to get 8 hours of sleep each night.  Find ways to manage your stress, such as meditation or yoga. General instructions  Take over-the-counter and prescription medicines only as told by your health care provider.  Keep all follow-up visits as told by your health care provider. This is important. Contact a health care provider if you:  Develop a tremor after starting a new medicine.  Have a tremor along with other symptoms such as: ? Numbness. ? Tingling. ? Pain. ? Weakness.  Notice that your tremor gets worse.  Notice that your tremor interferes with your day-to-day life. Summary  A tremor is trembling or shaking that you cannot control.  Most tremors affect the hands or arms.  Some types of tremors have no known cause. Others may be a symptom of nervous system problems (neurological disorders).  Make sure you discuss any tremors you have with your health care provider. This information is not intended to replace advice given to you by your health care provider. Make sure you discuss any questions you have with your  health care provider. Document Released: 07/10/2002 Document Revised: 07/02/2017 Document Reviewed: 05/20/2017 Elsevier Patient Education  2020 [employer].

## 2019-05-29 ENCOUNTER — Ambulatory Visit
Admission: RE | Admit: 2019-05-29 | Discharge: 2019-05-29 | Disposition: A | Discharge status: Home / Self Care | Payer: Medicare Other | Source: Ambulatory Visit | Attending: Internal Medicine | Admitting: Internal Medicine

## 2019-05-29 ENCOUNTER — Other Ambulatory Visit: Payer: Self-pay

## 2019-05-29 DIAGNOSIS — R102 Pelvic and perineal pain: Secondary | ICD-10-CM | POA: Insufficient documentation

## 2019-05-29 DIAGNOSIS — G8929 Other chronic pain: Secondary | ICD-10-CM | POA: Diagnosis present

## 2019-05-29 MED ORDER — IOHEXOL 300 MG/ML  SOLN
100.0000 mL | Freq: Once | INTRAMUSCULAR | Status: AC | PRN
  Administered 2019-05-29: 100 mL via INTRAVENOUS

## 2019-05-30 ENCOUNTER — Encounter: Payer: Self-pay | Admitting: Internal Medicine

## 2019-06-01 ENCOUNTER — Encounter: Payer: Self-pay | Admitting: Internal Medicine

## 2019-06-02 ENCOUNTER — Other Ambulatory Visit: Payer: Self-pay | Admitting: Internal Medicine

## 2019-06-02 ENCOUNTER — Encounter: Payer: Self-pay | Admitting: Internal Medicine

## 2019-06-02 DIAGNOSIS — K862 Cyst of pancreas: Secondary | ICD-10-CM

## 2019-06-02 DIAGNOSIS — D490 Neoplasm of unspecified behavior of digestive system: Secondary | ICD-10-CM

## 2019-06-02 DIAGNOSIS — K838 Other specified diseases of biliary tract: Secondary | ICD-10-CM

## 2019-06-02 NOTE — Telephone Encounter (Signed)
Patient is very upset and states she would like to speak with Fransisco Beau only and will be waiting by her phone and would like a call back today. Patient states this is her 2x calling.

## 2019-06-05 ENCOUNTER — Other Ambulatory Visit: Payer: Self-pay | Admitting: Internal Medicine

## 2019-06-05 NOTE — Telephone Encounter (Signed)
Prescription refill request for Eliquis received.  Last office visit: Monica Whitaker (05-16-2019) Scr: 0.59 (04-14-2019) Age: 73 yo Weight: 77.8 kg   Prescription refill sent.

## 2019-06-06 ENCOUNTER — Encounter: Payer: Self-pay | Admitting: Internal Medicine

## 2019-06-13 ENCOUNTER — Other Ambulatory Visit: Payer: Self-pay | Admitting: Psychiatry

## 2019-06-13 NOTE — Telephone Encounter (Signed)
It appears that her primary care doctor has increased the dose from duloxetine 90 mg to duloxetine 120 mg daily.  Therefore this 30 mg capsule should be denied.

## 2019-06-13 NOTE — Telephone Encounter (Signed)
Looks like there has been changes with her primary doctor? Unclear on her dose

## 2019-06-16 ENCOUNTER — Ambulatory Visit (INDEPENDENT_AMBULATORY_CARE_PROVIDER_SITE_OTHER): Payer: Medicare Other | Admitting: Internal Medicine

## 2019-06-16 ENCOUNTER — Other Ambulatory Visit: Payer: Self-pay

## 2019-06-16 VITALS — Ht 69.5 in | Wt 170.0 lb

## 2019-06-16 DIAGNOSIS — R251 Tremor, unspecified: Secondary | ICD-10-CM

## 2019-06-16 DIAGNOSIS — R935 Abnormal findings on diagnostic imaging of other abdominal regions, including retroperitoneum: Secondary | ICD-10-CM | POA: Diagnosis not present

## 2019-06-16 DIAGNOSIS — K862 Cyst of pancreas: Secondary | ICD-10-CM

## 2019-06-16 NOTE — [facility] Notes (Signed)
Telephone Note  I connected with Monica Whitaker  on 06/16/19 at  8:30 AM EST by a telephone and verified that I am speaking with the correct person using two identifiers.  Location patient: home Location provider:work or home office Persons participating in the virtual visit: patient, provider  I discussed the limitations of evaluation and management by telemedicine and the availability of in person appointments. The patient expressed understanding and agreed to proceed.   HPI: 1. 05/29/19 CT abpelvis abnormal pending MRI/MRCP 06/27/19  IMPRESSION: 1. No acute intra-abdominal or pelvic pathology. 2. Sigmoid diverticulosis. No bowel obstruction or active inflammation. Normal appendix. 3. Multiple low attenuating/cystic lesions throughout the pancreas as described. Further evaluation with pancreatic protocol MRI is recommended. 4. Aortic Atherosclerosis (ICD10-I70.0).  Pancreas: Multiple low attenuating/cystic lesions noted throughout the pancreas. The largest lesion measures approximately 1.6 x 2.1 cm in the distal body (series 4, image 62). There is mild atrophy of the tail of the pancreas with probable mild dilatation of the main pancreatic duct. Evaluation of these lesions is limited on this CT but may represent side branch IPMN or other mucinous lesions. Further evaluation with pancreatic protocol MRI is recommended. A 1.8 x 3.7 cm low attenuating/cystic lesion or cluster of adjacent small cystic lesions noted in the proximal body of the pancreas (series 4, image 54 and series 2, image 20). No active inflammatory Changes.  2. Tremor on gabapentin 100 mg bid will increase to 200 mg bid per neurology D.r Melrose Nakayama saw 06/06/2019 and will see again in 3 months helping with tremor at night   ROS: See pertinent positives and negatives per HPI.  Past Medical History:  Diagnosis Date  . Anxiety    controlled with meds  . Anxiety   . Arrhythmia   . Atrial fibrillation ([name])    paroxysmal, failed medical therapy with flecainide,  NSVT with tikosyn  . Bruises easily   . CVA (cerebral vascular accident) ([place]) 12/2007  . Depression    medically controlled   . Glaucoma    also with macular holes AE Dr. Thomasene Ripple  . Low blood pressure    no falls, but if gets up to fast  . RLS (restless legs syndrome)   . Stroke [facility]) 7 years ago   Lasting effects on balance, different personality.  . Thyroid disease   . Tremor   . 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 ATRIAL FIBRILLATION ABLATION  10/18/12   PVI by Dr Rayann Heman  . ATRIAL FIBRILLATION ABLATION N/A 10/18/2012   Procedure: ATRIAL FIBRILLATION ABLATION;  Surgeon: Thompson Grayer, MD;  Location[facility] C[facility] LAB;  Service: Cardiovascular;  Laterality: N/A;  . ATRIAL FIBRILLATION ABLATION N/A 04/18/2019   Procedure: ATRIAL FIBRILLATION ABLATION;  Surgeon: Thompson Grayer, MD;  Location: [place] CV LAB;  Service: Cardiovascular;  Laterality: N/A;  . EYE SURGERY     on L/R eye, macular hole   . OPEN REDUCTION INTERNAL FIXATION (ORIF) DISTAL RADIAL FRACTURE Right 11/15/2014   Procedure: OPEN REDUCTION INTERNAL FIXATION (ORIF) RIGHT DISTAL RADIAL FRACTURE WITH ALLOGRAFT BONE GRAFT;  Surgeon: Roseanne Kaufman, MD;  Location: [place];  Service: Orthopedics;  Laterality: Right;  . TEE WITHOUT CARDIOVERSION N/A 10/18/2012   Procedure: TRANSESOPHAGEAL ECHOCARDIOGRAM (TEE);  Surgeon: Lelon Perla, MD;  Location: [facility] ENDOSCOPY;  Service: Cardiovascular;  Laterality: N/A;  Pre-Ablation at 12pm  . TONSILLECTOMY    .  VAGINAL HYSTERECTOMY      Family History  Problem Relation Age of Onset  . Multiple sclerosis Father   . Breast cancer Neg Hx     SOCIAL HX: [place]   Current Outpatient Medications:  .  acetaminophen (TYLENOL) 500 MG tablet, Take 1,000 mg by mouth every 6 (six) hours as needed (pain). Take 2 tablets by  mouth every 6 hours as needed for pain, Disp: , Rfl:  .  amphetamine-dextroamphetamine (ADDERALL XR) 10 MG 24 hr capsule, Take 1 capsule (10 mg total) by mouth daily., Disp: 90 capsule, Rfl: 0 .  benzonatate (TESSALON) 100 MG capsule, Take by mouth as needed for cough., Disp: , Rfl:  .  calcium carbonate (OS-CAL) 600 MG TABS, Take 600 mg by mouth daily with breakfast. , Disp: , Rfl:  .  cholecalciferol (VITAMIN D) 1000 UNITS tablet, Take 2,000 Units by mouth daily. , Disp: , Rfl:  .  CRANBERRY EXTRACT PO, Take by mouth., Disp: , Rfl:  .  denosumab (PROLIA) 60 MG/ML SOLN injection, Inject 60 mg into the skin every 6 (six) months. , Disp: , Rfl:  .  diltiazem (CARDIZEM CD) 240 MG 24 hr capsule, TAKE 1 CAPSULE (240 MG TOTAL) BY MOUTH DAILY. MAY TAKE EXTRA CAPSULE DAILY AS NEEDED FOR AFIB, Disp: 95 capsule, Rfl: 1 .  DULoxetine (CYMBALTA) 60 MG capsule, Take 1 capsule (60 mg total) by mouth 2 (two) times daily. Take with 30 mg duloxetine, Disp: 90 capsule, Rfl: 1 .  ELIQUIS 5 MG TABS tablet, TAKE 1 TABLET BY MOUTH TWICE A DAY, Disp: 180 tablet, Rfl: 1 .  estradiol (ESTRACE VAGINAL) 0.1 MG/GM vaginal cream, Apply 0.5mg  (pea-sized amount)  just inside the vaginal introitus with a finger-tip on Monday, Wednesday and Friday nights., Disp: 30 g, Rfl: 12 .  flecainide (TAMBOCOR) 100 MG tablet, TAKE 1 TABLET BY MOUTH TWICE A DAY, Disp: 180 tablet, Rfl: 2 .  gabapentin (NEURONTIN) 100 MG capsule, TAKE 100 MG TWICE A DAY FOR ONE WEEK, THEN INCREASE TO 200 MG(2 TABLETS) TWICE A DAY AND CONTINUE, Disp: , Rfl:  .  levothyroxine (SYNTHROID) 25 MCG tablet, Take 1 tablet (25 mcg total) by mouth daily. 30 minutes before food, Disp: 90 tablet, Rfl: 3 .  Melatonin ([employer] MELATONIN) 5 MG SUBL, Place under the tongue., Disp: , Rfl:  .  Multiple Vitamin (MULTIVITAMIN) tablet, Take 1 tablet by mouth daily.  , Disp: , Rfl:  .  SALT SUBSTITUTES PO, Take 1 tablet by mouth., Disp: , Rfl:  .  timolol (TIMOPTIC) 0.5 % ophthalmic  solution, Place 1 drop into the left eye daily. , Disp: , Rfl:  .  triamcinolone cream (KENALOG) 0.1 %, Apply 1 application topically 2 (two) times daily. Lower legs prn red dry rash, Disp: 30 g, Rfl: 0 .  vitamin B-12 (CYANOCOBALAMIN) 1000 MCG tablet, Take 1,000 mcg by mouth daily., Disp: , Rfl:  .  vitamin C (ASCORBIC ACID) 500 MG tablet, Take 500 mg by mouth daily., Disp: , Rfl:  .  vitamin E 400 UNIT capsule, Take 400 Units by mouth daily.  , Disp: , Rfl:   EXAM:  VITALS per patient if applicable:  GENERAL: alert, oriented, appears well and in no acute distress  HEENT: atraumatic, conjunttiva clear, no obvious abnormalities on inspection of external nose and ears  NECK: normal movements of the head and neck  LUNGS: on inspection no signs of respiratory distress, breathing rate appears normal, no obvious gross SOB, gasping or  wheezing  CV: no obvious cyanosis  MS: moves all visible extremities without noticeable abnormality  PSYCH/NEURO: pleasant and cooperative, no obvious depression or anxiety, speech and thought processing grossly intact  ASSESSMENT AND PLAN:  Discussed the following assessment and plan:  Pancreatic cyst ? IMPN MRI/MRCP sch 06/27/19 Refer [facility] to f/u on this   Tremor Gabapentin 100 mg bid to increase to 200 mg bid  F/u in 09/2019   HM Flu shot utd  utd prevnar  pna 23 due 04/2020  Tdap utd  shingrix had 2/2 shots  mammo referred due 07/26/19  Colonoscopy had 03/06/14 diverticulosis  02/17/16 osteoporosis reordered another  dexa + osteoporosis next prolia shot due 06/2019  Skin [facility] derm pt to call [facility] had 04/11/2019 ef 60-65%  She is doing exercise PT at [facility] -we discussed possible serious and likely etiologies, options for evaluation and workup, limitations of telemedicine visit vs in person visit, treatment, treatment risks and precautions. Pt prefers to treat via telemedicine empirically rather then risking or undertaking an in  person visit at this moment. Patient agrees to seek prompt in person care if worsening, new symptoms arise, or if is not improving with treatment.   I discussed the assessment and treatment plan with the patient. The patient was provided an opportunity to ask questions and all were answered. The patient agreed with the plan and demonstrated an understanding of the instructions.   The patient was advised to call back or seek an in-person evaluation if the symptoms worsen or if the condition fails to improve as anticipated.  Time spent 15 minutes  Delorise Jackson, MD

## 2019-06-19 ENCOUNTER — Ambulatory Visit: Payer: Medicare Other

## 2019-06-20 ENCOUNTER — Telehealth: Payer: Self-pay | Admitting: Psychiatry

## 2019-06-20 ENCOUNTER — Other Ambulatory Visit: Payer: Self-pay

## 2019-06-20 DIAGNOSIS — F908 Attention-deficit hyperactivity disorder, other type: Secondary | ICD-10-CM

## 2019-06-20 MED ORDER — AMPHETAMINE-DEXTROAMPHET ER 10 MG PO CP24: 10.0000 mg | ORAL_CAPSULE | Freq: Every day | ORAL | 0 refills | Status: DC

## 2019-06-20 NOTE — Telephone Encounter (Signed)
Last refill in August for 90 day supply, pended for approval at [facility]

## 2019-06-20 NOTE — Telephone Encounter (Signed)
Pt called to request refill for Adderall 10 mg @ [facility] on file

## 2019-06-26 ENCOUNTER — Ambulatory Visit (INDEPENDENT_AMBULATORY_CARE_PROVIDER_SITE_OTHER): Payer: Medicare Other

## 2019-06-26 ENCOUNTER — Encounter: Payer: Self-pay | Admitting: Internal Medicine

## 2019-06-26 ENCOUNTER — Other Ambulatory Visit (INDEPENDENT_AMBULATORY_CARE_PROVIDER_SITE_OTHER): Payer: Medicare Other

## 2019-06-26 ENCOUNTER — Other Ambulatory Visit: Payer: Self-pay

## 2019-06-26 DIAGNOSIS — G2581 Restless legs syndrome: Secondary | ICD-10-CM

## 2019-06-26 DIAGNOSIS — I4891 Unspecified atrial fibrillation: Secondary | ICD-10-CM | POA: Diagnosis not present

## 2019-06-26 DIAGNOSIS — E039 Hypothyroidism, unspecified: Secondary | ICD-10-CM

## 2019-06-26 DIAGNOSIS — Z13220 Encounter for screening for lipoid disorders: Secondary | ICD-10-CM

## 2019-06-26 DIAGNOSIS — M8000XP Age-related osteoporosis with current pathological fracture, unspecified site, subsequent encounter for fracture with malunion: Secondary | ICD-10-CM | POA: Diagnosis not present

## 2019-06-26 LAB — LIPID PANEL
Cholesterol: 229 mg/dL — ABNORMAL HIGH (ref 0–200)
HDL: 94.9 mg/dL (ref >39.00)
LDL Cholesterol: 122 mg/dL — ABNORMAL HIGH (ref 0–99)
NonHDL: 134.06
Total CHOL/HDL Ratio: 2
Triglycerides: 58 mg/dL (ref 0.0–149.0)
VLDL: 11.6 mg/dL (ref 0.0–40.0)

## 2019-06-26 LAB — HEPATIC FUNCTION PANEL
ALT: 19 U/L (ref 0–35)
AST: 21 U/L (ref 0–37)
Albumin: 3.8 g/dL (ref 3.5–5.2)
Alkaline Phosphatase: 54 U/L (ref 39–117)
Bilirubin, Direct: 0.1 mg/dL (ref 0.0–0.3)
Total Bilirubin: 0.7 mg/dL (ref 0.2–1.2)
Total Protein: 6.5 g/dL (ref 6.0–8.3)

## 2019-06-26 LAB — TSH: TSH: 1.35 u[IU]/mL (ref 0.35–4.50)

## 2019-06-26 MED ORDER — DENOSUMAB 60 MG/ML ~~LOC~~ SOSY
60.0000 mg | PREFILLED_SYRINGE | Freq: Once | SUBCUTANEOUS | Status: AC
  Administered 2019-06-26: 60 mg via SUBCUTANEOUS

## 2019-06-26 NOTE — [facility] Notes (Signed)
Patient presented today for Prolia injection. Administered SQ in right arm.  Patient tolerated well with no signs of distress.

## 2019-06-27 ENCOUNTER — Other Ambulatory Visit: Payer: Self-pay

## 2019-06-27 ENCOUNTER — Encounter

## 2019-06-27 ENCOUNTER — Ambulatory Visit: Payer: Medicare Other | Admitting: Neurology

## 2019-06-27 ENCOUNTER — Ambulatory Visit
Admission: RE | Admit: 2019-06-27 | Discharge: 2019-06-27 | Disposition: A | Discharge status: Home / Self Care | Payer: Medicare Other | Source: Ambulatory Visit | Attending: Internal Medicine | Admitting: Internal Medicine

## 2019-06-27 ENCOUNTER — Other Ambulatory Visit: Payer: Self-pay | Admitting: Internal Medicine

## 2019-06-27 DIAGNOSIS — K862 Cyst of pancreas: Secondary | ICD-10-CM | POA: Insufficient documentation

## 2019-06-27 DIAGNOSIS — D490 Neoplasm of unspecified behavior of digestive system: Secondary | ICD-10-CM | POA: Insufficient documentation

## 2019-06-27 DIAGNOSIS — K838 Other specified diseases of biliary tract: Secondary | ICD-10-CM | POA: Insufficient documentation

## 2019-06-27 LAB — IRON,TIBC AND FERRITIN PANEL
%SAT: 45 % (calc) (ref 16–45)
Ferritin: 82 ng/mL (ref 16–288)
Iron: 118 ug/dL (ref 45–160)
TIBC: 264 mcg/dL (calc) (ref 250–450)

## 2019-06-27 LAB — POCT I-STAT CREATININE: Creatinine, Ser: 0.8 mg/dL (ref 0.44–1.00)

## 2019-06-27 MED ORDER — GADOBUTROL 1 MMOL/ML IV SOLN
7.0000 mL | Freq: Once | INTRAVENOUS | Status: AC | PRN
  Administered 2019-06-27: 7 mL via INTRAVENOUS

## 2019-06-30 ENCOUNTER — Encounter: Payer: Self-pay | Admitting: Internal Medicine

## 2019-07-03 ENCOUNTER — Other Ambulatory Visit: Payer: Self-pay | Admitting: Internal Medicine

## 2019-07-03 DIAGNOSIS — I639 Cerebral infarction, unspecified: Secondary | ICD-10-CM

## 2019-07-03 DIAGNOSIS — E785 Hyperlipidemia, unspecified: Secondary | ICD-10-CM

## 2019-07-03 MED ORDER — PRAVASTATIN SODIUM 20 MG PO TABS: 20.0000 mg | ORAL_TABLET | Freq: Every day | ORAL | 3 refills | Status: DC

## 2019-07-03 NOTE — Patient Instructions (Signed)
Budget-Friendly Healthy Eating There are many ways to save money at the grocery store and continue to eat healthy. You can be successful if you:  Plan meals according to your budget.  Make a grocery list and only purchase food according to your grocery list.  Prepare food yourself. What are tips for following this plan?  Reading food labels  Compare food labels between brand name foods and the store brand. Often the nutritional value is the same, but the store brand is lower cost.  Look for products that do not have added sugar, fat, or salt (sodium). These often cost the same but are healthier for you. Products may be labeled as: ? Sugar-free. ? Nonfat. ? Low-fat. ? Sodium-free. ? Low-sodium.  Look for lean ground beef labeled as at least 92% lean and 8% fat. Shopping  Buy only the items on your grocery list and go only to the areas of the store that have the items on your list.  Use coupons only for foods and brands you normally buy. Avoid buying items you wouldn't normally buy simply because they are on sale.  Check online and in newspapers for weekly deals.  Buy healthy items from the bulk bins when available, such as herbs, spices, flour, pasta, nuts, and dried fruit.  Buy fruits and vegetables that are in season. Prices are usually lower on in-season produce.  Look at the unit price on the price tag. Use it to compare different brands and sizes to find out which item is the best deal.  Choose healthy items that are often low-cost, such as carrots, potatoes, apples, bananas, and oranges. Dried or canned beans are a low-cost protein source.  Buy in bulk and freeze extra food. Items you can buy in bulk include meats, fish, poultry, frozen fruits, and frozen vegetables.  Avoid buying "ready-to-eat" foods, such as pre-cut fruits and vegetables and pre-made salads.  If possible, shop around to discover where you can find the best prices. Consider other retailers such as  dollar stores, larger [employer], local fruit and vegetable stands, and farmers markets.  Do not shop when you are hungry. If you shop while hungry, it may be hard to stick to your list and budget.  Resist impulse buying. Use your grocery list as your official plan for the week.  Buy a variety of vegetables and fruits by purchasing fresh, frozen, and canned items.  Look at the top and bottom shelves for deals. Foods at eye level (eye level of an adult or child) are usually more expensive.  Be efficient with your time when shopping. The more time you spend at the store, the more money you are likely to spend.  To save money when choosing more expensive foods like meats and dairy: ? Choose cheaper cuts of meat, such as bone-in chicken thighs and drumsticks instead of skinless and boneless chicken. When you are ready to prepare the chicken, you can remove the skin yourself to make it healthier. ? Choose lean meats like chicken or [place] instead of beef. ? Choose canned seafood, such as tuna, salmon, or sardines. ? Buy eggs as a low-cost source of protein. ? Buy dried beans and peas, such as lentils, split peas, or kidney beans instead of meats. Dried beans and peas are a good alternative source of protein. ? Buy the larger tubs of yogurt instead of individual-sized containers.  Choose water instead of sodas and other sweetened beverages.  Avoid buying chips, cookies, and other "junk food." These  items are usually expensive and not healthy. Cooking  Make extra food and freeze the extras in meal-sized containers or in individual portions for fast meals and snacks.  Pre-cook on days when you have extra time to prepare meals in advance. You can keep these meals in the fridge or freezer and reheat for a quick meal.  When you come home from the grocery store, wash, peel, and cut fruits and vegetables so they are ready to use and eat. This will help reduce food waste. Meal planning  Do  not eat out or get fast food. Prepare food at home.  Make a grocery list and make sure to bring it with you to the store. If you have a smart phone, you could use your phone to create your shopping list.  Plan meals and snacks according to a grocery list and budget you create.  Use leftovers in your meal plan for the week.  Look for recipes where you can cook once and make enough food for two meals.  Include budget-friendly meals like stews, casseroles, and stir-fry dishes.  Try some meatless meals or try "no cook" meals like salads.  Make sure that half your plate is filled with fruits or vegetables. Choose from fresh, frozen, or canned fruits and vegetables. If eating canned, remember to rinse them before eating. This will remove any excess salt added for packaging. Summary  Eating healthy on a budget is possible if you plan your meals according to your budget, purchase according to your budget and grocery list, and prepare food yourself.  Tips for buying more food on a limited budget include buying generic brands, using coupons only for foods you normally buy, and buying healthy items from the bulk bins when available.  Tips for buying cheaper food to replace expensive food include choosing cheaper, lean cuts of meat, and buying dried beans and peas. This information is not intended to replace advice given to you by your health care provider. Make sure you discuss any questions you have with your health care provider. Document Released: 03/23/2014 Document Revised: 07/21/2017 Document Reviewed: 07/21/2017 Elsevier Patient Education  2020 [employer] Following a healthy eating pattern may help you to achieve and maintain a healthy body weight, reduce the risk of chronic disease, and live a long and productive life. It is important to follow a healthy eating pattern at an appropriate calorie level for your body. Your nutritional needs should be met primarily through  food by choosing a variety of nutrient-rich foods. What are tips for following this plan? Reading food labels  Read labels and choose the following: ? Reduced or low sodium. ? Juices with 100% fruit juice. ? Foods with low saturated fats and high polyunsaturated and monounsaturated fats. ? Foods with whole grains, such as whole wheat, cracked wheat, brown rice, and wild rice. ? Whole grains that are fortified with folic acid. This is recommended for women who are pregnant or who want to become pregnant.  Read labels and avoid the following: ? Foods with a lot of added sugars. These include foods that contain brown sugar, corn sweetener, corn syrup, dextrose, fructose, glucose, high-fructose corn syrup, honey, invert sugar, lactose, malt syrup, maltose, molasses, raw sugar, sucrose, trehalose, or turbinado sugar.  Do not eat more than the following amounts of added sugar per day:  6 teaspoons (25 g) for women.  9 teaspoons (38 g) for men. ? Foods that contain processed or refined starches and grains. ? Refined  grain products, such as white flour, degermed cornmeal, white bread, and white rice. Shopping  Choose nutrient-rich snacks, such as vegetables, whole fruits, and nuts. Avoid high-calorie and high-sugar snacks, such as potato chips, fruit snacks, and candy.  Use oil-based dressings and spreads on foods instead of solid fats such as butter, stick margarine, or cream cheese.  Limit pre-made sauces, mixes, and "instant" products such as flavored rice, instant noodles, and ready-made pasta.  Try more plant-protein sources, such as tofu, tempeh, black beans, edamame, lentils, nuts, and seeds.  Explore eating plans such as the Mediterranean diet or vegetarian diet. Cooking  Use oil to saut or stir-fry foods instead of solid fats such as butter, stick margarine, or lard.  Try baking, boiling, grilling, or broiling instead of frying.  Remove the fatty part of meats before cooking.   Steam vegetables in water or broth. Meal planning   At meals, imagine dividing your plate into fourths: ? One-half of your plate is fruits and vegetables. ? One-fourth of your plate is whole grains. ? One-fourth of your plate is protein, especially lean meats, poultry, eggs, tofu, beans, or nuts.  Include low-fat dairy as part of your daily diet. Lifestyle  Choose healthy options in all settings, including home, work, school, restaurants, or stores.  Prepare your food safely: ? Wash your hands after handling raw meats. ? Keep food preparation surfaces clean by regularly washing with hot, soapy water. ? Keep raw meats separate from ready-to-eat foods, such as fruits and vegetables. ? Cook seafood, meat, poultry, and eggs to the recommended internal temperature. ? Store foods at safe temperatures. In general:  Keep cold foods at 104F (4.4C) or below.  Keep hot foods at 1104F (60C) or above.  Keep your freezer at [facility] (-17.8C) or below.  Foods are no longer safe to eat when they have been between the temperatures of 40-1104F (4.4-60C) for more than 2 hours. What foods should I eat? Fruits Aim to eat 2 cup-equivalents of fresh, canned (in natural juice), or frozen fruits each day. Examples of 1 cup-equivalent of fruit include 1 small apple, 8 large strawberries, 1 cup canned fruit,  cup dried fruit, or 1 cup 100% juice. Vegetables Aim to eat 2-3 cup-equivalents of fresh and frozen vegetables each day, including different varieties and colors. Examples of 1 cup-equivalent of vegetables include 2 medium carrots, 2 cups raw, leafy greens, 1 cup chopped vegetable (raw or cooked), or 1 medium baked potato. Grains Aim to eat 6 ounce-equivalents of whole grains each day. Examples of 1 ounce-equivalent of grains include 1 slice of bread, 1 cup ready-to-eat cereal, 3 cups popcorn, or  cup cooked rice, pasta, or cereal. Meats and other proteins Aim to eat 5-6 ounce-equivalents of  protein each day. Examples of 1 ounce-equivalent of protein include 1 egg, 1/2 cup nuts or seeds, or 1 tablespoon (16 g) peanut butter. A cut of meat or fish that is the size of a deck of cards is about 3-4 ounce-equivalents.  Of the protein you eat each week, try to have at least 8 ounces come from seafood. This includes salmon, trout, herring, and anchovies. Dairy Aim to eat 3 cup-equivalents of fat-free or low-fat dairy each day. Examples of 1 cup-equivalent of dairy include 1 cup (240 mL) milk, 8 ounces (250 g) yogurt, 1 ounces (44 g) natural cheese, or 1 cup (240 mL) fortified soy milk. Fats and oils  Aim for about 5 teaspoons (21 g) per day. Choose monounsaturated fats, such as  canola and olive oils, avocados, peanut butter, and most nuts, or polyunsaturated fats, such as sunflower, corn, and soybean oils, walnuts, pine nuts, sesame seeds, sunflower seeds, and flaxseed. Beverages  Aim for six 8-oz glasses of water per day. Limit coffee to three to five 8-oz cups per day.  Limit caffeinated beverages that have added calories, such as soda and energy drinks.  Limit alcohol intake to no more than 1 drink a day for nonpregnant women and 2 drinks a day for men. One drink equals 12 oz of beer (355 mL), 5 oz of wine (148 mL), or 1 oz of hard liquor (44 mL). Seasoning and other foods  Avoid adding excess amounts of salt to your foods. Try flavoring foods with herbs and spices instead of salt.  Avoid adding sugar to foods.  Try using oil-based dressings, sauces, and spreads instead of solid fats. This information is based on general [place] nutrition guidelines. For more information, visit [employer]. Exact amounts may vary based on your nutrition needs. Summary  A healthy eating plan may help you to maintain a healthy weight, reduce the risk of chronic diseases, and stay active throughout your life.  Plan your meals. Make sure you eat the right portions of a variety of nutrient-rich  foods.  Try baking, boiling, grilling, or broiling instead of frying.  Choose healthy options in all settings, including home, work, school, restaurants, or stores. This information is not intended to replace advice given to you by your health care provider. Make sure you discuss any questions you have with your health care provider. Document Released: 11/01/2017 Document Revised: 11/01/2017 Document Reviewed: 11/01/2017 Elsevier Patient Education  [place].  High Cholesterol  High cholesterol is a condition in which the blood has high levels of a white, waxy, fat-like substance (cholesterol). The human body needs small amounts of cholesterol. The liver makes all the cholesterol that the body needs. Extra (excess) cholesterol comes from the food that we eat. Cholesterol is carried from the liver by the blood through the blood vessels. If you have high cholesterol, deposits (plaques) may build up on the walls of your blood vessels (arteries). Plaques make the arteries narrower and stiffer. Cholesterol plaques increase your risk for heart attack and stroke. Work with your health care provider to keep your cholesterol levels in a healthy range. What increases the risk? This condition is more likely to develop in people who:  Eat foods that are high in animal fat (saturated fat) or cholesterol.  Are overweight.  Are not getting enough exercise.  Have a family history of high cholesterol. What are the signs or symptoms? There are no symptoms of this condition. How is this diagnosed? This condition may be diagnosed from the results of a blood test.  If you are older than age 54, your health care provider may check your cholesterol every 4-6 years.  You may be checked more often if you already have high cholesterol or other risk factors for heart disease. The blood test for cholesterol measures:  "Bad" cholesterol (LDL cholesterol). This is the main type of cholesterol that causes  heart disease. The desired level for LDL is less than 100.  "Good" cholesterol (HDL cholesterol). This type helps to protect against heart disease by cleaning the arteries and carrying the LDL away. The desired level for HDL is 60 or higher.  Triglycerides. These are fats that the body can store or burn for energy. The desired number for triglycerides is lower than 150.  Total cholesterol. This is a measure of the total amount of cholesterol in your blood, including LDL cholesterol, HDL cholesterol, and triglycerides. A healthy number is less than 200. How is this treated? This condition is treated with diet changes, lifestyle changes, and medicines. Diet changes  This may include eating more whole grains, fruits, vegetables, nuts, and fish.  This may also include cutting back on red meat and foods that have a lot of added sugar. Lifestyle changes  Changes may include getting at least 40 minutes of aerobic exercise 3 times a week. Aerobic exercises include walking, biking, and swimming. Aerobic exercise along with a healthy diet can help you maintain a healthy weight.  Changes may also include quitting smoking. Medicines  Medicines are usually given if diet and lifestyle changes have failed to reduce your cholesterol to healthy levels.  Your health care provider may prescribe a statin medicine. Statin medicines have been shown to reduce cholesterol, which can reduce the risk of heart disease. Follow these instructions at home: Eating and drinking If told by your health care provider:  Eat chicken (without skin), fish, veal, shellfish, ground [place] breast, and round or loin cuts of red meat.  Do not eat fried foods or fatty meats, such as hot dogs and salami.  Eat plenty of fruits, such as apples.  Eat plenty of vegetables, such as broccoli, potatoes, and carrots.  Eat beans, peas, and lentils.  Eat grains such as barley, rice, couscous, and bulgur wheat.  Eat pasta without  cream sauces.  Use skim or nonfat milk, and eat low-fat or nonfat yogurt and cheeses.  Do not eat or drink whole milk, cream, ice cream, egg yolks, or hard cheeses.  Do not eat stick margarine or tub margarines that contain trans fats (also called partially hydrogenated oils).  Do not eat saturated tropical oils, such as coconut oil and palm oil.  Do not eat cakes, cookies, crackers, or other baked goods that contain trans fats.  General instructions  Exercise as directed by your health care provider. Increase your activity level with activities such as gardening, walking, and taking the stairs.  Take over-the-counter and prescription medicines only as told by your health care provider.  Do not use any products that contain nicotine or tobacco, such as cigarettes and e-cigarettes. If you need help quitting, ask your health care provider.  Keep all follow-up visits as told by your health care provider. This is important. Contact a health care provider if:  You are struggling to maintain a healthy diet or weight.  You need help to start on an exercise program.  You need help to stop smoking. Get help right away if:  You have chest pain.  You have trouble breathing. This information is not intended to replace advice given to you by your health care provider. Make sure you discuss any questions you have with your health care provider. Document Released: 07/20/2005 Document Revised: 07/23/2017 Document Reviewed: 01/18/2016 Elsevier Patient Education  [place].  Cholesterol Content in Foods Cholesterol is a waxy, fat-like substance that helps to carry fat in the blood. The body needs cholesterol in small amounts, but too much cholesterol can cause damage to the arteries and heart. Most people should eat less than 200 milligrams (mg) of cholesterol a day. Foods with cholesterol  Cholesterol is found in animal-based foods, such as meat, seafood, and dairy. Generally,  low-fat dairy and lean meats have less cholesterol than full-fat dairy and fatty meats. The milligrams of  cholesterol per serving (mg per serving) of common cholesterol-containing foods are listed below. Meat and other proteins  Egg - one large whole egg has 186 mg.  Veal shank - 4 oz has 141 mg.  Lean ground [place] (93% lean) - 4 oz has 118 mg.  Fat-trimmed lamb loin - 4 oz has 106 mg.  Lean ground beef (90% lean) - 4 oz has 100 mg.  Lobster - 3.5 oz has 90 mg.  Pork loin chops - 4 oz has 86 mg.  Canned salmon - 3.5 oz has 83 mg.  Fat-trimmed beef top loin - 4 oz has 78 mg.  Frankfurter - 1 frank (3.5 oz) has 77 mg.  Crab - 3.5 oz has 71 mg.  Roasted chicken without skin, white meat - 4 oz has 66 mg.  Light bologna - 2 oz has 45 mg.  Deli-cut [place] - 2 oz has 31 mg.  Canned tuna - 3.5 oz has 31 mg.  Bacon - 1 oz has 29 mg.  Oysters and mussels (raw) - 3.5 oz has 25 mg.  Mackerel - 1 oz has 22 mg.  Trout - 1 oz has 20 mg.  Pork sausage - 1 link (1 oz) has 17 mg.  Salmon - 1 oz has 16 mg.  Tilapia - 1 oz has 14 mg. Dairy  Soft-serve ice cream -  cup (4 oz) has 103 mg.  Whole-milk yogurt - 1 cup (8 oz) has 29 mg.  Cheddar cheese - 1 oz has 28 mg.  American cheese - 1 oz has 28 mg.  Whole milk - 1 cup (8 oz) has 23 mg.  2% milk - 1 cup (8 oz) has 18 mg.  Cream cheese - 1 tablespoon (Tbsp) has 15 mg.  Cottage cheese -  cup (4 oz) has 14 mg.  Low-fat (1%) milk - 1 cup (8 oz) has 10 mg.  Sour cream - 1 Tbsp has 8.5 mg.  Low-fat yogurt - 1 cup (8 oz) has 8 mg.  Nonfat Greek yogurt - 1 cup (8 oz) has 7 mg.  Half-and-half cream - 1 Tbsp has 5 mg. Fats and oils  Cod liver oil - 1 tablespoon (Tbsp) has 82 mg.  Butter - 1 Tbsp has 15 mg.  Lard - 1 Tbsp has 14 mg.  Bacon grease - 1 Tbsp has 14 mg.  Mayonnaise - 1 Tbsp has 5-10 mg.  Margarine - 1 Tbsp has 3-10 mg. Exact amounts of cholesterol in these foods may vary depending on specific  ingredients and brands. Foods without cholesterol Most plant-based foods do not have cholesterol unless you combine them with a food that has cholesterol. Foods without cholesterol include:  Grains and cereals.  Vegetables.  Fruits.  Vegetable oils, such as olive, canola, and sunflower oil.  Legumes, such as peas, beans, and lentils.  Nuts and seeds.  Egg whites. Summary  The body needs cholesterol in small amounts, but too much cholesterol can cause damage to the arteries and heart.  Most people should eat less than 200 milligrams (mg) of cholesterol a day. This information is not intended to replace advice given to you by your health care provider. Make sure you discuss any questions you have with your health care provider. Document Released: 03/16/2017 Document Revised: 07/02/2017 Document Reviewed: 03/16/2017 Elsevier Patient Education  2020 [employer].

## 2019-07-13 ENCOUNTER — Ambulatory Visit (INDEPENDENT_AMBULATORY_CARE_PROVIDER_SITE_OTHER): Payer: Medicare Other | Admitting: Internal Medicine

## 2019-07-13 ENCOUNTER — Encounter: Payer: Self-pay | Admitting: Internal Medicine

## 2019-07-13 ENCOUNTER — Other Ambulatory Visit: Payer: Self-pay

## 2019-07-13 VITALS — BP 102/64 | HR 64 | Ht 69.5 in | Wt 176.0 lb

## 2019-07-13 DIAGNOSIS — I480 Paroxysmal atrial fibrillation: Secondary | ICD-10-CM | POA: Diagnosis not present

## 2019-07-13 HISTORY — PX: OTHER SURGICAL HISTORY: SHX169

## 2019-07-13 NOTE — Patient Instructions (Addendum)
Medication Instructions:  Your physician recommends that you continue on your current medications as directed. Please refer to the Current Medication list given to you today.  Labwork: None ordered.  Testing/Procedures: None ordered.  Follow-Up:  You will have a virtual visit with device clinic for a wound check.  You will get a call today to get this scheduled.  Your physician wants you to follow-up in: 3 months with Dr. Rayann Heman.    October 11, 2019 at 10:45 am MyChart visit with Dr. Rayann Heman.    Any Other Special Instructions Will Be Listed Below (If Applicable).  If you need a refill on your cardiac medications before your next appointment, please call your pharmacy.    Implantable Loop Recorder Placement, Care After This sheet gives you information about how to care for yourself after your procedure. Your health care provider may also give you more specific instructions. If you have problems or questions, contact your health care provider. What can I expect after the procedure? After the procedure, it is common to have:  Soreness or discomfort near the incision.  Some swelling or bruising near the incision. Follow these instructions at home: Incision care   Follow instructions from your health care provider about how to take care of your incision. Make sure you: ? Leave your outer dressing on for 48 hours.  After 48 hours you can remove that and shower. ? Leave adhesive strips in place. These skin closures may need to stay in place for 2 weeks or longer. If adhesive strip edges start to loosen and curl up, you may trim the loose edges. Do not remove adhesive strips completely unless your health care provider tells you to do that.  Check your incision area every day for signs of infection. Check for: ? Redness, swelling, or pain. ? Fluid or blood. ? Warmth. ? Pus or a bad smell. Do not take baths, swim, or use a hot tub until your incision is completely  healed.  Activity  Return to your normal activities.  General instructions  Follow instructions from your health care provider about how to manage your implantable loop recorder and transmit the information. Learn how to activate a recording if this is necessary for your type of device.  Do not go through a metal detection gate, and do not let someone hold a metal detector over your chest. Show your ID card.  Do not have an MRI unless you check with your health care provider first.  Take over-the-counter and prescription medicines only as told by your health care provider.  Keep all follow-up visits as told by your health care provider. This is important. Contact a health care provider if:  You have redness, swelling, or pain around your incision.  You have a fever.  You have pain that is not relieved by your pain medicine.  You have triggered your device because of fainting (syncope) or because of a heartbeat that feels like it is racing, slow, fluttering, or skipping (palpitations). Get help right away if you have:  Chest pain.  Difficulty breathing. Summary  After the procedure, it is common to have soreness or discomfort near the incision.  Change your dressing as told by your health care provider.  Follow instructions from your health care provider about how to manage your implantable loop recorder and transmit the information.  Keep all follow-up visits as told by your health care provider. This is important. This information is not intended to replace advice given to you by  your health care provider. Make sure you discuss any questions you have with your health care provider. Document Released: 07/01/2015 Document Revised: 09/04/2017 Document Reviewed: 09/04/2017 Elsevier Patient Education  2020 [employer].

## 2019-07-13 NOTE — Progress Notes (Signed)
PCP: McLean-Scocuzza, Nino Glow, MD    Primary EP: Dr Rayann Heman  Monica Whitaker is a 73 y.o. female who presents today for routine electrophysiology followup.  Since her last ablation, the patient reports doing very well.  She is pleased with results of her procedure.  Denies procedure related complications.  Today, she denies symptoms of palpitations, chest pain, shortness of breath,  lower extremity edema, dizziness, presyncope, or syncope.  The patient is otherwise without complaint today.   Past Medical History:  Diagnosis Date   Anxiety    controlled with meds   Anxiety    Arrhythmia    Atrial fibrillation ([name])    paroxysmal, failed medical therapy with flecainide,  NSVT with tikosyn   Bruises easily    CVA (cerebral vascular accident) ([place]) 12/2007   Depression    medically controlled    Glaucoma    also with macular holes AE Dr. Thomasene Ripple   Low blood pressure    no falls, but if gets up to fast   RLS (restless legs syndrome)    Stroke ([place]) 7 years ago   Lasting effects on balance, different personality.   Thyroid disease    Tremor    Ventricular tachycardia ([place])    pt told at [facility] that she had RVOT VT   Past Surgical History:  Procedure Laterality Date   ATRIAL FIBRILLATION ABLATION  10/18/12   PVI by Dr Rayann Heman   ATRIAL FIBRILLATION ABLATION N/A 10/18/2012   Procedure: ATRIAL FIBRILLATION ABLATION;  Surgeon: Thompson Grayer, MD;  Location: [facility] CATH LAB;  Service: Cardiovascular;  Laterality: N/A;   ATRIAL FIBRILLATION ABLATION N/A 04/18/2019   Procedure: ATRIAL FIBRILLATION ABLATION;  Surgeon: Thompson Grayer, MD;  Location: [place] CV LAB;  Service: Cardiovascular;  Laterality: N/A;   EYE SURGERY     on L/R eye, macular hole    OPEN REDUCTION INTERNAL FIXATION (ORIF) DISTAL RADIAL FRACTURE Right 11/15/2014   Procedure: OPEN REDUCTION INTERNAL FIXATION (ORIF) RIGHT DISTAL RADIAL FRACTURE WITH ALLOGRAFT BONE GRAFT;  Surgeon: Roseanne Kaufman, MD;   Location: [place];  Service: Orthopedics;  Laterality: Right;   TEE WITHOUT CARDIOVERSION N/A 10/18/2012   Procedure: TRANSESOPHAGEAL ECHOCARDIOGRAM (TEE);  Surgeon: Lelon Perla, MD;  Location: [facility] ENDOSCOPY;  Service: Cardiovascular;  Laterality: N/A;  Pre-Ablation at 12pm   [place]- all systems are reviewed and negatives except as per HPI above  Current Outpatient Medications  Medication Sig Dispense Refill   acetaminophen (TYLENOL) 500 MG tablet Take 1,000 mg by mouth every 6 (six) hours as needed (pain). Take 2 tablets by mouth every 6 hours as needed for pain     amphetamine-dextroamphetamine (ADDERALL XR) 10 MG 24 hr capsule Take 1 capsule (10 mg total) by mouth daily. 90 capsule 0   benzonatate (TESSALON) 100 MG capsule Take by mouth as needed for cough.     calcium carbonate (OS-CAL) 600 MG TABS Take 600 mg by mouth daily with breakfast.      cholecalciferol (VITAMIN D) 1000 UNITS tablet Take 2,000 Units by mouth daily.      CRANBERRY EXTRACT PO Take by mouth.     denosumab (PROLIA) 60 MG/ML SOLN injection Inject 60 mg into the skin every 6 (six) months.      diltiazem (CARDIZEM CD) 240 MG 24 hr capsule TAKE 1 CAPSULE (240 MG TOTAL) BY MOUTH DAILY. MAY TAKE EXTRA CAPSULE DAILY AS NEEDED FOR AFIB 95  capsule 1   DULoxetine (CYMBALTA) 60 MG capsule Take 1 capsule (60 mg total) by mouth 2 (two) times daily. Take with 30 mg duloxetine 90 capsule 1   ELIQUIS 5 MG TABS tablet TAKE 1 TABLET BY MOUTH TWICE A DAY 180 tablet 1   estradiol (ESTRACE VAGINAL) 0.1 MG/GM vaginal cream Apply 0.5mg  (pea-sized amount)  just inside the vaginal introitus with a finger-tip on Monday, Wednesday and Friday nights. 30 g 12   flecainide (TAMBOCOR) 100 MG tablet TAKE 1 TABLET BY MOUTH TWICE A DAY 180 tablet 2   gabapentin (NEURONTIN) 100 MG capsule TAKE 100 MG TWICE A DAY FOR ONE WEEK, THEN INCREASE TO 200 MG(2 TABLETS) TWICE A DAY AND  CONTINUE     levothyroxine (SYNTHROID) 25 MCG tablet Take 1 tablet (25 mcg total) by mouth daily. 30 minutes before food 90 tablet 3   Melatonin ([employer] MELATONIN) 5 MG SUBL Place under the tongue.     Multiple Vitamin (MULTIVITAMIN) tablet Take 1 tablet by mouth daily.       pravastatin (PRAVACHOL) 20 MG tablet Take 1 tablet (20 mg total) by mouth at bedtime. 90 tablet 3   SALT SUBSTITUTES PO Take 1 tablet by mouth.     timolol (TIMOPTIC) 0.5 % ophthalmic solution Place 1 drop into the left eye daily.      triamcinolone cream (KENALOG) 0.1 % Apply 1 application topically 2 (two) times daily. Lower legs prn red dry rash 30 g 0   vitamin B-12 (CYANOCOBALAMIN) 1000 MCG tablet Take 1,000 mcg by mouth daily.     vitamin C (ASCORBIC ACID) 500 MG tablet Take 500 mg by mouth daily.     vitamin E 400 UNIT capsule Take 400 Units by mouth daily.       No current facility-administered medications for this visit.    Physical Exam: Vitals:   07/13/19 1148  BP: 102/64  Pulse: 64  SpO2: 97%  Weight: 176 lb (79.8 kg)  Height: 5' 9.5" (1.765 m)    GEN- The patient is well appearing, alert and oriented x 3 today.   Head- normocephalic, atraumatic Eyes-  Sclera clear, conjunctiva pink Ears- hearing intact Oropharynx- clear Lungs- Clear to ausculation bilaterally, normal work of breathing Heart- Regular rate and rhythm, no murmurs, rubs or gallops, PMI not laterally displaced GI- soft, NT, ND, + BS Extremities- no clubbing, cyanosis, or edema  Wt Readings from Last 3 Encounters:  07/13/19 176 lb (79.8 kg)  06/16/19 170 lb (77.1 kg)  05/25/19 171 lb 9.6 oz (77.8 kg)    EKG tracing ordered today is personally reviewed and shows sinus rhythm 64 bpm, PR 198 msec, QRS 106 msec, Qtc 402 msec  Assessment and Plan:  1. Paroxysmal atrail fibrillation Doing well post ablation She remains on diltiazem and flecainide. I am optimistic that we can stop these medicines.  I would like to monitor  for arrhythmias prior to deciding whether or not to make changes.  She has longstanding palpitations.  She would also like to consider reducing her anticoagulation burden at some point (chads2vasc score is 4). I would therefore advise implantation of an implantable loop recorder for long term arrhythmia monitoring of afib post ablation.  As per guidelines, this can be helpful for afib management and also to guide anticoagulation.  Risks and benefits to ILR were discussed at length with the patient today, including but not limited to risks of bleeding and infection.  Extensive device education was performed.  Remote monitoring was  also discussed at length today.  The patient understands and wishes to proceed.  We will proceed at this time with ILR implantation.   Thompson Grayer MD, [facility] 07/13/2019 12:08 PM      DESCRIPTION OF PROCEDURE:  Informed written consent was obtained.  The patient required no sedation for the procedure today.  The patients left chest was prepped and draped. Mapping over the patient's chest was performed to identify the appropriate ILR site.  This area was found to be the left parasternal region over the 3rd-4th intercostal space.  The skin overlying this region was infiltrated with lidocaine for local analgesia.  A 0.5-cm incision was made at the implant site.  A subcutaneous ILR pocket was fashioned using a combination of sharp and blunt dissection.  A Medtronic Reveal Linq model LNQ 11 implantable loop recorder was then placed into the pocket R waves were very prominent and measured > 0.2 mV. EBL<1 ml.  Steri- Strips and a sterile dressing were then applied.  There were no early apparent complications.     CONCLUSIONS:   1. Successful implantation of a Medtronic Reveal LINQ implantable loop recorder for afib management post ablation and to follow palpitations  2. No early apparent complications.   Thompson Grayer MD, [facility] 07/13/2019 12:08 PM

## 2019-07-14 ENCOUNTER — Telehealth: Payer: Self-pay | Admitting: Internal Medicine

## 2019-07-14 ENCOUNTER — Telehealth: Payer: Self-pay

## 2019-07-14 NOTE — Telephone Encounter (Signed)
New Message:  Patient received a new device and needs assistance with it.

## 2019-07-14 NOTE — Telephone Encounter (Signed)
Pt sent a transmission last night but now the monitor not coming on. I gave her the number to [employer] to get additional help.

## 2019-07-16 ENCOUNTER — Encounter: Payer: Self-pay | Admitting: Internal Medicine

## 2019-07-17 ENCOUNTER — Ambulatory Visit: Payer: Medicare Other | Admitting: Internal Medicine

## 2019-07-18 ENCOUNTER — Other Ambulatory Visit: Payer: Self-pay | Admitting: Psychiatry

## 2019-07-20 ENCOUNTER — Other Ambulatory Visit (HOSPITAL_COMMUNITY): Payer: Self-pay | Admitting: Nurse Practitioner

## 2019-07-20 NOTE — Telephone Encounter (Signed)
Fyi, this was discontinued

## 2019-07-20 NOTE — Telephone Encounter (Signed)
Noted it  was stopped.  Was being used for depression.

## 2019-07-21 ENCOUNTER — Other Ambulatory Visit: Payer: Self-pay

## 2019-07-21 ENCOUNTER — Telehealth (INDEPENDENT_AMBULATORY_CARE_PROVIDER_SITE_OTHER): Payer: Medicare Other | Admitting: *Deleted

## 2019-07-21 ENCOUNTER — Telehealth: Payer: Self-pay | Admitting: Internal Medicine

## 2019-07-21 ENCOUNTER — Telehealth: Payer: Self-pay | Admitting: *Deleted

## 2019-07-21 DIAGNOSIS — I480 Paroxysmal atrial fibrillation: Secondary | ICD-10-CM

## 2019-07-21 DIAGNOSIS — Z95818 Presence of other cardiac implants and grafts: Secondary | ICD-10-CM

## 2019-07-21 LAB — CUP PACEART REMOTE DEVICE CHECK
Date Time Interrogation Session: 20201218094717
Implantable Pulse Generator Implant Date: 20201210

## 2019-07-21 NOTE — Telephone Encounter (Signed)
New Message    Pt is returning a all she got for her appt today   Please call back

## 2019-07-21 NOTE — Telephone Encounter (Signed)
Attempted to reach patient to complete virtual wound check. No answer at home number, no option to LM. Cell number went straight to VM.

## 2019-07-21 NOTE — Telephone Encounter (Signed)
See virtual visit encounter.

## 2019-07-21 NOTE — Progress Notes (Signed)
ILR wound check via virtual visit. Incision photo sent in via MyChart, incision edges appear fully approximated and well healed. Patient denies any signs/symptoms of infection at this time.   Reviewed manual ILR transmission: Battery status: good. No symptom, tachy, or AF episodes. Pause and brady detection remain off. Patient educated about wound care and [employer] monitor. Monthly summary reports and ROV with Dr. Rayann Heman in 3 months via virtual visit.

## 2019-07-21 NOTE — Telephone Encounter (Signed)
Able to reach patient. See encounter for details.

## 2019-07-21 NOTE — Telephone Encounter (Signed)
Spoke with patient. Seen virtual visit encounter for details.

## 2019-07-23 ENCOUNTER — Other Ambulatory Visit: Payer: Self-pay | Admitting: Psychiatry

## 2019-07-26 ENCOUNTER — Telehealth: Payer: Self-pay | Admitting: Psychiatry

## 2019-07-26 NOTE — Telephone Encounter (Signed)
If she is OK without the pramipexole for depression then leave her off of it.  If depression recurs we can investigate reasons why it was stopped.

## 2019-07-26 NOTE — Telephone Encounter (Signed)
Monica Whitaker called because the pharmacy told her you were not going to refill her mirapex.  She doesn't understand this, and would like a call to discuss this decision.

## 2019-07-31 ENCOUNTER — Ambulatory Visit
Admission: RE | Admit: 2019-07-31 | Discharge: 2019-07-31 | Disposition: A | Discharge status: Home / Self Care | Payer: Medicare Other | Source: Ambulatory Visit | Attending: Internal Medicine | Admitting: Internal Medicine

## 2019-07-31 DIAGNOSIS — M810 Age-related osteoporosis without current pathological fracture: Secondary | ICD-10-CM | POA: Diagnosis present

## 2019-07-31 DIAGNOSIS — Z1231 Encounter for screening mammogram for malignant neoplasm of breast: Secondary | ICD-10-CM | POA: Insufficient documentation

## 2019-08-01 ENCOUNTER — Encounter: Payer: Self-pay | Admitting: Internal Medicine

## 2019-08-01 ENCOUNTER — Other Ambulatory Visit: Payer: Self-pay | Admitting: Internal Medicine

## 2019-08-01 DIAGNOSIS — M810 Age-related osteoporosis without current pathological fracture: Secondary | ICD-10-CM

## 2019-08-09 ENCOUNTER — Other Ambulatory Visit: Payer: Self-pay

## 2019-08-09 ENCOUNTER — Ambulatory Visit (INDEPENDENT_AMBULATORY_CARE_PROVIDER_SITE_OTHER): Payer: PPO

## 2019-08-09 VITALS — Ht 69.5 in | Wt 171.0 lb

## 2019-08-09 DIAGNOSIS — Z0000 Encounter for general adult medical examination without abnormal findings: Secondary | ICD-10-CM

## 2019-08-09 NOTE — Progress Notes (Signed)
Subjective:   Monica Whitaker is a 74 y.o. female who presents for Medicare Annual (Subsequent) preventive examination.  Review of Systems:  No ROS.  Medicare Wellness Virtual Visit.  Visual/audio telehealth visit, UTA vital signs.   Wt/Ht provided. See social history for additional risk factors.   Cardiac Risk Factors include: advanced age (>18men, >37 women)     Objective:     Vitals: Ht 5' 9.5" (1.765 m)   Wt 171 lb (77.6 kg)   BMI 24.89 kg/m   Body mass index is 24.89 kg/m.  Advanced Directives 08/09/2019 12/09/2017 05/07/2017 05/06/2016 01/15/2016 10/30/2015 11/15/2014  Does Patient Have a Medical Advance Directive? Yes Yes Yes Yes Yes No Yes  Type of Paramedic of [place];Living will [place];Living will;Out of facility DNR (pink MOST or yellow form) [place];Living will [place];Living will Living will;Healthcare Power of [place];Living will  Does patient want to make changes to medical advance directive? No - Patient declined - - No - Patient declined No - Patient declined - No - Patient declined  Copy of [place] in Chart? Yes - validated most recent copy scanned in chart (See row information) Yes Yes No - copy requested No - copy requested - No - copy requested  Would patient like information on creating a medical advance directive? - - - - - No - patient declined information -    Tobacco Social History   Tobacco Use  Smoking Status Never Smoker  Smokeless Tobacco Never Used     Counseling given: Not Answered   Clinical Intake:  Pre-visit preparation completed: Yes        Diabetes: No  How often do you need to have someone help you when you read instructions, pamphlets, or other written materials from your doctor or pharmacy?: 1 - Never  Interpreter Needed?: No     Past Medical History:  Diagnosis Date  . Anxiety    controlled with meds  . Anxiety   . Arrhythmia   . Atrial fibrillation ([name])    paroxysmal, failed medical therapy with flecainide,  NSVT with tikosyn  . Bruises easily   . CVA (cerebral vascular accident) ([place]) 12/2007  . Depression    medically controlled   . Glaucoma    also with macular holes AE Dr. Thomasene Ripple  . Low blood pressure    no falls, but if gets up to fast  . RLS (restless legs syndrome)   . Stroke [facility]) 7 years ago   Lasting effects on balance, different personality.  . Thyroid disease   . Tremor   . 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 ATRIAL FIBRILLATION ABLATION  10/18/12   PVI by Dr Rayann Heman  . ATRIAL FIBRILLATION ABLATION N/A 10/18/2012   Procedure: ATRIAL FIBRILLATION ABLATION;  Surgeon: Thompson Grayer, MD;  Location: [facility] CATH LAB;  Service: Cardiovascular;  Laterality: N/A;  . ATRIAL FIBRILLATION ABLATION N/A 04/18/2019   Procedure: ATRIAL FIBRILLATION ABLATION;  Surgeon: Thompson Grayer, MD;  Location: [facility] CV LAB;  Service: Cardiovascular;  Laterality: N/A;  . EYE SURGERY     on L/R eye, macular hole   . implantable loop recorder placement  07/13/2019   MDT Reveal LINQ1 [facility] [id number]) implanted in office by Dr Rayann Heman for afib management post ablation  . OPEN REDUCTION  INTERNAL FIXATION (ORIF) DISTAL RADIAL FRACTURE Right 11/15/2014   Procedure: OPEN REDUCTION INTERNAL FIXATION (ORIF) RIGHT DISTAL RADIAL FRACTURE WITH ALLOGRAFT BONE GRAFT;  Surgeon: Roseanne Kaufman, MD;  Location: [facility];  Service: Orthopedics;  Laterality: Right;  . TEE WITHOUT CARDIOVERSION N/A 10/18/2012   Procedure: TRANSESOPHAGEAL ECHOCARDIOGRAM (TEE);  Surgeon: Lelon Perla, MD;  Location: [facility] ENDOSCOPY;  Service: Cardiovascular;  Laterality: N/A;  Pre-Ablation at 12pm  . TONSILLECTOMY    . VAGINAL HYSTERECTOMY     Family History  Problem Relation Age of Onset  . Multiple  sclerosis Father   . Breast cancer Neg Hx    Social History   Socioeconomic History  . Marital status: Widowed    Spouse name: Not on file  . Number of children: 0  . Years of education: Not on file  . Highest education level: Not on file  Occupational History  . Occupation: Retired    Fish farm manager: RETIRED  Tobacco Use  . Smoking status: Never Smoker  . Smokeless tobacco: Never Used  Substance and Sexual Activity  . Alcohol use: Yes    Alcohol/week: 1.0 standard drinks    Types: 1 Standard drinks or equivalent per week    Comment: regular  . Drug use: No  . Sexual activity: Never  Other Topics Concern  . Not on file  Social History Narrative   Lives in [place] with her husband.  No children 2 step kids.  Former Psychologist, prison and probation services   Enjoys exercise- water classes, yoga Editor, commissioning.    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   Social Determinants of Health   Financial Resource Strain: Low Risk   . Difficulty of Paying Living Expenses: Not hard at all  Food Insecurity: No Food Insecurity  . Worried About Charity fundraiser in the Last Year: Never true  . Ran Out of Food in the Last Year: Never true  Transportation Needs: No Transportation Needs  . Lack of Transportation (Medical): No  . Lack of Transportation (Non-Medical): No  Physical Activity: Sufficiently Active  . Days of Exercise per Week: 7 days  . Minutes of Exercise per Session: 60 min  Stress: No Stress Concern Present  . Feeling of Stress : Only a little  Social Connections: Unknown  . Frequency of Communication with Friends and Family: Three times a week  . Frequency of Social Gatherings with Friends and Family: Once a week  . Attends Religious Services: Not on file  . Active Member of Clubs or Organizations: Not on file  . Attends Archivist Meetings: Not on file  . Marital Status: Widowed      Outpatient Encounter Medications as of 08/09/2019  Medication Sig  . acetaminophen (TYLENOL) 500 MG tablet Take 1,000 mg by mouth every 6 (six) hours as needed (pain). Take 2 tablets by mouth every 6 hours as needed for pain  . amphetamine-dextroamphetamine (ADDERALL XR) 10 MG 24 hr capsule Take 1 capsule (10 mg total) by mouth daily.  . benzonatate (TESSALON) 100 MG capsule Take by mouth as needed for cough.  . calcium carbonate (OS-CAL) 600 MG TABS Take 600 mg by mouth daily with breakfast.   . cholecalciferol (VITAMIN D) 1000 UNITS tablet Take 2,000 Units by mouth daily.   Marland Kitchen CRANBERRY EXTRACT PO Take by mouth.  Marland Kitchen  denosumab (PROLIA) 60 MG/ML SOLN injection Inject 60 mg into the skin every 6 (six) months.   . diltiazem (CARDIZEM CD) 240 MG 24 hr capsule TAKE 1 CAPSULE (240 MG TOTAL) BY MOUTH DAILY. MAY TAKE EXTRA CAPSULE DAILY AS NEEDED FOR AFIB  . DULoxetine (CYMBALTA) 60 MG capsule Take 1 capsule (60 mg total) by mouth 2 (two) times daily. Take with 30 mg duloxetine  . ELIQUIS 5 MG TABS tablet TAKE 1 TABLET BY MOUTH TWICE A DAY  . estradiol (ESTRACE VAGINAL) 0.1 MG/GM vaginal cream Apply 0.5mg  (pea-sized amount)  just inside the vaginal introitus with a finger-tip on Monday, Wednesday and Friday nights.  . flecainide (TAMBOCOR) 100 MG tablet TAKE 1 TABLET BY MOUTH TWICE A DAY  . gabapentin (NEURONTIN) 100 MG capsule TAKE 100 MG TWICE A DAY FOR ONE WEEK, THEN INCREASE TO 200 MG(2 TABLETS) TWICE A DAY AND CONTINUE  . levothyroxine (SYNTHROID) 25 MCG tablet Take 1 tablet (25 mcg total) by mouth daily. 30 minutes before food  . Melatonin ([facility] MELATONIN) 5 MG SUBL Place under the tongue.  . Multiple Vitamin (MULTIVITAMIN) tablet Take 1 tablet by mouth daily.    . pravastatin (PRAVACHOL) 20 MG tablet Take 1 tablet (20 mg total) by mouth at bedtime.  Marland Kitchen SALT SUBSTITUTES PO Take 1 tablet by mouth.  . timolol (TIMOPTIC) 0.5 % ophthalmic solution Place 1 drop into the left eye daily.   Marland Kitchen  triamcinolone cream (KENALOG) 0.1 % Apply 1 application topically 2 (two) times daily. Lower legs prn red dry rash  . vitamin B-12 (CYANOCOBALAMIN) 1000 MCG tablet Take 1,000 mcg by mouth daily.  . vitamin C (ASCORBIC ACID) 500 MG tablet Take 500 mg by mouth daily.  . vitamin E 400 UNIT capsule Take 400 Units by mouth daily.     No facility-administered encounter medications on file as of 08/09/2019.    Activities of Daily Living In your present state of health, do you have any difficulty performing the following activities: 08/09/2019  Hearing? N  Vision? N  Difficulty concentrating or making decisions? Y  Walking or climbing stairs? N  Dressing or bathing? N  Doing errands, shopping? N  Preparing Food and eating ? N  Using the Toilet? N  In the past six months, have you accidently leaked urine? N  Do you have problems with loss of bowel control? N  Managing your Medications? N  Managing your Finances? N  Housekeeping or managing your Housekeeping? N  Some recent data might be hidden    Patient Care Team: McLean-Scocuzza, Nino Glow, MD as PCP - General (Internal Medicine) Sherran Needs, NP as Nurse Practitioner (Nurse Practitioner) Thompson Grayer, MD as Consulting Physician (Cardiology) Roseanne Kaufman, MD as Consulting Physician (Orthopedic Surgery) Josefine Class, MD as Referring Physician (Gastroenterology) Estill Cotta, MD as Consulting Physician (Ophthalmology)    Assessment:   This is a routine wellness examination for [place].  Nurse connected with patient 08/09/19 at 11:30 AM EST by a telephone enabled telemedicine application and verified that I am speaking with the correct person using two identifiers. Patient stated full name and DOB. Patient gave permission to continue with virtual visit. Patient's location was at home and Nurse's location was at [place] office.   Patient is alert and oriented x3. Patient has some difficulty remembering since her stroke,  however notices improvement.  Patient likes to read and play games on cell and challenge herself to recall phone numbers and names for brain stimulation.  Health Maintenance Due: See completed HM at the end of note.   Eye: Visual acuity not assessed. Virtual visit. Followed by their ophthalmologist. Visits every 6 months.   Dental: Visits every 6-12 months.    Hearing: Demonstrates normal hearing during visit.  Safety:  Patient feels safe at home- yes Patient does have smoke detectors at home- yes Patient does wear sunscreen or protective clothing when in direct sunlight - yes Patient does wear seat belt when in a moving vehicle - yes Patient drives- yes Adequate lighting in walkways free from debris- yes Grab bars and handrails used as appropriate- yes Ambulates with an assistive device- no Cell phone on person when ambulating outside of the home- yes  Social: Alcohol intake - yes      Smoking history- never   Smokers in home? none Illicit drug use? none  Medication: Taking as directed and without issues.  Pill box in use -yes  Self managed - yes   Covid-19: Precautions and sickness symptoms discussed. Wears mask, social distancing, hand hygiene as appropriate.   Activities of Daily Living Patient denies needing assistance with: household chores, feeding themselves, getting from bed to chair, getting to the toilet, bathing/showering, dressing, managing money, or preparing meals.   Discussed the importance of a healthy diet, water intake and the benefits of aerobic exercise.   Physical activity- yoga twice weekly, walking 1 mile daily, floor exercises.  Diet:  Low cholesterol Water: good intake Caffeine: 1 cup of coffee  Other Providers Patient Care Team: McLean-Scocuzza, Nino Glow, MD as PCP - General (Internal Medicine) Sherran Needs, NP as Nurse Practitioner (Nurse Practitioner) Thompson Grayer, MD as Consulting Physician (Cardiology) Roseanne Kaufman, MD as  Consulting Physician (Orthopedic Surgery) Josefine Class, MD as Referring Physician (Gastroenterology) Estill Cotta, MD as Consulting Physician (Ophthalmology)  Exercise Activities and Dietary recommendations Current Exercise Habits: Home exercise routine, Type of exercise: walking;yoga, Intensity: Moderate  Goals      Patient Stated   . I want to walk up/down the stairs often to strengthen my legs more (pt-stated)       Fall Risk Fall Risk  08/09/2019 06/16/2019 05/25/2019 05/10/2019 05/26/2018  Falls in the past year? 0 0 0 0 No  Comment - - - - -  Number falls in past yr: - - - - -  Injury with Fall? - - - - -  Comment - - - - -  Risk for fall due to : - - - - -  Follow up Falls prevention discussed - - - -   Timed Get Up and Go performed: no, virtual visit  Depression Screen PHQ 2/9 Scores 08/09/2019 06/16/2019 05/25/2019 05/10/2019  PHQ - 2 Score - 2 2 0  PHQ- 9 Score - - 7 -  Exception Documentation Other- indicate reason in comment box - - -     Cognitive Function MMSE - Mini Mental State Exam 05/07/2017 05/06/2016  Orientation to time 5 5  Orientation to Place 5 5  Registration 3 3  Attention/ Calculation 0 0  Recall 3 2  Recall-comments - pt was unable to recall 1 of 3 words  Language- name 2 objects 0 0  Language- repeat 1 1  Language- follow 3 step command 3 3  Language- read & follow direction 0 0  Write a sentence 0 0  Copy design 0 0  Total score 20 19     6CIT Screen 08/09/2019  What Year? 0 points  What month? 0 points  What time? 0 points  Count back from 20 0 points  Months in reverse 0 points  Repeat phrase 0 points  Total Score 0    Immunization History  Administered Date(s) Administered  . Fluad Quad(high Dose 65+) 05/10/2019  . Influenza Split 05/21/2011, 05/31/2012  . Influenza Whole 06/02/2010  . Influenza, High Dose Seasonal PF 05/24/2014  . Influenza,inj,Quad PF,6+ Mos 04/04/2015, 05/06/2016, 05/13/2017, 05/10/2018  .  Pneumococcal Conjugate-13 10/02/2013  . Pneumococcal Polysaccharide-23 04/04/2015  . Td 04/18/2008  . Tdap 10/30/2015  . Zoster 04/18/2008  . Zoster Recombinat (Shingrix) 01/27/2018, 03/13/2019   Screening Tests Health Maintenance  Topic Date Due  . MAMMOGRAM  07/30/2021  . COLONOSCOPY  03/06/2024  . TETANUS/TDAP  10/29/2025  . INFLUENZA VACCINE  Completed  . DEXA SCAN  Completed  . Hepatitis C Screening  Completed  . PNA vac Low Risk Adult  Completed      Plan:   Keep all routine maintenance appointments.   Follow up 09/22/19 @ 9:30  Medicare Attestation I have personally reviewed: The patient's medical and social history Their use of alcohol, tobacco or illicit drugs Their current medications and supplements The patient's functional ability including ADLs,fall risks, home safety risks, cognitive, and hearing and visual impairment Diet and physical activities Evidence for depression    have reviewed and discussed with patient certain preventive protocols, quality metrics, and best practice recommendations.     Varney Biles, LPN  [id number]

## 2019-08-09 NOTE — Patient Instructions (Addendum)
  Ms. Claes , Thank you for taking time to come for your Medicare Wellness Visit. I appreciate your ongoing commitment to your health goals. Please review the following plan we discussed and let me know if I can assist you in the future.   These are the goals we discussed: Goals      Patient Stated   . I want to walk up/down the stairs often to strengthen my legs more (pt-stated)       This is a list of the screening recommended for you and due dates:  Health Maintenance  Topic Date Due  . Mammogram  07/30/2021  . Colon Cancer Screening  03/06/2024  . Tetanus Vaccine  10/29/2025  . Flu Shot  Completed  . DEXA scan (bone density measurement)  Completed  .  Hepatitis C: One time screening is recommended by Center for Disease Control  (CDC) for  adults born from [birth year] through 1964.   Completed  . Pneumonia vaccines  Completed

## 2019-08-15 ENCOUNTER — Ambulatory Visit: Payer: Medicare Other | Admitting: Gastroenterology

## 2019-08-15 ENCOUNTER — Ambulatory Visit (INDEPENDENT_AMBULATORY_CARE_PROVIDER_SITE_OTHER): Payer: PPO | Admitting: *Deleted

## 2019-08-15 DIAGNOSIS — I480 Paroxysmal atrial fibrillation: Secondary | ICD-10-CM

## 2019-08-15 LAB — CUP PACEART REMOTE DEVICE CHECK
Date Time Interrogation Session: 20210112112939
Implantable Pulse Generator Implant Date: 20201210

## 2019-08-16 LAB — CUP PACEART REMOTE DEVICE CHECK
Date Time Interrogation Session: 20210111000500
Implantable Pulse Generator Implant Date: 20201210

## 2019-08-17 ENCOUNTER — Encounter: Payer: Self-pay | Admitting: Internal Medicine

## 2019-08-22 ENCOUNTER — Telehealth: Payer: Self-pay | Admitting: Psychiatry

## 2019-08-22 ENCOUNTER — Encounter: Payer: Self-pay | Admitting: Internal Medicine

## 2019-08-22 NOTE — Telephone Encounter (Signed)
Pt is feeling better mentally and physically than she has in years. The Cymbalta 60mg  bid is seeming to be working. Doing so much that she hasn't done in years. She's very happy and like a different person!

## 2019-08-22 NOTE — Telephone Encounter (Signed)
Excellent report!

## 2019-09-05 ENCOUNTER — Other Ambulatory Visit: Payer: Self-pay

## 2019-09-05 ENCOUNTER — Telehealth: Payer: Self-pay | Admitting: Psychiatry

## 2019-09-05 DIAGNOSIS — F419 Anxiety disorder, unspecified: Secondary | ICD-10-CM

## 2019-09-05 DIAGNOSIS — F908 Attention-deficit hyperactivity disorder, other type: Secondary | ICD-10-CM

## 2019-09-05 MED ORDER — AMPHETAMINE-DEXTROAMPHET ER 10 MG PO CP24: 10.0000 mg | ORAL_CAPSULE | Freq: Every day | ORAL | 0 refills | Status: DC

## 2019-09-05 MED ORDER — DULOXETINE HCL 60 MG PO CPEP: 60.0000 mg | ORAL_CAPSULE | Freq: Two times a day (BID) | ORAL | 0 refills | Status: DC

## 2019-09-05 NOTE — Telephone Encounter (Signed)
Last refill for Adderall 06/20/2019 for 90 day, pended for Dr. Clovis Pu to submit to [facility]  Duloxetine 60 mg bid [phone]   Last office visit was 11/2018

## 2019-09-05 NOTE — Telephone Encounter (Signed)
Pt requesting a refill on her Cymbalta. [facility] [address] in [place]. She is also requesting a refill on the Adderall to be filled at [facility] in [place].

## 2019-09-06 DIAGNOSIS — R251 Tremor, unspecified: Secondary | ICD-10-CM | POA: Diagnosis not present

## 2019-09-06 DIAGNOSIS — R202 Paresthesia of skin: Secondary | ICD-10-CM | POA: Diagnosis not present

## 2019-09-06 DIAGNOSIS — Z8673 Personal history of transient ischemic attack (TIA), and cerebral infarction without residual deficits: Secondary | ICD-10-CM | POA: Diagnosis not present

## 2019-09-13 DIAGNOSIS — M810 Age-related osteoporosis without current pathological fracture: Secondary | ICD-10-CM | POA: Diagnosis not present

## 2019-09-18 ENCOUNTER — Ambulatory Visit (INDEPENDENT_AMBULATORY_CARE_PROVIDER_SITE_OTHER): Payer: PPO | Admitting: *Deleted

## 2019-09-18 DIAGNOSIS — I480 Paroxysmal atrial fibrillation: Secondary | ICD-10-CM

## 2019-09-18 LAB — CUP PACEART REMOTE DEVICE CHECK
Date Time Interrogation Session: 20210215002057
Implantable Pulse Generator Implant Date: 20201210

## 2019-09-19 NOTE — Progress Notes (Signed)
ILR Remote 

## 2019-09-22 ENCOUNTER — Ambulatory Visit: Payer: Medicare Other | Admitting: Internal Medicine

## 2019-09-22 ENCOUNTER — Ambulatory Visit (INDEPENDENT_AMBULATORY_CARE_PROVIDER_SITE_OTHER): Payer: PPO | Admitting: Internal Medicine

## 2019-09-22 ENCOUNTER — Encounter: Payer: Self-pay | Admitting: Internal Medicine

## 2019-09-22 ENCOUNTER — Other Ambulatory Visit: Payer: Self-pay

## 2019-09-22 VITALS — Ht 69.0 in | Wt 165.0 lb

## 2019-09-22 DIAGNOSIS — M25512 Pain in left shoulder: Secondary | ICD-10-CM | POA: Diagnosis not present

## 2019-09-22 DIAGNOSIS — E785 Hyperlipidemia, unspecified: Secondary | ICD-10-CM

## 2019-09-22 DIAGNOSIS — E039 Hypothyroidism, unspecified: Secondary | ICD-10-CM | POA: Diagnosis not present

## 2019-09-22 DIAGNOSIS — F341 Dysthymic disorder: Secondary | ICD-10-CM | POA: Diagnosis not present

## 2019-09-22 DIAGNOSIS — G250 Essential tremor: Secondary | ICD-10-CM | POA: Insufficient documentation

## 2019-09-22 DIAGNOSIS — E782 Mixed hyperlipidemia: Secondary | ICD-10-CM | POA: Insufficient documentation

## 2019-09-22 MED ORDER — GABAPENTIN 100 MG PO CAPS: 200.0000 mg | ORAL_CAPSULE | Freq: Two times a day (BID) | ORAL | Status: DC

## 2019-09-22 NOTE — Patient Instructions (Signed)
Consider tumeric/ginger/curcumin Petra Kuba made or Petra Kuba wise brand  voltaren gel over the counter  Consider Xray left shoulder   Shoulder Exercises Ask your health care provider which exercises are safe for you. Do exercises exactly as told by your health care provider and adjust them as directed. It is normal to feel mild stretching, pulling, tightness, or discomfort as you do these exercises. Stop right away if you feel sudden pain or your pain gets worse. Do not begin these exercises until told by your health care provider. Stretching exercises External rotation and abduction This exercise is sometimes called corner stretch. This exercise rotates your arm outward (external rotation) and moves your arm out from your body (abduction). 1. Stand in a doorway with one of your feet slightly in front of the other. This is called a staggered stance. If you cannot reach your forearms to the door frame, stand facing a corner of a room. 2. Choose one of the following positions as told by your health care provider: ? Place your hands and forearms on the door frame above your head. ? Place your hands and forearms on the door frame at the height of your head. ? Place your hands on the door frame at the height of your elbows. 3. Slowly move your weight onto your front foot until you feel a stretch across your chest and in the front of your shoulders. Keep your head and chest upright and keep your abdominal muscles tight. 4. Hold for __________ seconds. 5. To release the stretch, shift your weight to your back foot. Repeat __________ times. Complete this exercise __________ times a day. Extension, standing 1. Stand and hold a broomstick, a cane, or a similar object behind your back. ? Your hands should be a little wider than shoulder width apart. ? Your palms should face away from your back. 2. Keeping your elbows straight and your shoulder muscles relaxed, move the stick away from your body until you feel a  stretch in your shoulders (extension). ? Avoid shrugging your shoulders while you move the stick. Keep your shoulder blades tucked down toward the middle of your back. 3. Hold for __________ seconds. 4. Slowly return to the starting position. Repeat __________ times. Complete this exercise __________ times a day. Range-of-motion exercises Pendulum  1. Stand near a wall or a surface that you can hold onto for balance. 2. Bend at the waist and let your left / right arm hang straight down. Use your other arm to support you. Keep your back straight and do not lock your knees. 3. Relax your left / right arm and shoulder muscles, and move your hips and your trunk so your left / right arm swings freely. Your arm should swing because of the motion of your body, not because you are using your arm or shoulder muscles. 4. Keep moving your hips and trunk so your arm swings in the following directions, as told by your health care provider: ? Side to side. ? Forward and backward. ? In clockwise and counterclockwise circles. 5. Continue each motion for __________ seconds, or for as long as told by your health care provider. 6. Slowly return to the starting position. Repeat __________ times. Complete this exercise __________ times a day. Shoulder flexion, standing  1. Stand and hold a broomstick, a cane, or a similar object. Place your hands a little more than shoulder width apart on the object. Your left / right hand should be palm up, and your other hand should be  palm down. 2. Keep your elbow straight and your shoulder muscles relaxed. Push the stick up with your healthy arm to raise your left / right arm in front of your body, and then over your head until you feel a stretch in your shoulder (flexion). ? Avoid shrugging your shoulder while you raise your arm. Keep your shoulder blade tucked down toward the middle of your back. 3. Hold for __________ seconds. 4. Slowly return to the starting  position. Repeat __________ times. Complete this exercise __________ times a day. Shoulder abduction, standing 1. Stand and hold a broomstick, a cane, or a similar object. Place your hands a little more than shoulder width apart on the object. Your left / right hand should be palm up, and your other hand should be palm down. 2. Keep your elbow straight and your shoulder muscles relaxed. Push the object across your body toward your left / right side. Raise your left / right arm to the side of your body (abduction) until you feel a stretch in your shoulder. ? Do not raise your arm above shoulder height unless your health care provider tells you to do that. ? If directed, raise your arm over your head. ? Avoid shrugging your shoulder while you raise your arm. Keep your shoulder blade tucked down toward the middle of your back. 3. Hold for __________ seconds. 4. Slowly return to the starting position. Repeat __________ times. Complete this exercise __________ times a day. Internal rotation  1. Place your left / right hand behind your back, palm up. 2. Use your other hand to dangle an exercise band, a towel, or a similar object over your shoulder. Grasp the band with your left / right hand so you are holding on to both ends. 3. Gently pull up on the band until you feel a stretch in the front of your left / right shoulder. The movement of your arm toward the center of your body is called internal rotation. ? Avoid shrugging your shoulder while you raise your arm. Keep your shoulder blade tucked down toward the middle of your back. 4. Hold for __________ seconds. 5. Release the stretch by letting go of the band and lowering your hands. Repeat __________ times. Complete this exercise __________ times a day. Strengthening exercises External rotation  1. Sit in a stable chair without armrests. 2. Secure an exercise band to a stable object at elbow height on your left / right side. 3. Place a soft  object, such as a folded towel or a small pillow, between your left / right upper arm and your body to move your elbow about 4 inches (10 cm) away from your side. 4. Hold the end of the exercise band so it is tight and there is no slack. 5. Keeping your elbow pressed against the soft object, slowly move your forearm out, away from your abdomen (external rotation). Keep your body steady so only your forearm moves. 6. Hold for __________ seconds. 7. Slowly return to the starting position. Repeat __________ times. Complete this exercise __________ times a day. Shoulder abduction  1. Sit in a stable chair without armrests, or stand up. 2. Hold a __________ weight in your left / right hand, or hold an exercise band with both hands. 3. Start with your arms straight down and your left / right palm facing in, toward your body. 4. Slowly lift your left / right hand out to your side (abduction). Do not lift your hand above shoulder height unless your  health care provider tells you that this is safe. ? Keep your arms straight. ? Avoid shrugging your shoulder while you do this movement. Keep your shoulder blade tucked down toward the middle of your back. 5. Hold for __________ seconds. 6. Slowly lower your arm, and return to the starting position. Repeat __________ times. Complete this exercise __________ times a day. Shoulder extension 1. Sit in a stable chair without armrests, or stand up. 2. Secure an exercise band to a stable object in front of you so it is at shoulder height. 3. Hold one end of the exercise band in each hand. Your palms should face each other. 4. Straighten your elbows and lift your hands up to shoulder height. 5. Step back, away from the secured end of the exercise band, until the band is tight and there is no slack. 6. Squeeze your shoulder blades together as you pull your hands down to the sides of your thighs (extension). Stop when your hands are straight down by your sides. Do  not let your hands go behind your body. 7. Hold for __________ seconds. 8. Slowly return to the starting position. Repeat __________ times. Complete this exercise __________ times a day. Shoulder row 1. Sit in a stable chair without armrests, or stand up. 2. Secure an exercise band to a stable object in front of you so it is at waist height. 3. Hold one end of the exercise band in each hand. Position your palms so that your thumbs are facing the ceiling (neutral position). 4. Bend each of your elbows to a 90-degree angle (right angle) and keep your upper arms at your sides. 5. Step back until the band is tight and there is no slack. 6. Slowly pull your elbows back behind you. 7. Hold for __________ seconds. 8. Slowly return to the starting position. Repeat __________ times. Complete this exercise __________ times a day. Shoulder press-ups  1. Sit in a stable chair that has armrests. Sit upright, with your feet flat on the floor. 2. Put your hands on the armrests so your elbows are bent and your fingers are pointing forward. Your hands should be about even with the sides of your body. 3. Push down on the armrests and use your arms to lift yourself off the chair. Straighten your elbows and lift yourself up as much as you comfortably can. ? Move your shoulder blades down, and avoid letting your shoulders move up toward your ears. ? Keep your feet on the ground. As you get stronger, your feet should support less of your body weight as you lift yourself up. 4. Hold for __________ seconds. 5. Slowly lower yourself back into the chair. Repeat __________ times. Complete this exercise __________ times a day. Wall push-ups  1. Stand so you are facing a stable wall. Your feet should be about one arm-length away from the wall. 2. Lean forward and place your palms on the wall at shoulder height. 3. Keep your feet flat on the floor as you bend your elbows and lean forward toward the wall. 4. Hold for  __________ seconds. 5. Straighten your elbows to push yourself back to the starting position. Repeat __________ times. Complete this exercise __________ times a day. This information is not intended to replace advice given to you by your health care provider. Make sure you discuss any questions you have with your health care provider. Document Revised: 11/11/2018 Document Reviewed: 08/19/2018 Elsevier Patient Education  [place].

## 2019-09-22 NOTE — Progress Notes (Signed)
telephone Note  I connected with Monica Whitaker  on 09/22/19 at  3:15 PM EST telephone verified that I am speaking with the correct person using two identifiers.  Location patient: home Location provider:work or home office Persons participating in the virtual visit: patient, provider  I discussed the limitations of evaluation and management by telemedicine and the availability of in person appointments. The patient expressed understanding and agreed to proceed.   HPI: 1. Essential tremor better on gabapentin 200 mg bid  2. HLD on pravachol 20 mg qhs tolerating  Labs 09/13/19 cmet, tsh, vitamin D 46.4 normal  3. Pancreatitic cyst appt with Dr. Allen Norris appt 10/12/19  4. Anxiety/depression mood improved she feels best since 07/2019 something changed and making new friends less trouble talking to to people/making friends. Mood better  5. Left shoulder pain 1/10 intermittent declines Xray  Yoga 2-3 x per week ROS: See pertinent positives and negatives per HPI.  Past Medical History:  Diagnosis Date  . Anxiety    controlled with meds  . Anxiety   . Arrhythmia   . Atrial fibrillation ([name])    paroxysmal, failed medical therapy with flecainide,  NSVT with tikosyn  . Bruises easily   . CVA (cerebral vascular accident) ([place]) 12/2007  . Depression    medically controlled   . Glaucoma    also with macular holes AE Dr. Thomasene Ripple  . Low blood pressure    no falls, but if gets up to fast  . RLS (restless legs syndrome)   . Stroke [facility]) 7 years ago   Lasting effects on balance, different personality.  . Thyroid disease   . Tremor   . 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 ATRIAL FIBRILLATION ABLATION  10/18/12   PVI by Dr Rayann Heman  . ATRIAL FIBRILLATION ABLATION N/A 10/18/2012   Procedure: ATRIAL FIBRILLATION ABLATION;  Surgeon: Thompson Grayer, MD;  Location: [facility] CATH LAB;  Service: Cardiovascular;   Laterality: N/A;  . ATRIAL FIBRILLATION ABLATION N/A 04/18/2019   Procedure: ATRIAL FIBRILLATION ABLATION;  Surgeon: Thompson Grayer, MD;  Location: [facility] CV LAB;  Service: Cardiovascular;  Laterality: N/A;  . EYE SURGERY     on L/R eye, macular hole   . implantable loop recorder placement  07/13/2019   MDT Reveal LINQ1 [facility] [id number]) implanted in office by Dr Rayann Heman for afib management post ablation  . OPEN REDUCTION INTERNAL FIXATION (ORIF) DISTAL RADIAL FRACTURE Right 11/15/2014   Procedure: OPEN REDUCTION INTERNAL FIXATION (ORIF) RIGHT DISTAL RADIAL FRACTURE WITH ALLOGRAFT BONE GRAFT;  Surgeon: Roseanne Kaufman, MD;  Location: [place];  Service: Orthopedics;  Laterality: Right;  . TEE WITHOUT CARDIOVERSION N/A 10/18/2012   Procedure: TRANSESOPHAGEAL ECHOCARDIOGRAM (TEE);  Surgeon: Lelon Perla, MD;  Location: [facility] ENDOSCOPY;  Service: Cardiovascular;  Laterality: N/A;  Pre-Ablation at 12pm  . TONSILLECTOMY    . VAGINAL HYSTERECTOMY      Family History  Problem Relation Age of Onset  . Multiple sclerosis Father   . Breast cancer Neg Hx     SOCIAL HX: widowed  Current Outpatient Medications:  .  acetaminophen (TYLENOL) 500 MG tablet, Take 1,000 mg by mouth every 6 (six) hours as needed (pain). Take 2 tablets by mouth every 6 hours as needed for pain, Disp: , Rfl:  .  amphetamine-dextroamphetamine (ADDERALL XR) 10 MG 24 hr capsule, Take 1 capsule (10 mg total) by  mouth daily., Disp: 90 capsule, Rfl: 0 .  calcium carbonate (OS-CAL) 600 MG TABS, Take 600 mg by mouth daily with breakfast. , Disp: , Rfl:  .  cholecalciferol (VITAMIN D) 1000 UNITS tablet, Take 2,000 Units by mouth daily. , Disp: , Rfl:  .  CRANBERRY EXTRACT PO, Take by mouth., Disp: , Rfl:  .  denosumab (PROLIA) 60 MG/ML SOLN injection, Inject 60 mg into the skin every 6 (six) months. , Disp: , Rfl:  .  diltiazem (CARDIZEM CD) 240 MG 24 hr capsule, TAKE 1 CAPSULE (240 MG TOTAL) BY MOUTH DAILY. MAY TAKE  EXTRA CAPSULE DAILY AS NEEDED FOR AFIB, Disp: 95 capsule, Rfl: 3 .  DULoxetine (CYMBALTA) 60 MG capsule, Take 1 capsule (60 mg total) by mouth 2 (two) times daily., Disp: 180 capsule, Rfl: 0 .  ELIQUIS 5 MG TABS tablet, TAKE 1 TABLET BY MOUTH TWICE A DAY, Disp: 180 tablet, Rfl: 1 .  estradiol (ESTRACE VAGINAL) 0.1 MG/GM vaginal cream, Apply 0.5mg  (pea-sized amount)  just inside the vaginal introitus with a finger-tip on Monday, Wednesday and Friday nights., Disp: 30 g, Rfl: 12 .  flecainide (TAMBOCOR) 100 MG tablet, TAKE 1 TABLET BY MOUTH TWICE A DAY, Disp: 180 tablet, Rfl: 2 .  gabapentin (NEURONTIN) 100 MG capsule, Take 2 capsules (200 mg total) by mouth 2 (two) times daily., Disp: , Rfl:  .  levothyroxine (SYNTHROID) 25 MCG tablet, Take 1 tablet (25 mcg total) by mouth daily. 30 minutes before food, Disp: 90 tablet, Rfl: 3 .  Multiple Vitamin (MULTIVITAMIN) tablet, Take 1 tablet by mouth daily.  , Disp: , Rfl:  .  pravastatin (PRAVACHOL) 20 MG tablet, Take 1 tablet (20 mg total) by mouth at bedtime., Disp: 90 tablet, Rfl: 3 .  SALT SUBSTITUTES PO, Take 1 tablet by mouth., Disp: , Rfl:  .  timolol (TIMOPTIC) 0.5 % ophthalmic solution, Place 1 drop into the left eye daily. , Disp: , Rfl:  .  vitamin B-12 (CYANOCOBALAMIN) 1000 MCG tablet, Take 1,000 mcg by mouth daily., Disp: , Rfl:  .  vitamin C (ASCORBIC ACID) 500 MG tablet, Take 500 mg by mouth daily., Disp: , Rfl:  .  vitamin E 400 UNIT capsule, Take 400 Units by mouth daily.  , Disp: , Rfl:  .  Melatonin ([employer] MELATONIN) 5 MG SUBL, Place under the tongue., Disp: , Rfl:   EXAM:  VITALS per patient if applicable:  GENERAL: alert, oriented, appears well and in no acute distress  PSYCH/NEURO: pleasant and cooperative, no obvious depression or anxiety, speech and thought processing grossly intact  ASSESSMENT AND PLAN:  Discussed the following assessment and plan:  Essential tremor - Plan: gabapentin (NEURONTIN) 200 MG capsule bid F/u Dr.  Melrose Nakayama   ANXIETY DEPRESSION Cont f/u [place] Improved   Acute pain of left shoulder Consider Xray Left shoulder and ortho in future  HLD On pravastatin Check lipid   HM Flu shot utd  utd prevnar  pna 23 due 04/2020  Tdap utd  shingrix had 2/2 shots covid vx had 2/2   Pap out of age window  mammo 07/31/19 negative  Colonoscopy had 03/06/14 diverticulosis  02/17/16 osteoporosis reordered another  dexa + osteoporosis next prolia shot due 1123//2020 due 12/24/18  Skin [facility] derm pt to [place] 10/2019  Echo had 04/11/2019 ef 60-65% She is doing exercise PT at [facility] Dr. Allen Norris  Dental Dr. Sandrea Hughs will establish   -we discussed possible serious and likely etiologies, options for evaluation  and workup, limitations of telemedicine visit vs in person visit, treatment, treatment risks and precautions. Pt prefers to treat via telemedicine empirically rather then risking or undertaking an in person visit at this moment. Patient agrees to seek prompt in person care if worsening, new symptoms arise, or if is not improving with treatment.   I discussed the assessment and treatment plan with the patient. The patient was provided an opportunity to ask questions and all were answered. The patient agreed with the plan and demonstrated an understanding of the instructions.   The patient was advised to call back or seek an in-person evaluation if the symptoms worsen or if the condition fails to improve as anticipated.  Time spent 20 minutes  Delorise Jackson, MD

## 2019-09-26 NOTE — Progress Notes (Signed)
Verification completed 06/23/19 not due for reverification until 06/22/20

## 2019-10-09 ENCOUNTER — Encounter: Payer: Self-pay | Admitting: Internal Medicine

## 2019-10-11 ENCOUNTER — Encounter: Payer: Self-pay | Admitting: Internal Medicine

## 2019-10-11 ENCOUNTER — Other Ambulatory Visit: Payer: Self-pay

## 2019-10-11 ENCOUNTER — Telehealth (INDEPENDENT_AMBULATORY_CARE_PROVIDER_SITE_OTHER): Payer: PPO | Admitting: Internal Medicine

## 2019-10-11 VITALS — BP 95/71 | HR 78 | Ht 69.0 in | Wt 166.0 lb

## 2019-10-11 DIAGNOSIS — D6869 Other thrombophilia: Secondary | ICD-10-CM

## 2019-10-11 DIAGNOSIS — I480 Paroxysmal atrial fibrillation: Secondary | ICD-10-CM | POA: Diagnosis not present

## 2019-10-11 NOTE — Progress Notes (Signed)
Electrophysiology TeleHealth Note  Due to national recommendations of social distancing due to [place] 19, an audio telehealth visit is felt to be most appropriate for this patient at this time.  Verbal consent was obtained by me for the telehealth visit today.  The patient does not have capability for a virtual visit.  A phone visit is therefore required today.   Date:  10/11/2019   ID:  Monica Whitaker, DOB 1945/10/04, MRN [id number]  Location: patient's home  Provider location:  [place]  Evaluation Performed: Follow-up visit  PCP:  McLean-Scocuzza, Nino Glow, MD   Electrophysiologist:  Dr Rayann Heman  Chief Complaint:  palpitations  History of Present Illness:    Monica Whitaker is a 74 y.o. female who presents via telehealth conferencing today.  Since last being seen in our clinic, the patient reports doing very well.  "Things are better than they have been in years".   Today, she denies symptoms of palpitations, chest pain, shortness of breath,  lower extremity edema, dizziness, presyncope, or syncope.  The patient is otherwise without complaint today.  The patient denies symptoms of fevers, chills, cough, or new SOB worrisome for COVID 19.  Past Medical History:  Diagnosis Date  . Anxiety    controlled with meds  . Anxiety   . Arrhythmia   . Atrial fibrillation ([name])    paroxysmal, failed medical therapy with flecainide,  NSVT with tikosyn  . Bruises easily   . CVA (cerebral vascular accident) ([place]) 12/2007  . Depression    medically controlled   . Glaucoma    also with macular holes AE Dr. Thomasene Ripple  . Low blood pressure    no falls, but if gets up to fast  . RLS (restless legs syndrome)   . Stroke [facility]) 7 years ago   Lasting effects on balance, different personality.  . Thyroid disease   . Tremor   . Ventricular tachycardia ([place])    pt told at [facility] that she had RVOT VT    Past Surgical History:  Procedure Laterality Date  . ATRIAL FIBRILLATION ABLATION   10/18/12   PVI by Dr Rayann Heman  . ATRIAL FIBRILLATION ABLATION N/A 10/18/2012   Procedure: ATRIAL FIBRILLATION ABLATION;  Surgeon: Thompson Grayer, MD;  Location: [facility] CATH LAB;  Service: Cardiovascular;  Laterality: N/A;  . ATRIAL FIBRILLATION ABLATION N/A 04/18/2019   Procedure: ATRIAL FIBRILLATION ABLATION;  Surgeon: Thompson Grayer, MD;  Location: [place] CV LAB;  Service: Cardiovascular;  Laterality: N/A;  . EYE SURGERY     on L/R eye, macular hole   . implantable loop recorder placement  07/13/2019   MDT Reveal LINQ1 [facility] [id number]) implanted in office by Dr Rayann Heman for afib management post ablation  . OPEN REDUCTION INTERNAL FIXATION (ORIF) DISTAL RADIAL FRACTURE Right 11/15/2014   Procedure: OPEN REDUCTION INTERNAL FIXATION (ORIF) RIGHT DISTAL RADIAL FRACTURE WITH ALLOGRAFT BONE GRAFT;  Surgeon: Roseanne Kaufman, MD;  Location: [place];  Service: Orthopedics;  Laterality: Right;  . TEE WITHOUT CARDIOVERSION N/A 10/18/2012   Procedure: TRANSESOPHAGEAL ECHOCARDIOGRAM (TEE);  Surgeon: Lelon Perla, MD;  Location: [facility] ENDOSCOPY;  Service: Cardiovascular;  Laterality: N/A;  Pre-Ablation at 12pm  . TONSILLECTOMY    . VAGINAL HYSTERECTOMY      Current Outpatient Medications  Medication Sig Dispense Refill  . acetaminophen (TYLENOL) 500 MG tablet Take 1,000 mg by mouth every 6 (six) hours as needed (pain). Take 2 tablets by mouth every 6 hours as needed for pain    .  amphetamine-dextroamphetamine (ADDERALL XR) 10 MG 24 hr capsule Take 1 capsule (10 mg total) by mouth daily. 90 capsule 0  . calcium carbonate (OS-CAL) 600 MG TABS Take 600 mg by mouth daily with breakfast.     . cholecalciferol (VITAMIN D) 1000 UNITS tablet Take 2,000 Units by mouth daily.     Marland Kitchen CRANBERRY EXTRACT PO Take by mouth.    . denosumab (PROLIA) 60 MG/ML SOLN injection Inject 60 mg into the skin every 6 (six) months.     . diltiazem (CARDIZEM CD) 240 MG 24 hr capsule TAKE 1 CAPSULE (240 MG TOTAL) BY MOUTH  DAILY. MAY TAKE EXTRA CAPSULE DAILY AS NEEDED FOR AFIB 95 capsule 3  . DULoxetine (CYMBALTA) 60 MG capsule Take 1 capsule (60 mg total) by mouth 2 (two) times daily. 180 capsule 0  . ELIQUIS 5 MG TABS tablet TAKE 1 TABLET BY MOUTH TWICE A DAY 180 tablet 1  . estradiol (ESTRACE VAGINAL) 0.1 MG/GM vaginal cream Apply 0.5mg  (pea-sized amount)  just inside the vaginal introitus with a finger-tip on Monday, Wednesday and Friday nights. 30 g 12  . flecainide (TAMBOCOR) 100 MG tablet TAKE 1 TABLET BY MOUTH TWICE A DAY 180 tablet 2  . gabapentin (NEURONTIN) 100 MG capsule Take 2 capsules (200 mg total) by mouth 2 (two) times daily.    Marland Kitchen levothyroxine (SYNTHROID) 25 MCG tablet Take 1 tablet (25 mcg total) by mouth daily. 30 minutes before food 90 tablet 3  . Melatonin ([employer] MELATONIN) 5 MG SUBL Place under the tongue.    . Multiple Vitamin (MULTIVITAMIN) tablet Take 1 tablet by mouth daily.      . pravastatin (PRAVACHOL) 20 MG tablet Take 1 tablet (20 mg total) by mouth at bedtime. 90 tablet 3  . SALT SUBSTITUTES PO Take 1 tablet by mouth.    . timolol (TIMOPTIC) 0.5 % ophthalmic solution Place 1 drop into the left eye daily.     . vitamin B-12 (CYANOCOBALAMIN) 1000 MCG tablet Take 1,000 mcg by mouth daily.    . vitamin C (ASCORBIC ACID) 500 MG tablet Take 500 mg by mouth daily.    . vitamin E 400 UNIT capsule Take 400 Units by mouth daily.       No current facility-administered medications for this visit.    Allergies:   Darvon, Propoxyphene, Propoxyphene n-acetaminophen, and Levofloxacin   Social History:  The patient  reports that she has never smoked. She has never used smokeless tobacco. She reports current alcohol use of about 1.0 standard drinks of alcohol per week. She reports that she does not use drugs.   Family History:  The patient's family history includes Multiple sclerosis in her father.   ROS:  Please see the history of present illness.   All other systems are personally reviewed and  negative.    Exam:    Vital Signs:  BP 95/71   Pulse 78   Ht 5\' 9"  (1.753 m)   Wt 166 lb (75.3 kg)   BMI 24.51 kg/m   Well sounding, alert and conversant   Labs/Other Tests and Data Reviewed:    Recent Labs: 04/14/2019: BUN 19; Hemoglobin 13.1; Platelets 304; Potassium 4.7; Sodium 139 06/26/2019: ALT 19; TSH 1.35 06/27/2019: Creatinine, Ser 0.80   Wt Readings from Last 3 Encounters:  10/11/19 166 lb (75.3 kg)  09/22/19 165 lb (74.8 kg)  08/09/19 171 lb (77.6 kg)     Last device remote is reviewed from [employer] PDF which reveals normal device function,  no arrhythmias other than sinus with PACs   ASSESSMENT & PLAN:    1.  Paroxysmal atrial fibrilllation Doing very well s/p ablation ILR is reviewed and reveals sinus rhythm with PACs. chads2vasc score is 4.  If her AF burden remains low, she would like to consider stopping OAC at some point.  I have advised that we could reduce/ stop flecainide.  She does not wish to make changes today   Follow-up:  6 months with me   Patient Risk:  after full review of this patients clinical status, I feel that they are at moderate risk at this time.  Today, I have spent 15 minutes with the patient with telehealth technology discussing arrhythmia management .    Army Fossa, MD  10/11/2019 10:52 AM     [facility] [address] [place] [address] [phone] (office) [phone] (fax)

## 2019-10-12 ENCOUNTER — Other Ambulatory Visit: Payer: Self-pay

## 2019-10-12 ENCOUNTER — Encounter: Payer: Self-pay | Admitting: Gastroenterology

## 2019-10-12 ENCOUNTER — Ambulatory Visit: Payer: PPO | Admitting: Gastroenterology

## 2019-10-12 VITALS — BP 99/64 | HR 79 | Temp 98.2°F | Ht 69.0 in | Wt 167.6 lb

## 2019-10-12 DIAGNOSIS — K862 Cyst of pancreas: Secondary | ICD-10-CM | POA: Diagnosis not present

## 2019-10-12 NOTE — Telephone Encounter (Signed)
Pt returned your call.  

## 2019-10-12 NOTE — Progress Notes (Signed)
Gastroenterology Consultation  Referring Provider:     McLean-Scocuzza, Olivia Mackie * Primary Care Physician:  McLean-Scocuzza, Nino Glow, MD Primary Gastroenterologist:  Dr. Allen Norris     Reason for Consultation:     Pancreatic cyst        HPI:   Monica Whitaker is a 74 y.o. y/o female referred for consultation & management of pancreatic cyst by Dr. Terese Door, Nino Glow, MD.  This patient comes in today after being seen by [facility] clinic in 2015 for colonoscopy but was recently found to have a CT scan showing pancreatic cyst.  The patient was recommended to undergo a MRI of the pancreas with pancreatic protocol and it showed:  IMPRESSION: 1. Innumerable cystic foci throughout the pancreas, centered in the neck, body, and tail. Probable concurrent main duct dilatation within the upstream body and tail. 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 The sequelae of pancreatitis could look similar. Consider endoscopic ultrasound-guided sampling. 2. No findings to suggest complicating dysplasia or carcinoma. 3.  Aortic Atherosclerosis  The CT scan showed one of the lesions to be 1.8 cm x 3.7 cm but the MRI reported the largest lesion to be 2.1 cm x 2.1 cm The patient denies any symptoms at the present time and denies any weight loss nausea vomiting fevers or chills.  Past Medical History:  Diagnosis Date  . Anxiety    controlled with meds  . Anxiety   . Arrhythmia   . Atrial fibrillation ([name])    paroxysmal, failed medical therapy with flecainide,  NSVT with tikosyn  . Bruises easily   . CVA (cerebral vascular accident) ([place]) 12/2007  . Depression    medically controlled   . Glaucoma    also with macular holes AE Dr. Thomasene Ripple  . Low blood pressure    no falls, but if gets up to fast  . RLS (restless legs syndrome)   . Stroke [facility]) 7 years ago   Lasting effects on balance, different personality.  . Thyroid disease   . Tremor   . Ventricular tachycardia  ([place])    pt told at [facility] that she had RVOT VT    Past Surgical History:  Procedure Laterality Date  . ATRIAL FIBRILLATION ABLATION  10/18/12   PVI by Dr Rayann Heman  . ATRIAL FIBRILLATION ABLATION N/A 10/18/2012   Procedure: ATRIAL FIBRILLATION ABLATION;  Surgeon: Thompson Grayer, MD;  Location: [facility] CATH LAB;  Service: Cardiovascular;  Laterality: N/A;  . ATRIAL FIBRILLATION ABLATION N/A 04/18/2019   Procedure: ATRIAL FIBRILLATION ABLATION;  Surgeon: Thompson Grayer, MD;  Location: [place] CV LAB;  Service: Cardiovascular;  Laterality: N/A;  . EYE SURGERY     on L/R eye, macular hole   . implantable loop recorder placement  07/13/2019   MDT Reveal LINQ1 [facility] [id number]) implanted in office by Dr Rayann Heman for afib management post ablation  . OPEN REDUCTION INTERNAL FIXATION (ORIF) DISTAL RADIAL FRACTURE Right 11/15/2014   Procedure: OPEN REDUCTION INTERNAL FIXATION (ORIF) RIGHT DISTAL RADIAL FRACTURE WITH ALLOGRAFT BONE GRAFT;  Surgeon: Roseanne Kaufman, MD;  Location: [place];  Service: Orthopedics;  Laterality: Right;  . TEE WITHOUT CARDIOVERSION N/A 10/18/2012   Procedure: TRANSESOPHAGEAL ECHOCARDIOGRAM (TEE);  Surgeon: Lelon Perla, MD;  Location: [facility] ENDOSCOPY;  Service: Cardiovascular;  Laterality: N/A;  Pre-Ablation at 12pm  . TONSILLECTOMY    . VAGINAL HYSTERECTOMY      Prior to Admission medications   Medication Sig Start Date End Date Taking? Authorizing Provider  acetaminophen (TYLENOL) 500 MG tablet Take 1,000 mg by mouth every 6 (six) hours as needed (pain). Take 2 tablets by mouth every 6 hours as needed for pain    [provider]  amphetamine-dextroamphetamine (ADDERALL XR) 10 MG 24 hr capsule Take 1 capsule (10 mg total) by mouth daily. 09/05/19   Cottle, Billey Co., MD  calcium carbonate (OS-CAL) 600 MG TABS Take 600 mg by mouth daily with breakfast.     [provider]  cholecalciferol (VITAMIN D) 1000 UNITS tablet Take 2,000 Units by mouth daily.      [provider]  CRANBERRY EXTRACT PO Take by mouth.    [provider]  denosumab (PROLIA) 60 MG/ML SOLN injection Inject 60 mg into the skin every 6 (six) months.     [provider]  diltiazem (CARDIZEM CD) 240 MG 24 hr capsule TAKE 1 CAPSULE (240 MG TOTAL) BY MOUTH DAILY. MAY TAKE EXTRA CAPSULE DAILY AS NEEDED FOR AFIB 07/20/19   Sherran Needs, NP  DULoxetine (CYMBALTA) 60 MG capsule Take 1 capsule (60 mg total) by mouth 2 (two) times daily. 09/05/19   Cottle, Billey Co., MD  ELIQUIS 5 MG TABS tablet TAKE 1 TABLET BY MOUTH TWICE A DAY 06/05/19   Allred, Jeneen Rinks, MD  estradiol (ESTRACE VAGINAL) 0.1 MG/GM vaginal cream Apply 0.5mg  (pea-sized amount)  just inside the vaginal introitus with a finger-tip on Monday, Wednesday and Friday nights. 05/25/18   McGowan, Larene Beach A, PA-C  flecainide (TAMBOCOR) 100 MG tablet TAKE 1 TABLET BY MOUTH TWICE A DAY 05/18/19   Allred, Jeneen Rinks, MD  gabapentin (NEURONTIN) 100 MG capsule Take 2 capsules (200 mg total) by mouth 2 (two) times daily. 09/22/19   McLean-Scocuzza, Nino Glow, MD  levothyroxine (SYNTHROID) 25 MCG tablet Take 1 tablet (25 mcg total) by mouth daily. 30 minutes before food 05/25/19   McLean-Scocuzza, Nino Glow, MD  Melatonin ([employer]S MELATONIN) 5 MG SUBL Place under the tongue.    [provider]  Multiple Vitamin (MULTIVITAMIN) tablet Take 1 tablet by mouth daily.      [provider]  pravastatin (PRAVACHOL) 20 MG tablet Take 1 tablet (20 mg total) by mouth at bedtime. 07/03/19   McLean-Scocuzza, Nino Glow, MD  SALT SUBSTITUTES PO Take 1 tablet by mouth.    [provider]  timolol (TIMOPTIC) 0.5 % ophthalmic solution Place 1 drop into the left eye daily.     [provider]  vitamin B-12 (CYANOCOBALAMIN) 1000 MCG tablet Take 1,000 mcg by mouth daily.    [provider]  vitamin C (ASCORBIC ACID) 500 MG tablet Take 500 mg by mouth daily.    [provider]  vitamin E 400 UNIT  capsule Take 400 Units by mouth daily.      [provider]    Family History  Problem Relation Age of Onset  . Multiple sclerosis Father   . Breast cancer Neg Hx      Social History   Tobacco Use  . Smoking status: Never Smoker  . Smokeless tobacco: Never Used  Substance Use Topics  . Alcohol use: Yes    Alcohol/week: 1.0 standard drinks    Types: 1 Standard drinks or equivalent per week    Comment: regular  . Drug use: No    Allergies as of 10/12/2019 - Review Complete 09/22/2019  Allergen Reaction Noted  . Darvon Other (See Comments) 05/20/2011  . Propoxyphene Other (See Comments) 05/30/2014  . Propoxyphene n-acetaminophen Other (See  Comments) 03/11/2010  . Levofloxacin Anxiety and Other (See Comments) 03/11/2010    Review of Systems:    All systems reviewed and negative except where noted in HPI.   Physical Exam:  There were no vitals taken for this visit. No LMP recorded. Patient has had a hysterectomy. General:   Alert,  Well-developed, well-nourished, pleasant and cooperative in NAD Head:  Normocephalic and atraumatic. Eyes:  Sclera clear, no icterus.   Conjunctiva pink. Ears:  Normal auditory acuity. Neck:  Supple; no masses or thyromegaly. Lungs:  Respirations even and unlabored.  Clear throughout to auscultation.   No wheezes, crackles, or rhonchi. No acute distress. Heart:  Regular rate and rhythm; no murmurs, clicks, rubs, or gallops. Abdomen:  Normal bowel sounds.  No bruits.  Soft, non-tender and non-distended without masses, hepatosplenomegaly or hernias noted.  No guarding or rebound tenderness.  Negative Carnett sign.   Rectal:  Deferred.  Pulses:  Normal pulses noted. Extremities:  No clubbing or edema.  No cyanosis. Neurologic:  Alert and oriented x3;  grossly normal neurologically. Skin:  Intact without significant lesions or rashes.  No jaundice. Lymph Nodes:  No significant cervical adenopathy. Psych:  Alert and cooperative. Normal mood  and affect.  Imaging Studies: CUP PACEART REMOTE DEVICE CHECK  Result Date: 09/18/2019 Carelink summary report received. Battery status OK. Normal device function. No new symptom episodes, tachy episodes, brady, or pause episodes. No new AF episodes. Previous events reviewed as false. Monthly summary reports and ROV/PRN [place]   Assessment and Plan:   Monica Whitaker is a 74 y.o. y/o female who comes in today with what looks like Mixed IPMN's.  The patient has been told the risk of these lesions turning into cancer.  The patient has also been told that the size of the lesions need to be ascertained since there is discrepancy between the CT scan and the MRI.  Fluid sampling is also recommended to check the patient's fluid CEA level.  The patient will be set up for an endoscopic ultrasound with more accurate measurement of the size of the cyst and looking for mural nodule and possible sampling of the fluid for analysis and cytology.  The patient has been told that this lesion may need surgical resection versus close monitoring depending on the results of the endoscopic ultrasound.  The patient has been explained the plan and agrees with it.    Lucilla Lame, MD. Marval Regal    Note: This dictation was prepared with Dragon dictation along with smaller phrase technology. Any transcriptional errors that result from this process are unintentional.

## 2019-10-12 NOTE — H&P (View-Only) (Signed)
Gastroenterology Consultation  Referring Provider:     McLean-Scocuzza, Olivia Mackie * Primary Care Physician:  McLean-Scocuzza, Nino Glow, MD Primary Gastroenterologist:  Dr. Allen Norris     Reason for Consultation:     Pancreatic cyst        HPI:   Monica Whitaker is a 74 y.o. y/o female referred for consultation & management of pancreatic cyst by Dr. Terese Door, Nino Glow, MD.  This patient comes in today after being seen by [facility] clinic in 2015 for colonoscopy but was recently found to have a CT scan showing pancreatic cyst.  The patient was recommended to undergo a MRI of the pancreas with pancreatic protocol and it showed:  IMPRESSION: 1. Innumerable cystic foci throughout the pancreas, centered in the neck, body, and tail. Probable concurrent main duct dilatation within the upstream body and tail. 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 The sequelae of pancreatitis could look similar. Consider endoscopic ultrasound-guided sampling. 2. No findings to suggest complicating dysplasia or carcinoma. 3.  Aortic Atherosclerosis  The CT scan showed one of the lesions to be 1.8 cm x 3.7 cm but the MRI reported the largest lesion to be 2.1 cm x 2.1 cm The patient denies any symptoms at the present time and denies any weight loss nausea vomiting fevers or chills.  Past Medical History:  Diagnosis Date  . Anxiety    controlled with meds  . Anxiety   . Arrhythmia   . Atrial fibrillation ([name])    paroxysmal, failed medical therapy with flecainide,  NSVT with tikosyn  . Bruises easily   . CVA (cerebral vascular accident) ([place]) 12/2007  . Depression    medically controlled   . Glaucoma    also with macular holes AE Dr. Thomasene Ripple  . Low blood pressure    no falls, but if gets up to fast  . RLS (restless legs syndrome)   . Stroke [facility]) 7 years ago   Lasting effects on balance, different personality.  . Thyroid disease   . Tremor   . Ventricular tachycardia  ([place])    pt told at [facility] that she had RVOT VT    Past Surgical History:  Procedure Laterality Date  . ATRIAL FIBRILLATION ABLATION  10/18/12   PVI by Dr Rayann Heman  . ATRIAL FIBRILLATION ABLATION N/A 10/18/2012   Procedure: ATRIAL FIBRILLATION ABLATION;  Surgeon: Thompson Grayer, MD;  Location: [facility] CATH LAB;  Service: Cardiovascular;  Laterality: N/A;  . ATRIAL FIBRILLATION ABLATION N/A 04/18/2019   Procedure: ATRIAL FIBRILLATION ABLATION;  Surgeon: Thompson Grayer, MD;  Location: [place] CV LAB;  Service: Cardiovascular;  Laterality: N/A;  . EYE SURGERY     on L/R eye, macular hole   . implantable loop recorder placement  07/13/2019   MDT Reveal LINQ1 [facility] [id number]) implanted in office by Dr Rayann Heman for afib management post ablation  . OPEN REDUCTION INTERNAL FIXATION (ORIF) DISTAL RADIAL FRACTURE Right 11/15/2014   Procedure: OPEN REDUCTION INTERNAL FIXATION (ORIF) RIGHT DISTAL RADIAL FRACTURE WITH ALLOGRAFT BONE GRAFT;  Surgeon: Roseanne Kaufman, MD;  Location: [place];  Service: Orthopedics;  Laterality: Right;  . TEE WITHOUT CARDIOVERSION N/A 10/18/2012   Procedure: TRANSESOPHAGEAL ECHOCARDIOGRAM (TEE);  Surgeon: Lelon Perla, MD;  Location: [facility] ENDOSCOPY;  Service: Cardiovascular;  Laterality: N/A;  Pre-Ablation at 12pm  . TONSILLECTOMY    . VAGINAL HYSTERECTOMY      Prior to Admission medications   Medication Sig Start Date End Date Taking? Authorizing Provider  acetaminophen (TYLENOL) 500 MG tablet Take 1,000 mg by mouth every 6 (six) hours as needed (pain). Take 2 tablets by mouth every 6 hours as needed for pain    [provider]  amphetamine-dextroamphetamine (ADDERALL XR) 10 MG 24 hr capsule Take 1 capsule (10 mg total) by mouth daily. 09/05/19   Cottle, Billey Co., MD  calcium carbonate (OS-CAL) 600 MG TABS Take 600 mg by mouth daily with breakfast.     [provider]  cholecalciferol (VITAMIN D) 1000 UNITS tablet Take 2,000 Units by mouth daily.      [provider]  CRANBERRY EXTRACT PO Take by mouth.    [provider]  denosumab (PROLIA) 60 MG/ML SOLN injection Inject 60 mg into the skin every 6 (six) months.     [provider]  diltiazem (CARDIZEM CD) 240 MG 24 hr capsule TAKE 1 CAPSULE (240 MG TOTAL) BY MOUTH DAILY. MAY TAKE EXTRA CAPSULE DAILY AS NEEDED FOR AFIB 07/20/19   Sherran Needs, NP  DULoxetine (CYMBALTA) 60 MG capsule Take 1 capsule (60 mg total) by mouth 2 (two) times daily. 09/05/19   Cottle, Billey Co., MD  ELIQUIS 5 MG TABS tablet TAKE 1 TABLET BY MOUTH TWICE A DAY 06/05/19   Allred, Jeneen Rinks, MD  estradiol (ESTRACE VAGINAL) 0.1 MG/GM vaginal cream Apply 0.5mg  (pea-sized amount)  just inside the vaginal introitus with a finger-tip on Monday, Wednesday and Friday nights. 05/25/18   McGowan, Larene Beach A, PA-C  flecainide (TAMBOCOR) 100 MG tablet TAKE 1 TABLET BY MOUTH TWICE A DAY 05/18/19   Allred, Jeneen Rinks, MD  gabapentin (NEURONTIN) 100 MG capsule Take 2 capsules (200 mg total) by mouth 2 (two) times daily. 09/22/19   McLean-Scocuzza, Nino Glow, MD  levothyroxine (SYNTHROID) 25 MCG tablet Take 1 tablet (25 mcg total) by mouth daily. 30 minutes before food 05/25/19   McLean-Scocuzza, Nino Glow, MD  Melatonin ([employer] MELATONIN) 5 MG SUBL Place under the tongue.    [provider]  Multiple Vitamin (MULTIVITAMIN) tablet Take 1 tablet by mouth daily.      [provider]  pravastatin (PRAVACHOL) 20 MG tablet Take 1 tablet (20 mg total) by mouth at bedtime. 07/03/19   McLean-Scocuzza, Nino Glow, MD  SALT SUBSTITUTES PO Take 1 tablet by mouth.    [provider]  timolol (TIMOPTIC) 0.5 % ophthalmic solution Place 1 drop into the left eye daily.     [provider]  vitamin B-12 (CYANOCOBALAMIN) 1000 MCG tablet Take 1,000 mcg by mouth daily.    [provider]  vitamin C (ASCORBIC ACID) 500 MG tablet Take 500 mg by mouth daily.    [provider]  vitamin E 400 UNIT  capsule Take 400 Units by mouth daily.      [provider]    Family History  Problem Relation Age of Onset  . Multiple sclerosis Father   . Breast cancer Neg Hx      Social History   Tobacco Use  . Smoking status: Never Smoker  . Smokeless tobacco: Never Used  Substance Use Topics  . Alcohol use: Yes    Alcohol/week: 1.0 standard drinks    Types: 1 Standard drinks or equivalent per week    Comment: regular  . Drug use: No    Allergies as of 10/12/2019 - Review Complete 09/22/2019  Allergen Reaction Noted  . Darvon Other (See Comments) 05/20/2011  . Propoxyphene Other (See Comments) 05/30/2014  . Propoxyphene n-acetaminophen Other (See  Comments) 03/11/2010  . Levofloxacin Anxiety and Other (See Comments) 03/11/2010    Review of Systems:    All systems reviewed and negative except where noted in HPI.   Physical Exam:  There were no vitals taken for this visit. No LMP recorded. Patient has had a hysterectomy. General:   Alert,  Well-developed, well-nourished, pleasant and cooperative in NAD Head:  Normocephalic and atraumatic. Eyes:  Sclera clear, no icterus.   Conjunctiva pink. Ears:  Normal auditory acuity. Neck:  Supple; no masses or thyromegaly. Lungs:  Respirations even and unlabored.  Clear throughout to auscultation.   No wheezes, crackles, or rhonchi. No acute distress. Heart:  Regular rate and rhythm; no murmurs, clicks, rubs, or gallops. Abdomen:  Normal bowel sounds.  No bruits.  Soft, non-tender and non-distended without masses, hepatosplenomegaly or hernias noted.  No guarding or rebound tenderness.  Negative Carnett sign.   Rectal:  Deferred.  Pulses:  Normal pulses noted. Extremities:  No clubbing or edema.  No cyanosis. Neurologic:  Alert and oriented x3;  grossly normal neurologically. Skin:  Intact without significant lesions or rashes.  No jaundice. Lymph Nodes:  No significant cervical adenopathy. Psych:  Alert and cooperative. Normal mood  and affect.  Imaging Studies: CUP PACEART REMOTE DEVICE CHECK  Result Date: 09/18/2019 Carelink summary report received. Battery status OK. Normal device function. No new symptom episodes, tachy episodes, brady, or pause episodes. No new AF episodes. Previous events reviewed as false. Monthly summary reports and ROV/PRN [employer]   Assessment and Plan:   Monica Whitaker is a 74 y.o. y/o female who comes in today with what looks like Mixed IPMN's.  The patient has been told the risk of these lesions turning into cancer.  The patient has also been told that the size of the lesions need to be ascertained since there is discrepancy between the CT scan and the MRI.  Fluid sampling is also recommended to check the patient's fluid CEA level.  The patient will be set up for an endoscopic ultrasound with more accurate measurement of the size of the cyst and looking for mural nodule and possible sampling of the fluid for analysis and cytology.  The patient has been told that this lesion may need surgical resection versus close monitoring depending on the results of the endoscopic ultrasound.  The patient has been explained the plan and agrees with it.    Lucilla Lame, MD. Marval Regal    Note: This dictation was prepared with Dragon dictation along with smaller phrase technology. Any transcriptional errors that result from this process are unintentional.

## 2019-10-13 ENCOUNTER — Other Ambulatory Visit: Payer: Self-pay

## 2019-10-13 ENCOUNTER — Telehealth: Payer: Self-pay

## 2019-10-13 ENCOUNTER — Telehealth: Payer: Self-pay | Admitting: Internal Medicine

## 2019-10-13 NOTE — Telephone Encounter (Signed)
Filed: 09/26/2019 9:49 AM Encounter Date: 09/22/2019 Status: Signed  Editor: Nanci Pina, RN (Registered Nurse)     Verification completed 06/23/19 not due for reverification until 06/22/20

## 2019-10-13 NOTE — Telephone Encounter (Signed)
-----   Message from Delorise Jackson, MD sent at 09/26/2019  9:53 AM EST ----- Sch prolia 12/24/19   Thanks [place] ----- Message ----- From: Nanci Pina, RN Sent: 09/26/2019   9:48 AM EST To: Nino Glow McLean-Scocuzza, MD    ----- Message ----- From: McLean-Scocuzza, Nino Glow, MD Sent: 09/22/2019   3:25 PM EST To: Nanci Pina, RN  Prolia due 12/24/19 please approve with insurance

## 2019-10-13 NOTE — Telephone Encounter (Signed)
Referral received from Dr. Allen Norris for EUS to evaluate pancreatic cysts. Case has been reviewed by Dr. Cephas Darby and we are scheduling EUS for 11/02/19 at [facility] over instructions and copy will be mailed to home address. I have reached out to Dr. Rayann Heman to obtain permission to hold Eliquis.

## 2019-10-16 ENCOUNTER — Telehealth: Payer: Self-pay | Admitting: Psychiatry

## 2019-10-16 ENCOUNTER — Telehealth: Payer: Self-pay

## 2019-10-16 ENCOUNTER — Encounter: Payer: Self-pay | Admitting: Internal Medicine

## 2019-10-16 ENCOUNTER — Other Ambulatory Visit: Payer: Self-pay | Admitting: Psychiatry

## 2019-10-16 ENCOUNTER — Telehealth: Payer: Self-pay | Admitting: Internal Medicine

## 2019-10-16 MED ORDER — LORAZEPAM 0.5 MG PO TABS: 0.5000 mg | ORAL_TABLET | Freq: Three times a day (TID) | ORAL | 0 refills | Status: DC

## 2019-10-16 NOTE — Telephone Encounter (Signed)
Give her my best wishes. Patient more anxious because of facing test for possible pancreatic cancer.  Asking for something for anxiety.  Will prescribe lorazepam 0.5 mg 3 times daily temporarily.  Prescription sent

## 2019-10-16 NOTE — Progress Notes (Signed)
Patient more anxious because of facing test for possible pancreatic cancer.  Asking for something for anxiety.  Will prescribe lorazepam 0.5 mg 3 times daily temporarily.  Prescription sent

## 2019-10-16 NOTE — Progress Notes (Signed)
We have had a cancellation in the EUS schedule. I have reached out to Monica Whitaker and she is now scheduled for her EUS 3/18. Went over all instructions and they can be viewed under letters. A copy of letters released to my chart. She will hold her Eliquis on March 16 and March 17. Dr. Mont Dutton will tell her when to resume. Voicemail left with PAT to schedule for her COVID test 3/16 between 1030-1230. Encouraged her to call with any questions or concerns.

## 2019-10-16 NOTE — Telephone Encounter (Signed)
We had a cancellation in the EUS schedule. Monica Whitaker has been rescheduled for her EUS 10/19/19. We reviewed instructions and they can be viewed under the letters tab. A copy of instructions were also released to her my chart. She will hold her Eliquis March 16 and March 17. Dr. Mont Dutton will let her know when to resume. Voicemail left with APT to schedule COVID test 3/16 between 1030-1230. Encouraged Monica. Thrall to call with any questions or concerns.  Thompson Grayer, MD  Clent Jacks, RN  Ok to hold eliquis as you have stated  Ok to proceed with surgery without any further cardiac testing       Previous Messages   ----- Message -----  From: Clent Jacks, RN  Sent: 10/13/2019 12:31 PM EST  To: Thompson Grayer, MD  Subject: Eliquis hold                   Hi Dr. Rayann Heman, I am arranging an endoscopic ultrasound to evaluate and biopsy pancreatic cysts found on CT/MRI. I will need permission to hold her Eliquis for 48 hours prior and make sure that from a cardiac standpoint she is cleared. We are planning biopsy for April 1st. Thank you.   Mariea Clonts

## 2019-10-16 NOTE — Telephone Encounter (Signed)
Having a test done this week for pancreatic cancer. She is a nervous wreck and is asking if there is a med she can have for anxiety and nervousness.

## 2019-10-16 NOTE — Telephone Encounter (Signed)
prolia injection due 12/24/19  Is she approved for 2021 for prolia Monica Whitaker ?   If so please schedule prolia injection  TSH checked 06/2019  I did not call patient today   If she does not wish to see out of network provider in the future she needs to    check which doctors are in her network before a referral and let me know    Trouble with anxiety and sleep rec she call her psychiatrist    tMS

## 2019-10-17 ENCOUNTER — Other Ambulatory Visit
Admission: RE | Admit: 2019-10-17 | Discharge: 2019-10-17 | Disposition: A | Discharge status: Home / Self Care | Payer: PPO | Source: Ambulatory Visit | Attending: Internal Medicine | Admitting: Internal Medicine

## 2019-10-17 DIAGNOSIS — Z01812 Encounter for preprocedural laboratory examination: Secondary | ICD-10-CM | POA: Diagnosis not present

## 2019-10-17 DIAGNOSIS — Z20822 Contact with and (suspected) exposure to covid-19: Secondary | ICD-10-CM | POA: Insufficient documentation

## 2019-10-17 LAB — SARS CORONAVIRUS 2 (TAT 6-24 HRS): SARS Coronavirus 2: NEGATIVE

## 2019-10-17 NOTE — Telephone Encounter (Signed)
Left patient detailed information on her voicemail and to call back with further concerns.

## 2019-10-18 ENCOUNTER — Encounter: Payer: Self-pay | Admitting: Internal Medicine

## 2019-10-18 ENCOUNTER — Telehealth: Payer: Self-pay

## 2019-10-18 NOTE — Telephone Encounter (Signed)
Left message for patient to remind of missed remote transmission.  

## 2019-10-19 ENCOUNTER — Ambulatory Visit: Payer: PPO | Admitting: Certified Registered Nurse Anesthetist

## 2019-10-19 ENCOUNTER — Encounter
Admission: RE | Disposition: A | Discharge status: Home / Self Care | Payer: Self-pay | Source: Home / Self Care | Attending: Internal Medicine

## 2019-10-19 ENCOUNTER — Ambulatory Visit
Admission: RE | Admit: 2019-10-19 | Discharge: 2019-10-19 | Disposition: A | Discharge status: Home / Self Care | Payer: PPO | Attending: Internal Medicine | Admitting: Internal Medicine

## 2019-10-19 ENCOUNTER — Other Ambulatory Visit: Payer: Self-pay

## 2019-10-19 ENCOUNTER — Encounter: Payer: Self-pay | Admitting: Internal Medicine

## 2019-10-19 DIAGNOSIS — E039 Hypothyroidism, unspecified: Secondary | ICD-10-CM | POA: Diagnosis not present

## 2019-10-19 DIAGNOSIS — Z79899 Other long term (current) drug therapy: Secondary | ICD-10-CM | POA: Insufficient documentation

## 2019-10-19 DIAGNOSIS — F419 Anxiety disorder, unspecified: Secondary | ICD-10-CM | POA: Diagnosis not present

## 2019-10-19 DIAGNOSIS — K449 Diaphragmatic hernia without obstruction or gangrene: Secondary | ICD-10-CM | POA: Diagnosis not present

## 2019-10-19 DIAGNOSIS — I480 Paroxysmal atrial fibrillation: Secondary | ICD-10-CM | POA: Insufficient documentation

## 2019-10-19 DIAGNOSIS — F329 Major depressive disorder, single episode, unspecified: Secondary | ICD-10-CM | POA: Insufficient documentation

## 2019-10-19 DIAGNOSIS — D136 Benign neoplasm of pancreas: Secondary | ICD-10-CM | POA: Diagnosis not present

## 2019-10-19 DIAGNOSIS — G4733 Obstructive sleep apnea (adult) (pediatric): Secondary | ICD-10-CM | POA: Diagnosis not present

## 2019-10-19 DIAGNOSIS — Z8673 Personal history of transient ischemic attack (TIA), and cerebral infarction without residual deficits: Secondary | ICD-10-CM | POA: Insufficient documentation

## 2019-10-19 DIAGNOSIS — Z881 Allergy status to other antibiotic agents status: Secondary | ICD-10-CM | POA: Insufficient documentation

## 2019-10-19 DIAGNOSIS — I700 Atherosclerosis of aorta: Secondary | ICD-10-CM | POA: Insufficient documentation

## 2019-10-19 DIAGNOSIS — H409 Unspecified glaucoma: Secondary | ICD-10-CM | POA: Insufficient documentation

## 2019-10-19 DIAGNOSIS — Z7901 Long term (current) use of anticoagulants: Secondary | ICD-10-CM | POA: Insufficient documentation

## 2019-10-19 DIAGNOSIS — Z885 Allergy status to narcotic agent status: Secondary | ICD-10-CM | POA: Diagnosis not present

## 2019-10-19 DIAGNOSIS — F418 Other specified anxiety disorders: Secondary | ICD-10-CM | POA: Diagnosis not present

## 2019-10-19 DIAGNOSIS — K862 Cyst of pancreas: Secondary | ICD-10-CM | POA: Diagnosis not present

## 2019-10-19 HISTORY — PX: EUS: SHX5427

## 2019-10-19 SURGERY — ULTRASOUND, UPPER GI TRACT, ENDOSCOPIC
Anesthesia: General

## 2019-10-19 MED ORDER — LIDOCAINE HCL (CARDIAC) PF 100 MG/5ML IV SOSY
PREFILLED_SYRINGE | INTRAVENOUS | Status: DC | PRN
  Administered 2019-10-19: 50 mg via INTRAVENOUS

## 2019-10-19 MED ORDER — PROPOFOL 500 MG/50ML IV EMUL
INTRAVENOUS | Status: DC | PRN
  Administered 2019-10-19: 150 ug/kg/min via INTRAVENOUS

## 2019-10-19 MED ORDER — CIPROFLOXACIN IN D5W 400 MG/200ML IV SOLN
400.0000 mg | Freq: Once | INTRAVENOUS | Status: AC
  Administered 2019-10-19: 400 mg via INTRAVENOUS

## 2019-10-19 MED ORDER — SODIUM CHLORIDE 0.9 % IV SOLN
INTRAVENOUS | Status: DC
  Administered 2019-10-19: 13:00:00 1000 mL via INTRAVENOUS

## 2019-10-19 MED ORDER — CIPROFLOXACIN IN D5W 400 MG/200ML IV SOLN
INTRAVENOUS | Status: AC
  Filled 2019-10-19: qty 200

## 2019-10-19 MED ORDER — ONDANSETRON HCL 4 MG/2ML IJ SOLN
INTRAMUSCULAR | Status: DC | PRN
  Administered 2019-10-19: 4 mg via INTRAVENOUS

## 2019-10-19 NOTE — Op Note (Signed)
[facility] Gastroenterology Patient Name: Monica Whitaker Procedure Date: 10/19/2019 12:50 PM MRN: [id number] Account #: [id number] Date of Birth: 07/24/1946 Admit Type: Outpatient Age: 74 Room: [facility] ENDO ROOM 3 Gender: Female Note Status: Finalized Procedure:             Upper EUS Indications:           Pancreatic cyst on CT scan Patient Profile:       Refer to note in patient chart for documentation of                         history and physical. Providers:             Murray Hodgkins. Franciszek Platten Referring MD:          Nino Glow Mclean-Scocuzza MD, MD (Referring MD), Lucilla Lame MD, MD (Referring MD) Medicines:             Propofol per Anesthesia, Cipro 419 mg IV Complications:         No immediate complications. Procedure:             Pre-Anesthesia Assessment:                        Prior to the procedure, a History and Physical was                         performed, and patient medications and allergies were                         reviewed. The patient is competent. The risks and                         benefits of the procedure and the sedation options and                         risks were discussed with the patient. All questions                         were answered and informed consent was obtained.                         Patient identification and proposed procedure were                         verified by the physician, the nurse and the                         anesthetist in the pre-procedure area. Mental Status                         Examination: alert and oriented. Airway Examination:                         normal oropharyngeal airway and neck mobility.                         Respiratory Examination: clear to auscultation. CV  Examination: normal. Prophylactic Antibiotics: The                         patient requires prophylactic antibiotics. Prior                         Anticoagulants: The patient has  taken Eliquis                         (apixaban), last dose was 3 days prior to procedure.                         ASA Grade Assessment: III - A patient with severe                         systemic disease. After reviewing the risks and                         benefits, the patient was deemed in satisfactory                         condition to undergo the procedure. The anesthesia                         plan was to use monitored anesthesia care (MAC).                         Immediately prior to administration of medications,                         the patient was re-assessed for adequacy to receive                         sedatives. The heart rate, respiratory rate, oxygen                         saturations, blood pressure, adequacy of pulmonary                         ventilation, and response to care were monitored                         throughout the procedure. The physical status of the                         patient was re-assessed after the procedure.                        After obtaining informed consent, the endoscope was                         passed under direct vision. Throughout the procedure,                         the patient's blood pressure, pulse, and oxygen                         saturations were monitored continuously. The Endoscope  was introduced through the mouth, and advanced to the                         second part of duodenum. The was introduced through                         the mouth, and advanced to the stomach for ultrasound                         examination. The upper EUS was accomplished without                         difficulty. The patient tolerated the procedure well. Findings:      ENDOSCOPIC FINDING: :      The examined esophagus was endoscopically normal.      A medium-sized hiatal hernia was present.      The entire examined stomach was endoscopically normal.      The examined duodenum was endoscopically  normal.      ENDOSONOGRAPHIC FINDING: :      A multicompartmental anechoic lesion suggestive of a multicystic was       identified in the pancreatic body. It communicates with the pancreatic       duct. The lesion measured 34.4 mm by 25.8 mm in maximal cross-sectional       diameter. There were many compartments thinly septated. The outer wall       of the lesion was not seen. There was no associated mass. There was no       internal debris within the fluid-filled cavity. Diagnostic needle       aspiration for fluid was performed. Color Doppler imaging was utilized       prior to needle puncture to confirm a lack of significant vascular       structures within the needle path. One pass was made with the 22 gauge       [employer] fine aspiration needle using a transgastric approach.       A stylet was used. The amount of fluid collected was 4 mL. The fluid was       serosanguinous and slightly viscous. Sample(s) were sent for amylase       concentration, cytology and CEA.      Anechoic lesions suggestive of multiple cysts were identified in the       pancreatic head and pancreatic tail. The largest lesion measured 10 mm       by 8 mm in maximal cross-sectional diameter. There was no associated       mass.      The pancreatic duct had a dilated endosonographic appearance in the tail       of the pancreas. The pancreatic duct measured up to 8.5 mm in diameter.       The PD was normal in caliber in the head (2.0 mm), neck (2.0 mm) and       body (0.9 mm). No mass seen at the site of duct caliber change.      There was no sign of significant endosonographic abnormality in the       common bile duct. The maximum diameter of the duct was 4 mm.      No lymphadenopathy seen.      Endosonographic imaging in the left lobe of the liver showed no  abnormalities.      The celiac region was visualized and showed no sign of significant       endosonographic abnormality. Impression:             EGD Impressions:                        - Normal esophagus.                        - Medium-sized hiatal hernia.                        - Normal stomach.                        - Normal examined duodenum.                        EUS Impressions:                        - A dominant multicystic lesion was seen in the                         pancreatic body. Taken into account with all the                         pancreatic findings, the endosonographic appearance is                         highly suspicious for a mixed intraductal papillary                         mucinous neoplasm. Fine needle aspiration for cytology                         and cyst fluid analysis (CEA/amylase) performed.                        - Multiple other cystic lesions were seen in the                         pancreatic head and pancreatic tail. Tissue has not                         been obtained. However, the endosonographic appearance                         is suggestive of a mixed intraductal papillary                         mucinous neoplasm.                        - The pancreatic duct had a dilated endosonographic                         appearance in the tail of the pancreas. The pancreatic                         duct measured up to  8 mm in diameter. The duct was                         more normal throughout the pancreas. No obvious mass                         seen at site of caliber change.                        - There was no sign of significant pathology in the                         common bile duct.                        - No lymphadenopathy visualized.                        - Normal visualized portions of the liver.                        - Normal celiac region. Recommendation:        - Discharge patient to home (ambulatory).                        - Await cytology results.                        - Cipro (ciprofloxacin) 500 mg PO BID for 5 days.                        - May resume Eliquis  on _0 238, Esophagogastroduodenoscopy, flexible,                         transoral; with transendoscopic ultrasound-guided  intramural or transmural fine needle                         aspiration/biopsy(s), (includes endoscopic ultrasound                         examination limited to the esophagus, stomach or                         duodenum, and adjacent structures) Diagnosis Code(s):     --- Professional ---                        R93.3, Abnormal findings on diagnostic imaging of                         other parts of digestive tract                        K86.2, Cyst of pancreas                        K44.9, Diaphragmatic hernia without obstruction or                         gangrene CPT copyright 2019 American Medical Association. All rights reserved. The codes documented in this report are preliminary and upon coder review may  be revised to meet current compliance requirements. Attending Participation:      I personally performed the entire procedure without the assistance of a       fellow, resident or surgical assistant. [place],  10/19/2019 1:58:20 PM This report has been signed electronically. Number of Addenda: 0 Note Initiated On: 10/19/2019 12:50 PM Estimated Blood Loss:  Estimated blood loss: none.      [facility]

## 2019-10-19 NOTE — Anesthesia Preprocedure Evaluation (Signed)
Anesthesia Evaluation  Patient identified by MRN, date of birth, ID band Patient awake    Reviewed: Allergy & Precautions, H&P , NPO status , Patient's Chart, lab work & pertinent test results  History of Anesthesia Complications Negative for: history of anesthetic complications  Airway Mallampati: III  TM Distance: <3 FB Neck ROM: limited    Dental  (+) Chipped   Pulmonary neg shortness of breath, sleep apnea ,           Cardiovascular Exercise Tolerance: Good (-) angina(-) Past MI and (-) DOE negative cardio ROS       Neuro/Psych PSYCHIATRIC DISORDERS CVA (patient reports right side and legs), Residual Symptoms    GI/Hepatic negative GI ROS, Neg liver ROS, neg GERD  ,  Endo/Other  Hypothyroidism   Renal/GU negative Renal ROS  negative genitourinary   Musculoskeletal   Abdominal   Peds  Hematology negative hematology ROS (+)   Anesthesia Other Findings Past Medical History: No date: Anxiety     Comment:  controlled with meds No date: Anxiety No date: Arrhythmia No date: Atrial fibrillation [facility])     Comment:  paroxysmal, failed medical therapy with flecainide,                NSVT with tikosyn No date: Bruises easily 12/2007: CVA (cerebral vascular accident) ([place]) No date: Depression     Comment:  medically controlled  No date: Glaucoma     Comment:  also with macular holes AE Dr. Thomasene Ripple No date: Low blood pressure     Comment:  no falls, but if gets up to fast No date: RLS (restless legs syndrome) 7 years ago: Stroke [facility])     Comment:  Lasting effects on balance, different personality. 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  Past Surgical History: 10/18/12: ATRIAL FIBRILLATION ABLATION     Comment:  PVI by Dr Rayann Heman 10/18/2012: ATRIAL FIBRILLATION ABLATION; N/A     Comment:  Procedure: ATRIAL  FIBRILLATION ABLATION;  Surgeon: Thompson Grayer, MD;  Location: [facility] CATH LAB;  Service:               Cardiovascular;  Laterality: N/A; 04/18/2019: ATRIAL FIBRILLATION ABLATION; N/A     Comment:  Procedure: ATRIAL FIBRILLATION ABLATION;  Surgeon:               Thompson Grayer, MD;  Location: [facility] CV LAB;                Service: Cardiovascular;  Laterality: N/A; No date: EYE SURGERY     Comment:  on L/R eye, macular hole  No date: FRACTURE SURGERY 07/13/2019: implantable loop recorder placement     Comment:  MDT Reveal [id number] [facility] [id number]) implanted in office by               Dr Rayann Heman for afib management post ablation 11/15/2014: OPEN REDUCTION INTERNAL FIXATION (ORIF) DISTAL RADIAL  FRACTURE; Right     Comment:  Procedure: OPEN REDUCTION INTERNAL FIXATION (ORIF) RIGHT              DISTAL RADIAL FRACTURE WITH ALLOGRAFT BONE GRAFT;                Surgeon: Roseanne Kaufman, MD;  Location: [facility];  Service: Orthopedics;  Laterality:               Right; 10/18/2012: TEE WITHOUT CARDIOVERSION; N/A     Comment:  Procedure: TRANSESOPHAGEAL ECHOCARDIOGRAM (TEE);                Surgeon: Lelon Perla, MD;  Location: [facility] ENDOSCOPY;                Service: Cardiovascular;  Laterality: N/A;  Pre-Ablation               at 12pm No date: TONSILLECTOMY No date: VAGINAL HYSTERECTOMY     Reproductive/Obstetrics negative OB ROS                             Anesthesia Physical Anesthesia Plan  ASA: III  Anesthesia Plan: General   Post-op Pain Management:    Induction: Intravenous  PONV Risk Score and Plan: Propofol infusion and TIVA  Airway Management Planned: Natural Airway and Nasal Cannula  Additional Equipment:   Intra-op Plan:   Post-operative Plan:   Informed Consent: I have reviewed the patients History and Physical, chart, labs and discussed the procedure including the risks, benefits and alternatives for  the proposed anesthesia with the patient or authorized representative who has indicated his/her understanding and acceptance.     Dental Advisory Given  Plan Discussed with: Anesthesiologist, CRNA and Surgeon  Anesthesia Plan Comments: (Patient consented for risks of anesthesia including but not limited to:  - adverse reactions to medications - risk of intubation if required - damage to teeth, lips or other oral mucosa - sore throat or hoarseness - Damage to heart, brain, lungs or loss of life  Patient voiced understanding.)        Anesthesia Quick Evaluation

## 2019-10-19 NOTE — Interval H&P Note (Signed)
History and Physical Interval Note:  10/19/2019 12:57 PM  Monica Whitaker  has presented today for surgery, with the diagnosis of pancreatic cysts.  The various methods of treatment have been discussed with the patient and family. After consideration of risks, benefits and other options for treatment, the patient has consented to  Procedure(s): FULL UPPER ENDOSCOPIC ULTRASOUND (EUS) RADIAL (N/A) as a surgical intervention.  The patient's history has been reviewed, patient examined, no change in status, stable for surgery.  I have reviewed the patient's chart and labs.  Questions were answered to the patient's satisfaction.     Tillie Rung

## 2019-10-19 NOTE — Brief Op Note (Signed)
FNA of pancreas body cysts sent with [facility] techs present at procedure.  Pancreas body cysts fluid sent to [facility] lab for CEA and Amylase.

## 2019-10-19 NOTE — Transfer of Care (Signed)
Immediate Anesthesia Transfer of Care Note  Patient: Monica Whitaker  Procedure(s) Performed: FULL UPPER ENDOSCOPIC ULTRASOUND (EUS) RADIAL (N/A )  Patient Location: PACU  Anesthesia Type:General  Level of Consciousness: awake, alert  and oriented  Airway & Oxygen Therapy: Patient Spontanous Breathing and Patient connected to face mask oxygen  Post-op Assessment: Report given to RN and Post -op Vital signs reviewed and stable  Post vital signs: Reviewed and stable  Last Vitals:  Vitals Value Taken Time  BP    Temp    Pulse    Resp    SpO2      Last Pain:  Vitals:   10/19/19 1250  TempSrc: Temporal      Patients Stated Pain Goal: 0 ([id number])  Complications: No apparent anesthesia complications

## 2019-10-19 NOTE — Discharge Instructions (Signed)
Discharge to home °

## 2019-10-20 ENCOUNTER — Encounter: Payer: Self-pay | Admitting: *Deleted

## 2019-10-20 ENCOUNTER — Encounter: Payer: Self-pay | Admitting: Internal Medicine

## 2019-10-20 LAB — AMYLASE, BODY FLUID (OTHER): Amylase, Body Fluid: 5764 U/L

## 2019-10-20 LAB — CYTOLOGY - NON PAP

## 2019-10-20 NOTE — Anesthesia Postprocedure Evaluation (Signed)
Anesthesia Post Note  Patient: SHENELL LEITH  Procedure(s) Performed: FULL UPPER ENDOSCOPIC ULTRASOUND (EUS) RADIAL (N/A )  Patient location during evaluation: Endoscopy Anesthesia Type: General Level of consciousness: awake and alert Pain management: pain level controlled Vital Signs Assessment: post-procedure vital signs reviewed and stable Respiratory status: spontaneous breathing, nonlabored ventilation, respiratory function stable and patient connected to nasal cannula oxygen Cardiovascular status: blood pressure returned to baseline and stable Postop Assessment: no apparent nausea or vomiting Anesthetic complications: no     Last Vitals:  Vitals:   10/19/19 1250 10/19/19 1348  BP: (!) 106/54 100/66  Pulse:  66  Resp: 20 15  Temp: 36.6 C 36.7 C  SpO2: 97% 99%    Last Pain:  Vitals:   10/20/19 0744  TempSrc:   PainSc: 0-No pain                 Precious Haws Vora Clover

## 2019-10-26 ENCOUNTER — Telehealth: Payer: Self-pay | Admitting: Gastroenterology

## 2019-10-26 NOTE — Telephone Encounter (Signed)
Monica Whitaker from [facility] left vm fro Monica Whitaker regarding a referral she received pt is scheduled for apt on Monday 11/13/19 any questions please call [phone]

## 2019-10-26 NOTE — Telephone Encounter (Signed)
FYI-see below- 

## 2019-10-30 DIAGNOSIS — K862 Cyst of pancreas: Secondary | ICD-10-CM | POA: Diagnosis not present

## 2019-10-30 NOTE — Telephone Encounter (Signed)
Prolia approved.

## 2019-10-30 NOTE — Telephone Encounter (Signed)
Prolia injection scheduled. 12/26/19

## 2019-11-07 DIAGNOSIS — H40052 Ocular hypertension, left eye: Secondary | ICD-10-CM | POA: Diagnosis not present

## 2019-11-13 ENCOUNTER — Telehealth: Payer: Self-pay | Admitting: Psychiatry

## 2019-11-13 ENCOUNTER — Other Ambulatory Visit: Payer: Self-pay | Admitting: Psychiatry

## 2019-11-13 MED ORDER — LORAZEPAM 0.5 MG PO TABS: 0.5000 mg | ORAL_TABLET | Freq: Three times a day (TID) | ORAL | 0 refills | Status: DC

## 2019-11-13 NOTE — Telephone Encounter (Signed)
Prescription sent

## 2019-11-13 NOTE — Telephone Encounter (Signed)
Monica Whitaker was told that she may not have pancreatic cancer. Set up with a pancreatic doctor and has to wait over the next three weeks for surgery. She is very nervous, anxious, and still not for sure. She is in the dark, but has had panic attacks. She is asking for Lorazepam RF.  [facility] [place]

## 2019-11-14 DIAGNOSIS — H40052 Ocular hypertension, left eye: Secondary | ICD-10-CM | POA: Diagnosis not present

## 2019-11-16 ENCOUNTER — Ambulatory Visit (INDEPENDENT_AMBULATORY_CARE_PROVIDER_SITE_OTHER): Payer: PPO | Admitting: *Deleted

## 2019-11-16 DIAGNOSIS — I480 Paroxysmal atrial fibrillation: Secondary | ICD-10-CM | POA: Diagnosis not present

## 2019-11-16 LAB — CUP PACEART REMOTE DEVICE CHECK
Date Time Interrogation Session: 20210414233559
Implantable Pulse Generator Implant Date: 20201210

## 2019-11-16 NOTE — Progress Notes (Signed)
ILR Remote 

## 2019-11-17 ENCOUNTER — Telehealth: Payer: Self-pay | Admitting: Gastroenterology

## 2019-11-17 NOTE — Telephone Encounter (Signed)
Spoke with pt regarding her upcoming appt at [facility]. Pt had questions regarding her appt. I advised pt that her questions needed to be answered by the surgeon as this was out of my scope and expertise.

## 2019-11-17 NOTE — Telephone Encounter (Signed)
Patient called & ask that Ginger call her.

## 2019-11-28 ENCOUNTER — Other Ambulatory Visit: Payer: Self-pay | Admitting: Psychiatry

## 2019-11-28 DIAGNOSIS — F419 Anxiety disorder, unspecified: Secondary | ICD-10-CM

## 2019-11-28 NOTE — Telephone Encounter (Signed)
I don't believe she has been in since 11/2018

## 2019-12-11 ENCOUNTER — Other Ambulatory Visit: Payer: Self-pay | Admitting: General Surgery

## 2019-12-11 DIAGNOSIS — I4891 Unspecified atrial fibrillation: Secondary | ICD-10-CM | POA: Diagnosis not present

## 2019-12-11 DIAGNOSIS — D490 Neoplasm of unspecified behavior of digestive system: Secondary | ICD-10-CM | POA: Diagnosis not present

## 2019-12-11 DIAGNOSIS — Z7901 Long term (current) use of anticoagulants: Secondary | ICD-10-CM | POA: Diagnosis not present

## 2019-12-12 ENCOUNTER — Encounter: Payer: Self-pay | Admitting: Internal Medicine

## 2019-12-12 DIAGNOSIS — K862 Cyst of pancreas: Secondary | ICD-10-CM | POA: Insufficient documentation

## 2019-12-13 ENCOUNTER — Other Ambulatory Visit: Payer: Self-pay | Admitting: General Surgery

## 2019-12-13 DIAGNOSIS — D490 Neoplasm of unspecified behavior of digestive system: Secondary | ICD-10-CM

## 2019-12-15 ENCOUNTER — Encounter: Payer: Self-pay | Admitting: Internal Medicine

## 2019-12-15 ENCOUNTER — Ambulatory Visit (INDEPENDENT_AMBULATORY_CARE_PROVIDER_SITE_OTHER): Payer: PPO | Admitting: Internal Medicine

## 2019-12-15 ENCOUNTER — Other Ambulatory Visit: Payer: Self-pay

## 2019-12-15 VITALS — BP 110/64 | HR 81 | Temp 97.7°F | Ht 69.0 in | Wt 167.0 lb

## 2019-12-15 DIAGNOSIS — E039 Hypothyroidism, unspecified: Secondary | ICD-10-CM

## 2019-12-15 DIAGNOSIS — F419 Anxiety disorder, unspecified: Secondary | ICD-10-CM | POA: Diagnosis not present

## 2019-12-15 DIAGNOSIS — Z1231 Encounter for screening mammogram for malignant neoplasm of breast: Secondary | ICD-10-CM | POA: Diagnosis not present

## 2019-12-15 DIAGNOSIS — F410 Panic disorder [episodic paroxysmal anxiety] without agoraphobia: Secondary | ICD-10-CM

## 2019-12-15 DIAGNOSIS — F329 Major depressive disorder, single episode, unspecified: Secondary | ICD-10-CM

## 2019-12-15 DIAGNOSIS — K862 Cyst of pancreas: Secondary | ICD-10-CM

## 2019-12-15 NOTE — Patient Instructions (Addendum)
Lorazepam/Ativan 0.5 Dr. Clovis Pu up to 3x per day this is for anxiety/panic/sleep  [facility] therapy referral placed   Meditation apps Replika, Insight Timer,Bloom, Headspace   Premier protein shakes   Mindfulness-Based Stress Reduction Mindfulness-based stress reduction (MBSR) is a program that helps people learn to practice mindfulness. Mindfulness is the practice of intentionally paying attention to the present moment. It can be learned and practiced through techniques such as education, breathing exercises, meditation, and yoga. MBSR includes several mindfulness techniques in one program. MBSR works best when you understand the treatment, are willing to try new things, and can commit to spending time practicing what you learn. MBSR training may include learning about:  How your emotions, thoughts, and reactions affect your body.  New ways to respond to things that cause negative thoughts to start (triggers).  How to notice your thoughts and let go of them.  Practicing awareness of everyday things that you normally do without thinking.  The techniques and goals of different types of meditation. What are the benefits of MBSR? MBSR can have many benefits, which include helping you to:  Develop self-awareness. This refers to knowing and understanding yourself.  Learn skills and attitudes that help you to participate in your own health care.  Learn new ways to care for yourself.  Be more accepting about how things are, and let things go.  Be less judgmental and approach things with an open mind.  Be patient with yourself and trust yourself more. MBSR has also been shown to:  Reduce negative emotions, such as depression and anxiety.  Improve memory and focus.  Change how you sense and approach pain.  Boost your body's ability to fight infections.  Help you connect better with other people.  Improve your sense of well-being. Follow these instructions at home:   Find a local  in-person or online MBSR program.  Set aside some time regularly for mindfulness practice.  Find a mindfulness practice that works best for you. This may include one or more of the following: ? Meditation. Meditation involves focusing your mind on a certain thought or activity. ? Breathing awareness exercises. These help you to stay present by focusing on your breath. ? Body scan. For this practice, you lie down and pay attention to each part of your body from head to toe. You can identify tension and soreness and intentionally relax parts of your body. ? Yoga. Yoga involves stretching and breathing, and it can improve your ability to move and be flexible. It can also provide an experience of testing your body's limits, which can help you release stress. ? Mindful eating. This way of eating involves focusing on the taste, texture, color, and smell of each bite of food. Because this slows down eating and helps you feel full sooner, it can be an important part of a weight-loss plan.  Find a podcast or recording that provides guidance for breathing awareness, body scan, or meditation exercises. You can listen to these any time when you have a free moment to rest without distractions.  Follow your treatment plan as told by your health care provider. This may include taking regular medicines and making changes to your diet or lifestyle as recommended. How to practice mindfulness To do a basic awareness exercise:  Find a comfortable place to sit.  Pay attention to the present moment. Observe your thoughts, feelings, and surroundings just as they are.  Avoid placing judgment on yourself, your feelings, or your surroundings. Make note of any judgment  that comes up, and let it go.  Your mind may wander, and that is okay. Make note of when your thoughts drift, and return your attention to the present moment. To do basic mindfulness meditation:  Find a comfortable place to sit. This may include a  stable chair or a firm floor cushion. ? Sit upright with your back straight. Let your arms fall next to your side with your hands resting on your legs. ? If sitting in a chair, rest your feet flat on the floor. ? If sitting on a cushion, cross your legs in front of you.  Keep your head in a neutral position with your chin dropped slightly. Relax your jaw and rest the tip of your tongue on the roof of your mouth. Drop your gaze to the floor. You can close your eyes if you like.  Breathe normally and pay attention to your breath. Feel the air moving in and out of your nose. Feel your belly expanding and relaxing with each breath.  Your mind may wander, and that is okay. Make note of when your thoughts drift, and return your attention to your breath.  Avoid placing judgment on yourself, your feelings, or your surroundings. Make note of any judgment or feelings that come up, let them go, and bring your attention back to your breath.  When you are ready, lift your gaze or open your eyes. Pay attention to how your body feels after the meditation. Where to find more information You can find more information about MBSR from:  Your health care provider.  Community-based meditation centers or programs.  Programs offered near you. Summary  Mindfulness-based stress reduction (MBSR) is a program that teaches you how to intentionally pay attention to the present moment. It is used with other treatments to help you cope better with daily stress, emotions, and pain.  MBSR focuses on developing self-awareness, which allows you to respond to life stress without judgment or negative emotions.  MBSR programs may involve learning different mindfulness practices, such as breathing exercises, meditation, yoga, body scan, or mindful eating. Find a mindfulness practice that works best for you, and set aside time for it on a regular basis. This information is not intended to replace advice given to you by your  health care provider. Make sure you discuss any questions you have with your health care provider. Document Revised: 07/02/2017 Document Reviewed: 11/26/2016 Elsevier Patient Education  [place].

## 2019-12-15 NOTE — Progress Notes (Signed)
Chief Complaint  Patient presents with  . Follow-up   F/u  1. Increased anxiety and panic attacks with sob about pancreatitic mucinous neoplasm ? Pathology per FNA EUS and needs removal of tail of pancreas surgery upcoming 01/2020 with Dr Barry Dienes which is increasing anxiety for pt ran out of ativan 0.5 bid prn per Dr. Clovis Pu psych, on cymbalta 60 mg qd  2. Hypothyroidism tsh 1.35 06/2019 on levothyroxine 25 mcg qd   Review of Systems  Constitutional: Negative for weight loss.  HENT: Negative for hearing loss.   Eyes: Negative for blurred vision.  Respiratory: Negative for shortness of breath.   Cardiovascular: Negative for chest pain.  Psychiatric/Behavioral: Negative for memory loss. The patient is nervous/anxious.    Past Medical History:  Diagnosis Date  . Anxiety    controlled with meds  . Anxiety   . Arrhythmia   . Atrial fibrillation ([name])    paroxysmal, failed medical therapy with flecainide,  NSVT with tikosyn  . Bruises easily   . CVA (cerebral vascular accident) ([place]) 12/2007  . Depression    medically controlled   . Glaucoma    also with macular holes AE Dr. Thomasene Ripple  . Low blood pressure    no falls, but if gets up to fast  . RLS (restless legs syndrome)   . Stroke [facility]) 7 years ago   Lasting effects on balance, different personality.  . Thyroid disease   . Tremor   . 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 ATRIAL FIBRILLATION ABLATION  10/18/12   PVI by Dr Rayann Heman  . ATRIAL FIBRILLATION ABLATION N/A 10/18/2012   Procedure: ATRIAL FIBRILLATION ABLATION;  Surgeon: Thompson Grayer, MD;  Location: [facility] CATH LAB;  Service: Cardiovascular;  Laterality: N/A;  . ATRIAL FIBRILLATION ABLATION N/A 04/18/2019   Procedure: ATRIAL FIBRILLATION ABLATION;  Surgeon: Thompson Grayer, MD;  Location: [place] CV LAB;  Service: Cardiovascular;  Laterality: N/A;  . EUS N/A 10/19/2019   Procedure: FULL  UPPER ENDOSCOPIC ULTRASOUND (EUS) RADIAL;  Surgeon: Holly Bodily, MD;  Location: [facility] ENDOSCOPY;  Service: Gastroenterology;  Laterality: N/A;  . EYE SURGERY     on L/R eye, macular hole   . FRACTURE SURGERY    . implantable loop recorder placement  07/13/2019   MDT Reveal LINQ1 [facility] [id number] S) implanted in office by Dr Rayann Heman for afib management post ablation  . OPEN REDUCTION INTERNAL FIXATION (ORIF) DISTAL RADIAL FRACTURE Right 11/15/2014   Procedure: OPEN REDUCTION INTERNAL FIXATION (ORIF) RIGHT DISTAL RADIAL FRACTURE WITH ALLOGRAFT BONE GRAFT;  Surgeon: Roseanne Kaufman, MD;  Location: [facility];  Service: Orthopedics;  Laterality: Right;  . TEE WITHOUT CARDIOVERSION N/A 10/18/2012   Procedure: TRANSESOPHAGEAL ECHOCARDIOGRAM (TEE);  Surgeon: Lelon Perla, MD;  Location: [facility] ENDOSCOPY;  Service: Cardiovascular;  Laterality: N/A;  Pre-Ablation at 12pm  . TONSILLECTOMY    . VAGINAL HYSTERECTOMY     Family History  Problem Relation Age of Onset  . Multiple sclerosis Father   . Breast cancer Neg Hx    Social History   Socioeconomic History  . Marital status: Widowed    Spouse name: Not on file  . Number of children: 0  . Years of education: Not on file  . Highest education level: Not on file  Occupational History  . Occupation: Retired    Fish farm manager: RETIRED  Tobacco Use  . Smoking status: Never  Smoker  . Smokeless tobacco: Never Used  Substance and Sexual Activity  . Alcohol use: Yes    Alcohol/week: 1.0 standard drinks    Types: 1 Standard drinks or equivalent per week    Comment: regular  . Drug use: No  . Sexual activity: Yes  Other Topics Concern  . Not on file  Social History Narrative   Lives in [place] with her husband.  No children 2 step kids.  Former Psychologist, prison and probation services   Enjoys exercise- water classes, yoga Editor, commissioning.    Widowed 2019    Used to sail with husband      Has a living will-    Would desire CPR   Would not want  prolonged life support if futile.      Has 1 cat    Social Determinants of Health   Financial Resource Strain: Low Risk   . Difficulty of Paying Living Expenses: Not hard at all  Food Insecurity: No Food Insecurity  . Worried About Charity fundraiser in the Last Year: Never true  . Ran Out of Food in the Last Year: Never true  Transportation Needs: No Transportation Needs  . Lack of Transportation (Medical): No  . Lack of Transportation (Non-Medical): No  Physical Activity: Sufficiently Active  . Days of Exercise per Week: 7 days  . Minutes of Exercise per Session: 60 min  Stress: No Stress Concern Present  . Feeling of Stress : Only a little  Social Connections: Unknown  . Frequency of Communication with Friends and Family: Three times a week  . Frequency of Social Gatherings with Friends and Family: Once a week  . Attends Religious Services: Not on file  . Active Member of Clubs or Organizations: Not on file  . Attends Archivist Meetings: Not on file  . Marital Status: Widowed  Intimate Partner Violence: Not At Risk  . Fear of Current or Ex-Partner: No  . Emotionally Abused: No  . Physically Abused: No  . Sexually Abused: No   Current Meds  Medication Sig  . acetaminophen (TYLENOL) 500 MG tablet Take 1,000 mg by mouth every 6 (six) hours as needed (pain). Take 2 tablets by mouth every 6 hours as needed for pain  . amphetamine-dextroamphetamine (ADDERALL XR) 10 MG 24 hr capsule Take 1 capsule (10 mg total) by mouth daily.  . calcium carbonate (OS-CAL) 600 MG TABS Take 600 mg by mouth daily with breakfast.   . cholecalciferol (VITAMIN D) 1000 UNITS tablet Take 2,000 Units by mouth daily.   Marland Kitchen CRANBERRY EXTRACT PO Take by mouth.  . denosumab (PROLIA) 60 MG/ML SOLN injection Inject 60 mg into the skin every 6 (six) months.   . diltiazem (CARDIZEM CD) 240 MG 24 hr capsule TAKE 1 CAPSULE (240 MG TOTAL) BY MOUTH DAILY. MAY TAKE EXTRA CAPSULE DAILY AS NEEDED FOR AFIB  .  DULoxetine (CYMBALTA) 60 MG capsule TAKE 1 CAPSULE (60 MG TOTAL) BY MOUTH 2 (TWO) TIMES DAILY.  Marland Kitchen ELIQUIS 5 MG TABS tablet TAKE 1 TABLET BY MOUTH TWICE A DAY  . estradiol (ESTRACE VAGINAL) 0.1 MG/GM vaginal cream Apply 0.5mg  (pea-sized amount)  just inside the vaginal introitus with a finger-tip on Monday, Wednesday and Friday nights.  . flecainide (TAMBOCOR) 100 MG tablet TAKE 1 TABLET BY MOUTH TWICE A DAY  . gabapentin (NEURONTIN) 100 MG capsule Take 2 capsules (200 mg total) by mouth 2 (two) times daily.  Marland Kitchen levothyroxine (SYNTHROID) 25 MCG tablet Take 1 tablet (25  mcg total) by mouth daily. 30 minutes before food  . LORazepam (ATIVAN) 0.5 MG tablet Take 1 tablet (0.5 mg total) by mouth every 8 (eight) hours.  . Melatonin ([facility] MELATONIN) 5 MG SUBL Place under the tongue.  . Multiple Vitamin (MULTIVITAMIN) tablet Take 1 tablet by mouth daily.    . pravastatin (PRAVACHOL) 20 MG tablet Take 1 tablet (20 mg total) by mouth at bedtime.  Marland Kitchen SALT SUBSTITUTES PO Take 1 tablet by mouth.  . timolol (TIMOPTIC) 0.5 % ophthalmic solution Place 1 drop into the left eye daily.   . vitamin B-12 (CYANOCOBALAMIN) 1000 MCG tablet Take 1,000 mcg by mouth daily.  . vitamin C (ASCORBIC ACID) 500 MG tablet Take 500 mg by mouth daily.  . vitamin E 400 UNIT capsule Take 400 Units by mouth daily.     Allergies  Allergen Reactions  . Darvon Other (See Comments)    Severe panic attacks  . Propoxyphene Other (See Comments)    GI Upset Severe panic attacks GI Upset  . Propoxyphene N-Acetaminophen Other (See Comments)    Severe panic attacks  . Levofloxacin Anxiety and Other (See Comments)    Causes panic attacks.   Recent Results (from the past 2160 hour(s))  CUP PACEART REMOTE DEVICE CHECK     Status: None   Collection Time: 09/18/19 12:20 AM  Result Value Ref Range   Date Time Interrogation Session [phone]    Pulse Generator Manufacturer MERM    Pulse Gen Model [id number] Reveal LINQ    Pulse Gen Serial  Number [id number]    Clinic Name [facility]    Implantable Pulse Generator Type ICM/ILR    Implantable Pulse Generator Implant Date [id number]   SARS CORONAVIRUS 2 (TAT 6-24 HRS) Nasopharyngeal Nasopharyngeal Swab     Status: None   Collection Time: 10/17/19 11:57 AM   Specimen: Nasopharyngeal Swab  Result Value Ref Range   SARS Coronavirus 2 NEGATIVE NEGATIVE    Comment: (NOTE) SARS-CoV-2 target nucleic acids are NOT DETECTED. The SARS-CoV-2 RNA is generally detectable in upper and lower respiratory specimens during the acute phase of infection. Negative results do not preclude SARS-CoV-2 infection, do not rule out co-infections with other pathogens, and should not be used as the sole basis for treatment or other patient management decisions. Negative results must be combined with clinical observations, patient history, and epidemiological information. The expected result is Negative. Fact Sheet for Patients: SugarRoll.be Fact Sheet for Healthcare Providers: https://www.woods-mathews.com/ This test is not yet approved or cleared by the [place] FDA and  has been authorized for detection and/or diagnosis of SARS-CoV-2 by FDA under an Emergency Use Authorization (EUA). This EUA will remain  in effect (meaning this test can be used) for the duration of the COVID-19 declaration under Section 56 4(b)(1) of the Act, 21 U.S.C. section 360bbb-3(b)(1), unless the authorization is terminated or revoked sooner. Performed at [facility], [place] [address] [place], [place] [address]   Amylase, Body Fluid (other)     Status: None   Collection Time: 10/19/19  1:38 PM  Result Value Ref Range   Source of Sample PANCREATIC CYST FLUID     Comment: Performed at [facility], [address] [place], [place] [address]   Amylase, Body Fluid 5,764 U/L    Comment: (NOTE)                             _________________________________________                           :  BODY FLUID TYPE :        AMYLASE        :                           :_________________:_______________________:                           : Lymph           :        40 - 83        :                           :_________________:_______________________:                           : Peritoneal      :                       :                           : Fluid           :        88 - 109       :                           :_________________:_______________________:                           : Saliva          :                       :                           : (Mixed Glands)  :     [id number]    :                           :_________________:_______________________:                            Louanne Skye, Ehrhardt V. Reference Intervals                            for Adults and Children 2008. Ninth                            Edition (V9.1) [place],                             [place]; [place]: July 2009. The method performance specifications have not been established for this test in body fluid.  The test result should be integrated into the clinical context for interpretation. Results confirmed on dilution. Performed At: [facility] [place], [place] [id number] Rush Farmer MD [id number]   Cytology - Non PAP;     Status: None   Collection Time: 10/19/19  2:37 PM  Result Value  Ref Range   CYTOLOGY - NON GYN      CYTOLOGY - NON PAP CASE: [id number] PATIENT: Bernece Bocchino Non-Gynecological Cytology Report     Specimen Submitted: A. Pancreas body cyst; FNA  Clinical History: Pancreas body cyst    DIAGNOSIS: A.  PANCREATIC BODY, CYST; EUS GUIDED FINE-NEEDLE ASPIRATION: - MUCINOUS NEOPLASM, SEE COMMENT.  Comment: Smears display clusters of mucinous appearing epithelium. There is mild nuclear pleomorphism, and mild architectural disarray. Focal fragments could be  suggestive of papillary growth. Clinical laboratory testing of aspirated cyst fluid shows increased amylase, with CEA levels pending at the time of this examination.  The findings are compatible with a mucinous neoplasm, with the differential diagnosis including a mucinous cystic neoplasm or intraductal papillary mucinous neoplasm. There is no evidence of invasive adenocarcinoma.  Slides reviewed: 1 ThinPrep, 1 cell block, 1 Diff-Quik, 1 Pap stain  GROSS DESCRIPTION: A. Site: Pancreas body cyst Procedure:  EUS Cytology specimen: FNA/needle biopsy and rinse Cytotechnologist(s): Vance Peper and Veryl Speak  Specimen material collected and submitted for: 1 Diff Quik stained slides 1 Pap stained slides Specimen material submitted for: Cell block and ThinPrep  Specimen description: Fixative: CytoLyt Volume: 20 mL Color: Pink Transparency: Clear Tissue fragments present: Yes Collection brush(es) present: No   Final Diagnosis performed by Allena Napoleon, MD.   Electronically signed 10/20/2019 3:52:18PM The electronic signature indicates that the named Attending Pathologist has evaluated the specimen Technical component performed at [facility], [address], [place], [place] [address] Lab: [phone] Dir: Rush Farmer, MD, MMM  Professional component performed at [facility], [place], [place], [place], [place] [address] Lab: [phone] Dir: Dellia Nims. Reuel Derby, MD   CUP PACEART REMOTE DEVICE CHECK     Status: None   Collection Time: 11/15/19 11:35 PM  Result Value Ref Range   Date Time Interrogation Session [id number]    Pulse Generator Manufacturer MERM    Pulse Gen Model [id number] Reveal LINQ    Pulse Gen Serial Number [id number]    Clinic Name [facility]    Implantable Pulse Generator Type ICM/ILR    Implantable Pulse Generator Implant Date [id number]    Objective  Body mass index is 24.66 kg/m. Wt Readings from Last 3 Encounters:   12/15/19 167 lb (75.8 kg)  10/12/19 167 lb 9.6 oz (76 kg)  10/11/19 166 lb (75.3 kg)   Temp Readings from Last 3 Encounters:  12/15/19 97.7 F (36.5 C) (Skin)  10/19/19 98.1 F (36.7 C) (Temporal)  10/12/19 98.2 F (36.8 C) (Oral)   BP Readings from Last 3 Encounters:  12/15/19 110/64  10/19/19 100/66  10/12/19 99/64   Pulse Readings from Last 3 Encounters:  12/15/19 81  10/19/19 66  10/12/19 79    Physical Exam Vitals and nursing note reviewed.  Constitutional:      Appearance: Normal appearance. She is well-developed and well-groomed.  HENT:     Head: Normocephalic and atraumatic.  Eyes:     Conjunctiva/sclera: Conjunctivae normal.     Pupils: Pupils are equal, round, and reactive to light.  Cardiovascular:     Rate and Rhythm: Normal rate and regular rhythm.     Heart sounds: Normal heart sounds. No murmur.  Pulmonary:     Effort: Pulmonary effort is normal.     Breath sounds: Normal breath sounds.  Skin:    General: Skin is warm and dry.  Neurological:     General: No focal deficit present.     Mental Status: She is  alert and oriented to person, place, and time. Mental status is at baseline.     Gait: Gait normal.  Psychiatric:        Attention and Perception: Attention and perception normal.        Mood and Affect: Mood and affect normal.        Speech: Speech normal.        Behavior: Behavior normal. Behavior is cooperative.        Thought Content: Thought content normal.        Cognition and Memory: Cognition and memory normal.        Judgment: Judgment normal.     Assessment  Plan  Anxiety/panic and depression - Plan: Ambulatory referral to Psychology [facility] psych meds and f/u Dr. Clovis Pu psychiatry  She needs refill of ativan 0.5 was taking bid to tid cc'ed psych Dr. Clovis Pu  Hypothyroidism on levo 25 mcg  Labs at f/u   Cystic pancreatic mass  Pending removal of tail of pancreas Dr. Barry Dienes  CC'ed Dr. Barry Dienes pt wants pre surgery and post  surgery diet list advise now so she can get prepared   HM Flu shot utd  utd prevnar  pna 23 due 04/2020  Tdap utd  shingrix had 2/2 shots covid vx had 2/2   Pap out of age window  mammo 07/31/19 negative ordered repeat Colonoscopy had 03/06/14 diverticulosis  02/17/16 osteoporosis reordered another  dexa + osteoporosis next prolia shot due 1123//2020 due 12/24/18  Skin [facility] derm pt to calldue 10/2019  Echo had 04/11/2019 ef 60-65% She is doing exercise PT at [facility] Dr. Allen Norris  Dental Dr. Sandrea Hughs will establish   Provider: Dr. Olivia Mackie McLean-Scocuzza-Internal Medicine

## 2019-12-15 NOTE — Progress Notes (Signed)
Pre visit review using our clinic review tool, if applicable. No additional management support is needed unless otherwise documented below in the visit note. 

## 2019-12-17 ENCOUNTER — Other Ambulatory Visit: Payer: Self-pay | Admitting: Internal Medicine

## 2019-12-17 IMAGING — CT CT ABD-PELV W/ CM
2 of 5 series · 16 of 46 positions shown, 18 images · IV contrast (omnipaque)
Comparison: None.

CLINICAL DATA: 72-year-old female with right lower quadrant
abdominal pain.

EXAM:
CT ABDOMEN AND PELVIS WITH CONTRAST
TECHNIQUE: Multidetector CT imaging of the abdomen and pelvis was performed
using the standard protocol following bolus administration of
intravenous contrast.
CONTRAST:  100mL OMNIPAQUE IOHEXOL 300 MG/ML  SOLN

[Series 2: abd pelvis 5.00 · axial · 0.75mm/px · z∈[-1496,-1101]mm · 13 of 91 slices shown, 15 images]
[im 6/91  soft-tissue]
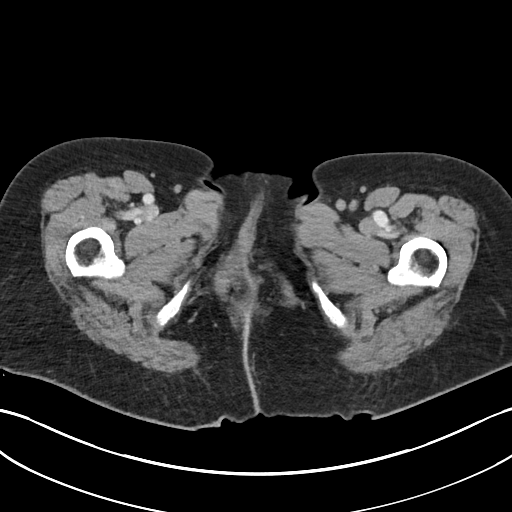
[im 6/91  bone]
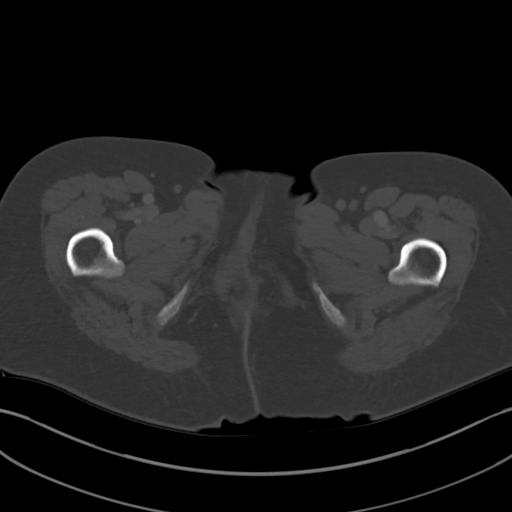
[im 11/91  soft-tissue]
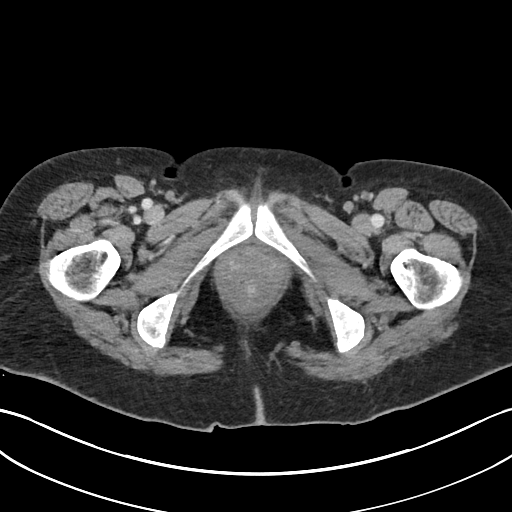
[im 22/91  soft-tissue]
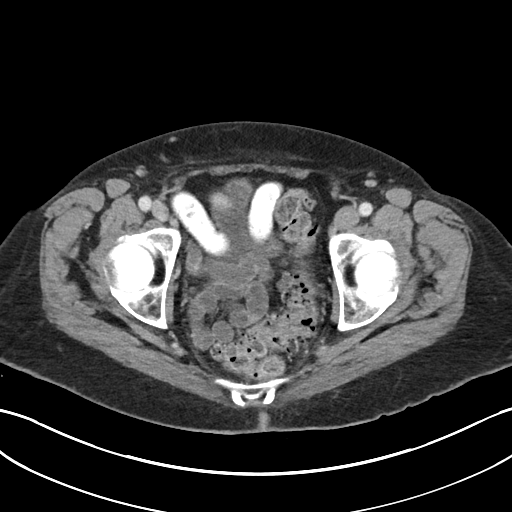
[im 27/91  soft-tissue]
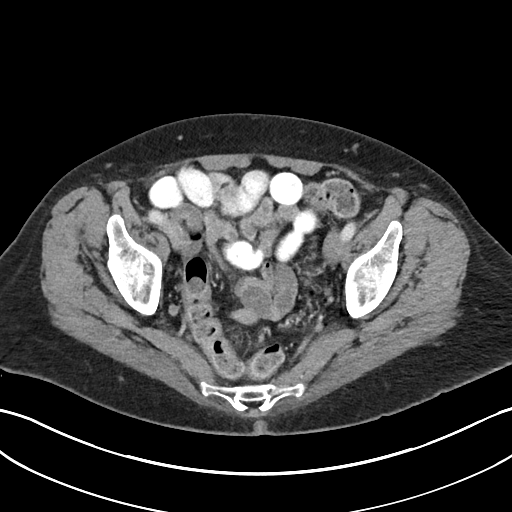
[im 32/91  soft-tissue]
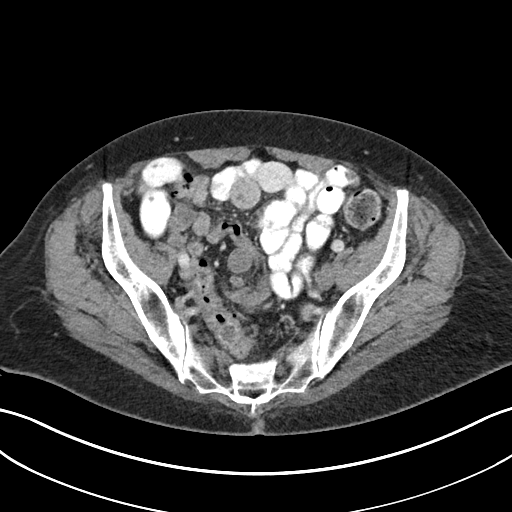
[im 38/91  soft-tissue]
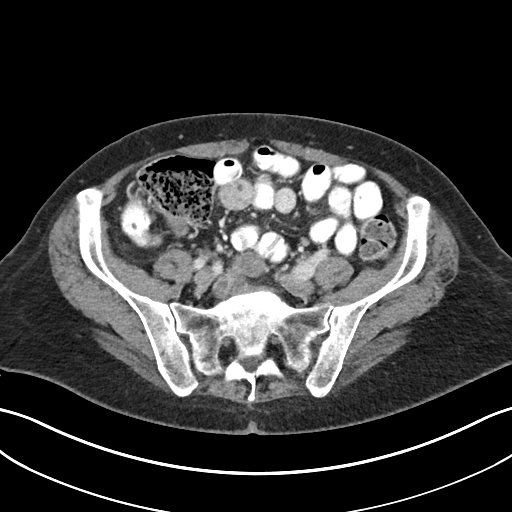
[im 48/91  soft-tissue]
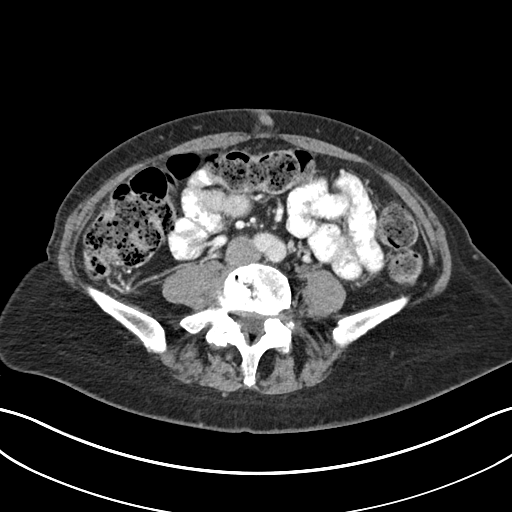
[im 53/91  soft-tissue]
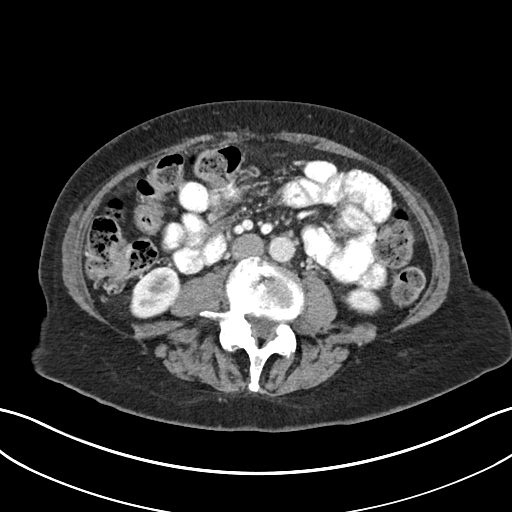
[im 59/91  soft-tissue]
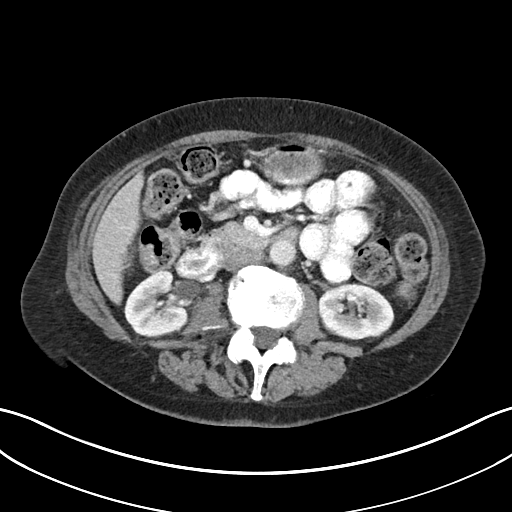
[im 59/91  bone]
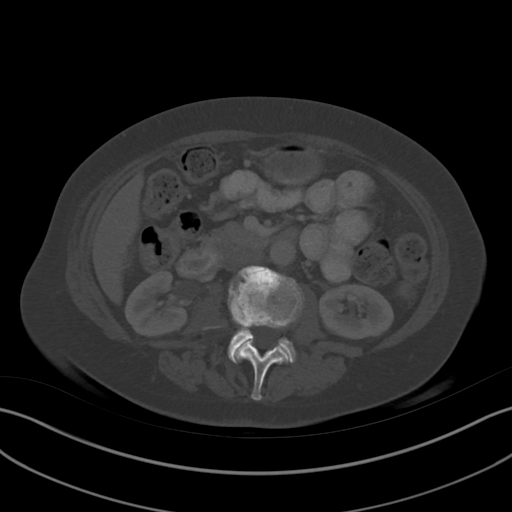
[im 64/91  soft-tissue]
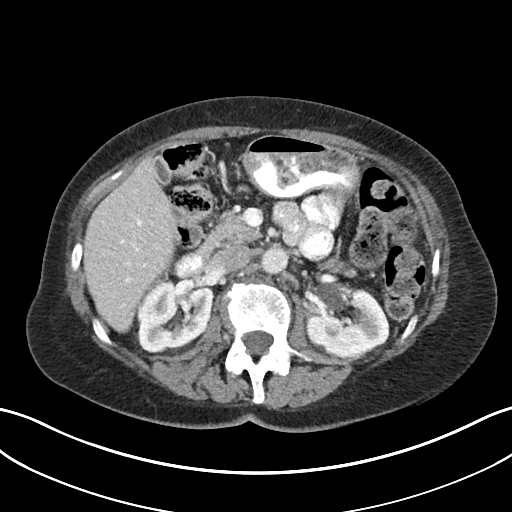
[im 69/91  soft-tissue]
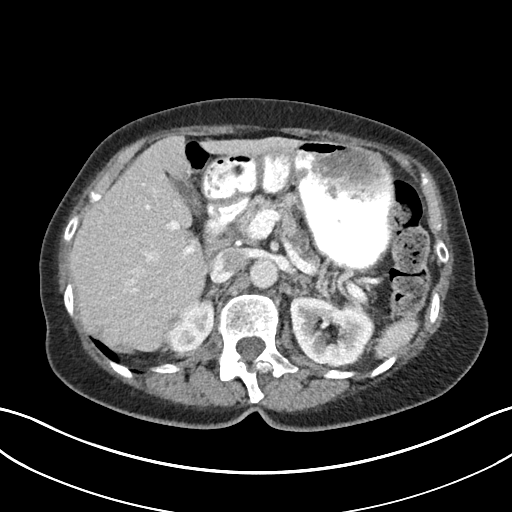
[im 80/91  soft-tissue]
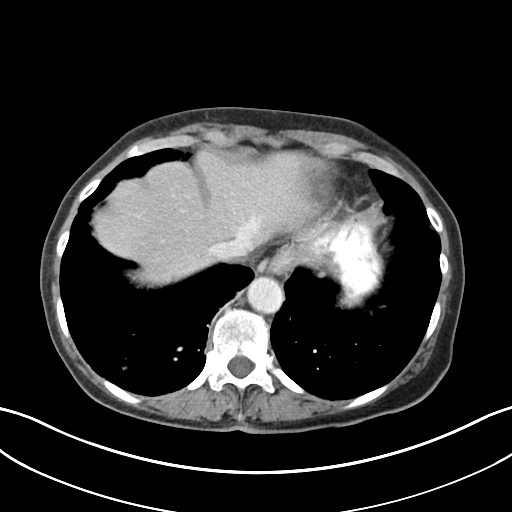
[im 85/91  soft-tissue]
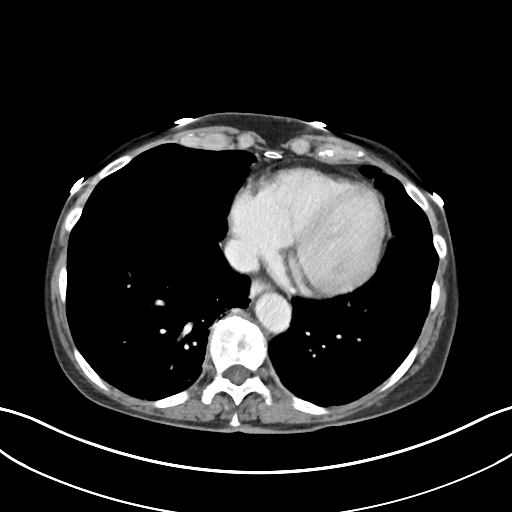

[Series 4: coronals abd pelvis 2.00 cor · coronal · 0.75mm/px · 3 of 128 slices shown]
[im 43/128  soft-tissue]
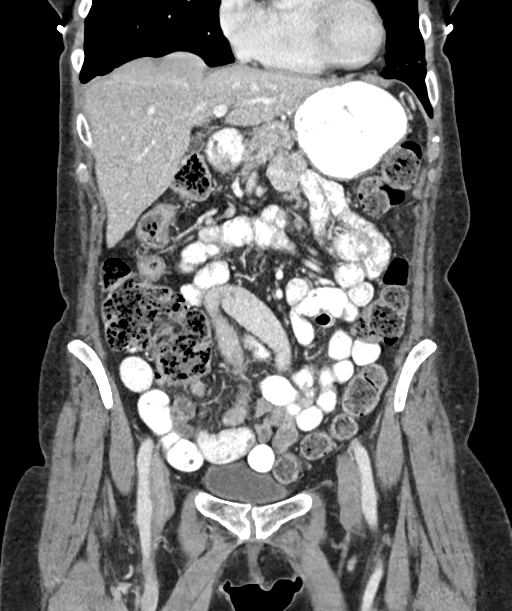
[im 57/128  soft-tissue]
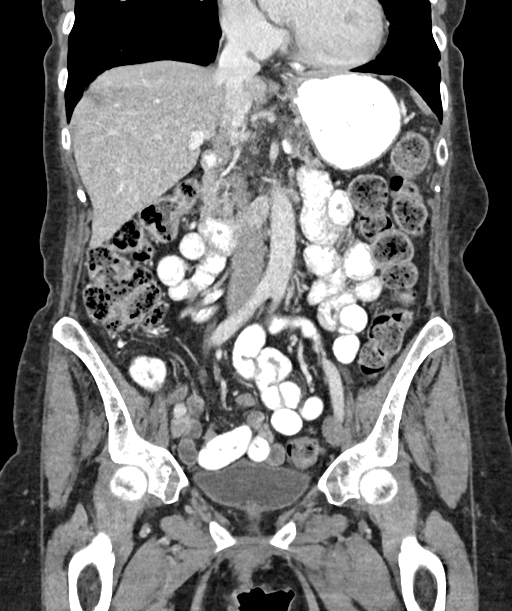
[im 71/128  soft-tissue]
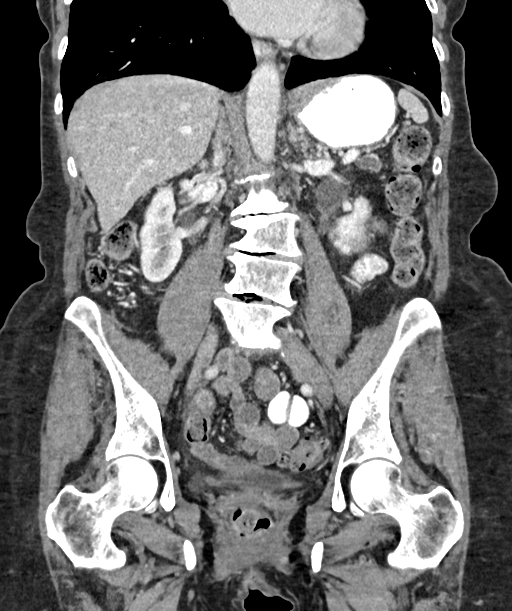

[16 of 46 positions shown; findings below may reference images not displayed]

FINDINGS: Lower chest: There are bibasilar linear atelectasis/scarring. The
visualized lung bases are otherwise clear.

No intra-abdominal free air or free fluid.

Hepatobiliary: The liver is unremarkable. No intrahepatic biliary
ductal dilatation. The gallbladder is unremarkable.

Pancreas: Multiple low attenuating/cystic lesions noted throughout
the pancreas. The largest lesion measures approximately 1.6 x 2.1 cm
in the distal body (series 4, image 62). There is mild atrophy of
the tail of the pancreas with probable mild dilatation of the main
pancreatic duct. Evaluation of these lesions is limited on this CT
but may represent side branch IPMN or other mucinous lesions.
Further evaluation with pancreatic protocol MRI is recommended. A
1.8 x 3.7 cm low attenuating/cystic lesion or cluster of adjacent
small cystic lesions noted in the proximal body of the pancreas
(series 4, image 54 and series 2, image 20). No active inflammatory
changes.

Spleen: Normal in size without focal abnormality.

Adrenals/Urinary Tract: The kidneys, visualized ureters, and urinary
bladder appear unremarkable. Several subcentimeter bilateral renal
hypodense lesions are too small to characterize. There is no
hydronephrosis on either side. There is symmetric enhancement and
excretion of contrast by both kidneys.

Stomach/Bowel: There is sigmoid diverticulosis without active
inflammatory changes. Large amount of stool noted throughout the
colon. There is no bowel obstruction or active inflammation. The
appendix is normal.

Vascular/Lymphatic: The abdominal aorta and IVC are unremarkable. No
portal venous gas. There is no adenopathy.

Reproductive: Hysterectomy. No adnexal masses.

Other: Small fat containing umbilical hernia.

Musculoskeletal: Scoliosis and degenerative changes of the spine. No
acute osseous pathology.
IMPRESSION: 1. No acute intra-abdominal or pelvic pathology.
2. Sigmoid diverticulosis. No bowel obstruction or active
inflammation. Normal appendix.
3. Multiple low attenuating/cystic lesions throughout the pancreas
as described. Further evaluation with pancreatic protocol MRI is
recommended.
4. Aortic Atherosclerosis ([id number]).

## 2019-12-18 ENCOUNTER — Telehealth: Payer: Self-pay

## 2019-12-18 ENCOUNTER — Ambulatory Visit (INDEPENDENT_AMBULATORY_CARE_PROVIDER_SITE_OTHER): Payer: PPO | Admitting: *Deleted

## 2019-12-18 ENCOUNTER — Telehealth: Payer: Self-pay | Admitting: Psychiatry

## 2019-12-18 ENCOUNTER — Other Ambulatory Visit: Payer: Self-pay | Admitting: Psychiatry

## 2019-12-18 DIAGNOSIS — F908 Attention-deficit hyperactivity disorder, other type: Secondary | ICD-10-CM

## 2019-12-18 DIAGNOSIS — I480 Paroxysmal atrial fibrillation: Secondary | ICD-10-CM | POA: Diagnosis not present

## 2019-12-18 LAB — CUP PACEART REMOTE DEVICE CHECK
Date Time Interrogation Session: 20210515234149
Implantable Pulse Generator Implant Date: 20201210

## 2019-12-18 MED ORDER — LORAZEPAM 0.5 MG PO TABS: 0.5000 mg | ORAL_TABLET | Freq: Three times a day (TID) | ORAL | 1 refills | Status: DC

## 2019-12-18 MED ORDER — AMPHETAMINE-DEXTROAMPHET ER 10 MG PO CP24: 10.0000 mg | ORAL_CAPSULE | Freq: Every day | ORAL | 0 refills | Status: DC

## 2019-12-18 NOTE — Telephone Encounter (Signed)
Eliquis 5mg  refill request received. Patient is 74 years old, weight-75.8kg, Crea-0.80 on 06/27/2019, Diagnosis-Afib, and last seen by Dr. Rayann Heman on 10/11/2019. Dose is appropriate based on dosing criteria. Will send in refill to requested pharmacy.

## 2019-12-18 NOTE — Telephone Encounter (Signed)
We will schedule televisit after her surgery for possible pancreatic tumor.

## 2019-12-18 NOTE — Telephone Encounter (Signed)
Monica Whitaker called to request refill of her Adderall.  Please send this to [facility] in [place].  Also her PCP was going to send you a note about prescribing something to help her stay claim.  She is very anxious about upcoming surgery and needs something to help her through this time.  [facility] [address] in [place].

## 2019-12-19 ENCOUNTER — Ambulatory Visit (INDEPENDENT_AMBULATORY_CARE_PROVIDER_SITE_OTHER): Payer: PPO | Admitting: Psychologist

## 2019-12-19 ENCOUNTER — Telehealth: Payer: Self-pay | Admitting: Internal Medicine

## 2019-12-19 ENCOUNTER — Other Ambulatory Visit: Payer: Self-pay

## 2019-12-19 DIAGNOSIS — F4322 Adjustment disorder with anxiety: Secondary | ICD-10-CM

## 2019-12-19 DIAGNOSIS — F908 Attention-deficit hyperactivity disorder, other type: Secondary | ICD-10-CM

## 2019-12-19 MED ORDER — AMPHETAMINE-DEXTROAMPHET ER 10 MG PO CP24: 10.0000 mg | ORAL_CAPSULE | Freq: Every day | ORAL | 0 refills | Status: DC

## 2019-12-19 NOTE — Telephone Encounter (Signed)
Monica Whitaker called this morning.  She had requested that the Adderall refill be sent to [facility], [facility]  [facility] and [facility].  [facility].

## 2019-12-19 NOTE — Telephone Encounter (Signed)
Prior approval received for her amphetamine-dextroamphetamine ER 10 mg #90 effective 12/19/2019-12/18/2020 PA# [id number] with [employer]

## 2019-12-19 NOTE — Telephone Encounter (Signed)
Patient informed and verbalized understanding

## 2019-12-19 NOTE — Progress Notes (Signed)
Carelink Summary Report / Loop Recorder 

## 2019-12-19 NOTE — Telephone Encounter (Signed)
Dr. Barry Dienes diet before surgery inform pt of above No specific diet list pre op. Just make sure standard fruit/vegetable/whole grains diet.    Fb        [place]

## 2019-12-19 NOTE — Telephone Encounter (Signed)
-----   Message from Stark Klein, MD sent at 12/15/2019 10:34 PM EDT ----- No specific diet list pre op.  Just make sure standard fruit/vegetable/whole grains diet.    Fb ----- Message ----- From: McLean-Scocuzza, Nino Glow, MD Sent: 12/15/2019   1:23 PM EDT To: Stark Klein, MD  Pt wants diet list prior to surgery in advance I.e now if possible to get prepared for upcoming surgery on pancreas

## 2019-12-19 NOTE — Telephone Encounter (Signed)
Yes I refilled Adderall and lorazepam.  Thanks.

## 2019-12-19 NOTE — Telephone Encounter (Signed)
Received a prior authorization for her AMPHETAMINE-DEXTROAMPHETAMINE ER 10 MG, submitted with [employer]. Pending response at this time.

## 2019-12-19 NOTE — Telephone Encounter (Signed)
Noted will change her pharmacy, her prior authorization was just approved this morning as well for it.

## 2019-12-22 ENCOUNTER — Encounter: Payer: Self-pay | Admitting: Internal Medicine

## 2019-12-22 DIAGNOSIS — L570 Actinic keratosis: Secondary | ICD-10-CM | POA: Diagnosis not present

## 2019-12-22 DIAGNOSIS — L821 Other seborrheic keratosis: Secondary | ICD-10-CM | POA: Diagnosis not present

## 2019-12-22 DIAGNOSIS — D229 Melanocytic nevi, unspecified: Secondary | ICD-10-CM | POA: Diagnosis not present

## 2019-12-22 DIAGNOSIS — L814 Other melanin hyperpigmentation: Secondary | ICD-10-CM | POA: Diagnosis not present

## 2019-12-26 ENCOUNTER — Other Ambulatory Visit: Payer: Self-pay

## 2019-12-26 ENCOUNTER — Ambulatory Visit (INDEPENDENT_AMBULATORY_CARE_PROVIDER_SITE_OTHER): Payer: PPO | Admitting: *Deleted

## 2019-12-26 DIAGNOSIS — M810 Age-related osteoporosis without current pathological fracture: Secondary | ICD-10-CM | POA: Diagnosis not present

## 2019-12-26 MED ORDER — DENOSUMAB 60 MG/ML ~~LOC~~ SOSY
60.0000 mg | PREFILLED_SYRINGE | Freq: Once | SUBCUTANEOUS | Status: AC
  Administered 2019-12-26: 60 mg via SUBCUTANEOUS

## 2019-12-26 NOTE — Progress Notes (Signed)
Patient presented for Prolia injection to Right arm Monica Whitaker, patient voiced no concerns or complaints during or after injection. I also added the [employer] referral fo rpatient upcoming pancreatic surgery.

## 2019-12-27 ENCOUNTER — Ambulatory Visit
Admission: RE | Admit: 2019-12-27 | Discharge: 2019-12-27 | Disposition: A | Discharge status: Home / Self Care | Payer: PPO | Source: Ambulatory Visit | Attending: General Surgery | Admitting: General Surgery

## 2019-12-27 DIAGNOSIS — D490 Neoplasm of unspecified behavior of digestive system: Secondary | ICD-10-CM

## 2019-12-27 DIAGNOSIS — K449 Diaphragmatic hernia without obstruction or gangrene: Secondary | ICD-10-CM | POA: Diagnosis not present

## 2019-12-27 DIAGNOSIS — K862 Cyst of pancreas: Secondary | ICD-10-CM | POA: Diagnosis not present

## 2019-12-27 MED ORDER — IOPAMIDOL (ISOVUE-300) INJECTION 61%
100.0000 mL | Freq: Once | INTRAVENOUS | Status: AC | PRN
  Administered 2019-12-27: 100 mL via INTRAVENOUS

## 2019-12-29 ENCOUNTER — Ambulatory Visit (INDEPENDENT_AMBULATORY_CARE_PROVIDER_SITE_OTHER): Payer: PPO | Admitting: Psychologist

## 2019-12-29 DIAGNOSIS — F4322 Adjustment disorder with anxiety: Secondary | ICD-10-CM

## 2020-01-12 ENCOUNTER — Telehealth: Payer: Self-pay | Admitting: Internal Medicine

## 2020-01-12 NOTE — Telephone Encounter (Signed)
  Community Resource Referral   [place] 01/12/2020  1st Attempt  DOB: [birth year]   AGE: 74 y.o.   GENDER: female   PCP McLean-Scocuzza, Nino Glow, MD.   Called pt regarding Community Resource Referral LMTCB Follow up on: 01/22/2020 [place] . [email or web address]  [phone]  Notes: Patient is having her pancreas partially removed on 02/17/20 and is alone needs someone to be an advocate for her while she is in the hospital she is afraid. Looking for a patient advocate.

## 2020-01-16 ENCOUNTER — Ambulatory Visit: Payer: PPO | Admitting: Psychologist

## 2020-01-22 ENCOUNTER — Ambulatory Visit (INDEPENDENT_AMBULATORY_CARE_PROVIDER_SITE_OTHER): Payer: PPO | Admitting: *Deleted

## 2020-01-22 DIAGNOSIS — I480 Paroxysmal atrial fibrillation: Secondary | ICD-10-CM | POA: Diagnosis not present

## 2020-01-22 LAB — CUP PACEART REMOTE DEVICE CHECK
Date Time Interrogation Session: 20210620232310
Implantable Pulse Generator Implant Date: 20201210

## 2020-01-22 NOTE — Progress Notes (Signed)
Carelink Summary Report / Loop Recorder 

## 2020-01-25 NOTE — Telephone Encounter (Signed)
  Community Resource Referral   [place] 01/25/2020   DOB: [birth year]   AGE: 74 y.o.   GENDER: female   PCP McLean-Scocuzza, Nino Glow, MD.   Called pt regarding Community Resource Referral. She is scheduled for pancreatic surgery on 7/13 and will not have a family member available for her stay estimated for 4-10 days.  Discussed patient advocate assisting her with relaying information back to her DPR Althia Forts.  Pt stated that she uses Mychart and would work to get [place] access as a delegate so that she could send messages to the clinical team and to see notes that are shared there. The nurse told her that they would be in contact with [name] post op to give update for [name] to share with family. Will send [facility] Referral to see if there are social workers within [facility] that would be able to help this patient who will not be cognitively capable of relaying iinformation from Doc and also would like for someone to be able to check in on her. Emailed pt the Patient Guide from [facility] for her review.  Closing referral pending any other needs of patient. K[facility] Follow up on: 01/25/2020 [place] . [email or web address]  [phone]

## 2020-02-02 ENCOUNTER — Ambulatory Visit (INDEPENDENT_AMBULATORY_CARE_PROVIDER_SITE_OTHER): Payer: PPO | Admitting: Internal Medicine

## 2020-02-02 ENCOUNTER — Other Ambulatory Visit: Payer: Self-pay

## 2020-02-02 ENCOUNTER — Encounter: Payer: Self-pay | Admitting: Internal Medicine

## 2020-02-02 VITALS — BP 116/80 | HR 75 | Temp 98.3°F | Ht 69.0 in | Wt 178.8 lb

## 2020-02-02 DIAGNOSIS — K862 Cyst of pancreas: Secondary | ICD-10-CM | POA: Diagnosis not present

## 2020-02-02 DIAGNOSIS — F419 Anxiety disorder, unspecified: Secondary | ICD-10-CM | POA: Diagnosis not present

## 2020-02-02 DIAGNOSIS — M810 Age-related osteoporosis without current pathological fracture: Secondary | ICD-10-CM

## 2020-02-02 NOTE — Pre-Procedure Instructions (Signed)
KAELYNNE CHRISTLEY  02/02/2020    Your procedure is scheduled on Tuesday, February 13, 2020 at 12 Noon.   Report to [facility] Entrance "A" Admitting Office at 10:00 AM.   Call this number if you have problems the morning of surgery: [phone]   Questions prior to day of surgery, please call [phone] between 8 & 4 PM.   Remember:  Do not eat food after midnight Monday, 02/12/20.   You may drink clear liquids until 9:00 AM.  Clear liquids allowed are: Water, Juice (non-citric and without pulp - diabetics please choose diet or no sugar options), Carbonated beverages - (diabetics please choose diet or no sugar options), Clear Tea, Black Coffee only (no creamer, milk or cream including half and half), Plain Jell-O only (diabetics please choose diet or no sugar options), Gatorade (diabetics please choose diet or no sugar options) and Plain Popsicles only   Drink 2 of the Pre-Surgery Ensure drinks the night before surgery at 10 PM. Drink the 3rd one the morning of surgery between 8:45 AM and 9:00 AM. This will be the last liquids you will drink prior to surgery.    Take these medicines the morning of surgery with A SIP OF WATER: Diltiazem (Cardizem), Duloxetine (Cymbalta), Lorazepam (Ativan), Levothyroxine (Synthroid), Gabapentin (Neurontin), Flecainide (Tambocar), Acetaminophen (Tylenol) - if needed.  Stop Eliquis as instructed by your physician/surgeon. Stop all Vitamins and Herbal medications 7 days prior to surgery. Do not use NSAIDS (Ibuprofen, Aleve, etc), Aspirin containing products or Fish Oil 7 days prior to surgery.    Do not wear jewelry, make-up or nail polish.  Do not wear lotions, powders, perfumes or deodorant.  Do not shave 48 hours prior to surgery.    Do not bring valuables to the hospital.  [facility] is not responsible for any belongings or valuables.  Contacts, dentures or bridgework may not be worn into surgery.  Leave your suitcase in the car.  After surgery it  may be brought to your room.  For patients admitted to the hospital, discharge time will be determined by your treatment team.  [facility] - Preparing for Surgery  Before surgery, you can play an important role.  Because skin is not sterile, your skin needs to be as free of germs as possible.  You can reduce the number of germs on you skin by washing with CHG (chlorahexidine gluconate) soap before surgery.  CHG is an antiseptic cleaner which kills germs and bonds with the skin to continue killing germs even after washing.  Oral Hygiene is also important in reducing the risk of infection.  Remember to brush your teeth with your regular toothpaste the morning of surgery.  Please DO NOT use if you have an allergy to CHG or antibacterial soaps.  If your skin becomes reddened/irritated stop using the CHG and inform your nurse when you arrive at Short Stay.  Do not shave (including legs and underarms) for at least 48 hours prior to the first CHG shower.  You may shave your face.  Please follow these instructions carefully:   1.  Shower with CHG Soap the night before surgery and the morning of Surgery.  2.  If you choose to wash your hair, wash your hair first as usual with your normal shampoo.  3.  After you shampoo, rinse your hair and body thoroughly to remove the shampoo. 4.  Use CHG as you would any other liquid soap.  You can apply chg directly to the  skin and wash gently with a      scrungie or washcloth.           5.  Apply the CHG Soap to your body ONLY FROM THE NECK DOWN.   Do not use on open wounds or open sores. Avoid contact with your eyes, ears, mouth and genitals (private parts).  Wash genitals (private parts) with your normal soap - do this prior to using CHG soap.  6.  Wash thoroughly, paying special attention to the area where your surgery will be performed.  7.  Thoroughly rinse your body with warm water from the neck down.  8.  DO NOT shower/wash with your normal soap after using  and rinsing off the CHG Soap.  9.  Pat yourself dry with a clean towel.            10.  Wear clean pajamas.            11.  Place clean sheets on your bed the night of your first shower and do not sleep with pets.  Day of Surgery  Shower as above.  Do not apply any lotions/deodorants the morning of surgery.   Please wear clean clothes to the hospital. Remember to brush your teeth with toothpaste.  Please read over the fact sheets that you were given.

## 2020-02-02 NOTE — Progress Notes (Signed)
Chief Complaint  Patient presents with  . Follow-up   F/u  1. Pancreatic cyst pt has anxiety about surgery 02/13/20 saw therapy osman he disc coping skills with her but she thinks she will cancel next appt she is nervous about upcoming surgery Dr. Barry Dienes 2. Osteoporosis prolia shot had 12/26/19 and upcoming due 06/27/20   Review of Systems  Constitutional: Negative for weight loss.  Respiratory: Negative for shortness of breath.   Cardiovascular: Negative for chest pain.  Gastrointestinal: Negative for abdominal pain.  Psychiatric/Behavioral: The patient is nervous/anxious.    Past Medical History:  Diagnosis Date  . Anxiety    controlled with meds  . Anxiety   . Arrhythmia   . Atrial fibrillation ([name])    paroxysmal, failed medical therapy with flecainide,  NSVT with tikosyn  . Bruises easily   . CVA (cerebral vascular accident) ([place]) 12/2007  . Depression    medically controlled   . Glaucoma    also with macular holes AE Dr. Thomasene Ripple  . Low blood pressure    no falls, but if gets up to fast  . RLS (restless legs syndrome)   . Stroke [facility]) 7 years ago   Lasting effects on balance, different personality.  . Thyroid disease   . Tremor   . 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 ATRIAL FIBRILLATION ABLATION  10/18/12   PVI by Dr Rayann Heman  . ATRIAL FIBRILLATION ABLATION N/A 10/18/2012   Procedure: ATRIAL FIBRILLATION ABLATION;  Surgeon: Thompson Grayer, MD;  Location: [facility] CATH LAB;  Service: Cardiovascular;  Laterality: N/A;  . ATRIAL FIBRILLATION ABLATION N/A 04/18/2019   Procedure: ATRIAL FIBRILLATION ABLATION;  Surgeon: Thompson Grayer, MD;  Location: [place] CV LAB;  Service: Cardiovascular;  Laterality: N/A;  . EUS N/A 10/19/2019   Procedure: FULL UPPER ENDOSCOPIC ULTRASOUND (EUS) RADIAL;  Surgeon: Holly Bodily, MD;  Location: [facility] ENDOSCOPY;  Service: Gastroenterology;  Laterality:  N/A;  . EYE SURGERY     on L/R eye, macular hole   . FRACTURE SURGERY    . implantable loop recorder placement  07/13/2019   MDT Reveal LINQ1 [facility] [id number]) implanted in office by Dr Rayann Heman for afib management post ablation  . OPEN REDUCTION INTERNAL FIXATION (ORIF) DISTAL RADIAL FRACTURE Right 11/15/2014   Procedure: OPEN REDUCTION INTERNAL FIXATION (ORIF) RIGHT DISTAL RADIAL FRACTURE WITH ALLOGRAFT BONE GRAFT;  Surgeon: Roseanne Kaufman, MD;  Location: [facility];  Service: Orthopedics;  Laterality: Right;  . TEE WITHOUT CARDIOVERSION N/A 10/18/2012   Procedure: TRANSESOPHAGEAL ECHOCARDIOGRAM (TEE);  Surgeon: Lelon Perla, MD;  Location[facility] ENDOSCOPY;  Service: Cardiovascular;  Laterality: N/A;  Pre-Ablation at 12pm  . TONSILLECTOMY    . VAGINAL HYSTERECTOMY     Family History  Problem Relation Age of Onset  . Multiple sclerosis Father   . Breast cancer Neg Hx    Social History   Socioeconomic History  . Marital status: Widowed    Spouse name: Not on file  . Number of children: 0  . Years of education: Not on file  . Highest education level: Not on file  Occupational History  . Occupation: Retired    Fish farm manager: RETIRED  Tobacco Use  . Smoking status: Never Smoker  . Smokeless tobacco: Never Used  Vaping Use  . Vaping Use: Never used  Substance and Sexual Activity  . Alcohol use: Yes    Alcohol/week:  1.0 standard drink    Types: 1 Standard drinks or equivalent per week    Comment: regular  . Drug use: No  . Sexual activity: Yes  Other Topics Concern  . Not on file  Social History Narrative   Lives in [place] with her husband.  No children 2 step kids.  Former Psychologist, prison and probation services   Enjoys exercise- water classes, yoga Editor, commissioning.    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      Has 1 cat lives alone no kids but see above    Social Determinants of  Health   Financial Resource Strain: Low Risk   . Difficulty of Paying Living Expenses: Not hard at all  Food Insecurity: No Food Insecurity  . Worried About Charity fundraiser in the Last Year: Never true  . Ran Out of Food in the Last Year: Never true  Transportation Needs: No Transportation Needs  . Lack of Transportation (Medical): No  . Lack of Transportation (Non-Medical): No  Physical Activity: Sufficiently Active  . Days of Exercise per Week: 7 days  . Minutes of Exercise per Session: 60 min  Stress: No Stress Concern Present  . Feeling of Stress : Only a little  Social Connections: Unknown  . Frequency of Communication with Friends and Family: Three times a week  . Frequency of Social Gatherings with Friends and Family: Once a week  . Attends Religious Services: Not on file  . Active Member of Clubs or Organizations: Not on file  . Attends Archivist Meetings: Not on file  . Marital Status: Widowed  Intimate Partner Violence: Not At Risk  . Fear of Current or Ex-Partner: No  . Emotionally Abused: No  . Physically Abused: No  . Sexually Abused: No   Current Meds  Medication Sig  . acetaminophen (TYLENOL) 500 MG tablet Take 1,000 mg by mouth every 6 (six) hours as needed (pain).   . B Complex-C (B-COMPLEX WITH VITAMIN C) tablet Take 1 tablet by mouth daily.  . Calcium Carb-Cholecalciferol (CALCIUM 600+D3 PO) Take 2 tablets by mouth daily.  . Cholecalciferol (VITAMIN D3) 50 MCG (2000 UT) TABS Take 4,000 Units by mouth daily.  Marland Kitchen CRANBERRY EXTRACT PO Take 2 tablets by mouth every evening.   . denosumab (PROLIA) 60 MG/ML SOLN injection Inject 60 mg into the skin every 6 (six) months.   . Dextran POWD by Does not apply route.  . diltiazem (CARDIZEM CD) 240 MG 24 hr capsule TAKE 1 CAPSULE (240 MG TOTAL) BY MOUTH DAILY. MAY TAKE EXTRA CAPSULE DAILY AS NEEDED FOR AFIB  . DULoxetine (CYMBALTA) 60 MG capsule TAKE 1 CAPSULE (60 MG TOTAL) BY MOUTH 2 (TWO) TIMES DAILY.  Marland Kitchen  ELIQUIS 5 MG TABS tablet TAKE 1 TABLET BY MOUTH TWICE A DAY (Patient taking differently: Take 5 mg by mouth in the morning and at bedtime. )  . estradiol (ESTRACE VAGINAL) 0.1 MG/GM vaginal cream Apply 0.5mg  (pea-sized amount)  just inside the vaginal introitus with a finger-tip on Monday, Wednesday and Friday nights.  . flecainide (TAMBOCOR) 100 MG tablet TAKE 1 TABLET BY MOUTH TWICE A DAY (Patient taking differently: Take 100 mg by mouth in the morning and at bedtime. )  . gabapentin (NEURONTIN) 100 MG capsule Take 2 capsules (200 mg total) by mouth 2 (two) times  daily.  . levothyroxine (SYNTHROID) 25 MCG tablet Take 1 tablet (25 mcg total) by mouth daily. 30 minutes before food (Patient taking differently: Take 25 mcg by mouth daily before breakfast. 30 minutes before food)  . LORazepam (ATIVAN) 0.5 MG tablet Take 1 tablet (0.5 mg total) by mouth every 8 (eight) hours.  . Melatonin 10 MG TABS Take 2.5-5 mg by mouth at bedtime as needed (sleep.).  Marland Kitchen Multiple Vitamin (MULTIVITAMIN WITH MINERALS) TABS tablet Take 1 tablet by mouth daily. One-A-Day  . pravastatin (PRAVACHOL) 20 MG tablet Take 1 tablet (20 mg total) by mouth at bedtime. (Patient taking differently: Take 10 mg by mouth at bedtime. )  . SALT SUBSTITUTES PO Take 1 tablet by mouth daily as needed (hypotension (prevention of syncope episode)).   Marland Kitchen timolol (TIMOPTIC) 0.5 % ophthalmic solution Place 1 drop into the left eye at bedtime.   . vitamin C (ASCORBIC ACID) 500 MG tablet Take 1,000 mg by mouth daily.   . vitamin E 400 UNIT capsule Take 400 Units by mouth daily.     Allergies  Allergen Reactions  . Darvon Other (See Comments)    Severe panic attacks  . Propoxyphene Other (See Comments)    GI Upset Severe panic attacks GI Upset  . Propoxyphene N-Acetaminophen Other (See Comments)    Severe panic attacks  . Levofloxacin Anxiety and Other (See Comments)    Causes panic attacks.   Recent Results (from the past 2160 hour(s))   CUP PACEART REMOTE DEVICE CHECK     Status: None   Collection Time: 11/15/19 11:35 PM  Result Value Ref Range   Date Time Interrogation Session [id number]    Pulse Generator Manufacturer MERM    Pulse Gen Model [id number] Reveal LINQ    Pulse Gen Serial Number [id number] [place] [facility] Name [facility]    Implantable Pulse Generator Type ICM/ILR    Implantable Pulse Generator Implant Date [id number]   CUP PACEART REMOTE DEVICE CHECK     Status: None   Collection Time: 12/16/19 11:41 PM  Result Value Ref Range   Date Time Interrogation Session [id number]    Pulse Generator Manufacturer MERM    Pulse Gen Model [id number] Reveal LINQ    Pulse Gen Serial Number [id number] [place] [facility] Name [facility]    Implantable Pulse Generator Type ICM/ILR    Implantable Pulse Generator Implant Date [id number]   CUP PACEART REMOTE DEVICE CHECK     Status: None   Collection Time: 01/21/20 11:23 PM  Result Value Ref Range   Date Time Interrogation Session [id number]    Pulse Generator Manufacturer MERM    Pulse Gen Model [id number] Reveal LINQ    Pulse Gen Serial Number [id number]    Clinic Name [facility]    Implantable Pulse Generator Type ICM/ILR    Implantable Pulse Generator Implant Date [id number]    Objective  Body mass index is 26.4 kg/m. Wt Readings from Last 3 Encounters:  02/02/20 178 lb 12.8 oz (81.1 kg)  12/15/19 167 lb (75.8 kg)  10/12/19 167 lb 9.6 oz (76 kg)   Temp Readings from Last 3 Encounters:  02/02/20 98.3 F (36.8 C) (Oral)  12/15/19 97.7 F (36.5 C) (Skin)  10/19/19 98.1 F (36.7 C) (Temporal)   BP Readings from Last 3 Encounters:  02/02/20 116/80  12/15/19 110/64  10/19/19 100/66   Pulse Readings from Last 3 Encounters:  02/02/20 75  12/15/19 81  10/19/19 66    Physical Exam  Vitals and nursing note reviewed.  Constitutional:      Appearance: Normal appearance. She is well-developed and well-groomed.  HENT:     Head: Normocephalic and atraumatic.   Eyes:     Conjunctiva/sclera: Conjunctivae normal.     Pupils: Pupils are equal, round, and reactive to light.  Cardiovascular:     Rate and Rhythm: Normal rate and regular rhythm.     Heart sounds: Normal heart sounds. No murmur heard.      Comments: Chronic lymphedema  Pulmonary:     Effort: Pulmonary effort is normal.     Breath sounds: Normal breath sounds.  Skin:    General: Skin is warm and dry.  Neurological:     General: No focal deficit present.     Mental Status: She is alert and oriented to person, place, and time. Mental status is at baseline.     Gait: Gait normal.  Psychiatric:        Attention and Perception: Attention and perception normal.        Mood and Affect: Affect normal. Mood is anxious.        Speech: Speech normal.        Behavior: Behavior normal. Behavior is cooperative.        Thought Content: Thought content normal.        Cognition and Memory: Cognition and memory normal.        Judgment: Judgment normal.     Assessment  Plan  Pancreatic cyst surg sch 02/13/20 Dr. Barry Dienes   Osteoporosis, unspecified osteoporosis type, unspecified pathological fracture presence prolia shot due 07/01/20  Anxiety  Cont meds  F/u Dr. Clovis Pu   HM Flu shot utd  utd prevnar  pna 24 utd Tdap utd  shingrix had 2/2 shots covid vx had 2/2   Pap out of age window mammo12/28/20 negativeordered repeat Colonoscopy had 03/06/14 diverticulosis  02/17/16 osteoporosis reordered another  dexa + osteoporosis next prolia shot due 07/01/20 at f/u  Skin [facility] derm pt saw 12/22/19 Aks face x 3 f/u 1 year Dr. Will Bonnet  Echo had 04/11/2019 ef 60-65% She is doing exercise PT at [facility] Dr. Allen Norris  Dental Dr. Sandrea Hughs will establish  Provider: Dr. Olivia Mackie McLean-Scocuzza-Internal Medicine

## 2020-02-06 ENCOUNTER — Encounter (HOSPITAL_COMMUNITY)
Admission: RE | Admit: 2020-02-06 | Discharge: 2020-02-06 | Disposition: A | Discharge status: Home / Self Care | Payer: PPO | Source: Ambulatory Visit | Attending: General Surgery | Admitting: General Surgery

## 2020-02-06 ENCOUNTER — Encounter (HOSPITAL_COMMUNITY): Payer: Self-pay

## 2020-02-06 ENCOUNTER — Other Ambulatory Visit: Payer: Self-pay

## 2020-02-06 DIAGNOSIS — Z01812 Encounter for preprocedural laboratory examination: Secondary | ICD-10-CM | POA: Insufficient documentation

## 2020-02-06 HISTORY — DX: Hypothyroidism, unspecified: E03.9

## 2020-02-06 HISTORY — DX: Other specified postprocedural states: Z98.890

## 2020-02-06 LAB — COMPREHENSIVE METABOLIC PANEL
ALT: 20 U/L (ref 0–44)
AST: 28 U/L (ref 15–41)
Albumin: 4.2 g/dL (ref 3.5–5.0)
Alkaline Phosphatase: 56 U/L (ref 38–126)
Anion gap: 11 (ref 5–15)
BUN: 25 mg/dL — ABNORMAL HIGH (ref 8–23)
CO2: 27 mmol/L (ref 22–32)
Calcium: 9.6 mg/dL (ref 8.9–10.3)
Chloride: 99 mmol/L (ref 98–111)
Creatinine, Ser: 0.63 mg/dL (ref 0.44–1.00)
GFR calc Af Amer: >60 mL/min (ref >60)
GFR calc non Af Amer: >60 mL/min (ref >60)
Glucose, Bld: 77 mg/dL (ref 70–99)
Potassium: 4.2 mmol/L (ref 3.5–5.1)
Sodium: 137 mmol/L (ref 135–145)
Total Bilirubin: 0.8 mg/dL (ref 0.3–1.2)
Total Protein: 7.5 g/dL (ref 6.5–8.1)

## 2020-02-06 LAB — CBC WITH DIFFERENTIAL/PLATELET
Abs Immature Granulocytes: 0.01 10*3/uL (ref 0.00–0.07)
Basophils Absolute: 0 10*3/uL (ref 0.0–0.1)
Basophils Relative: 1 %
Eosinophils Absolute: 0.3 10*3/uL (ref 0.0–0.5)
Eosinophils Relative: 5 %
HCT: 42.8 % (ref 36.0–46.0)
Hemoglobin: 13.8 g/dL (ref 12.0–15.0)
Immature Granulocytes: 0 %
Lymphocytes Relative: 34 %
Lymphs Abs: 2.1 10*3/uL (ref 0.7–4.0)
MCH: 30.6 pg (ref 26.0–34.0)
MCHC: 32.2 g/dL (ref 30.0–36.0)
MCV: 94.9 fL (ref 80.0–100.0)
Monocytes Absolute: 0.7 10*3/uL (ref 0.1–1.0)
Monocytes Relative: 11 %
Neutro Abs: 3 10*3/uL (ref 1.7–7.7)
Neutrophils Relative %: 49 %
Platelets: 290 10*3/uL (ref 150–400)
RBC: 4.51 MIL/uL (ref 3.87–5.11)
RDW: 14.4 % (ref 11.5–15.5)
WBC: 6.2 10*3/uL (ref 4.0–10.5)
nRBC: 0 % (ref 0.0–0.2)

## 2020-02-06 LAB — URINALYSIS, ROUTINE W REFLEX MICROSCOPIC
Bilirubin Urine: NEGATIVE
Glucose, UA: NEGATIVE mg/dL
Hgb urine dipstick: NEGATIVE
Ketones, ur: NEGATIVE mg/dL
Leukocytes,Ua: NEGATIVE
Nitrite: NEGATIVE
Protein, ur: NEGATIVE mg/dL
Specific Gravity, Urine: 1.012 (ref 1.005–1.030)
pH: 6 (ref 5.0–8.0)

## 2020-02-06 LAB — LIPASE, BLOOD: Lipase: 51 U/L (ref 11–51)

## 2020-02-06 LAB — TYPE AND SCREEN
ABO/RH(D): A POS
Antibody Screen: NEGATIVE

## 2020-02-06 NOTE — Progress Notes (Signed)
PCP - Olivia Mackie McLean-Scocuzza Cardiologist - Dr. Rayann Heman  Patient has loop recorder  Chest x-ray - n/a EKG - 07-13-19 ECHO - 04-11-19   Blood Thinner Instructions: Follow your surgeon's instructions on when to stop Eliquis.  Patient instructed to call Dr. Marlowe Aschoff office for instructions.  Patient stated understanding.   ERAS Protcol - yes, (3) Ensures given   COVID TEST- Friday 02-09-20 @ [facility]   Anesthesia review: yes, heart history  Patient denies shortness of breath, fever, cough and chest pain at PAT appointment   All instructions explained to the patient, with a verbal understanding of the material. Patient agrees to go over the instructions while at home for a better understanding. Patient also instructed to self quarantine after being tested for COVID-19. The opportunity to ask questions was provided.

## 2020-02-06 NOTE — Pre-Procedure Instructions (Signed)
Monica Whitaker  02/06/2020    Your procedure is scheduled on Tuesday, February 13, 2020 at 12 Noon.   Report to [facility] Entrance "A" Admitting Office at 10:00 AM.   Call this number if you have problems the morning of surgery: [phone]   Questions prior to day of surgery, please call [phone] between 8 & 4 PM.   Remember:  Do not eat food after midnight Monday, 02/12/20.   You may drink clear liquids until 9:00 AM.  Clear liquids allowed are: Water, Juice (non-citric and without pulp - diabetics please choose diet or no sugar options), Carbonated beverages - (diabetics please choose diet or no sugar options), Clear Tea, Black Coffee only (no creamer, milk or cream including half and half), Plain Jell-O only (diabetics please choose diet or no sugar options), Gatorade (diabetics please choose diet or no sugar options) and Plain Popsicles only   Drink 2 of the Pre-Surgery Ensure drinks the night before surgery at 10 PM. Drink the 3rd one the morning of surgery between 8:45 AM and 9:00 AM. This will be the last liquids you will drink prior to surgery.    Take these medicines the morning of surgery with A SIP OF WATER:   Diltiazem (Cardizem)  Duloxetine (Cymbalta)  Lorazepam (Ativan)  Levothyroxine (Synthroid)  Gabapentin (Neurontin)  Flecainide (Tambocar)  Acetaminophen (Tylenol) - if needed  Stop Eliquis as instructed by your physician/surgeon. Stop all Vitamins and Herbal medications 7 days prior to surgery. Do not use NSAIDS (Ibuprofen, Aleve, etc), Aspirin containing products or Fish Oil 7 days prior to surgery.    Do not wear jewelry, make-up or nail polish.  Do not wear lotions, powders, perfumes or deodorant.  Do not shave 48 hours prior to surgery.    Do not bring valuables to the hospital.  [facility] is not responsible for any belongings or valuables.  Contacts, dentures or bridgework may not be worn into surgery.  Leave your suitcase in the car.  After  surgery it may be brought to your room.  For patients admitted to the hospital, discharge time will be determined by your treatment team.  [facility] - Preparing for Surgery  Before surgery, you can play an important role.  Because skin is not sterile, your skin needs to be as free of germs as possible.  You can reduce the number of germs on you skin by washing with CHG (chlorahexidine gluconate) soap before surgery.  CHG is an antiseptic cleaner which kills germs and bonds with the skin to continue killing germs even after washing.  Oral Hygiene is also important in reducing the risk of infection.  Remember to brush your teeth with your regular toothpaste the morning of surgery.  Please DO NOT use if you have an allergy to CHG or antibacterial soaps.  If your skin becomes reddened/irritated stop using the CHG and inform your nurse when you arrive at Short Stay.  Do not shave (including legs and underarms) for at least 48 hours prior to the first CHG shower.  You may shave your face.  Please follow these instructions carefully:   1.  Shower with CHG Soap the night before surgery and the morning of Surgery.  2.  If you choose to wash your hair, wash your hair first as usual with your normal shampoo.  3.  After you shampoo, rinse your hair and body thoroughly to remove the shampoo. 4.  Use CHG as you would any other liquid soap.  You can apply chg directly to the skin and wash gently with a      scrungie or washcloth.           5.  Apply the CHG Soap to your body ONLY FROM THE NECK DOWN.   Do not use on open wounds or open sores. Avoid contact with your eyes, ears, mouth and genitals (private parts).  Wash genitals (private parts) with your normal soap - do this prior to using CHG soap.  6.  Wash thoroughly, paying special attention to the area where your surgery will be performed.  7.  Thoroughly rinse your body with warm water from the neck down.  8.  DO NOT shower/wash with your normal soap  after using and rinsing off the CHG Soap.  9.  Pat yourself dry with a clean towel.            10.  Wear clean pajamas.            11.  Place clean sheets on your bed the night of your first shower and do not sleep with pets.  Day of Surgery  Shower as above.  Do not apply any lotions/deodorants the morning of surgery.   Please wear clean clothes to the hospital. Remember to brush your teeth with toothpaste.  Please read over the fact sheets that you were given.

## 2020-02-07 ENCOUNTER — Ambulatory Visit: Payer: PPO | Admitting: Psychologist

## 2020-02-07 NOTE — Progress Notes (Signed)
Anesthesia Chart Review:  Follows with EP cardiologist Dr. Rayann Heman for history of paroxysmal atrial fibrillation status post ablation 04/18/2019.  Per last note 07/13/2019 she was doing well post ablation.  She remains on diltiazem and flecainide.  She remains on anticoagulation with Eliquis.  At that appointment a loop recorder was placed for long-term arrhythmia monitoring.  Most recent report 01/21/2020 showed no arrhythmias.  Echo 04/11/2019 showed EF 60-65%, normal valves.  Patient stated she was not sure what to do with Eliquis. I called Dr. Marlowe Aschoff office and they will call pt and advise LD on 02/08/20.  Preop labs reviewed, unremarkable.  EKG 07/13/2019: Sinus rhythm.  Rate 64.  TTE 04/11/2019: 1. The left ventricle has normal systolic function with an ejection  fraction of 60-65%. The cavity size was mildly dilated. Left ventricular  diastolic parameters were normal.  2. The right ventricle has normal systolic function. The cavity was  normal. There is no increase in right ventricular wall thickness. Right  ventricular systolic pressure is normal with an estimated pressure of 28.3  mmHg.  3. The mitral valve is grossly normal.  4. The tricuspid valve is grossly normal.  5. The aortic valve is grossly normal. Aortic valve regurgitation was not  assessed by color flow Doppler.  6. The aorta is normal unless otherwise noted.    [name] [facility]/Anesthesiology Phone [phone] 02/07/2020 4:33 PM

## 2020-02-07 NOTE — Anesthesia Preprocedure Evaluation (Addendum)
Anesthesia Evaluation  Patient identified by MRN, date of birth, ID band Patient awake    Reviewed: Allergy & Precautions, NPO status , Patient's Chart, lab work & pertinent test results  Airway Mallampati: II  TM Distance: >3 FB Neck ROM: Full    Dental no notable dental hx. (+) Teeth Intact, Dental Advisory Given   Pulmonary sleep apnea ,  Mild OSA- does not use CPAP   Pulmonary exam normal breath sounds clear to auscultation       Cardiovascular Normal cardiovascular exam+ dysrhythmias Atrial Fibrillation and Ventricular Tachycardia  Rhythm:Regular Rate:Normal  Echo 04/2019: 1. The left ventricle has normal systolic function with an ejection  fraction of 60-65%. The cavity size was mildly dilated. Left ventricular  diastolic parameters were normal.  2. The right ventricle has normal systolic function. The cavity was  normal. There is no increase in right ventricular wall thickness. Right  ventricular systolic pressure is normal with an estimated pressure of 28.3  mmHg.  3. The mitral valve is grossly normal.  4. The tricuspid valve is grossly normal.  5. The aortic valve is grossly normal. Aortic valve regurgitation was not  assessed by color flow Doppler.  6. The aorta is normal unless otherwise noted.   Follows with EP cardiologist Dr. Rayann Heman for history of paroxysmal atrial fibrillation status post ablation 04/18/2019   LD eliquis- 02/08/20   Neuro/Psych PSYCHIATRIC DISORDERS Anxiety Depression RLS, tremor CVA- balance issues  CVA, Residual Symptoms    GI/Hepatic Neg liver ROS, Cystic pancreatic mass   Endo/Other  Hypothyroidism   Renal/GU negative Renal ROS  negative genitourinary   Musculoskeletal negative musculoskeletal ROS (+)   Abdominal Normal abdominal exam  (+)   Peds  Hematology negative hematology ROS (+)   Anesthesia Other Findings   Reproductive/Obstetrics negative OB ROS                             Anesthesia Physical Anesthesia Plan  ASA: III  Anesthesia Plan: General and Regional   Post-op Pain Management: GA combined w/ Regional for post-op pain   Induction: Intravenous  PONV Risk Score and Plan: 4 or greater and Ondansetron, Dexamethasone, Midazolam and Treatment may vary due to age or medical condition  Airway Management Planned: Oral ETT  Additional Equipment: None  Intra-op Plan:   Post-operative Plan: Extubation in OR  Informed Consent: I have reviewed the patients History and Physical, chart, labs and discussed the procedure including the risks, benefits and alternatives for the proposed anesthesia with the patient or authorized representative who has indicated his/her understanding and acceptance.     Dental advisory given  Plan Discussed with: CRNA  Anesthesia Plan Comments: (2 large bore PIVs, possible arterial line intraoperatively, possibility of blood transfusion. L TAP block)     Anesthesia Quick Evaluation

## 2020-02-09 ENCOUNTER — Other Ambulatory Visit (HOSPITAL_COMMUNITY): Payer: PPO

## 2020-02-09 ENCOUNTER — Other Ambulatory Visit: Payer: Self-pay

## 2020-02-09 ENCOUNTER — Other Ambulatory Visit
Admission: RE | Admit: 2020-02-09 | Discharge: 2020-02-09 | Disposition: A | Discharge status: Home / Self Care | Payer: PPO | Source: Ambulatory Visit | Attending: General Surgery | Admitting: General Surgery

## 2020-02-09 DIAGNOSIS — Z01812 Encounter for preprocedural laboratory examination: Secondary | ICD-10-CM | POA: Diagnosis not present

## 2020-02-09 DIAGNOSIS — Z20822 Contact with and (suspected) exposure to covid-19: Secondary | ICD-10-CM | POA: Insufficient documentation

## 2020-02-10 LAB — SARS CORONAVIRUS 2 (TAT 6-24 HRS): SARS Coronavirus 2: NEGATIVE

## 2020-02-12 NOTE — H&P (Signed)
Monica Whitaker Location: [facility] Patient #: [id number] DOB: 29-Apr-1946 Widowed / Language: [name] / Race: White Female   History of Present Illness  The patient is a 74 year old female who presents with a pancreatic mass.Pt is a lovely 74 yo F referred by Dr. Allen Norris of [facility] gastroenterology for a new diagnosis of a pancreatic cyst. She had a CT in october 2020 that showed cystic lesions in her pancreas. She then underwent MR/MRCP in november that confirmed these lesions, but wasn't as helpful and thought. Dr. Mont Dutton from [place] comes to [place] periodically and performed EUS on her in March. This was much more definitive showing a multicystic mass in the body of the pancreas that communicated with the main duct. No mass or debris was seen in the lesion. The distal pancreatic duct was dilated downstream of the multicystic lesion. cytology was positive for a mucinous lesion. She has had a bit of weight loss, but she also has been eating less with the pandemic. She is working on walking more as well as she had some mild residual weakness of her right thigh and hip following a CVA. She has no personal or family history of cancer of which she is aware.   EUS 10/19/2019 ([facility]) EUS Impressions: - A dominant multicystic lesion was seen in the pancreatic body. Taken into account with all the pancreatic findings, the endosonographic appearance is highly suspicious for a mixed intraductal papillary mucinous neoplasm. Fine needle aspiration for cytology and cyst fluid analysis (CEA/amylase) performed. - Multiple other cystic lesions were seen in the pancreatic head and pancreatic tail. Tissue has not been obtained. However, the endosonographic appearance is suggestive of a mixed intraductal papillary mucinous neoplasm. - The pancreatic duct had a dilated endosonographic appearance in the tail of the pancreas. The pancreatic duct measured up to 8 mm in diameter. The  duct was more normal throughout the pancreas. No obvious mass seen at site of caliber change. - There was no sign of significant pathology in the common bile duct. - No lymphadenopathy visualized. - Normal visualized portions of the liver. - Normal celiac region.  cytology 10/19/2019 DIAGNOSIS: A. PANCREATIC BODY, CYST; EUS GUIDED FINE-NEEDLE ASPIRATION: - MUCINOUS NEOPLASM, SEE COMMENT     Past Surgical History  Cataract Surgery  Bilateral. Hysterectomy (not due to cancer) - Complete  Tonsillectomy   Diagnostic Studies History Colonoscopy  5-10 years ago Mammogram  within last year Pap Smear  >5 years ago  Allergies  No Known Drug Allergies  Allergies Reconciled   Medication History Flecainide Acetate (100MG  Tablet, Oral) Active. Eliquis (5MG  Tablet, Oral) Active. dilTIAZem HCl ER Coated Beads (240MG  Capsule ER 24HR, Oral) Active. Levothyroxine Sodium (25MCG Tablet, Oral) Active. Amphetamine-Dextroamphet ER (10MG  Capsule ER 24HR, Oral) Active. Gabapentin (100MG  Capsule, Oral) Active. Pantoprazole Sodium (40MG  Tablet DR, Oral) Active. Pravastatin Sodium (20MG  Tablet, Oral) Active. Cymbalta (60MG  Capsule DR Part, Oral) Active. Medications Reconciled  Social History Alcohol use  Moderate alcohol use. Caffeine use  Coffee. Illicit drug use  Uses socially only. Tobacco use  Never smoker.  Family History  Depression  Mother. Thyroid problems  Mother.  Pregnancy / Birth History Age at menarche  75 years. Contraceptive History  Oral contraceptives.  Other Problems Anxiety Disorder  Atrial Fibrillation  Cerebrovascular Accident  Depression  Hypercholesterolemia  Oophorectomy  Bilateral. Pulmonary Embolism / Blood Clot in Legs  Thyroid Disease     Review of Systems  General Present- Fatigue, Weight Gain and Weight Loss. Not Present-  Appetite Loss, Chills, Fever and Night Sweats. Skin Not Present- Change in Wart/Mole,  Dryness, Hives, Jaundice, New Lesions, Non-Healing Wounds, Rash and Ulcer. HEENT Present- Ringing in the Ears, Visual Disturbances and Wears glasses/contact lenses. Not Present- Earache, Hearing Loss, Hoarseness, Nose Bleed, Oral Ulcers, Seasonal Allergies, Sinus Pain, Sore Throat and Yellow Eyes. Respiratory Present- Snoring. Not Present- Bloody sputum, Chronic Cough, Difficulty Breathing and Wheezing. Breast Not Present- Breast Mass, Breast Pain, Nipple Discharge and Skin Changes. Cardiovascular Present- Palpitations, Rapid Heart Rate and Swelling of Extremities. Not Present- Chest Pain, Difficulty Breathing Lying Down, Leg Cramps and Shortness of Breath. Gastrointestinal Present- Abdominal Pain, Change in Bowel Habits and Constipation. Not Present- Bloating, Bloody Stool, Chronic diarrhea, Difficulty Swallowing, Excessive gas, Gets full quickly at meals, Hemorrhoids, Indigestion, Nausea, Rectal Pain and Vomiting. Female Genitourinary Present- Urgency. Not Present- Frequency, Nocturia, Painful Urination and Pelvic Pain. Musculoskeletal Present- Back Pain, Joint Pain, Joint Stiffness, Muscle Pain, Muscle Weakness and Swelling of Extremities. Neurological Present- Decreased Memory, Headaches, Numbness, Tingling, Trouble walking and Weakness. Not Present- Fainting, Seizures and Tremor. Psychiatric Present- Anxiety, Change in Sleep Pattern, Depression and Fearful. Not Present- Bipolar and Frequent crying. Endocrine Present- Hair Changes. Not Present- Cold Intolerance, Excessive Hunger, Heat Intolerance, Hot flashes and New Diabetes. Hematology Present- Blood Thinners. Not Present- Easy Bruising, Excessive bleeding, Gland problems, HIV and Persistent Infections.    Vitals Weight: 165.2 lb Height: 87in Body Surface Area: 2.25 m Body Mass Index: 15.35 kg/m  Temp.: 72F  Pulse: 87 (Regular)  BP: 128/78(Sitting, Left Arm, Standard)  Physical Exam  General Mental Status-Alert. General  Appearance-Consistent with stated age. Hydration-Well hydrated. Voice-Normal.  Head and Neck Head-normocephalic, atraumatic with no lesions or palpable masses. Trachea-midline. Thyroid Gland Characteristics - normal size and consistency.  Eye Eyeball - Bilateral-Extraocular movements intact. Sclera/Conjunctiva - Bilateral-No scleral icterus.  Chest and Lung Exam Chest and lung exam reveals -quiet, even and easy respiratory effort with no use of accessory muscles and on auscultation, normal breath sounds, no adventitious sounds and normal vocal resonance. Inspection Chest Wall - Normal. Back - normal.  Cardiovascular Cardiovascular examination reveals -normal heart sounds, regular rate and rhythm with no murmurs and normal pedal pulses bilaterally.  Abdomen Inspection Inspection of the abdomen reveals - No Hernias. Palpation/Percussion Palpation and Percussion of the abdomen reveal - Soft, Non Tender, No Rebound tenderness, No Rigidity (guarding) and No hepatosplenomegaly. Auscultation Auscultation of the abdomen reveals - Bowel sounds normal.  Neurologic Neurologic evaluation reveals -alert and oriented x 3 with no impairment of recent or remote memory. Mental Status-Normal.  Musculoskeletal Global Assessment -Note: no gross deformities.  Normal Exam - Left-Upper Extremity Strength Normal and Lower Extremity Strength Normal. Normal Exam - Right-Upper Extremity Strength Normal and Lower Extremity Strength Normal.  Lymphatic Head & Neck  General Head & Neck Lymphatics: Bilateral - Description - Normal. Axillary  General Axillary Region: Bilateral - Description - Normal. Tenderness - Non Tender. Femoral & Inguinal  Generalized Femoral & Inguinal Lymphatics: Bilateral - Description - No Generalized lymphadenopathy.    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 This one communicates with the main duct and obstructs the distal duct. I will plan lap distal pancreatectomy with possible splenectomy.  I reviewed surgery with the patient. I discussed risks, benefits. I reviewed risk of bleeding, infection, damage to adjacent structures, pancreatic leak, heart/lung issues,  blood clot, prolonged time in the hospital, and possible need for other procedures. We discussed risk of prolonged drain as well and recovery time.  She lives at [facility]. They have a nurse there that can help with home health and check on her.  I reviewed that I may have to take the spleen because of anatomical issues, bleeding, or if cancer is present.  She understands and wishes to proceed. She is accompanied by a friend. Current Plans You are being scheduled for surgery- Our schedulers will call you.  You should hear from our office's scheduling department within 5 working days about the location, date, and time of surgery. We try to make accommodations for patient's preferences in scheduling surgery, but sometimes the OR schedule or the surgeon's schedule prevents [place] from making those accommodations.  If you have not heard from our office [phone]) in 5 working days, call the office and ask for your surgeon's nurse.  If you have other questions about your diagnosis, plan, or surgery, call the office and ask for your surgeon's nurse.  Pt Education - FB pancreatectomy CT ABDOMEN WO/W CON (19166) (pancreatic protocol. evaluate pancreatic body lesion.) ATRIAL FIBRILLATION (I48.91) Impression: I have contacted Dr. Rayann Heman to see if any additional testing is needed. ANTICOAGULATED BY ANTICOAGULATION TREATMENT (Z79.01) Impression: Discussed increased risk of bleeding.  Addendum Note(April Staton CMA; 12/22/2019 8:58 AM) The use of home mechanical compression devices is a  proven alternative which safely decreases the rate of post-surgical DVT (Bartlett MA, Mauck KF, Daniels PR. Prevention of venous thromboembolism in patients undergoing bariatric surgery. [place]. 2015; 06:004-599. Published 2015 Aug 17. doi:10.2147/VHRM.S73799) and should be approved without delay for this patient.  Order: Location manager Device for home use.

## 2020-02-13 ENCOUNTER — Inpatient Hospital Stay (HOSPITAL_COMMUNITY): Payer: PPO | Admitting: Anesthesiology

## 2020-02-13 ENCOUNTER — Encounter (HOSPITAL_COMMUNITY)
Admission: RE | Disposition: A | Discharge status: Home / Self Care | Payer: PPO | Source: Home / Self Care | Attending: General Surgery

## 2020-02-13 ENCOUNTER — Inpatient Hospital Stay (HOSPITAL_COMMUNITY): Payer: PPO | Admitting: Physician Assistant

## 2020-02-13 ENCOUNTER — Inpatient Hospital Stay (HOSPITAL_COMMUNITY)
Admission: RE | Admit: 2020-02-13 | Discharge: 2020-02-17 | DRG: 406 | Disposition: A | Discharge status: Home / Self Care | Payer: PPO | Attending: Surgery | Admitting: Surgery

## 2020-02-13 ENCOUNTER — Other Ambulatory Visit: Payer: Self-pay

## 2020-02-13 DIAGNOSIS — K8689 Other specified diseases of pancreas: Secondary | ICD-10-CM | POA: Diagnosis present

## 2020-02-13 DIAGNOSIS — I4891 Unspecified atrial fibrillation: Secondary | ICD-10-CM | POA: Diagnosis not present

## 2020-02-13 DIAGNOSIS — Z90710 Acquired absence of both cervix and uterus: Secondary | ICD-10-CM | POA: Diagnosis not present

## 2020-02-13 DIAGNOSIS — Z79899 Other long term (current) drug therapy: Secondary | ICD-10-CM

## 2020-02-13 DIAGNOSIS — R102 Pelvic and perineal pain: Secondary | ICD-10-CM | POA: Diagnosis not present

## 2020-02-13 DIAGNOSIS — R739 Hyperglycemia, unspecified: Secondary | ICD-10-CM | POA: Diagnosis not present

## 2020-02-13 DIAGNOSIS — M6281 Muscle weakness (generalized): Secondary | ICD-10-CM | POA: Diagnosis not present

## 2020-02-13 DIAGNOSIS — Z79890 Hormone replacement therapy (postmenopausal): Secondary | ICD-10-CM

## 2020-02-13 DIAGNOSIS — C259 Malignant neoplasm of pancreas, unspecified: Secondary | ICD-10-CM | POA: Diagnosis not present

## 2020-02-13 DIAGNOSIS — Z8673 Personal history of transient ischemic attack (TIA), and cerebral infarction without residual deficits: Secondary | ICD-10-CM | POA: Diagnosis not present

## 2020-02-13 DIAGNOSIS — G250 Essential tremor: Secondary | ICD-10-CM | POA: Diagnosis present

## 2020-02-13 DIAGNOSIS — G8918 Other acute postprocedural pain: Secondary | ICD-10-CM | POA: Diagnosis not present

## 2020-02-13 DIAGNOSIS — I4819 Other persistent atrial fibrillation: Secondary | ICD-10-CM | POA: Diagnosis not present

## 2020-02-13 DIAGNOSIS — I69341 Monoplegia of lower limb following cerebral infarction affecting right dominant side: Secondary | ICD-10-CM | POA: Diagnosis not present

## 2020-02-13 DIAGNOSIS — R2681 Unsteadiness on feet: Secondary | ICD-10-CM | POA: Diagnosis not present

## 2020-02-13 DIAGNOSIS — E039 Hypothyroidism, unspecified: Secondary | ICD-10-CM | POA: Diagnosis not present

## 2020-02-13 DIAGNOSIS — G8929 Other chronic pain: Secondary | ICD-10-CM | POA: Diagnosis not present

## 2020-02-13 DIAGNOSIS — Z90410 Acquired total absence of pancreas: Secondary | ICD-10-CM | POA: Diagnosis not present

## 2020-02-13 DIAGNOSIS — E785 Hyperlipidemia, unspecified: Secondary | ICD-10-CM | POA: Diagnosis not present

## 2020-02-13 DIAGNOSIS — Z7901 Long term (current) use of anticoagulants: Secondary | ICD-10-CM | POA: Diagnosis not present

## 2020-02-13 DIAGNOSIS — Z483 Aftercare following surgery for neoplasm: Secondary | ICD-10-CM | POA: Diagnosis not present

## 2020-02-13 DIAGNOSIS — K862 Cyst of pancreas: Secondary | ICD-10-CM | POA: Diagnosis not present

## 2020-02-13 DIAGNOSIS — R2689 Other abnormalities of gait and mobility: Secondary | ICD-10-CM | POA: Diagnosis not present

## 2020-02-13 DIAGNOSIS — G4733 Obstructive sleep apnea (adult) (pediatric): Secondary | ICD-10-CM | POA: Diagnosis not present

## 2020-02-13 DIAGNOSIS — D378 Neoplasm of uncertain behavior of other specified digestive organs: Secondary | ICD-10-CM | POA: Diagnosis not present

## 2020-02-13 DIAGNOSIS — R52 Pain, unspecified: Secondary | ICD-10-CM | POA: Diagnosis not present

## 2020-02-13 DIAGNOSIS — F419 Anxiety disorder, unspecified: Secondary | ICD-10-CM | POA: Diagnosis not present

## 2020-02-13 DIAGNOSIS — Q453 Other congenital malformations of pancreas and pancreatic duct: Secondary | ICD-10-CM | POA: Diagnosis not present

## 2020-02-13 DIAGNOSIS — D136 Benign neoplasm of pancreas: Secondary | ICD-10-CM | POA: Diagnosis not present

## 2020-02-13 DIAGNOSIS — G2581 Restless legs syndrome: Secondary | ICD-10-CM | POA: Diagnosis not present

## 2020-02-13 DIAGNOSIS — F329 Major depressive disorder, single episode, unspecified: Secondary | ICD-10-CM | POA: Diagnosis not present

## 2020-02-13 HISTORY — PX: PANCREATECTOMY: SHX5261

## 2020-02-13 LAB — CBC
HCT: 38.6 % (ref 36.0–46.0)
Hemoglobin: 12.5 g/dL (ref 12.0–15.0)
MCH: 30.3 pg (ref 26.0–34.0)
MCHC: 32.4 g/dL (ref 30.0–36.0)
MCV: 93.7 fL (ref 80.0–100.0)
Platelets: 263 10*3/uL (ref 150–400)
RBC: 4.12 MIL/uL (ref 3.87–5.11)
RDW: 14.4 % (ref 11.5–15.5)
WBC: 11.3 10*3/uL — ABNORMAL HIGH (ref 4.0–10.5)
nRBC: 0 % (ref 0.0–0.2)

## 2020-02-13 LAB — CREATININE, SERUM
Creatinine, Ser: 0.64 mg/dL (ref 0.44–1.00)
GFR calc Af Amer: >60 mL/min (ref >60)
GFR calc non Af Amer: >60 mL/min (ref >60)

## 2020-02-13 LAB — ABO/RH: ABO/RH(D): A POS

## 2020-02-13 SURGERY — PANCREATECTOMY, LAPAROSCOPIC
Anesthesia: Regional | Site: Abdomen

## 2020-02-13 MED ORDER — CHLORHEXIDINE GLUCONATE CLOTH 2 % EX PADS: 6.0000 | MEDICATED_PAD | Freq: Once | CUTANEOUS | Status: DC

## 2020-02-13 MED ORDER — SODIUM CHLORIDE 0.9 % IR SOLN
Status: DC | PRN
  Administered 2020-02-13: 1000 mL

## 2020-02-13 MED ORDER — LEVOTHYROXINE SODIUM 25 MCG PO TABS
25.0000 ug | ORAL_TABLET | Freq: Every day | ORAL | Status: DC
  Administered 2020-02-14 – 2020-02-17 (×4): 25 ug via ORAL
  Filled 2020-02-13 (×4): qty 1

## 2020-02-13 MED ORDER — HYDROMORPHONE HCL 1 MG/ML IJ SOLN: 0.2500 mg | INTRAMUSCULAR | Status: DC | PRN

## 2020-02-13 MED ORDER — PROPOFOL 10 MG/ML IV BOLUS
INTRAVENOUS | Status: DC | PRN
  Administered 2020-02-13: 120 mg via INTRAVENOUS

## 2020-02-13 MED ORDER — KCL IN DEXTROSE-NACL 20-5-0.45 MEQ/L-%-% IV SOLN
INTRAVENOUS | Status: DC
  Filled 2020-02-13 (×5): qty 1000

## 2020-02-13 MED ORDER — FENTANYL CITRATE (PF) 250 MCG/5ML IJ SOLN
INTRAMUSCULAR | Status: DC | PRN
  Administered 2020-02-13: 100 ug via INTRAVENOUS
  Administered 2020-02-13: 50 ug via INTRAVENOUS

## 2020-02-13 MED ORDER — PRAVASTATIN SODIUM 10 MG PO TABS
10.0000 mg | ORAL_TABLET | Freq: Every day | ORAL | Status: DC
  Administered 2020-02-13 – 2020-02-16 (×4): 10 mg via ORAL
  Filled 2020-02-13 (×4): qty 1

## 2020-02-13 MED ORDER — MORPHINE SULFATE (PF) 2 MG/ML IV SOLN
1.0000 mg | INTRAVENOUS | Status: DC | PRN
  Administered 2020-02-13: 1 mg via INTRAVENOUS
  Administered 2020-02-13 – 2020-02-16 (×2): 2 mg via INTRAVENOUS
  Filled 2020-02-13 (×3): qty 1

## 2020-02-13 MED ORDER — PHENYLEPHRINE HCL-NACL 10-0.9 MG/250ML-% IV SOLN
INTRAVENOUS | Status: DC | PRN
Start: 2020-02-13 — End: 2020-02-13
  Administered 2020-02-13: 80 ug/min via INTRAVENOUS

## 2020-02-13 MED ORDER — CEFAZOLIN SODIUM-DEXTROSE 2-4 GM/100ML-% IV SOLN
2.0000 g | Freq: Three times a day (TID) | INTRAVENOUS | Status: AC
  Administered 2020-02-13: 2 g via INTRAVENOUS
  Filled 2020-02-13: qty 100

## 2020-02-13 MED ORDER — SCOPOLAMINE 1 MG/3DAYS TD PT72: 1.0000 | MEDICATED_PATCH | TRANSDERMAL | Status: DC

## 2020-02-13 MED ORDER — SENNA 8.6 MG PO TABS
1.0000 | ORAL_TABLET | Freq: Two times a day (BID) | ORAL | Status: DC
  Administered 2020-02-13 – 2020-02-17 (×8): 8.6 mg via ORAL
  Filled 2020-02-13 (×8): qty 1

## 2020-02-13 MED ORDER — TIMOLOL MALEATE 0.5 % OP SOLN
1.0000 [drp] | Freq: Every day | OPHTHALMIC | Status: DC
  Administered 2020-02-13 – 2020-02-16 (×4): 1 [drp] via OPHTHALMIC
  Filled 2020-02-13: qty 5

## 2020-02-13 MED ORDER — ONDANSETRON HCL 4 MG/2ML IJ SOLN
INTRAMUSCULAR | Status: DC | PRN
  Administered 2020-02-13: 4 mg via INTRAVENOUS

## 2020-02-13 MED ORDER — PHENYLEPHRINE 40 MCG/ML (10ML) SYRINGE FOR IV PUSH (FOR BLOOD PRESSURE SUPPORT)
PREFILLED_SYRINGE | INTRAVENOUS | Status: DC | PRN
  Administered 2020-02-13 (×2): 80 ug via INTRAVENOUS
  Administered 2020-02-13: 40 ug via INTRAVENOUS
  Administered 2020-02-13: 80 ug via INTRAVENOUS

## 2020-02-13 MED ORDER — ENSURE PRE-SURGERY PO LIQD: 296.0000 mL | Freq: Once | ORAL | Status: DC

## 2020-02-13 MED ORDER — ONDANSETRON HCL 4 MG/2ML IJ SOLN: 4.0000 mg | Freq: Once | INTRAMUSCULAR | Status: DC | PRN

## 2020-02-13 MED ORDER — DIPHENHYDRAMINE HCL 12.5 MG/5ML PO ELIX: 12.5000 mg | ORAL_SOLUTION | Freq: Four times a day (QID) | ORAL | Status: DC | PRN

## 2020-02-13 MED ORDER — STERILE WATER FOR IRRIGATION IR SOLN
Status: DC | PRN
  Administered 2020-02-13: 1000 mL

## 2020-02-13 MED ORDER — BUPIVACAINE HCL (PF) 0.25 % IJ SOLN
INTRAMUSCULAR | Status: DC | PRN
  Administered 2020-02-13: 6.5 mL

## 2020-02-13 MED ORDER — GABAPENTIN 100 MG PO CAPS
200.0000 mg | ORAL_CAPSULE | Freq: Two times a day (BID) | ORAL | Status: DC
  Administered 2020-02-13 – 2020-02-15 (×4): 200 mg via ORAL
  Filled 2020-02-13 (×4): qty 2

## 2020-02-13 MED ORDER — MIDAZOLAM HCL 2 MG/2ML IJ SOLN
INTRAMUSCULAR | Status: AC
  Filled 2020-02-13: qty 2

## 2020-02-13 MED ORDER — SCOPOLAMINE 1 MG/3DAYS TD PT72
MEDICATED_PATCH | TRANSDERMAL | Status: AC
  Administered 2020-02-13: 1.5 mg via TRANSDERMAL
  Filled 2020-02-13: qty 1

## 2020-02-13 MED ORDER — LACTATED RINGERS IV SOLN: INTRAVENOUS | Status: DC | PRN

## 2020-02-13 MED ORDER — MELATONIN 5 MG PO TABS: 5.0000 mg | ORAL_TABLET | Freq: Every evening | ORAL | Status: DC | PRN

## 2020-02-13 MED ORDER — ORAL CARE MOUTH RINSE: 15.0000 mL | Freq: Once | OROMUCOSAL | Status: AC

## 2020-02-13 MED ORDER — OXYCODONE HCL 5 MG PO TABS: 5.0000 mg | ORAL_TABLET | Freq: Once | ORAL | Status: DC | PRN

## 2020-02-13 MED ORDER — SUGAMMADEX SODIUM 200 MG/2ML IV SOLN
INTRAVENOUS | Status: DC | PRN
  Administered 2020-02-13: 160 mg via INTRAVENOUS

## 2020-02-13 MED ORDER — 0.9 % SODIUM CHLORIDE (POUR BTL) OPTIME
TOPICAL | Status: DC | PRN
  Administered 2020-02-13 (×2): 1000 mL

## 2020-02-13 MED ORDER — BUPIVACAINE HCL (PF) 0.25 % IJ SOLN
INTRAMUSCULAR | Status: AC
  Filled 2020-02-13: qty 30

## 2020-02-13 MED ORDER — PROPOFOL 10 MG/ML IV BOLUS
INTRAVENOUS | Status: AC
  Filled 2020-02-13: qty 20

## 2020-02-13 MED ORDER — FENTANYL CITRATE (PF) 250 MCG/5ML IJ SOLN
INTRAMUSCULAR | Status: AC
  Filled 2020-02-13: qty 5

## 2020-02-13 MED ORDER — PROCHLORPERAZINE EDISYLATE 10 MG/2ML IJ SOLN: 5.0000 mg | Freq: Four times a day (QID) | INTRAMUSCULAR | Status: DC | PRN

## 2020-02-13 MED ORDER — ROCURONIUM BROMIDE 10 MG/ML (PF) SYRINGE
PREFILLED_SYRINGE | INTRAVENOUS | Status: DC | PRN
  Administered 2020-02-13: 50 mg via INTRAVENOUS
  Administered 2020-02-13: 20 mg via INTRAVENOUS
  Administered 2020-02-13: 30 mg via INTRAVENOUS

## 2020-02-13 MED ORDER — DEXAMETHASONE SODIUM PHOSPHATE 10 MG/ML IJ SOLN
INTRAMUSCULAR | Status: DC | PRN
  Administered 2020-02-13: 4 mg via INTRAVENOUS

## 2020-02-13 MED ORDER — CHLORHEXIDINE GLUCONATE 0.12 % MT SOLN
OROMUCOSAL | Status: AC
  Administered 2020-02-13: 15 mL via OROMUCOSAL
  Filled 2020-02-13: qty 15

## 2020-02-13 MED ORDER — METHOCARBAMOL 500 MG PO TABS
500.0000 mg | ORAL_TABLET | Freq: Four times a day (QID) | ORAL | Status: DC | PRN
  Administered 2020-02-14: 500 mg via ORAL
  Filled 2020-02-13: qty 1

## 2020-02-13 MED ORDER — ONDANSETRON HCL 4 MG/2ML IJ SOLN: 4.0000 mg | Freq: Four times a day (QID) | INTRAMUSCULAR | Status: DC | PRN

## 2020-02-13 MED ORDER — LIDOCAINE 2% (20 MG/ML) 5 ML SYRINGE
INTRAMUSCULAR | Status: DC | PRN
  Administered 2020-02-13: 60 mg via INTRAVENOUS

## 2020-02-13 MED ORDER — MIDAZOLAM HCL 5 MG/5ML IJ SOLN
INTRAMUSCULAR | Status: DC | PRN
  Administered 2020-02-13: 2 mg via INTRAVENOUS

## 2020-02-13 MED ORDER — ENSURE PRE-SURGERY PO LIQD
592.0000 mL | Freq: Once | ORAL | Status: AC
  Administered 2020-02-13: 592 mL via ORAL

## 2020-02-13 MED ORDER — VISTASEAL 10 ML SINGLE DOSE KIT
10.0000 mL | PACK | Freq: Once | CUTANEOUS | Status: DC
  Filled 2020-02-13: qty 10

## 2020-02-13 MED ORDER — SIMETHICONE 80 MG PO CHEW: 40.0000 mg | CHEWABLE_TABLET | Freq: Four times a day (QID) | ORAL | Status: DC | PRN

## 2020-02-13 MED ORDER — ROPIVACAINE HCL 5 MG/ML IJ SOLN
INTRAMUSCULAR | Status: DC | PRN
  Administered 2020-02-13: 20 mL via PERINEURAL

## 2020-02-13 MED ORDER — CEFAZOLIN SODIUM-DEXTROSE 2-4 GM/100ML-% IV SOLN
2.0000 g | INTRAVENOUS | Status: AC
  Administered 2020-02-13: 2 g via INTRAVENOUS

## 2020-02-13 MED ORDER — LIDOCAINE-EPINEPHRINE 1 %-1:100000 IJ SOLN
INTRAMUSCULAR | Status: DC | PRN
  Administered 2020-02-13: 6.5 mL

## 2020-02-13 MED ORDER — ACETAMINOPHEN 500 MG PO TABS: 1000.0000 mg | ORAL_TABLET | ORAL | Status: DC

## 2020-02-13 MED ORDER — ACETAMINOPHEN 500 MG PO TABS: 1000.0000 mg | ORAL_TABLET | Freq: Once | ORAL | Status: AC

## 2020-02-13 MED ORDER — LACTATED RINGERS IV SOLN: INTRAVENOUS | Status: DC

## 2020-02-13 MED ORDER — ENOXAPARIN SODIUM 40 MG/0.4ML ~~LOC~~ SOLN
40.0000 mg | SUBCUTANEOUS | Status: DC
  Administered 2020-02-14 – 2020-02-15 (×2): 40 mg via SUBCUTANEOUS
  Filled 2020-02-13 (×2): qty 0.4

## 2020-02-13 MED ORDER — ACETAMINOPHEN 500 MG PO TABS
ORAL_TABLET | ORAL | Status: AC
  Administered 2020-02-13: 1000 mg via ORAL
  Filled 2020-02-13: qty 2

## 2020-02-13 MED ORDER — PROCHLORPERAZINE MALEATE 10 MG PO TABS
10.0000 mg | ORAL_TABLET | Freq: Four times a day (QID) | ORAL | Status: DC | PRN
  Filled 2020-02-13: qty 1

## 2020-02-13 MED ORDER — VISTASEAL 10 ML SINGLE DOSE KIT
PACK | CUTANEOUS | Status: DC | PRN
  Administered 2020-02-13: 10 mL via TOPICAL

## 2020-02-13 MED ORDER — CHLORHEXIDINE GLUCONATE 0.12 % MT SOLN: 15.0000 mL | Freq: Once | OROMUCOSAL | Status: AC

## 2020-02-13 MED ORDER — FLECAINIDE ACETATE 100 MG PO TABS
100.0000 mg | ORAL_TABLET | Freq: Two times a day (BID) | ORAL | Status: DC
  Administered 2020-02-13 – 2020-02-17 (×8): 100 mg via ORAL
  Filled 2020-02-13 (×9): qty 1

## 2020-02-13 MED ORDER — TRAMADOL HCL 50 MG PO TABS
50.0000 mg | ORAL_TABLET | Freq: Four times a day (QID) | ORAL | Status: DC | PRN
  Administered 2020-02-14 – 2020-02-17 (×5): 50 mg via ORAL
  Filled 2020-02-13 (×5): qty 1

## 2020-02-13 MED ORDER — PANTOPRAZOLE SODIUM 40 MG IV SOLR
40.0000 mg | Freq: Every day | INTRAVENOUS | Status: DC
  Administered 2020-02-13 – 2020-02-16 (×4): 40 mg via INTRAVENOUS
  Filled 2020-02-13 (×4): qty 40

## 2020-02-13 MED ORDER — ACETAMINOPHEN 500 MG PO TABS
1000.0000 mg | ORAL_TABLET | Freq: Four times a day (QID) | ORAL | Status: DC | PRN
  Administered 2020-02-14 – 2020-02-15 (×3): 1000 mg via ORAL
  Filled 2020-02-13 (×4): qty 2

## 2020-02-13 MED ORDER — DIPHENHYDRAMINE HCL 50 MG/ML IJ SOLN: 12.5000 mg | Freq: Four times a day (QID) | INTRAMUSCULAR | Status: DC | PRN

## 2020-02-13 MED ORDER — OXYCODONE HCL 5 MG/5ML PO SOLN: 5.0000 mg | Freq: Once | ORAL | Status: DC | PRN

## 2020-02-13 MED ORDER — LIDOCAINE-EPINEPHRINE 1 %-1:100000 IJ SOLN
INTRAMUSCULAR | Status: AC
  Filled 2020-02-13: qty 1

## 2020-02-13 MED ORDER — CEFAZOLIN SODIUM-DEXTROSE 1-4 GM/50ML-% IV SOLN
INTRAVENOUS | Status: AC
  Filled 2020-02-13: qty 50

## 2020-02-13 MED ORDER — ONDANSETRON 4 MG PO TBDP: 4.0000 mg | ORAL_TABLET | Freq: Four times a day (QID) | ORAL | Status: DC | PRN

## 2020-02-13 SURGICAL SUPPLY — 114 items
APPLICATOR VISTASEAL 35 (MISCELLANEOUS) ×4 IMPLANT
APPLIER CLIP 5 13 M/L LIGAMAX5 (MISCELLANEOUS) ×4
BAG BILE T-TUBES STRL (MISCELLANEOUS) IMPLANT
BLADE CLIPPER SURG (BLADE) IMPLANT
BLADE KNIFE  10 PERSONNA (BLADE) ×4 IMPLANT
CANISTER SUCT 3000ML PPV (MISCELLANEOUS) ×4 IMPLANT
CATH KIT ON Q 5IN DUAL SLV (PAIN MANAGEMENT) IMPLANT
CATH KIT ON-Q SILVERSOAK 5IN (CATHETERS) IMPLANT
CATH KIT ON-Q SILVERSOAK 7.5IN (CATHETERS) IMPLANT
CATH ROBINSON RED A/P 14FR (CATHETERS) IMPLANT
CATH ROBINSON RED A/P 16FR (CATHETERS) IMPLANT
CHLORAPREP W/TINT 26 (MISCELLANEOUS) ×8 IMPLANT
CLIP APPLIE 5 13 M/L LIGAMAX5 (MISCELLANEOUS) ×2 IMPLANT
CLIP VESOCCLUDE LG 6/CT (CLIP) IMPLANT
CLIP VESOCCLUDE MED 24/CT (CLIP) IMPLANT
CLIP VESOCCLUDE SM WIDE 24/CT (CLIP) IMPLANT
CLIP VESOLOCK LG 6/CT PURPLE (CLIP) IMPLANT
CLIP VESOLOCK MED 6/CT (CLIP) IMPLANT
CLIP VESOLOCK MED LG 6/CT (CLIP) IMPLANT
CNTNR URN SCR LID CUP LEK RST (MISCELLANEOUS) ×2 IMPLANT
CONT SPEC 4OZ STRL OR WHT (MISCELLANEOUS) ×2
COVER SURGICAL LIGHT HANDLE (MISCELLANEOUS) ×4 IMPLANT
COVER WAND RF STERILE (DRAPES) IMPLANT
DERMABOND ADVANCED (GAUZE/BANDAGES/DRESSINGS) ×2
DERMABOND ADVANCED .7 DNX12 (GAUZE/BANDAGES/DRESSINGS) ×2 IMPLANT
DRAIN CHANNEL 19F RND (DRAIN) IMPLANT
DRAIN PENROSE 0.5X18 (DRAIN) IMPLANT
DRAPE LAPAROSCOPIC ABDOMINAL (DRAPES) IMPLANT
DRAPE UTILITY XL STRL (DRAPES) ×4 IMPLANT
DRAPE WARM FLUID 44X44 (DRAPES) ×4 IMPLANT
DRSG COVADERM 4X10 (GAUZE/BANDAGES/DRESSINGS) IMPLANT
DRSG COVADERM 4X14 (GAUZE/BANDAGES/DRESSINGS) IMPLANT
DRSG OPSITE POSTOP 4X10 (GAUZE/BANDAGES/DRESSINGS) IMPLANT
ELECT BLADE 6.5 EXT (BLADE) IMPLANT
ELECT CAUTERY BLADE 6.4 (BLADE) ×4 IMPLANT
ELECT REM PT RETURN 9FT ADLT (ELECTROSURGICAL) ×4
ELECTRODE REM PT RTRN 9FT ADLT (ELECTROSURGICAL) ×2 IMPLANT
EVACUATOR SILICONE 100CC (DRAIN) IMPLANT
GAUZE SPONGE 4X4 12PLY STRL (GAUZE/BANDAGES/DRESSINGS) IMPLANT
GEL PDS (MISCELLANEOUS) IMPLANT
GLOVE BIO SURGEON STRL SZ 6 (GLOVE) ×12 IMPLANT
GLOVE BIO SURGEON STRL SZ7 (GLOVE) ×4 IMPLANT
GLOVE INDICATOR 6.5 STRL GRN (GLOVE) ×8 IMPLANT
GOWN STRL REUS W/ TWL LRG LVL3 (GOWN DISPOSABLE) ×4 IMPLANT
GOWN STRL REUS W/TWL 2XL LVL3 (GOWN DISPOSABLE) ×8 IMPLANT
GOWN STRL REUS W/TWL LRG LVL3 (GOWN DISPOSABLE) ×4
GRASPER SUT TROCAR 14GX15 (MISCELLANEOUS) ×4 IMPLANT
HANDLE SUCTION POOLE (INSTRUMENTS) IMPLANT
HEMOSTAT SURGICEL 2X14 (HEMOSTASIS) IMPLANT
KIT BASIN OR (CUSTOM PROCEDURE TRAY) ×4 IMPLANT
KIT TURNOVER KIT B (KITS) ×4 IMPLANT
L-HOOK LAP DISP 36CM (ELECTROSURGICAL)
LHOOK LAP DISP 36CM (ELECTROSURGICAL) IMPLANT
NEEDLE 22X1 1/2 (OR ONLY) (NEEDLE) ×4 IMPLANT
NS IRRIG 1000ML POUR BTL (IV SOLUTION) ×8 IMPLANT
PACK GENERAL/GYN (CUSTOM PROCEDURE TRAY) ×4 IMPLANT
PAD ARMBOARD 7.5X6 YLW CONV (MISCELLANEOUS) ×8 IMPLANT
PENCIL SMOKE EVACUATOR (MISCELLANEOUS) ×4 IMPLANT
POUCH LAPAROSCOPIC INSTRUMENT (MISCELLANEOUS) IMPLANT
POUCH RETRIEVAL ECOSAC 10 (ENDOMECHANICALS) ×2 IMPLANT
POUCH RETRIEVAL ECOSAC 10MM (ENDOMECHANICALS) ×2
RELOAD 45 VASCULAR/THIN (ENDOMECHANICALS) IMPLANT
RELOAD STAPLER BLUE 60MM (STAPLE) ×2 IMPLANT
RELOAD STAPLER WHITE 60MM (STAPLE) IMPLANT
SCISSORS LAP 5X35 DISP (ENDOMECHANICALS) IMPLANT
SET IRRIG TUBING LAPAROSCOPIC (IRRIGATION / IRRIGATOR) ×4 IMPLANT
SET TUBE SMOKE EVAC HIGH FLOW (TUBING) ×4 IMPLANT
SHEARS FOC LG CVD HARMONIC 17C (MISCELLANEOUS) IMPLANT
SHEARS HARMONIC ACE PLUS 36CM (ENDOMECHANICALS) ×4 IMPLANT
SLEEVE ENDOPATH XCEL 5M (ENDOMECHANICALS) ×16 IMPLANT
SLEEVE SURGEON STRL (DRAPES) IMPLANT
SPECIMEN JAR X LARGE (MISCELLANEOUS) ×4 IMPLANT
SPONGE INTESTINAL PEANUT (DISPOSABLE) IMPLANT
SPONGE LAP 18X18 RF (DISPOSABLE) IMPLANT
STAPLE ECHEON FLEX 60 POW ENDO (STAPLE) ×8 IMPLANT
STAPLE LINE REINFORCEMENT LAP (STAPLE) ×4 IMPLANT
STAPLER RELOAD BLUE 60MM (STAPLE) ×4
STAPLER RELOAD WHITE 60MM (STAPLE)
STAPLER VISISTAT 35W (STAPLE) ×4 IMPLANT
STRIP PERI DRY VERITAS 60 (STAPLE) IMPLANT
SUCTION POOLE HANDLE (INSTRUMENTS)
SUT ETHILON 2 0 FS 18 (SUTURE) IMPLANT
SUT MNCRL AB 4-0 PS2 18 (SUTURE) ×8 IMPLANT
SUT NOVA 1 T20/GS 25DT (SUTURE) IMPLANT
SUT PDS AB 1 TP1 96 (SUTURE) IMPLANT
SUT PDS AB 3-0 SH 27 (SUTURE) IMPLANT
SUT PDS II 0 TP-1 LOOPED 60 (SUTURE) ×8 IMPLANT
SUT PROLENE 3 0 SH 48 (SUTURE) ×4 IMPLANT
SUT PROLENE 4 0 RB 1 (SUTURE) ×2
SUT PROLENE 4 0 SH DA (SUTURE) IMPLANT
SUT PROLENE 4-0 RB1 .5 CRCL 36 (SUTURE) ×2 IMPLANT
SUT PROLENE 5 0 C1 (SUTURE) IMPLANT
SUT SILK 2 0 REEL (SUTURE) IMPLANT
SUT SILK 2 0 SH (SUTURE) IMPLANT
SUT SILK 2 0 SH CR/8 (SUTURE) ×4 IMPLANT
SUT SILK 2 0 TIES 10X30 (SUTURE) ×4 IMPLANT
SUT SILK 3 0 SH CR/8 (SUTURE) ×4 IMPLANT
SUT SILK 3 0 TIES 10X30 (SUTURE) ×4 IMPLANT
SUT VICRYL 0 UR6 27IN ABS (SUTURE) ×4 IMPLANT
SYS LAPSCP GELPORT 120MM (MISCELLANEOUS)
SYSTEM LAPSCP GELPORT 120MM (MISCELLANEOUS) IMPLANT
TIP RIGID 35CM EVICEL (HEMOSTASIS) IMPLANT
TOWEL GREEN STERILE (TOWEL DISPOSABLE) ×4 IMPLANT
TOWEL GREEN STERILE FF (TOWEL DISPOSABLE) ×4 IMPLANT
TRAY FOLEY MTR SLVR 14FR STAT (SET/KITS/TRAYS/PACK) ×4 IMPLANT
TRAY LAPAROSCOPIC MC (CUSTOM PROCEDURE TRAY) ×4 IMPLANT
TROCAR XCEL 12X100 BLDLESS (ENDOMECHANICALS) ×8 IMPLANT
TROCAR XCEL BLUNT TIP 100MML (ENDOMECHANICALS) IMPLANT
TROCAR XCEL NON-BLD 5MMX100MML (ENDOMECHANICALS) ×4 IMPLANT
TUBE CONNECTING 12'X1/4 (SUCTIONS) ×2
TUBE CONNECTING 12X1/4 (SUCTIONS) ×6 IMPLANT
TUNNELER SHEATH ON-Q 16GX12 DP (PAIN MANAGEMENT) IMPLANT
WATER STERILE IRR 1000ML POUR (IV SOLUTION) ×4 IMPLANT
YANKAUER SUCT BULB TIP NO VENT (SUCTIONS) ×4 IMPLANT

## 2020-02-13 NOTE — Anesthesia Procedure Notes (Signed)
Procedure Name: Intubation Date/Time: 02/13/2020 12:40 PM Performed by: Amadeo Garnet, CRNA Pre-anesthesia Checklist: Patient identified, Emergency Drugs available, Suction available and Patient being monitored Patient Re-evaluated:Patient Re-evaluated prior to induction Oxygen Delivery Method: Circle system utilized Preoxygenation: Pre-oxygenation with 100% oxygen Induction Type: IV induction Ventilation: Mask ventilation without difficulty Laryngoscope Size: Mac and 3 Grade View: Grade II Tube type: Oral Tube size: 7.0 mm Number of attempts: 1 Airway Equipment and Method: Stylet Placement Confirmation: ETT inserted through vocal cords under direct vision,  positive ETCO2 and breath sounds checked- equal and bilateral Secured at: 22 cm Tube secured with: Tape Dental Injury: Teeth and Oropharynx as per pre-operative assessment

## 2020-02-13 NOTE — Interval H&P Note (Signed)
History and Physical Interval Note:  02/13/2020 12:14 PM  Monica Whitaker  has presented today for surgery, with the diagnosis of cystic pancreatic mass.  The various methods of treatment have been discussed with the patient and family. After consideration of risks, benefits and other options for treatment, the patient has consented to  Procedure(s): LAPAROSCOPIC DISTAL PANCREATECTOMY (N/A) SPLENECTOMY (N/A) as a surgical intervention.  The patient's history has been reviewed, patient examined, no change in status, stable for surgery.  I have reviewed the patient's chart and labs.  Questions were answered to the patient's satisfaction.     Stark Klein

## 2020-02-13 NOTE — Transfer of Care (Signed)
Immediate Anesthesia Transfer of Care Note  Patient: Monica Whitaker  Procedure(s) Performed: LAPAROSCOPIC DISTAL PANCREATECTOMY (N/A Abdomen)  Patient Location: PACU  Anesthesia Type:General  Level of Consciousness: awake, alert  and oriented  Airway & Oxygen Therapy: Patient Spontanous Breathing  Post-op Assessment: Report given to RN, Post -op Vital signs reviewed and stable and Patient moving all extremities  Post vital signs: Reviewed and stable  Last Vitals:  Vitals Value Taken Time  BP 109/62 02/13/20 1519  Temp 36.4 C 02/13/20 1519  Pulse 58 02/13/20 1521  Resp 8 02/13/20 1521  SpO2 93 % 02/13/20 1521  Vitals shown include unvalidated device data.  Last Pain:  Vitals:   02/13/20 1519  TempSrc:   PainSc: 0-No pain         Complications: No complications documented.

## 2020-02-13 NOTE — Op Note (Signed)
PREOPERATIVE DIAGNOSIS:  IPMN pancreas (numerous cystic lesions of the pancreas     POSTOPERATIVE DIAGNOSIS:  Same      PROCEDURE:  Diagnostic laparoscopy, laparoscopic (spleen sparing) distal pancreatectomy     SURGEON:  Stark Klein, MD      ASSISTANT:  Judyann Munson, RNFA     ANESTHESIA:  General and local.      FINDINGS:  Many mucinous lesions throughout pancreas.       SPECIMEN:  Distal pancreas and spleen to Pathology.      ESTIMATED BLOOD LOSS:  75 mL      COMPLICATIONS:  None known.      PROCEDURE:  Patient was identified in the holding area, taken to   operating room, and placed supine on the operating room table.   General anesthesia was induced.  Foley catheter was placed.  Pt was   placed in the leaning spleen position, taking care to ensure appropriate padding.  The abdomen was prepped and   draped in sterile fashion.  Time-out was performed according to surgical   safety check list.  When all was correct, we continued.    The left subcostal region was anesthetized with local.  A 5 mm Optiview trocar was placed under direct visualization.   Pneumoperitoneum was achieved.   Two additional 5 mm ports were placed in the midline and then 1 was placed in the left upper quadrant as well.  The lienocolic ligament was  taken down with the Harmonic.  The lesser sac was opened with the Harmonic and all the adhesions were taken down.  The superior short gastrics were left in place.  The inferior border of the pancreas was identified and the retroperitoneum was opened up.  The pancreas was taken off the retroperitoneum.     The pancreas and the splenic artery and vein were elevated.  The pancreas was carefully dissected away from the splenic artery and vein using the harmonic scalpel and metal clips. Dissection was carried back almost the the portosplenic conflence. The inferior midline port was upsized to a 12 mm port.  The   The pancreas was then divided slowly with the echelon  stapler blue load with the Endopath staple line reinforcement strips.  The specimen was then removed from the abdomen via the 12 mm port in an Ecosac.  The specimen was evaluated on the backtable.      The abdomen was copiously irrigated and there was no sign of any additional bleeding.  Vistaseal was placed over the tail of the pancreas.  The PMI suture passer was used to close the 12 mm port until it was air tight.   The skin of all the incisions was   then closed with 4-0 Monocryl in a subcuticular fashion and then dressed   with soft sterile dressings.  The patient tolerated   the procedure well.  Pt was extubated and taken to the PACU in stable   condition.  Needle, sponge, and instrument counts were correct x2               Stark Klein, MD

## 2020-02-14 ENCOUNTER — Encounter (HOSPITAL_COMMUNITY): Payer: Self-pay | Admitting: General Surgery

## 2020-02-14 LAB — BASIC METABOLIC PANEL
Anion gap: 10 (ref 5–15)
BUN: 12 mg/dL (ref 8–23)
CO2: 22 mmol/L (ref 22–32)
Calcium: 7.9 mg/dL — ABNORMAL LOW (ref 8.9–10.3)
Chloride: 101 mmol/L (ref 98–111)
Creatinine, Ser: 0.57 mg/dL (ref 0.44–1.00)
GFR calc Af Amer: >60 mL/min (ref >60)
GFR calc non Af Amer: >60 mL/min (ref >60)
Glucose, Bld: 208 mg/dL — ABNORMAL HIGH (ref 70–99)
Potassium: 4.6 mmol/L (ref 3.5–5.1)
Sodium: 133 mmol/L — ABNORMAL LOW (ref 135–145)

## 2020-02-14 LAB — CBC
HCT: 39.1 % (ref 36.0–46.0)
Hemoglobin: 13 g/dL (ref 12.0–15.0)
MCH: 30.7 pg (ref 26.0–34.0)
MCHC: 33.2 g/dL (ref 30.0–36.0)
MCV: 92.4 fL (ref 80.0–100.0)
Platelets: 246 10*3/uL (ref 150–400)
RBC: 4.23 MIL/uL (ref 3.87–5.11)
RDW: 14.1 % (ref 11.5–15.5)
WBC: 7.7 10*3/uL (ref 4.0–10.5)
nRBC: 0 % (ref 0.0–0.2)

## 2020-02-14 LAB — GLUCOSE, CAPILLARY
Glucose-Capillary: 163 mg/dL — ABNORMAL HIGH (ref 70–99)
Glucose-Capillary: 191 mg/dL — ABNORMAL HIGH (ref 70–99)

## 2020-02-14 LAB — HEMOGLOBIN A1C
Hgb A1c MFr Bld: 5.6 % (ref 4.8–5.6)
Mean Plasma Glucose: 114.02 mg/dL

## 2020-02-14 MED ORDER — INSULIN ASPART 100 UNIT/ML ~~LOC~~ SOLN
0.0000 [IU] | Freq: Three times a day (TID) | SUBCUTANEOUS | Status: DC
  Administered 2020-02-14: 2 [IU] via SUBCUTANEOUS
  Administered 2020-02-15 – 2020-02-16 (×2): 1 [IU] via SUBCUTANEOUS
  Administered 2020-02-17: 2 [IU] via SUBCUTANEOUS

## 2020-02-14 NOTE — Progress Notes (Signed)
1 Day Post-Op   Subjective/Chief Complaint: No overnight events.  Elevated glucose on AM labs.     Objective: Vital signs in last 24 hours: Temp:  [97.5 F (36.4 C)-98.5 F (36.9 C)] 97.9 F (36.6 C) (07/14 1020) Pulse Rate:  [58-76] 75 (07/14 1020) Resp:  [11-19] 17 (07/14 1020) BP: (97-109)/(50-62) 101/50 (07/14 1020) SpO2:  [93 %-99 %] 99 % (07/14 1020) Last BM Date: 02/12/20  Intake/Output from previous day: 07/13 0701 - 07/14 0700 In: 2671.8 [P.O.:60; I.V.:2411.8; IV Piggyback:200] Out: 1960 [QJFHL:4562; Blood:10] Intake/Output this shift: No intake/output data recorded.  General appearance: alert, cooperative and no distress Resp: breathing comfortably GI: soft, non distended, non tender.  inc c/d/i.  upper midline wound with some bruising.   Extremities: extremities normal, atraumatic, no cyanosis or edema  Lab Results:  Recent Labs    02/13/20 1754 02/14/20 0415  WBC 11.3* 7.7  HGB 12.5 13.0  HCT 38.6 39.1  PLT 263 246   BMET Recent Labs    02/13/20 1754 02/14/20 0415  NA  --  133*  K  --  4.6  CL  --  101  CO2  --  22  GLUCOSE  --  208*  BUN  --  12  CREATININE 0.64 0.57  CALCIUM  --  7.9*   PT/INR No results for input(s): LABPROT, INR in the last 72 hours. ABG No results for input(s): PHART, HCO3 in the last 72 hours.  Invalid input(s): PCO2, PO2  Studies/Results: No results found.  Anti-infectives: Anti-infectives (From admission, onward)   Start     Dose/Rate Route Frequency Ordered Stop   02/13/20 2100  ceFAZolin (ANCEF) IVPB 2g/100 mL premix        2 g 200 mL/hr over 30 Minutes Intravenous Every 8 hours 02/13/20 1653 02/13/20 2138   02/13/20 1001  ceFAZolin (ANCEF) 1-4 GM/50ML-% IVPB       Note to Pharmacy: Gleason, Ginger   : cabinet override      02/13/20 1001 02/13/20 2214   02/13/20 1000  ceFAZolin (ANCEF) IVPB 2g/100 mL premix        2 g 200 mL/hr over 30 Minutes Intravenous On call to O.R. 02/13/20 0956 02/13/20 1313       Assessment/Plan: s/p Procedure(s): LAPAROSCOPIC DISTAL PANCREATECTOMY (N/A) hyperglycemia - add SSI, may transition to DM  Await path - presumed IPMN Ambulate.   LOS: 1 day    Stark Klein 02/14/2020

## 2020-02-14 NOTE — Progress Notes (Signed)
Instructed pt on IS and she did great, 1250 with good technique.

## 2020-02-15 ENCOUNTER — Encounter (HOSPITAL_COMMUNITY): Payer: Self-pay | Admitting: General Surgery

## 2020-02-15 ENCOUNTER — Encounter: Payer: Self-pay | Admitting: Internal Medicine

## 2020-02-15 LAB — CBC
HCT: 33.5 % — ABNORMAL LOW (ref 36.0–46.0)
Hemoglobin: 10.9 g/dL — ABNORMAL LOW (ref 12.0–15.0)
MCH: 30.6 pg (ref 26.0–34.0)
MCHC: 32.5 g/dL (ref 30.0–36.0)
MCV: 94.1 fL (ref 80.0–100.0)
Platelets: 199 10*3/uL (ref 150–400)
RBC: 3.56 MIL/uL — ABNORMAL LOW (ref 3.87–5.11)
RDW: 14.1 % (ref 11.5–15.5)
WBC: 8.9 10*3/uL (ref 4.0–10.5)
nRBC: 0 % (ref 0.0–0.2)

## 2020-02-15 LAB — GLUCOSE, CAPILLARY
Glucose-Capillary: 135 mg/dL — ABNORMAL HIGH (ref 70–99)
Glucose-Capillary: 101 mg/dL — ABNORMAL HIGH (ref 70–99)
Glucose-Capillary: 96 mg/dL (ref 70–99)
Glucose-Capillary: 104 mg/dL — ABNORMAL HIGH (ref 70–99)

## 2020-02-15 LAB — BASIC METABOLIC PANEL WITH GFR
Anion gap: 8 (ref 5–15)
BUN: 12 mg/dL (ref 8–23)
CO2: 20 mmol/L — ABNORMAL LOW (ref 22–32)
Calcium: 7.6 mg/dL — ABNORMAL LOW (ref 8.9–10.3)
Chloride: 104 mmol/L (ref 98–111)
Creatinine, Ser: 0.57 mg/dL (ref 0.44–1.00)
GFR calc Af Amer: >60 mL/min
GFR calc non Af Amer: >60 mL/min
Glucose, Bld: 140 mg/dL — ABNORMAL HIGH (ref 70–99)
Potassium: 5.1 mmol/L (ref 3.5–5.1)
Sodium: 132 mmol/L — ABNORMAL LOW (ref 135–145)

## 2020-02-15 MED ORDER — APIXABAN 5 MG PO TABS
5.0000 mg | ORAL_TABLET | Freq: Two times a day (BID) | ORAL | Status: DC
  Administered 2020-02-15 – 2020-02-17 (×4): 5 mg via ORAL
  Filled 2020-02-15 (×4): qty 1

## 2020-02-15 MED ORDER — GABAPENTIN 100 MG PO CAPS
100.0000 mg | ORAL_CAPSULE | Freq: Every day | ORAL | Status: DC
  Administered 2020-02-16: 100 mg via ORAL
  Filled 2020-02-15: qty 1

## 2020-02-15 NOTE — TOC Initial Note (Signed)
Transition of Care [facility]) - Initial/Assessment Note    Patient Details  Name: Monica Whitaker MRN: [id number] Date of Birth: 1946-04-26  Transition of Care [facility]) CM/SW Contact:    Alexander Mt, LCSW Phone Number: 02/15/2020, 12:33 PM  Clinical Narrative:          3:37pm- Pt seen by therapy, she is interested in the SNF at [facility]. She would like to see if her [employer] coverage would cover a short stay while she recovers. We discussed that we would touch base for a plan B if she is not approved. CSW sent referral and spoke with Seth Bake in admissions at [facility].          12:33pm- CSW spoke with pt at bedside, introduced self, role, reason for visit. Pt from [place]. She is active at home, going to gym and taking yoga. Confirmed home address, PCP, and emergency contact. Pt very independent at baseline, she had a friend loan her a toilet seat cover that raises it up and she states there are many walkers at [facility]. We discussed having therapy come and see her here at hospital to make recmomendations, she is aware and amenable. She is looking forward to getting to try her diet.   Expected Discharge Plan: Home/Self Care Barriers to Discharge: Continued Medical Work up   Patient Goals and CMS Choice Patient states their goals for this hospitalization and ongoing recovery are:: get back to her routine/does yoga and goes to gym regularly  Expected Discharge Plan and Services Expected Discharge Plan: Home/Self Care In-house Referral: Clinical Social Work Discharge Planning Services: CM Consult   Living arrangements for the past 2 months: [place], [place]  Prior Living Arrangements/Services Living arrangements for the past 2 months: [place] Lives with:: Self Patient language and need for interpreter reviewed:: Yes (no needs) Do you feel safe going back to the place where you live?: Yes       Need for Family Participation in Patient Care: Yes (Comment) Care giver support system in place?: Yes (comment)   Criminal Activity/Legal Involvement Pertinent to Current Situation/Hospitalization: No - Comment as needed  Permission Sought/Granted Permission sought to share information with : Facility Sport and exercise psychologist, Family Supports Permission granted to share information with : Yes, Verbal Permission Granted  Share Information with NAME: Althia Forts  Permission granted to share info w AGENCY: [facility] if needed  Permission granted to share info w Relationship: friend  Permission granted to share info w Contact Information: [phone]  Emotional Assessment Appearance:: Appears stated age Attitude/Demeanor/Rapport: Engaged Affect (typically observed): Accepting, Adaptable, Pleasant, Appropriate, Hopeful Orientation: : Oriented to Self, Oriented to Place, Oriented to Situation, Oriented to  Time Alcohol / Substance Use: Not Applicable Psych Involvement: No (comment)  Admission diagnosis:  Pancreatic mass [K86.89] Cystic mass of pancreas [K86.2] Patient Active Problem List   Diagnosis Date Noted  . Pancreatic mass 02/13/2020  . Panic attack 12/15/2019  . Cystic mass of pancreas 12/12/2019  . Essential tremor 09/22/2019  . Hyperlipidemia 09/22/2019  . Chronic female pelvic pain 05/24/2019  . Atrial fibrillation ([place])   . Anxiety   . Tremor 05/10/2019  . RLS (restless legs syndrome) 05/10/2019  . History of stroke 05/10/2019  . Orthostatic hypotension 11/04/2017  . Hypothyroidism 11/04/2017  . OSA on CPAP 06/21/2017  . Persistent atrial fibrillation 06/21/2017  . Cerebrovascular accident (CVA) due to embolism of left middle cerebral artery ([place]) 03/04/2017  .  Osteoporosis 12/26/2015  . ANXIETY DEPRESSION 04/10/2010   PCP:  McLean-Scocuzza, Nino Glow, MD Pharmacy:   [id number] [place], [place] [address] [place]  [address] Phone: [phone] Fax: [phone]   Readmission Risk Interventions Readmission Risk Prevention Plan 02/15/2020  Post Dischage Appt Complete  Medication Screening Complete  Transportation Screening Complete  Some recent data might be hidden

## 2020-02-15 NOTE — Evaluation (Addendum)
Physical Therapy Evaluation Patient Details Name: Monica Whitaker MRN: [id number] DOB: [birth year] Today's Date: 02/15/2020   History of Present Illness  Pt is a 74 y.o. F with significant PMH of CVA, osteoporosis, atrial fibrillation who presents with numerous cystic lesions of the pancreas s/p distal pancreatectomy.   Clinical Impression  Prior to admission, pt resides at [place], is independent and active; enjoys yoga and resistance training. On PT evaluation pt presents with decreased functional mobility secondary to pain (surgical site, L shoulder). Noted left upper trapezius tightness upon palpation; heat applied. Pt ambulating 400 feet without an assistive device at a supervision level. Peak HR 115 bpm. Education provided regarding exercise recommendations, potential activity restrictions, and appropriate body mechanics. Anticipate steady progress.     Follow Up Recommendations Outpatient PT (follow up at [facility])    Equipment Recommendations  None recommended by PT    Recommendations for Other Services       Precautions / Restrictions Precautions Precautions: None Restrictions Weight Bearing Restrictions: No      Mobility  Bed Mobility Overal bed mobility: Modified Independent             General bed mobility comments: Cues for log roll technique, HOB flat  Transfers Overall transfer level: Independent Equipment used: None                Ambulation/Gait Ambulation/Gait assistance: Supervision Gait Distance (Feet): 400 Feet Assistive device: None Gait Pattern/deviations: Step-through pattern;Decreased dorsiflexion - right;Decreased stride length Gait velocity: decreased   General Gait Details: MIld right sided gait deficits from prior stroke including decreased R heel strike and decreased reciprocal arm swing. Improved stability and pace with increased distance. Cues for upward gaze and activity pacing.  Stairs            Wheelchair  Mobility    Modified Rankin (Stroke Patients Only)       Balance Overall balance assessment: Mild deficits observed, not formally tested                                           Pertinent Vitals/Pain Pain Assessment: Faces Faces Pain Scale: Hurts little more Pain Location: surgical site, L shoulder Pain Descriptors / Indicators: Grimacing;Guarding Pain Intervention(s): Limited activity within patient's tolerance;Monitored during session;Heat applied (heat applied to shoulder)    Home Living Family/patient expects to be discharged to:: Private residence Living Arrangements: Alone Available Help at Discharge: Friend(s);Available PRN/intermittently Type of Home: Independent living facility Home Access: Level entry     Home Layout: One level Home Equipment: Other (comment) (toilet riser)      Prior Function Level of Independence: Independent         Comments: Denies falls in past couple of years, does yoga and strength/core training     Hand Dominance        Extremity/Trunk Assessment   Upper Extremity Assessment Upper Extremity Assessment: RUE deficits/detail;LUE deficits/detail RUE Deficits / Details: Strength 5/5 LUE Deficits / Details: Strength 5/5    Lower Extremity Assessment Lower Extremity Assessment: RLE deficits/detail;LLE deficits/detail RLE Deficits / Details: Strength 5/5 LLE Deficits / Details: Strength 5/5       Communication   Communication: No difficulties  Cognition Arousal/Alertness: Awake/alert Behavior During Therapy: WFL for tasks assessed/performed Overall Cognitive Status: Within Functional Limits for tasks assessed  General Comments      Exercises     Assessment/Plan    PT Assessment Patient needs continued PT services  PT Problem List Decreased strength;Decreased activity tolerance;Decreased balance;Decreased mobility;Pain       PT Treatment  Interventions DME instruction;Gait training;Functional mobility training;Therapeutic activities;Therapeutic exercise;Balance training;Patient/family education    PT Goals (Current goals can be found in the Care Plan section)  Acute Rehab PT Goals Patient Stated Goal: increase activity  PT Goal Formulation: With patient Time For Goal Achievement: 02/29/20 Potential to Achieve Goals: Good    Frequency Min 3X/week   Barriers to discharge        Co-evaluation               AM-PAC PT "6 Clicks" Mobility  Outcome Measure Help needed turning from your back to your side while in a flat bed without using bedrails?: None Help needed moving from lying on your back to sitting on the side of a flat bed without using bedrails?: None Help needed moving to and from a bed to a chair (including a wheelchair)?: None Help needed standing up from a chair using your arms (e.g., wheelchair or bedside chair)?: None Help needed to walk in hospital room?: None Help needed climbing 3-5 steps with a railing? : A Little 6 Click Score: 23    End of Session   Activity Tolerance: Patient tolerated treatment well Patient left: in chair;with call bell/phone within reach Nurse Communication: Mobility status PT Visit Diagnosis: Pain;Unsteadiness on feet (R26.81) Pain - Right/Left: Left Pain - part of body: Shoulder (abdomen)    Time: 3005-1102 PT Time Calculation (min) (ACUTE ONLY): 30 min   Charges:   PT Evaluation $PT Eval Moderate Complexity: 1 Mod PT Treatments $Gait Training: 8-22 mins          Wyona Almas, PT, DPT Acute Rehabilitation Services Pager [phone] Office [phone]   Deno Etienne 02/15/2020, 3:20 PM

## 2020-02-15 NOTE — Anesthesia Procedure Notes (Signed)
Anesthesia Regional Block: TAP block   Pre-Anesthetic Checklist: ,, timeout performed, Correct Patient, Correct Site, Correct Laterality, Correct Procedure, Correct Position, site marked, Risks and benefits discussed,  Surgical consent,  Pre-op evaluation,  At surgeon's request and post-op pain management  Laterality: Left  Prep: chloraprep       Needles:  Injection technique: Single-shot     Needle Length: 9cm  Needle Gauge: 22     Additional Needles: Arrow StimuQuik ECHO Echogenic Stimulating PNB Needle  Procedures:,,,, ultrasound used (permanent image in chart),,,,  Narrative:  Start time: 02/13/2020 2:58 PM End time: 02/13/2020 3:02 PM Injection made incrementally with aspirations every 5 mL.  Performed by: Personally  Anesthesiologist: Oleta Mouse, MD

## 2020-02-15 NOTE — Progress Notes (Signed)
2 Days Post-Op   Subjective/Chief Complaint: Had an episode of confusion last night.  No n/v.  + flatus and BM.     Objective: Vital signs in last 24 hours: Temp:  [98 F (36.7 C)-98.8 F (37.1 C)] 98 F (36.7 C) (07/15 0459) Pulse Rate:  [77-85] 81 (07/15 0459) Resp:  [16-18] 18 (07/15 0459) BP: (105-110)/(54-58) 105/58 (07/15 0459) SpO2:  [95 %-96 %] 96 % (07/15 0459) Last BM Date: 02/14/20  Intake/Output from previous day: 07/14 0701 - 07/15 0700 In: 2686.2 [P.O.:500; I.V.:2186.2] Out: 1650 [Urine:1650] Intake/Output this shift: Total I/O In: -  Out: 301 [Urine:300; Stool:1]  General appearance: alert, cooperative and no distress Resp: breathing comfortably GI: soft, non distended, non tender.  inc c/d/i.  upper midline wound with some bruising.   Extremities: extremities normal, atraumatic, no cyanosis or edema  Lab Results:  Recent Labs    02/14/20 0415 02/15/20 0415  WBC 7.7 8.9  HGB 13.0 10.9*  HCT 39.1 33.5*  PLT 246 199   BMET Recent Labs    02/14/20 0415 02/15/20 0415  NA 133* 132*  K 4.6 5.1  CL 101 104  CO2 22 20*  GLUCOSE 208* 140*  BUN 12 12  CREATININE 0.57 0.57  CALCIUM 7.9* 7.6*   PT/INR No results for input(s): LABPROT, INR in the last 72 hours. ABG No results for input(s): PHART, HCO3 in the last 72 hours.  Invalid input(s): PCO2, PO2  Studies/Results: No results found.  Anti-infectives: Anti-infectives (From admission, onward)   Start     Dose/Rate Route Frequency Ordered Stop   02/13/20 2100  ceFAZolin (ANCEF) IVPB 2g/100 mL premix        2 g 200 mL/hr over 30 Minutes Intravenous Every 8 hours 02/13/20 1653 02/13/20 2138   02/13/20 1001  ceFAZolin (ANCEF) 1-4 GM/50ML-% IVPB       Note to Pharmacy: Gleason, Ginger   : cabinet override      02/13/20 1001 02/13/20 2214   02/13/20 1000  ceFAZolin (ANCEF) IVPB 2g/100 mL premix        2 g 200 mL/hr over 30 Minutes Intravenous On call to O.R. 02/13/20 0956 02/13/20 1313       Assessment/Plan: s/p Procedure(s): LAPAROSCOPIC DISTAL PANCREATECTOMY (N/A) hyperglycemia - add SSI, may transition to DM  Await path - presumed IPMN Advance diet. PT consult Decrease neurontin for confusion Decrease IVF. Ambulate.   LOS: 2 days    Monica Whitaker 02/15/2020

## 2020-02-15 NOTE — Anesthesia Postprocedure Evaluation (Signed)
Anesthesia Post Note  Patient: Monica Whitaker  Procedure(s) Performed: LAPAROSCOPIC DISTAL PANCREATECTOMY (N/A Abdomen)     Patient location during evaluation: PACU Anesthesia Type: General and Regional Level of consciousness: awake and alert Pain management: pain level controlled Vital Signs Assessment: post-procedure vital signs reviewed and stable Respiratory status: spontaneous breathing, nonlabored ventilation, respiratory function stable and patient connected to nasal cannula oxygen Cardiovascular status: blood pressure returned to baseline and stable Postop Assessment: no apparent nausea or vomiting Anesthetic complications: no   No complications documented.  Last Vitals:  Vitals:   02/14/20 2123 02/15/20 0459  BP: (!) 110/54 (!) 105/58  Pulse: 77 81  Resp: 16 18  Temp: 36.9 C 36.7 C  SpO2: 95% 96%    Last Pain:  Vitals:   02/15/20 1017  TempSrc:   PainSc: 0-No pain                 Tashea Othman

## 2020-02-16 LAB — BASIC METABOLIC PANEL
Anion gap: 8 (ref 5–15)
BUN: 8 mg/dL (ref 8–23)
CO2: 25 mmol/L (ref 22–32)
Calcium: 8.3 mg/dL — ABNORMAL LOW (ref 8.9–10.3)
Chloride: 102 mmol/L (ref 98–111)
Creatinine, Ser: 0.5 mg/dL (ref 0.44–1.00)
GFR calc Af Amer: >60 mL/min (ref >60)
GFR calc non Af Amer: >60 mL/min (ref >60)
Glucose, Bld: 119 mg/dL — ABNORMAL HIGH (ref 70–99)
Potassium: 4.2 mmol/L (ref 3.5–5.1)
Sodium: 135 mmol/L (ref 135–145)

## 2020-02-16 LAB — GLUCOSE, CAPILLARY
Glucose-Capillary: 119 mg/dL — ABNORMAL HIGH (ref 70–99)
Glucose-Capillary: 108 mg/dL — ABNORMAL HIGH (ref 70–99)
Glucose-Capillary: 132 mg/dL — ABNORMAL HIGH (ref 70–99)
Glucose-Capillary: 111 mg/dL — ABNORMAL HIGH (ref 70–99)

## 2020-02-16 LAB — CBC
HCT: 34.8 % — ABNORMAL LOW (ref 36.0–46.0)
Hemoglobin: 11.3 g/dL — ABNORMAL LOW (ref 12.0–15.0)
MCH: 30 pg (ref 26.0–34.0)
MCHC: 32.5 g/dL (ref 30.0–36.0)
MCV: 92.3 fL (ref 80.0–100.0)
Platelets: 229 10*3/uL (ref 150–400)
RBC: 3.77 MIL/uL — ABNORMAL LOW (ref 3.87–5.11)
RDW: 14.3 % (ref 11.5–15.5)
WBC: 7.4 10*3/uL (ref 4.0–10.5)
nRBC: 0 % (ref 0.0–0.2)

## 2020-02-16 MED ORDER — OXYCODONE HCL 5 MG PO TABS: 5.0000 mg | ORAL_TABLET | ORAL | 0 refills | Status: DC | PRN

## 2020-02-16 MED ORDER — BLOOD GLUCOSE MONITOR KIT: PACK | 0 refills | Status: DC

## 2020-02-16 MED ORDER — ONDANSETRON 4 MG PO TBDP: 4.0000 mg | ORAL_TABLET | Freq: Four times a day (QID) | ORAL | 0 refills | Status: DC | PRN

## 2020-02-16 MED ORDER — TRAMADOL HCL 50 MG PO TABS: 50.0000 mg | ORAL_TABLET | Freq: Four times a day (QID) | ORAL | 0 refills | Status: DC | PRN

## 2020-02-16 MED ORDER — OXYCODONE HCL 5 MG PO TABS
5.0000 mg | ORAL_TABLET | ORAL | Status: DC | PRN
  Administered 2020-02-16: 5 mg via ORAL
  Filled 2020-02-16: qty 1

## 2020-02-16 MED ORDER — DILTIAZEM HCL ER COATED BEADS 240 MG PO CP24
240.0000 mg | ORAL_CAPSULE | Freq: Every day | ORAL | Status: DC
  Administered 2020-02-16 – 2020-02-17 (×2): 240 mg via ORAL
  Filled 2020-02-16 (×2): qty 1

## 2020-02-16 MED ORDER — METHOCARBAMOL 500 MG PO TABS: 500.0000 mg | ORAL_TABLET | Freq: Four times a day (QID) | ORAL | 2 refills | Status: DC | PRN

## 2020-02-16 NOTE — Plan of Care (Signed)

## 2020-02-16 NOTE — TOC Progression Note (Addendum)
Transition of Care [facility]) - Progression Note    Patient Details  Name: Monica Whitaker MRN: [id number] Date of Birth: October 01, 1945  Transition of Care [facility]) CM/SW [place], [place] Phone Number: 02/16/2020, 12:43 PM  Clinical Narrative:    2:57pm- Spoke with pt again via telephone, pt was accepted at [facility] and approved for initial 5 days, ref [id number]. Pt aware that this will be a short stay and is looking forward to getting home when she feels a bit better. RNCM to contact MD, no additional COVID needed. Possible d/c tomorrow or Sunday pending medical stability. Seth Bake in admissions at [facility] aware and able to accept pt over the weekend.   12:43pm- Spoke with pt at bedside. Explained [employer] is reviewing therapy notes and making a determination for coverage at the SNF [facility]) at [facility]. If pt cannot be approved for SNF she is interested in using her three respite days at SNF knowing that they do not include therapy and she may have to pay privately if she stays longer. She is interested in speaking with Seth Bake in admissions at [facility] regarding private paying for SNF if denied. CSW also presented that pt can maybe receive aide services through [facility] provider but that wait list is in place and it may not be right away.   Seth Bake called with this update. TOC team continuing to follow.    Expected Discharge Plan: Home/Self Care Barriers to Discharge: Continued Medical Work up  Expected Discharge Plan and Services Expected Discharge Plan: Home/Self Care In-house Referral: Clinical Social Work Discharge Planning Services: CM Consult Living arrangements for the past 2 months: [place], Single Family Home              Readmission Risk Interventions Readmission Risk Prevention Plan 02/15/2020  Post Dischage Appt Complete  Medication Screening Complete  Transportation Screening Complete  Some recent data might be hidden

## 2020-02-16 NOTE — Progress Notes (Signed)
Physical Therapy Treatment Patient Details Name: Monica Whitaker MRN: [id number] DOB: 05-Oct-1945 Today's Date: 02/16/2020    History of Present Illness Pt is a 74 y.o. F with significant PMH of CVA, osteoporosis, atrial fibrillation who presents with numerous cystic lesions of the pancreas s/p distal pancreatectomy.     PT Comments    Pt OOB standing with RN upon arrival of PT, agreeable to session to discuss d/c plan and work on mobility progression. Despite good performance with PT yesterday, the pt presents today with decreased confidence and stability in standing, constantly reaching for support of single UE when standing and BUE during ambulation. Due to the pt's prior level of independence (living alone with no one to visit/supervise), she is concerned about how she will navigate within the [place] to attend rehab as well as how she will be able to cook meals and safely perform self-care in the home. We trialed a SPC as well as a RW to improve stability with ambulation, and at this time the pt is able to ambulate with minG and RW or minA and SPC. The pt will continue to benefit from skilled PT to further progress functional stability to facilitate return to independent living and prior level of function with reduced risk of falls.     Follow Up Recommendations  SNF;Supervision for mobility/OOB (short term SNF, preferably at [facility])     Equipment Recommendations  Rolling walker with 5" wheels    Recommendations for Other Services       Precautions / Restrictions Precautions Precautions: Fall Restrictions Weight Bearing Restrictions: No    Mobility  Bed Mobility Overal bed mobility: Modified Independent                Transfers Overall transfer level: Modified independent               General transfer comment: Pt up in room upon arrival, multiple transfers through session where pt is immediately reaching for tables or counters to hold when  standing without AD, improved with either HHA or RW  Ambulation/Gait Ambulation/Gait assistance: Supervision;Min assist Gait Distance (Feet): 75 Feet (+ 25 ft) Assistive device: Rolling walker (2 wheeled);1 person hand held assist Gait Pattern/deviations: Step-through pattern;Decreased stride length Gait velocity: 0.27 m/s Gait velocity interpretation: <1.31 ft/sec, indicative of household ambulator General Gait Details: pt ambulating in room without device, reaching for surfaces to hold. Trialed SPC for improved stability but pt still reaching for furniture with other hand. Then trialed RW which pt ambulates well with. Challenged by no AD today, reaching for at least [place] to maintain stability       Balance Overall balance assessment: Needs assistance Sitting-balance support: No upper extremity supported;Feet supported Sitting balance-Leahy Scale: Good     Standing balance support: Bilateral upper extremity supported;During functional activity Standing balance-Leahy Scale: Fair Standing balance comment: pt is able to static stand without AD, but reaches for furniture/support during mobility today, improved lateral movement with BUE support                            Cognition Arousal/Alertness: Awake/alert Behavior During Therapy: WFL for tasks assessed/performed Overall Cognitive Status: Within Functional Limits for tasks assessed                                 General Comments: pt reports feeling more "fuzzy" today, multiple episodes  of lost train of thought which pt is aware of and concerned about. Able to problem solve d/c plan well and answer questions appropriately      Exercises      General Comments General comments (skin integrity, edema, etc.): Discussed home plan/set-up and services of [facility] at length with the patient, she does not have family or friends who could stay with her or assist with food/supervision at home.      Pertinent  Vitals/Pain Pain Assessment: No/denies pain Pain Intervention(s): Monitored during session    Home Living                      Prior Function            PT Goals (current goals can now be found in the care plan section) Acute Rehab PT Goals Patient Stated Goal: increase activity  PT Goal Formulation: With patient Time For Goal Achievement: 02/29/20 Potential to Achieve Goals: Good Progress towards PT goals: Progressing toward goals    Frequency    Min 3X/week      PT Plan Discharge plan needs to be updated       AM-PAC PT "6 Clicks" Mobility   Outcome Measure  Help needed turning from your back to your side while in a flat bed without using bedrails?: None Help needed moving from lying on your back to sitting on the side of a flat bed without using bedrails?: None Help needed moving to and from a bed to a chair (including a wheelchair)?: A Little Help needed standing up from a chair using your arms (e.g., wheelchair or bedside chair)?: A Little Help needed to walk in hospital room?: A Little Help needed climbing 3-5 steps with a railing? : A Little 6 Click Score: 20    End of Session Equipment Utilized During Treatment: Gait belt Activity Tolerance: Patient tolerated treatment well Patient left: in chair;with call bell/phone within reach Nurse Communication: Mobility status PT Visit Diagnosis: Unsteadiness on feet (R26.81)     Time: 0938-1829 PT Time Calculation (min) (ACUTE ONLY): 34 min  Charges:  $Gait Training: 8-22 mins $Self Care/Home Management: 8-22                     Karma Ganja, PT, DPT   Acute Rehabilitation Department Pager #: [phone]   Otho Bellows 02/16/2020, 11:31 AM

## 2020-02-16 NOTE — NC FL2 (Signed)
Comfort LEVEL OF CARE SCREENING TOOL     IDENTIFICATION  Patient Name: Monica Whitaker Birthdate: Nov 19, 1945 Sex: female Admission Date (Current Location): 02/13/2020  [facility] [place] Number:  Engineering geologist and Address:  [facility], [place] [address], [place], [place] [address]      Provider Number: [id number]  Attending Physician Name and Address:  Stark Klein, MD  Relative Name and Phone Number:       Current Level of Care: Hospital Recommended Level of Care: [place] Prior Approval Number:    Date Approved/Denied:   PASRR Number: [id number]  Discharge Plan: SNF    Current Diagnoses: Patient Active Problem List   Diagnosis Date Noted  . Pancreatic mass 02/13/2020  . Panic attack 12/15/2019  . Cystic mass of pancreas 12/12/2019  . Essential tremor 09/22/2019  . Hyperlipidemia 09/22/2019  . Chronic female pelvic pain 05/24/2019  . Atrial fibrillation ([place])   . Anxiety   . Tremor 05/10/2019  . RLS (restless legs syndrome) 05/10/2019  . History of stroke 05/10/2019  . Orthostatic hypotension 11/04/2017  . Hypothyroidism 11/04/2017  . OSA on CPAP 06/21/2017  . Persistent atrial fibrillation 06/21/2017  . Cerebrovascular accident (CVA) due to embolism of left middle cerebral artery ([place]) 03/04/2017  . Osteoporosis 12/26/2015  . ANXIETY DEPRESSION 04/10/2010    Orientation RESPIRATION BLADDER Height & Weight     Self, Time, Situation, Place  Normal Continent Weight: 170 lb (77.1 kg) Height:  5\' 9"  (175.3 cm)  BEHAVIORAL SYMPTOMS/MOOD NEUROLOGICAL BOWEL NUTRITION STATUS      Continent Diet (see discharge summary)  AMBULATORY STATUS COMMUNICATION OF NEEDS Skin   Limited Assist Verbally Surgical wounds (closed incision/lap sites on abdomen)                       Personal Care Assistance Level of Assistance  Feeding, Bathing, Dressing Bathing Assistance: Limited assistance Feeding  assistance: Independent Dressing Assistance: Limited assistance     Functional Limitations Info  Sight, Hearing, Speech Sight Info: Adequate Hearing Info: Adequate Speech Info: Adequate    SPECIAL CARE FACTORS FREQUENCY  PT (By licensed PT), OT (By licensed OT)     PT Frequency: 5x week OT Frequency: 5x week            Contractures Contractures Info: Not present    Additional Factors Info  Code Status, Allergies, Insulin Sliding Scale Code Status Info: Full Code Allergies Info: Darvon, Propoxyphene, Propoxyphene N-acetaminophen, Levofloxacin   Insulin Sliding Scale Info: insulin aspart (novoLOG) injection 0-9 Units 3x daily with meals       Current Medications (02/16/2020):  This is the current hospital active medication list Current Facility-Administered Medications  Medication Dose Route Frequency Provider Last Rate Last Admin  . acetaminophen (TYLENOL) tablet 1,000 mg  1,000 mg Oral Q6H PRN Stark Klein, MD   1,000 mg at 02/15/20 1439  . apixaban (ELIQUIS) tablet 5 mg  5 mg Oral BID Stark Klein, MD   5 mg at 02/16/20 1011  . diltiazem (CARDIZEM CD) 24 hr capsule 240 mg  240 mg Oral Daily Stark Klein, MD   240 mg at 02/16/20 1011  . diphenhydrAMINE (BENADRYL) 12.5 MG/5ML elixir 12.5 mg  12.5 mg Oral Q6H PRN Stark Klein, MD       Or  . diphenhydrAMINE (BENADRYL) injection 12.5 mg  12.5 mg Intravenous Q6H PRN Stark Klein, MD      . flecainide [facility]) tablet  100 mg  100 mg Oral BID Stark Klein, MD   100 mg at 02/16/20 1011  . gabapentin (NEURONTIN) capsule 100 mg  100 mg Oral QHS Stark Klein, MD      . insulin aspart (novoLOG) injection 0-9 Units  0-9 Units Subcutaneous TID WC Stark Klein, MD   1 Units at 02/15/20 0925  . levothyroxine (SYNTHROID) tablet 25 mcg  25 mcg Oral Q0600 Stark Klein, MD   25 mcg at 02/16/20 0517  . melatonin tablet 5 mg  5 mg Oral QHS PRN Stark Klein, MD      . methocarbamol (ROBAXIN) tablet 500 mg  500 mg Oral Q6H PRN  Stark Klein, MD   500 mg at 02/14/20 0254  . morphine 2 MG/ML injection 1-2 mg  1-2 mg Intravenous Q4H PRN Stark Klein, MD   2 mg at 02/16/20 6203  . ondansetron (ZOFRAN-ODT) disintegrating tablet 4 mg  4 mg Oral Q6H PRN Stark Klein, MD       Or  . ondansetron (ZOFRAN) injection 4 mg  4 mg Intravenous Q6H PRN Stark Klein, MD      . oxyCODONE (Oxy IR/ROXICODONE) immediate release tablet 5-10 mg  5-10 mg Oral Q4H PRN Stark Klein, MD      . pantoprazole (PROTONIX) injection 40 mg  40 mg Intravenous QHS Stark Klein, MD   40 mg at 02/15/20 2149  . pravastatin (PRAVACHOL) tablet 10 mg  10 mg Oral QHS Stark Klein, MD   10 mg at 02/15/20 2145  . prochlorperazine (COMPAZINE) tablet 10 mg  10 mg Oral Q6H PRN Stark Klein, MD       Or  . prochlorperazine (COMPAZINE) injection 5-10 mg  5-10 mg Intravenous Q6H PRN Stark Klein, MD      . senna (SENOKOT) tablet 8.6 mg  1 tablet Oral BID Stark Klein, MD   8.6 mg at 02/16/20 1011  . simethicone (MYLICON) chewable tablet 40 mg  40 mg Oral Q6H PRN Stark Klein, MD      . timolol (TIMOPTIC) 0.5 % ophthalmic solution 1 drop  1 drop Left Eye Red Christians, MD   1 drop at 02/15/20 2149  . traMADol (ULTRAM) tablet 50 mg  50 mg Oral Q6H PRN Stark Klein, MD   50 mg at 02/16/20 5597     Discharge Medications: Please see discharge summary for a list of discharge medications.  Relevant Imaging Results:  Relevant Lab Results:   Additional Information [id number] [place], [place]

## 2020-02-16 NOTE — Progress Notes (Signed)
3 Days Post-Op   Subjective/Chief Complaint: Tolerated full liquids yesterday and soft diet this AM.  No n/v.  Still having flatus and PM.  Complained of more pain yesterday and last night.     Objective: Vital signs in last 24 hours: Temp:  [98.4 F (36.9 C)-99.4 F (37.4 C)] 98.4 F (36.9 C) (07/16 0520) Pulse Rate:  [80-82] 82 (07/16 0520) Resp:  [16-17] 16 (07/16 0520) BP: (121-126)/(57-64) 126/57 (07/16 0520) SpO2:  [96 %-97 %] 97 % (07/16 0520) Last BM Date: 02/14/20  Intake/Output from previous day: 07/15 0701 - 07/16 0700 In: 803.7 [P.O.:180; I.V.:623.7] Out: 701 [Urine:700; Stool:1] Intake/Output this shift: No intake/output data recorded.  General appearance: alert, cooperative and no distress Resp: breathing comfortably GI: soft, non distended, non tender.  inc c/d/i.  upper midline wound with some bruising, but this is fading.   Extremities: extremities normal, atraumatic, no cyanosis or edema  Lab Results:  Recent Labs    02/15/20 0415 02/16/20 0652  WBC 8.9 7.4  HGB 10.9* 11.3*  HCT 33.5* 34.8*  PLT 199 229   BMET Recent Labs    02/15/20 0415 02/16/20 0652  NA 132* 135  K 5.1 4.2  CL 104 102  CO2 20* 25  GLUCOSE 140* 119*  BUN 12 8  CREATININE 0.57 0.50  CALCIUM 7.6* 8.3*   PT/INR No results for input(s): LABPROT, INR in the last 72 hours. ABG No results for input(s): PHART, HCO3 in the last 72 hours.  Invalid input(s): PCO2, PO2  Studies/Results: No results found.  Anti-infectives: Anti-infectives (From admission, onward)   Start     Dose/Rate Route Frequency Ordered Stop   02/13/20 2100  ceFAZolin (ANCEF) IVPB 2g/100 mL premix        2 g 200 mL/hr over 30 Minutes Intravenous Every 8 hours 02/13/20 1653 02/13/20 2138   02/13/20 1001  ceFAZolin (ANCEF) 1-4 GM/50ML-% IVPB       Note to Pharmacy: Gleason, Ginger   : cabinet override      02/13/20 1001 02/13/20 2214   02/13/20 1000  ceFAZolin (ANCEF) IVPB 2g/100 mL premix        2  g 200 mL/hr over 30 Minutes Intravenous On call to O.R. 02/13/20 0956 02/13/20 1313      Assessment/Plan: s/p Procedure(s): LAPAROSCOPIC DISTAL PANCREATECTOMY (N/A)  Hyperglycemia - using very little insulin.  Advise getting blood glucose monitoring kit and discussing with PCP in around 2-4 weeks.   Await path - presumed IPMN  diet as tolerated PT consult- outpatient PT recommended at [facility].    Saline lock IVF Ambulate. Possible d/c tomorrow if patient is doing well and labs are OK. However she lives alone at [facility] and doesn't qualify for the SNF/rehab portion.   May need another day prior to d/c for better movement and less pain.  Still required IV pain meds.     LOS: 3 days    Stark Klein 02/16/2020

## 2020-02-16 NOTE — Discharge Instructions (Addendum)
[facility], [place] [phone]  ABDOMINAL SURGERY: POST OP INSTRUCTIONS  Always review your discharge instruction sheet given to you by the facility where your surgery was performed.  IF YOU HAVE DISABILITY OR FAMILY LEAVE FORMS, YOU MUST BRING THEM TO THE OFFICE FOR PROCESSING.  PLEASE DO NOT GIVE THEM TO YOUR DOCTOR.  1. A prescription for pain medication may be given to you upon discharge.  Take your pain medication as prescribed, if needed.  If narcotic pain medicine is not needed, then you may take acetaminophen (Tylenol) or ibuprofen (Advil) as needed. 2. Take your usually prescribed medications unless otherwise directed. 3. If you need a refill on your pain medication, please contact your pharmacy. They will contact our office to request authorization.  Prescriptions will not be filled after 5pm or on week-ends. 4. You should follow a light diet the first few days after arrival home, such as soup and crackers, pudding, etc.unless your doctor has advised otherwise. A high-fiber, low fat diet can be resumed as tolerated.   Be sure to include lots of fluids daily. Most patients will experience some swelling and bruising on the chest and neck area.  Ice packs will help.  Swelling and bruising can take several days to resolve 5. Most patients will experience some swelling and bruising in the area of the incision. Ice pack will help. Swelling and bruising can take several days to resolve..  6. It is common to experience some constipation if taking pain medication after surgery.  Increasing fluid intake and taking a stool softener will usually help or prevent this problem from occurring.  A mild laxative (Milk of Magnesia or Miralax) should be taken according to package directions if there are no bowel movements after 48 hours. 7.  You may find that a light gauze bandage over your incision may keep your staples from being rubbed or pulled. You may shower, but no baths or swimming  pools for 2 weeks.   8. ACTIVITIES:  You may resume regular (light) daily activities beginning the next day--such as daily self-care, walking, climbing stairs--gradually increasing activities as tolerated.  You may have sexual intercourse when it is comfortable.  Refrain from any heavy lifting or straining until approved by your doctor. a. You may drive when you no longer are taking prescription pain medication, you can comfortably wear a seatbelt, and you can safely maneuver your car and apply brakes b. Return to Work: __________to be determined._________________________ 9. You should see your doctor in the office for a follow-up appointment approximately two weeks after your surgery.  Make sure that you call for this appointment within a day or two after you arrive home to insure a convenient appointment time. OTHER INSTRUCTIONS:  _____________________________________________________________ _____________________________________________________________  WHEN TO CALL YOUR DOCTOR: 1. Fever over 101.0 2. Inability to urinate 3. Nausea and/or vomiting 4. Extreme swelling or bruising 5. Continued bleeding from incision. 6. Increased pain, redness, or drainage from the incision. 7. Difficulty swallowing or breathing 8. Muscle cramping or spasms. 9. Numbness or tingling in hands or feet or around lips.  The clinic staff is available to answer your questions during regular business hours.  Please don't hesitate to call and ask to speak to one of the nurses if you have concerns.  For further questions, please visit [email or web address]   Blood Glucose Monitoring, Adult Monitoring your blood sugar (glucose) is an important part of managing your diabetes (diabetes mellitus). Blood glucose monitoring involves checking your  blood glucose as often as directed and keeping a record (log) of your results over time. Checking your blood glucose regularly and keeping a blood glucose log  can:  Help you and your health care provider adjust your diabetes management plan as needed, including your medicines or insulin.  Help you understand how food, exercise, illnesses, and medicines affect your blood glucose.  Let you know what your blood glucose is at any time. You can quickly find out if you have low blood glucose (hypoglycemia) or high blood glucose (hyperglycemia). Your health care provider will set individualized treatment goals for you. Your goals will be based on your age, other medical conditions you have, and how you respond to diabetes treatment. Generally, the goal of treatment is to maintain the following blood glucose levels:  Before meals (preprandial): 80-130 mg/dL (4.4-7.2 mmol/L).  After meals (postprandial): below 180 mg/dL (10 mmol/L).  A1c level: less than 7%. Supplies needed:  Blood glucose meter.  Test strips for your meter. Each meter has its own strips. You must use the strips that came with your meter.  A needle to prick your finger (lancet). Do not use a lancet more than one time.  A device that holds the lancet (lancing device).  A journal or log book to write down your results. How to check your blood glucose  1. Wash your hands with soap and water. 2. Prick the side of your finger (not the tip) with the lancet. Use a different finger each time. 3. Gently rub the finger until a small drop of blood appears. 4. Follow instructions that come with your meter for inserting the test strip, applying blood to the strip, and using your blood glucose meter. 5. Write down your result and any notes. Some meters allow you to use areas of your body other than your finger (alternative sites) to test your blood. The most common alternative sites are:  Forearm.  Thigh.  Palm of the hand. If you think you may have hypoglycemia, or if you have a history of not knowing when your blood glucose is getting low (hypoglycemia unawareness), do not use  alternative sites. Use your finger instead. Alternative sites may not be as accurate as the fingers, because blood flow is slower in these areas. This means that the result you get may be delayed, and it may be different from the result that you would get from your finger. Follow these instructions at home: Blood glucose log   Every time you check your blood glucose, write down your result. Also write down any notes about things that may be affecting your blood glucose, such as your diet and exercise for the day. This information can help you and your health care provider: ? Look for patterns in your blood glucose over time. ? Adjust your diabetes management plan as needed.  Check if your meter allows you to download your records to a computer. Most glucose meters store a record of glucose readings in the meter. If you have type 1 diabetes:  Check your blood glucose 2 or more times a day.  Also check your blood glucose: ? Before every insulin injection. ? Before and after exercise. ? Before meals. ? 2 hours after a meal. ? Occasionally between 2:00 a.m. and 3:00 a.m., as directed. ? Before potentially dangerous tasks, like driving or using heavy machinery. ? At bedtime.  You may need to check your blood glucose more often, up to 6-10 times a day, if you: ? Use  an insulin pump. ? Need multiple daily injections (MDI). ? Have diabetes that is not well-controlled. ? Are ill. ? Have a history of severe hypoglycemia. ? Have hypoglycemia unawareness. If you have type 2 diabetes:  If you take insulin or other diabetes medicines, check your blood glucose 2 or more times a day.  If you are on intensive insulin therapy, check your blood glucose 4 or more times a day. Occasionally, you may also need to check between 2:00 a.m. and 3:00 a.m., as directed.  Also check your blood glucose: ? Before and after exercise. ? Before potentially dangerous tasks, like driving or using heavy  machinery.  You may need to check your blood glucose more often if: ? Your medicine is being adjusted. ? Your diabetes is not well-controlled. ? You are ill. General tips  Always keep your supplies with you.  If you have questions or need help, all blood glucose meters have a 24-hour "hotline" phone number that you can call. You may also contact your health care provider.  After you use a few boxes of test strips, adjust (calibrate) your blood glucose meter by following instructions that came with your meter. Contact a health care provider if:  Your blood glucose is at or above 240 mg/dL (13.3 mmol/L) for 2 days in a row.  You have been sick or have had a fever for 2 days or longer, and you are not getting better.  You have any of the following problems for more than 6 hours: ? You cannot eat or drink. ? You have nausea or vomiting. ? You have diarrhea. Get help right away if:  Your blood glucose is lower than 54 mg/dL (3 mmol/L).  You become confused or you have trouble thinking clearly.  You have difficulty breathing.  You have moderate or large ketone levels in your urine. Summary  Monitoring your blood sugar (glucose) is an important part of managing your diabetes (diabetes mellitus).  Blood glucose monitoring involves checking your blood glucose as often as directed and keeping a record (log) of your results over time.  Your health care provider will set individualized treatment goals for you. Your goals will be based on your age, other medical conditions you have, and how you respond to diabetes treatment.  Every time you check your blood glucose, write down your result. Also write down any notes about things that may be affecting your blood glucose, such as your diet and exercise for the day. This information is not intended to replace advice given to you by your health care provider. Make sure you discuss any questions you have with your health care  provider. Document Revised: 05/13/2018 Document Reviewed: 12/30/2015 Elsevier Patient Education  2020 [employer].  =======================================================================================================  Information on my medicine - ELIQUIS (apixaban)  Why was Eliquis prescribed for you? Eliquis was prescribed for you to reduce the risk of a blood clot forming that can cause a stroke if you have a medical condition called atrial fibrillation (a type of irregular heartbeat).  What do You need to know about Eliquis ? Take your Eliquis TWICE DAILY - one tablet in the morning and one tablet in the evening with or without food. If you have difficulty swallowing the tablet whole please discuss with your pharmacist how to take the medication safely.  Take Eliquis exactly as prescribed by your doctor and DO NOT stop taking Eliquis without talking to the doctor who prescribed the medication.  Stopping may increase your risk of developing  a stroke.  Refill your prescription before you run out.  After discharge, you should have regular check-up appointments with your healthcare provider that is prescribing your Eliquis.  In the future your dose may need to be changed if your kidney function or weight changes by a significant amount or as you get older.  What do you do if you miss a dose? If you miss a dose, take it as soon as you remember on the same day and resume taking twice daily.  Do not take more than one dose of ELIQUIS at the same time to make up a missed dose.  Important Safety Information A possible side effect of Eliquis is bleeding. You should call your healthcare provider right away if you experience any of the following: ? Bleeding from an injury or your nose that does not stop. ? Unusual colored urine (red or dark brown) or unusual colored stools (red or black). ? Unusual bruising for unknown reasons. ? A serious fall or if you hit your head (even if there is  no bleeding).  Some medicines may interact with Eliquis and might increase your risk of bleeding or clotting while on Eliquis. To help avoid this, consult your healthcare provider or pharmacist prior to using any new prescription or non-prescription medications, including herbals, vitamins, non-steroidal anti-inflammatory drugs (NSAIDs) and supplements.  This website has more information on Eliquis (apixaban): http://www.eliquis.com/eliquis/home

## 2020-02-17 DIAGNOSIS — I4891 Unspecified atrial fibrillation: Secondary | ICD-10-CM | POA: Diagnosis not present

## 2020-02-17 DIAGNOSIS — R2689 Other abnormalities of gait and mobility: Secondary | ICD-10-CM | POA: Diagnosis not present

## 2020-02-17 DIAGNOSIS — F419 Anxiety disorder, unspecified: Secondary | ICD-10-CM | POA: Diagnosis not present

## 2020-02-17 DIAGNOSIS — R52 Pain, unspecified: Secondary | ICD-10-CM | POA: Diagnosis not present

## 2020-02-17 DIAGNOSIS — Z483 Aftercare following surgery for neoplasm: Secondary | ICD-10-CM | POA: Diagnosis not present

## 2020-02-17 DIAGNOSIS — E785 Hyperlipidemia, unspecified: Secondary | ICD-10-CM | POA: Diagnosis not present

## 2020-02-17 DIAGNOSIS — G8929 Other chronic pain: Secondary | ICD-10-CM | POA: Diagnosis not present

## 2020-02-17 DIAGNOSIS — G4733 Obstructive sleep apnea (adult) (pediatric): Secondary | ICD-10-CM | POA: Diagnosis not present

## 2020-02-17 DIAGNOSIS — Z90410 Acquired total absence of pancreas: Secondary | ICD-10-CM | POA: Diagnosis not present

## 2020-02-17 DIAGNOSIS — R2681 Unsteadiness on feet: Secondary | ICD-10-CM | POA: Diagnosis not present

## 2020-02-17 DIAGNOSIS — R102 Pelvic and perineal pain: Secondary | ICD-10-CM | POA: Diagnosis not present

## 2020-02-17 DIAGNOSIS — F329 Major depressive disorder, single episode, unspecified: Secondary | ICD-10-CM | POA: Diagnosis not present

## 2020-02-17 DIAGNOSIS — Z8673 Personal history of transient ischemic attack (TIA), and cerebral infarction without residual deficits: Secondary | ICD-10-CM | POA: Diagnosis not present

## 2020-02-17 DIAGNOSIS — C259 Malignant neoplasm of pancreas, unspecified: Secondary | ICD-10-CM | POA: Diagnosis not present

## 2020-02-17 DIAGNOSIS — M6281 Muscle weakness (generalized): Secondary | ICD-10-CM | POA: Diagnosis not present

## 2020-02-17 DIAGNOSIS — D378 Neoplasm of uncertain behavior of other specified digestive organs: Secondary | ICD-10-CM | POA: Diagnosis not present

## 2020-02-17 DIAGNOSIS — E039 Hypothyroidism, unspecified: Secondary | ICD-10-CM | POA: Diagnosis not present

## 2020-02-17 DIAGNOSIS — G250 Essential tremor: Secondary | ICD-10-CM | POA: Diagnosis not present

## 2020-02-17 LAB — GLUCOSE, CAPILLARY
Glucose-Capillary: 98 mg/dL (ref 70–99)
Glucose-Capillary: 151 mg/dL — ABNORMAL HIGH (ref 70–99)

## 2020-02-17 LAB — BASIC METABOLIC PANEL
Anion gap: 10 (ref 5–15)
BUN: 10 mg/dL (ref 8–23)
CO2: 26 mmol/L (ref 22–32)
Calcium: 8.3 mg/dL — ABNORMAL LOW (ref 8.9–10.3)
Chloride: 101 mmol/L (ref 98–111)
Creatinine, Ser: 0.61 mg/dL (ref 0.44–1.00)
GFR calc Af Amer: >60 mL/min (ref >60)
GFR calc non Af Amer: >60 mL/min (ref >60)
Glucose, Bld: 104 mg/dL — ABNORMAL HIGH (ref 70–99)
Potassium: 4.1 mmol/L (ref 3.5–5.1)
Sodium: 137 mmol/L (ref 135–145)

## 2020-02-17 LAB — CBC
HCT: 38.6 % (ref 36.0–46.0)
Hemoglobin: 12.9 g/dL (ref 12.0–15.0)
MCH: 31.2 pg (ref 26.0–34.0)
MCHC: 33.4 g/dL (ref 30.0–36.0)
MCV: 93.5 fL (ref 80.0–100.0)
Platelets: 233 10*3/uL (ref 150–400)
RBC: 4.13 MIL/uL (ref 3.87–5.11)
RDW: 14.3 % (ref 11.5–15.5)
WBC: 6.7 10*3/uL (ref 4.0–10.5)
nRBC: 0 % (ref 0.0–0.2)

## 2020-02-17 NOTE — Plan of Care (Signed)

## 2020-02-17 NOTE — TOC Transition Note (Signed)
Transition of Care [facility]) - CM/SW Discharge Note   Patient Details  Name: Monica Whitaker MRN: [id number] Date of Birth: [birth year]  Transition of Care [facility]) CM/SW Contact:  Gabrielle Dare Phone Number: 02/17/2020, 11:16 AM   Clinical Narrative:    Patient will Discharge To: [facility]  Anticipated DC Date:02/17/20 Family Notified: yes, Althia Forts (Friend) [phone] Transport By: Ernst Spell   Per MD patient ready for DC to [facility]. RN, patient, patient's family, and facility notified of DC. Assessment, Fl2/Pasrr, and Discharge Summary sent to facility. RN given number for report [phone], Room # 207). DC packet on chart.    CSW signing off.  Reed Breech [facility] [phone]     Final next level of care: Other (comment) ([place]) Barriers to Discharge: No Barriers Identified   Patient Goals and CMS Choice Patient states their goals for this hospitalization and ongoing recovery are:: get back to her routine/does yoga and goes to gym regularly      Discharge Placement              Patient chooses bed at: [facility] Patient to be transferred to facility by: Car Name of family member notified: Althia Forts (friend) Patient and family notified of of transfer: 02/17/20  Discharge Plan and Services In-house Referral: Clinical Social Work Discharge Planning Services: CM Consult                                 Social Determinants of Health (SDOH) Interventions     Readmission Risk Interventions Readmission Risk Prevention Plan 02/15/2020  Post Dischage Appt Complete  Medication Screening Complete  Transportation Screening Complete  Some recent data might be hidden

## 2020-02-17 NOTE — Discharge Summary (Signed)
Physician Discharge Summary  Patient ID: Monica Whitaker MRN: [id number] DOB/AGE: [birth year] 73 y.o.  Admit date: 02/13/2020 Discharge date: 02/17/2020  Admission Diagnoses: Cystic pancreatic mass Atrial fibrillation Essential tremor  Discharge Diagnoses:  Active Problems:   Cystic mass of pancreas   Pancreatic mass and same as above.  Discharged Condition: stable  Consults: none  Significant Diagnostic Studies: labs: HCT prior to d/c.    Treatments: surgery: see above  Discharge Exam: Blood pressure (!) 103/58, pulse 78, temperature 98.3 F (36.8 C), temperature source Oral, resp. rate 18, height 5' 9"  (1.753 m), weight 77.1 kg, SpO2 93 %. General appearance: alert and cooperative Resp: breathing comfortably Cardio: regular rate and rhythm GI: soft, non tender, non distended.  Extremities: extremities normal, atraumatic, no cyanosis or edema  Disposition: Back to [facility]   Allergies as of 02/17/2020      Reactions   Darvon Other (See Comments)   Severe panic attacks   Propoxyphene Other (See Comments)   GI Upset Severe panic attacks GI Upset   Propoxyphene N-acetaminophen Other (See Comments)   Severe panic attacks   Levofloxacin Anxiety, Other (See Comments)   Causes panic attacks.      Medication List    TAKE these medications   acetaminophen 500 MG tablet Commonly known as: TYLENOL Take 1,000 mg by mouth every 6 (six) hours as needed (pain).   amphetamine-dextroamphetamine 10 MG 24 hr capsule Commonly known as: ADDERALL XR Take 1 capsule (10 mg total) by mouth daily.   B-complex with vitamin C tablet Take 1 tablet by mouth daily.   blood glucose meter kit and supplies Kit Dispense based on patient and insurance preference. Use up to four times daily as directed. (FOR ICD-9 250.00, 250.01).   CALCIUM 600+D3 PO Take 2 tablets by mouth daily.   CRANBERRY EXTRACT PO Take 2 tablets by mouth every evening.   Dextran  Powd by Does not apply route.   diltiazem 240 MG 24 hr capsule Commonly known as: CARDIZEM CD TAKE 1 CAPSULE (240 MG TOTAL) BY MOUTH DAILY. MAY TAKE EXTRA CAPSULE DAILY AS NEEDED FOR AFIB   DULoxetine 60 MG capsule Commonly known as: CYMBALTA TAKE 1 CAPSULE (60 MG TOTAL) BY MOUTH 2 (TWO) TIMES DAILY.   Eliquis 5 MG Tabs tablet Generic drug: apixaban TAKE 1 TABLET BY MOUTH TWICE A DAY What changed:   how much to take  when to take this   estradiol 0.1 MG/GM vaginal cream Commonly known as: ESTRACE VAGINAL Apply 0.16m (pea-sized amount)  just inside the vaginal introitus with a finger-tip on Monday, Wednesday and Friday nights.   flecainide 100 MG tablet Commonly known as: TAMBOCOR TAKE 1 TABLET BY MOUTH TWICE A DAY What changed: when to take this   gabapentin 100 MG capsule Commonly known as: NEURONTIN Take 2 capsules (200 mg total) by mouth 2 (two) times daily.   levothyroxine 25 MCG tablet Commonly known as: SYNTHROID Take 1 tablet (25 mcg total) by mouth daily. 30 minutes before food What changed: when to take this   LORazepam 0.5 MG tablet Commonly known as: ATIVAN Take 1 tablet (0.5 mg total) by mouth every 8 (eight) hours.   Melatonin 10 MG Tabs Take 2.5-5 mg by mouth at bedtime as needed (sleep.).   methocarbamol 500 MG tablet Commonly known as: ROBAXIN Take 1 tablet (500 mg total) by mouth every 6 (six) hours as needed for muscle spasms.   multivitamin with minerals Tabs tablet Take 1 tablet  by mouth daily. One-A-Day   ondansetron 4 MG disintegrating tablet Commonly known as: ZOFRAN-ODT Take 1 tablet (4 mg total) by mouth every 6 (six) hours as needed for nausea.   oxyCODONE 5 MG immediate release tablet Commonly known as: Oxy IR/ROXICODONE Take 1-2 tablets (5-10 mg total) by mouth every 4 (four) hours as needed for moderate pain, severe pain or breakthrough pain.   pravastatin 20 MG tablet Commonly known as: PRAVACHOL Take 1 tablet (20 mg total)  by mouth at bedtime. What changed: how much to take   Prolia 60 MG/ML Soln injection Generic drug: denosumab Inject 60 mg into the skin every 6 (six) months.   SALT SUBSTITUTES PO Take 1 tablet by mouth daily as needed (hypotension (prevention of syncope episode)).   Timoptic 0.5 % ophthalmic solution Generic drug: timolol Place 1 drop into the left eye at bedtime.   traMADol 50 MG tablet Commonly known as: ULTRAM Take 1 tablet (50 mg total) by mouth every 6 (six) hours as needed (mild pain).   vitamin C 500 MG tablet Commonly known as: ASCORBIC ACID Take 1,000 mg by mouth daily.   Vitamin D3 50 MCG (2000 UT) Tabs Take 4,000 Units by mouth daily.   vitamin E 180 MG (400 UNITS) capsule Take 400 Units by mouth daily.       Contact information for follow-up providers    Stark Klein, MD Follow up in 2 week(s).   Specialty: General Surgery Contact information: [place] [address] [phone]            Contact information for after-discharge care    Destination    [facility] .   Service: Skilled Nursing Contact information: [place] [phone]                  Signed: Stark Klein 02/17/2020, 1:13 AM

## 2020-02-18 ENCOUNTER — Telehealth: Payer: Self-pay | Admitting: Internal Medicine

## 2020-02-18 NOTE — Telephone Encounter (Signed)
Pt discharged 7/17 to [facility] but per pt will not be admitted to SNF there and requesting H/H services   Can you all order PT/OT/nursing if indicated as pt is requesting services after surgery and discharge for surgery 02/17/20?  Thanks Dr. Kelly Services

## 2020-02-19 DIAGNOSIS — C259 Malignant neoplasm of pancreas, unspecified: Secondary | ICD-10-CM | POA: Diagnosis not present

## 2020-02-19 LAB — SURGICAL PATHOLOGY

## 2020-02-25 ENCOUNTER — Other Ambulatory Visit: Payer: Self-pay | Admitting: Psychiatry

## 2020-02-25 DIAGNOSIS — F419 Anxiety disorder, unspecified: Secondary | ICD-10-CM

## 2020-02-26 ENCOUNTER — Ambulatory Visit (INDEPENDENT_AMBULATORY_CARE_PROVIDER_SITE_OTHER): Payer: PPO | Admitting: *Deleted

## 2020-02-26 DIAGNOSIS — I480 Paroxysmal atrial fibrillation: Secondary | ICD-10-CM

## 2020-02-26 LAB — CUP PACEART REMOTE DEVICE CHECK
Date Time Interrogation Session: 20210725233841
Implantable Pulse Generator Implant Date: 20201210

## 2020-02-27 NOTE — Progress Notes (Signed)
Carelink Summary Report / Loop Recorder 

## 2020-02-28 ENCOUNTER — Other Ambulatory Visit: Payer: Self-pay

## 2020-03-01 ENCOUNTER — Other Ambulatory Visit: Payer: Self-pay

## 2020-03-01 ENCOUNTER — Encounter: Payer: Self-pay | Admitting: Internal Medicine

## 2020-03-01 ENCOUNTER — Ambulatory Visit (INDEPENDENT_AMBULATORY_CARE_PROVIDER_SITE_OTHER): Payer: PPO | Admitting: Internal Medicine

## 2020-03-01 VITALS — BP 106/68 | HR 92 | Temp 98.4°F | Ht 69.0 in | Wt 169.8 lb

## 2020-03-01 DIAGNOSIS — D490 Neoplasm of unspecified behavior of digestive system: Secondary | ICD-10-CM | POA: Diagnosis not present

## 2020-03-01 DIAGNOSIS — F329 Major depressive disorder, single episode, unspecified: Secondary | ICD-10-CM

## 2020-03-01 DIAGNOSIS — F32A Depression, unspecified: Secondary | ICD-10-CM

## 2020-03-01 DIAGNOSIS — J701 Chronic and other pulmonary manifestations due to radiation: Secondary | ICD-10-CM | POA: Diagnosis not present

## 2020-03-01 DIAGNOSIS — R911 Solitary pulmonary nodule: Secondary | ICD-10-CM | POA: Diagnosis not present

## 2020-03-01 DIAGNOSIS — F419 Anxiety disorder, unspecified: Secondary | ICD-10-CM

## 2020-03-01 DIAGNOSIS — R413 Other amnesia: Secondary | ICD-10-CM

## 2020-03-01 DIAGNOSIS — G4700 Insomnia, unspecified: Secondary | ICD-10-CM | POA: Diagnosis not present

## 2020-03-01 NOTE — Progress Notes (Addendum)
Chief Complaint  Patient presents with  . Hospitalization Follow-up   HFU  1. 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 Barry Dienes I have disc'ed this with Dr. Barry Dienes who was going to repeat MRI in 1 year and pt would like serial f/u sooner than this.  Disc management of this is CT chest further w/u spread, surgery and if progressing to cancer then management from there Also CC Dr. Allen Norris GI to f/u with pt about this as this is surgery and GI and not primary care issue  She has surgical scars healing and abdominal bloating at times nausea not able to eat more than 6-8 small meals a day and if overeats will throw up since surgery   2. Insomnia/depression/anxiety worsening since #1 did not pick up Ativan 0.5 tid prn pt needs to f/u with Dr. Clovis Pu  And she is having short term memory loss   ROS Past Medical History:  Diagnosis Date  . Anxiety    controlled with meds  . Anxiety   . Arrhythmia   . Atrial fibrillation ([name])    paroxysmal, failed medical therapy with flecainide,  NSVT with tikosyn  . Bruises easily   . CVA (cerebral vascular accident) ([place]) 12/2007  . Depression    medically controlled   . Glaucoma    also with macular holes AE Dr. Thomasene Ripple  . History of loop recorder   . Hypothyroidism   . Low blood pressure    no falls, but if gets up to fast  . RLS (restless legs syndrome)   . Stroke [facility]) 7 years ago   Lasting effects on balance, different personality.  . Thyroid disease   . Tremor   . 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 ATRIAL FIBRILLATION ABLATION  10/18/12   PVI by Dr Rayann Heman  . ATRIAL FIBRILLATION ABLATION N/A 10/18/2012   Procedure: ATRIAL FIBRILLATION ABLATION;  Surgeon: Thompson Grayer, MD;  Location: [facility] CATH LAB;  Service: Cardiovascular;  Laterality: N/A;  . ATRIAL FIBRILLATION ABLATION N/A 04/18/2019    Procedure: ATRIAL FIBRILLATION ABLATION;  Surgeon: Thompson Grayer, MD;  Location: [facility] CV LAB;  Service: Cardiovascular;  Laterality: N/A;  . EUS N/A 10/19/2019   Procedure: FULL UPPER ENDOSCOPIC ULTRASOUND (EUS) RADIAL;  Surgeon: Holly Bodily, MD;  Location: [facility] ENDOSCOPY;  Service: Gastroenterology;  Laterality: N/A;  . EYE SURGERY     on L/R eye, macular hole   . FRACTURE SURGERY    . implantable loop recorder placement  07/13/2019   MDT Reveal LINQ1 [facility] [id number]) implanted in office by Dr Rayann Heman for afib management post ablation  . OPEN REDUCTION INTERNAL FIXATION (ORIF) DISTAL RADIAL FRACTURE Right 11/15/2014   Procedure: OPEN REDUCTION INTERNAL FIXATION (ORIF) RIGHT DISTAL RADIAL FRACTURE WITH ALLOGRAFT BONE GRAFT;  Surgeon: Roseanne Kaufman, MD;  Location: [facility];  Service: Orthopedics;  Laterality: Right;  . PANCREATECTOMY N/A 02/13/2020   Procedure: LAPAROSCOPIC DISTAL PANCREATECTOMY;  Surgeon: Stark Klein, MD;  Location: [place];  Service: General;  Laterality: N/A;  . TEE WITHOUT CARDIOVERSION N/A 10/18/2012   Procedure: TRANSESOPHAGEAL ECHOCARDIOGRAM (TEE);  Surgeon: Lelon Perla, MD;  Location: [facility] ENDOSCOPY;  Service: Cardiovascular;  Laterality: N/A;  Pre-Ablation at 12pm  . TONSILLECTOMY    . VAGINAL HYSTERECTOMY     Family History  Problem Relation Age of Onset  .  Multiple sclerosis Father   . Breast cancer Neg Hx    Social History   Socioeconomic History  . Marital status: Widowed    Spouse name: Not on file  . Number of children: 0  . Years of education: Not on file  . Highest education level: Not on file  Occupational History  . Occupation: Retired    Fish farm manager: RETIRED  Tobacco Use  . Smoking status: Never Smoker  . Smokeless tobacco: Never Used  Vaping Use  . Vaping Use: Never used  Substance and Sexual Activity  . Alcohol use: Yes    Alcohol/week: 2.0 standard drinks    Types: 2 Standard drinks or equivalent per week     Comment: regular  . Drug use: No  . Sexual activity: Yes  Other Topics Concern  . Not on file  Social History Narrative   Lives in [place] with her husband.  No children 2 step kids.  Former Psychologist, prison and probation services   Enjoys exercise- water classes, yoga Editor, commissioning.    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      Has 1 cat lives alone no kids but see above    Social Determinants of Health   Financial Resource Strain: Low Risk   . Difficulty of Paying Living Expenses: Not hard at all  Food Insecurity: No Food Insecurity  . Worried About Charity fundraiser in the Last Year: Never true  . Ran Out of Food in the Last Year: Never true  Transportation Needs: No Transportation Needs  . Lack of Transportation (Medical): No  . Lack of Transportation (Non-Medical): No  Physical Activity: Sufficiently Active  . Days of Exercise per Week: 7 days  . Minutes of Exercise per Session: 60 min  Stress: No Stress Concern Present  . Feeling of Stress : Only a little  Social Connections: Unknown  . Frequency of Communication with Friends and Family: Three times a week  . Frequency of Social Gatherings with Friends and Family: Once a week  . Attends Religious Services: Not on file  . Active Member of Clubs or Organizations: Not on file  . Attends Archivist Meetings: Not on file  . Marital Status: Widowed  Intimate Partner Violence: Not At Risk  . Fear of Current or Ex-Partner: No  . Emotionally Abused: No  . Physically Abused: No  . Sexually Abused: No   Current Meds  Medication Sig  . acetaminophen (TYLENOL) 500 MG tablet Take 1,000 mg by mouth every 6 (six) hours as needed (pain).   Marland Kitchen amphetamine-dextroamphetamine (ADDERALL XR) 10 MG 24 hr capsule Take 1 capsule (10 mg total) by mouth daily.  . B Complex-C (B-COMPLEX WITH VITAMIN C) tablet Take 1 tablet by mouth daily.  . blood  glucose meter kit and supplies KIT Dispense based on patient and insurance preference. Use up to four times daily as directed. (FOR ICD-9 250.00, 250.01).  . Calcium Carb-Cholecalciferol (CALCIUM 600+D3 PO) Take 2 tablets by mouth daily.  . Cholecalciferol (VITAMIN D3) 50 MCG (2000 UT) TABS Take 4,000 Units by mouth daily.  Marland Kitchen CRANBERRY EXTRACT PO Take 2 tablets by mouth every evening.   . denosumab (PROLIA) 60 MG/ML SOLN injection Inject 60 mg into the skin every 6 (six) months.   . Dextran POWD by Does not apply route.  Marland Kitchen  diltiazem (CARDIZEM CD) 240 MG 24 hr capsule TAKE 1 CAPSULE (240 MG TOTAL) BY MOUTH DAILY. MAY TAKE EXTRA CAPSULE DAILY AS NEEDED FOR AFIB  . DULoxetine (CYMBALTA) 60 MG capsule TAKE 1 CAPSULE (60 MG TOTAL) BY MOUTH 2 (TWO) TIMES DAILY.  Marland Kitchen ELIQUIS 5 MG TABS tablet TAKE 1 TABLET BY MOUTH TWICE A DAY (Patient taking differently: Take 5 mg by mouth in the morning and at bedtime. )  . estradiol (ESTRACE VAGINAL) 0.1 MG/GM vaginal cream Apply 0.'5mg'$  (pea-sized amount)  just inside the vaginal introitus with a finger-tip on Monday, Wednesday and Friday nights.  . flecainide (TAMBOCOR) 100 MG tablet TAKE 1 TABLET BY MOUTH TWICE A DAY (Patient taking differently: Take 100 mg by mouth in the morning and at bedtime. )  . gabapentin (NEURONTIN) 100 MG capsule Take 2 capsules (200 mg total) by mouth 2 (two) times daily.  Marland Kitchen levothyroxine (SYNTHROID) 25 MCG tablet Take 1 tablet (25 mcg total) by mouth daily. 30 minutes before food (Patient taking differently: Take 25 mcg by mouth daily before breakfast. 30 minutes before food)  . LORazepam (ATIVAN) 0.5 MG tablet Take 1 tablet (0.5 mg total) by mouth every 8 (eight) hours.  . Melatonin 10 MG TABS Take 2.5-5 mg by mouth at bedtime as needed (sleep.).  Marland Kitchen methocarbamol (ROBAXIN) 500 MG tablet Take 1 tablet (500 mg total) by mouth every 6 (six) hours as needed for muscle spasms.  . Multiple Vitamin (MULTIVITAMIN WITH MINERALS) TABS tablet Take 1  tablet by mouth daily. One-A-Day  . ondansetron (ZOFRAN-ODT) 4 MG disintegrating tablet Take 1 tablet (4 mg total) by mouth every 6 (six) hours as needed for nausea.  Marland Kitchen oxyCODONE (OXY IR/ROXICODONE) 5 MG immediate release tablet Take 1-2 tablets (5-10 mg total) by mouth every 4 (four) hours as needed for moderate pain, severe pain or breakthrough pain.  . pravastatin (PRAVACHOL) 20 MG tablet Take 1 tablet (20 mg total) by mouth at bedtime. (Patient taking differently: Take 10 mg by mouth at bedtime. )  . SALT SUBSTITUTES PO Take 1 tablet by mouth daily as needed (hypotension (prevention of syncope episode)).   Marland Kitchen timolol (TIMOPTIC) 0.5 % ophthalmic solution Place 1 drop into the left eye at bedtime.   . traMADol (ULTRAM) 50 MG tablet Take 1 tablet (50 mg total) by mouth every 6 (six) hours as needed (mild pain).  . vitamin C (ASCORBIC ACID) 500 MG tablet Take 1,000 mg by mouth daily.   . vitamin E 400 UNIT capsule Take 400 Units by mouth daily.     Allergies  Allergen Reactions  . Darvon Other (See Comments)    Severe panic attacks  . Propoxyphene Other (See Comments)    GI Upset Severe panic attacks GI Upset  . Propoxyphene N-Acetaminophen Other (See Comments)    Severe panic attacks  . Levofloxacin Anxiety and Other (See Comments)    Causes panic attacks.   Recent Results (from the past 2160 hour(s))  CUP PACEART REMOTE DEVICE CHECK     Status: None   Collection Time: 12/16/19 11:41 PM  Result Value Ref Range   Date Time Interrogation Session [id number]    Pulse Generator Manufacturer MERM    Pulse Gen Model PRF16 Reveal LINQ    Pulse Gen Serial Number [id number]    Clinic Name [facility] Pulse Generator Type ICM/ILR    Implantable Pulse Generator Implant Date [id number]   CUP PACEART REMOTE DEVICE CHECK     Status: None  Collection Time: 01/21/20 11:23 PM  Result Value Ref Range   Date Time Interrogation Session [id number]    Pulse Generator  Manufacturer MERM    Pulse Gen Model JJH41 Reveal LINQ    Pulse Gen Serial Number [id number]    Clinic Name [facility]    Implantable Pulse Generator Type ICM/ILR    Implantable Pulse Generator Implant Date [id number]   Type and screen     Status: None   Collection Time: 02/06/20 10:15 AM  Result Value Ref Range   ABO/RH(D) A POS    Antibody Screen NEG    Sample Expiration 02/20/2020,2359    Extend sample reason      NO TRANSFUSIONS OR PREGNANCY IN THE PAST 3 MONTHS Performed at [facility], [address], [place], [place] [address]   CBC WITH DIFFERENTIAL     Status: None   Collection Time: 02/06/20 10:22 AM  Result Value Ref Range   WBC 6.2 4.0 - 10.5 K/uL   RBC 4.51 3.87 - 5.11 MIL/uL   Hemoglobin 13.8 12.0 - 15.0 g/dL   HCT 42.8 36 - 46 %   MCV 94.9 80.0 - 100.0 fL   MCH 30.6 26.0 - 34.0 pg   MCHC 32.2 30.0 - 36.0 g/dL   RDW 14.4 11.5 - 15.5 %   Platelets 290 150 - 400 K/uL   nRBC 0.0 0.0 - 0.2 %   Neutrophils Relative % 49 %   Neutro Abs 3.0 1.7 - 7.7 K/uL   Lymphocytes Relative 34 %   Lymphs Abs 2.1 0.7 - 4.0 K/uL   Monocytes Relative 11 %   Monocytes Absolute 0.7 0 - 1 K/uL   Eosinophils Relative 5 %   Eosinophils Absolute 0.3 0 - 0 K/uL   Basophils Relative 1 %   Basophils Absolute 0.0 0 - 0 K/uL   Immature Granulocytes 0 %   Abs Immature Granulocytes 0.01 0.00 - 0.07 K/uL    Comment: Performed at [facility], [address], [place], [place] [address]  Comprehensive metabolic panel     Status: Abnormal   Collection Time: 02/06/20 10:22 AM  Result Value Ref Range   Sodium 137 135 - 145 mmol/L   Potassium 4.2 3.5 - 5.1 mmol/L    Comment: SLIGHT HEMOLYSIS   Chloride 99 98 - 111 mmol/L   CO2 27 22 - 32 mmol/L   Glucose, Bld 77 70 - 99 mg/dL    Comment: Glucose reference range applies only to samples taken after fasting for at least 8 hours.   BUN 25 (H) 8 - 23 mg/dL   Creatinine, Ser 0.63 0.44 - 1.00 mg/dL   Calcium 9.6 8.9 - 10.3 mg/dL    Total Protein 7.5 6.5 - 8.1 g/dL   Albumin 4.2 3.5 - 5.0 g/dL   AST 28 15 - 41 U/L   ALT 20 0 - 44 U/L   Alkaline Phosphatase 56 38 - 126 U/L   Total Bilirubin 0.8 0.3 - 1.2 mg/dL   GFR calc non Af Amer >60 >60 mL/min   GFR calc Af Amer >60 >60 mL/min   Anion gap 11 5 - 15    Comment: Performed at [facility], [place] [address] [place],  [address]  Lipase, blood     Status: None   Collection Time: 02/06/20 10:22 AM  Result Value Ref Range   Lipase 51 11 - 51 U/L    Comment: Performed at [facility],  [address], [place], [place] [address]  Urinalysis, Routine w reflex microscopic     Status: Abnormal   Collection Time: 02/06/20 10:22 AM  Result Value Ref Range   Color, Urine AMBER (A) YELLOW    Comment: BIOCHEMICALS MAY BE AFFECTED BY COLOR   APPearance HAZY (A) CLEAR   Specific Gravity, Urine 1.012 1.005 - 1.030   pH 6.0 5.0 - 8.0   Glucose, UA NEGATIVE NEGATIVE mg/dL   Hgb urine dipstick NEGATIVE NEGATIVE   Bilirubin Urine NEGATIVE NEGATIVE   Ketones, ur NEGATIVE NEGATIVE mg/dL   Protein, ur NEGATIVE NEGATIVE mg/dL   Nitrite NEGATIVE NEGATIVE   Leukocytes,Ua NEGATIVE NEGATIVE    Comment: Performed at [facility] [address], [place], [place] [address]  SARS CORONAVIRUS 2 (TAT 6-24 HRS) Nasopharyngeal Nasopharyngeal Swab     Status: None   Collection Time: 02/09/20  2:26 PM   Specimen: Nasopharyngeal Swab  Result Value Ref Range   SARS Coronavirus 2 NEGATIVE NEGATIVE    Comment: (NOTE) SARS-CoV-2 target nucleic acids are NOT DETECTED.  The SARS-CoV-2 RNA is generally detectable in upper and lower respiratory specimens during the acute phase of infection. Negative results do not preclude SARS-CoV-2 infection, do not rule out co-infections with other pathogens, and should not be used as the sole basis for treatment or other patient management decisions. Negative results must be combined with clinical observations, patient history, and  epidemiological information. The expected result is Negative.  Fact Sheet for Patients: SugarRoll.be  Fact Sheet for Healthcare Providers: https://www.woods-mathews.com/  This test is not yet approved or cleared by the [place] FDA and  has been authorized for detection and/or diagnosis of SARS-CoV-2 by FDA under an Emergency Use Authorization (EUA). This EUA will remain  in effect (meaning this test can be used) for the duration of the COVID-19 declaration under Se ction 564(b)(1) of the Act, 21 U.S.C. section 360bbb-3(b)(1), unless the authorization is terminated or revoked sooner.  Performed at [facility], [place] [address], [place], [place] [address]   ABO/Rh     Status: None   Collection Time: 02/13/20 10:16 AM  Result Value Ref Range   ABO/RH(D)      A POS Performed at [facility] [address] [place], [place] [address]   Surgical pathology     Status: None   Collection Time: 02/13/20  2:00 PM  Result Value Ref Range   SURGICAL PATHOLOGY      SURGICAL PATHOLOGY CASE: [id number] PATIENT: Linden Soltys Surgical Pathology Report     Clinical History: cystic pancreatic mass (cm)     FINAL MICROSCOPIC DIAGNOSIS:  A. PANCREAS, DISTAL, RESECTION: - Intraductal papillary mucinous neoplasm with high grade dysplasia (PanIN 3). - Neoplasm with low grade dysplasia (PanIN 1 and 2) extends to pancreatic resection margin. - Three of three lymph nodes negative for tumor (0/3). - See comment.  COMMENT:  Dr. Vic Ripper has reviewed the case.   GROSS DESCRIPTION:  The specimen is received fresh in construction of a focally disrupted distal pancreas, measuring 8.3 x 3.5 x 2.2 cm.  The proximal end displays a stapled resection margin, with a 2.0 cm previous incision identified perpendicular to the staple line.  Sectioning reveals a tan-yellow parenchyma, with numerous smooth-walled cystic  structures, filled with clear mucinous fluid.  The cysts extend through the length of the specimen.  The cysts range from  0.2 to 1.8 cm in greatest dimension.  Possible pancreatic duct appears dilated  throughout the specimen.  No possible lymph nodes are grossly identified within the surrounding adipose tissue. Representative sections are submitted in 18 cassettes. 1 = pancreatic resection margin 2-10 = representative sections along the length of the pancreas 11-18 = surrounding fat, entirely submitted Craig Staggers 02/19/2020)    Final Diagnosis performed by Vicente Males, MD.   Electronically signed 02/19/2020 Technical component performed at [employer]. [facility], [place] [address], [place], [place] [address].  Professional component performed at [facility], [place] [address], [place], [place] [address].  Immunohistochemistry Technical component (if applicable) was performed at [facility]. [address], [place], [place], [place] [address].   IMMUNOHISTOCHEMISTRY DISCLAIMER (if applicable): Some of these immunohistochemical stains may have been develop ed and the performance characteristics determine by [facility]. Some may not have been cleared or approved by the U.S. Food and Drug Administration. The FDA has determined that such clearance or approval is not necessary. This test is used for clinical purposes. It should not be regarded as investigational or for research. This laboratory is certified under the [place] (CLIA-88) as qualified to perform high complexity clinical laboratory testing.  The controls stained appropriately.   CBC     Status: Abnormal   Collection Time: 02/13/20  5:54 PM  Result Value Ref Range   WBC 11.3 (H) 4.0 - 10.5 K/uL   RBC 4.12 3.87 - 5.11 MIL/uL   Hemoglobin 12.5 12.0 - 15.0 g/dL   HCT 38.6 36 - 46 %   MCV 93.7 80.0 - 100.0 fL   MCH 30.3 26.0 - 34.0 pg    MCHC 32.4 30.0 - 36.0 g/dL   RDW 14.4 11.5 - 15.5 %   Platelets 263 150 - 400 K/uL   nRBC 0.0 0.0 - 0.2 %    Comment: Performed at [facility], [place] [address], [place], [place] [address]  Creatinine, serum     Status: None   Collection Time: 02/13/20  5:54 PM  Result Value Ref Range   Creatinine, Ser 0.64 0.44 - 1.00 mg/dL   GFR calc non Af Amer >60 >60 mL/min   GFR calc Af Amer >60 >60 mL/min    Comment: Performed at [facility] [address] [place], [place] [address]  Basic metabolic panel     Status: Abnormal   Collection Time: 02/14/20  4:15 AM  Result Value Ref Range   Sodium 133 (L) 135 - 145 mmol/L   Potassium 4.6 3.5 - 5.1 mmol/L   Chloride 101 98 - 111 mmol/L   CO2 22 22 - 32 mmol/L   Glucose, Bld 208 (H) 70 - 99 mg/dL    Comment: Glucose reference range applies only to samples taken after fasting for at least 8 hours.   BUN 12 8 - 23 mg/dL   Creatinine, Ser 0.57 0.44 - 1.00 mg/dL   Calcium 7.9 (L) 8.9 - 10.3 mg/dL   GFR calc non Af Amer >60 >60 mL/min   GFR calc Af Amer >60 >60 mL/min   Anion gap 10 5 - 15    Comment: Performed at [facility] [address], [place] [address]  CBC     Status: None   Collection Time: 02/14/20  4:15 AM  Result Value Ref Range   WBC 7.7 4.0 - 10.5 K/uL   RBC 4.23 3.87 - 5.11 MIL/uL   Hemoglobin 13.0 12.0 - 15.0 g/dL  HCT 39.1 36 - 46 %   MCV 92.4 80.0 - 100.0 fL   MCH 30.7 26.0 - 34.0 pg   MCHC 33.2 30.0 - 36.0 g/dL   RDW 14.1 11.5 - 15.5 %   Platelets 246 150 - 400 K/uL   nRBC 0.0 0.0 - 0.2 %    Comment: Performed at [facility] [address] [place], [place] [address]  Hemoglobin A1c     Status: None   Collection Time: 02/14/20  1:05 PM  Result Value Ref Range   Hgb A1c MFr Bld 5.6 4.8 - 5.6 %    Comment: (NOTE) Pre diabetes:          5.7%-6.4%  Diabetes:              >6.4%  Glycemic control for   <7.0% adults with diabetes    Mean Plasma Glucose 114.02 mg/dL    Comment:  Performed at [facility] [address] [place], [place] [address]  Glucose, capillary     Status: Abnormal   Collection Time: 02/14/20  7:42 PM  Result Value Ref Range   Glucose-Capillary 163 (H) 70 - 99 mg/dL    Comment: Glucose reference range applies only to samples taken after fasting for at least 8 hours.  Glucose, capillary     Status: Abnormal   Collection Time: 02/14/20  9:19 PM  Result Value Ref Range   Glucose-Capillary 191 (H) 70 - 99 mg/dL    Comment: Glucose reference range applies only to samples taken after fasting for at least 8 hours.  Basic metabolic panel     Status: Abnormal   Collection Time: 02/15/20  4:15 AM  Result Value Ref Range   Sodium 132 (L) 135 - 145 mmol/L   Potassium 5.1 3.5 - 5.1 mmol/L    Comment: SPECIMEN HEMOLYZED. HEMOLYSIS MAY AFFECT INTEGRITY OF RESULTS.   Chloride 104 98 - 111 mmol/L   CO2 20 (L) 22 - 32 mmol/L   Glucose, Bld 140 (H) 70 - 99 mg/dL    Comment: Glucose reference range applies only to samples taken after fasting for at least 8 hours.   BUN 12 8 - 23 mg/dL   Creatinine, Ser 0.57 0.44 - 1.00 mg/dL   Calcium 7.6 (L) 8.9 - 10.3 mg/dL   GFR calc non Af Amer >60 >60 mL/min   GFR calc Af Amer >60 >60 mL/min   Anion gap 8 5 - 15    Comment: Performed at [facility] [address], [place], [place] [address]  CBC     Status: Abnormal   Collection Time: 02/15/20  4:15 AM  Result Value Ref Range   WBC 8.9 4.0 - 10.5 K/uL   RBC 3.56 (L) 3.87 - 5.11 MIL/uL   Hemoglobin 10.9 (L) 12.0 - 15.0 g/dL   HCT 33.5 (L) 36 - 46 %   MCV 94.1 80.0 - 100.0 fL   MCH 30.6 26.0 - 34.0 pg   MCHC 32.5 30.0 - 36.0 g/dL   RDW 14.1 11.5 - 15.5 %   Platelets 199 150 - 400 K/uL   nRBC 0.0 0.0 - 0.2 %    Comment: Performed at [facility], [place] [address], [place], [place] [address]  Glucose, capillary     Status: Abnormal   Collection Time: 02/15/20  9:20 AM  Result Value Ref Range   Glucose-Capillary 135 (H) 70 - 99 mg/dL     Comment: Glucose reference  range applies only to samples taken after fasting for at least 8 hours.  Glucose, capillary     Status: Abnormal   Collection Time: 02/15/20 11:43 AM  Result Value Ref Range   Glucose-Capillary 101 (H) 70 - 99 mg/dL    Comment: Glucose reference range applies only to samples taken after fasting for at least 8 hours.  Glucose, capillary     Status: None   Collection Time: 02/15/20  4:20 PM  Result Value Ref Range   Glucose-Capillary 96 70 - 99 mg/dL    Comment: Glucose reference range applies only to samples taken after fasting for at least 8 hours.  Glucose, capillary     Status: Abnormal   Collection Time: 02/15/20 10:09 PM  Result Value Ref Range   Glucose-Capillary 104 (H) 70 - 99 mg/dL    Comment: Glucose reference range applies only to samples taken after fasting for at least 8 hours.  Basic metabolic panel     Status: Abnormal   Collection Time: 02/16/20  6:52 AM  Result Value Ref Range   Sodium 135 135 - 145 mmol/L   Potassium 4.2 3.5 - 5.1 mmol/L   Chloride 102 98 - 111 mmol/L   CO2 25 22 - 32 mmol/L   Glucose, Bld 119 (H) 70 - 99 mg/dL    Comment: Glucose reference range applies only to samples taken after fasting for at least 8 hours.   BUN 8 8 - 23 mg/dL   Creatinine, Ser 0.50 0.44 - 1.00 mg/dL   Calcium 8.3 (L) 8.9 - 10.3 mg/dL   GFR calc non Af Amer >60 >60 mL/min   GFR calc Af Amer >60 >60 mL/min   Anion gap 8 5 - 15    Comment: Performed at [facility] [address], [place],  [address]  CBC     Status: Abnormal   Collection Time: 02/16/20  6:52 AM  Result Value Ref Range   WBC 7.4 4.0 - 10.5 K/uL   RBC 3.77 (L) 3.87 - 5.11 MIL/uL   Hemoglobin 11.3 (L) 12.0 - 15.0 g/dL   HCT 34.8 (L) 36 - 46 %   MCV 92.3 80.0 - 100.0 fL   MCH 30.0 26.0 - 34.0 pg   MCHC 32.5 30.0 - 36.0 g/dL   RDW 14.3 11.5 - 15.5 %   Platelets 229 150 - 400 K/uL   nRBC 0.0 0.0 - 0.2 %    Comment: Performed at [facility], [place] [address], [place], [place] [address]  Glucose, capillary     Status: Abnormal   Collection Time: 02/16/20  7:57 AM  Result Value Ref Range   Glucose-Capillary 119 (H) 70 - 99 mg/dL    Comment: Glucose reference range applies only to samples taken after fasting for at least 8 hours.  Glucose, capillary     Status: Abnormal   Collection Time: 02/16/20 10:58 AM  Result Value Ref Range   Glucose-Capillary 108 (H) 70 - 99 mg/dL    Comment: Glucose reference range applies only to samples taken after fasting for at least 8 hours.  Glucose, capillary     Status: Abnormal   Collection Time: 02/16/20  4:55 PM  Result Value Ref Range   Glucose-Capillary 132 (H) 70 - 99 mg/dL    Comment: Glucose reference range applies only to samples taken after fasting for at least 8 hours.  Glucose, capillary     Status: Abnormal   Collection Time: 02/16/20  9:48 PM  Result Value Ref Range   Glucose-Capillary 111 (H) 70 - 99 mg/dL    Comment: Glucose reference range applies only to samples taken after fasting for at least 8 hours.   Comment 1 Notify RN    Comment 2 Document in Chart   Basic metabolic panel     Status: Abnormal   Collection Time: 02/17/20  7:02 AM  Result Value Ref Range   Sodium 137 135 - 145 mmol/L   Potassium 4.1 3.5 - 5.1 mmol/L   Chloride 101 98 - 111 mmol/L   CO2 26 22 - 32 mmol/L   Glucose, Bld 104 (H) 70 - 99 mg/dL    Comment: Glucose reference range applies only to samples taken after fasting for at least 8 hours.   BUN 10 8 - 23 mg/dL   Creatinine, Ser 0.61 0.44 - 1.00 mg/dL   Calcium 8.3 (L) 8.9 - 10.3 mg/dL   GFR calc non Af Amer >60 >60 mL/min   GFR calc Af Amer >60 >60 mL/min   Anion gap 10 5 - 15    Comment: Performed at [facility] [address], [place], [place] [address]  CBC     Status: None   Collection Time: 02/17/20  7:02 AM  Result Value Ref Range   WBC 6.7 4.0 - 10.5 K/uL   RBC 4.13 3.87 - 5.11 MIL/uL   Hemoglobin 12.9 12.0 - 15.0 g/dL   HCT 38.6 36 - 46 %    MCV 93.5 80.0 - 100.0 fL   MCH 31.2 26.0 - 34.0 pg   MCHC 33.4 30.0 - 36.0 g/dL   RDW 14.3 11.5 - 15.5 %   Platelets 233 150 - 400 K/uL   nRBC 0.0 0.0 - 0.2 %    Comment: Performed at [facility], [place] [address], [place], [place] [address]  Glucose, capillary     Status: None   Collection Time: 02/17/20  8:02 AM  Result Value Ref Range   Glucose-Capillary 98 70 - 99 mg/dL    Comment: Glucose reference range applies only to samples taken after fasting for at least 8 hours.  Glucose, capillary     Status: Abnormal   Collection Time: 02/17/20 11:18 AM  Result Value Ref Range   Glucose-Capillary 151 (H) 70 - 99 mg/dL    Comment: Glucose reference range applies only to samples taken after fasting for at least 8 hours.  CUP PACEART REMOTE DEVICE CHECK     Status: None   Collection Time: 02/25/20 11:38 PM  Result Value Ref Range   Date Time Interrogation Session [id number]    Pulse Generator Manufacturer MERM    Pulse Gen Model [id number] Reveal [id number]    Pulse Gen Serial Number [id number]    Clinic Name [facility]    Implantable Pulse Generator Type ICM/ILR    Implantable Pulse Generator Implant Date [id number]    Eval Rhythm SR at 64 bpm    Objective  Body mass index is 25.08 kg/m. Wt Readings from Last 3 Encounters:  03/01/20 169 lb 12.8 oz (77 kg)  02/13/20 170 lb (77.1 kg)  02/06/20 179 lb 1.6 oz (81.2 kg)   Temp Readings from Last 3 Encounters:  03/01/20 98.4 F (36.9 C) (Oral)  02/16/20 98.3 F (36.8 C) (Oral)  02/06/20 98.3 F (36.8 C) (Oral)   BP Readings from Last 3 Encounters:  03/01/20 106/68  02/17/20 (!) 98/51  02/06/20 (!) 113/58   Pulse Readings  from Last 3 Encounters:  03/01/20 92  02/16/20 78  02/06/20 78    Physical Exam Vitals and nursing note reviewed.  Constitutional:      Appearance: Normal appearance. She is well-developed and well-groomed.  HENT:     Head: Normocephalic and atraumatic.  Eyes:     Conjunctiva/sclera: Conjunctivae  normal.     Pupils: Pupils are equal, round, and reactive to light.  Cardiovascular:     Rate and Rhythm: Normal rate and regular rhythm.     Heart sounds: Normal heart sounds. No murmur heard.   Pulmonary:     Effort: Pulmonary effort is normal.     Breath sounds: Normal breath sounds.  Abdominal:     Tenderness: There is no abdominal tenderness.     Comments: Multiple scars to ab  No ttp Some mild bloating  Skin:    General: Skin is warm and dry.  Neurological:     General: No focal deficit present.     Mental Status: She is alert and oriented to person, place, and time. Mental status is at baseline.     Gait: Gait normal.  Psychiatric:        Attention and Perception: Attention and perception normal.        Mood and Affect: Mood and affect normal.        Speech: Speech normal.        Behavior: Behavior normal. Behavior is cooperative.        Thought Content: Thought content normal.        Cognition and Memory: Cognition and memory normal.        Judgment: Judgment normal.     Assessment  Plan  IPMN (intraductal papillary mucinous neoplasm) - Plan: CT Chest Wo Contrast F/u GI and surgery  Serial MRI/CT imaging to f/u if progressing to cancer will need chemo or tx based on this  Will need close f/u GI and surgery (appt surgery next week) Consider EUS/ERCP per up to date  3 lymph nodes negative  Per Dr. 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 I would be planning on following her with MRI. I went as far back as possible with a distal panc. Given the low grade dysplasia, I wouldn't do further surgery for her, just follow with imaging and +/- periodic EUS depending on the MR findings.     Scarring of lung following radiation ([place]) - Plan: CT Chest Wo Contrast Lung nodule - Plan: CT Chest Wo Contrast  Anxiety and depression Insomnia, unspecified type Has ativan 0.5 tid per psych needs to f/u with Dr. Clovis Pu   Memory loss    Provider: Dr. Olivia Mackie McLean-Scocuzza-Internal Medicine

## 2020-03-01 NOTE — Patient Instructions (Addendum)
Ativan 0.5 3 xper day as needed sleep/anxiety  Follow up with Dr. Clovis Pu   Insomnia Insomnia is a sleep disorder that makes it difficult to fall asleep or stay asleep. Insomnia can cause fatigue, low energy, difficulty concentrating, mood swings, and poor performance at work or school. There are three different ways to classify insomnia:  Difficulty falling asleep.  Difficulty staying asleep.  Waking up too early in the morning. Any type of insomnia can be long-term (chronic) or short-term (acute). Both are common. Short-term insomnia usually lasts for three months or less. Chronic insomnia occurs at least three times a week for longer than three months. What are the causes? Insomnia may be caused by another condition, situation, or substance, such as:  Anxiety.  Certain medicines.  Gastroesophageal reflux disease (GERD) or other gastrointestinal conditions.  Asthma or other breathing conditions.  Restless legs syndrome, sleep apnea, or other sleep disorders.  Chronic pain.  Menopause.  Stroke.  Abuse of alcohol, tobacco, or illegal drugs.  Mental health conditions, such as depression.  Caffeine.  Neurological disorders, such as Alzheimer's disease.  An overactive thyroid (hyperthyroidism). Sometimes, the cause of insomnia may not be known. What increases the risk? Risk factors for insomnia include:  Gender. Women are affected more often than men.  Age. Insomnia is more common as you get older.  Stress.  Lack of exercise.  Irregular work schedule or working night shifts.  Traveling between different time zones.  Certain medical and mental health conditions. What are the signs or symptoms? If you have insomnia, the main symptom is having trouble falling asleep or having trouble staying asleep. This may lead to other symptoms, such as:  Feeling fatigued or having low energy.  Feeling nervous about going to sleep.  Not feeling rested in the  morning.  Having trouble concentrating.  Feeling irritable, anxious, or depressed. How is this diagnosed? This condition may be diagnosed based on:  Your symptoms and medical history. Your health care provider may ask about: ? Your sleep habits. ? Any medical conditions you have. ? Your mental health.  A physical exam. How is this treated? Treatment for insomnia depends on the cause. Treatment may focus on treating an underlying condition that is causing insomnia. Treatment may also include:  Medicines to help you sleep.  Counseling or therapy.  Lifestyle adjustments to help you sleep better. Follow these instructions at home: Eating and drinking   Limit or avoid alcohol, caffeinated beverages, and cigarettes, especially close to bedtime. These can disrupt your sleep.  Do not eat a large meal or eat spicy foods right before bedtime. This can lead to digestive discomfort that can make it hard for you to sleep. Sleep habits   Keep a sleep diary to help you and your health care provider figure out what could be causing your insomnia. Write down: ? When you sleep. ? When you wake up during the night. ? How well you sleep. ? How rested you feel the next day. ? Any side effects of medicines you are taking. ? What you eat and drink.  Make your bedroom a dark, comfortable place where it is easy to fall asleep. ? Put up shades or blackout curtains to block light from outside. ? Use a white noise machine to block noise. ? Keep the temperature cool.  Limit screen use before bedtime. This includes: ? Watching TV. ? Using your smartphone, tablet, or computer.  Stick to a routine that includes going to bed and waking up  at the same times every day and night. This can help you fall asleep faster. Consider making a quiet activity, such as reading, part of your nighttime routine.  Try to avoid taking naps during the day so that you sleep better at night.  Get out of bed if you are  still awake after 15 minutes of trying to sleep. Keep the lights down, but try reading or doing a quiet activity. When you feel sleepy, go back to bed. General instructions  Take over-the-counter and prescription medicines only as told by your health care provider.  Exercise regularly, as told by your health care provider. Avoid exercise starting several hours before bedtime.  Use relaxation techniques to manage stress. Ask your health care provider to suggest some techniques that may work well for you. These may include: ? Breathing exercises. ? Routines to release muscle tension. ? Visualizing peaceful scenes.  Make sure that you drive carefully. Avoid driving if you feel very sleepy.  Keep all follow-up visits as told by your health care provider. This is important. Contact a health care provider if:  You are tired throughout the day.  You have trouble in your daily routine due to sleepiness.  You continue to have sleep problems, or your sleep problems get worse. Get help right away if:  You have serious thoughts about hurting yourself or someone else. If you ever feel like you may hurt yourself or others, or have thoughts about taking your own life, get help right away. You can go to your nearest emergency department or call:  Your local emergency services (911 in the [place]  A suicide crisis helpline, such as the [place] at (272)810-3953. This is open 24 hours a day. Summary  Insomnia is a sleep disorder that makes it difficult to fall asleep or stay asleep.  Insomnia can be long-term (chronic) or short-term (acute).  Treatment for insomnia depends on the cause. Treatment may focus on treating an underlying condition that is causing insomnia.  Keep a sleep diary to help you and your health care provider figure out what could be causing your insomnia. This information is not intended to replace advice given to you by your health care provider.  Make sure you discuss any questions you have with your health care provider. Document Revised: 07/02/2017 Document Reviewed: 04/29/2017 Elsevier Patient Education  2020 [employer].

## 2020-03-13 ENCOUNTER — Encounter: Payer: Self-pay | Admitting: Internal Medicine

## 2020-03-13 NOTE — Telephone Encounter (Signed)
Please advise, Patient was seen and scheduled for a CT chest 03/20/20.  Patient is also needing a mammogram, is she needing any other appointments?

## 2020-03-20 ENCOUNTER — Encounter: Payer: Self-pay | Admitting: Internal Medicine

## 2020-03-20 ENCOUNTER — Ambulatory Visit
Admission: RE | Admit: 2020-03-20 | Discharge: 2020-03-20 | Disposition: A | Discharge status: Home / Self Care | Payer: PPO | Source: Ambulatory Visit | Attending: Internal Medicine | Admitting: Internal Medicine

## 2020-03-20 ENCOUNTER — Other Ambulatory Visit: Payer: Self-pay

## 2020-03-20 DIAGNOSIS — J701 Chronic and other pulmonary manifestations due to radiation: Secondary | ICD-10-CM | POA: Diagnosis not present

## 2020-03-20 DIAGNOSIS — D490 Neoplasm of unspecified behavior of digestive system: Secondary | ICD-10-CM | POA: Insufficient documentation

## 2020-03-20 DIAGNOSIS — Z9889 Other specified postprocedural states: Secondary | ICD-10-CM | POA: Diagnosis not present

## 2020-03-20 DIAGNOSIS — R911 Solitary pulmonary nodule: Secondary | ICD-10-CM | POA: Insufficient documentation

## 2020-03-20 DIAGNOSIS — I700 Atherosclerosis of aorta: Secondary | ICD-10-CM | POA: Diagnosis not present

## 2020-03-31 LAB — CUP PACEART REMOTE DEVICE CHECK
Date Time Interrogation Session: 20210827235058
Implantable Pulse Generator Implant Date: 20201210

## 2020-04-01 ENCOUNTER — Ambulatory Visit (INDEPENDENT_AMBULATORY_CARE_PROVIDER_SITE_OTHER): Payer: PPO | Admitting: *Deleted

## 2020-04-01 DIAGNOSIS — I4819 Other persistent atrial fibrillation: Secondary | ICD-10-CM

## 2020-04-02 NOTE — Progress Notes (Signed)
Carelink Summary Report / Loop Recorder 

## 2020-04-09 ENCOUNTER — Other Ambulatory Visit: Payer: Self-pay | Admitting: Psychiatry

## 2020-04-09 DIAGNOSIS — F419 Anxiety disorder, unspecified: Secondary | ICD-10-CM

## 2020-04-09 DIAGNOSIS — Z8673 Personal history of transient ischemic attack (TIA), and cerebral infarction without residual deficits: Secondary | ICD-10-CM | POA: Diagnosis not present

## 2020-04-09 DIAGNOSIS — R251 Tremor, unspecified: Secondary | ICD-10-CM | POA: Diagnosis not present

## 2020-04-09 DIAGNOSIS — R202 Paresthesia of skin: Secondary | ICD-10-CM | POA: Diagnosis not present

## 2020-04-10 ENCOUNTER — Other Ambulatory Visit: Payer: Self-pay

## 2020-04-10 DIAGNOSIS — F908 Attention-deficit hyperactivity disorder, other type: Secondary | ICD-10-CM

## 2020-04-10 NOTE — Telephone Encounter (Signed)
Please review

## 2020-04-11 MED ORDER — AMPHETAMINE-DEXTROAMPHET ER 10 MG PO CP24: 10.0000 mg | ORAL_CAPSULE | Freq: Every day | ORAL | 0 refills | Status: DC

## 2020-04-11 NOTE — Telephone Encounter (Signed)
Noted [facility]. [facility]

## 2020-04-11 NOTE — Telephone Encounter (Signed)
Left a vm for her to return my call

## 2020-04-11 NOTE — Telephone Encounter (Signed)
[facility]

## 2020-04-11 NOTE — Telephone Encounter (Signed)
Please clarify pharmacy.

## 2020-04-16 ENCOUNTER — Encounter: Payer: Self-pay | Admitting: Internal Medicine

## 2020-04-17 ENCOUNTER — Encounter: Payer: Self-pay | Admitting: Internal Medicine

## 2020-04-18 ENCOUNTER — Telehealth: Payer: Self-pay | Admitting: Psychiatry

## 2020-04-18 NOTE — Telephone Encounter (Signed)
Pt is requesting a RF of Adderall XR to go to [facility] in [place]. Looks like [facility], but she needs to use [facility]. Can we switch that over?

## 2020-04-19 ENCOUNTER — Other Ambulatory Visit: Payer: Self-pay

## 2020-04-19 DIAGNOSIS — F908 Attention-deficit hyperactivity disorder, other type: Secondary | ICD-10-CM

## 2020-04-19 NOTE — Telephone Encounter (Signed)
Pharmacy updated and sent to [facility] to resend for Dr. Clovis Pu

## 2020-04-19 NOTE — Telephone Encounter (Signed)
Waiting on correct [employer]

## 2020-04-19 NOTE — Telephone Encounter (Signed)
[facility] : [address] [place], [place] [address]

## 2020-04-19 NOTE — Telephone Encounter (Signed)
[place], [place]

## 2020-04-19 NOTE — Telephone Encounter (Signed)
Monica Whitaker, can you call and check which [facility] in [place], looks like there are 2 and she's not used either before.

## 2020-04-20 MED ORDER — AMPHETAMINE-DEXTROAMPHET ER 10 MG PO CP24: 10.0000 mg | ORAL_CAPSULE | Freq: Every day | ORAL | 0 refills | Status: DC

## 2020-04-22 NOTE — Addendum Note (Signed)
Addended by: Orland Mustard on: 04/22/2020 09:59 PM   Modules accepted: Orders

## 2020-05-04 LAB — CUP PACEART REMOTE DEVICE CHECK
Date Time Interrogation Session: 20210929235135
Implantable Pulse Generator Implant Date: 20201210

## 2020-05-06 ENCOUNTER — Ambulatory Visit (INDEPENDENT_AMBULATORY_CARE_PROVIDER_SITE_OTHER): Payer: PPO

## 2020-05-06 DIAGNOSIS — I4819 Other persistent atrial fibrillation: Secondary | ICD-10-CM

## 2020-05-07 NOTE — Progress Notes (Signed)
Carelink Summary Report / Loop Recorder 

## 2020-05-14 DIAGNOSIS — H40052 Ocular hypertension, left eye: Secondary | ICD-10-CM | POA: Diagnosis not present

## 2020-05-21 ENCOUNTER — Encounter: Payer: Self-pay | Admitting: Internal Medicine

## 2020-05-21 ENCOUNTER — Other Ambulatory Visit: Payer: Self-pay | Admitting: Internal Medicine

## 2020-05-21 DIAGNOSIS — N3000 Acute cystitis without hematuria: Secondary | ICD-10-CM

## 2020-05-22 ENCOUNTER — Other Ambulatory Visit (INDEPENDENT_AMBULATORY_CARE_PROVIDER_SITE_OTHER): Payer: PPO

## 2020-05-22 ENCOUNTER — Telehealth: Payer: Self-pay | Admitting: Internal Medicine

## 2020-05-22 ENCOUNTER — Other Ambulatory Visit: Payer: Self-pay

## 2020-05-22 DIAGNOSIS — N3000 Acute cystitis without hematuria: Secondary | ICD-10-CM | POA: Diagnosis not present

## 2020-05-22 DIAGNOSIS — Z23 Encounter for immunization: Secondary | ICD-10-CM

## 2020-05-22 NOTE — Telephone Encounter (Signed)
Insurance verification for Prolia filed on [employer] Portal. 

## 2020-05-24 ENCOUNTER — Telehealth (INDEPENDENT_AMBULATORY_CARE_PROVIDER_SITE_OTHER): Payer: PPO | Admitting: Internal Medicine

## 2020-05-24 ENCOUNTER — Other Ambulatory Visit: Payer: Self-pay | Admitting: Internal Medicine

## 2020-05-24 DIAGNOSIS — N390 Urinary tract infection, site not specified: Secondary | ICD-10-CM | POA: Diagnosis not present

## 2020-05-24 DIAGNOSIS — B952 Enterococcus as the cause of diseases classified elsewhere: Secondary | ICD-10-CM | POA: Diagnosis not present

## 2020-05-24 LAB — URINE CULTURE
MICRO NUMBER:: 11096165
SPECIMEN QUALITY:: ADEQUATE

## 2020-05-24 LAB — URINALYSIS, ROUTINE W REFLEX MICROSCOPIC
Bacteria, UA: NONE SEEN /HPF
Bilirubin Urine: NEGATIVE
Glucose, UA: NEGATIVE
Hgb urine dipstick: NEGATIVE
Hyaline Cast: NONE SEEN /LPF
Ketones, ur: NEGATIVE
Nitrite: NEGATIVE
Protein, ur: NEGATIVE
Specific Gravity, Urine: 1.021 (ref 1.001–1.03)
pH: 5.5 (ref 5.0–8.0)

## 2020-05-24 MED ORDER — NITROFURANTOIN MONOHYD MACRO 100 MG PO CAPS: 100.0000 mg | ORAL_CAPSULE | Freq: Two times a day (BID) | ORAL | 0 refills | Status: DC

## 2020-05-24 NOTE — Telephone Encounter (Signed)
Enterococcus UTI informed pt 05/24/20 7:10 pm  Macrobid 100 mg bid x 5 days  Pt agreed to my chart fee earlier for sx's  Urine orders are in please call the office to schedule a lab visit for urine 05/22/20 and advise you have discussed with Arianna and Dr. Olivia Mackie   Thank you  Last read by Monica Whitaker at 7:21 AM on 05/23/2020. Thressa Sheller, CMA to Me     05/21/20 5:07 PM Please advise   Monica Whitaker, Monica Whitaker to Me     05/21/20 4:19 PM I took otc meds which made it much better. Couldn't pee before that. Feeling ok today except urine is dark yellow. No burning. No pain today. Consultation fee is. Fine. Thx. Monica Whitaker [phone].  Thressa Sheller, CMA to [name], Monica Whitaker     05/21/20 3:19 PM Good afternoon,  Sorry that we missed you by phone, I have left a voicemail on the number we have on file for you.   I am sorry to hear you are not feeling well. Please answer the following questions here on mychart or give [place] a call back at [phone]:  What urinary symptoms are you having? Burning, pain with urination, odor, pressure, low back pain?  How long have you had these symptoms?  In order to process lab orders and send in medications without an in office visit there is a consultation fee. Are you agreeable to this? Depending on insurance this may be free. If there is a copay it should be less than what you pay to come in to the office.   Last read by Monica Whitaker at 4:09 PM on 05/21/2020. Monica Whitaker, Monica Whitaker to Me     05/21/20 7:51 AM Got horrible bladder infection over weekend. [facility]. Am [facility] peeing purple. Should I/can I still get tested this morning? Pls have nurse call me. Thx Monica Whitaker   Time spent 5-10 minutes  Dr. Olivia Mackie McLean-scocuzza

## 2020-06-05 LAB — CUP PACEART REMOTE DEVICE CHECK
Date Time Interrogation Session: 20211101235502
Implantable Pulse Generator Implant Date: 20201210

## 2020-06-06 ENCOUNTER — Ambulatory Visit (INDEPENDENT_AMBULATORY_CARE_PROVIDER_SITE_OTHER): Payer: PPO | Admitting: Internal Medicine

## 2020-06-06 ENCOUNTER — Encounter: Payer: Self-pay | Admitting: Internal Medicine

## 2020-06-06 ENCOUNTER — Other Ambulatory Visit: Payer: Self-pay

## 2020-06-06 VITALS — BP 126/88 | HR 75 | Temp 98.2°F | Ht 69.0 in | Wt 176.0 lb

## 2020-06-06 DIAGNOSIS — Z1283 Encounter for screening for malignant neoplasm of skin: Secondary | ICD-10-CM | POA: Diagnosis not present

## 2020-06-06 DIAGNOSIS — E785 Hyperlipidemia, unspecified: Secondary | ICD-10-CM

## 2020-06-06 DIAGNOSIS — E039 Hypothyroidism, unspecified: Secondary | ICD-10-CM | POA: Diagnosis not present

## 2020-06-06 DIAGNOSIS — D229 Melanocytic nevi, unspecified: Secondary | ICD-10-CM | POA: Diagnosis not present

## 2020-06-06 DIAGNOSIS — K8689 Other specified diseases of pancreas: Secondary | ICD-10-CM | POA: Diagnosis not present

## 2020-06-06 DIAGNOSIS — I639 Cerebral infarction, unspecified: Secondary | ICD-10-CM | POA: Diagnosis not present

## 2020-06-06 DIAGNOSIS — N390 Urinary tract infection, site not specified: Secondary | ICD-10-CM | POA: Diagnosis not present

## 2020-06-06 DIAGNOSIS — Z1329 Encounter for screening for other suspected endocrine disorder: Secondary | ICD-10-CM | POA: Diagnosis not present

## 2020-06-06 MED ORDER — LEVOTHYROXINE SODIUM 25 MCG PO TABS: 25.0000 ug | ORAL_TABLET | Freq: Every day | ORAL | 3 refills | Status: DC

## 2020-06-06 MED ORDER — PRAVASTATIN SODIUM 10 MG PO TABS: 10.0000 mg | ORAL_TABLET | Freq: Every day | ORAL | 3 refills | Status: DC

## 2020-06-06 NOTE — Telephone Encounter (Signed)
Resubmitted authorization with new insurance card.

## 2020-06-06 NOTE — Patient Instructions (Signed)
[facility] [employer] Dr. Ralph Dowdy

## 2020-06-06 NOTE — Progress Notes (Signed)
Chief Complaint  Patient presents with  . Follow-up   F/u  1. 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 [place] Dienes for imaging and GI denies wt loss, ab pain, nausea  2. Enterococcus UTI resolved with macrobid bid x 5 days no sx's declines repeat urine culture  3. Will sch mammo upcoming 07/2020 or 08/2020  4. H/o HLD/CVA pravachol 10 mg qhs refilled will repeat cholesterol  5. Hypothyroidism on levo 25 mcg qd refilled today    Review of Systems  Constitutional: Negative for weight loss.  HENT: Negative for hearing loss.   Eyes: Negative for blurred vision.  Respiratory: Negative for shortness of breath.   Cardiovascular: Negative for chest pain.  Gastrointestinal: Negative for abdominal pain.  Musculoskeletal: Negative for falls.  Skin: Negative for rash.  Neurological: Negative for headaches.  Psychiatric/Behavioral: Negative for depression.   Past Medical History:  Diagnosis Date  . Anxiety    controlled with meds  . Anxiety   . Arrhythmia   . Atrial fibrillation ([name])    paroxysmal, failed medical therapy with flecainide,  NSVT with tikosyn  . Bruises easily   . CVA (cerebral vascular accident) ([place]) 12/2007  . Depression    medically controlled   . Glaucoma    also with macular holes AE Dr. Thomasene Ripple  . History of loop recorder   . Hypothyroidism   . Low blood pressure    no falls, but if gets up to fast  . RLS (restless legs syndrome)   . Stroke [facility]) 7 years ago   Lasting effects on balance, different personality.  . Thyroid disease   . Tremor   . 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 ATRIAL FIBRILLATION ABLATION  10/18/12   PVI by Dr Rayann Heman  . ATRIAL FIBRILLATION ABLATION N/A 10/18/2012   Procedure: ATRIAL FIBRILLATION ABLATION;  Surgeon: Thompson Grayer, MD;  Location: [facility] CATH LAB;  Service: Cardiovascular;  Laterality: N/A;  . ATRIAL  FIBRILLATION ABLATION N/A 04/18/2019   Procedure: ATRIAL FIBRILLATION ABLATION;  Surgeon: Thompson Grayer, MD;  Location: [employer] CV LAB;  Service: Cardiovascular;  Laterality: N/A;  . EUS N/A 10/19/2019   Procedure: FULL UPPER ENDOSCOPIC ULTRASOUND (EUS) RADIAL;  Surgeon: Holly Bodily, MD;  Location: [facility] ENDOSCOPY;  Service: Gastroenterology;  Laterality: N/A;  . EYE SURGERY     on L/R eye, macular hole   . FRACTURE SURGERY    . implantable loop recorder placement  07/13/2019   MDT Reveal LINQ1 [facility] [id number]) implanted in office by Dr Rayann Heman for afib management post ablation  . OPEN REDUCTION INTERNAL FIXATION (ORIF) DISTAL RADIAL FRACTURE Right 11/15/2014   Procedure: OPEN REDUCTION INTERNAL FIXATION (ORIF) RIGHT DISTAL RADIAL FRACTURE WITH ALLOGRAFT BONE GRAFT;  Surgeon: Roseanne Kaufman, MD;  Location: [place];  Service: Orthopedics;  Laterality: Right;  . PANCREATECTOMY N/A 02/13/2020   Procedure: LAPAROSCOPIC DISTAL PANCREATECTOMY;  Surgeon: Stark Klein, MD;  Location: [place];  Service: General;  Laterality: N/A;  . TEE WITHOUT CARDIOVERSION N/A 10/18/2012   Procedure: TRANSESOPHAGEAL ECHOCARDIOGRAM (TEE);  Surgeon: Lelon Perla, MD;  Location: [facility] ENDOSCOPY;  Service: Cardiovascular;  Laterality: N/A;  Pre-Ablation at 12pm  . TONSILLECTOMY    . VAGINAL HYSTERECTOMY     Family History  Problem Relation Age of Onset  . Multiple sclerosis Father   . Breast cancer Neg Hx  Social History   Socioeconomic History  . Marital status: Widowed    Spouse name: Not on file  . Number of children: 0  . Years of education: Not on file  . Highest education level: Not on file  Occupational History  . Occupation: Retired    Fish farm manager: RETIRED  Tobacco Use  . Smoking status: Never Smoker  . Smokeless tobacco: Never Used  Vaping Use  . Vaping Use: Never used  Substance and Sexual Activity  . Alcohol use: Yes    Alcohol/week: 2.0 standard drinks    Types: 2  Standard drinks or equivalent per week    Comment: regular  . Drug use: No  . Sexual activity: Yes  Other Topics Concern  . Not on file  Social History Narrative   Lives in [place] with her husband.  No children 2 step kids.  Former Psychologist, prison and probation services   Enjoys exercise- water classes, yoga Editor, commissioning.    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      Has 1 cat lives alone no kids but see above    Social Determinants of Health   Financial Resource Strain: Low Risk   . Difficulty of Paying Living Expenses: Not hard at all  Food Insecurity: No Food Insecurity  . Worried About Charity fundraiser in the Last Year: Never true  . Ran Out of Food in the Last Year: Never true  Transportation Needs: No Transportation Needs  . Lack of Transportation (Medical): No  . Lack of Transportation (Non-Medical): No  Physical Activity: Sufficiently Active  . Days of Exercise per Week: 7 days  . Minutes of Exercise per Session: 60 min  Stress: No Stress Concern Present  . Feeling of Stress : Only a little  Social Connections: Unknown  . Frequency of Communication with Friends and Family: Three times a week  . Frequency of Social Gatherings with Friends and Family: Once a week  . Attends Religious Services: Not on file  . Active Member of Clubs or Organizations: Not on file  . Attends Archivist Meetings: Not on file  . Marital Status: Widowed  Intimate Partner Violence: Not At Risk  . Fear of Current or Ex-Partner: No  . Emotionally Abused: No  . Physically Abused: No  . Sexually Abused: No   Current Meds  Medication Sig  . acetaminophen (TYLENOL) 500 MG tablet Take 1,000 mg by mouth every 6 (six) hours as needed (pain).   Marland Kitchen amphetamine-dextroamphetamine (ADDERALL XR) 10 MG 24 hr capsule Take 1 capsule (10 mg total) by mouth daily.  . B Complex-C (B-COMPLEX WITH VITAMIN C)  tablet Take 1 tablet by mouth daily.  . blood glucose meter kit and supplies KIT Dispense based on patient and insurance preference. Use up to four times daily as directed. (FOR ICD-9 250.00, 250.01).  . Calcium Carb-Cholecalciferol (CALCIUM 600+D3 PO) Take 2 tablets by mouth daily.  . Cholecalciferol (VITAMIN D3) 50 MCG (2000 UT) TABS Take 4,000 Units by mouth daily.  Marland Kitchen CRANBERRY EXTRACT PO Take 2 tablets by mouth every evening.   . denosumab (PROLIA) 60 MG/ML SOLN injection Inject 60 mg into the skin every 6 (six) months.   . Dextran POWD by Does not apply route.  . diltiazem (CARDIZEM CD) 240 MG 24 hr capsule TAKE 1 CAPSULE (240 MG  TOTAL) BY MOUTH DAILY. MAY TAKE EXTRA CAPSULE DAILY AS NEEDED FOR AFIB  . DULoxetine (CYMBALTA) 60 MG capsule TAKE 1 CAPSULE (60 MG TOTAL) BY MOUTH 2 (TWO) TIMES DAILY.  Marland Kitchen ELIQUIS 5 MG TABS tablet TAKE 1 TABLET BY MOUTH TWICE A DAY (Patient taking differently: Take 5 mg by mouth in the morning and at bedtime. )  . estradiol (ESTRACE VAGINAL) 0.1 MG/GM vaginal cream Apply 0.6m (pea-sized amount)  just inside the vaginal introitus with a finger-tip on Monday, Wednesday and Friday nights.  . flecainide (TAMBOCOR) 100 MG tablet TAKE 1 TABLET BY MOUTH TWICE A DAY (Patient taking differently: Take 100 mg by mouth in the morning and at bedtime. )  . gabapentin (NEURONTIN) 100 MG capsule Take 2 capsules (200 mg total) by mouth 2 (two) times daily.  .Marland Kitchenlevothyroxine (SYNTHROID) 25 MCG tablet Take 1 tablet (25 mcg total) by mouth daily before breakfast. 30 minutes before food  . LORazepam (ATIVAN) 0.5 MG tablet Take 1 tablet (0.5 mg total) by mouth every 8 (eight) hours.  . Melatonin 10 MG TABS Take 2.5-5 mg by mouth at bedtime as needed (sleep.).  .Marland Kitchenmethocarbamol (ROBAXIN) 500 MG tablet Take 1 tablet (500 mg total) by mouth every 6 (six) hours as needed for muscle spasms.  . Multiple Vitamin (MULTIVITAMIN WITH MINERALS) TABS tablet Take 1 tablet by mouth daily. One-A-Day  .  ondansetron (ZOFRAN-ODT) 4 MG disintegrating tablet Take 1 tablet (4 mg total) by mouth every 6 (six) hours as needed for nausea.  .Marland KitchenoxyCODONE (OXY IR/ROXICODONE) 5 MG immediate release tablet Take 1-2 tablets (5-10 mg total) by mouth every 4 (four) hours as needed for moderate pain, severe pain or breakthrough pain.  . pravastatin (PRAVACHOL) 10 MG tablet Take 1 tablet (10 mg total) by mouth at bedtime. Take a whole pill  . SALT SUBSTITUTES PO Take 1 tablet by mouth daily as needed (hypotension (prevention of syncope episode)).   .Marland Kitchentimolol (TIMOPTIC) 0.5 % ophthalmic solution Place 1 drop into the left eye at bedtime.   . traMADol (ULTRAM) 50 MG tablet Take 1 tablet (50 mg total) by mouth every 6 (six) hours as needed (mild pain).  . vitamin C (ASCORBIC ACID) 500 MG tablet Take 1,000 mg by mouth daily.   . vitamin E 400 UNIT capsule Take 400 Units by mouth daily.    . [DISCONTINUED] levothyroxine (SYNTHROID) 25 MCG tablet Take 1 tablet (25 mcg total) by mouth daily. 30 minutes before food (Patient taking differently: Take 25 mcg by mouth daily before breakfast. 30 minutes before food)  . [DISCONTINUED] nitrofurantoin, macrocrystal-monohydrate, (MACROBID) 100 MG capsule Take 1 capsule (100 mg total) by mouth 2 (two) times daily. With food  . [DISCONTINUED] pravastatin (PRAVACHOL) 20 MG tablet Take 1 tablet (20 mg total) by mouth at bedtime. (Patient taking differently: Take 10 mg by mouth at bedtime. )   Allergies  Allergen Reactions  . Darvon Other (See Comments)    Severe panic attacks  . Propoxyphene Other (See Comments)    GI Upset Severe panic attacks GI Upset  . Propoxyphene N-Acetaminophen Other (See Comments)    Severe panic attacks  . Levofloxacin Anxiety and Other (See Comments)    Causes panic attacks.   Recent Results (from the past 2160 hour(s))  CUP PACEART REMOTE DEVICE CHECK     Status: None   Collection Time: 03/29/20 11:50 PM  Result Value Ref Range   Date Time  Interrogation Session [id number]   Pulse Generator  Manufacturer MERM    Pulse Gen Model [id number] Reveal LINQ    Pulse Gen Serial Number [id number]    Clinic Name [facility]    Implantable Pulse Generator Type ICM/ILR    Implantable Pulse Generator Implant Date [id number]   CUP PACEART REMOTE DEVICE CHECK     Status: None   Collection Time: 05/01/20 11:51 PM  Result Value Ref Range   Date Time Interrogation Session [id number]    Pulse Generator Manufacturer MERM    Pulse Gen Model [id number] Reveal LINQ    Pulse Gen Serial Number [id number]    Clinic Name [facility]    Implantable Pulse Generator Type ICM/ILR    Implantable Pulse Generator Implant Date [id number]    Eval Rhythm SR at 62 bpm   Urine Culture     Status: Abnormal   Collection Time: 05/22/20  2:04 PM   Specimen: Urine  Result Value Ref Range   MICRO NUMBER: [id number]    SPECIMEN QUALITY: Adequate    Sample Source URINE    STATUS: FINAL    ISOLATE 1: Enterococcus faecalis (A)     Comment: Greater than 100,000 CFU/mL of Enterococcus faecalis      Susceptibility   Enterococcus faecalis - URINE CULTURE POSITIVE 1    AMPICILLIN <=2 Sensitive     VANCOMYCIN 1 Sensitive     NITROFURANTOIN* <=16 Sensitive      * Legend:S = Susceptible  I = IntermediateR = Resistant  NS = Not susceptible* = Not tested  NR = Not reported**NN = See antimicrobic comments  Urinalysis, Routine w reflex microscopic     Status: Abnormal   Collection Time: 05/22/20  2:04 PM  Result Value Ref Range   Color, Urine DARK YELLOW YELLOW   APPearance CLEAR CLEAR   Specific Gravity, Urine 1.021 1.001 - 1.03   pH 5.5 5.0 - 8.0   Glucose, UA NEGATIVE NEGATIVE   Bilirubin Urine NEGATIVE NEGATIVE   Ketones, ur NEGATIVE NEGATIVE   Hgb urine dipstick NEGATIVE NEGATIVE   Protein, ur NEGATIVE NEGATIVE   Nitrite NEGATIVE NEGATIVE   Leukocytes,Ua TRACE (A) NEGATIVE   WBC, UA 6-10 (A) 0 - 5 /HPF   RBC / HPF 0-2 0 - 2 /HPF   Squamous Epithelial / LPF 0-5  < OR = 5 /HPF   Bacteria, UA NONE SEEN NONE SEEN /HPF   Hyaline Cast NONE SEEN NONE SEEN /LPF  CUP PACEART REMOTE DEVICE CHECK     Status: None   Collection Time: 06/03/20 11:55 PM  Result Value Ref Range   Date Time Interrogation Session [phone]    Pulse Generator Manufacturer MERM    Pulse Gen Model [id number] Reveal LINQ    Pulse Gen Serial Number [id number]    Clinic [facility]    Implantable Pulse Generator Type ICM/ILR    Implantable Pulse Generator Implant Date [id number]    Eval Rhythm SR at 62 bpm    Objective  Body mass index is 25.99 kg/m. Wt Readings from Last 3 Encounters:  06/06/20 176 lb (79.8 kg)  03/01/20 169 lb 12.8 oz (77 kg)  02/13/20 170 lb (77.1 kg)   Temp Readings from Last 3 Encounters:  06/06/20 98.2 F (36.8 C) (Oral)  03/01/20 98.4 F (36.9 C) (Oral)  02/16/20 98.3 F (36.8 C) (Oral)   BP Readings from Last 3 Encounters:  06/06/20 126/88  03/01/20 106/68  02/17/20 (!) 98/51   Pulse Readings from Last 3 Encounters:  06/06/20 75  03/01/20 92  02/16/20 78    Physical Exam Vitals and nursing note reviewed.  Constitutional:      Appearance: Normal appearance. She is well-developed, well-groomed and overweight.  HENT:     Head: Normocephalic and atraumatic.  Eyes:     Conjunctiva/sclera: Conjunctivae normal.     Pupils: Pupils are equal, round, and reactive to light.  Cardiovascular:     Rate and Rhythm: Normal rate and regular rhythm.     Heart sounds: Normal heart sounds. No murmur heard.   Pulmonary:     Effort: Pulmonary effort is normal.     Breath sounds: Normal breath sounds.  Skin:    General: Skin is warm and dry.       Neurological:     General: No focal deficit present.     Mental Status: She is alert and oriented to person, place, and time. Mental status is at baseline.     Gait: Gait normal.  Psychiatric:        Attention and Perception: Attention and perception normal.        Mood and Affect: Mood and  affect normal.        Speech: Speech normal.        Behavior: Behavior normal. Behavior is cooperative.        Thought Content: Thought content normal.        Cognition and Memory: Cognition and memory normal.        Judgment: Judgment normal.     Assessment  Plan  Urinary tract infection without hematuria, site unspecified Resolved macrobid   Hyperlipidemia, unspecified hyperlipidemia type - Plan: Lipid panel, pravastatin (PRAVACHOL) 10 MG tablet  Skin cancer screening - Plan: Ambulatory referral to Dermatology  Hypothyroidism, unspecified type - Plan: levothyroxine (SYNTHROID) 25 MCG tablet  Cerebrovascular accident (CVA), unspecified mechanism ([place]) - Plan: pravastatin (PRAVACHOL) 10 MG tablet  Nevus right lower back - Plan: Ambulatory referral to Dermatology   HM Flu shot utd  utd prevnar  pna 24 utd Tdap utd  shingrix had 2/2 shots covid vx had 2/2 moderna booster soon as of 06/2020 [facility]  Pap out of age window mammo12/28/20 negativeordered repeat Colonoscopy had 03/06/14 diverticulosis  02/17/16 osteoporosis reordered another  dexa + osteoporosis next prolia shot due 07/02/20 at f/u  Skin [facility] derm pt saw 12/22/19 Aks face x 3 f/u 1 year Dr. Will Bonnet  Echo had 04/11/2019 ef 60-65% She is doing exercise PT at [facility] Dr. Allen Norris  Dental Dr. Sandrea Hughs will establish  Provider: Dr. Olivia Mackie McLean-Scocuzza-Internal Medicine

## 2020-06-10 ENCOUNTER — Ambulatory Visit (INDEPENDENT_AMBULATORY_CARE_PROVIDER_SITE_OTHER): Payer: PPO

## 2020-06-10 DIAGNOSIS — I480 Paroxysmal atrial fibrillation: Secondary | ICD-10-CM | POA: Diagnosis not present

## 2020-06-10 NOTE — Progress Notes (Signed)
Carelink Summary Report / Loop Recorder 

## 2020-06-17 ENCOUNTER — Other Ambulatory Visit: Payer: Self-pay | Admitting: Psychiatry

## 2020-06-17 NOTE — Telephone Encounter (Signed)
It's been a long time since last visit 11/2018?

## 2020-06-18 DIAGNOSIS — R202 Paresthesia of skin: Secondary | ICD-10-CM | POA: Diagnosis not present

## 2020-06-18 DIAGNOSIS — R251 Tremor, unspecified: Secondary | ICD-10-CM | POA: Diagnosis not present

## 2020-06-18 DIAGNOSIS — Z8673 Personal history of transient ischemic attack (TIA), and cerebral infarction without residual deficits: Secondary | ICD-10-CM | POA: Diagnosis not present

## 2020-06-19 ENCOUNTER — Encounter: Payer: Self-pay | Admitting: Internal Medicine

## 2020-06-24 NOTE — Addendum Note (Signed)
Addended by: Orland Mustard on: 06/24/2020 07:06 AM   Modules accepted: Orders

## 2020-06-25 ENCOUNTER — Other Ambulatory Visit: Payer: Self-pay | Admitting: General Surgery

## 2020-06-25 DIAGNOSIS — R140 Abdominal distension (gaseous): Secondary | ICD-10-CM

## 2020-07-02 ENCOUNTER — Ambulatory Visit (INDEPENDENT_AMBULATORY_CARE_PROVIDER_SITE_OTHER): Payer: PPO

## 2020-07-02 ENCOUNTER — Other Ambulatory Visit: Payer: PPO

## 2020-07-02 ENCOUNTER — Other Ambulatory Visit: Payer: Self-pay | Admitting: Internal Medicine

## 2020-07-02 ENCOUNTER — Other Ambulatory Visit: Payer: Self-pay

## 2020-07-02 ENCOUNTER — Ambulatory Visit: Payer: PPO | Admitting: Internal Medicine

## 2020-07-02 DIAGNOSIS — E039 Hypothyroidism, unspecified: Secondary | ICD-10-CM | POA: Diagnosis not present

## 2020-07-02 DIAGNOSIS — M810 Age-related osteoporosis without current pathological fracture: Secondary | ICD-10-CM

## 2020-07-02 DIAGNOSIS — E785 Hyperlipidemia, unspecified: Secondary | ICD-10-CM

## 2020-07-02 LAB — LIPID PANEL
Cholesterol: 205 mg/dL — ABNORMAL HIGH (ref 0–200)
HDL: 96.2 mg/dL (ref >39.00)
LDL Cholesterol: 96 mg/dL (ref 0–99)
NonHDL: 108.31
Total CHOL/HDL Ratio: 2
Triglycerides: 63 mg/dL (ref 0.0–149.0)
VLDL: 12.6 mg/dL (ref 0.0–40.0)

## 2020-07-02 LAB — TSH: TSH: 2.07 u[IU]/mL (ref 0.35–4.50)

## 2020-07-02 MED ORDER — DENOSUMAB 60 MG/ML ~~LOC~~ SOSY
60.0000 mg | PREFILLED_SYRINGE | Freq: Once | SUBCUTANEOUS | Status: AC
  Administered 2020-07-02: 60 mg via SUBCUTANEOUS

## 2020-07-02 NOTE — Addendum Note (Signed)
Addended by: Leeanne Rio on: 07/02/2020 10:58 AM   Modules accepted: Orders

## 2020-07-02 NOTE — Progress Notes (Signed)
Patient presented for 6-month Prolia injection SQ to left arm. Patient tolerated well. 

## 2020-07-03 ENCOUNTER — Other Ambulatory Visit: Payer: Self-pay | Admitting: Internal Medicine

## 2020-07-03 ENCOUNTER — Other Ambulatory Visit (INDEPENDENT_AMBULATORY_CARE_PROVIDER_SITE_OTHER): Payer: PPO

## 2020-07-03 DIAGNOSIS — E785 Hyperlipidemia, unspecified: Secondary | ICD-10-CM

## 2020-07-03 LAB — HEPATIC FUNCTION PANEL
ALT: 23 U/L (ref 0–35)
AST: 29 U/L (ref 0–37)
Albumin: 4.3 g/dL (ref 3.5–5.2)
Alkaline Phosphatase: 59 U/L (ref 39–117)
Bilirubin, Direct: 0.1 mg/dL (ref 0.0–0.3)
Total Bilirubin: 0.7 mg/dL (ref 0.2–1.2)
Total Protein: 7.1 g/dL (ref 6.0–8.3)

## 2020-07-03 NOTE — Progress Notes (Signed)
Faxed add on sheet was sent to the lab:)

## 2020-07-03 NOTE — Telephone Encounter (Signed)
Prescription refill request for Eliquis received. Indication: Afib Last office visit:07/13/2019 Scr: 0.61, 02/17/2020 Age: 74 yo Weight:79.8 kg   Prescription refill sent.

## 2020-07-04 DIAGNOSIS — L821 Other seborrheic keratosis: Secondary | ICD-10-CM | POA: Diagnosis not present

## 2020-07-04 DIAGNOSIS — L570 Actinic keratosis: Secondary | ICD-10-CM | POA: Diagnosis not present

## 2020-07-04 DIAGNOSIS — L578 Other skin changes due to chronic exposure to nonionizing radiation: Secondary | ICD-10-CM | POA: Diagnosis not present

## 2020-07-13 LAB — CUP PACEART REMOTE DEVICE CHECK
Date Time Interrogation Session: 20211204225904
Implantable Pulse Generator Implant Date: 20201210

## 2020-07-15 ENCOUNTER — Other Ambulatory Visit: Payer: Self-pay | Admitting: Psychiatry

## 2020-07-15 ENCOUNTER — Other Ambulatory Visit: Payer: Self-pay | Admitting: Internal Medicine

## 2020-07-15 ENCOUNTER — Ambulatory Visit (INDEPENDENT_AMBULATORY_CARE_PROVIDER_SITE_OTHER): Payer: PPO

## 2020-07-15 ENCOUNTER — Other Ambulatory Visit: Payer: PPO

## 2020-07-15 DIAGNOSIS — I480 Paroxysmal atrial fibrillation: Secondary | ICD-10-CM | POA: Diagnosis not present

## 2020-07-15 DIAGNOSIS — F419 Anxiety disorder, unspecified: Secondary | ICD-10-CM

## 2020-07-15 NOTE — Telephone Encounter (Signed)
Call to RS

## 2020-07-17 NOTE — Telephone Encounter (Signed)
Next visit is 09/09/20

## 2020-07-30 NOTE — Progress Notes (Signed)
Carelink Summary Report / Loop Recorder 

## 2020-08-09 ENCOUNTER — Ambulatory Visit: Payer: PPO

## 2020-08-09 DIAGNOSIS — Z03818 Encounter for observation for suspected exposure to other biological agents ruled out: Secondary | ICD-10-CM | POA: Diagnosis not present

## 2020-08-09 DIAGNOSIS — Z20822 Contact with and (suspected) exposure to covid-19: Secondary | ICD-10-CM | POA: Diagnosis not present

## 2020-08-12 ENCOUNTER — Ambulatory Visit
Admission: RE | Admit: 2020-08-12 | Discharge: 2020-08-12 | Disposition: A | Discharge status: Home / Self Care | Payer: PPO | Source: Ambulatory Visit | Attending: General Surgery | Admitting: General Surgery

## 2020-08-12 ENCOUNTER — Other Ambulatory Visit: Payer: Self-pay

## 2020-08-12 DIAGNOSIS — R140 Abdominal distension (gaseous): Secondary | ICD-10-CM | POA: Diagnosis not present

## 2020-08-12 DIAGNOSIS — M47816 Spondylosis without myelopathy or radiculopathy, lumbar region: Secondary | ICD-10-CM | POA: Diagnosis not present

## 2020-08-12 DIAGNOSIS — K862 Cyst of pancreas: Secondary | ICD-10-CM | POA: Diagnosis not present

## 2020-08-12 DIAGNOSIS — J9811 Atelectasis: Secondary | ICD-10-CM | POA: Diagnosis not present

## 2020-08-12 MED ORDER — IOPAMIDOL (ISOVUE-300) INJECTION 61%
100.0000 mL | Freq: Once | INTRAVENOUS | Status: AC | PRN
  Administered 2020-08-12: 100 mL via INTRAVENOUS

## 2020-08-14 ENCOUNTER — Ambulatory Visit (INDEPENDENT_AMBULATORY_CARE_PROVIDER_SITE_OTHER): Payer: PPO

## 2020-08-14 VITALS — Ht 69.0 in | Wt 176.0 lb

## 2020-08-14 DIAGNOSIS — Z0000 Encounter for general adult medical examination without abnormal findings: Secondary | ICD-10-CM

## 2020-08-14 NOTE — Progress Notes (Signed)
Subjective:   Monica Whitaker is a 75 y.o. female who presents for Medicare Annual (Subsequent) preventive examination.  Review of Systems    No ROS.  Medicare Wellness Virtual Visit.    Cardiac Risk Factors include: advanced age (>9mn, >>24women)     Objective:    Today's Vitals   08/14/20 1248  Weight: 176 lb (79.8 kg)  Height: 5' 9" (1.753 m)   Body mass index is 25.99 kg/m.  Advanced Directives 08/14/2020 02/06/2020 10/19/2019 08/09/2019 12/09/2017 05/07/2017 05/06/2016  Does Patient Have a Medical Advance Directive? _0  Yes Yes  Type of AParamed[place]of [place] will Healthcare Power of [place] will [place]Living will;Out of facility DNR (pink MOST or yellow form) H[place]bertLiving will HLa Loma de FalconLiving will  Does patient want to make changes to medical advance directive? No - Patient declined No - Patient declined - No - Patient declined - - No - Patient declined  Copy of [place] Chart? Yes - validated most recent copy scanned in chart (See row information) No - copy requested - Yes - validated most recent copy scanned in chart (See row information) Yes Yes No - copy requested  Would patient like information on creating a medical advance directive? - - - - - - -    Current Medications (verified) Outpatient Encounter Medications as of 08/14/2020  Medication Sig  . acetaminophen (TYLENOL) 500 MG tablet Take 1,000 mg by mouth every 6 (six) hours as needed (pain).   .Marland Kitchenamphetamine-dextroamphetamine (ADDERALL XR) 10 MG 24 hr capsule Take 1 capsule (10 mg total) by mouth daily.  .Marland Kitchenapixaban (ELIQUIS) 5 MG TABS tablet Take 1 tablet (5 mg total) by mouth in the morning and at bedtime.  . B Complex-C (B-COMPLEX WITH VITAMIN C) tablet Take 1 tablet by mouth daily.  . blood glucose meter kit and supplies  KIT Dispense based on patient and insurance preference. Use up to four times daily as directed. (FOR ICD-9 250.00, 250.01).  . Calcium Carb-Cholecalciferol (CALCIUM 600+D3 PO) Take 2 tablets by mouth daily.  . Cholecalciferol (VITAMIN D3) 50 MCG (2000 UT) TABS Take 4,000 Units by mouth daily.  .Marland KitchenCRANBERRY EXTRACT PO Take 2 tablets by mouth every evening.   . denosumab (PROLIA) 60 MG/ML SOLN injection Inject 60 mg into the skin every 6 (six) months.   . Dextran POWD by Does not apply route.  . diltiazem (CARDIZEM CD) 240 MG 24 hr capsule TAKE 1 CAPSULE (240 MG TOTAL) BY MOUTH DAILY. MAY TAKE EXTRA CAPSULE DAILY AS NEEDED FOR AFIB  . DULoxetine (CYMBALTA) 60 MG capsule TAKE 1 CAPSULE (60 MG TOTAL) BY MOUTH 2 (TWO) TIMES DAILY.  .Marland Kitchenestradiol (ESTRACE VAGINAL) 0.1 MG/GM vaginal cream Apply 0.536m(pea-sized amount)  just inside the vaginal introitus with a finger-tip on Monday, Wednesday and Friday nights.  . flecainide (TAMBOCOR) 100 MG tablet TAKE 1 TABLET (100 MG TOTAL) BY MOUTH IN THE MORNING AND AT BEDTIME.  . Marland Kitchenabapentin (NEURONTIN) 100 MG capsule Take 2 capsules (200 mg total) by mouth 2 (two) times daily.  . Marland Kitchenevothyroxine (SYNTHROID) 25 MCG tablet Take 1 tablet (25 mcg total) by mouth daily before breakfast. 30 minutes before food  . LORazepam (ATIVAN) 0.5 MG tablet TAKE 1 TABLET (0.5 MG TOTAL) BY MOUTH EVERY 8 (EIGHT) HOURS.  . Melatonin 10 MG TABS Take 2.5-5 mg by mouth  at bedtime as needed (sleep.).  . methocarbamol (ROBAXIN) 500 MG tablet Take 1 tablet (500 mg total) by mouth every 6 (six) hours as needed for muscle spasms.  . Multiple Vitamin (MULTIVITAMIN WITH MINERALS) TABS tablet Take 1 tablet by mouth daily. One-A-Day  . ondansetron (ZOFRAN-ODT) 4 MG disintegrating tablet Take 1 tablet (4 mg total) by mouth every 6 (six) hours as needed for nausea.  . oxyCODONE (OXY IR/ROXICODONE) 5 MG immediate release tablet Take 1-2 tablets (5-10 mg total) by mouth every 4 (four) hours as needed for  moderate pain, severe pain or breakthrough pain.  . pravastatin (PRAVACHOL) 10 MG tablet Take 1 tablet (10 mg total) by mouth at bedtime. Take a whole pill  . SALT SUBSTITUTES PO Take 1 tablet by mouth daily as needed (hypotension (prevention of syncope episode)).   . timolol (TIMOPTIC) 0.5 % ophthalmic solution Place 1 drop into the left eye at bedtime.   . traMADol (ULTRAM) 50 MG tablet Take 1 tablet (50 mg total) by mouth every 6 (six) hours as needed (mild pain).  . vitamin C (ASCORBIC ACID) 500 MG tablet Take 1,000 mg by mouth daily.   . vitamin E 400 UNIT capsule Take 400 Units by mouth daily.     No facility-administered encounter medications on file as of 08/14/2020.    Allergies (verified) Darvon, Propoxyphene, Propoxyphene n-acetaminophen, and Levofloxacin   History: Past Medical History:  Diagnosis Date  . Actinic keratosis 12/20/2012   left nasal tip  . Anxiety    controlled with meds  . Anxiety   . Arrhythmia   . Atrial fibrillation ([name])    paroxysmal, failed medical therapy with flecainide,  NSVT with tikosyn  . Basal cell carcinoma 12/20/2012   left proximal mandible  . Bruises easily   . CVA (cerebral vascular accident) ([name]) 12/2007  . Depression    medically controlled   . Glaucoma    also with macular holes AE Dr. Dingledein  . History of loop recorder   . Hypothyroidism   . Low blood pressure    no falls, but if gets up to fast  . RLS (restless legs syndrome)   . Stroke ([name]) 7 years ago   Lasting effects on balance, different personality.  . Thyroid disease   . Tremor   . Ventricular tachycardia ([name])    pt told at [facility] that she had RVOT VT   Past Surgical History:  Procedure Laterality Date  . ATRIAL FIBRILLATION ABLATION  10/18/12   PVI by Dr Allred  . ATRIAL FIBRILLATION ABLATION N/A 10/18/2012   Procedure: ATRIAL FIBRILLATION ABLATION;  Surgeon: James Allred, MD;  Location: [facility] CATH [facility];  Service: Cardiovascular;  Laterality: N/A;  . ATRIAL  FIBRILLATION ABLATION N/A 04/18/2019   Procedure: ATRIAL FIBRILLATION ABLATION;  Surgeon: Allred, James, MD;  Location: [facility] INVASIVE CV LAB;  Service: Cardiovascular;  Laterality: N/A;  . EUS N/A 10/19/2019   Procedure: FULL UPPER ENDOSCOPIC ULTRASOUND (EUS) RADIAL;  Surgeon: Burbridge, Rebecca A, MD;  Location: [facility] ENDOSCOPY;  Service: Gastroenterology;  Laterality: N/A;  . EYE SURGERY     on L/R eye, macular hole   . FRACTURE SURGERY    . implantable loop recorder placement  07/13/2019   MDT Reveal LINQ1 (SN [id number]) implanted in office by Dr Allred for afib management post ablation  . OPEN REDUCTION INTERNAL FIXATION (ORIF) DISTAL RADIAL FRACTURE Right 11/15/2014   Procedure: OPEN REDUCTION INTERNAL FIXATION (ORIF) RIGHT DISTAL RADIAL FRACTURE WITH ALLOGRAFT BONE GRAFT;  Surgeon: William   Amedeo Plenty, MD;  Location: [facility];  Service: Orthopedics;  Laterality: Right;  . PANCREATECTOMY N/A 02/13/2020   Procedure: LAPAROSCOPIC DISTAL PANCREATECTOMY;  Surgeon: Stark Klein, MD;  Location: [place];  Service: General;  Laterality: N/A;  . TEE WITHOUT CARDIOVERSION N/A 10/18/2012   Procedure: TRANSESOPHAGEAL ECHOCARDIOGRAM (TEE);  Surgeon: Lelon Perla, MD;  Location: [facility] ENDOSCOPY;  Service: Cardiovascular;  Laterality: N/A;  Pre-Ablation at 12pm  . TONSILLECTOMY    . VAGINAL HYSTERECTOMY     Family History  Problem Relation Age of Onset  . Multiple sclerosis Father   . Breast cancer Neg Hx    Social History   Socioeconomic History  . Marital status: Widowed    Spouse name: Not on file  . Number of children: 0  . Years of education: Not on file  . Highest education level: Not on file  Occupational History  . Occupation: Retired    Fish farm manager: RETIRED  Tobacco Use  . Smoking status: Never Smoker  . Smokeless tobacco: Never Used  Vaping Use  . Vaping Use: Never used  Substance and Sexual Activity  . Alcohol use: Yes    Alcohol/week: 2.0 standard drinks    Types: 2  Standard drinks or equivalent per week    Comment: regular  . Drug use: No  . Sexual activity: Yes  Other Topics Concern  . Not on file  Social History Narrative   [place]ives [place].  No children 2 step kids.  Former Psychologist, prison and probation services   Enjoys exercise- water classes, yoga Editor, commissioning.    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      Has 1 cat lives alone no kids but see above    Social Determinants of Health   Financial Resource Strain: Not on file  Food Insecurity: Not on file  Transportation Needs: Not on file  Physical Activity: Not on file  Stress: Not on file  Social Connections: Not on file    Tobacco Counseling Counseling given: Not Answered   Clinical Intake:  Pre-visit preparation completed: Yes        Diabetes: No  How often do you need to have someone help you when you read instructions, pamphlets, or other written materials from your doctor or pharmacy?: 1 - Never    Interpreter Needed?: No      Activities of Daily Living In your present state of health, do you have any difficulty performing the following activities: 08/14/2020 02/06/2020  Hearing? N N  Vision? N N  Difficulty concentrating or making decisions? N N  Walking or climbing stairs? N N  Dressing or bathing? N N  Doing errands, shopping? N N  Preparing Food and eating ? N -  Using the Toilet? N -  In the past six months, have you accidently leaked urine? N -  Do you have problems with loss of bowel control? N -  Managing your Medications? N -  Managing your Finances? N -  Housekeeping or managing your Housekeeping? N -  Some recent data might be hidden    Patient Care Team: McLean-Scocuzza, Nino Glow, MD as PCP - General (Internal Medicine) Sherran Needs, NP as Nurse Practitioner (Nurse Practitioner) Thompson Grayer, MD as Consulting Physician  (Cardiology) Roseanne Kaufman, MD as Consulting Physician (Orthopedic Surgery) Josefine Class,  MD as Referring Physician (Gastroenterology) Dingeldein, Steven, MD as Consulting Physician (Ophthalmology)  Indicate any recent Medical Services you may have received from other than [facility] providers in the past year (date may be approximate).     Assessment:   This is a routine wellness examination for Monica Whitaker.  I connected with Monica Whitaker today by telephone and verified that I am speaking with the correct person using two identifiers. Location patient: home Location provider: work Persons participating in the virtual visit: patient, nurse.    I discussed the limitations, risks, security and privacy concerns of performing an evaluation and management service by telephone and the availability of in person appointments. The patient expressed understanding and verbally consented to this telephonic visit.    Interactive audio and video telecommunications were attempted between this provider and patient, however failed, due to patient having technical difficulties OR patient did not have access to video capability.  We continued and completed visit with audio only.  Some vital signs may be absent or patient reported.   Hearing/Vision screen  Hearing Screening   125Hz 250Hz 500Hz 1000Hz 2000Hz 3000Hz 4000Hz 6000Hz 8000Hz  Right ear:           Left ear:           Comments: Patient is able to hear conversational tones without difficulty.  No issues reported.  Vision Screening Comments: Wears corrective lenses  Visual acuity not assessed, virtual visit.  Dietary issues and exercise activities discussed: Current Exercise Habits: Home exercise routine, Type of exercise: walking;yoga, Time (Minutes): 45, Frequency (Times/Week): 5, Weekly Exercise (Minutes/Week): 225, Intensity: Mild  Healthy diet Good water intake   Goals      Patient Stated   .  Weight goal 160lb (pt-stated)       Depression Screen PHQ 2/9 Scores 08/14/2020 06/06/2020 02/02/2020 12/15/2019 09/22/2019 08/09/2019 06/16/2019  PHQ - 2 Score 1 0 0 0 1 - 2  PHQ- 9 Score - 0 0 - - - -  Exception Documentation (No Data) - - - - Other- indicate reason in comment box -    Fall Risk Fall Risk  08/14/2020 06/06/2020 03/01/2020 02/02/2020 12/15/2019  Falls in the past year? 0 0 1 0 0  Comment - - - - -  Number falls in past yr: 0 0 0 0 -  Injury with Fall? 0 0 0 0 -  Comment - - - - -  Risk for fall due to : - - - - -  Follow up Falls evaluation completed Falls evaluation completed Falls evaluation completed Education provided -    FALL RISK PREVENTION PERTAINING TO THE HOME: Handrails in use when climbing stairs? [place] Home free of loose throw rugs in walkways, pet beds, electrical cords, etc? Yes  Adequate lighting in your home to reduce risk of falls? Yes   ASSISTIVE DEVICES UTILIZED TO PREVENT FALLS: Use of a cane, walker or w/c? No   TIMED UP AND GO: Was the test performed? No .   Cognitive Function: Patient is alert and oriented x3.  Denies difficulty focusing, making decisions, memory loss.  MMSE/6CIT deferred. Normal by direct communication/observation.  MMSE - Mini Mental State Exam 05/07/2017 05/06/2016  Orientation to time 5 5  Orientation to Place 5 5  Registration 3 3  Attention/ Calculation 0 0  Recall 3 2  Recall-comments - pt was unable to recall 1 of 3 words  Language- name 2 objects 0 0  Language- repeat 1 1  Language- follow 3 step   command 3 3  Language- read & follow direction 0 0  Write a sentence 0 0  Copy design 0 0  Total score 20 19     6CIT Screen 08/09/2019  What Year? 0 points  What month? 0 points  What time? 0 points  Count back from 20 0 points  Months in reverse 0 points  Repeat phrase 0 points  Total Score 0    Immunizations Immunization History  Administered Date(s) Administered  . Fluad Quad(high Dose 65+) 05/10/2019, 05/22/2020  . Influenza Split 05/21/2011,  05/31/2012  . Influenza Whole 06/02/2010  . Influenza, High Dose Seasonal PF 05/24/2014  . Influenza,inj,Quad PF,6+ Mos 04/04/2015, 05/06/2016, 05/13/2017, 05/10/2018  . Moderna Sars-Covid-2 Vaccination 08/17/2019, 09/14/2019, 06/18/2020  . Pneumococcal Conjugate-13 10/02/2013  . Pneumococcal Polysaccharide-23 04/04/2015  . Td 04/18/2008  . Tdap 10/30/2015  . Zoster 04/18/2008  . Zoster Recombinat (Shingrix) 01/27/2018, 03/13/2019   Health Maintenance Health Maintenance  Topic Date Due  . COVID-19 Vaccine (4 - Booster for Moderna series) 12/16/2020  . MAMMOGRAM  07/30/2021  . COLONOSCOPY (Pts 45-49yrs Insurance coverage will need to be confirmed)  03/06/2024  . TETANUS/TDAP  10/29/2025  . INFLUENZA VACCINE  Completed  . DEXA SCAN  Completed  . Hepatitis C Screening  Completed  . PNA vac Low Risk Adult  Completed   Colorectal cancer screening: Type of screening: Colonoscopy. Completed 03/06/14. Repeat every 10 years   Mammogram status: Completed 07/31/19. Repeat every year. Next scheduled 08/16/20. MM 3D SCREEN BREAST BILATERAL   Bone Density status: Completed 07/31/19. Results reflect: Bone density results: OSTEOPOROSIS. Repeat every 2 years. denosumab (PROLIA) 60 MG/ML SOLN injection. Cholecalciferol (VITAMIN D3) 50 MCG (2000 UT) TABS. Calcium Carb-Cholecalciferol (CALCIUM 600+D3 PO).  Lung Cancer Screening: (Low Dose CT Chest recommended if Age 55-80 years, 30 pack-year currently smoking OR have quit w/in 15years.) does not qualify.   Hepatitis C Screening: Completed 08/24/11.  Vision Screening: Recommended annual ophthalmology exams for early detection of glaucoma and other disorders of the eye. Is the patient up to date with their annual eye exam?  Yes  Who is the provider or what is the name of the office in which the patient attends annual eye exams? [facility].   Dental Screening: Recommended annual dental exams for proper oral hygiene.  Community Resource Referral  / Chronic Care Management: CRR required this visit?  No   CCM required this visit?  No      Plan:   Keep all routine maintenance appointments.   Follow up 12/04/20 @ 1:30.  I have personally reviewed and noted the following in the patient's chart:   . Medical and social history . Use of alcohol, tobacco or illicit drugs  . Current medications and supplements . Functional ability and status . Nutritional status . Physical activity . Advanced directives . List of other physicians . Hospitalizations, surgeries, and ER visits in previous 12 months . Vitals . Screenings to include cognitive, depression, and falls . Referrals and appointments  In addition, I have reviewed and discussed with patient certain preventive protocols, quality metrics, and best practice recommendations. A written personalized care plan for preventive services as well as general preventive health recommendations were provided to patient via mychart.     OBrien-Blaney,  L, LPN   08/14/2020        

## 2020-08-14 NOTE — Patient Instructions (Addendum)
Monica Whitaker , Thank you for taking time to come for your Medicare Wellness Visit. I appreciate your ongoing commitment to your health goals. Please review the following plan we discussed and let me know if I can assist you in the future.   These are the goals we discussed: Goals      Patient Stated   .  Weight goal 160lb (pt-stated)       This is a list of the screening recommended for you and due dates:  Health Maintenance  Topic Date Due  . COVID-19 Vaccine (4 - Booster for Moderna series) 12/16/2020  . Mammogram  07/30/2021  . Colon Cancer Screening  03/06/2024  . Tetanus Vaccine  10/29/2025  . Flu Shot  Completed  . DEXA scan (bone density measurement)  Completed  .  Hepatitis C: One time screening is recommended by Center for Disease Control  (CDC) for  adults born from [birth year] through 1964.   Completed  . Pneumonia vaccines  Completed    Immunizations Immunization History  Administered Date(s) Administered  . Fluad Quad(high Dose 65+) 05/10/2019, 05/22/2020  . Influenza Split 05/21/2011, 05/31/2012  . Influenza Whole 06/02/2010  . Influenza, High Dose Seasonal PF 05/24/2014  . Influenza,inj,Quad PF,6+ Mos 04/04/2015, 05/06/2016, 05/13/2017, 05/10/2018  . Moderna Sars-Covid-2 Vaccination 08/17/2019, 09/14/2019, 06/18/2020  . Pneumococcal Conjugate-13 10/02/2013  . Pneumococcal Polysaccharide-23 04/04/2015  . Td 04/18/2008  . Tdap 10/30/2015  . Zoster 04/18/2008  . Zoster Recombinat (Shingrix) 01/27/2018, 03/13/2019   Keep all routine maintenance appointments.   Follow up 12/04/20 @ 1:30.  Advanced directives: on file  Conditions/risks identified: none new  Follow up in one year for your annual wellness visit.   Preventive Care 70 Years and Older, Female Preventive care refers to lifestyle choices and visits with your health care provider that can promote health and wellness. What does preventive care include?  A yearly physical exam. This is also called an annual  well check.  Dental exams once or twice a year.  Routine eye exams. Ask your health care provider how often you should have your eyes checked.  Personal lifestyle choices, including:  Daily care of your teeth and gums.  Regular physical activity.  Eating a healthy diet.  Avoiding tobacco and drug use.  Limiting alcohol use.  Practicing safe sex.  Taking low-dose aspirin every day.  Taking vitamin and mineral supplements as recommended by your health care provider. What happens during an annual well check? The services and screenings done by your health care provider during your annual well check will depend on your age, overall health, lifestyle risk factors, and family history of disease. Counseling  Your health care provider may ask you questions about your:  Alcohol use.  Tobacco use.  Drug use.  Emotional well-being.  Home and relationship well-being.  Sexual activity.  Eating habits.  History of falls.  Memory and ability to understand (cognition).  Work and work Statistician.  Reproductive health. Screening  You may have the following tests or measurements:  Height, weight, and BMI.  Blood pressure.  Lipid and cholesterol levels. These may be checked every 5 years, or more frequently if you are over 66 years old.  Skin check.  Lung cancer screening. You may have this screening every year starting at age 32 if you have a 30-pack-year history of smoking and currently smoke or have quit within the past 15 years.  Fecal occult blood test (FOBT) of the stool. You may have this test every year  starting at age 7.  Flexible sigmoidoscopy or colonoscopy. You may have a sigmoidoscopy every 5 years or a colonoscopy every 10 years starting at age 108.  Hepatitis C blood test.  Hepatitis B blood test.  Sexually transmitted disease (STD) testing.  Diabetes screening. This is done by checking your blood sugar (glucose) after you have not eaten for a while  (fasting). You may have this done every 1-3 years.  Bone density scan. This is done to screen for osteoporosis. You may have this done starting at age 54.  Mammogram. This may be done every 1-2 years. Talk to your health care provider about how often you should have regular mammograms. Talk with your health care provider about your test results, treatment options, and if necessary, the need for more tests. Vaccines  Your health care provider may recommend certain vaccines, such as:  Influenza vaccine. This is recommended every year.  Tetanus, diphtheria, and acellular pertussis (Tdap, Td) vaccine. You may need a Td booster every 10 years.  Zoster vaccine. You may need this after age 81.  Pneumococcal 13-valent conjugate (PCV13) vaccine. One dose is recommended after age 70.  Pneumococcal polysaccharide (PPSV23) vaccine. One dose is recommended after age 29. Talk to your health care provider about which screenings and vaccines you need and how often you need them. This information is not intended to replace advice given to you by your health care provider. Make sure you discuss any questions you have with your health care provider. Document Released: 08/16/2015 Document Revised: 04/08/2016 Document Reviewed: 05/21/2015 Elsevier Interactive Patient Education  2017 [place] Prevention in the Home Falls can cause injuries. They can happen to people of all ages. There are many things you can do to make your home safe and to help prevent falls. What can I do on the outside of my home?  Regularly fix the edges of walkways and driveways and fix any cracks.  Remove anything that might make you trip as you walk through a door, such as a raised step or threshold.  Trim any bushes or trees on the path to your home.  Use bright outdoor lighting.  Clear any walking paths of anything that might make someone trip, such as rocks or tools.  Regularly check to see if handrails are loose  or broken. Make sure that both sides of any steps have handrails.  Any raised decks and porches should have guardrails on the edges.  Have any leaves, snow, or ice cleared regularly.  Use sand or salt on walking paths during winter.  Clean up any spills in your garage right away. This includes oil or grease spills. What can I do in the bathroom?  Use night lights.  Install grab bars by the toilet and in the tub and shower. Do not use towel bars as grab bars.  Use non-skid mats or decals in the tub or shower.  If you need to sit down in the shower, use a plastic, non-slip stool.  Keep the floor dry. Clean up any water that spills on the floor as soon as it happens.  Remove soap buildup in the tub or shower regularly.  Attach bath mats securely with double-sided non-slip rug tape.  Do not have throw rugs and other things on the floor that can make you trip. What can I do in the bedroom?  Use night lights.  Make sure that you have a light by your bed that is easy to reach.  Do not  use any sheets or blankets that are too big for your bed. They should not hang down onto the floor.  Have a firm chair that has side arms. You can use this for support while you get dressed.  Do not have throw rugs and other things on the floor that can make you trip. What can I do in the kitchen?  Clean up any spills right away.  Avoid walking on wet floors.  Keep items that you use a lot in easy-to-reach places.  If you need to reach something above you, use a strong step stool that has a grab bar.  Keep electrical cords out of the way.  Do not use floor polish or wax that makes floors slippery. If you must use wax, use non-skid floor wax.  Do not have throw rugs and other things on the floor that can make you trip. What can I do with my stairs?  Do not leave any items on the stairs.  Make sure that there are handrails on both sides of the stairs and use them. Fix handrails that are  broken or loose. Make sure that handrails are as long as the stairways.  Check any carpeting to make sure that it is firmly attached to the stairs. Fix any carpet that is loose or worn.  Avoid having throw rugs at the top or bottom of the stairs. If you do have throw rugs, attach them to the floor with carpet tape.  Make sure that you have a light switch at the top of the stairs and the bottom of the stairs. If you do not have them, ask someone to add them for you. What else can I do to help prevent falls?  Wear shoes that:  Do not have high heels.  Have rubber bottoms.  Are comfortable and fit you well.  Are closed at the toe. Do not wear sandals.  If you use a stepladder:  Make sure that it is fully opened. Do not climb a closed stepladder.  Make sure that both sides of the stepladder are locked into place.  Ask someone to hold it for you, if possible.  Clearly mark and make sure that you can see:  Any grab bars or handrails.  First and last steps.  Where the edge of each step is.  Use tools that help you move around (mobility aids) if they are needed. These include:  Canes.  Walkers.  Scooters.  Crutches.  Turn on the lights when you go into a dark area. Replace any light bulbs as soon as they burn out.  Set up your furniture so you have a clear path. Avoid moving your furniture around.  If any of your floors are uneven, fix them.  If there are any pets around you, be aware of where they are.  Review your medicines with your doctor. Some medicines can make you feel dizzy. This can increase your chance of falling. Ask your doctor what other things that you can do to help prevent falls. This information is not intended to replace advice given to you by your health care provider. Make sure you discuss any questions you have with your health care provider. Document Released: 05/16/2009 Document Revised: 12/26/2015 Document Reviewed: 08/24/2014 Elsevier  Interactive Patient Education  2017 [employer].

## 2020-08-16 ENCOUNTER — Ambulatory Visit
Admission: RE | Admit: 2020-08-16 | Discharge: 2020-08-16 | Disposition: A | Discharge status: Home / Self Care | Payer: PPO | Source: Ambulatory Visit | Attending: Internal Medicine | Admitting: Internal Medicine

## 2020-08-16 ENCOUNTER — Other Ambulatory Visit: Payer: Self-pay

## 2020-08-16 DIAGNOSIS — Z1231 Encounter for screening mammogram for malignant neoplasm of breast: Secondary | ICD-10-CM | POA: Insufficient documentation

## 2020-08-19 ENCOUNTER — Ambulatory Visit (INDEPENDENT_AMBULATORY_CARE_PROVIDER_SITE_OTHER): Payer: PPO

## 2020-08-19 DIAGNOSIS — I4819 Other persistent atrial fibrillation: Secondary | ICD-10-CM | POA: Diagnosis not present

## 2020-08-20 LAB — CUP PACEART REMOTE DEVICE CHECK
Date Time Interrogation Session: 20220115233903
Implantable Pulse Generator Implant Date: 20201210

## 2020-08-21 ENCOUNTER — Other Ambulatory Visit: Payer: Self-pay | Admitting: Psychiatry

## 2020-08-21 NOTE — Telephone Encounter (Signed)
Next apt 09/09/20

## 2020-08-22 ENCOUNTER — Other Ambulatory Visit: Payer: Self-pay | Admitting: Psychiatry

## 2020-08-29 ENCOUNTER — Ambulatory Visit: Payer: PPO | Admitting: Dermatology

## 2020-09-02 NOTE — Progress Notes (Signed)
Carelink Summary Report / Loop Recorder 

## 2020-09-09 ENCOUNTER — Other Ambulatory Visit: Payer: Self-pay

## 2020-09-09 ENCOUNTER — Ambulatory Visit (INDEPENDENT_AMBULATORY_CARE_PROVIDER_SITE_OTHER): Payer: PPO | Admitting: Psychiatry

## 2020-09-09 ENCOUNTER — Encounter: Payer: Self-pay | Admitting: Psychiatry

## 2020-09-09 DIAGNOSIS — D490 Neoplasm of unspecified behavior of digestive system: Secondary | ICD-10-CM | POA: Diagnosis not present

## 2020-09-09 DIAGNOSIS — F419 Anxiety disorder, unspecified: Secondary | ICD-10-CM | POA: Diagnosis not present

## 2020-09-09 DIAGNOSIS — F331 Major depressive disorder, recurrent, moderate: Secondary | ICD-10-CM | POA: Diagnosis not present

## 2020-09-09 DIAGNOSIS — F908 Attention-deficit hyperactivity disorder, other type: Secondary | ICD-10-CM

## 2020-09-09 DIAGNOSIS — F411 Generalized anxiety disorder: Secondary | ICD-10-CM | POA: Diagnosis not present

## 2020-09-09 MED ORDER — DULOXETINE HCL 60 MG PO CPEP
60.0000 mg | ORAL_CAPSULE | Freq: Two times a day (BID) | ORAL | 1 refills | Status: DC
Start: 2020-09-09 — End: 2021-03-06

## 2020-09-09 MED ORDER — AMPHETAMINE-DEXTROAMPHET ER 10 MG PO CP24: 10.0000 mg | ORAL_CAPSULE | Freq: Every day | ORAL | 0 refills | Status: DC

## 2020-09-09 NOTE — Progress Notes (Signed)
Monica Whitaker [id number] 06/24/1946 75 y.o.  Subjective:   Patient ID:  Monica Whitaker is a 75 y.o. (DOB [birth year]) female.  Chief Complaint:  Chief Complaint  Patient presents with  . Follow-up  . Major depressive disorder, recurrent episode, moderate ([place])    Monica Whitaker presents today for follow-up of worsening depression and anxiety over care of dying and now deceased husband.    seen 14-Jul-2018.  No med changes were made at that visit.  Counseling helped a lot and she feels much better at this time.  Much more functional and productive.  More outgoing.  11/2018 appt noted following without med changes: After 5 months depression lifted after [name] died.  No deaths from Covid.   Mood is a lot better.  Sleep is OK.  2 one mile walks daily.  Had party this weekend.    09/09/2020 appointment with following noted: Patient has had a lot of serious medical problems accounting for the long delay since the last appointment.  Hx pancreatic CA by her report.  Afib is better. No Covid.  Travelled.   Mentally pretty decent.  Hard stuck at [facility] for 2 years.  Went to visit friend in [place] and felt much better and so will travel more.  Lonesome bc no good friends. Can't move bc feels tied to [facility] financially. Feels better and cognitively better with stimulant.  Patient reports stable mood and denies depressed or irritable moods.  Patient denies any recent difficulty with anxiety.  Patient denies difficulty with sleep initiation or maintenance. Denies appetite disturbance.  Patient reports that energy and motivation have been good.  Patient denies any difficulty with concentration.  Patient denies any suicidal ideation.  Hospice counselor Earnest Bailey was excellent and very helpful.  Past Psychiatric Medication Trials: Pramipexole Duloxetine 120 Adderall or dexedrine.  Review of Systems:  Review of Systems  Neurological: Negative for tremors and weakness.   Psychiatric/Behavioral: Negative for agitation, behavioral problems, confusion, decreased concentration, dysphoric mood, hallucinations, self-injury, sleep disturbance and suicidal ideas. The patient is not nervous/anxious and is not hyperactive.     Medications: I have reviewed the patient's current medications.  Current Outpatient Medications  Medication Sig Dispense Refill  . acetaminophen (TYLENOL) 500 MG tablet Take 1,000 mg by mouth every 6 (six) hours as needed (pain).     Marland Kitchen apixaban (ELIQUIS) 5 MG TABS tablet Take 1 tablet (5 mg total) by mouth in the morning and at bedtime. 60 tablet 5  . B Complex-C (B-COMPLEX WITH VITAMIN C) tablet Take 1 tablet by mouth daily.    . Calcium Carb-Cholecalciferol (CALCIUM 600+D3 PO) Take 2 tablets by mouth daily.    . Cholecalciferol (VITAMIN D3) 50 MCG (2000 UT) TABS Take 4,000 Units by mouth daily.    Marland Kitchen CRANBERRY EXTRACT PO Take 2 tablets by mouth every evening.     . denosumab (PROLIA) 60 MG/ML SOLN injection Inject 60 mg into the skin every 6 (six) months.     . Dextran POWD by Does not apply route.    . diltiazem (CARDIZEM CD) 240 MG 24 hr capsule TAKE 1 CAPSULE (240 MG TOTAL) BY MOUTH DAILY. MAY TAKE EXTRA CAPSULE DAILY AS NEEDED FOR AFIB 95 capsule 3  . flecainide (TAMBOCOR) 100 MG tablet TAKE 1 TABLET (100 MG TOTAL) BY MOUTH IN THE MORNING AND AT BEDTIME. 60 tablet 3  . gabapentin (NEURONTIN) 100 MG capsule Take 2 capsules (200 mg total) by mouth 2 (two) times daily.    Marland Kitchen  levothyroxine (SYNTHROID) 25 MCG tablet Take 1 tablet (25 mcg total) by mouth daily before breakfast. 30 minutes before food 90 tablet 3  . LORazepam (ATIVAN) 0.5 MG tablet TAKE 1 TABLET (0.5 MG TOTAL) BY MOUTH EVERY 8 (EIGHT) HOURS. 90 tablet 0  . Melatonin 10 MG TABS Take 2.5-5 mg by mouth at bedtime as needed (sleep.).    Marland Kitchen Multiple Vitamin (MULTIVITAMIN WITH MINERALS) TABS tablet Take 1 tablet by mouth daily. One-A-Day    . oxyCODONE (OXY IR/ROXICODONE) 5 MG immediate  release tablet Take 1-2 tablets (5-10 mg total) by mouth every 4 (four) hours as needed for moderate pain, severe pain or breakthrough pain. 20 tablet 0  . pravastatin (PRAVACHOL) 10 MG tablet Take 1 tablet (10 mg total) by mouth at bedtime. Take a whole pill 90 tablet 3  . SALT SUBSTITUTES PO Take 1 tablet by mouth daily as needed (hypotension (prevention of syncope episode)).     Marland Kitchen timolol (TIMOPTIC) 0.5 % ophthalmic solution Place 1 drop into the left eye at bedtime.     . vitamin C (ASCORBIC ACID) 500 MG tablet Take 1,000 mg by mouth daily.     . vitamin E 400 UNIT capsule Take 400 Units by mouth daily.      Marland Kitchen amphetamine-dextroamphetamine (ADDERALL XR) 10 MG 24 hr capsule Take 1 capsule (10 mg total) by mouth daily. 90 capsule 0  . blood glucose meter kit and supplies KIT Dispense based on patient and insurance preference. Use up to four times daily as directed. (FOR ICD-9 250.00, 250.01). (Patient not taking: Reported on 09/09/2020) 1 each 0  . DULoxetine (CYMBALTA) 60 MG capsule Take 1 capsule (60 mg total) by mouth 2 (two) times daily. 180 capsule 1  . estradiol (ESTRACE VAGINAL) 0.1 MG/GM vaginal cream Apply 0.$[id number]'5mg'[id number]$  (pea-sized amount)  just inside the vaginal introitus with a finger-tip on Monday, Wednesday and Friday nights. (Patient not taking: Reported on 09/09/2020) 30 g 12  . methocarbamol (ROBAXIN) 500 MG tablet Take 1 tablet (500 mg total) by mouth every 6 (six) hours as needed for muscle spasms. (Patient not taking: Reported on 09/09/2020) 30 tablet 2  . ondansetron (ZOFRAN-ODT) 4 MG disintegrating tablet Take 1 tablet (4 mg total) by mouth every 6 (six) hours as needed for nausea. (Patient not taking: Reported on 09/09/2020) 20 tablet 0  . traMADol (ULTRAM) 50 MG tablet Take 1 tablet (50 mg total) by mouth every 6 (six) hours as needed (mild pain). (Patient not taking: Reported on 09/09/2020) 30 tablet 0   No current facility-administered medications for this visit.    Medication Side Effects:  None  Allergies:  Allergies  Allergen Reactions  . Darvon Other (See Comments)    Severe panic attacks  . Propoxyphene Other (See Comments)    GI Upset Severe panic attacks GI Upset  . Propoxyphene N-Acetaminophen Other (See Comments)    Severe panic attacks  . Levofloxacin Anxiety and Other (See Comments)    Causes panic attacks.    Past Medical History:  Diagnosis Date  . Actinic keratosis 12/20/2012   left nasal tip  . Anxiety    controlled with meds  . Anxiety   . Arrhythmia   . Atrial fibrillation ([name])    paroxysmal, failed medical therapy with flecainide,  NSVT with tikosyn  . Basal cell carcinoma 12/20/2012   left proximal mandible  . Bruises easily   . CVA (cerebral vascular accident) ([place]) 12/2007  . Depression    medically controlled   .  Glaucoma    also with macular holes AE Dr. Thomasene Ripple  . History of loop recorder   . Hypothyroidism   . Low blood pressure    no falls, but if gets up to fast  . RLS (restless legs syndrome)   . Stroke [facility]) 7 years ago   Lasting effects on balance, different personality.  . Thyroid disease   . Tremor   . 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 Multiple sclerosis Father   . Breast cancer Neg Hx     Social History   Socioeconomic History  . Marital status: Widowed    Spouse name: Not on file  . Number of children: 0  . Years of education: Not on file  . Highest education level: Not on file  Occupational History  . Occupation: Retired    Fish farm manager: RETIRED  Tobacco Use  . Smoking status: Never Smoker  . Smokeless tobacco: Never Used  Vaping Use  . Vaping Use: Never used  Substance and Sexual Activity  . Alcohol use: Yes    Alcohol/week: 2.0 standard drinks    Types: 2 Standard drinks or equivalent per week    Comment: regular  . Drug use: No  . Sexual activity: Yes  Other Topics Concern  . Not on file  Social  History Narrative   Lives in [place] with her husband.  No children 2 step kids.  Former Psychologist, prison and probation services   Enjoys exercise- water classes, yoga Editor, commissioning.    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      Has 1 cat lives alone no kids but see above    Social Determinants of Health   Financial Resource Strain: Not on file  Food Insecurity: Not on file  Transportation Needs: Not on file  Physical Activity: Not on file  Stress: Not on file  Social Connections: Not on file  Intimate Partner Violence: Not on file    Past Medical History, Surgical history, Social history, and Family history were reviewed and updated as appropriate.   Please see review of systems for further details on the patient's review from today.   Objective:   Physical Exam:  There were no vitals taken for this visit.  Physical Exam Constitutional:      General: She is not in acute distress.    Appearance: She is well-developed.  Musculoskeletal:        General: No deformity.  Neurological:     Mental Status: She is alert and oriented to person, place, and time.     Motor: No tremor.     Coordination: Coordination normal.     Gait: Gait normal.  Psychiatric:        Attention and Perception: She is attentive.        Mood and Affect: Mood is not anxious or depressed. Affect is not labile, blunt, angry or inappropriate.        Speech: Speech normal.        Behavior: Behavior normal. Behavior is not slowed.        Thought Content: Thought content normal. Thought content does not include homicidal or suicidal ideation. Thought content does not include homicidal or suicidal plan.  Cognition and Memory: She exhibits impaired recent memory.        Judgment: Judgment normal.     Comments: Insight and judgment good.   Pleasant and upbeat. No auditory or visual hallucinations. No delusions.      Lab Review:     Component Value Date/Time   NA 137 02/17/2020 0702   NA 139 08/20/2017 1619   NA 136 11/07/2014 1539   K 4.1 02/17/2020 0702   K 4.3 11/07/2014 1539   CL 101 02/17/2020 0702   CL 100 (L) 11/07/2014 1539   CO2 26 02/17/2020 0702   CO2 28 11/07/2014 1539   GLUCOSE 104 (H) 02/17/2020 0702   GLUCOSE 107 (H) 11/07/2014 1539   BUN 10 02/17/2020 0702   BUN 17 08/20/2017 1619   BUN 22 (H) 11/07/2014 1539   CREATININE 0.61 02/17/2020 0702   CREATININE 0.60 11/07/2014 1539   CALCIUM 8.3 (L) 02/17/2020 0702   CALCIUM 9.2 11/07/2014 1539   PROT 7.1 07/03/2020 1041   ALBUMIN 4.3 07/03/2020 1041   AST 29 07/03/2020 1041   ALT 23 07/03/2020 1041   ALKPHOS 59 07/03/2020 1041   BILITOT 0.7 07/03/2020 1041   GFRNONAA >60 02/17/2020 0702   GFRNONAA >60 11/07/2014 1539   GFRAA >60 02/17/2020 0702   GFRAA >60 11/07/2014 1539       Component Value Date/Time   WBC 6.7 02/17/2020 0702   RBC 4.13 02/17/2020 0702   HGB 12.9 02/17/2020 0702   HGB 13.0 08/20/2017 1619   HCT 38.6 02/17/2020 0702   HCT 38.1 08/20/2017 1619   PLT 233 02/17/2020 0702   PLT 286 08/20/2017 1619   MCV 93.5 02/17/2020 0702   MCV 89 08/20/2017 1619   MCV 92 11/07/2014 1539   MCH 31.2 02/17/2020 0702   MCHC 33.4 02/17/2020 0702   RDW 14.3 02/17/2020 0702   RDW 14.1 08/20/2017 1619   RDW 13.9 11/07/2014 1539   LYMPHSABS 2.1 02/06/2020 1022   MONOABS 0.7 02/06/2020 1022   EOSABS 0.3 02/06/2020 1022   BASOSABS 0.0 02/06/2020 1022    No results found for: POCLITH, LITHIUM   No results found for: PHENYTOIN, PHENOBARB, VALPROATE, CBMZ   .res Assessment: Plan:    Major depressive disorder, recurrent episode, moderate ([name])  Attention deficit hyperactivity disorder (ADHD), other type - Plan: amphetamine-dextroamphetamine (ADDERALL XR) 10 MG 24 hr capsule  Generalized anxiety disorder  Anxiety - Plan: DULoxetine (CYMBALTA) 60 MG capsule acquired after stroke.    Benefit Dexedrine XR or  Adderall XR for cost.  Wants to restart it DT benefit.  She is markedly better than when last seen.  Done well with duloxetine 120 mg daily.  Continue duloxetine and no med changes.  Forgets occ.  Disc withdrawal.  Cont dexadrine DT benefit. Last PDMP was filled 04/23/20.  She takes it prn and wants to continue it.  Grief is better  No med changes indicated.  FU 6 mos  Lynder Parents, MD, DFAPA   Please see After Visit Summary for patient specific instructions.  Future Appointments  Date Time Provider [place]  10/07/2020  7:00 AM [facility] DEVICE REMOTES [facility]  11/11/2020  7:00 AM [facility] DEVICE REMOTES [facility]  12/04/2020  1:30 PM McLean-Scocuzza, Nino Glow, MD [facility]  12/16/2020  7:00 AM [facility] DEVICE REMOTES [facility]  01/20/2021  7:00 AM [facility] DEVICE REMOTES [facility]  02/24/2021  7:00 AM [facility] DEVICE REMOTES [facility]  03/31/2021  7:00 AM [facility] DEVICE REMOTES [facility]  [facility]  08/15/2021 12:30 PM O'Brien-Blaney, Denisa L, LPN [facility]    No orders of the defined types were placed in this encounter.     -------------------------------

## 2020-09-14 ENCOUNTER — Other Ambulatory Visit (HOSPITAL_COMMUNITY): Payer: Self-pay | Admitting: Nurse Practitioner

## 2020-09-20 ENCOUNTER — Ambulatory Visit (INDEPENDENT_AMBULATORY_CARE_PROVIDER_SITE_OTHER): Payer: PPO

## 2020-09-20 DIAGNOSIS — I480 Paroxysmal atrial fibrillation: Secondary | ICD-10-CM

## 2020-09-20 LAB — CUP PACEART REMOTE DEVICE CHECK
Date Time Interrogation Session: 20220217233635
Implantable Pulse Generator Implant Date: 20201210

## 2020-09-24 NOTE — Progress Notes (Signed)
Carelink Summary Report / Loop Recorder 

## 2020-10-06 ENCOUNTER — Other Ambulatory Visit: Payer: Self-pay | Admitting: Internal Medicine

## 2020-10-06 ENCOUNTER — Other Ambulatory Visit: Payer: Self-pay | Admitting: Psychiatry

## 2020-10-07 ENCOUNTER — Telehealth: Payer: Self-pay | Admitting: Internal Medicine

## 2020-10-07 ENCOUNTER — Other Ambulatory Visit: Payer: Self-pay

## 2020-10-09 ENCOUNTER — Other Ambulatory Visit: Payer: Self-pay

## 2020-10-09 ENCOUNTER — Ambulatory Visit (INDEPENDENT_AMBULATORY_CARE_PROVIDER_SITE_OTHER): Payer: PPO | Admitting: Internal Medicine

## 2020-10-09 ENCOUNTER — Encounter: Payer: Self-pay | Admitting: Internal Medicine

## 2020-10-09 VITALS — BP 110/70 | HR 84 | Temp 98.5°F | Ht 69.0 in | Wt 171.2 lb

## 2020-10-09 DIAGNOSIS — F32A Depression, unspecified: Secondary | ICD-10-CM | POA: Diagnosis not present

## 2020-10-09 DIAGNOSIS — E559 Vitamin D deficiency, unspecified: Secondary | ICD-10-CM

## 2020-10-09 DIAGNOSIS — Z1231 Encounter for screening mammogram for malignant neoplasm of breast: Secondary | ICD-10-CM

## 2020-10-09 DIAGNOSIS — E039 Hypothyroidism, unspecified: Secondary | ICD-10-CM | POA: Diagnosis not present

## 2020-10-09 DIAGNOSIS — N3000 Acute cystitis without hematuria: Secondary | ICD-10-CM | POA: Diagnosis not present

## 2020-10-09 DIAGNOSIS — I480 Paroxysmal atrial fibrillation: Secondary | ICD-10-CM | POA: Diagnosis not present

## 2020-10-09 DIAGNOSIS — R739 Hyperglycemia, unspecified: Secondary | ICD-10-CM | POA: Diagnosis not present

## 2020-10-09 DIAGNOSIS — M810 Age-related osteoporosis without current pathological fracture: Secondary | ICD-10-CM

## 2020-10-09 DIAGNOSIS — F419 Anxiety disorder, unspecified: Secondary | ICD-10-CM

## 2020-10-09 DIAGNOSIS — E785 Hyperlipidemia, unspecified: Secondary | ICD-10-CM | POA: Diagnosis not present

## 2020-10-09 NOTE — Progress Notes (Signed)
Chief Complaint  Patient presents with  . Emotions  . Shaking   F/u  1. Increased anxiety and mood d/o PHQ 9 score 14 GAD 7 13 today has Dr. Clovis Pu on ativan 0.5 tid prn she walking yoga going to [place] she has feeling jittery/anxiety/forgetfull she does have chronic tremor on gabapentin 200 mg bid on cybalta 120 mg qd, adderrral xl 10 mg qd  Loneliness and finances are triggers to anxiety    Review of Systems  Constitutional: Negative for weight loss.  HENT: Negative for hearing loss.   Eyes: Negative for blurred vision.  Respiratory: Negative for shortness of breath.   Cardiovascular: Negative for chest pain.  Gastrointestinal: Negative for heartburn.  Musculoskeletal: Negative for falls and joint pain.  Skin: Negative for rash.  Neurological: Positive for tremors. Negative for headaches.  Psychiatric/Behavioral: Positive for memory loss. The patient is nervous/anxious.    Past Medical History:  Diagnosis Date  . Actinic keratosis 12/20/2012   left nasal tip  . Anxiety    controlled with meds  . Anxiety   . Arrhythmia   . Atrial fibrillation ([name])    paroxysmal, failed medical therapy with flecainide,  NSVT with tikosyn  . Basal cell carcinoma 12/20/2012   left proximal mandible  . Bruises easily   . CVA (cerebral vascular accident) ([place]) 12/2007  . Depression    medically controlled   . Glaucoma    also with macular holes AE Dr. Thomasene Ripple  . History of loop recorder   . Hypothyroidism   . Low blood pressure    no falls, but if gets up to fast  . RLS (restless legs syndrome)   . Stroke [facility]) 7 years ago   Lasting effects on balance, different personality.  . Thyroid disease   . Tremor   . 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 ATRIAL FIBRILLATION ABLATION  10/18/12   PVI by Dr Rayann Heman  . ATRIAL FIBRILLATION ABLATION N/A 10/18/2012   Procedure: ATRIAL FIBRILLATION ABLATION;   Surgeon: Thompson Grayer, MD;  Location: [facility] CATH LAB;  Service: Cardiovascular;  Laterality: N/A;  . ATRIAL FIBRILLATION ABLATION N/A 04/18/2019   Procedure: ATRIAL FIBRILLATION ABLATION;  Surgeon: Thompson Grayer, MD;  Location: [facility] CV LAB;  Service: Cardiovascular;  Laterality: N/A;  . EUS N/A 10/19/2019   Procedure: FULL UPPER ENDOSCOPIC ULTRASOUND (EUS) RADIAL;  Surgeon: Holly Bodily, MD;  Location: [facility] ENDOSCOPY;  Service: Gastroenterology;  Laterality: N/A;  . EYE SURGERY     on L/R eye, macular hole   . FRACTURE SURGERY    . implantable loop recorder placement  07/13/2019   MDT Reveal LINQ1 [facility] [id number]) implanted in office by Dr Rayann Heman for afib management post ablation  . OPEN REDUCTION INTERNAL FIXATION (ORIF) DISTAL RADIAL FRACTURE Right 11/15/2014   Procedure: OPEN REDUCTION INTERNAL FIXATION (ORIF) RIGHT DISTAL RADIAL FRACTURE WITH ALLOGRAFT BONE GRAFT;  Surgeon: Roseanne Kaufman, MD;  Location: [place];  Service: Orthopedics;  Laterality: Right;  . PANCREATECTOMY N/A 02/13/2020   Procedure: LAPAROSCOPIC DISTAL PANCREATECTOMY;  Surgeon: Stark Klein, MD;  Location: [place];  Service: General;  Laterality: N/A;  . TEE WITHOUT CARDIOVERSION N/A 10/18/2012   Procedure: TRANSESOPHAGEAL ECHOCARDIOGRAM (TEE);  Surgeon: Lelon Perla, MD;  Location: [facility] ENDOSCOPY;  Service: Cardiovascular;  Laterality: N/A;  Pre-Ablation at 12pm  . TONSILLECTOMY    . VAGINAL HYSTERECTOMY  Family History  Problem Relation Age of Onset  . Multiple sclerosis Father   . Breast cancer Neg Hx    Social History   Socioeconomic History  . Marital status: Widowed    Spouse name: Not on file  . Number of children: 0  . Years of education: Not on file  . Highest education level: Not on file  Occupational History  . Occupation: Retired    Fish farm manager: RETIRED  Tobacco Use  . Smoking status: Never Smoker  . Smokeless tobacco: Never Used  Vaping Use  . Vaping Use: Never used   Substance and Sexual Activity  . Alcohol use: Yes    Alcohol/week: 2.0 standard drinks    Types: 2 Standard drinks or equivalent per week    Comment: regular  . Drug use: No  . Sexual activity: Yes  Other Topics Concern  . Not on file  Social History Narrative   Lives in [place] with her husband.  No children 2 step kids.  Former Psychologist, prison and probation services   Enjoys exercise- water classes, yoga Editor, commissioning.    Widowed 2019    Only child and mother was only child   Used to sail with husband      Has a living will-    Would desire CPR   Would not want prolonged life support if futile.      Has 1 cat lives alone no kids but see above    Social Determinants of Health   Financial Resource Strain: Not on file  Food Insecurity: Not on file  Transportation Needs: Not on file  Physical Activity: Not on file  Stress: Not on file  Social Connections: Not on file  Intimate Partner Violence: Not on file   Current Meds  Medication Sig  . acetaminophen (TYLENOL) 500 MG tablet Take 1,000 mg by mouth every 6 (six) hours as needed (pain).   Marland Kitchen amphetamine-dextroamphetamine (ADDERALL XR) 10 MG 24 hr capsule Take 1 capsule (10 mg total) by mouth daily.  Marland Kitchen apixaban (ELIQUIS) 5 MG TABS tablet Take 1 tablet (5 mg total) by mouth in the morning and at bedtime.  . B Complex-C (B-COMPLEX WITH VITAMIN C) tablet Take 1 tablet by mouth daily.  . blood glucose meter kit and supplies KIT Dispense based on patient and insurance preference. Use up to four times daily as directed. (FOR ICD-9 250.00, 250.01).  . Calcium Carb-Cholecalciferol (CALCIUM 600+D3 PO) Take 2 tablets by mouth daily.  . Cholecalciferol (VITAMIN D3) 50 MCG (2000 UT) TABS Take 4,000 Units by mouth daily.  Marland Kitchen CRANBERRY EXTRACT PO Take 2 tablets by mouth every evening.   . denosumab (PROLIA) 60 MG/ML SOLN injection Inject 60 mg into the skin every 6 (six) months.   . Dextran POWD by Does not apply route.  . diltiazem (CARDIZEM CD) 240 MG  24 hr capsule Take 1 capsule (240 mg total) by mouth daily. May take extra capsule daily as needed for afib. Please make yearly appt with Dr. Rayann Heman for March 2022 for future refills. Thank you 1st attempt  . DULoxetine (CYMBALTA) 60 MG capsule Take 1 capsule (60 mg total) by mouth 2 (two) times daily.  Marland Kitchen estradiol (ESTRACE VAGINAL) 0.1 MG/GM vaginal cream Apply 0.5mg  (pea-sized amount)  just inside the vaginal introitus with a finger-tip on Monday, Wednesday and Friday nights. (Patient taking differently: Apply 0.5mg  (pea-sized amount)  just inside the vaginal introitus with a finger-tip on Monday, Wednesday and Friday nights.)  . flecainide (TAMBOCOR) 100 MG  tablet TAKE 1 TABLET (100 MG TOTAL) BY MOUTH IN THE MORNING AND AT BEDTIME.  Marland Kitchen gabapentin (NEURONTIN) 100 MG capsule Take 2 capsules (200 mg total) by mouth 2 (two) times daily.  Marland Kitchen levothyroxine (SYNTHROID) 25 MCG tablet Take 1 tablet (25 mcg total) by mouth daily before breakfast. 30 minutes before food  . LORazepam (ATIVAN) 0.5 MG tablet TAKE 1 TABLET (0.5 MG TOTAL) BY MOUTH EVERY 8 (EIGHT) HOURS.  . Melatonin 10 MG TABS Take 2.5-5 mg by mouth at bedtime as needed (sleep.).  Marland Kitchen methocarbamol (ROBAXIN) 500 MG tablet Take 1 tablet (500 mg total) by mouth every 6 (six) hours as needed for muscle spasms.  . Multiple Vitamin (MULTIVITAMIN WITH MINERALS) TABS tablet Take 1 tablet by mouth daily. One-A-Day  . ondansetron (ZOFRAN-ODT) 4 MG disintegrating tablet Take 1 tablet (4 mg total) by mouth every 6 (six) hours as needed for nausea.  Marland Kitchen oxyCODONE (OXY IR/ROXICODONE) 5 MG immediate release tablet Take 1-2 tablets (5-10 mg total) by mouth every 4 (four) hours as needed for moderate pain, severe pain or breakthrough pain.  . pravastatin (PRAVACHOL) 10 MG tablet Take 1 tablet (10 mg total) by mouth at bedtime. Take a whole pill  . SALT SUBSTITUTES PO Take 1 tablet by mouth daily as needed (hypotension (prevention of syncope episode)).   Marland Kitchen timolol  (TIMOPTIC) 0.5 % ophthalmic solution Place 1 drop into the left eye at bedtime.   . traMADol (ULTRAM) 50 MG tablet Take 1 tablet (50 mg total) by mouth every 6 (six) hours as needed (mild pain).  . vitamin C (ASCORBIC ACID) 500 MG tablet Take 1,000 mg by mouth daily.   . vitamin E 400 UNIT capsule Take 400 Units by mouth daily.     Allergies  Allergen Reactions  . Darvon Other (See Comments)    Severe panic attacks  . Propoxyphene Other (See Comments)    GI Upset Severe panic attacks GI Upset  . Propoxyphene N-Acetaminophen Other (See Comments)    Severe panic attacks  . Levofloxacin Anxiety and Other (See Comments)    Causes panic attacks.   Recent Results (from the past 2160 hour(s))  CUP PACEART REMOTE DEVICE CHECK     Status: None   Collection Time: 08/17/20 11:39 PM  Result Value Ref Range   Date Time Interrogation Session [id number]    Pulse Generator Manufacturer MERM    Pulse Gen Model [id number] Reveal LINQ    Pulse Gen Serial Number [id number] [place] Clinic Name [facility]    Implantable Pulse Generator Type ICM/ILR    Implantable Pulse Generator Implant Date [id number]   CUP PACEART REMOTE DEVICE CHECK     Status: None   Collection Time: 09/19/20 11:36 PM  Result Value Ref Range   Date Time Interrogation Session [id number]    Pulse Generator Manufacturer MERM    Pulse Gen Model [id number] Reveal LINQ    Pulse Gen Serial Number [id number]    Clinic Name [facility]    Implantable Pulse Generator Type ICM/ILR    Implantable Pulse Generator Implant Date [id number]    Objective  Body mass index is 25.28 kg/m. Wt Readings from Last 3 Encounters:  10/09/20 171 lb 3.2 oz (77.7 kg)  08/14/20 176 lb (79.8 kg)  06/06/20 176 lb (79.8 kg)   Temp Readings from Last 3 Encounters:  10/09/20 98.5 F (36.9 C) (Oral)  06/06/20 98.2 F (36.8 C) (Oral)  03/01/20 98.4 F (36.9 C) (Oral)   BP Readings from  Last 3 Encounters:  10/09/20 110/70  06/06/20 126/88  03/01/20  106/68   Pulse Readings from Last 3 Encounters:  10/09/20 84  06/06/20 75  03/01/20 92    Physical Exam Vitals and nursing note reviewed.  Constitutional:      Appearance: Normal appearance. She is well-developed, well-groomed and overweight.  HENT:     Head: Normocephalic and atraumatic.  Eyes:     Conjunctiva/sclera: Conjunctivae normal.     Pupils: Pupils are equal, round, and reactive to light.  Cardiovascular:     Rate and Rhythm: Normal rate and regular rhythm.     Heart sounds: Normal heart sounds. No murmur heard.   Pulmonary:     Effort: Pulmonary effort is normal.     Breath sounds: Normal breath sounds.  Skin:    General: Skin is warm and dry.  Neurological:     General: No focal deficit present.     Mental Status: She is alert and oriented to person, place, and time. Mental status is at baseline.     Gait: Gait normal.  Psychiatric:        Attention and Perception: Attention and perception normal.        Mood and Affect: Mood and affect normal.        Speech: Speech normal.        Behavior: Behavior normal. Behavior is cooperative.        Thought Content: Thought content normal.        Cognition and Memory: Cognition and memory normal.        Judgment: Judgment normal.     Assessment  Plan  Anxiety and depression - F/u Dr. Clovis Pu  Given [facility] [place] -they have therapy and psychiatry (2 separate appointments) call for both [address]  [facility] [address]  [phone]  Ativan 0.5 tid prn  cymbalta 120 mg qd  adderral 10 mg xl   Osteoporosis, unspecified osteoporosis type, unspecified pathological fracture presence - Plan: DG Bone Density, Vitamin D (25 hydroxy)  Hypothyroidism, unspecified type - Plan: TSH On levo 25 mcg qd   Hyperlipidemia, unspecified hyperlipidemia type - Plan: Comprehensive metabolic panel, Lipid panel  Hyperglycemia - Plan: Hemoglobin A1c  Paroxysmal  atrial fibrillation ([place]) - Plan: Comprehensive metabolic panel, Lipid panel, CBC w/Diff  Acute cystitis without hematuria - Plan: Urinalysis, Routine w reflex microscopic, Urine Culture  Vitamin D deficiency - Plan: Vitamin D (25 hydroxy)   HM Flu shot utd  utd prevnar  pna 23utd Tdap utd  shingrix had 2/2 shots covid vx had 3/3 moderna  Pap out of age window mammo1/14/22 negativeordered repeat Colonoscopy had 03/06/14 diverticulosis  02/17/16 osteoporosis reordered another  dexa + osteoporosis next prolia shot had 10/09/20, ordered repeat dexa Skin [facility] derm p[facility]aw 12/22/19 Aks face x 3 f/u 1 year Dr. Will Bonnet  Echo had 04/11/2019 ef 60-65% She is doing exercise PT at [facility] GI Dr. Allen Norris  Dental Dr. Sandrea Hughs will establish Provider: Dr. Olivia Mackie McLean-Scocuzza-Internal Medicine

## 2020-10-09 NOTE — Patient Instructions (Addendum)
[facility] [place] -they have therapy and psychiatry (2 separate appointments) call for both [address]   [place] [address]  [phone]  Managing Anxiety, Adult After being diagnosed with an anxiety disorder, you may be relieved to know why you have felt or behaved a certain way. You may also feel overwhelmed about the treatment ahead and what it will mean for your life. With care and support, you can manage this condition and recover from it. How to manage lifestyle changes Managing stress and anxiety Stress is your body's reaction to life changes and events, both good and bad. Most stress will last just a few hours, but stress can be ongoing and can lead to more than just stress. Although stress can play a major role in anxiety, it is not the same as anxiety. Stress is usually caused by something external, such as a deadline, test, or competition. Stress normally passes after the triggering event has ended.  Anxiety is caused by something internal, such as imagining a terrible outcome or worrying that something will go wrong that will devastate you. Anxiety often does not go away even after the triggering event is over, and it can become long-term (chronic) worry. It is important to understand the differences between stress and anxiety and to manage your stress effectively so that it does not lead to an anxious response. Talk with your health care provider or a counselor to learn more about reducing anxiety and stress. He or she may suggest tension reduction techniques, such as:  Music therapy. This can include creating or listening to music that you enjoy and that inspires you.  Mindfulness-based meditation. This involves being aware of your normal breaths while not trying to control your breathing. It can be done while sitting or walking.  Centering prayer. This involves focusing on a word, phrase, or sacred image that means something to you and  brings you peace.  Deep breathing. To do this, expand your stomach and inhale slowly through your nose. Hold your breath for 3-5 seconds. Then exhale slowly, letting your stomach muscles relax.  Self-talk. This involves identifying thought patterns that lead to anxiety reactions and changing those patterns.  Muscle relaxation. This involves tensing muscles and then relaxing them. Choose a tension reduction technique that suits your lifestyle and personality. These techniques take time and practice. Set aside 5-15 minutes a day to do them. Therapists can offer counseling and training in these techniques. The training to help with anxiety may be covered by some insurance plans. Other things you can do to manage stress and anxiety include:  Keeping a stress/anxiety diary. This can help you learn what triggers your reaction and then learn ways to manage your response.  Thinking about how you react to certain situations. You may not be able to control everything, but you can control your response.  Making time for activities that help you relax and not feeling guilty about spending your time in this way.  Visual imagery and yoga can help you stay calm and relax.   Medicines Medicines can help ease symptoms. Medicines for anxiety include:  Anti-anxiety drugs.  Antidepressants. Medicines are often used as a primary treatment for anxiety disorder. Medicines will be prescribed by a health care provider. When used together, medicines, psychotherapy, and tension reduction techniques may be the most effective treatment. Relationships Relationships can play a big part in helping you recover. Try to spend more time connecting with trusted friends and family members. Consider going  to couples counseling, taking family education classes, or going to family therapy. Therapy can help you and others better understand your condition. How to recognize changes in your anxiety Everyone responds differently to  treatment for anxiety. Recovery from anxiety happens when symptoms decrease and stop interfering with your daily activities at home or work. This may mean that you will start to:  Have better concentration and focus. Worry will interfere less in your daily thinking.  Sleep better.  Be less irritable.  Have more energy.  Have improved memory. It is important to recognize when your condition is getting worse. Contact your health care provider if your symptoms interfere with home or work and you feel like your condition is not improving. Follow these instructions at home: Activity  Exercise. Most adults should do the following: ? Exercise for at least 150 minutes each week. The exercise should increase your heart rate and make you sweat (moderate-intensity exercise). ? Strengthening exercises at least twice a week.  Get the right amount and quality of sleep. Most adults need 7-9 hours of sleep each night. Lifestyle  Eat a healthy diet that includes plenty of vegetables, fruits, whole grains, low-fat dairy products, and lean protein. Do not eat a lot of foods that are high in solid fats, added sugars, or salt.  Make choices that simplify your life.  Do not use any products that contain nicotine or tobacco, such as cigarettes, e-cigarettes, and chewing tobacco. If you need help quitting, ask your health care provider.  Avoid caffeine, alcohol, and certain over-the-counter cold medicines. These may make you feel worse. Ask your pharmacist which medicines to avoid.   General instructions  Take over-the-counter and prescription medicines only as told by your health care provider.  Keep all follow-up visits as told by your health care provider. This is important. Where to find support You can get help and support from these sources:  Self-help groups.  Online and [employer].  A trusted spiritual leader.  Couples counseling.  Family education classes.  Family  therapy. Where to find more information You may find that joining a support group helps you deal with your anxiety. The following sources can help you locate counselors or support groups near you:  [place]: www.mentalhealth[place].net  Anxiety and Depression Association of [place] (ADAA): https://www.clark.net/  National Alliance on Mental Illness (NAMI): www.nami.org Contact a health care provider if you:  Have a hard time staying focused or finishing daily tasks.  Spend many hours a day feeling worried about everyday life.  Become exhausted by worry.  Start to have headaches, feel tense, or have nausea.  Urinate more than normal.  Have diarrhea. Get help right away if you have:  A racing heart and shortness of breath.  Thoughts of hurting yourself or others. If you ever feel like you may hurt yourself or others, or have thoughts about taking your own life, get help right away. You can go to your nearest emergency department or call:  Your local emergency services (911 in the [place]  A suicide crisis helpline, such as the [place] at (419) 474-0754. This is open 24 hours a day. Summary  Taking steps to learn and use tension reduction techniques can help calm you and help prevent triggering an anxiety reaction.  When used together, medicines, psychotherapy, and tension reduction techniques may be the most effective treatment.  Family, friends, and partners can play a big part in helping you recover from an anxiety disorder. This information  is not intended to replace advice given to you by your health care provider. Make sure you discuss any questions you have with your health care provider. Document Revised: 12/20/2018 Document Reviewed: 12/20/2018 Elsevier Patient Education  [place].

## 2020-10-11 ENCOUNTER — Telehealth: Payer: Self-pay | Admitting: Internal Medicine

## 2020-10-11 NOTE — Telephone Encounter (Signed)
She can call [facility] appt for walk in today

## 2020-10-11 NOTE — Telephone Encounter (Signed)
Patient called Office after hours in regard to hip pain, 7-10 yesterday . Called patient his morning she says she applied Ice and heat alternating and is 100% better today and refused appointment.

## 2020-10-11 NOTE — Telephone Encounter (Signed)
[facility] REC[facility] Patient Name: Monica Whitaker Gender: Female DOB: 1945/12/19 Age: 75 Y 2 M 11 D Return Phone Number: [phone] (Primary), [phone] (Secondary) Address: City/State/Zip: [place] [address] Client [facility] Night - Cl Client Site [place] - Night Physician McClean-Scocuzza, Olivia Mackie Contact Type Call Who Is Calling Patient / Member / Family / Caregiver Call Type Triage / Clinical Relationship To Patient Self Return Phone Number [phone] (Secondary) Chief Complaint Hip pain Reason for Call Symptomatic / Request for [place] states she pulled her hip out. She has tried Tylenol and a heating pad for her hip pain. PCP is Dr. Linus Orn. Translation No Nurse Assessment Nurse: Doyle Askew, RN, Beth Date/Time (Eastern Time): 10/10/2020 5:57:41 PM Confirm and document reason for call. If symptomatic, describe symptoms. ---Caller states she thinks pulled her left hip/back out this afternoon. Caller noticed it after getting out of car. Caller states it is hard to even walk. She has tried Tylenol and a heating pad for her hip pain. PCP is Dr. Linus Orn. Does the patient have any new or worsening symptoms? ---Yes Will a triage be completed? ---Yes Related visit to physician within the last 2 weeks? ---No Does the PT have any chronic conditions? (i.e. diabetes, asthma, this includes High risk factors for pregnancy, etc.) ---No Is this a behavioral health or substance abuse call? ---No Guidelines Guideline Title Affirmed Question Affirmed Notes Nurse Date/Time Eilene Ghazi Time) Hip Pain [1] SEVERE pain (e.g., excruciating, unable to do any normal activities) AND [2] not improved after 2 hours of pain medicine Doyle Askew, RN, [facility] 10/10/2020 5:59:29 PM Disp. Time Eilene Ghazi Time) Disposition Final User 10/10/2020 5:45:56 PM  Attempt made - message left Sunday [place], [facility] 10/10/2020 6:04:27 PM See HCP within 4 Hours (or PCP triage) Yes Doyle Askew, RN, Beth PLEASE NOTE: All timestamps contained within this report are represented as [place] Standard Time. CONFIDENTIALTY NOTICE: This fax transmission is intended only for the addressee. It contains information that is legally privileged, confidential or otherwise protected from use or disclosure. If you are not the intended recipient, you are strictly prohibited from reviewing, disclosing, copying using or disseminating any of this information or taking any action in reliance on or regarding this information. If you have received this fax in error, please notify us immediately by telephone so that we can arrange for its return to [place]. Phone: [phone], Toll-Free: [phone], Fax: [phone] Page: 2 of 2 Call Id: [id number] [place] Disagree/Comply Disagree Caller Understands Yes PreDisposition Home Care Care Advice Given Per Guideline SEE HCP (OR PCP TRIAGE) WITHIN 4 HOURS: * IF OFFICE WILL BE CLOSED AND NO PCP (PRIMARY CARE PROVIDER) SECOND-LEVEL TRIAGE: You need to be seen within the next 3 or 4 hours. A nearby Urgent Care Center [facility]) is often a good source of care. Another choice is to go to the ED. Go sooner if you become worse. PAIN MEDICINES: * For pain relief, you can take either acetaminophen, ibuprofen, or naproxen. * They are over-the-counter (OTC) pain drugs. You can buy them at the drugstore. * ACETAMINOPHEN - REGULAR STRENGTH TYLENOL: Take 650 mg (two 325 mg pills) by mouth every 4 to 6 hours as needed. Each Regular Strength Tylenol pill has 325 mg of acetaminophen. The most you should take each day is 3,250 mg (10 pills a day). * ACETAMINOPHEN - EXTRA STRENGTH TYLENOL: Take 1,000 mg (two 500 mg pills) every 8 hours as needed. Each  Extra Strength Tylenol pill has 500 mg of acetaminophen. The most you should take each day is 3,000 mg (6 pills a day). CALL BACK  IF: * You become worse CARE ADVICE given per Hip Pain (Adult) guideline. Comments User: Melene Muller, RN Date/Time Eilene Ghazi Time): 10/10/2020 6:01:33 PM Caller rates pain at 7/10 on pain scale Referrals REFERRED TO PCP OFFIC

## 2020-10-17 ENCOUNTER — Other Ambulatory Visit: Payer: Self-pay | Admitting: Psychiatry

## 2020-10-17 NOTE — Telephone Encounter (Signed)
Controlled substance 

## 2020-10-21 ENCOUNTER — Ambulatory Visit (INDEPENDENT_AMBULATORY_CARE_PROVIDER_SITE_OTHER): Payer: PPO

## 2020-10-21 DIAGNOSIS — I480 Paroxysmal atrial fibrillation: Secondary | ICD-10-CM | POA: Diagnosis not present

## 2020-10-23 LAB — CUP PACEART REMOTE DEVICE CHECK
Date Time Interrogation Session: 20220323005517
Implantable Pulse Generator Implant Date: 20201210

## 2020-10-24 ENCOUNTER — Encounter: Payer: Self-pay | Admitting: Internal Medicine

## 2020-10-29 ENCOUNTER — Other Ambulatory Visit: Payer: Self-pay | Admitting: Internal Medicine

## 2020-10-29 DIAGNOSIS — I872 Venous insufficiency (chronic) (peripheral): Secondary | ICD-10-CM

## 2020-10-29 NOTE — Progress Notes (Signed)
Carelink Summary Report / Loop Recorder 

## 2020-11-08 ENCOUNTER — Other Ambulatory Visit (HOSPITAL_COMMUNITY): Payer: Self-pay | Admitting: Internal Medicine

## 2020-11-12 DIAGNOSIS — H40052 Ocular hypertension, left eye: Secondary | ICD-10-CM | POA: Diagnosis not present

## 2020-11-17 ENCOUNTER — Other Ambulatory Visit: Payer: Self-pay | Admitting: Internal Medicine

## 2020-11-19 DIAGNOSIS — H40052 Ocular hypertension, left eye: Secondary | ICD-10-CM | POA: Diagnosis not present

## 2020-11-21 ENCOUNTER — Ambulatory Visit (INDEPENDENT_AMBULATORY_CARE_PROVIDER_SITE_OTHER): Payer: PPO

## 2020-11-21 DIAGNOSIS — I480 Paroxysmal atrial fibrillation: Secondary | ICD-10-CM | POA: Diagnosis not present

## 2020-11-25 LAB — CUP PACEART REMOTE DEVICE CHECK
Date Time Interrogation Session: 20220425005628
Implantable Pulse Generator Implant Date: 20201210

## 2020-12-04 ENCOUNTER — Ambulatory Visit: Payer: PPO | Admitting: Internal Medicine

## 2020-12-05 ENCOUNTER — Telehealth: Payer: Self-pay | Admitting: Internal Medicine

## 2020-12-06 ENCOUNTER — Other Ambulatory Visit: Payer: Self-pay | Admitting: Internal Medicine

## 2020-12-06 ENCOUNTER — Other Ambulatory Visit (HOSPITAL_COMMUNITY): Payer: Self-pay | Admitting: Internal Medicine

## 2020-12-06 ENCOUNTER — Telehealth: Payer: Self-pay | Admitting: Psychiatry

## 2020-12-06 NOTE — Telephone Encounter (Signed)
Pt last saw Dr Rayann Heman 10/11/19, pt is overdue for follow-up. Sent msg to scheduler to call pt and make f/u appt.  Last labs 02/17/20 Creat 0.61, age 75, weight 77.7kg, based on specified criteria pt is on appropriate dosage of Eliquis 5mg  BID.  Will refill rx once follow-up appt is made.

## 2020-12-06 NOTE — Telephone Encounter (Signed)
Pt called requesting Rx for Adderall 10 mg 1/d @ [facility] apt 06/10/21

## 2020-12-09 ENCOUNTER — Other Ambulatory Visit: Payer: Self-pay

## 2020-12-09 DIAGNOSIS — F908 Attention-deficit hyperactivity disorder, other type: Secondary | ICD-10-CM

## 2020-12-09 MED ORDER — AMPHETAMINE-DEXTROAMPHET ER 10 MG PO CP24: 10.0000 mg | ORAL_CAPSULE | Freq: Every day | ORAL | 0 refills | Status: DC

## 2020-12-09 NOTE — Telephone Encounter (Signed)
Rx pended for Dr. Clovis Pu to review and send for 90 day

## 2020-12-10 NOTE — Progress Notes (Signed)
Carelink Summary Report / Loop Recorder 

## 2020-12-13 ENCOUNTER — Encounter: Payer: Self-pay | Admitting: Internal Medicine

## 2020-12-16 ENCOUNTER — Emergency Department: Payer: PPO

## 2020-12-16 ENCOUNTER — Inpatient Hospital Stay
Admission: EM | Admit: 2020-12-16 | Discharge: 2020-12-19 | DRG: 563 | Disposition: A | Discharge status: Skilled Nursing Facility | Payer: PPO | Attending: Internal Medicine | Admitting: Internal Medicine

## 2020-12-16 ENCOUNTER — Other Ambulatory Visit: Payer: Self-pay

## 2020-12-16 DIAGNOSIS — K5750 Diverticulosis of both small and large intestine without perforation or abscess without bleeding: Secondary | ICD-10-CM | POA: Diagnosis not present

## 2020-12-16 DIAGNOSIS — Z7901 Long term (current) use of anticoagulants: Secondary | ICD-10-CM

## 2020-12-16 DIAGNOSIS — M4319 Spondylolisthesis, multiple sites in spine: Secondary | ICD-10-CM | POA: Diagnosis not present

## 2020-12-16 DIAGNOSIS — R001 Bradycardia, unspecified: Secondary | ICD-10-CM | POA: Diagnosis not present

## 2020-12-16 DIAGNOSIS — K5900 Constipation, unspecified: Secondary | ICD-10-CM | POA: Diagnosis not present

## 2020-12-16 DIAGNOSIS — E860 Dehydration: Secondary | ICD-10-CM | POA: Diagnosis not present

## 2020-12-16 DIAGNOSIS — M47816 Spondylosis without myelopathy or radiculopathy, lumbar region: Secondary | ICD-10-CM | POA: Diagnosis not present

## 2020-12-16 DIAGNOSIS — M25551 Pain in right hip: Secondary | ICD-10-CM

## 2020-12-16 DIAGNOSIS — E785 Hyperlipidemia, unspecified: Secondary | ICD-10-CM | POA: Diagnosis not present

## 2020-12-16 DIAGNOSIS — Z20822 Contact with and (suspected) exposure to covid-19: Secondary | ICD-10-CM | POA: Diagnosis not present

## 2020-12-16 DIAGNOSIS — G4733 Obstructive sleep apnea (adult) (pediatric): Secondary | ICD-10-CM | POA: Diagnosis not present

## 2020-12-16 DIAGNOSIS — Z885 Allergy status to narcotic agent status: Secondary | ICD-10-CM

## 2020-12-16 DIAGNOSIS — G2581 Restless legs syndrome: Secondary | ICD-10-CM | POA: Diagnosis present

## 2020-12-16 DIAGNOSIS — I517 Cardiomegaly: Secondary | ICD-10-CM | POA: Diagnosis not present

## 2020-12-16 DIAGNOSIS — Z7401 Bed confinement status: Secondary | ICD-10-CM | POA: Diagnosis not present

## 2020-12-16 DIAGNOSIS — Z79899 Other long term (current) drug therapy: Secondary | ICD-10-CM | POA: Diagnosis not present

## 2020-12-16 DIAGNOSIS — Z85828 Personal history of other malignant neoplasm of skin: Secondary | ICD-10-CM | POA: Diagnosis not present

## 2020-12-16 DIAGNOSIS — I480 Paroxysmal atrial fibrillation: Secondary | ICD-10-CM | POA: Diagnosis not present

## 2020-12-16 DIAGNOSIS — W19XXXA Unspecified fall, initial encounter: Secondary | ICD-10-CM | POA: Diagnosis present

## 2020-12-16 DIAGNOSIS — W19XXXD Unspecified fall, subsequent encounter: Secondary | ICD-10-CM | POA: Diagnosis not present

## 2020-12-16 DIAGNOSIS — Z888 Allergy status to other drugs, medicaments and biological substances status: Secondary | ICD-10-CM

## 2020-12-16 DIAGNOSIS — S92315A Nondisplaced fracture of first metatarsal bone, left foot, initial encounter for closed fracture: Secondary | ICD-10-CM | POA: Diagnosis present

## 2020-12-16 DIAGNOSIS — S82391A Other fracture of lower end of right tibia, initial encounter for closed fracture: Secondary | ICD-10-CM | POA: Diagnosis not present

## 2020-12-16 DIAGNOSIS — I700 Atherosclerosis of aorta: Secondary | ICD-10-CM | POA: Diagnosis not present

## 2020-12-16 DIAGNOSIS — I864 Gastric varices: Secondary | ICD-10-CM | POA: Diagnosis not present

## 2020-12-16 DIAGNOSIS — M5137 Other intervertebral disc degeneration, lumbosacral region: Secondary | ICD-10-CM | POA: Diagnosis not present

## 2020-12-16 DIAGNOSIS — K429 Umbilical hernia without obstruction or gangrene: Secondary | ICD-10-CM | POA: Diagnosis not present

## 2020-12-16 DIAGNOSIS — F32A Depression, unspecified: Secondary | ICD-10-CM | POA: Diagnosis present

## 2020-12-16 DIAGNOSIS — Z90410 Acquired total absence of pancreas: Secondary | ICD-10-CM | POA: Diagnosis not present

## 2020-12-16 DIAGNOSIS — K862 Cyst of pancreas: Secondary | ICD-10-CM | POA: Diagnosis not present

## 2020-12-16 DIAGNOSIS — R609 Edema, unspecified: Secondary | ICD-10-CM | POA: Diagnosis not present

## 2020-12-16 DIAGNOSIS — R2681 Unsteadiness on feet: Secondary | ICD-10-CM | POA: Diagnosis not present

## 2020-12-16 DIAGNOSIS — Z9181 History of falling: Secondary | ICD-10-CM

## 2020-12-16 DIAGNOSIS — S8264XD Nondisplaced fracture of lateral malleolus of right fibula, subsequent encounter for closed fracture with routine healing: Secondary | ICD-10-CM | POA: Diagnosis not present

## 2020-12-16 DIAGNOSIS — Z881 Allergy status to other antibiotic agents status: Secondary | ICD-10-CM | POA: Diagnosis not present

## 2020-12-16 DIAGNOSIS — R2689 Other abnormalities of gait and mobility: Secondary | ICD-10-CM | POA: Diagnosis not present

## 2020-12-16 DIAGNOSIS — S92355A Nondisplaced fracture of fifth metatarsal bone, left foot, initial encounter for closed fracture: Secondary | ICD-10-CM

## 2020-12-16 DIAGNOSIS — R404 Transient alteration of awareness: Secondary | ICD-10-CM | POA: Diagnosis not present

## 2020-12-16 DIAGNOSIS — M47812 Spondylosis without myelopathy or radiculopathy, cervical region: Secondary | ICD-10-CM | POA: Diagnosis not present

## 2020-12-16 DIAGNOSIS — R52 Pain, unspecified: Secondary | ICD-10-CM | POA: Diagnosis not present

## 2020-12-16 DIAGNOSIS — S8261XA Displaced fracture of lateral malleolus of right fibula, initial encounter for closed fracture: Secondary | ICD-10-CM | POA: Diagnosis not present

## 2020-12-16 DIAGNOSIS — R410 Disorientation, unspecified: Secondary | ICD-10-CM | POA: Diagnosis not present

## 2020-12-16 DIAGNOSIS — I959 Hypotension, unspecified: Secondary | ICD-10-CM | POA: Diagnosis not present

## 2020-12-16 DIAGNOSIS — G250 Essential tremor: Secondary | ICD-10-CM | POA: Diagnosis not present

## 2020-12-16 DIAGNOSIS — S82201D Unspecified fracture of shaft of right tibia, subsequent encounter for closed fracture with routine healing: Secondary | ICD-10-CM | POA: Diagnosis not present

## 2020-12-16 DIAGNOSIS — D378 Neoplasm of uncertain behavior of other specified digestive organs: Secondary | ICD-10-CM | POA: Diagnosis not present

## 2020-12-16 DIAGNOSIS — M4186 Other forms of scoliosis, lumbar region: Secondary | ICD-10-CM | POA: Diagnosis not present

## 2020-12-16 DIAGNOSIS — I4811 Longstanding persistent atrial fibrillation: Secondary | ICD-10-CM | POA: Diagnosis present

## 2020-12-16 DIAGNOSIS — E039 Hypothyroidism, unspecified: Secondary | ICD-10-CM | POA: Diagnosis present

## 2020-12-16 DIAGNOSIS — R55 Syncope and collapse: Secondary | ICD-10-CM | POA: Diagnosis not present

## 2020-12-16 DIAGNOSIS — Z79890 Hormone replacement therapy (postmenopausal): Secondary | ICD-10-CM | POA: Diagnosis not present

## 2020-12-16 DIAGNOSIS — G8929 Other chronic pain: Secondary | ICD-10-CM | POA: Diagnosis not present

## 2020-12-16 DIAGNOSIS — R296 Repeated falls: Secondary | ICD-10-CM | POA: Diagnosis not present

## 2020-12-16 DIAGNOSIS — H409 Unspecified glaucoma: Secondary | ICD-10-CM | POA: Diagnosis not present

## 2020-12-16 DIAGNOSIS — Z8673 Personal history of transient ischemic attack (TIA), and cerebral infarction without residual deficits: Secondary | ICD-10-CM

## 2020-12-16 DIAGNOSIS — M419 Scoliosis, unspecified: Secondary | ICD-10-CM | POA: Diagnosis not present

## 2020-12-16 DIAGNOSIS — Z483 Aftercare following surgery for neoplasm: Secondary | ICD-10-CM | POA: Diagnosis not present

## 2020-12-16 DIAGNOSIS — S82891A Other fracture of right lower leg, initial encounter for closed fracture: Secondary | ICD-10-CM

## 2020-12-16 DIAGNOSIS — S82301D Unspecified fracture of lower end of right tibia, subsequent encounter for closed fracture with routine healing: Secondary | ICD-10-CM | POA: Diagnosis not present

## 2020-12-16 DIAGNOSIS — S92312A Displaced fracture of first metatarsal bone, left foot, initial encounter for closed fracture: Secondary | ICD-10-CM | POA: Diagnosis not present

## 2020-12-16 DIAGNOSIS — Z043 Encounter for examination and observation following other accident: Secondary | ICD-10-CM | POA: Diagnosis not present

## 2020-12-16 DIAGNOSIS — I482 Chronic atrial fibrillation, unspecified: Secondary | ICD-10-CM | POA: Diagnosis not present

## 2020-12-16 DIAGNOSIS — Z66 Do not resuscitate: Secondary | ICD-10-CM | POA: Diagnosis not present

## 2020-12-16 DIAGNOSIS — I4891 Unspecified atrial fibrillation: Secondary | ICD-10-CM | POA: Diagnosis not present

## 2020-12-16 DIAGNOSIS — S82899A Other fracture of unspecified lower leg, initial encounter for closed fracture: Secondary | ICD-10-CM | POA: Diagnosis present

## 2020-12-16 DIAGNOSIS — F419 Anxiety disorder, unspecified: Secondary | ICD-10-CM | POA: Diagnosis present

## 2020-12-16 DIAGNOSIS — Z8507 Personal history of malignant neoplasm of pancreas: Secondary | ICD-10-CM

## 2020-12-16 DIAGNOSIS — R102 Pelvic and perineal pain: Secondary | ICD-10-CM | POA: Diagnosis not present

## 2020-12-16 DIAGNOSIS — Z820 Family history of epilepsy and other diseases of the nervous system: Secondary | ICD-10-CM

## 2020-12-16 DIAGNOSIS — J9811 Atelectasis: Secondary | ICD-10-CM | POA: Diagnosis not present

## 2020-12-16 DIAGNOSIS — M2550 Pain in unspecified joint: Secondary | ICD-10-CM | POA: Diagnosis not present

## 2020-12-16 DIAGNOSIS — W19XXXS Unspecified fall, sequela: Secondary | ICD-10-CM | POA: Diagnosis not present

## 2020-12-16 DIAGNOSIS — M79671 Pain in right foot: Secondary | ICD-10-CM | POA: Diagnosis not present

## 2020-12-16 DIAGNOSIS — M6281 Muscle weakness (generalized): Secondary | ICD-10-CM | POA: Diagnosis not present

## 2020-12-16 DIAGNOSIS — F0390 Unspecified dementia without behavioral disturbance: Secondary | ICD-10-CM | POA: Diagnosis not present

## 2020-12-16 DIAGNOSIS — G4700 Insomnia, unspecified: Secondary | ICD-10-CM | POA: Diagnosis present

## 2020-12-16 DIAGNOSIS — M7989 Other specified soft tissue disorders: Secondary | ICD-10-CM | POA: Diagnosis not present

## 2020-12-16 DIAGNOSIS — F329 Major depressive disorder, single episode, unspecified: Secondary | ICD-10-CM | POA: Diagnosis not present

## 2020-12-16 DIAGNOSIS — R42 Dizziness and giddiness: Secondary | ICD-10-CM | POA: Diagnosis not present

## 2020-12-16 LAB — COMPREHENSIVE METABOLIC PANEL
ALT: 26 U/L (ref 0–44)
AST: 31 U/L (ref 15–41)
Albumin: 3.7 g/dL (ref 3.5–5.0)
Alkaline Phosphatase: 52 U/L (ref 38–126)
Anion gap: 9 (ref 5–15)
BUN: 40 mg/dL — ABNORMAL HIGH (ref 8–23)
CO2: 23 mmol/L (ref 22–32)
Calcium: 9 mg/dL (ref 8.9–10.3)
Chloride: 104 mmol/L (ref 98–111)
Creatinine, Ser: 0.94 mg/dL (ref 0.44–1.00)
GFR, Estimated: >60 mL/min (ref >60)
Glucose, Bld: 130 mg/dL — ABNORMAL HIGH (ref 70–99)
Potassium: 4.6 mmol/L (ref 3.5–5.1)
Sodium: 136 mmol/L (ref 135–145)
Total Bilirubin: 0.7 mg/dL (ref 0.3–1.2)
Total Protein: 6.4 g/dL — ABNORMAL LOW (ref 6.5–8.1)

## 2020-12-16 LAB — CBC WITH DIFFERENTIAL/PLATELET
Abs Immature Granulocytes: 0.04 10*3/uL (ref 0.00–0.07)
Basophils Absolute: 0.1 10*3/uL (ref 0.0–0.1)
Basophils Relative: 1 %
Eosinophils Absolute: 0.1 10*3/uL (ref 0.0–0.5)
Eosinophils Relative: 1 %
HCT: 34.6 % — ABNORMAL LOW (ref 36.0–46.0)
Hemoglobin: 11.4 g/dL — ABNORMAL LOW (ref 12.0–15.0)
Immature Granulocytes: 0 %
Lymphocytes Relative: 17 %
Lymphs Abs: 1.7 10*3/uL (ref 0.7–4.0)
MCH: 30 pg (ref 26.0–34.0)
MCHC: 32.9 g/dL (ref 30.0–36.0)
MCV: 91.1 fL (ref 80.0–100.0)
Monocytes Absolute: 0.9 10*3/uL (ref 0.1–1.0)
Monocytes Relative: 9 %
Neutro Abs: 6.8 10*3/uL (ref 1.7–7.7)
Neutrophils Relative %: 72 %
Platelets: 258 10*3/uL (ref 150–400)
RBC: 3.8 MIL/uL — ABNORMAL LOW (ref 3.87–5.11)
RDW: 13.9 % (ref 11.5–15.5)
WBC: 9.5 10*3/uL (ref 4.0–10.5)
nRBC: 0 % (ref 0.0–0.2)

## 2020-12-16 LAB — TYPE AND SCREEN
ABO/RH(D): A POS
Antibody Screen: NEGATIVE

## 2020-12-16 LAB — RESP PANEL BY RT-PCR (FLU A&B, COVID) ARPGX2
Influenza A by PCR: NEGATIVE
Influenza B by PCR: NEGATIVE
SARS Coronavirus 2 by RT PCR: NEGATIVE

## 2020-12-16 LAB — MAGNESIUM: Magnesium: 2.1 mg/dL (ref 1.7–2.4)

## 2020-12-16 LAB — LACTIC ACID, PLASMA
Lactic Acid, Venous: 2.1 mmol/L (ref 0.5–1.9)
Lactic Acid, Venous: 1.1 mmol/L (ref 0.5–1.9)

## 2020-12-16 LAB — PROTIME-INR
INR: 1.4 — ABNORMAL HIGH (ref 0.8–1.2)
Prothrombin Time: 16.9 seconds — ABNORMAL HIGH (ref 11.4–15.2)

## 2020-12-16 LAB — TROPONIN I (HIGH SENSITIVITY)
Troponin I (High Sensitivity): 4 ng/L (ref <18)
Troponin I (High Sensitivity): 5 ng/L (ref <18)

## 2020-12-16 MED ORDER — LACTATED RINGERS IV BOLUS
1000.0000 mL | Freq: Once | INTRAVENOUS | Status: AC
  Administered 2020-12-16: 1000 mL via INTRAVENOUS

## 2020-12-16 MED ORDER — SENNOSIDES-DOCUSATE SODIUM 8.6-50 MG PO TABS: 1.0000 | ORAL_TABLET | Freq: Every evening | ORAL | Status: DC | PRN

## 2020-12-16 MED ORDER — TIMOLOL MALEATE 0.5 % OP SOLN
1.0000 [drp] | Freq: Every day | OPHTHALMIC | Status: DC
  Administered 2020-12-16 – 2020-12-18 (×3): 1 [drp] via OPHTHALMIC
  Filled 2020-12-16: qty 5

## 2020-12-16 MED ORDER — ACETAMINOPHEN 500 MG PO TABS
1000.0000 mg | ORAL_TABLET | Freq: Four times a day (QID) | ORAL | Status: DC | PRN
  Administered 2020-12-17 – 2020-12-18 (×3): 1000 mg via ORAL
  Filled 2020-12-16 (×3): qty 2

## 2020-12-16 MED ORDER — LEVOTHYROXINE SODIUM 25 MCG PO TABS
25.0000 ug | ORAL_TABLET | Freq: Every day | ORAL | Status: DC
  Administered 2020-12-17 – 2020-12-19 (×3): 25 ug via ORAL
  Filled 2020-12-16 (×3): qty 1

## 2020-12-16 MED ORDER — ACETAMINOPHEN 500 MG PO TABS
1000.0000 mg | ORAL_TABLET | Freq: Once | ORAL | Status: AC
  Administered 2020-12-16: 1000 mg via ORAL
  Filled 2020-12-16: qty 2

## 2020-12-16 MED ORDER — ALBUTEROL SULFATE (2.5 MG/3ML) 0.083% IN NEBU: 2.5000 mg | INHALATION_SOLUTION | Freq: Four times a day (QID) | RESPIRATORY_TRACT | Status: DC | PRN

## 2020-12-16 MED ORDER — IOHEXOL 300 MG/ML  SOLN
100.0000 mL | Freq: Once | INTRAMUSCULAR | Status: AC | PRN
  Administered 2020-12-16: 75 mL via INTRAVENOUS
  Filled 2020-12-16: qty 100

## 2020-12-16 MED ORDER — FLECAINIDE ACETATE 100 MG PO TABS
100.0000 mg | ORAL_TABLET | Freq: Two times a day (BID) | ORAL | Status: DC
  Administered 2020-12-17 – 2020-12-19 (×5): 100 mg via ORAL
  Filled 2020-12-16 (×6): qty 1

## 2020-12-16 MED ORDER — PRAVASTATIN SODIUM 10 MG PO TABS
10.0000 mg | ORAL_TABLET | Freq: Every day | ORAL | Status: DC
  Administered 2020-12-16 – 2020-12-18 (×3): 10 mg via ORAL
  Filled 2020-12-16 (×4): qty 1

## 2020-12-16 MED ORDER — DULOXETINE HCL 60 MG PO CPEP
60.0000 mg | ORAL_CAPSULE | Freq: Two times a day (BID) | ORAL | Status: DC
  Administered 2020-12-17 – 2020-12-19 (×5): 60 mg via ORAL
  Filled 2020-12-16 (×7): qty 1

## 2020-12-16 MED ORDER — AMPHETAMINE-DEXTROAMPHET ER 5 MG PO CP24
10.0000 mg | ORAL_CAPSULE | Freq: Every day | ORAL | Status: DC | PRN
  Filled 2020-12-16: qty 2

## 2020-12-16 MED ORDER — LACTULOSE 10 GM/15ML PO SOLN
20.0000 g | Freq: Once | ORAL | Status: AC
  Administered 2020-12-16: 20 g via ORAL
  Filled 2020-12-16: qty 30

## 2020-12-16 NOTE — ED Provider Notes (Addendum)
[facility] Emergency Department Provider Note  ____________________________________________   Event Date/Time   First MD Initiated Contact with Patient 12/16/20 1514     (approximate)  I have reviewed the triage vital signs and the nursing notes.   HISTORY  Chief Complaint Fall and Ankle Pain   HPI Monica Whitaker is a 75 y.o. female with past medical history of A. fib on Eliquis, CVA, RLS, anxiety, glaucoma, hypothyroidism and baseline tremor who presents via EMS from independent living facility for assessment after patient had a fall.  Patient states she had 3 falls today.  She is not sure when she first fell but her third fall currently prior to arrival.  She states she is taking her Eliquis I think she probably took it this morning.  She thinks she definitely hit her head during one of the falls.  She does not currently have a headache and did not have any LOC.  States right now she only has pain in her right ankle and left foot.  She denies any neck pain, back pain, chest pain, Donnell pain or pain in her bilateral knees, left ankle, hips, shoulders, elbows, wrists or elsewhere.  She states she has had little bit of a cough today and felt lightheaded but has not had any chest pain, abdominal pain, vomiting, diarrhea, dysuria, rash or other recent falls prior to today as it has been only several months since her last fall.  Per EMS initial BP was 94/50 and patient received 500 cc of normal saline prior to arrival.         Past Medical History:  Diagnosis Date  . Actinic keratosis 12/20/2012   left nasal tip  . Anxiety    controlled with meds  . Anxiety   . Arrhythmia   . Atrial fibrillation ([name])    paroxysmal, failed medical therapy with flecainide,  NSVT with tikosyn  . Basal cell carcinoma 12/20/2012   left proximal mandible  . Bruises easily   . CVA (cerebral vascular accident) ([place]) 12/2007  . Depression    medically controlled   . Glaucoma     also with macular holes AE Dr. Thomasene Ripple  . History of loop recorder   . Hypothyroidism   . Low blood pressure    no falls, but if gets up to fast  . RLS (restless legs syndrome)   . Stroke [facility]) 7 years ago   Lasting effects on balance, different personality.  . Thyroid disease   . Tremor   . Ventricular tachycardia ([place])    pt told at [facility] that she had RVOT VT    Patient Active Problem List   Diagnosis Date Noted  . IPMN (intraductal papillary mucinous neoplasm) 03/01/2020  . Lung nodule 03/01/2020  . Scarring of lung following radiation ([place]) 03/01/2020  . Pancreatic mass 02/13/2020  . Panic attack 12/15/2019  . Cystic mass of pancreas 12/12/2019  . Essential tremor 09/22/2019  . Hyperlipidemia 09/22/2019  . Chronic female pelvic pain 05/24/2019  . Atrial fibrillation ([place])   . Anxiety   . Tremor 05/10/2019  . RLS (restless legs syndrome) 05/10/2019  . History of stroke 05/10/2019  . Orthostatic hypotension 11/04/2017  . Hypothyroidism 11/04/2017  . OSA on CPAP 06/21/2017  . Persistent atrial fibrillation 06/21/2017  . Cerebrovascular accident (CVA) due to embolism of left middle cerebral artery ([place]) 03/04/2017  . Osteoporosis 12/26/2015  . ANXIETY DEPRESSION 04/10/2010    Past Surgical History:  Procedure Laterality Date  .  ATRIAL FIBRILLATION ABLATION  10/18/12   PVI by Dr Rayann Heman  . ATRIAL FIBRILLATION ABLATION N/A 10/18/2012   Procedure: ATRIAL FIBRILLATION ABLATION;  Surgeon: Thompson Grayer, MD;  Location: [facility] CATH LAB;  Service: Cardiovascular;  Laterality: N/A;  . ATRIAL FIBRILLATION ABLATION N/A 04/18/2019   Procedure: ATRIAL FIBRILLATION ABLATION;  Surgeon: Thompson Grayer, MD;  Location: [place] CV LAB;  Service: Cardiovascular;  Laterality: N/A;  . EUS N/A 10/19/2019   Procedure: FULL UPPER ENDOSCOPIC ULTRASOUND (EUS) RADIAL;  Surgeon: Holly Bodily, MD;  Location: [facility] ENDOSCOPY;  Service: Gastroenterology;  Laterality: N/A;  . EYE SURGERY     on  L/R eye, macular hole   . FRACTURE SURGERY    . implantable loop recorder placement  07/13/2019   MDT Reveal LINQ1 [facility] [id number]) implanted in office by Dr Rayann Heman for afib management post ablation  . OPEN REDUCTION INTERNAL FIXATION (ORIF) DISTAL RADIAL FRACTURE Right 11/15/2014   Procedure: OPEN REDUCTION INTERNAL FIXATION (ORIF) RIGHT DISTAL RADIAL FRACTURE WITH ALLOGRAFT BONE GRAFT;  Surgeon: Roseanne Kaufman, MD;  Location: [place];  Service: Orthopedics;  Laterality: Right;  . PANCREATECTOMY N/A 02/13/2020   Procedure: LAPAROSCOPIC DISTAL PANCREATECTOMY;  Surgeon: Stark Klein, MD;  Location: [place];  Service: General;  Laterality: N/A;  . TEE WITHOUT CARDIOVERSION N/A 10/18/2012   Procedure: TRANSESOPHAGEAL ECHOCARDIOGRAM (TEE);  Surgeon: Lelon Perla, MD;  Location[facility] ENDOSCOPY;  Service: Cardiovascular;  Laterality: N/A;  Pre-Ablation at 12pm  . TONSILLECTOMY    . VAGINAL HYSTERECTOMY      Prior to Admission medications   Medication Sig Start Date End Date Taking? Authorizing Provider  acetaminophen (TYLENOL) 500 MG tablet Take 1,000 mg by mouth every 6 (six) hours as needed (pain).     [provider]  amphetamine-dextroamphetamine (ADDERALL XR) 10 MG 24 hr capsule Take 1 capsule (10 mg total) by mouth daily. 12/09/20   Cottle, Billey Co., MD  apixaban (ELIQUIS) 5 MG TABS tablet Take 1 tablet (5 mg total) by mouth in the morning and at bedtime. 07/03/20   Allred, Jeneen Rinks, MD  B Complex-C (B-COMPLEX WITH VITAMIN C) tablet Take 1 tablet by mouth daily.    [provider]  blood glucose meter kit and supplies KIT Dispense based on patient and insurance preference. Use up to four times daily as directed. (FOR ICD-9 250.00, 250.01). 02/16/20   Stark Klein, MD  Calcium Carb-Cholecalciferol (CALCIUM 600+D3 PO) Take 2 tablets by mouth daily.    [provider]  Cholecalciferol (VITAMIN D3) 50 MCG (2000 UT) TABS Take 4,000 Units by mouth daily.     [provider]  CRANBERRY EXTRACT PO Take 2 tablets by mouth every evening.     [provider]  denosumab (PROLIA) 60 MG/ML SOLN injection Inject 60 mg into the skin every 6 (six) months.     [provider]  Dextran POWD by Does not apply route.    [provider]  diltiazem (CARDIZEM CD) 240 MG 24 hr capsule Take 1 capsule (240 mg total) by mouth daily. May take extra capsule daily as needed for afib. Please make overdue appt with Dr. Rayann Heman before anymore refills. Thank you 2nd attempt 12/06/20   Thompson Grayer, MD  DULoxetine (CYMBALTA) 60 MG capsule Take 1 capsule (60 mg total) by mouth 2 (two) times daily. 09/09/20   Cottle, Billey Co., MD  estradiol (ESTRACE VAGINAL) 0.1 MG/GM vaginal cream Apply 0.63m (pea-sized amount)  just inside the vaginal introitus with a  finger-tip on Monday, Wednesday and Friday nights. Patient taking differently: Apply 0.95m (pea-sized amount)  just inside the vaginal introitus with a finger-tip on Monday, Wednesday and Friday nights. 05/25/18   McGowan, SHunt Oris PA-C  flecainide (TAMBOCOR) 100 MG tablet TAKE 1 TABLET (100 MG TOTAL) BY MOUTH IN THE MORNING AND AT BEDTIME. 11/19/20   TEvans Lance MD  gabapentin (NEURONTIN) 100 MG capsule Take 2 capsules (200 mg total) by mouth 2 (two) times daily. 09/22/19   McLean-Scocuzza, TNino Glow MD  levothyroxine (SYNTHROID) 25 MCG tablet Take 1 tablet (25 mcg total) by mouth daily before breakfast. 30 minutes before food 06/06/20   McLean-Scocuzza, TNino Glow MD  LORazepam (ATIVAN) 0.5 MG tablet TAKE 1 TABLET (0.5 MG TOTAL) BY MOUTH EVERY 8 (EIGHT) HOURS. 10/17/20   Cottle, CBilley Co, MD  Melatonin 10 MG TABS Take 2.5-5 mg by mouth at bedtime as needed (sleep.).    [provider]  methocarbamol (ROBAXIN) 500 MG tablet Take 1 tablet (500 mg total) by mouth every 6 (six) hours as needed for muscle spasms. 02/16/20   BStark Klein MD  Multiple Vitamin (MULTIVITAMIN WITH MINERALS) TABS  tablet Take 1 tablet by mouth daily. One-A-Day    [provider]  ondansetron (ZOFRAN-ODT) 4 MG disintegrating tablet Take 1 tablet (4 mg total) by mouth every 6 (six) hours as needed for nausea. 02/16/20   BStark Klein MD  oxyCODONE (OXY IR/ROXICODONE) 5 MG immediate release tablet Take 1-2 tablets (5-10 mg total) by mouth every 4 (four) hours as needed for moderate pain, severe pain or breakthrough pain. 02/16/20   BStark Klein MD  pravastatin (PRAVACHOL) 10 MG tablet Take 1 tablet (10 mg total) by mouth at bedtime. Take a whole pill 06/06/20   McLean-Scocuzza, TNino Glow MD  SALT SUBSTITUTES PO Take 1 tablet by mouth daily as needed (hypotension (prevention of syncope episode)).     [provider]  timolol (TIMOPTIC) 0.5 % ophthalmic solution Place 1 drop into the left eye at bedtime.     [provider]  traMADol (ULTRAM) 50 MG tablet Take 1 tablet (50 mg total) by mouth every 6 (six) hours as needed (mild pain). 02/16/20   BStark Klein MD  vitamin C (ASCORBIC ACID) 500 MG tablet Take 1,000 mg by mouth daily.     [provider]  vitamin E 400 UNIT capsule Take 400 Units by mouth daily.      [provider]    Allergies Darvon, Propoxyphene, Propoxyphene n-acetaminophen, and Levofloxacin  Family History  Problem Relation Age of Onset  . Multiple sclerosis Father   . Breast cancer Neg Hx     Social History Social History   Tobacco Use  . Smoking status: Never Smoker  . Smokeless tobacco: Never Used  Vaping Use  . Vaping Use: Never used  Substance Use Topics  . Alcohol use: Yes    Alcohol/week: 2.0 standard drinks    Types: 2 Standard drinks or equivalent per week    Comment: regular  . Drug use: No    Review of Systems  Review of Systems  Constitutional: Positive for malaise/fatigue. Negative for chills and fever.  HENT: Negative for sore throat.   Eyes: Negative for pain.  Respiratory: Positive for cough. Negative for  stridor.   Cardiovascular: Negative for chest pain.  Gastrointestinal: Negative for vomiting.  Musculoskeletal: Positive for falls, joint pain ( R ankle) and myalgias ( L foot).  Skin: Negative for rash.  Neurological: Positive  for dizziness. Negative for seizures, loss of consciousness and headaches.  Psychiatric/Behavioral: Negative for suicidal ideas.  All other systems reviewed and are negative.     ____________________________________________   PHYSICAL EXAM:  VITAL SIGNS: ED Triage Vitals  Enc Vitals Group     BP 12/16/20 1512 (!) 95/50     Pulse Rate 12/16/20 1512 (!) 52     Resp 12/16/20 1512 16     Temp --      Temp src --      SpO2 12/16/20 1512 98 %     Weight 12/16/20 1514 160 lb (72.6 kg)     Height 12/16/20 1514 _0  (1.753 m)     Head Circumference --      Peak Flow --      Pain Score 12/16/20 1513 7     Pain Loc --      Pain Edu? --      Excl. in [place]? --    Vitals:   12/16/20 1745 12/16/20 1800  BP:  (!) 103/55  Pulse:    Resp: 18 16  Temp:    SpO2:     Physical Exam Vitals and nursing note reviewed.  Constitutional:      General: She is not in acute distress.    Appearance: She is well-developed.  HENT:     Head: Normocephalic and atraumatic.     Right Ear: External ear normal.     Left Ear: External ear normal.     Nose: Nose normal.     Mouth/Throat:     Mouth: Mucous membranes are dry.  Eyes:     Conjunctiva/sclera: Conjunctivae normal.  Cardiovascular:     Rate and Rhythm: Normal rate and regular rhythm.     Heart sounds: No murmur heard.   Pulmonary:     Effort: Pulmonary effort is normal. No respiratory distress.     Breath sounds: Normal breath sounds.  Abdominal:     Palpations: Abdomen is soft.     Tenderness: There is no abdominal tenderness.  Musculoskeletal:     Cervical back: Neck supple.  Skin:    General: Skin is warm and dry.     Capillary Refill: Capillary refill takes 2 to 3 seconds.  Neurological:     Mental  Status: She is alert. She is disoriented and confused.  Psychiatric:        Mood and Affect: Mood normal.     2+ bilateral radial and DP pulses.  No tenderness step-offs deformities over the C/T/L-spine.  No obvious trauma including hematoma or laceration over the scalp or face.  Cranial nerves II through XII grossly intact.  Patient has symmetric strength in her bilateral upper and lower extremities with exception of the right ankle which is quite edematous and erythematous patient has minimal minimal strength on plantar and dorsiflexion.  Sensation is intact to light touch of all extremities.  She does have some tenderness over the dorsum and plantar aspect of her left midfoot but no other trauma to the left foot.  No other trauma to the bilateral upper or lower extremities. ____________________________________________   LABS (all labs ordered are listed, but only abnormal results are displayed)  Labs Reviewed  CBC WITH DIFFERENTIAL/PLATELET - Abnormal; Notable for the following components:      Result Value   RBC 3.80 (*)    Hemoglobin 11.4 (*)    HCT 34.6 (*)    All other components within normal limits  COMPREHENSIVE METABOLIC PANEL -  Abnormal; Notable for the following components:   Glucose, Bld 130 (*)    BUN 40 (*)    Total Protein 6.4 (*)    All other components within normal limits  PROTIME-INR - Abnormal; Notable for the following components:   Prothrombin Time 16.9 (*)    INR 1.4 (*)    All other components within normal limits  LACTIC ACID, PLASMA - Abnormal; Notable for the following components:   Lactic Acid, Venous 2.1 (*)    All other components within normal limits  RESP PANEL BY RT-PCR (FLU A&B, COVID) ARPGX2  MAGNESIUM  LACTIC ACID, PLASMA  URINALYSIS, COMPLETE (UACMP) WITH MICROSCOPIC  TROPONIN I (HIGH SENSITIVITY)  TROPONIN I (HIGH SENSITIVITY)   ____________________________________________  EKG  Slow A. fib with a ventricular rate 56, normal axis,  unremarkable intervals without evidence of clear acute ischemia.  ____________________________________________  RADIOLOGY  ED MD interpretation: No clear acute intracranial process skull fracture or bleed on CT head.  CT C-spine shows no clear acute intracranial process.  No evidence of acute injury on CT scan of the chest abdomen pelvis as well as reformats of the T and L-spine.  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  There is also a small cystic lesion at the uncinate process.  There is evidence of diverticulosis without evidence of diverticulitis.  Plain film left foot is unremarkable.  Plain film the right ankle shows a displaced distal tibia fracture and distal fibular fracture.  Plain film of the left foot shows likely small fracture of the first left metatarsal age-indeterminate. Official radiology report(s): DG Ankle Complete Right  Result Date: 12/16/2020 CLINICAL DATA:  Multiple falls, ankle swelling and pain EXAM: RIGHT ANKLE - COMPLETE 3+ VIEW COMPARISON:  Radiographs from 07/31/2013 FINDINGS: Oblique fracture of the lateral malleolus with extensive overlying subcutaneous edema. No well-defined fracture of the medial or posterior malleolus. Plafond and talar dome otherwise intact. IMPRESSION: 1. Oblique fracture lateral malleolus representing at least a stage II Weber B fracture. No definite medial malleolar or posterior malleolar fragments identified. Electronically Signed   By: Van Clines M.D.   On: 12/16/2020 16:22   CT Head Wo Contrast  Result Date: 12/16/2020 CLINICAL DATA:  Dizziness and subsequent fall. EXAM: CT HEAD WITHOUT CONTRAST TECHNIQUE: Contiguous axial images were obtained from the base of the skull through the vertex without intravenous contrast. COMPARISON:  November 09, 2014 FINDINGS: Brain: There is mild cerebral atrophy with widening of the extra-axial spaces and ventricular dilatation.  There are areas of decreased attenuation within the white matter tracts of the supratentorial brain, consistent with microvascular disease changes. An area of encephalomalacia, with adjacent chronic white matter low attenuation, is seen within the left occipital lobe. Vascular: No hyperdense vessel or unexpected calcification. Skull: Normal. Negative for fracture or focal lesion. Sinuses/Orbits: Mild posterior bilateral ethmoid sinus mucosal thickening is seen. Other: None. IMPRESSION: 1. Generalized cerebral atrophy. 2. Chronic left occipital lobe infarct. 3. No acute intracranial abnormality. Electronically Signed   By: Virgina Norfolk M.D.   On: 12/16/2020 16:18   CT Cervical Spine Wo Contrast  Result Date: 12/16/2020 CLINICAL DATA:  Dizziness, fall.  Dementia. EXAM: CT CERVICAL SPINE WITHOUT CONTRAST TECHNIQUE: Multidetector CT imaging of the cervical spine was performed without intravenous contrast. Multiplanar CT image reconstructions were also generated. COMPARISON:  10/30/2015 FINDINGS: Alignment: 2 mm degenerative retrolisthesis at C4-5. 2.5 mm degenerative anterolisthesis at C7-T1. Skull base and vertebrae: No  fracture or acute bony findings identified. Soft tissues and spinal canal: Unremarkable Disc levels: Foraminal stenosis due to uncinate and facet spurring on the right at C3-4 and C5-6; and on the left at C3-4 and C6-7. Suspected disc bulge at C4-5 without significant central narrowing of the thecal sac. Upper chest: Unremarkable Other: No supplemental non-categorized findings. IMPRESSION: 1. No acute cervical spine findings. 2. Cervical spondylosis with mild foraminal impingement at C3-4, C5-6, and C6-7. Electronically Signed   By: Van Clines M.D.   On: 12/16/2020 16:20   CT CHEST ABDOMEN PELVIS W CONTRAST  Result Date: 12/16/2020 CLINICAL DATA:  Post fall. Hypotension. History of partial pancreatectomy for IPMN. EXAM: CT CHEST, ABDOMEN, AND PELVIS WITH CONTRAST TECHNIQUE:  Multidetector CT imaging of the chest, abdomen and pelvis was performed following the standard protocol during bolus administration of intravenous contrast. CONTRAST:  78m OMNIPAQUE IOHEXOL 300 MG/ML  SOLN COMPARISON:  CT abdomen pelvis-08/12/2020; chest CT-03/20/2020; coronary CT-04/13/2019 FINDINGS: CT CHEST FINDINGS Cardiovascular: Normal heart size. No pericardial effusion though a small amount of fluid is seen with the pericardial recess. No evidence of thoracic aortic aneurysm or dissection on this nongated examination. Conventional configuration of the aortic arch. The branch vessels of the aortic arch appear patent throughout their imaged courses. There is a minimal amount of atherosclerotic plaque involving the aortic arch, not resulting in a hemodynamically significant stenosis. Although this examination was not tailored for the evaluation the pulmonary arteries, there are no discrete filling defects within the central pulmonary arterial tree to suggest central pulmonary embolism. Borderline enlarged caliber of the main pulmonary artery measuring 32 mm in diameter. Mediastinum/Nodes: Mildly prominent right hilar and mediastinal lymph nodes are grossly unchanged compared to the 04/2019 cardiac CT with index right infrahilar lymph node measuring approximately 1.3 cm in greatest short axis diameter (image 40, series 2) and precarinal lymph node measuring 0.9 cm (image 27, series 2, similar to chest CT 03/2020, nonspecific though presumably reactive in etiology. No bulky mediastinal or axillary lymphadenopathy. Lungs/Pleura: Minimal dependent subpleural ground-glass atelectasis. Subsegmental atelectasis involving the right lower lobe and basilar segment of the lingula. No discrete focal airspace opacities. No pleural effusion or pneumothorax. The central pulmonary airways appear widely patent. No discrete pulmonary nodules. Musculoskeletal: No acute or aggressive osseous abnormalities. Regional soft tissues  appear normal. Normal appearance of the thyroid gland. _________________________________________________________ CT ABDOMEN PELVIS FINDINGS Hepatobiliary: Normal hepatic contour. There is a minimal amount of focal fatty infiltration adjacent to the fissure for ligamentum teres. No discrete hepatic lesions. Normal appearance of the gallbladder given degree of distention. No radiopaque gallstones. Suspected minimal amount of periportal edema without definitive intra or extrahepatic biliary duct dilatation. No ascites. Pancreas: Sequela of distal pancreatectomy with unchanged approximately 2.8 x 1.5 cm fluid collection at the operative site (image 61, series 2), previously, 3.1 x 1.9 cm. Questioned 2.0 cm cystic lesion within the uncinate process of the pancreas is suboptimally evaluated on the present examination (image 67, series 2). No peripancreatic stranding. Spleen: Normal appearance of the spleen. Adrenals/Urinary Tract: There is symmetric enhancement of the bilateral kidneys. No definite evidence of nephrolithiasis on this postcontrast examination. Subcentimeter right-sided renal lesions are too small to adequately characterize though favored to represent renal cysts. No discrete left-sided renal lesions. No urine obstruction or perinephric stranding. Normal appearance the bilateral adrenal glands. Normal appearance of the urinary bladder given degree of distention. Stomach/Bowel: Large colonic stool burden without evidence of enteric obstruction. Feculent material is seen within the distal small bowel.  The normal appearance of the retrocecal appendix. Scattered colonic diverticulosis without evidence superimposed acute diverticulitis. No discrete areas of bowel wall thickening. No definitive hiatal hernia. No pneumoperitoneum, pneumatosis or portal venous gas. Vascular/Lymphatic: Minimal amount of atherosclerotic plaque within a normal caliber abdominal aorta. The major branch vessels of the abdominal aorta  appear patent on this non CTA examination. Redemonstrated postoperative occlusion the SMV the with several suspected gastric varices, suboptimally evaluated secondary to phase of enhancement. No bulky retroperitoneal, mesenteric, pelvic or inguinal lymphadenopathy. Reproductive: Post hysterectomy. No discrete adnexal lesions. No free fluid the pelvic cul-de-sac. Other: Tiny mesenteric fat containing periumbilical hernia. Musculoskeletal: No acute or aggressive osseous abnormalities. Moderate to severe multilevel lumbar spine DDD, worse at L2-L3 and L5-S1 with disc space height loss, endplate irregularity and sclerosis. Note is made of a right-sided L5-S1 assimilation joint. Mild scoliotic curvature of the thoracolumbar spine with dominant caudal component convex the left, potentially degenerative in etiology. IMPRESSION: 1. No acute traumatic findings within the chest, abdomen or pelvis. 2. Stable sequela of distal pancreatectomy with unchanged approximately 2.8 x 1.6 cm fluid collection adjacent to the operative site, nonspecific though likely representative of a non enlarging postprocedural pseudocyst. 3. Postoperative ligation of the splenic vein with several small gastric varices, suboptimally evaluated secondary to phase of enhancement. If there is clinical concern for gastric variceal hemorrhage, further evaluation with endoscopy could be performed as indicated. 4. Previously questioned 1.9 cm minimally complex cystic lesion within the uncinate process of the pancreas is suboptimally evaluated on this non pancreatic protocol examination. Follow-up MRI in July of this year was recommended on previous diagnostic CT performed 08/12/2020. 5. Suspected periportal edema. Correlation with LFTs is advised. 6. Large colonic stool burden with fecalization of the distal small bowel as could be seen in the setting of constipation. 7. Colonic diverticulosis without evidence of acute diverticulitis. Critical Value/emergent  results were called by telephone at the time of interpretation on 12/16/2020 at 4:39 pm to provider [facility] , who verbally acknowledged these results. Electronically Signed   By: Sandi Mariscal M.D.   On: 12/16/2020 16:41   CT T-SPINE NO CHARGE  Result Date: 12/16/2020 CLINICAL DATA:  [place]. EXAM: CT THORACIC AND LUMBAR SPINE WITHOUT CONTRAST TECHNIQUE: Multidetector CT imaging of the thoracic and lumbar spine was performed without contrast. Multiplanar CT image reconstructions were also generated. COMPARISON:  None. FINDINGS: CT THORACIC SPINE FINDINGS Alignment: Normal Vertebrae: Intact. No compression fracture. No bone lesion. The facets are normally aligned. No facet or laminar fractures or pedicle fractures. Paraspinal and other soft tissues: No significant paraspinal findings. Disc levels: No large thoracic disc protrusions or spinal stenosis. CT LUMBAR SPINE FINDINGS Segmentation: There are five lumbar type vertebral bodies. The last full intervertebral disc space is labeled L5-S1. Alignment: Left convex scoliosis and associated moderate degenerative lumbar spondylosis with multilevel disc disease and facet disease, most significant at L2-3 and L5-S1. Vertebrae: No acute lumbar spine fracture. The vertebral bodies are maintained. Advanced facet disease but no pars defects or acute pars fractures. Paraspinal and other soft tissues: No significant paraspinal or retroperitoneal findings. Disc levels: No large lumbar disc protrusions, significant spinal or foraminal stenosis. IMPRESSION: 1. Normal alignment of the thoracic and lumbar vertebral bodies and no acute fracture. 2. Left convex scoliosis and associated moderate degenerative lumbar spondylosis with multilevel disc disease and facet disease, most significant at L2-3 and L5-S1. 3. No large disc protrusions, significant spinal or foraminal stenosis. Electronically Signed   By: Ricky Stabs.D.  On: 12/16/2020 16:05   CT L-SPINE NO CHARGE  Result  Date: 12/16/2020 CLINICAL DATA:  [place]. EXAM: CT THORACIC AND LUMBAR SPINE WITHOUT CONTRAST TECHNIQUE: Multidetector CT imaging of the thoracic and lumbar spine was performed without contrast. Multiplanar CT image reconstructions were also generated. COMPARISON:  None. FINDINGS: CT THORACIC SPINE FINDINGS Alignment: Normal Vertebrae: Intact. No compression fracture. No bone lesion. The facets are normally aligned. No facet or laminar fractures or pedicle fractures. Paraspinal and other soft tissues: No significant paraspinal findings. Disc levels: No large thoracic disc protrusions or spinal stenosis. CT LUMBAR SPINE FINDINGS Segmentation: There are five lumbar type vertebral bodies. The last full intervertebral disc space is labeled L5-S1. Alignment: Left convex scoliosis and associated moderate degenerative lumbar spondylosis with multilevel disc disease and facet disease, most significant at L2-3 and L5-S1. Vertebrae: No acute lumbar spine fracture. The vertebral bodies are maintained. Advanced facet disease but no pars defects or acute pars fractures. Paraspinal and other soft tissues: No significant paraspinal or retroperitoneal findings. Disc levels: No large lumbar disc protrusions, significant spinal or foraminal stenosis. IMPRESSION: 1. Normal alignment of the thoracic and lumbar vertebral bodies and no acute fracture. 2. Left convex scoliosis and associated moderate degenerative lumbar spondylosis with multilevel disc disease and facet disease, most significant at L2-3 and L5-S1. 3. No large disc protrusions, significant spinal or foraminal stenosis. Electronically Signed   By: Marijo Sanes M.D.   On: 12/16/2020 16:05   DG Foot 2 Views Left  Result Date: 12/16/2020 CLINICAL DATA:  Status post fall. EXAM: LEFT FOOT - 2 VIEW COMPARISON:  None. FINDINGS: A small fracture deformity of indeterminate age is seen involving the lateral aspect of the base of the first left metatarsal. There is no evidence of  arthropathy or other focal bone abnormality. Mild soft tissue swelling is seen along the lateral aspect of the left ankle. IMPRESSION: Small fracture of the first left metatarsal of indeterminate age. CT correlation is recommended. Electronically Signed   By: Virgina Norfolk M.D.   On: 12/16/2020 17:24   DG Foot 2 Views Right  Result Date: 12/16/2020 CLINICAL DATA:  Right foot pain and swelling after multiple falls. EXAM: RIGHT FOOT - 2 VIEW COMPARISON:  None. FINDINGS: Distal right tibial fracture is noted. No other bony abnormality is noted. There is no evidence of arthropathy or other focal bone abnormality. Soft tissues are unremarkable. IMPRESSION: Mildly displaced distal right tibial fracture. Electronically Signed   By: Marijo Conception M.D.   On: 12/16/2020 16:38    ____________________________________________   PROCEDURES  Procedure(s) performed (including Critical Care):  .1-3 Lead EKG Interpretation Performed by: Lucrezia Starch, MD Authorized by: Lucrezia Starch, MD     Interpretation: abnormal     ECG rate assessment: bradycardic     Rhythm: atrial fibrillation     Ectopy: none       ____________________________________________   INITIAL IMPRESSION / ASSESSMENT AND PLAN / ED COURSE        Patient presents with above-stated history and exam for assessment of several falls that occurred today in setting of some cough and lightheadedness.  Patient is on Eliquis and think she took it this morning.  She also think she hit her head but on arrival only complains of pain in her right ankle.  Per EMS she was hypotensive on their arrival and received 500 cc of normal saline.  On arrival to emergency room she is borderline hypotensive with BP of 95/50 with a pulse  rate of 52 with otherwise stable vital signs on room air.  Unclear etiology for syncope if this was secondary to preceding low blood pressure as she cannot recall clear mechanical mechanism.  With regard to the  etiology for low blood pressure differential includes cardiogenic i.e. ACS arrhythmia PE, acute infectious process, CVA, and endocrine derangements.  She is also on diltiazem and this could also have dropped her blood pressure and heart rate.  Given low blood pressure with her being on Eliquis and reportedly hitting her head  full trauma pan scans ordered.  In addition given injuries described at the right ankle and some tenderness of the left foot plain films of right ankle and left foot obtained.  She is otherwise neurovascular intact in her extremities.  No clear acute intracranial process skull fracture or bleed on CT head.  CT C-spine shows no clear acute intracranial process.  No evidence of acute injury on CT scan of the chest abdomen pelvis as well as reformats of the T and L-spine.  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  There is also a small cystic lesion at the uncinate process.  There is evidence of diverticulosis without evidence of diverticulitis.  Plain film left foot is unremarkable.  Plain film the right ankle shows a displaced distal tibia fracture and distal fibular fracture.  Entire left foot shows small left first metatarsal nondisplaced fracture.  Suspect this is acute as she has been transferred in this area.  CBC shows no leukocytosis and hemoglobin of 11.4 which seems to be close to patient's baseline.  Platelets are normal.  CMP shows BUN of 40 and a glucose of 130 without any other significant electrode or metabolic derangements.  Opponent is nonelevated and overall suspicion for ACS.  No evidence on CT chest of any acute infectious process large PE dissection aneurysm pericardial effusion or other clear acute process.  No other acute infectious process or other obvious source of her blood pressure and CT of abdomen pelvis.  Magnesium is WNL.  Lactic acid slightly elevated 2.1.  Right lower leg splinted  with a posterior leg splint.  Generally she requires emergent Ortho consult this evening and she is stable for them to be consulted tomorrow in the hospital.  Discussed my concerns for symptomatic bradycardia with on-call cardiologist Dr. Who agreed with not giving patient atropine at this time and continued monitoring overnight with plan for likely medication adjustment tomorrow.  We will also order an MRI as patient is little bit confused and not oriented to date.  She also endorses some difficulty walking straight earlier today and CVA is still within the differential.  I will plan to admit to hospitalist service for further evaluation and management.  ____________________________________________   FINAL CLINICAL IMPRESSION(S) / ED DIAGNOSES  Final diagnoses:  Fall  Closed fracture of right ankle, initial encounter  Bradycardia  Dehydration  Longstanding persistent atrial fibrillation ([name])  Anticoagulated  Closed nondisplaced fracture of fifth metatarsal bone of left foot, initial encounter    Medications  lactated ringers bolus 1,000 mL (1,000 mLs Intravenous New Bag/Given 12/16/20 1609)  iohexol (OMNIPAQUE) 300 MG/ML solution 100 mL (75 mLs Intravenous Contrast Given 12/16/20 1527)  acetaminophen (TYLENOL) tablet 1,000 mg (1,000 mg Oral Given 12/16/20 1805)     ED Discharge Orders    None       Note:  This document was prepared using Dragon voice recognition software and may  include unintentional dictation errors.   Lucrezia Starch, MD 12/16/20 1727    Lucrezia Starch, MD 12/16/20 1806    Lucrezia Starch, MD 12/16/20 1812    Lucrezia Starch, MD 12/16/20 [id number]

## 2020-12-16 NOTE — H&P (Signed)
Chief Complaint: I fell at home today.  I have pain in my right leg.  HPI:  Patient is a poor historian.  Patient was forgetful of events that occurred at home. Monica Whitaker is an 75 y.o. female with prior history of CVA, restless leg syndrome, chronic atrial fibrillation on anticoagulation, hypothyroidism, anxiety disorder.  At baseline, patient lives at home by self.  She is ADL and IADL independent.  She has been in her usual state of health had an episode of fall at home today.  She was unable to recall events preceding the fall.  She reports not being able to get up after the event.  She has had preceding falls prior to this last event.  She was unable to recall if she hit her head.  She denied any associated focal weakness but admits to difficulty recalling events.  She complained of pain in her right ankle joint.  EMS was called and as per EMS, she was reported to be hypotensive at the time of evaluation.  She responded to 500 cc of normal saline.  No reported incontinence of urine or stool.  Work-up in the ED was significant for right tibial fracture and metatarsal fracture.    Past Medical History:  Diagnosis Date  . Actinic keratosis 12/20/2012   left nasal tip  . Anxiety    controlled with meds  . Anxiety   . Arrhythmia   . Atrial fibrillation ([name])    paroxysmal, failed medical therapy with flecainide,  NSVT with tikosyn  . Basal cell carcinoma 12/20/2012   left proximal mandible  . Bruises easily   . CVA (cerebral vascular accident) ([place]) 12/2007  . Depression    medically controlled   . Glaucoma    also with macular holes AE Dr. Thomasene Ripple  . History of loop recorder   . Hypothyroidism   . Low blood pressure    no falls, but if gets up to fast  . RLS (restless legs syndrome)   . Stroke [facility]) 7 years ago   Lasting effects on balance, different personality.  . Thyroid disease   . Tremor   . Ventricular tachycardia ([place])    pt told at [facility] that she had RVOT VT     Past Surgical History:  Procedure Laterality Date  . ATRIAL FIBRILLATION ABLATION  10/18/12   PVI by Dr Rayann Heman  . ATRIAL FIBRILLATION ABLATION N/A 10/18/2012   Procedure: ATRIAL FIBRILLATION ABLATION;  Surgeon: Thompson Grayer, MD;  Location: [facility] CATH LAB;  Service: Cardiovascular;  Laterality: N/A;  . ATRIAL FIBRILLATION ABLATION N/A 04/18/2019   Procedure: ATRIAL FIBRILLATION ABLATION;  Surgeon: Thompson Grayer, MD;  Location: [place] CV LAB;  Service: Cardiovascular;  Laterality: N/A;  . EUS N/A 10/19/2019   Procedure: FULL UPPER ENDOSCOPIC ULTRASOUND (EUS) RADIAL;  Surgeon: Holly Bodily, MD;  Location: [facility] ENDOSCOPY;  Service: Gastroenterology;  Laterality: N/A;  . EYE SURGERY     on L/R eye, macular hole   . FRACTURE SURGERY    . implantable loop recorder placement  07/13/2019   MDT Reveal LINQ1 [facility] [id number]) implanted in office by Dr Rayann Heman for afib management post ablation  . OPEN REDUCTION INTERNAL FIXATION (ORIF) DISTAL RADIAL FRACTURE Right 11/15/2014   Procedure: OPEN REDUCTION INTERNAL FIXATION (ORIF) RIGHT DISTAL RADIAL FRACTURE WITH ALLOGRAFT BONE GRAFT;  Surgeon: Roseanne Kaufman, MD;  Location: [place];  Service: Orthopedics;  Laterality: Right;  . PANCREATECTOMY N/A 02/13/2020   Procedure: LAPAROSCOPIC DISTAL PANCREATECTOMY;  Surgeon:  Stark Klein, MD;  Location: [place];  Service: General;  Laterality: N/A;  . TEE WITHOUT CARDIOVERSION N/A 10/18/2012   Procedure: TRANSESOPHAGEAL ECHOCARDIOGRAM (TEE);  Surgeon: Lelon Perla, MD;  Location: [facility] ENDOSCOPY;  Service: Cardiovascular;  Laterality: N/A;  Pre-Ablation at 12pm  . TONSILLECTOMY    . VAGINAL HYSTERECTOMY      Family History  Problem Relation Age of Onset  . Multiple sclerosis Father   . Breast cancer Neg Hx    Social History:  reports that she has never smoked. She has never used smokeless tobacco. She reports current alcohol use of about 2.0 standard drinks of alcohol per week. She  reports that she does not use drugs.  Allergies:  Allergies  Allergen Reactions  . Darvon Other (See Comments)    Severe panic attacks  . Propoxyphene Other (See Comments)    GI Upset Severe panic attacks GI Upset  . Propoxyphene N-Acetaminophen Other (See Comments)    Severe panic attacks  . Levofloxacin Anxiety and Other (See Comments)    Causes panic attacks.    (Not in a hospital admission)   Results for orders placed or performed during the hospital encounter of 12/16/20 (from the past 48 hour(s))  Resp Panel by RT-PCR (Flu A&B, Covid) Nasopharyngeal Swab     Status: None   Collection Time: 12/16/20  3:19 PM   Specimen: Nasopharyngeal Swab; Nasopharyngeal(NP) swabs in vial transport medium  Result Value Ref Range   SARS Coronavirus 2 by RT PCR NEGATIVE NEGATIVE    Comment: (NOTE) SARS-CoV-2 target nucleic acids are NOT DETECTED.  The SARS-CoV-2 RNA is generally detectable in upper respiratory specimens during the acute phase of infection. The lowest concentration of SARS-CoV-2 viral copies this assay can detect is 138 copies/mL. A negative result does not preclude SARS-Cov-2 infection and should not be used as the sole basis for treatment or other patient management decisions. A negative result may occur with  improper specimen collection/handling, submission of specimen other than nasopharyngeal swab, presence of viral mutation(s) within the areas targeted by this assay, and inadequate number of viral copies(<138 copies/mL). A negative result must be combined with clinical observations, patient history, and epidemiological information. The expected result is Negative.  Fact Sheet for Patients:  EntrepreneurPulse.com.au  Fact Sheet for Healthcare Providers:  IncredibleEmployment.be  This test is no t yet approved or cleared by the [place] FDA and  has been authorized for detection and/or diagnosis of SARS-CoV-2 by FDA  under an Emergency Use Authorization (EUA). This EUA will remain  in effect (meaning this test can be used) for the duration of the COVID-19 declaration under Section 564(b)(1) of the Act, 21 U.S.C.section 360bbb-3(b)(1), unless the authorization is terminated  or revoked sooner.       Influenza A by PCR NEGATIVE NEGATIVE   Influenza B by PCR NEGATIVE NEGATIVE    Comment: (NOTE) The Xpert Xpress SARS-CoV-2/FLU/RSV plus assay is intended as an aid in the diagnosis of influenza from Nasopharyngeal swab specimens and should not be used as a sole basis for treatment. Nasal washings and aspirates are unacceptable for Xpert Xpress SARS-CoV-2/FLU/RSV testing.  Fact Sheet for Patients: EntrepreneurPulse.com.au  Fact Sheet for Healthcare Providers: IncredibleEmployment.be  This test is not yet approved or cleared by the [place] FDA and has been authorized for detection and/or diagnosis of SARS-CoV-2 by FDA under an Emergency Use Authorization (EUA). This EUA will remain in effect (meaning this test can be used) for the duration of the COVID-19 declaration  under Section 564(b)(1) of the Act, 21 U.S.C. section 360bbb-3(b)(1), unless the authorization is terminated or revoked.  Performed at [facility], [address] [place], [place] [address]   CBC with Differential     Status: Abnormal   Collection Time: 12/16/20  3:19 PM  Result Value Ref Range   WBC 9.5 4.0 - 10.5 K/uL   RBC 3.80 (L) 3.87 - 5.11 MIL/uL   Hemoglobin 11.4 (L) 12.0 - 15.0 g/dL   HCT 34.6 (L) 36.0 - 46.0 %   MCV 91.1 80.0 - 100.0 fL   MCH 30.0 26.0 - 34.0 pg   MCHC 32.9 30.0 - 36.0 g/dL   RDW 13.9 11.5 - 15.5 %   Platelets 258 150 - 400 K/uL   nRBC 0.0 0.0 - 0.2 %   Neutrophils Relative % 72 %   Neutro Abs 6.8 1.7 - 7.7 K/uL   Lymphocytes Relative 17 %   Lymphs Abs 1.7 0.7 - 4.0 K/uL   Monocytes Relative 9 %   Monocytes Absolute 0.9 0.1 - 1.0 K/uL    Eosinophils Relative 1 %   Eosinophils Absolute 0.1 0.0 - 0.5 K/uL   Basophils Relative 1 %   Basophils Absolute 0.1 0.0 - 0.1 K/uL   Immature Granulocytes 0 %   Abs Immature Granulocytes 0.04 0.00 - 0.07 K/uL    Comment: Performed at [facility], [address] [place], [place] [address]  Comprehensive metabolic panel     Status: Abnormal   Collection Time: 12/16/20  3:19 PM  Result Value Ref Range   Sodium 136 135 - 145 mmol/L   Potassium 4.6 3.5 - 5.1 mmol/L   Chloride 104 98 - 111 mmol/L   CO2 23 22 - 32 mmol/L   Glucose, Bld 130 (H) 70 - 99 mg/dL    Comment: Glucose reference range applies only to samples taken after fasting for at least 8 hours.   BUN 40 (H) 8 - 23 mg/dL   Creatinine, Ser 0.94 0.44 - 1.00 mg/dL   Calcium 9.0 8.9 - 10.3 mg/dL   Total Protein 6.4 (L) 6.5 - 8.1 g/dL   Albumin 3.7 3.5 - 5.0 g/dL   AST 31 15 - 41 U/L   ALT 26 0 - 44 U/L   Alkaline Phosphatase 52 38 - 126 U/L   Total Bilirubin 0.7 0.3 - 1.2 mg/dL   GFR, Estimated >60 >60 mL/min    Comment: (NOTE) Calculated using the CKD-EPI Creatinine Equation (2021)    Anion gap 9 5 - 15    Comment: Performed at [facility], [address] [place], [place] [address]  Protime-INR     Status: Abnormal   Collection Time: 12/16/20  3:19 PM  Result Value Ref Range   Prothrombin Time 16.9 (H) 11.4 - 15.2 seconds   INR 1.4 (H) 0.8 - 1.2    Comment: (NOTE) INR goal varies based on device and disease states. Performed at [facility], [place], [place] [address]   Troponin I (High Sensitivity)     Status: None   Collection Time: 12/16/20  3:19 PM  Result Value Ref Range   Troponin I (High Sensitivity) 4 <18 ng/L    Comment: (NOTE) Elevated high sensitivity troponin I (hsTnI) values and significant  changes across serial measurements may suggest ACS but many other  chronic and acute conditions are known to elevate hsTnI results.  Refer to the "Links" section  for chest pain algorithms and additional  guidance. Performed at [facility], [address] [place], [place] [address]   Magnesium     Status: None   Collection Time: 12/16/20  3:19 PM  Result Value Ref Range   Magnesium 2.1 1.7 - 2.4 mg/dL    Comment: Performed at [facility], [address] [place], [place] [address]  Lactic acid, plasma     Status: Abnormal   Collection Time: 12/16/20  3:54 PM  Result Value Ref Range   Lactic Acid, Venous 2.1 (HH) 0.5 - 1.9 mmol/L    Comment: CRITICAL RESULT CALLED TO, READ BACK BY AND VERIFIED WITH KASSIE FELPS @1645  ON 12/16/20 SKL Performed at [facility], [address] [place], [place] [address]    DG Ankle Complete Right  Result Date: 12/16/2020 CLINICAL DATA:  Multiple falls, ankle swelling and pain EXAM: RIGHT ANKLE - COMPLETE 3+ VIEW COMPARISON:  Radiographs from 07/31/2013 FINDINGS: Oblique fracture of the lateral malleolus with extensive overlying subcutaneous edema. No well-defined fracture of the medial or posterior malleolus. Plafond and talar dome otherwise intact. IMPRESSION: 1. Oblique fracture lateral malleolus representing at least a stage II Weber B fracture. No definite medial malleolar or posterior malleolar fragments identified. Electronically Signed   By: Van Clines M.D.   On: 12/16/2020 16:22   CT Head Wo Contrast  Result Date: 12/16/2020 CLINICAL DATA:  Dizziness and subsequent fall. EXAM: CT HEAD WITHOUT CONTRAST TECHNIQUE: Contiguous axial images were obtained from the base of the skull through the vertex without intravenous contrast. COMPARISON:  November 09, 2014 FINDINGS: Brain: There is mild cerebral atrophy with widening of the extra-axial spaces and ventricular dilatation. There are areas of decreased attenuation within the white matter tracts of the supratentorial brain, consistent with microvascular disease changes. An area of encephalomalacia, with adjacent chronic white matter  low attenuation, is seen within the left occipital lobe. Vascular: No hyperdense vessel or unexpected calcification. Skull: Normal. Negative for fracture or focal lesion. Sinuses/Orbits: Mild posterior bilateral ethmoid sinus mucosal thickening is seen. Other: None. IMPRESSION: 1. Generalized cerebral atrophy. 2. Chronic left occipital lobe infarct. 3. No acute intracranial abnormality. Electronically Signed   By: Virgina Norfolk M.D.   On: 12/16/2020 16:18   CT Cervical Spine Wo Contrast  Result Date: 12/16/2020 CLINICAL DATA:  Dizziness, fall.  Dementia. EXAM: CT CERVICAL SPINE WITHOUT CONTRAST TECHNIQUE: Multidetector CT imaging of the cervical spine was performed without intravenous contrast. Multiplanar CT image reconstructions were also generated. COMPARISON:  10/30/2015 FINDINGS: Alignment: 2 mm degenerative retrolisthesis at C4-5. 2.5 mm degenerative anterolisthesis at C7-T1. Skull base and vertebrae: No fracture or acute bony findings identified. Soft tissues and spinal canal: Unremarkable Disc levels: Foraminal stenosis due to uncinate and facet spurring on the right at C3-4 and C5-6; and on the left at C3-4 and C6-7. Suspected disc bulge at C4-5 without significant central narrowing of the thecal sac. Upper chest: Unremarkable Other: No supplemental non-categorized findings. IMPRESSION: 1. No acute cervical spine findings. 2. Cervical spondylosis with mild foraminal impingement at C3-4, C5-6, and C6-7. Electronically Signed   By: Van Clines M.D.   On: 12/16/2020 16:20   CT CHEST ABDOMEN PELVIS W CONTRAST  Result Date: 12/16/2020 CLINICAL DATA:  Post fall. Hypotension. History of partial pancreatectomy for IPMN. EXAM: CT CHEST, ABDOMEN, AND PELVIS WITH CONTRAST TECHNIQUE: Multidetector CT imaging of the chest, abdomen and pelvis was performed following the standard protocol during bolus administration of intravenous contrast. CONTRAST:  56mL OMNIPAQUE IOHEXOL 300 MG/ML  SOLN COMPARISON:  CT abdomen pelvis-08/12/2020; chest CT-03/20/2020; coronary CT-04/13/2019 FINDINGS: CT CHEST FINDINGS Cardiovascular: Normal heart size. No pericardial effusion though a small amount of fluid is seen with the pericardial recess. No evidence of thoracic aortic aneurysm or dissection on this nongated examination. Conventional configuration of the aortic arch. The branch vessels of the aortic arch appear patent throughout their imaged courses. There is a minimal amount of atherosclerotic plaque involving the aortic arch, not resulting in a hemodynamically significant stenosis. Although this examination was not tailored for the evaluation the pulmonary arteries, there are no discrete filling defects within the central pulmonary arterial tree to suggest central pulmonary embolism. Borderline enlarged caliber of the main pulmonary artery measuring 32 mm in diameter. Mediastinum/Nodes: Mildly prominent right hilar and mediastinal lymph nodes are grossly unchanged compared to the 04/2019 cardiac CT with index right infrahilar lymph node measuring approximately 1.3 cm in greatest short axis diameter (image 40, series 2) and precarinal lymph node measuring 0.9 cm (image 27, series 2, similar to chest CT 03/2020, nonspecific though presumably reactive in etiology. No bulky mediastinal or axillary lymphadenopathy. Lungs/Pleura: Minimal dependent subpleural ground-glass atelectasis. Subsegmental atelectasis involving the right lower lobe and basilar segment of the lingula. No discrete focal airspace opacities. No pleural effusion or pneumothorax. The central pulmonary airways appear widely patent. No discrete pulmonary nodules. Musculoskeletal: No acute or aggressive osseous abnormalities. Regional soft tissues appear normal. Normal appearance of the thyroid gland. _________________________________________________________ CT ABDOMEN PELVIS FINDINGS Hepatobiliary: Normal hepatic contour. There is a minimal amount of focal fatty  infiltration adjacent to the fissure for ligamentum teres. No discrete hepatic lesions. Normal appearance of the gallbladder given degree of distention. No radiopaque gallstones. Suspected minimal amount of periportal edema without definitive intra or extrahepatic biliary duct dilatation. No ascites. Pancreas: Sequela of distal pancreatectomy with unchanged approximately 2.8 x 1.5 cm fluid collection at the operative site (image 61, series 2), previously, 3.1 x 1.9 cm. Questioned 2.0 cm cystic lesion within the uncinate process of the pancreas is suboptimally evaluated on the present examination (image 67, series 2). No peripancreatic stranding. Spleen: Normal appearance of the spleen. Adrenals/Urinary Tract: There is symmetric enhancement of the bilateral kidneys. No definite evidence of nephrolithiasis on this postcontrast examination. Subcentimeter right-sided renal lesions are too small to adequately characterize though favored to represent renal cysts. No discrete left-sided renal lesions. No urine obstruction or perinephric stranding. Normal appearance the bilateral adrenal glands. Normal appearance of the urinary bladder given degree of distention. Stomach/Bowel: Large colonic stool burden without evidence of enteric obstruction. Feculent material is seen within the distal small bowel. The normal appearance of the retrocecal appendix. Scattered colonic diverticulosis without evidence superimposed acute diverticulitis. No discrete areas of bowel wall thickening. No definitive hiatal hernia. No pneumoperitoneum, pneumatosis or portal venous gas. Vascular/Lymphatic: Minimal amount of atherosclerotic plaque within a normal caliber abdominal aorta. The major branch vessels of the abdominal aorta appear patent on this non CTA examination. Redemonstrated postoperative occlusion the SMV the with several suspected gastric varices, suboptimally evaluated secondary to phase of enhancement. No bulky retroperitoneal,  mesenteric, pelvic or inguinal lymphadenopathy. Reproductive: Post hysterectomy. No discrete adnexal lesions. No free fluid the pelvic cul-de-sac. Other: Tiny mesenteric fat containing periumbilical hernia. Musculoskeletal: No acute or aggressive osseous abnormalities. Moderate to severe multilevel lumbar spine DDD, worse at L2-L3 and L5-S1 with disc space height loss, endplate irregularity and sclerosis. Note is made of a right-sided L5-S1 assimilation joint. Mild scoliotic curvature of the thoracolumbar spine with dominant caudal component convex the  left, potentially degenerative in etiology. IMPRESSION: 1. No acute traumatic findings within the chest, abdomen or pelvis. 2. Stable sequela of distal pancreatectomy with unchanged approximately 2.8 x 1.6 cm fluid collection adjacent to the operative site, nonspecific though likely representative of a non enlarging postprocedural pseudocyst. 3. Postoperative ligation of the splenic vein with several small gastric varices, suboptimally evaluated secondary to phase of enhancement. If there is clinical concern for gastric variceal hemorrhage, further evaluation with endoscopy could be performed as indicated. 4. Previously questioned 1.9 cm minimally complex cystic lesion within the uncinate process of the pancreas is suboptimally evaluated on this non pancreatic protocol examination. Follow-up MRI in July of this year was recommended on previous diagnostic CT performed 08/12/2020. 5. Suspected periportal edema. Correlation with LFTs is advised. 6. Large colonic stool burden with fecalization of the distal small bowel as could be seen in the setting of constipation. 7. Colonic diverticulosis without evidence of acute diverticulitis. Critical Value/emergent results were called by telephone at the time of interpretation on 12/16/2020 at 4:39 pm to provider [facility] , who verbally acknowledged these results. Electronically Signed   By: Sandi Mariscal M.D.   On: 12/16/2020  16:41   CT T-SPINE NO CHARGE  Result Date: 12/16/2020 CLINICAL DATA:  [place]. EXAM: CT THORACIC AND LUMBAR SPINE WITHOUT CONTRAST TECHNIQUE: Multidetector CT imaging of the thoracic and lumbar spine was performed without contrast. Multiplanar CT image reconstructions were also generated. COMPARISON:  None. FINDINGS: CT THORACIC SPINE FINDINGS Alignment: Normal Vertebrae: Intact. No compression fracture. No bone lesion. The facets are normally aligned. No facet or laminar fractures or pedicle fractures. Paraspinal and other soft tissues: No significant paraspinal findings. Disc levels: No large thoracic disc protrusions or spinal stenosis. CT LUMBAR SPINE FINDINGS Segmentation: There are five lumbar type vertebral bodies. The last full intervertebral disc space is labeled L5-S1. Alignment: Left convex scoliosis and associated moderate degenerative lumbar spondylosis with multilevel disc disease and facet disease, most significant at L2-3 and L5-S1. Vertebrae: No acute lumbar spine fracture. The vertebral bodies are maintained. Advanced facet disease but no pars defects or acute pars fractures. Paraspinal and other soft tissues: No significant paraspinal or retroperitoneal findings. Disc levels: No large lumbar disc protrusions, significant spinal or foraminal stenosis. IMPRESSION: 1. Normal alignment of the thoracic and lumbar vertebral bodies and no acute fracture. 2. Left convex scoliosis and associated moderate degenerative lumbar spondylosis with multilevel disc disease and facet disease, most significant at L2-3 and L5-S1. 3. No large disc protrusions, significant spinal or foraminal stenosis. Electronically Signed   By: Marijo Sanes M.D.   On: 12/16/2020 16:05   CT L-SPINE NO CHARGE  Result Date: 12/16/2020 CLINICAL DATA:  [place]. EXAM: CT THORACIC AND LUMBAR SPINE WITHOUT CONTRAST TECHNIQUE: Multidetector CT imaging of the thoracic and lumbar spine was performed without contrast. Multiplanar CT image  reconstructions were also generated. COMPARISON:  None. FINDINGS: CT THORACIC SPINE FINDINGS Alignment: Normal Vertebrae: Intact. No compression fracture. No bone lesion. The facets are normally aligned. No facet or laminar fractures or pedicle fractures. Paraspinal and other soft tissues: No significant paraspinal findings. Disc levels: No large thoracic disc protrusions or spinal stenosis. CT LUMBAR SPINE FINDINGS Segmentation: There are five lumbar type vertebral bodies. The last full intervertebral disc space is labeled L5-S1. Alignment: Left convex scoliosis and associated moderate degenerative lumbar spondylosis with multilevel disc disease and facet disease, most significant at L2-3 and L5-S1. Vertebrae: No acute lumbar spine fracture. The vertebral bodies are maintained. Advanced facet disease  but no pars defects or acute pars fractures. Paraspinal and other soft tissues: No significant paraspinal or retroperitoneal findings. Disc levels: No large lumbar disc protrusions, significant spinal or foraminal stenosis. IMPRESSION: 1. Normal alignment of the thoracic and lumbar vertebral bodies and no acute fracture. 2. Left convex scoliosis and associated moderate degenerative lumbar spondylosis with multilevel disc disease and facet disease, most significant at L2-3 and L5-S1. 3. No large disc protrusions, significant spinal or foraminal stenosis. Electronically Signed   By: Marijo Sanes M.D.   On: 12/16/2020 16:05   DG Foot 2 Views Left  Result Date: 12/16/2020 CLINICAL DATA:  Status post fall. EXAM: LEFT FOOT - 2 VIEW COMPARISON:  None. FINDINGS: A small fracture deformity of indeterminate age is seen involving the lateral aspect of the base of the first left metatarsal. There is no evidence of arthropathy or other focal bone abnormality. Mild soft tissue swelling is seen along the lateral aspect of the left ankle. IMPRESSION: Small fracture of the first left metatarsal of indeterminate age. CT correlation  is recommended. Electronically Signed   By: Virgina Norfolk M.D.   On: 12/16/2020 17:24   DG Foot 2 Views Right  Result Date: 12/16/2020 CLINICAL DATA:  Right foot pain and swelling after multiple falls. EXAM: RIGHT FOOT - 2 VIEW COMPARISON:  None. FINDINGS: Distal right tibial fracture is noted. No other bony abnormality is noted. There is no evidence of arthropathy or other focal bone abnormality. Soft tissues are unremarkable. IMPRESSION: Mildly displaced distal right tibial fracture. Electronically Signed   By: Marijo Conception M.D.   On: 12/16/2020 16:38    Review of Systems  Constitutional: Negative.   HENT: Negative.   Eyes: Negative.   Respiratory: Negative.   Cardiovascular: Negative.   Gastrointestinal: Negative.   Genitourinary: Negative.   Musculoskeletal: Negative.   Neurological: Negative.   Psychiatric/Behavioral: Negative.     Blood pressure 100/82, pulse (!) 52, temperature 98.1 F (36.7 C), temperature source Oral, resp. rate 15, height 5\' 9"  (1.753 m), weight 72.6 kg, SpO2 98 %. Physical Exam Vitals and nursing note reviewed.  HENT:     Head: Normocephalic.  Pulmonary:     Effort: Pulmonary effort is normal.     Breath sounds: Normal breath sounds.  Abdominal:     Palpations: Abdomen is soft.  Musculoskeletal:        General: Tenderness and deformity present. Normal range of motion.     Comments: RLE with splint/ dressing inplace  Skin:    General: Skin is warm.  Neurological:     General: No focal deficit present.     Mental Status: She is alert and oriented to person, place, and time.  Psychiatric:        Behavior: Behavior normal.      Assessment/Plan  Acute syncope, etiology unclear likely vasovagal versus related to bradycardia.  Patient heart rate remained stable in the 50s at this time.  EKG shows A. fib with slow ventricular rate.  At baseline, patient is also on Eliquis which was held in anticipation for Ortho evaluation of joint fracture.   We will schedule for orthostasis in a.m.  Right distal tibia fracture: Mildly displaced.  Orthopedics consulted and will eval in a.m.  Nonweightbearing status at this time.  Ortho to determine if patient may benefit from surgical intervention versus not.  Pain management at this time with acetaminophen only.  Incidental finding of left first metatarsal fracture.  Atrial fibrillation with slow ventricular rate: Patient  is on flecainide and Cardizem for rate and rhythm control.  Due to questionable hypotension and bradycardia arrhythmia, will hold Cardizem for now.  Cardiology to evaluate in a.m. and advise further management.Keep on telemetry for monitoring.  History of CVA with possible underlying vascular dementia.  Known history history of essential tremor.  Frequent falls at home: PT and OT to assess for gait stability.  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  Previously noted with plans for an MRI requested previously.  Patient will need to follow-up as outpatient.  Constipation: Trial of stool softeners will be given.  Lactulose  Insomnia, depression and anxiety:     [place], MD 12/16/2020, 7:47 PM

## 2020-12-16 NOTE — Plan of Care (Signed)
Patient will remain free from infection by washing hands

## 2020-12-16 NOTE — ED Notes (Signed)
Lab advising Lactic 2.1, Smith MD made aware.

## 2020-12-16 NOTE — ED Triage Notes (Signed)
Pt arrives via ems from independent living at [facility]. Staff who check on pt reported to ems that she is undiagnosed dementia, pt states that she has been having some dizziness and when she fell and was crawling on the floor she twisted her right ankle, ems reports that she had her bags packed to travel to [employer]. Pt was found to have a bp of 94/50, 20g started to the left forearm and 500 ml's of ns administered pta.

## 2020-12-17 ENCOUNTER — Inpatient Hospital Stay (HOSPITAL_COMMUNITY)
Admit: 2020-12-17 | Discharge: 2020-12-17 | Disposition: A | Discharge status: Home / Self Care | Payer: PPO | Attending: Physician Assistant | Admitting: Physician Assistant

## 2020-12-17 ENCOUNTER — Encounter: Payer: Self-pay | Admitting: Internal Medicine

## 2020-12-17 DIAGNOSIS — R55 Syncope and collapse: Secondary | ICD-10-CM

## 2020-12-17 LAB — URINALYSIS, COMPLETE (UACMP) WITH MICROSCOPIC
Bilirubin Urine: NEGATIVE
Glucose, UA: NEGATIVE mg/dL
Hgb urine dipstick: NEGATIVE
Ketones, ur: NEGATIVE mg/dL
Nitrite: NEGATIVE
Protein, ur: NEGATIVE mg/dL
Specific Gravity, Urine: 1.018 (ref 1.005–1.030)
pH: 6 (ref 5.0–8.0)

## 2020-12-17 LAB — CBC
HCT: 33.4 % — ABNORMAL LOW (ref 36.0–46.0)
Hemoglobin: 11.1 g/dL — ABNORMAL LOW (ref 12.0–15.0)
MCH: 30.2 pg (ref 26.0–34.0)
MCHC: 33.2 g/dL (ref 30.0–36.0)
MCV: 91 fL (ref 80.0–100.0)
Platelets: 222 10*3/uL (ref 150–400)
RBC: 3.67 MIL/uL — ABNORMAL LOW (ref 3.87–5.11)
RDW: 14.2 % (ref 11.5–15.5)
WBC: 5.6 10*3/uL (ref 4.0–10.5)
nRBC: 0 % (ref 0.0–0.2)

## 2020-12-17 LAB — BASIC METABOLIC PANEL
Anion gap: 7 (ref 5–15)
BUN: 22 mg/dL (ref 8–23)
CO2: 26 mmol/L (ref 22–32)
Calcium: 8.3 mg/dL — ABNORMAL LOW (ref 8.9–10.3)
Chloride: 105 mmol/L (ref 98–111)
Creatinine, Ser: 0.56 mg/dL (ref 0.44–1.00)
GFR, Estimated: >60 mL/min (ref >60)
Glucose, Bld: 105 mg/dL — ABNORMAL HIGH (ref 70–99)
Potassium: 3.7 mmol/L (ref 3.5–5.1)
Sodium: 138 mmol/L (ref 135–145)

## 2020-12-17 MED ORDER — APIXABAN 5 MG PO TABS
5.0000 mg | ORAL_TABLET | Freq: Two times a day (BID) | ORAL | Status: DC
  Administered 2020-12-17 – 2020-12-19 (×5): 5 mg via ORAL
  Filled 2020-12-17 (×5): qty 1

## 2020-12-17 MED ORDER — TRAMADOL HCL 50 MG PO TABS
50.0000 mg | ORAL_TABLET | Freq: Four times a day (QID) | ORAL | Status: DC | PRN
  Administered 2020-12-17 – 2020-12-19 (×4): 50 mg via ORAL
  Filled 2020-12-17 (×4): qty 1

## 2020-12-17 MED ORDER — GABAPENTIN 100 MG PO CAPS
200.0000 mg | ORAL_CAPSULE | Freq: Two times a day (BID) | ORAL | Status: DC
  Administered 2020-12-17 – 2020-12-19 (×4): 200 mg via ORAL
  Filled 2020-12-17 (×4): qty 2

## 2020-12-17 MED ORDER — ADULT MULTIVITAMIN W/MINERALS CH
1.0000 | ORAL_TABLET | Freq: Every day | ORAL | Status: DC
  Administered 2020-12-17 – 2020-12-19 (×3): 1 via ORAL
  Filled 2020-12-17 (×3): qty 1

## 2020-12-17 MED ORDER — LORAZEPAM 0.5 MG PO TABS
0.5000 mg | ORAL_TABLET | Freq: Three times a day (TID) | ORAL | Status: DC | PRN
  Administered 2020-12-18 – 2020-12-19 (×3): 0.5 mg via ORAL
  Filled 2020-12-17 (×3): qty 1

## 2020-12-17 NOTE — Consult Note (Signed)
ORTHOPAEDIC CONSULTATION  REQUESTING PHYSICIAN: Lorella Nimrod, MD  Chief Complaint: right ankle pain  HPI: Monica Whitaker is a 75 y.o. female who complains of right ankle pain. The pain is sharp in character. The pain is severe and 10/10. The pain is worse with movement and better with rest. Denies any numbness, tingling or constitutional symptoms.  Past Medical History:  Diagnosis Date  . Actinic keratosis 12/20/2012   left nasal tip  . Anxiety    controlled with meds  . Anxiety   . Arrhythmia   . Atrial fibrillation ([name])    paroxysmal, failed medical therapy with flecainide,  NSVT with tikosyn  . Basal cell carcinoma 12/20/2012   left proximal mandible  . Bruises easily   . CVA (cerebral vascular accident) ([place]) 12/2007  . Depression    medically controlled   . Glaucoma    also with macular holes AE Dr. Thomasene Ripple  . History of loop recorder   . Hypothyroidism   . Low blood pressure    no falls, but if gets up to fast  . RLS (restless legs syndrome)   . Stroke [facility]) 7 years ago   Lasting effects on balance, different personality.  . Thyroid disease   . Tremor   . Ventricular tachycardia ([place])    pt told at [facility] that she had RVOT VT   Past Surgical History:  Procedure Laterality Date  . ATRIAL FIBRILLATION ABLATION  10/18/12   PVI by Dr Rayann Heman  . ATRIAL FIBRILLATION ABLATION N/A 10/18/2012   Procedure: ATRIAL FIBRILLATION ABLATION;  Surgeon: Thompson Grayer, MD;  Location: [facility] CATH LAB;  Service: Cardiovascular;  Laterality: N/A;  . ATRIAL FIBRILLATION ABLATION N/A 04/18/2019   Procedure: ATRIAL FIBRILLATION ABLATION;  Surgeon: Thompson Grayer, MD;  Location: [place] CV LAB;  Service: Cardiovascular;  Laterality: N/A;  . EUS N/A 10/19/2019   Procedure: FULL UPPER ENDOSCOPIC ULTRASOUND (EUS) RADIAL;  Surgeon: Holly Bodily, MD;  Location: [facility] ENDOSCOPY;  Service: Gastroenterology;  Laterality: N/A;  . EYE SURGERY     on L/R eye, macular hole   . FRACTURE  SURGERY    . implantable loop recorder placement  07/13/2019   MDT Reveal LINQ1 [facility] [id number]) implanted in office by Dr Rayann Heman for afib management post ablation  . OPEN REDUCTION INTERNAL FIXATION (ORIF) DISTAL RADIAL FRACTURE Right 11/15/2014   Procedure: OPEN REDUCTION INTERNAL FIXATION (ORIF) RIGHT DISTAL RADIAL FRACTURE WITH ALLOGRAFT BONE GRAFT;  Surgeon: Roseanne Kaufman, MD;  Location: [place];  Service: Orthopedics;  Laterality: Right;  . PANCREATECTOMY N/A 02/13/2020   Procedure: LAPAROSCOPIC DISTAL PANCREATECTOMY;  Surgeon: Stark Klein, MD;  Location: [place];  Service: General;  Laterality: N/A;  . TEE WITHOUT CARDIOVERSION N/A 10/18/2012   Procedure: TRANSESOPHAGEAL ECHOCARDIOGRAM (TEE);  Surgeon: Lelon Perla, MD;  Location: [facility] ENDOSCOPY;  Service: Cardiovascular;  Laterality: N/A;  Pre-Ablation at 12pm  . TONSILLECTOMY    . VAGINAL HYSTERECTOMY     Social History   Socioeconomic History  . Marital status: Widowed    Spouse name: Not on file  . Number of children: 0  . Years of education: Not on file  . Highest education level: Not on file  Occupational History  . Occupation: Retired    Fish farm manager: RETIRED  Tobacco Use  . Smoking status: Never Smoker  . Smokeless tobacco: Never Used  Vaping Use  . Vaping Use: Never used  Substance and Sexual Activity  . Alcohol use: Yes    Alcohol/week: 2.0 standard  drinks    Types: 2 Standard drinks or equivalent per week    Comment: regular  . Drug use: No  . Sexual activity: Yes  Other Topics Concern  . Not on file  Social History Narrative   Lives in [place] with her husband.  No children 2 step kids.  Former Psychologist, prison and probation services   Enjoys exercise- water classes, yoga Editor, commissioning.    Widowed 2019    Only child and mother was only child   Used to sail with husband      Has a living will-    Would desire CPR   Would not want prolonged life support if futile.      Has 1 cat lives alone no kids but see  above    Social Determinants of Health   Financial Resource Strain: Not on file  Food Insecurity: Not on file  Transportation Needs: Not on file  Physical Activity: Not on file  Stress: Not on file  Social Connections: Not on file   Family History  Problem Relation Age of Onset  . Multiple sclerosis Father   . Breast cancer Neg Hx    Allergies  Allergen Reactions  . Darvon Other (See Comments)    Severe panic attacks  . Propoxyphene Other (See Comments)    GI Upset Severe panic attacks GI Upset  . Propoxyphene N-Acetaminophen Other (See Comments)    Severe panic attacks  . Levofloxacin Anxiety and Other (See Comments)    Causes panic attacks.   Prior to Admission medications   Medication Sig Start Date End Date Taking? Authorizing Provider  acetaminophen (TYLENOL) 500 MG tablet Take 1,000 mg by mouth every 6 (six) hours as needed for mild pain or moderate pain.   Yes [provider]  amphetamine-dextroamphetamine (ADDERALL XR) 10 MG 24 hr capsule Take 1 capsule (10 mg total) by mouth daily. 12/09/20  Yes Cottle, Billey Co., MD  apixaban (ELIQUIS) 5 MG TABS tablet Take 1 tablet (5 mg total) by mouth in the morning and at bedtime. 07/03/20  Yes Allred, Jeneen Rinks, MD  B Complex-C (B-COMPLEX WITH VITAMIN C) tablet Take 1 tablet by mouth daily.   Yes [provider]  blood glucose meter kit and supplies KIT Dispense based on patient and insurance preference. Use up to four times daily as directed. (FOR ICD-9 250.00, 250.01). 02/16/20  Yes Stark Klein, MD  Calcium Carb-Cholecalciferol (CALCIUM 600+D3 PO) Take 2 tablets by mouth daily.   Yes [provider]  Cholecalciferol (VITAMIN D3) 50 MCG (2000 UT) TABS Take 4,000 Units by mouth daily.   Yes [provider]  CRANBERRY EXTRACT PO Take 2 tablets by mouth every evening.    Yes [provider]  denosumab (PROLIA) 60 MG/ML SOLN injection Inject 60 mg into the skin every 6 (six) months.    Yes  [provider]  diltiazem (CARDIZEM CD) 240 MG 24 hr capsule Take 1 capsule (240 mg total) by mouth daily. May take extra capsule daily as needed for afib. Please make overdue appt with Dr. Rayann Heman before anymore refills. Thank you 2nd attempt 12/06/20  Yes Allred, Jeneen Rinks, MD  DULoxetine (CYMBALTA) 60 MG capsule Take 1 capsule (60 mg total) by mouth 2 (two) times daily. Patient taking differently: Take 120 mg by mouth daily. 09/09/20  Yes Cottle, Billey Co., MD  estradiol (ESTRACE VAGINAL) 0.1 MG/GM vaginal cream Apply 0.6m (pea-sized amount)  just inside the vaginal introitus with a finger-tip on Monday, Wednesday and  Friday nights. Patient taking differently: Apply 0.82m (pea-sized amount)  just inside the vaginal introitus with a finger-tip on Monday, Wednesday and Friday nights. 05/25/18  Yes McGowan, SLarene BeachA, PA-C  flecainide (TAMBOCOR) 100 MG tablet TAKE 1 TABLET (100 MG TOTAL) BY MOUTH IN THE MORNING AND AT BEDTIME. 11/19/20  Yes TEvans Lance MD  gabapentin (NEURONTIN) 100 MG capsule Take 2 capsules (200 mg total) by mouth 2 (two) times daily. 09/22/19  Yes McLean-Scocuzza, TNino Glow MD  levothyroxine (SYNTHROID) 25 MCG tablet Take 1 tablet (25 mcg total) by mouth daily before breakfast. 30 minutes before food 06/06/20  Yes McLean-Scocuzza, TNino Glow MD  LORazepam (ATIVAN) 0.5 MG tablet TAKE 1 TABLET (0.5 MG TOTAL) BY MOUTH EVERY 8 (EIGHT) HOURS. Patient taking differently: Take 0.5 mg by mouth every 8 (eight) hours as needed for anxiety. 10/17/20  Yes Cottle, CBilley Co, MD  melatonin 5 MG TABS Take 2.5-5 mg by mouth at bedtime as needed (sleep).   Yes [provider]  Multiple Vitamin (MULTIVITAMIN WITH MINERALS) TABS tablet Take 1 tablet by mouth daily.   Yes [provider]  pravastatin (PRAVACHOL) 10 MG tablet Take 1 tablet (10 mg total) by mouth at bedtime. Take a whole pill 06/06/20  Yes McLean-Scocuzza, TNino Glow MD  SALT SUBSTITUTES PO Take 1 tablet by mouth daily  as needed (hypotension).   Yes [provider]  timolol (TIMOPTIC) 0.5 % ophthalmic solution Place 1 drop into the left eye at bedtime.    Yes [provider]  vitamin C (ASCORBIC ACID) 500 MG tablet Take 1,000 mg by mouth daily.    Yes [provider]  vitamin E 400 UNIT capsule Take 400 Units by mouth daily.     Yes [provider]  methocarbamol (ROBAXIN) 500 MG tablet Take 1 tablet (500 mg total) by mouth every 6 (six) hours as needed for muscle spasms. Patient not taking: No sig reported 02/16/20   BStark Klein MD  ondansetron (ZOFRAN-ODT) 4 MG disintegrating tablet Take 1 tablet (4 mg total) by mouth every 6 (six) hours as needed for nausea. Patient not taking: No sig reported 02/16/20   BStark Klein MD  oxyCODONE (OXY IR/ROXICODONE) 5 MG immediate release tablet Take 1-2 tablets (5-10 mg total) by mouth every 4 (four) hours as needed for moderate pain, severe pain or breakthrough pain. Patient not taking: No sig reported 02/16/20   BStark Klein MD  traMADol (ULTRAM) 50 MG tablet Take 1 tablet (50 mg total) by mouth every 6 (six) hours as needed (mild pain). Patient not taking: No sig reported 02/16/20   BStark Klein MD   DG Ankle Complete Right  Result Date: 12/16/2020 CLINICAL DATA:  Multiple falls, ankle swelling and pain EXAM: RIGHT ANKLE - COMPLETE 3+ VIEW COMPARISON:  Radiographs from 07/31/2013 FINDINGS: Oblique fracture of the lateral malleolus with extensive overlying subcutaneous edema. No well-defined fracture of the medial or posterior malleolus. Plafond and talar dome otherwise intact. IMPRESSION: 1. Oblique fracture lateral malleolus representing at least a stage II Weber B fracture. No definite medial malleolar or posterior malleolar fragments identified. Electronically Signed   By: WVan ClinesM.D.   On: 12/16/2020 16:22   CT Head Wo Contrast  Result Date: 12/16/2020 CLINICAL DATA:  Dizziness and subsequent fall. EXAM: CT HEAD  WITHOUT CONTRAST TECHNIQUE: Contiguous axial images were obtained from the base of the skull through the vertex without intravenous contrast. COMPARISON:  November 09, 2014 FINDINGS: Brain: There is mild cerebral  atrophy with widening of the extra-axial spaces and ventricular dilatation. There are areas of decreased attenuation within the white matter tracts of the supratentorial brain, consistent with microvascular disease changes. An area of encephalomalacia, with adjacent chronic white matter low attenuation, is seen within the left occipital lobe. Vascular: No hyperdense vessel or unexpected calcification. Skull: Normal. Negative for fracture or focal lesion. Sinuses/Orbits: Mild posterior bilateral ethmoid sinus mucosal thickening is seen. Other: None. IMPRESSION: 1. Generalized cerebral atrophy. 2. Chronic left occipital lobe infarct. 3. No acute intracranial abnormality. Electronically Signed   By: Virgina Norfolk M.D.   On: 12/16/2020 16:18   CT Cervical Spine Wo Contrast  Result Date: 12/16/2020 CLINICAL DATA:  Dizziness, fall.  Dementia. EXAM: CT CERVICAL SPINE WITHOUT CONTRAST TECHNIQUE: Multidetector CT imaging of the cervical spine was performed without intravenous contrast. Multiplanar CT image reconstructions were also generated. COMPARISON:  10/30/2015 FINDINGS: Alignment: 2 mm degenerative retrolisthesis at C4-5. 2.5 mm degenerative anterolisthesis at C7-T1. Skull base and vertebrae: No fracture or acute bony findings identified. Soft tissues and spinal canal: Unremarkable Disc levels: Foraminal stenosis due to uncinate and facet spurring on the right at C3-4 and C5-6; and on the left at C3-4 and C6-7. Suspected disc bulge at C4-5 without significant central narrowing of the thecal sac. Upper chest: Unremarkable Other: No supplemental non-categorized findings. IMPRESSION: 1. No acute cervical spine findings. 2. Cervical spondylosis with mild foraminal impingement at C3-4, C5-6, and C6-7.  Electronically Signed   By: Van Clines M.D.   On: 12/16/2020 16:20   MR BRAIN WO CONTRAST  Result Date: 12/16/2020 CLINICAL DATA:  Fall EXAM: MRI HEAD WITHOUT CONTRAST TECHNIQUE: Multiplanar, multiecho pulse sequences of the brain and surrounding structures were obtained without intravenous contrast. COMPARISON:  07/23/2010 FINDINGS: Brain: No acute infarct, mass effect or extra-axial collection. No acute or chronic hemorrhage. There is multifocal hyperintense T2-weighted signal within the white matter. Parenchymal volume and CSF spaces are normal. Old left thalamic small vessel infarct and old left occipital lobe infarct. The midline structures are normal. Vascular: Major flow voids are preserved. Skull and upper cervical spine: Normal calvarium and skull base. Visualized upper cervical spine and soft tissues are normal. Sinuses/Orbits:No paranasal sinus fluid levels or advanced mucosal thickening. No mastoid or middle ear effusion. Normal orbits. IMPRESSION: 1. No acute intracranial abnormality. 2. Old left thalamic small vessel infarct and old left occipital lobe infarct. Electronically Signed   By: Ulyses Jarred M.D.   On: 12/16/2020 19:33   CT CHEST ABDOMEN PELVIS W CONTRAST  Result Date: 12/16/2020 CLINICAL DATA:  Post fall. Hypotension. History of partial pancreatectomy for IPMN. EXAM: CT CHEST, ABDOMEN, AND PELVIS WITH CONTRAST TECHNIQUE: Multidetector CT imaging of the chest, abdomen and pelvis was performed following the standard protocol during bolus administration of intravenous contrast. CONTRAST:  36m OMNIPAQUE IOHEXOL 300 MG/ML  SOLN COMPARISON:  CT abdomen pelvis-08/12/2020; chest CT-03/20/2020; coronary CT-04/13/2019 FINDINGS: CT CHEST FINDINGS Cardiovascular: Normal heart size. No pericardial effusion though a small amount of fluid is seen with the pericardial recess. No evidence of thoracic aortic aneurysm or dissection on this nongated examination. Conventional configuration of  the aortic arch. The branch vessels of the aortic arch appear patent throughout their imaged courses. There is a minimal amount of atherosclerotic plaque involving the aortic arch, not resulting in a hemodynamically significant stenosis. Although this examination was not tailored for the evaluation the pulmonary arteries, there are no discrete filling defects within the central pulmonary arterial tree to suggest central pulmonary embolism.  Borderline enlarged caliber of the main pulmonary artery measuring 32 mm in diameter. Mediastinum/Nodes: Mildly prominent right hilar and mediastinal lymph nodes are grossly unchanged compared to the 04/2019 cardiac CT with index right infrahilar lymph node measuring approximately 1.3 cm in greatest short axis diameter (image 40, series 2) and precarinal lymph node measuring 0.9 cm (image 27, series 2, similar to chest CT 03/2020, nonspecific though presumably reactive in etiology. No bulky mediastinal or axillary lymphadenopathy. Lungs/Pleura: Minimal dependent subpleural ground-glass atelectasis. Subsegmental atelectasis involving the right lower lobe and basilar segment of the lingula. No discrete focal airspace opacities. No pleural effusion or pneumothorax. The central pulmonary airways appear widely patent. No discrete pulmonary nodules. Musculoskeletal: No acute or aggressive osseous abnormalities. Regional soft tissues appear normal. Normal appearance of the thyroid gland. _________________________________________________________ CT ABDOMEN PELVIS FINDINGS Hepatobiliary: Normal hepatic contour. There is a minimal amount of focal fatty infiltration adjacent to the fissure for ligamentum teres. No discrete hepatic lesions. Normal appearance of the gallbladder given degree of distention. No radiopaque gallstones. Suspected minimal amount of periportal edema without definitive intra or extrahepatic biliary duct dilatation. No ascites. Pancreas: Sequela of distal pancreatectomy  with unchanged approximately 2.8 x 1.5 cm fluid collection at the operative site (image 61, series 2), previously, 3.1 x 1.9 cm. Questioned 2.0 cm cystic lesion within the uncinate process of the pancreas is suboptimally evaluated on the present examination (image 67, series 2). No peripancreatic stranding. Spleen: Normal appearance of the spleen. Adrenals/Urinary Tract: There is symmetric enhancement of the bilateral kidneys. No definite evidence of nephrolithiasis on this postcontrast examination. Subcentimeter right-sided renal lesions are too small to adequately characterize though favored to represent renal cysts. No discrete left-sided renal lesions. No urine obstruction or perinephric stranding. Normal appearance the bilateral adrenal glands. Normal appearance of the urinary bladder given degree of distention. Stomach/Bowel: Large colonic stool burden without evidence of enteric obstruction. Feculent material is seen within the distal small bowel. The normal appearance of the retrocecal appendix. Scattered colonic diverticulosis without evidence superimposed acute diverticulitis. No discrete areas of bowel wall thickening. No definitive hiatal hernia. No pneumoperitoneum, pneumatosis or portal venous gas. Vascular/Lymphatic: Minimal amount of atherosclerotic plaque within a normal caliber abdominal aorta. The major branch vessels of the abdominal aorta appear patent on this non CTA examination. Redemonstrated postoperative occlusion the SMV the with several suspected gastric varices, suboptimally evaluated secondary to phase of enhancement. No bulky retroperitoneal, mesenteric, pelvic or inguinal lymphadenopathy. Reproductive: Post hysterectomy. No discrete adnexal lesions. No free fluid the pelvic cul-de-sac. Other: Tiny mesenteric fat containing periumbilical hernia. Musculoskeletal: No acute or aggressive osseous abnormalities. Moderate to severe multilevel lumbar spine DDD, worse at L2-L3 and L5-S1 with  disc space height loss, endplate irregularity and sclerosis. Note is made of a right-sided L5-S1 assimilation joint. Mild scoliotic curvature of the thoracolumbar spine with dominant caudal component convex the left, potentially degenerative in etiology. IMPRESSION: 1. No acute traumatic findings within the chest, abdomen or pelvis. 2. Stable sequela of distal pancreatectomy with unchanged approximately 2.8 x 1.6 cm fluid collection adjacent to the operative site, nonspecific though likely representative of a non enlarging postprocedural pseudocyst. 3. Postoperative ligation of the splenic vein with several small gastric varices, suboptimally evaluated secondary to phase of enhancement. If there is clinical concern for gastric variceal hemorrhage, further evaluation with endoscopy could be performed as indicated. 4. Previously questioned 1.9 cm minimally complex cystic lesion within the uncinate process of the pancreas is suboptimally evaluated on this non pancreatic protocol examination.  Follow-up MRI in July of this year was recommended on previous diagnostic CT performed 08/12/2020. 5. Suspected periportal edema. Correlation with LFTs is advised. 6. Large colonic stool burden with fecalization of the distal small bowel as could be seen in the setting of constipation. 7. Colonic diverticulosis without evidence of acute diverticulitis. Critical Value/emergent results were called by telephone at the time of interpretation on 12/16/2020 at 4:39 pm to provider [facility] , who verbally acknowledged these results. Electronically Signed   By: Sandi Mariscal M.D.   On: 12/16/2020 16:41   CT T-SPINE NO CHARGE  Result Date: 12/16/2020 CLINICAL DATA:  [place]. EXAM: CT THORACIC AND LUMBAR SPINE WITHOUT CONTRAST TECHNIQUE: Multidetector CT imaging of the thoracic and lumbar spine was performed without contrast. Multiplanar CT image reconstructions were also generated. COMPARISON:  None. FINDINGS: CT THORACIC SPINE FINDINGS  Alignment: Normal Vertebrae: Intact. No compression fracture. No bone lesion. The facets are normally aligned. No facet or laminar fractures or pedicle fractures. Paraspinal and other soft tissues: No significant paraspinal findings. Disc levels: No large thoracic disc protrusions or spinal stenosis. CT LUMBAR SPINE FINDINGS Segmentation: There are five lumbar type vertebral bodies. The last full intervertebral disc space is labeled L5-S1. Alignment: Left convex scoliosis and associated moderate degenerative lumbar spondylosis with multilevel disc disease and facet disease, most significant at L2-3 and L5-S1. Vertebrae: No acute lumbar spine fracture. The vertebral bodies are maintained. Advanced facet disease but no pars defects or acute pars fractures. Paraspinal and other soft tissues: No significant paraspinal or retroperitoneal findings. Disc levels: No large lumbar disc protrusions, significant spinal or foraminal stenosis. IMPRESSION: 1. Normal alignment of the thoracic and lumbar vertebral bodies and no acute fracture. 2. Left convex scoliosis and associated moderate degenerative lumbar spondylosis with multilevel disc disease and facet disease, most significant at L2-3 and L5-S1. 3. No large disc protrusions, significant spinal or foraminal stenosis. Electronically Signed   By: Marijo Sanes M.D.   On: 12/16/2020 16:05   CT L-SPINE NO CHARGE  Result Date: 12/16/2020 CLINICAL DATA:  [place]. EXAM: CT THORACIC AND LUMBAR SPINE WITHOUT CONTRAST TECHNIQUE: Multidetector CT imaging of the thoracic and lumbar spine was performed without contrast. Multiplanar CT image reconstructions were also generated. COMPARISON:  None. FINDINGS: CT THORACIC SPINE FINDINGS Alignment: Normal Vertebrae: Intact. No compression fracture. No bone lesion. The facets are normally aligned. No facet or laminar fractures or pedicle fractures. Paraspinal and other soft tissues: No significant paraspinal findings. Disc levels: No large  thoracic disc protrusions or spinal stenosis. CT LUMBAR SPINE FINDINGS Segmentation: There are five lumbar type vertebral bodies. The last full intervertebral disc space is labeled L5-S1. Alignment: Left convex scoliosis and associated moderate degenerative lumbar spondylosis with multilevel disc disease and facet disease, most significant at L2-3 and L5-S1. Vertebrae: No acute lumbar spine fracture. The vertebral bodies are maintained. Advanced facet disease but no pars defects or acute pars fractures. Paraspinal and other soft tissues: No significant paraspinal or retroperitoneal findings. Disc levels: No large lumbar disc protrusions, significant spinal or foraminal stenosis. IMPRESSION: 1. Normal alignment of the thoracic and lumbar vertebral bodies and no acute fracture. 2. Left convex scoliosis and associated moderate degenerative lumbar spondylosis with multilevel disc disease and facet disease, most significant at L2-3 and L5-S1. 3. No large disc protrusions, significant spinal or foraminal stenosis. Electronically Signed   By: Marijo Sanes M.D.   On: 12/16/2020 16:05   DG Foot 2 Views Left  Result Date: 12/16/2020 CLINICAL DATA:  Status post  fall. EXAM: LEFT FOOT - 2 VIEW COMPARISON:  None. FINDINGS: A small fracture deformity of indeterminate age is seen involving the lateral aspect of the base of the first left metatarsal. There is no evidence of arthropathy or other focal bone abnormality. Mild soft tissue swelling is seen along the lateral aspect of the left ankle. IMPRESSION: Small fracture of the first left metatarsal of indeterminate age. CT correlation is recommended. Electronically Signed   By: Virgina Norfolk M.D.   On: 12/16/2020 17:24   DG Foot 2 Views Right  Result Date: 12/16/2020 CLINICAL DATA:  Right foot pain and swelling after multiple falls. EXAM: RIGHT FOOT - 2 VIEW COMPARISON:  None. FINDINGS: Distal right tibial fracture is noted. No other bony abnormality is noted. There is  no evidence of arthropathy or other focal bone abnormality. Soft tissues are unremarkable. IMPRESSION: Mildly displaced distal right tibial fracture. Electronically Signed   By: Marijo Conception M.D.   On: 12/16/2020 16:38    Positive ROS: All other systems have been reviewed and were otherwise negative with the exception of those mentioned in the HPI and as above.  Physical Exam: General: Alert, no acute distress Cardiovascular: No pedal edema Respiratory: No cyanosis, no use of accessory musculature GI: No organomegaly, abdomen is soft and non-tender Skin: No lesions in the area of chief complaint Neurologic: Sensation intact distally Psychiatric: Patient is competent for consent with normal mood and affect Lymphatic: No axillary or cervical lymphadenopathy  MUSCULOSKELETAL: right ankle with lateral tenderness, no medial tenderness. Compartments soft. Good cap refill. Motor and sensory intact distally.  Assessment: Right fibula fracture  Plan: Plan non-operative treatment. She may be TDWB on the right and WBAT on the left. Will continue to follow. The metatarsal fracture appears to be old based on the x-ray and exam    Lovell Sheehan, MD    12/17/2020 11:41 AM

## 2020-12-17 NOTE — Progress Notes (Signed)
PROGRESS NOTE    Monica Whitaker  UUV:[id number] DOB: 12/17/1945 DOA: 12/16/2020 PCP: McLean-Scocuzza, Nino Glow, MD   Brief Narrative: Taken from H&P. Monica Whitaker is an 75 y.o. female with prior history of CVA, restless leg syndrome, chronic atrial fibrillation on anticoagulation, hypothyroidism, anxiety disorder.  At baseline, patient lives at home by self.  She is ADL and IADL independent.  She was brought to ER after having a fall at home.  Resulted in right tibial and metatarsal fracture.  Orthopedic was consulted and they are recommending conservative management and think that metatarsal fracture is chronic. Patient was also found to be hypotensive which responded well to 500 cc of normal saline.  Subjective: Patient was complaining of right ankle pain.  A friend who is also her POA was in room.  Apparently she does not has any family around.  She could not remember why she fell.  Assessment & Plan:   Active Problems:   Ankle fracture  Right distal tibial fracture secondary to fall.  Most likely mechanical versus syncopal episode has not been ruled out yet.  Orthopedic was consulted and they are recommending conservative management.  Patient do have an history of prior falls. -We will ask orthopedic regarding restarting Eliquis if they do not want any procedure. -Continue with pain management -PT/OT evaluation for disposition.  Atrial fibrillation with slow ventricular rate.  Patient has an history of atrial fibrillation and takes flecainide and Cardizem at home.  She was found to have bradycardia and hypotensive on arrival which can be contributory to her fall.  Cardiology was consulted-will appreciate their recommendations. -Keep holding Cardizem for now. -Restart Eliquis as orthopedic is just recommending conservative management.  History of pancreatic cancer.  S/p pancreatectomy.  Had an history of intraductal papillary mucinous neoplasm. -Will need outpatient  follow-up  Constipation. -Continue with bowel regimen  History of CVA. -Continue with home statin  History of anxiety/depression.  No acute concern. -Continue with home meds which include Cymbalta and as needed Ativan.  Objective: Vitals:   12/16/20 1830 12/16/20 2044 12/17/20 0414 12/17/20 0805  BP: 100/82 (!) 112/57 (!) 98/51 (!) 97/51  Pulse:  60 65 69  Resp: 15 18  16   Temp:   98.3 F (36.8 C) 98.6 F (37 C)  TempSrc:   Oral Oral  SpO2:  94% 94% 96%  Weight:      Height:        Intake/Output Summary (Last 24 hours) at 12/17/2020 1306 Last data filed at 12/17/2020 1200 Gross per 24 hour  Intake --  Output 1550 ml  Net -1550 ml   Filed Weights   12/16/20 1514  Weight: 72.6 kg    Examination:  General exam: Frail elderly lady, appears calm and comfortable  Respiratory system: Clear to auscultation. Respiratory effort normal. Cardiovascular system: S1 & S2 heard, RRR. No JVD, murmurs, rubs, gallops or clicks. Gastrointestinal system: Soft, nontender, nondistended, bowel sounds positive. Central nervous system: Alert and oriented. No focal neurological deficits. Extremities: No edema, no cyanosis, pulses intact and symmetrical. Psychiatry: Judgement and insight appear normal.   DVT prophylaxis: SCDs, Eliquis Code Status: DNR Family Communication: A friend who is also her POA was updated at bedside. Disposition Plan:  Status is: Inpatient  Remains inpatient appropriate because:Inpatient level of care appropriate due to severity of illness   Dispo: The patient is from: Home              Anticipated d/c is to: To be determined  Patient currently is not medically stable to d/c.   Difficult to place patient No             Level of care: Med-Surg  All the records are reviewed and case discussed with Care Management/Social Worker. Management plans discussed with the patient, nursing and they are in agreement.  Consultants:   Orthopedic  surgery  Cardiology  Procedures:  Antimicrobials:   Data Reviewed: I have personally reviewed following labs and imaging studies  CBC: Recent Labs  Lab 12/16/20 1519 12/17/20 0620  WBC 9.5 5.6  NEUTROABS 6.8  --   HGB 11.4* 11.1*  HCT 34.6* 33.4*  MCV 91.1 91.0  PLT 258 259   Basic Metabolic Panel: Recent Labs  Lab 12/16/20 1519 12/17/20 0620  NA 136 138  K 4.6 3.7  CL 104 105  CO2 23 26  GLUCOSE 130* 105*  BUN 40* 22  CREATININE 0.94 0.56  CALCIUM 9.0 8.3*  MG 2.1  --    GFR: Estimated Creatinine Clearance: 65.5 mL/min (by C-G formula based on SCr of 0.56 mg/dL). Liver Function Tests: Recent Labs  Lab 12/16/20 1519  AST 31  ALT 26  ALKPHOS 52  BILITOT 0.7  PROT 6.4*  ALBUMIN 3.7   No results for input(s): LIPASE, AMYLASE in the last 168 hours. No results for input(s): AMMONIA in the last 168 hours. Coagulation Profile: Recent Labs  Lab 12/16/20 1519  INR 1.4*   Cardiac Enzymes: No results for input(s): CKTOTAL, CKMB, CKMBINDEX, TROPONINI in the last 168 hours. BNP (last 3 results) No results for input(s): PROBNP in the last 8760 hours. HbA1C: No results for input(s): HGBA1C in the last 72 hours. CBG: No results for input(s): GLUCAP in the last 168 hours. Lipid Profile: No results for input(s): CHOL, HDL, LDLCALC, TRIG, CHOLHDL, LDLDIRECT in the last 72 hours. Thyroid Function Tests: No results for input(s): TSH, T4TOTAL, FREET4, T3FREE, THYROIDAB in the last 72 hours. Anemia Panel: No results for input(s): VITAMINB12, FOLATE, FERRITIN, TIBC, IRON, RETICCTPCT in the last 72 hours. Sepsis Labs: Recent Labs  Lab 12/16/20 1554 12/16/20 2027  LATICACIDVEN 2.1* 1.1    Recent Results (from the past 240 hour(s))  Resp Panel by RT-PCR (Flu A&B, Covid) Nasopharyngeal Swab     Status: None   Collection Time: 12/16/20  3:19 PM   Specimen: Nasopharyngeal Swab; Nasopharyngeal(NP) swabs in vial transport medium  Result Value Ref Range Status    SARS Coronavirus 2 by RT PCR NEGATIVE NEGATIVE Final    Comment: (NOTE) SARS-CoV-2 target nucleic acids are NOT DETECTED.  The SARS-CoV-2 RNA is generally detectable in upper respiratory specimens during the acute phase of infection. The lowest concentration of SARS-CoV-2 viral copies this assay can detect is 138 copies/mL. A negative result does not preclude SARS-Cov-2 infection and should not be used as the sole basis for treatment or other patient management decisions. A negative result may occur with  improper specimen collection/handling, submission of specimen other than nasopharyngeal swab, presence of viral mutation(s) within the areas targeted by this assay, and inadequate number of viral copies(<138 copies/mL). A negative result must be combined with clinical observations, patient history, and epidemiological information. The expected result is Negative.  Fact Sheet for Patients:  EntrepreneurPulse.com.au  Fact Sheet for Healthcare Providers:  IncredibleEmployment.be  This test is no t yet approved or cleared by the [place] FDA and  has been authorized for detection and/or diagnosis of SARS-CoV-2 by FDA under an Emergency Use Authorization (EUA). This EUA  will remain  in effect (meaning this test can be used) for the duration of the COVID-19 declaration under Section 564(b)(1) of the Act, 21 U.S.C.section 360bbb-3(b)(1), unless the authorization is terminated  or revoked sooner.       Influenza A by PCR NEGATIVE NEGATIVE Final   Influenza B by PCR NEGATIVE NEGATIVE Final    Comment: (NOTE) The Xpert Xpress SARS-CoV-2/FLU/RSV plus assay is intended as an aid in the diagnosis of influenza from Nasopharyngeal swab specimens and should not be used as a sole basis for treatment. Nasal washings and aspirates are unacceptable for Xpert Xpress SARS-CoV-2/FLU/RSV testing.  Fact Sheet for  Patients: EntrepreneurPulse.com.au  Fact Sheet for Healthcare Providers: IncredibleEmployment.be  This test is not yet approved or cleared by the [place] FDA and has been authorized for detection and/or diagnosis of SARS-CoV-2 by FDA under an Emergency Use Authorization (EUA). This EUA will remain in effect (meaning this test can be used) for the duration of the COVID-19 declaration under Section 564(b)(1) of the Act, 21 U.S.C. section 360bbb-3(b)(1), unless the authorization is terminated or revoked.  Performed at [facility], [address], [place], [place] [address]      Radiology Studies: DG Ankle Complete Right  Result Date: 12/16/2020 CLINICAL DATA:  Multiple falls, ankle swelling and pain EXAM: RIGHT ANKLE - COMPLETE 3+ VIEW COMPARISON:  Radiographs from 07/31/2013 FINDINGS: Oblique fracture of the lateral malleolus with extensive overlying subcutaneous edema. No well-defined fracture of the medial or posterior malleolus. Plafond and talar dome otherwise intact. IMPRESSION: 1. Oblique fracture lateral malleolus representing at least a stage II Weber B fracture. No definite medial malleolar or posterior malleolar fragments identified. Electronically Signed   By: Van Clines M.D.   On: 12/16/2020 16:22   CT Head Wo Contrast  Result Date: 12/16/2020 CLINICAL DATA:  Dizziness and subsequent fall. EXAM: CT HEAD WITHOUT CONTRAST TECHNIQUE: Contiguous axial images were obtained from the base of the skull through the vertex without intravenous contrast. COMPARISON:  November 09, 2014 FINDINGS: Brain: There is mild cerebral atrophy with widening of the extra-axial spaces and ventricular dilatation. There are areas of decreased attenuation within the white matter tracts of the supratentorial brain, consistent with microvascular disease changes. An area of encephalomalacia, with adjacent chronic white matter low attenuation, is seen within  the left occipital lobe. Vascular: No hyperdense vessel or unexpected calcification. Skull: Normal. Negative for fracture or focal lesion. Sinuses/Orbits: Mild posterior bilateral ethmoid sinus mucosal thickening is seen. Other: None. IMPRESSION: 1. Generalized cerebral atrophy. 2. Chronic left occipital lobe infarct. 3. No acute intracranial abnormality. Electronically Signed   By: Virgina Norfolk M.D.   On: 12/16/2020 16:18   CT Cervical Spine Wo Contrast  Result Date: 12/16/2020 CLINICAL DATA:  Dizziness, fall.  Dementia. EXAM: CT CERVICAL SPINE WITHOUT CONTRAST TECHNIQUE: Multidetector CT imaging of the cervical spine was performed without intravenous contrast. Multiplanar CT image reconstructions were also generated. COMPARISON:  10/30/2015 FINDINGS: Alignment: 2 mm degenerative retrolisthesis at C4-5. 2.5 mm degenerative anterolisthesis at C7-T1. Skull base and vertebrae: No fracture or acute bony findings identified. Soft tissues and spinal canal: Unremarkable Disc levels: Foraminal stenosis due to uncinate and facet spurring on the right at C3-4 and C5-6; and on the left at C3-4 and C6-7. Suspected disc bulge at C4-5 without significant central narrowing of the thecal sac. Upper chest: Unremarkable Other: No supplemental non-categorized findings. IMPRESSION: 1. No acute cervical spine findings. 2. Cervical spondylosis with mild foraminal impingement at C3-4, C5-6, and  C6-7. Electronically Signed   By: Van Clines M.D.   On: 12/16/2020 16:20   MR BRAIN WO CONTRAST  Result Date: 12/16/2020 CLINICAL DATA:  Fall EXAM: MRI HEAD WITHOUT CONTRAST TECHNIQUE: Multiplanar, multiecho pulse sequences of the brain and surrounding structures were obtained without intravenous contrast. COMPARISON:  07/23/2010 FINDINGS: Brain: No acute infarct, mass effect or extra-axial collection. No acute or chronic hemorrhage. There is multifocal hyperintense T2-weighted signal within the white matter. Parenchymal  volume and CSF spaces are normal. Old left thalamic small vessel infarct and old left occipital lobe infarct. The midline structures are normal. Vascular: Major flow voids are preserved. Skull and upper cervical spine: Normal calvarium and skull base. Visualized upper cervical spine and soft tissues are normal. Sinuses/Orbits:No paranasal sinus fluid levels or advanced mucosal thickening. No mastoid or middle ear effusion. Normal orbits. IMPRESSION: 1. No acute intracranial abnormality. 2. Old left thalamic small vessel infarct and old left occipital lobe infarct. Electronically Signed   By: Ulyses Jarred M.D.   On: 12/16/2020 19:33   CT CHEST ABDOMEN PELVIS W CONTRAST  Result Date: 12/16/2020 CLINICAL DATA:  Post fall. Hypotension. History of partial pancreatectomy for IPMN. EXAM: CT CHEST, ABDOMEN, AND PELVIS WITH CONTRAST TECHNIQUE: Multidetector CT imaging of the chest, abdomen and pelvis was performed following the standard protocol during bolus administration of intravenous contrast. CONTRAST:  33mL OMNIPAQUE IOHEXOL 300 MG/ML  SOLN COMPARISON:  CT abdomen pelvis-08/12/2020; chest CT-03/20/2020; coronary CT-04/13/2019 FINDINGS: CT CHEST FINDINGS Cardiovascular: Normal heart size. No pericardial effusion though a small amount of fluid is seen with the pericardial recess. No evidence of thoracic aortic aneurysm or dissection on this nongated examination. Conventional configuration of the aortic arch. The branch vessels of the aortic arch appear patent throughout their imaged courses. There is a minimal amount of atherosclerotic plaque involving the aortic arch, not resulting in a hemodynamically significant stenosis. Although this examination was not tailored for the evaluation the pulmonary arteries, there are no discrete filling defects within the central pulmonary arterial tree to suggest central pulmonary embolism. Borderline enlarged caliber of the main pulmonary artery measuring 32 mm in diameter.  Mediastinum/Nodes: Mildly prominent right hilar and mediastinal lymph nodes are grossly unchanged compared to the 04/2019 cardiac CT with index right infrahilar lymph node measuring approximately 1.3 cm in greatest short axis diameter (image 40, series 2) and precarinal lymph node measuring 0.9 cm (image 27, series 2, similar to chest CT 03/2020, nonspecific though presumably reactive in etiology. No bulky mediastinal or axillary lymphadenopathy. Lungs/Pleura: Minimal dependent subpleural ground-glass atelectasis. Subsegmental atelectasis involving the right lower lobe and basilar segment of the lingula. No discrete focal airspace opacities. No pleural effusion or pneumothorax. The central pulmonary airways appear widely patent. No discrete pulmonary nodules. Musculoskeletal: No acute or aggressive osseous abnormalities. Regional soft tissues appear normal. Normal appearance of the thyroid gland. _________________________________________________________ CT ABDOMEN PELVIS FINDINGS Hepatobiliary: Normal hepatic contour. There is a minimal amount of focal fatty infiltration adjacent to the fissure for ligamentum teres. No discrete hepatic lesions. Normal appearance of the gallbladder given degree of distention. No radiopaque gallstones. Suspected minimal amount of periportal edema without definitive intra or extrahepatic biliary duct dilatation. No ascites. Pancreas: Sequela of distal pancreatectomy with unchanged approximately 2.8 x 1.5 cm fluid collection at the operative site (image 61, series 2), previously, 3.1 x 1.9 cm. Questioned 2.0 cm cystic lesion within the uncinate process of the pancreas is suboptimally evaluated on the present examination (image 67, series 2). No peripancreatic stranding. Spleen:  Normal appearance of the spleen. Adrenals/Urinary Tract: There is symmetric enhancement of the bilateral kidneys. No definite evidence of nephrolithiasis on this postcontrast examination. Subcentimeter  right-sided renal lesions are too small to adequately characterize though favored to represent renal cysts. No discrete left-sided renal lesions. No urine obstruction or perinephric stranding. Normal appearance the bilateral adrenal glands. Normal appearance of the urinary bladder given degree of distention. Stomach/Bowel: Large colonic stool burden without evidence of enteric obstruction. Feculent material is seen within the distal small bowel. The normal appearance of the retrocecal appendix. Scattered colonic diverticulosis without evidence superimposed acute diverticulitis. No discrete areas of bowel wall thickening. No definitive hiatal hernia. No pneumoperitoneum, pneumatosis or portal venous gas. Vascular/Lymphatic: Minimal amount of atherosclerotic plaque within a normal caliber abdominal aorta. The major branch vessels of the abdominal aorta appear patent on this non CTA examination. Redemonstrated postoperative occlusion the SMV the with several suspected gastric varices, suboptimally evaluated secondary to phase of enhancement. No bulky retroperitoneal, mesenteric, pelvic or inguinal lymphadenopathy. Reproductive: Post hysterectomy. No discrete adnexal lesions. No free fluid the pelvic cul-de-sac. Other: Tiny mesenteric fat containing periumbilical hernia. Musculoskeletal: No acute or aggressive osseous abnormalities. Moderate to severe multilevel lumbar spine DDD, worse at L2-L3 and L5-S1 with disc space height loss, endplate irregularity and sclerosis. Note is made of a right-sided L5-S1 assimilation joint. Mild scoliotic curvature of the thoracolumbar spine with dominant caudal component convex the left, potentially degenerative in etiology. IMPRESSION: 1. No acute traumatic findings within the chest, abdomen or pelvis. 2. Stable sequela of distal pancreatectomy with unchanged approximately 2.8 x 1.6 cm fluid collection adjacent to the operative site, nonspecific though likely representative of a non  enlarging postprocedural pseudocyst. 3. Postoperative ligation of the splenic vein with several small gastric varices, suboptimally evaluated secondary to phase of enhancement. If there is clinical concern for gastric variceal hemorrhage, further evaluation with endoscopy could be performed as indicated. 4. Previously questioned 1.9 cm minimally complex cystic lesion within the uncinate process of the pancreas is suboptimally evaluated on this non pancreatic protocol examination. Follow-up MRI in July of this year was recommended on previous diagnostic CT performed 08/12/2020. 5. Suspected periportal edema. Correlation with LFTs is advised. 6. Large colonic stool burden with fecalization of the distal small bowel as could be seen in the setting of constipation. 7. Colonic diverticulosis without evidence of acute diverticulitis. Critical Value/emergent results were called by telephone at the time of interpretation on 12/16/2020 at 4:39 pm to provider [facility] , who verbally acknowledged these results. Electronically Signed   By: Sandi Mariscal M.D.   On: 12/16/2020 16:41   CT T-SPINE NO CHARGE  Result Date: 12/16/2020 CLINICAL DATA:  [place]. EXAM: CT THORACIC AND LUMBAR SPINE WITHOUT CONTRAST TECHNIQUE: Multidetector CT imaging of the thoracic and lumbar spine was performed without contrast. Multiplanar CT image reconstructions were also generated. COMPARISON:  None. FINDINGS: CT THORACIC SPINE FINDINGS Alignment: Normal Vertebrae: Intact. No compression fracture. No bone lesion. The facets are normally aligned. No facet or laminar fractures or pedicle fractures. Paraspinal and other soft tissues: No significant paraspinal findings. Disc levels: No large thoracic disc protrusions or spinal stenosis. CT LUMBAR SPINE FINDINGS Segmentation: There are five lumbar type vertebral bodies. The last full intervertebral disc space is labeled L5-S1. Alignment: Left convex scoliosis and associated moderate degenerative lumbar  spondylosis with multilevel disc disease and facet disease, most significant at L2-3 and L5-S1. Vertebrae: No acute lumbar spine fracture. The vertebral bodies are maintained. Advanced facet disease but  no pars defects or acute pars fractures. Paraspinal and other soft tissues: No significant paraspinal or retroperitoneal findings. Disc levels: No large lumbar disc protrusions, significant spinal or foraminal stenosis. IMPRESSION: 1. Normal alignment of the thoracic and lumbar vertebral bodies and no acute fracture. 2. Left convex scoliosis and associated moderate degenerative lumbar spondylosis with multilevel disc disease and facet disease, most significant at L2-3 and L5-S1. 3. No large disc protrusions, significant spinal or foraminal stenosis. Electronically Signed   By: Marijo Sanes M.D.   On: 12/16/2020 16:05   CT L-SPINE NO CHARGE  Result Date: 12/16/2020 CLINICAL DATA:  [place]. EXAM: CT THORACIC AND LUMBAR SPINE WITHOUT CONTRAST TECHNIQUE: Multidetector CT imaging of the thoracic and lumbar spine was performed without contrast. Multiplanar CT image reconstructions were also generated. COMPARISON:  None. FINDINGS: CT THORACIC SPINE FINDINGS Alignment: Normal Vertebrae: Intact. No compression fracture. No bone lesion. The facets are normally aligned. No facet or laminar fractures or pedicle fractures. Paraspinal and other soft tissues: No significant paraspinal findings. Disc levels: No large thoracic disc protrusions or spinal stenosis. CT LUMBAR SPINE FINDINGS Segmentation: There are five lumbar type vertebral bodies. The last full intervertebral disc space is labeled L5-S1. Alignment: Left convex scoliosis and associated moderate degenerative lumbar spondylosis with multilevel disc disease and facet disease, most significant at L2-3 and L5-S1. Vertebrae: No acute lumbar spine fracture. The vertebral bodies are maintained. Advanced facet disease but no pars defects or acute pars fractures. Paraspinal and  other soft tissues: No significant paraspinal or retroperitoneal findings. Disc levels: No large lumbar disc protrusions, significant spinal or foraminal stenosis. IMPRESSION: 1. Normal alignment of the thoracic and lumbar vertebral bodies and no acute fracture. 2. Left convex scoliosis and associated moderate degenerative lumbar spondylosis with multilevel disc disease and facet disease, most significant at L2-3 and L5-S1. 3. No large disc protrusions, significant spinal or foraminal stenosis. Electronically Signed   By: Marijo Sanes M.D.   On: 12/16/2020 16:05   DG Foot 2 Views Left  Result Date: 12/16/2020 CLINICAL DATA:  Status post fall. EXAM: LEFT FOOT - 2 VIEW COMPARISON:  None. FINDINGS: A small fracture deformity of indeterminate age is seen involving the lateral aspect of the base of the first left metatarsal. There is no evidence of arthropathy or other focal bone abnormality. Mild soft tissue swelling is seen along the lateral aspect of the left ankle. IMPRESSION: Small fracture of the first left metatarsal of indeterminate age. CT correlation is recommended. Electronically Signed   By: Virgina Norfolk M.D.   On: 12/16/2020 17:24   DG Foot 2 Views Right  Result Date: 12/16/2020 CLINICAL DATA:  Right foot pain and swelling after multiple falls. EXAM: RIGHT FOOT - 2 VIEW COMPARISON:  None. FINDINGS: Distal right tibial fracture is noted. No other bony abnormality is noted. There is no evidence of arthropathy or other focal bone abnormality. Soft tissues are unremarkable. IMPRESSION: Mildly displaced distal right tibial fracture. Electronically Signed   By: Marijo Conception M.D.   On: 12/16/2020 16:38    Scheduled Meds: . DULoxetine  60 mg Oral BID  . flecainide  100 mg Oral Q12H  . levothyroxine  25 mcg Oral QAC breakfast  . pravastatin  10 mg Oral QHS  . timolol  1 drop Left Eye QHS   Continuous Infusions:   LOS: 1 day   Time spent: 35 minutes. More than 50% of the time was spent  in counseling/coordination of care  Lorella Nimrod, MD [facility]  If 7PM-7AM, please contact night-coverage [email or web address]  12/17/2020, 1:06 PM   This record has been created using Systems analyst. Errors have been sought and corrected,but may not always be located. Such creation errors do not reflect on the standard of care.

## 2020-12-17 NOTE — Progress Notes (Signed)
[facility]  Date: 12/17/20 Time: 4:35 PM  Patient's implantable loop recorder (Linq) personally interrogated at the bedside this afternoon.  It shows an episode of paroxysmal atrial fibrillation yesterday morning (12/16/2020) at 7:17 AM lasting 6 minutes with a maximum ventricular rate of 109 bpm and a median rate of 103 bpm.  No significant pause was observed.  No other arrhythmia seen to explain the patient's syncope.  Nelva Bush, MD [facility]

## 2020-12-17 NOTE — Progress Notes (Signed)
OT Cancellation Note  Patient Details Name: Monica Whitaker MRN: [id number] DOB: 1945-10-10   Cancelled Treatment:    Reason Eval/Treat Not Completed: Patient declined, no reason specified. Thank you for the OT consult. Order received and chart reviewed. Upon arrival to pt room, pt endorsing fatigue & 5/10 pain in her RLE, visitors at bedside. Pt politely declines OT evaluation at this time, expresses desire to rest and spend time with her visitors. Pt requests OT return at later time. She is agreeable to evaluation next date. Will continue to follow and initiate OT services at a later date/time as available and pt medically appropriate for evaluation.   Shara Blazing, M.S., OTR/L Ascom: [phone] 12/17/20, 2:03 PM

## 2020-12-17 NOTE — Consult Note (Signed)
Cardiology Consultation:   Patient ID: Monica Whitaker; [id number]; Oct 09, 1945   Admit date: 12/16/2020 Date of Consult: 12/17/2020  Primary Care Provider: McLean-Scocuzza, Nino Glow, MD Primary Cardiologist: Allred Primary Electrophysiologist:  Allred   Patient Profile:   Monica Whitaker is a 75 y.o. 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 Phineas Inches.  History of Present Illness:   Ms. Ohmer underwent A. fib ablation on 10/18/2012.  She is followed by EP in the A. fib clinic.  She is status post ILR implantation to evaluate her A. fib burden to help assist in further medication management.  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  She was in her usual state of health on 5/16 packing to go on vacation to [place] when she noticed that she tripping over her hardwood floor.  She subsequently found herself laying on the floor.  She is uncertain if she had a syncopal episode versus mechanical fall.  She was unable to recall if she hit her head.  She did indicate feeling dizzy leading up to this episode.  She is uncertain how long she was on the ground.  She did note pain in her right ankle.  EMS was called and she was found to be hypotensive in the field with a BP of 94/50 which was treated with IV fluids.  Upon her arrival to the ED BP remained soft in the 90s over 50s with a heart rate in the 50s bpm.  O2 saturations of 98%.  EKG not acute as below.  High-sensitivity troponin negative x2.  Glucose 130.  Hgb low though stable at 11.4.  Renal function normal.  CT head without acute intracranial abnormality with chronic left occipital lobe infarct noted as well as generalized cerebral atrophy.  CT  cervical/thoracic/lumbar spine showed no acute findings.  Plain film of the right ankle showed an oblique fracture lateral malleolus.  Plain film of the right foot showed a small fracture of the first left metatarsal of indeterminate age.  CT chest/abdomen/pelvis showed no acute traumatic findings within the chest/abdomen/pelvis as well as incidental nonacute findings as noted in the radiology report.  MRI of the brain showed no acute intracranial abnormality.  Orthostatic vital signs remained pending.  Her Eliquis and Cardizem were held at time of admission.  She has been evaluated by orthopedics with plans for conservative management and feel the metatarsal fracture is chronic.  In this setting, she has been resumed on Eliquis and continued on flecainide.  She has been maintaining sinus rhythm with heart rate in the 60s bpm on telemetry.  She does not recall the circumstances surrounding her fall.   Past Medical History:  Diagnosis Date  . Actinic keratosis 12/20/2012   left nasal tip  . Anxiety    controlled with meds  . Anxiety   . Arrhythmia   . Atrial fibrillation ([name])    paroxysmal, failed medical therapy with flecainide,  NSVT with tikosyn  . Basal cell carcinoma 12/20/2012   left proximal mandible  . Bruises easily   . CVA (cerebral vascular accident) ([place]) 12/2007  . Depression    medically controlled   . Glaucoma    also with macular holes AE Dr. Thomasene Ripple  .  History of loop recorder   . Hypothyroidism   . Low blood pressure    no falls, but if gets up to fast  . RLS (restless legs syndrome)   . Stroke [facility]) 7 years ago   Lasting effects on balance, different personality.  . Thyroid disease   . Tremor   . Ventricular tachycardia ([place])    pt told at [facility] that she had RVOT VT    Past Surgical History:  Procedure Laterality Date  . ATRIAL FIBRILLATION ABLATION  10/18/12   PVI by Dr Rayann Heman  . ATRIAL FIBRILLATION ABLATION N/A 10/18/2012   Procedure: ATRIAL FIBRILLATION  ABLATION;  Surgeon: Thompson Grayer, MD;  Location: [facility] CATH LAB;  Service: Cardiovascular;  Laterality: N/A;  . ATRIAL FIBRILLATION ABLATION N/A 04/18/2019   Procedure: ATRIAL FIBRILLATION ABLATION;  Surgeon: Thompson Grayer, MD;  Location: [place] CV LAB;  Service: Cardiovascular;  Laterality: N/A;  . EUS N/A 10/19/2019   Procedure: FULL UPPER ENDOSCOPIC ULTRASOUND (EUS) RADIAL;  Surgeon: Holly Bodily, MD;  Location: [facility] ENDOSCOPY;  Service: Gastroenterology;  Laterality: N/A;  . EYE SURGERY     on L/R eye, macular hole   . FRACTURE SURGERY    . implantable loop recorder placement  07/13/2019   MDT Reveal LINQ1 [facility] [id number]) implanted in office by Dr Rayann Heman for afib management post ablation  . OPEN REDUCTION INTERNAL FIXATION (ORIF) DISTAL RADIAL FRACTURE Right 11/15/2014   Procedure: OPEN REDUCTION INTERNAL FIXATION (ORIF) RIGHT DISTAL RADIAL FRACTURE WITH ALLOGRAFT BONE GRAFT;  Surgeon: Roseanne Kaufman, MD;  Location: [place];  Service: Orthopedics;  Laterality: Right;  . PANCREATECTOMY N/A 02/13/2020   Procedure: LAPAROSCOPIC DISTAL PANCREATECTOMY;  Surgeon: Stark Klein, MD;  Location: [place];  Service: General;  Laterality: N/A;  . TEE WITHOUT CARDIOVERSION N/A 10/18/2012   Procedure: TRANSESOPHAGEAL ECHOCARDIOGRAM (TEE);  Surgeon: Lelon Perla, MD;  Location: [facility] ENDOSCOPY;  Service: Cardiovascular;  Laterality: N/A;  Pre-Ablation at 12pm  . TONSILLECTOMY    . VAGINAL HYSTERECTOMY       Home Meds: Prior to Admission medications   Medication Sig Start Date End Date Taking? Authorizing Provider  acetaminophen (TYLENOL) 500 MG tablet Take 1,000 mg by mouth every 6 (six) hours as needed for mild pain or moderate pain.   Yes [provider]  amphetamine-dextroamphetamine (ADDERALL XR) 10 MG 24 hr capsule Take 1 capsule (10 mg total) by mouth daily. 12/09/20  Yes Cottle, Billey Co., MD  apixaban (ELIQUIS) 5 MG TABS tablet Take 1 tablet (5 mg total) by mouth  in the morning and at bedtime. 07/03/20  Yes Allred, Jeneen Rinks, MD  B Complex-C (B-COMPLEX WITH VITAMIN C) tablet Take 1 tablet by mouth daily.   Yes [provider]  blood glucose meter kit and supplies KIT Dispense based on patient and insurance preference. Use up to four times daily as directed. (FOR ICD-9 250.00, 250.01). 02/16/20  Yes Stark Klein, MD  Calcium Carb-Cholecalciferol (CALCIUM 600+D3 PO) Take 2 tablets by mouth daily.   Yes [provider]  Cholecalciferol (VITAMIN D3) 50 MCG (2000 UT) TABS Take 4,000 Units by mouth daily.   Yes [provider]  CRANBERRY EXTRACT PO Take 2 tablets by mouth every evening.    Yes [provider]  denosumab (PROLIA) 60 MG/ML SOLN injection Inject 60 mg into the skin every 6 (six) months.    Yes [provider]  diltiazem (CARDIZEM CD) 240 MG 24 hr capsule Take 1 capsule (240 mg total)  by mouth daily. May take extra capsule daily as needed for afib. Please make overdue appt with Dr. Rayann Heman before anymore refills. Thank you 2nd attempt 12/06/20  Yes Allred, Jeneen Rinks, MD  DULoxetine (CYMBALTA) 60 MG capsule Take 1 capsule (60 mg total) by mouth 2 (two) times daily. Patient taking differently: Take 120 mg by mouth daily. 09/09/20  Yes Cottle, Billey Co., MD  estradiol (ESTRACE VAGINAL) 0.1 MG/GM vaginal cream Apply 0.66m (pea-sized amount)  just inside the vaginal introitus with a finger-tip on Monday, Wednesday and Friday nights. Patient taking differently: Apply 0.579m(pea-sized amount)  just inside the vaginal introitus with a finger-tip on Monday, Wednesday and Friday nights. 05/25/18  Yes McGowan, ShLarene Beach, PA-C  flecainide (TAMBOCOR) 100 MG tablet TAKE 1 TABLET (100 MG TOTAL) BY MOUTH IN THE MORNING AND AT BEDTIME. 11/19/20  Yes TaEvans LanceMD  gabapentin (NEURONTIN) 100 MG capsule Take 2 capsules (200 mg total) by mouth 2 (two) times daily. 09/22/19  Yes McLean-Scocuzza, TrNino GlowMD  levothyroxine (SYNTHROID) 25  MCG tablet Take 1 tablet (25 mcg total) by mouth daily before breakfast. 30 minutes before food 06/06/20  Yes McLean-Scocuzza, TrNino GlowMD  LORazepam (ATIVAN) 0.5 MG tablet TAKE 1 TABLET (0.5 MG TOTAL) BY MOUTH EVERY 8 (EIGHT) HOURS. Patient taking differently: Take 0.5 mg by mouth every 8 (eight) hours as needed for anxiety. 10/17/20  Yes Cottle, CaBilley Co MD  melatonin 5 MG TABS Take 2.5-5 mg by mouth at bedtime as needed (sleep).   Yes [provider]  Multiple Vitamin (MULTIVITAMIN WITH MINERALS) TABS tablet Take 1 tablet by mouth daily.   Yes [provider]  pravastatin (PRAVACHOL) 10 MG tablet Take 1 tablet (10 mg total) by mouth at bedtime. Take a whole pill 06/06/20  Yes McLean-Scocuzza, TrNino GlowMD  SALT SUBSTITUTES PO Take 1 tablet by mouth daily as needed (hypotension).   Yes [provider]  timolol (TIMOPTIC) 0.5 % ophthalmic solution Place 1 drop into the left eye at bedtime.    Yes [provider]  vitamin C (ASCORBIC ACID) 500 MG tablet Take 1,000 mg by mouth daily.    Yes [provider]  vitamin E 400 UNIT capsule Take 400 Units by mouth daily.     Yes [provider]  methocarbamol (ROBAXIN) 500 MG tablet Take 1 tablet (500 mg total) by mouth every 6 (six) hours as needed for muscle spasms. Patient not taking: No sig reported 02/16/20   ByStark KleinMD  ondansetron (ZOFRAN-ODT) 4 MG disintegrating tablet Take 1 tablet (4 mg total) by mouth every 6 (six) hours as needed for nausea. Patient not taking: No sig reported 02/16/20   ByStark KleinMD  oxyCODONE (OXY IR/ROXICODONE) 5 MG immediate release tablet Take 1-2 tablets (5-10 mg total) by mouth every 4 (four) hours as needed for moderate pain, severe pain or breakthrough pain. Patient not taking: No sig reported 02/16/20   ByStark KleinMD  traMADol (ULTRAM) 50 MG tablet Take 1 tablet (50 mg total) by mouth every 6 (six) hours as needed (mild pain). Patient not taking: No sig  reported 02/16/20   ByStark KleinMD    Inpatient Medications: Scheduled Meds: . apixaban  5 mg Oral BID  . DULoxetine  60 mg Oral BID  . flecainide  100 mg Oral Q12H  . gabapentin  200 mg Oral BID  . levothyroxine  25 mcg Oral QAC breakfast  . multivitamin with minerals  1 tablet  Oral Daily  . pravastatin  10 mg Oral QHS  . timolol  1 drop Left Eye QHS   Continuous Infusions:  PRN Meds: acetaminophen, albuterol, amphetamine-dextroamphetamine, LORazepam, senna-docusate, traMADol  Allergies:   Allergies  Allergen Reactions  . Darvon Other (See Comments)    Severe panic attacks  . Propoxyphene Other (See Comments)    GI Upset Severe panic attacks GI Upset  . Propoxyphene N-Acetaminophen Other (See Comments)    Severe panic attacks  . Levofloxacin Anxiety and Other (See Comments)    Causes panic attacks.    Social History:   Social History   Socioeconomic History  . Marital status: Widowed    Spouse name: Not on file  . Number of children: 0  . Years of education: Not on file  . Highest education level: Not on file  Occupational History  . Occupation: Retired    Fish farm manager: RETIRED  Tobacco Use  . Smoking status: Never Smoker  . Smokeless tobacco: Never Used  Vaping Use  . Vaping Use: Never used  Substance and Sexual Activity  . Alcohol use: Yes    Alcohol/week: 2.0 standard drinks    Types: 2 Standard drinks or equivalent per week    Comment: regular  . Drug use: No  . Sexual activity: Yes  Other Topics Concern  . Not on file  Social History Narrative   Lives in [place] with her husband.  No children 2 step kids.  Former Psychologist, prison and probation services   Enjoys exercise- water classes, yoga Editor, commissioning.    Widowed 2019    Only child and mother was only child   Used to sail with husband      Has a living will-    Would desire CPR   Would not want prolonged life support if futile.      Has 1 cat lives alone no kids but see above    Social Determinants of  Health   Financial Resource Strain: Not on file  Food Insecurity: Not on file  Transportation Needs: Not on file  Physical Activity: Not on file  Stress: Not on file  Social Connections: Not on file  Intimate Partner Violence: Not on file     Family History:   Family History  Problem Relation Age of Onset  . Multiple sclerosis Father   . Breast cancer Neg Hx     ROS:  Review of Systems  Constitutional: Negative for chills, diaphoresis, fever, malaise/fatigue and weight loss.  HENT: Negative for congestion.   Eyes: Negative for discharge and redness.  Respiratory: Negative for cough, sputum production, shortness of breath and wheezing.   Cardiovascular: Negative for chest pain, palpitations, orthopnea, claudication, leg swelling and PND.  Gastrointestinal: Negative for abdominal pain, heartburn, nausea and vomiting.  Musculoskeletal: Positive for falls and joint pain. Negative for myalgias.  Skin: Negative for rash.  Neurological: Positive for loss of consciousness. Negative for dizziness, tingling, tremors, sensory change, speech change, focal weakness and weakness.  Endo/Heme/Allergies: Does not bruise/bleed easily.  Psychiatric/Behavioral: Negative for substance abuse. The patient is not nervous/anxious.       Physical Exam/Data:   Vitals:   12/16/20 1830 12/16/20 2044 12/17/20 0414 12/17/20 0805  BP: 100/82 (!) 112/57 (!) 98/51 (!) 97/51  Pulse:  60 65 69  Resp: _0 Temp:   98.3 F (36.8 C) 98.6 F (37 C)  TempSrc:   Oral Oral  SpO2:  94% 94% 96%  Weight:  Height:        Intake/Output Summary (Last 24 hours) at 12/17/2020 1443 Last data filed at 12/17/2020 1200 Gross per 24 hour  Intake --  Output 1550 ml  Net -1550 ml   Filed Weights   12/16/20 1514  Weight: 72.6 kg   Body mass index is 23.63 kg/m.   Physical Exam: General: Well developed, well nourished, in no acute distress. Head: Normocephalic, atraumatic, sclera non-icteric, no  xanthomas, nares without discharge.  Neck: Negative for carotid bruits. JVD not elevated. Lungs: Clear bilaterally to auscultation without wheezes, rales, or rhonchi. Breathing is unlabored. Heart: RRR with S1 S2. No murmurs, rubs, or gallops appreciated. Abdomen: Soft, non-tender, non-distended with normoactive bowel sounds. No hepatomegaly. No rebound/guarding. No obvious abdominal masses. Msk:  Strength and tone appear normal for age. Extremities: No clubbing or cyanosis. No edema. Neuro: Alert and oriented X 3. No facial asymmetry. No focal deficit. Moves all extremities spontaneously. Psych:  Responds to questions appropriately with a normal affect.   EKG:  The EKG was personally reviewed and demonstrates: Sinus bradycardia, 56 bpm, nonspecific st/t changes  Telemetry:  Telemetry was personally reviewed and demonstrates: SR, 60s bpm  Weights: Filed Weights   12/16/20 1514  Weight: 72.6 kg    Relevant CV Studies:  2D echo 04/2019: 1. The left ventricle has normal systolic function with an ejection  fraction of 60-65%. The cavity size was mildly dilated. Left ventricular  diastolic parameters were normal.  2. The right ventricle has normal systolic function. The cavity was  normal. There is no increase in right ventricular wall thickness. Right  ventricular systolic pressure is normal with an estimated pressure of 28.3  mmHg.  3. The mitral valve is grossly normal.  4. The tricuspid valve is grossly normal.  5. The aortic valve is grossly normal. Aortic valve regurgitation was not  assessed by color flow Doppler.  6. The aorta is normal unless otherwise noted.  Laboratory Data:  Chemistry Recent Labs  Lab 12/16/20 1519 12/17/20 0620  NA 136 138  K 4.6 3.7  CL 104 105  CO2 23 26  GLUCOSE 130* 105*  BUN 40* 22  CREATININE 0.94 0.56  CALCIUM 9.0 8.3*  GFRNONAA >60 >60  ANIONGAP 9 7    Recent Labs  Lab 12/16/20 1519  PROT 6.4*  ALBUMIN 3.7  AST 31  ALT  26  ALKPHOS 52  BILITOT 0.7   Hematology Recent Labs  Lab 12/16/20 1519 12/17/20 0620  WBC 9.5 5.6  RBC 3.80* 3.67*  HGB 11.4* 11.1*  HCT 34.6* 33.4*  MCV 91.1 91.0  MCH 30.0 30.2  MCHC 32.9 33.2  RDW 13.9 14.2  PLT 258 222   Cardiac EnzymesNo results for input(s): TROPONINI in the last 168 hours. No results for input(s): TROPIPOC in the last 168 hours.  BNPNo results for input(s): BNP, PROBNP in the last 168 hours.  DDimer No results for input(s): DDIMER in the last 168 hours.  Radiology/Studies:  DG Ankle Complete Right  Result Date: 12/16/2020 IMPRESSION: 1. Oblique fracture lateral malleolus representing at least a stage II Weber B fracture. No definite medial malleolar or posterior malleolar fragments identified. Electronically Signed   By: Van Clines M.D.   On: 12/16/2020 16:22   CT Head Wo Contrast  Result Date: 12/16/2020 IMPRESSION: 1. Generalized cerebral atrophy. 2. Chronic left occipital lobe infarct. 3. No acute intracranial abnormality. Electronically Signed   By: Virgina Norfolk M.D.   On: 12/16/2020 16:18  CT Cervical Spine Wo Contrast  Result Date: 12/16/2020 IMPRESSION: 1. No acute cervical spine findings. 2. Cervical spondylosis with mild foraminal impingement at C3-4, C5-6, and C6-7. Electronically Signed   By: Van Clines M.D.   On: 12/16/2020 16:20   MR BRAIN WO CONTRAST  Result Date: 12/16/2020 IMPRESSION: 1. No acute intracranial abnormality. 2. Old left thalamic small vessel infarct and old left occipital lobe infarct. Electronically Signed   By: Ulyses Jarred M.D.   On: 12/16/2020 19:33   CT CHEST ABDOMEN PELVIS W CONTRAST  Result Date: 12/16/2020 IMPRESSION: 1. No acute traumatic findings within the chest, abdomen or pelvis. 2. Stable sequela of distal pancreatectomy with unchanged approximately 2.8 x 1.6 cm fluid collection adjacent to the operative site, nonspecific though likely representative of a non enlarging postprocedural  pseudocyst. 3. Postoperative ligation of the splenic vein with several small gastric varices, suboptimally evaluated secondary to phase of enhancement. If there is clinical concern for gastric variceal hemorrhage, further evaluation with endoscopy could be performed as indicated. 4. Previously questioned 1.9 cm minimally complex cystic lesion within the uncinate process of the pancreas is suboptimally evaluated on this non pancreatic protocol examination. Follow-up MRI in July of this year was recommended on previous diagnostic CT performed 08/12/2020. 5. Suspected periportal edema. Correlation with LFTs is advised. 6. Large colonic stool burden with fecalization of the distal small bowel as could be seen in the setting of constipation. 7. Colonic diverticulosis without evidence of acute diverticulitis. Critical Value/emergent results were called by telephone at the time of interpretation on 12/16/2020 at 4:39 pm to provider [facility] , who verbally acknowledged these results. Electronically Signed   By: Sandi Mariscal M.D.   On: 12/16/2020 16:41   CT T-SPINE NO CHARGE  Result Date: 12/16/2020 IMPRESSION: 1. Normal alignment of the thoracic and lumbar vertebral bodies and no acute fracture. 2. Left convex scoliosis and associated moderate degenerative lumbar spondylosis with multilevel disc disease and facet disease, most significant at L2-3 and L5-S1. 3. No large disc protrusions, significant spinal or foraminal stenosis. Electronically Signed   By: Marijo Sanes M.D.   On: 12/16/2020 16:05   CT L-SPINE NO CHARGE  Result Date: 12/16/2020 IMPRESSION: 1. Normal alignment of the thoracic and lumbar vertebral bodies and no acute fracture. 2. Left convex scoliosis and associated moderate degenerative lumbar spondylosis with multilevel disc disease and facet disease, most significant at L2-3 and L5-S1. 3. No large disc protrusions, significant spinal or foraminal stenosis. Electronically Signed   By: Marijo Sanes M.D.   On: 12/16/2020 16:05   DG Foot 2 Views Left  Result Date: 12/16/2020 IMPRESSION: Small fracture of the first left metatarsal of indeterminate age. CT correlation is recommended. Electronically Signed   By: Virgina Norfolk M.D.   On: 12/16/2020 17:24   DG Foot 2 Views Right  Result Date: 12/16/2020 IMPRESSION: Mildly displaced distal right tibial fracture. Electronically Signed   By: Marijo Conception M.D.   On: 12/16/2020 16:38    Assessment and Plan:   1.  Fall versus syncope: -No further episodes -No significant arrhythmias, prolonged pauses, or high-grade AV block noted on telemetry -Check echo -Recommend obtaining orthostatics, however patient is already received IV fluids therefore this would not be indicative of her hemodynamic status in the field when this episode occurred -Attempt to have the patient's interrogated (per rep, last remote interrogation occurred at midnight on 12/16/2020), with her syncopal versus fall episode occurring later that same day  2.  PAF: -Status post ablation -Remains on PTA flecainide, defer to EP -PTA Cardizem held with relative hypotension at this time -CHA2DS2-VASc 3 (CVA x 2, sex category) -Resumed on Eliquis given conservative approach for fracture   For questions or updates, please contact [facility] Please consult [email or web address] for contact info under Cardiology/STEMI.   Signed, Christell Faith, PA-C [place] Pager: [phone] 12/17/2020, 2:43 PM

## 2020-12-17 NOTE — Progress Notes (Signed)
Spoke to [place], Therapist, sports, an independent Art therapist at [employer].  Per facility nurse pt's mental orientation was alerted and oriented to person, place, time, and situation a day before pt had several falls. On the day pt packed for her trip to [place], pt was with altered mental status. Pt was confused and was very disoriented. She lost sense of directions. Per facility nurse pt obtained three falls before admitting to [facility]. Patient's husband died back in 2017-11-23. Therefore, pt lives by herself. Facility nurse provided pt's healthcare medical POA contact information. Name: Monica Whitaker: [phone]. Mr. Monica Whitaker is also in charge of pt's financial and he currently is pt's POA. Will update care team for this pt.

## 2020-12-18 ENCOUNTER — Inpatient Hospital Stay: Payer: PPO

## 2020-12-18 DIAGNOSIS — R001 Bradycardia, unspecified: Secondary | ICD-10-CM

## 2020-12-18 DIAGNOSIS — W19XXXS Unspecified fall, sequela: Secondary | ICD-10-CM

## 2020-12-18 DIAGNOSIS — Z7901 Long term (current) use of anticoagulants: Secondary | ICD-10-CM

## 2020-12-18 DIAGNOSIS — I4811 Longstanding persistent atrial fibrillation: Secondary | ICD-10-CM

## 2020-12-18 DIAGNOSIS — W19XXXD Unspecified fall, subsequent encounter: Secondary | ICD-10-CM

## 2020-12-18 DIAGNOSIS — E860 Dehydration: Secondary | ICD-10-CM

## 2020-12-18 DIAGNOSIS — I480 Paroxysmal atrial fibrillation: Secondary | ICD-10-CM

## 2020-12-18 LAB — ECHOCARDIOGRAM COMPLETE
Area-P 1/2: 2.99 cm2
Height: 69 in
S' Lateral: 3.14 cm
Weight: 2560 oz

## 2020-12-18 NOTE — Progress Notes (Signed)
PROGRESS NOTE    [name]  RUE:[id number] DOB: July 26, 1946 DOA: 12/16/2020 PCP: McLean-Scocuzza, Nino Glow, MD   Brief Narrative: Taken from H&P. Monica Whitaker is an 75 y.o. female with prior history of CVA, restless leg syndrome, chronic atrial fibrillation on anticoagulation, hypothyroidism, anxiety disorder.  At baseline, patient lives at home by self.  She is ADL and IADL independent.  She was brought to ER after having a fall at home.  Resulted in right tibial and metatarsal fracture.  Orthopedic was consulted and they are recommending conservative management and think that metatarsal fracture is chronic. Patient was also found to be hypotensive which responded well to 500 cc of normal saline. Orthopedic is recommending conservative management. Cardiology does not really think that any arrhythmia is associated with her presyncopal episode resulted in fall.  Echocardiogram without any structural abnormality. PT is recommending SNF placement.  Subjective: Patient was having right ankle and right hip pain.  POA at bedside.  She wants to go to [facility] for rehab.  Assessment & Plan:   Active Problems:   Fall   Ankle fracture   PAF (paroxysmal atrial fibrillation) ([name])  Right distal tibial fracture secondary to fall.  Most likely mechanical versus syncopal episode has not been ruled out yet.  Orthopedic was consulted and they are recommending conservative management.  Patient do have an history of prior falls. Hip imaging was done due to her complaint of right hip pain and it was negative for any acute changes or fractures. -Continue with pain management -PT/OT recommending SNF placement-TOC to work on it  Atrial fibrillation with slow ventricular rate.  Patient has an history of atrial fibrillation and takes flecainide and Cardizem at home.  She was found to have bradycardia and hypotensive on arrival which can be contributory to her fall.  Cardiology was consulted-will  appreciate their recommendations. -Keep holding Cardizem for now. -Continue with Eliquis as there is no procedure needed. -Continue with flecainide per cardiology  History of pancreatic cancer.  S/p pancreatectomy.  Had an history of intraductal papillary mucinous neoplasm. -Will need outpatient follow-up  Constipation. -Continue with bowel regimen  History of CVA. -Continue with home statin  History of anxiety/depression.  No acute concern. -Continue with home meds which include Cymbalta and as needed Ativan.  Objective: Vitals:   12/18/20 0024 12/18/20 0517 12/18/20 0740 12/18/20 1543  BP: (!) 121/57 120/64 114/65 116/65  Pulse: 76 75 83 79  Resp: 17 18 17 16   Temp: 98.5 F (36.9 C) 98 F (36.7 C) 97.8 F (36.6 C) 98.4 F (36.9 C)  TempSrc:   Oral Oral  SpO2: 96% 97% 97% 99%  Weight:      Height:        Intake/Output Summary (Last 24 hours) at 12/18/2020 1648 Last data filed at 12/18/2020 1546 Gross per 24 hour  Intake 0 ml  Output 1100 ml  Net -1100 ml   Filed Weights   12/16/20 1514  Weight: 72.6 kg    Examination:  General.  Frail and malnourished elderly lady, in no acute distress. Pulmonary.  Lungs clear bilaterally, normal respiratory effort. CV.  Regular rate and rhythm, no JVD, rub or murmur. Abdomen.  Soft, nontender, nondistended, BS positive. CNS.  Alert and oriented x3.  No focal neurologic deficit. Extremities.  Right lower extremity with splint and Ace wrap, pulses intact and symmetrical. Psychiatry.  Judgment and insight appears normal.  DVT prophylaxis:  Eliquis Code Status: DNR Family Communication: A friend who  is also her POA was updated at bedside. Disposition Plan:  Status is: Inpatient  Remains inpatient appropriate because:Inpatient level of care appropriate due to severity of illness   Dispo: The patient is from: Home              Anticipated d/c is to: SNF              Patient currently is medically stable.   Difficult to  place patient No             Level of care: Med-Surg  All the records are reviewed and case discussed with Care Management/Social Worker. Management plans discussed with the patient, nursing and they are in agreement.  Consultants:   Orthopedic surgery  Cardiology  Procedures:  Antimicrobials:   Data Reviewed: I have personally reviewed following labs and imaging studies  CBC: Recent Labs  Lab 12/16/20 1519 12/17/20 0620  WBC 9.5 5.6  NEUTROABS 6.8  --   HGB 11.4* 11.1*  HCT 34.6* 33.4*  MCV 91.1 91.0  PLT 258 [id number]   Basic Metabolic Panel: Recent Labs  Lab 12/16/20 1519 12/17/20 0620  NA 136 138  K 4.6 3.7  CL 104 105  CO2 23 26  GLUCOSE 130* 105*  BUN 40* 22  CREATININE 0.94 0.56  CALCIUM 9.0 8.3*  MG 2.1  --    GFR: Estimated Creatinine Clearance: 65.5 mL/min (by C-G formula based on SCr of 0.56 mg/dL). Liver Function Tests: Recent Labs  Lab 12/16/20 1519  AST 31  ALT 26  ALKPHOS 52  BILITOT 0.7  PROT 6.4*  ALBUMIN 3.7   No results for input(s): LIPASE, AMYLASE in the last 168 hours. No results for input(s): AMMONIA in the last 168 hours. Coagulation Profile: Recent Labs  Lab 12/16/20 1519  INR 1.4*   Cardiac Enzymes: No results for input(s): CKTOTAL, CKMB, CKMBINDEX, TROPONINI in the last 168 hours. BNP (last 3 results) No results for input(s): PROBNP in the last 8760 hours. HbA1C: No results for input(s): HGBA1C in the last 72 hours. CBG: No results for input(s): GLUCAP in the last 168 hours. Lipid Profile: No results for input(s): CHOL, HDL, LDLCALC, TRIG, CHOLHDL, LDLDIRECT in the last 72 hours. Thyroid Function Tests: No results for input(s): TSH, T4TOTAL, FREET4, T3FREE, THYROIDAB in the last 72 hours. Anemia Panel: No results for input(s): VITAMINB12, FOLATE, FERRITIN, TIBC, IRON, RETICCTPCT in the last 72 hours. Sepsis Labs: Recent Labs  Lab 12/16/20 1554 12/16/20 2027  LATICACIDVEN 2.1* 1.1    Recent Results (from the  past 240 hour(s))  Resp Panel by RT-PCR (Flu A&B, Covid) Nasopharyngeal Swab     Status: None   Collection Time: 12/16/20  3:19 PM   Specimen: Nasopharyngeal Swab; Nasopharyngeal(NP) swabs in vial transport medium  Result Value Ref Range Status   SARS Coronavirus 2 by RT PCR NEGATIVE NEGATIVE Final    Comment: (NOTE) SARS-CoV-2 target nucleic acids are NOT DETECTED.  The SARS-CoV-2 RNA is generally detectable in upper respiratory specimens during the acute phase of infection. The lowest concentration of SARS-CoV-2 viral copies this assay can detect is 138 copies/mL. A negative result does not preclude SARS-Cov-2 infection and should not be used as the sole basis for treatment or other patient management decisions. A negative result may occur with  improper specimen collection/handling, submission of specimen other than nasopharyngeal swab, presence of viral mutation(s) within the areas targeted by this assay, and inadequate number of viral copies(<138 copies/mL). A negative result must be combined  with clinical observations, patient history, and epidemiological information. The expected result is Negative.  Fact Sheet for Patients:  EntrepreneurPulse.com.au  Fact Sheet for Healthcare Providers:  IncredibleEmployment.be  This test is no t yet approved or cleared by the [place] FDA and  has been authorized for detection and/or diagnosis of SARS-CoV-2 by FDA under an Emergency Use Authorization (EUA). This EUA will remain  in effect (meaning this test can be used) for the duration of the COVID-19 declaration under Section 564(b)(1) of the Act, 21 U.S.C.section 360bbb-3(b)(1), unless the authorization is terminated  or revoked sooner.       Influenza A by PCR NEGATIVE NEGATIVE Final   Influenza B by PCR NEGATIVE NEGATIVE Final    Comment: (NOTE) The Xpert Xpress SARS-CoV-2/FLU/RSV plus assay is intended as an aid in the diagnosis of  influenza from Nasopharyngeal swab specimens and should not be used as a sole basis for treatment. Nasal washings and aspirates are unacceptable for Xpert Xpress SARS-CoV-2/FLU/RSV testing.  Fact Sheet for Patients: EntrepreneurPulse.com.au  Fact Sheet for Healthcare Providers: IncredibleEmployment.be  This test is not yet approved or cleared by the [place] FDA and has been authorized for detection and/or diagnosis of SARS-CoV-2 by FDA under an Emergency Use Authorization (EUA). This EUA will remain in effect (meaning this test can be used) for the duration of the COVID-19 declaration under Section 564(b)(1) of the Act, 21 U.S.C. section 360bbb-3(b)(1), unless the authorization is terminated or revoked.  Performed at [facility], [address] [place], [place] [address]      Radiology Studies: MR BRAIN WO CONTRAST  Result Date: 12/16/2020 CLINICAL DATA:  Fall EXAM: MRI HEAD WITHOUT CONTRAST TECHNIQUE: Multiplanar, multiecho pulse sequences of the brain and surrounding structures were obtained without intravenous contrast. COMPARISON:  07/23/2010 FINDINGS: Brain: No acute infarct, mass effect or extra-axial collection. No acute or chronic hemorrhage. There is multifocal hyperintense T2-weighted signal within the white matter. Parenchymal volume and CSF spaces are normal. Old left thalamic small vessel infarct and old left occipital lobe infarct. The midline structures are normal. Vascular: Major flow voids are preserved. Skull and upper cervical spine: Normal calvarium and skull base. Visualized upper cervical spine and soft tissues are normal. Sinuses/Orbits:No paranasal sinus fluid levels or advanced mucosal thickening. No mastoid or middle ear effusion. Normal orbits. IMPRESSION: 1. No acute intracranial abnormality. 2. Old left thalamic small vessel infarct and old left occipital lobe infarct. Electronically Signed   By: Ulyses Jarred M.D.   On: 12/16/2020 19:33   DG Foot 2 Views Left  Result Date: 12/16/2020 CLINICAL DATA:  Status post fall. EXAM: LEFT FOOT - 2 VIEW COMPARISON:  None. FINDINGS: A small fracture deformity of indeterminate age is seen involving the lateral aspect of the base of the first left metatarsal. There is no evidence of arthropathy or other focal bone abnormality. Mild soft tissue swelling is seen along the lateral aspect of the left ankle. IMPRESSION: Small fracture of the first left metatarsal of indeterminate age. CT correlation is recommended. Electronically Signed   By: Virgina Norfolk M.D.   On: 12/16/2020 17:24   ECHOCARDIOGRAM COMPLETE  Result Date: 12/18/2020    ECHOCARDIOGRAM REPORT   Patient Name:   Monica Whitaker Date of Exam: 12/17/2020 Medical Rec #:  [id number]       Height:       69.0 in Accession #:    [id number]      Weight:       160.0 lb  Date of Birth:  07/08/1946      BSA:          1.879 m Patient Age:    63 years        BP:           123/68 mmHg Patient Gender: F               HR:           77 bpm. Exam Location:  [facility] Procedure: 2D Echo, Cardiac Doppler and Color Doppler Indications:     R55 Syncope  History:         Patient has prior history of Echocardiogram examinations, most                  recent 04/11/2019. Ventricular Tachycardia. Thyroid disease.                  Stroke. Atrial fibrillation.  Sonographer:     Wilford Sports Rodgers-Jones Referring Phys:  [id number] [place] Diagnosing Phys: Nelva Bush MD IMPRESSIONS  1. Left ventricular ejection fraction, by estimation, is 55 to 60%. The left ventricle has normal function. The left ventricle has no regional wall motion abnormalities. Left ventricular diastolic parameters were normal.  2. Right ventricular systolic function is normal. The right ventricular size is normal. Tricuspid regurgitation signal is inadequate for assessing PA pressure.  3. The mitral valve is degenerative. Trivial mitral valve regurgitation. No evidence of  mitral stenosis.  4. The aortic valve is tricuspid. There is mild thickening of the aortic valve. Aortic valve regurgitation is not visualized. Mild aortic valve sclerosis is present, with no evidence of aortic valve stenosis.  5. Aortic dilatation noted. There is mild dilatation of the aortic root, measuring 39 mm. There is mild dilatation of the ascending aorta, measuring 37 mm.  6. The inferior vena cava is dilated in size with <50% respiratory variability, suggesting right atrial pressure of 15 mmHg. FINDINGS  Left Ventricle: Left ventricular ejection fraction, by estimation, is 55 to 60%. The left ventricle has normal function. The left ventricle has no regional wall motion abnormalities. The left ventricular internal cavity size was normal in size. There is  no left ventricular hypertrophy. Left ventricular diastolic parameters were normal. Right Ventricle: The right ventricular size is normal. Right vetricular wall thickness was not well visualized. Right ventricular systolic function is normal. Tricuspid regurgitation signal is inadequate for assessing PA pressure. Left Atrium: Left atrial size was normal in size. Right Atrium: Right atrial size was normal in size. Pericardium: There is no evidence of pericardial effusion. Mitral Valve: The mitral valve is degenerative in appearance. There is mild thickening of the mitral valve leaflet(s). There is mild calcification of the mitral valve leaflet(s). Trivial mitral valve regurgitation. No evidence of mitral valve stenosis. Tricuspid Valve: The tricuspid valve is normal in structure. Tricuspid valve regurgitation is not demonstrated. Aortic Valve: The aortic valve is tricuspid. There is mild thickening of the aortic valve. Aortic valve regurgitation is not visualized. Mild aortic valve sclerosis is present, with no evidence of aortic valve stenosis. Pulmonic Valve: The pulmonic valve was not well visualized. Pulmonic valve regurgitation is not visualized. No  evidence of pulmonic stenosis. Aorta: Aortic dilatation noted. There is mild dilatation of the aortic root, measuring 39 mm. There is mild dilatation of the ascending aorta, measuring 37 mm. Pulmonary Artery: The pulmonary artery is not well seen. Venous: The inferior vena cava is dilated in size with less than 50% respiratory  variability, suggesting right atrial pressure of 15 mmHg. IAS/Shunts: The interatrial septum was not well visualized.  LEFT VENTRICLE PLAX 2D LVIDd:         5.17 cm  Diastology LVIDs:         3.14 cm  LV e' medial:    8.16 cm/s LV PW:         0.82 cm  LV E/e' medial:  10.0 LV IVS:        0.98 cm  LV e' lateral:   11.30 cm/s LVOT diam:     2.10 cm  LV E/e' lateral: 7.2 LV SV:         68 LV SV Index:   36 LVOT Area:     3.46 cm  RIGHT VENTRICLE             IVC RV Basal diam:  3.73 cm     IVC diam: 2.33 cm RV S prime:     17.30 cm/s TAPSE (M-mode): 3.3 cm LEFT ATRIUM             Index       RIGHT ATRIUM           Index LA diam:        4.90 cm 2.61 cm/m  RA Area:     12.40 cm LA Vol (A2C):   47.5 ml 25.28 ml/m RA Volume:   30.30 ml  16.12 ml/m LA Vol (A4C):   44.6 ml 23.74 ml/m LA Biplane Vol: 46.0 ml 24.48 ml/m  AORTIC VALVE LVOT Vmax:   98.55 cm/s LVOT Vmean:  68.050 cm/s LVOT VTI:    0.196 m  AORTA Ao Root diam: 3.90 cm Ao Asc diam:  3.70 cm MITRAL VALVE MV Area (PHT): 2.99 cm    SHUNTS MV Decel Time: 254 msec    Systemic VTI:  0.20 m MV E velocity: 81.50 cm/s  Systemic Diam: 2.10 cm MV A velocity: 73.80 cm/s MV E/A ratio:  1.10 Harrell Gave End MD Electronically signed by Nelva Bush MD Signature Date/Time: 12/18/2020/8:19:30 AM    Final    DG HIP UNILAT WITH PELVIS 2-3 VIEWS RIGHT  Result Date: 12/18/2020 CLINICAL DATA:  Right hip pain after fall 2 days ago. EXAM: DG HIP (WITH OR WITHOUT PELVIS) 2-3V RIGHT COMPARISON:  None. FINDINGS: There is no evidence of hip fracture or dislocation. There is no evidence of arthropathy or other focal bone abnormality. IMPRESSION: Negative.  Electronically Signed   By: Marijo Conception M.D.   On: 12/18/2020 14:35    Scheduled Meds: . apixaban  5 mg Oral BID  . DULoxetine  60 mg Oral BID  . flecainide  100 mg Oral Q12H  . gabapentin  200 mg Oral BID  . levothyroxine  25 mcg Oral QAC breakfast  . multivitamin with minerals  1 tablet Oral Daily  . pravastatin  10 mg Oral QHS  . timolol  1 drop Left Eye QHS   Continuous Infusions:   LOS: 2 days   Time spent: 30 minutes. More than 50% of the time was spent in counseling/coordination of care  Lorella Nimrod, MD [facility]  If 7PM-7AM, please contact night-coverage [email or web address]  12/18/2020, 4:48 PM   This record has been created using Systems analyst. Errors have been sought and corrected,but may not always be located. Such creation errors do not reflect on the standard of care.

## 2020-12-18 NOTE — Progress Notes (Signed)
Progress Note  Patient Name: Monica Whitaker Date of Encounter: 12/18/2020  Primary Cardiologist: Allred  Subjective   ILR interrogated 5/17 which only showed an episode of Afib around 7 AM without significant R-R interval or post-termination pause and does not appear to correlate with timing of her episode as EMS arrived on scene in the early afternoon. BP remains on the soft side. Echo pending. No chest pain, dyspnea, palpitations, dizziness, presyncope, or syncope. She is quite confused regarding care of her fracture.   Inpatient Medications    Scheduled Meds: . apixaban  5 mg Oral BID  . DULoxetine  60 mg Oral BID  . flecainide  100 mg Oral Q12H  . gabapentin  200 mg Oral BID  . levothyroxine  25 mcg Oral QAC breakfast  . multivitamin with minerals  1 tablet Oral Daily  . pravastatin  10 mg Oral QHS  . timolol  1 drop Left Eye QHS   Continuous Infusions:  PRN Meds: acetaminophen, albuterol, amphetamine-dextroamphetamine, LORazepam, senna-docusate, traMADol   Vital Signs    Vitals:   12/17/20 2037 12/18/20 0024 12/18/20 0517 12/18/20 0740  BP: 123/68 (!) 121/57 120/64 114/65  Pulse: 84 76 75 83  Resp: 18 17 18 17   Temp: 98.3 F (36.8 C) 98.5 F (36.9 C) 98 F (36.7 C) 97.8 F (36.6 C)  TempSrc: Oral   Oral  SpO2: 95% 96% 97% 97%  Weight:      Height:        Intake/Output Summary (Last 24 hours) at 12/18/2020 0742 Last data filed at 12/17/2020 2039 Gross per 24 hour  Intake --  Output 800 ml  Net -800 ml   Filed Weights   12/16/20 1514  Weight: 72.6 kg    Telemetry    SR with rare PVCs and artifact - Personally Reviewed  ECG    No new tracings - Personally Reviewed  Physical Exam   GEN: No acute distress.   Neck: No JVD. Cardiac: RRR, no murmurs, rubs, or gallops.  Respiratory: Clear to auscultation bilaterally.  GI: Soft, nontender, non-distended.   MS: Trace pretibial edema; Right lower extremity wrapped. Neuro:  Alert and oriented x 3;  Nonfocal.  Psych: Normal affect.  Labs    Chemistry Recent Labs  Lab 12/16/20 1519 12/17/20 0620  NA 136 138  K 4.6 3.7  CL 104 105  CO2 23 26  GLUCOSE 130* 105*  BUN 40* 22  CREATININE 0.94 0.56  CALCIUM 9.0 8.3*  PROT 6.4*  --   ALBUMIN 3.7  --   AST 31  --   ALT 26  --   ALKPHOS 52  --   BILITOT 0.7  --   GFRNONAA >60 >60  ANIONGAP 9 7     Hematology Recent Labs  Lab 12/16/20 1519 12/17/20 0620  WBC 9.5 5.6  RBC 3.80* 3.67*  HGB 11.4* 11.1*  HCT 34.6* 33.4*  MCV 91.1 91.0  MCH 30.0 30.2  MCHC 32.9 33.2  RDW 13.9 14.2  PLT 258 222    Cardiac EnzymesNo results for input(s): TROPONINI in the last 168 hours. No results for input(s): TROPIPOC in the last 168 hours.   BNPNo results for input(s): BNP, PROBNP in the last 168 hours.   DDimer No results for input(s): DDIMER in the last 168 hours.   Radiology    DG Ankle Complete Right  Result Date: 12/16/2020 IMPRESSION: 1. Oblique fracture lateral malleolus representing at least a stage II Weber B fracture.  No definite medial malleolar or posterior malleolar fragments identified. Electronically Signed   By: Van Clines M.D.   On: 12/16/2020 16:22   CT Head Wo Contrast  Result Date: 12/16/2020 IMPRESSION: 1. Generalized cerebral atrophy. 2. Chronic left occipital lobe infarct. 3. No acute intracranial abnormality. Electronically Signed   By: Virgina Norfolk M.D.   On: 12/16/2020 16:18   CT Cervical Spine Wo Contrast  Result Date: 12/16/2020 IMPRESSION: 1. No acute cervical spine findings. 2. Cervical spondylosis with mild foraminal impingement at C3-4, C5-6, and C6-7. Electronically Signed   By: Van Clines M.D.   On: 12/16/2020 16:20   MR BRAIN WO CONTRAST  Result Date: 12/16/2020 IMPRESSION: 1. No acute intracranial abnormality. 2. Old left thalamic small vessel infarct and old left occipital lobe infarct. Electronically Signed   By: Ulyses Jarred M.D.   On: 12/16/2020 19:33   CT CHEST  ABDOMEN PELVIS W CONTRAST  Result Date: 12/16/2020 IMPRESSION: 1. No acute traumatic findings within the chest, abdomen or pelvis. 2. Stable sequela of distal pancreatectomy with unchanged approximately 2.8 x 1.6 cm fluid collection adjacent to the operative site, nonspecific though likely representative of a non enlarging postprocedural pseudocyst. 3. Postoperative ligation of the splenic vein with several small gastric varices, suboptimally evaluated secondary to phase of enhancement. If there is clinical concern for gastric variceal hemorrhage, further evaluation with endoscopy could be performed as indicated. 4. Previously questioned 1.9 cm minimally complex cystic lesion within the uncinate process of the pancreas is suboptimally evaluated on this non pancreatic protocol examination. Follow-up MRI in July of this year was recommended on previous diagnostic CT performed 08/12/2020. 5. Suspected periportal edema. Correlation with LFTs is advised. 6. Large colonic stool burden with fecalization of the distal small bowel as could be seen in the setting of constipation. 7. Colonic diverticulosis without evidence of acute diverticulitis. Critical Value/emergent results were called by telephone at the time of interpretation on 12/16/2020 at 4:39 pm to provider [facility] , who verbally acknowledged these results. Electronically Signed   By: Sandi Mariscal M.D.   On: 12/16/2020 16:41   CT T-SPINE NO CHARGE  Result Date: 12/16/2020 IMPRESSION: 1. Normal alignment of the thoracic and lumbar vertebral bodies and no acute fracture. 2. Left convex scoliosis and associated moderate degenerative lumbar spondylosis with multilevel disc disease and facet disease, most significant at L2-3 and L5-S1. 3. No large disc protrusions, significant spinal or foraminal stenosis. Electronically Signed   By: Marijo Sanes M.D.   On: 12/16/2020 16:05   CT L-SPINE NO CHARGE  Result Date: 12/16/2020 IMPRESSION: 1. Normal alignment of  the thoracic and lumbar vertebral bodies and no acute fracture. 2. Left convex scoliosis and associated moderate degenerative lumbar spondylosis with multilevel disc disease and facet disease, most significant at L2-3 and L5-S1. 3. No large disc protrusions, significant spinal or foraminal stenosis. Electronically Signed   By: Marijo Sanes M.D.   On: 12/16/2020 16:05   DG Foot 2 Views Left  Result Date: 12/16/2020 IMPRESSION: Small fracture of the first left metatarsal of indeterminate age. CT correlation is recommended. Electronically Signed   By: Virgina Norfolk M.D.   On: 12/16/2020 17:24   DG Foot 2 Views Right  Result Date: 12/16/2020 IMPRESSION: Mildly displaced distal right tibial fracture. Electronically Signed   By: Marijo Conception M.D.   On: 12/16/2020 16:38    Cardiac Studies   2D echo 04/2019: 1. The left ventricle has normal systolic function with an ejection  fraction of 60-65%. The cavity size was mildly dilated. Left ventricular  diastolic parameters were normal.  2. The right ventricle has normal systolic function. The cavity was  normal. There is no increase in right ventricular wall thickness. Right  ventricular systolic pressure is normal with an estimated pressure of 28.3  mmHg.  3. The mitral valve is grossly normal.  4. The tricuspid valve is grossly normal.  5. The aortic valve is grossly normal. Aortic valve regurgitation was not  assessed by color flow Doppler.  6. The aorta is normal unless otherwise noted.  Patient Profile     75 y.o. 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 Phineas Inches.  Assessment & Plan    1. Fall versus syncope: -No further episodes -No significant arrhythmias, prolonged  pauses, or high-grade AV block noted on telemetry or on ILR interrogation  -Echo pending -Orthostatics still not performed  -Cannot exclude mechanical fall vs orthostasis, does not appear to be arrythmogenic etiology at this time  -Possibly complicated by UTI?  2.  PAF: -Status post ablation -Remains on PTA flecainide, defer possible titration to EP -PTA Cardizem held with relative hypotension at this time, would continue to hold -CHA2DS2-VASc 3 (CVA x 2, sex category) -Resumed on Eliquis given conservative approach for fracture  For questions or updates, please contact [facility] Please consult [email or web address] for contact info under Cardiology/STEMI.    Signed, Christell Faith, PA-C [place] Pager: [phone] 12/18/2020, 7:42 AM

## 2020-12-18 NOTE — TOC Progression Note (Signed)
Transition of Care [facility]) - Progression Note    Patient Details  Name: Monica Whitaker MRN: [id number] Date of Birth: 1946/05/06  [facility]) CM/SW Contact  Su Hilt, RN Phone Number: 12/18/2020, 4:19 PM  Clinical Narrative:   Called [facility] oncall phone and spoke to [facility] EMS auth and SNF auth to go to [employer] [facility].  For first choice         Expected Discharge Plan and Services                                                 Social Determinants of Health (SDOH) Interventions    Readmission Risk Interventions Readmission Risk Prevention Plan 02/15/2020  Post Dischage Appt Complete  Medication Screening Complete  Transportation Screening Complete  Some recent data might be hidden

## 2020-12-18 NOTE — TOC Progression Note (Signed)
Transition of Care [facility]) - Progression Note    Patient Details  Name: Monica Whitaker MRN: [id number] Date of Birth: 25-Apr-1946  Transition of Care [facility]) CM/SW [place], RN Phone Number: 12/18/2020, 12:16 PM  Clinical Narrative:   Received a call from [facility], they are recommending that the patient go to the SNF area which is also recommended by PT to gain strength and mobility, Spoke to [place] in the room along with the patient and they agree with going to SNF.  Will need to start ins approval process once Medically cleared to go to [facility]         Expected Discharge Plan and Services                                                 Social Determinants of Health (SDOH) Interventions    Readmission Risk Interventions Readmission Risk Prevention Plan 02/15/2020  Post Dischage Appt Complete  Medication Screening Complete  Transportation Screening Complete  Some recent data might be hidden

## 2020-12-18 NOTE — Evaluation (Signed)
Physical Therapy Evaluation Patient Details Name: Monica Whitaker MRN: [id number] DOB: [birth year] Today's Date: 12/18/2020   History of Present Illness  Per MD Notes: Pt is a 75 y.o. female who presented to the ED with R ankle pain after sustaining a mechanical fall in the home. Pt does not recall circumstances surrounding the fall. Immaging revealed oblique fracture of the R lateral malleolus, distal right tibial fracture as well as small fracture of the first left metatarsal of indeterminate age. Orthopedic recommending conservative managemnt. Pt has been confused with cognition variable/different from baseline since admission. PMH includes: Anxiety, depression, CVA, AFIB.    Clinical Impression  Pt pleasantly confused but put forth good effort during the session.  Pt had difficulty providing history, was alert to self only, but was able to follow commands well and maintained RLE TDWB status throughout the session. Pt required min to mod A with bed mobility tasks for RLE management and then min A to stand from an elevated EOB.  Once in standing pt was unable to advance her LLE despite cuing for sequencing and good effort.  Of note, pt c/o R hip pain more so than R ankle pain with mobility, ortho PA in room at end of session and notified.  Pt will benefit from PT services in a SNF setting upon discharge to safely address deficits listed in patient problem list for decreased caregiver assistance and eventual return to PLOF.      Follow Up Recommendations SNF;Supervision/Assistance - 24 hour    Equipment Recommendations  Other (comment) (TBD at next venue of care)    Recommendations for Other Services       Precautions / Restrictions Precautions Precautions: Fall Required Braces or Orthoses: Splint/Cast Splint/Cast: RLE Restrictions Weight Bearing Restrictions: Yes RLE Weight Bearing: Touchdown weight bearing LLE Weight Bearing: Weight bearing as tolerated Other Position/Activity  Restrictions: LLE WBAT      Mobility  Bed Mobility Overal bed mobility: Needs Assistance Bed Mobility: Sit to Supine;Supine to Sit Rolling: Min assist   Supine to sit: Min assist Sit to supine: Mod assist   General bed mobility comments: Min to Mod A for RLE management    Transfers Overall transfer level: Needs assistance Equipment used: Rolling walker (2 wheeled) Transfers: Sit to/from Stand Sit to Stand: Min assist;+2 safety/equipment;From elevated surface         General transfer comment: Min A to stand from an elevated EOB with verbal cues for sequencing for RLE WB compliance  Ambulation/Gait             General Gait Details: Unable  Stairs            Wheelchair Mobility    Modified Rankin (Stroke Patients Only)       Balance Overall balance assessment: Needs assistance Sitting-balance support: Bilateral upper extremity supported (single foot supported; RLE maintains TDWB) Sitting balance-Leahy Scale: Good     Standing balance support: Bilateral upper extremity supported;During functional activity Standing balance-Leahy Scale: Fair Standing balance comment: Min A to come to standing but pt able to maintain static standing with physical assist with BUE support on the RW                             Pertinent Vitals/Pain Pain Assessment: 0-10 Pain Score: 5  Faces Pain Scale: Hurts whole lot Pain Location: R ankle at rest and R hip with movement Pain Descriptors / Indicators: Grimacing;Guarding Pain Intervention(s): Premedicated before session;Monitored  during session;Repositioned    Home Living Family/patient expects to be discharged to:: Private residence Living Arrangements: Alone Available Help at Discharge: Friend(s);Available PRN/intermittently Type of Home: Independent living facility Home Access: Level entry     Home Layout: One level Home Equipment: Toilet riser Additional Comments: Pt lives at [facility] in [place] area     Prior Function Level of Independence: Independent         Comments: Ind amb community distances without an AD, no other fall history, Ind with ADLs     Hand Dominance        Extremity/Trunk Assessment   Upper Extremity Assessment Upper Extremity Assessment: Overall WFL for tasks assessed    Lower Extremity Assessment Lower Extremity Assessment: RLE deficits/detail RLE Deficits / Details: Significantly pain limited, TDWB precautions with lower leg splint in place.       Communication   Communication: Other (comment) (Word finding difficulty)  Cognition Arousal/Alertness: Awake/alert Behavior During Therapy: WFL for tasks assessed/performed Overall Cognitive Status: Impaired/Different from baseline Area of Impairment: Orientation;Memory                 Orientation Level: Disoriented to;Place;Time;Situation   Memory: Decreased recall of precautions;Decreased short-term memory Following Commands: Follows one step commands consistently Safety/Judgement: Decreased awareness of safety     General Comments: Pt alert to self only with difficulty providing detailed history but followed 1-step commands well      General Comments      Exercises Total Joint Exercises Ankle Circles/Pumps: AROM;Strengthening;Left;10 reps Quad Sets: Strengthening;Both;10 reps Gluteal Sets: Strengthening;Both;10 reps Hip ABduction/ADduction: AAROM;Strengthening;Both;10 reps Straight Leg Raises: AAROM;Strengthening;Both;10 reps Long Arc Quad: AROM;Strengthening;Both;10 reps Knee Flexion: Both;10 reps;Strengthening;AROM Other Exercises Other Exercises: Extensive time taken to educate pt on TDWB precautions, bed mobility, falls prevention strategies for home and hospital, and save use of AE for functional mobility. Pt recall limited 2/2 cogntion. would benefit from futher review. Other Exercises: Bed mobility including sup<>sit & rolling R/L, STS x2, Bed level peri-care/clean up, and  linnen change with NT in for +2 assist during functional mobility.   Assessment/Plan    PT Assessment Patient needs continued PT services  PT Problem List Decreased strength;Decreased activity tolerance;Decreased balance;Decreased mobility;Decreased knowledge of use of DME;Pain;Decreased knowledge of precautions       PT Treatment Interventions DME instruction;Gait training;Functional mobility training;Therapeutic activities;Therapeutic exercise;Balance training;Patient/family education    PT Goals (Current goals can be found in the Care Plan section)  Acute Rehab PT Goals Patient Stated Goal: To go back to [facility] PT Goal Formulation: With patient Time For Goal Achievement: 12/31/20 Potential to Achieve Goals: Good    Frequency 7X/week   Barriers to discharge Decreased caregiver support      Co-evaluation               AM-PAC PT "6 Clicks" Mobility  Outcome Measure Help needed turning from your back to your side while in a flat bed without using bedrails?: A Little Help needed moving from lying on your back to sitting on the side of a flat bed without using bedrails?: A Little Help needed moving to and from a bed to a chair (including a wheelchair)?: A Lot Help needed standing up from a chair using your arms (e.g., wheelchair or bedside chair)?: A Little Help needed to walk in hospital room?: Total Help needed climbing 3-5 steps with a railing? : Total 6 Click Score: 13    End of Session Equipment Utilized During Treatment: Gait belt Activity Tolerance: Patient  tolerated treatment well Patient left: in bed;with call bell/phone within reach;with bed alarm set;with SCD's reapplied;with family/visitor present Nurse Communication: Mobility status;Weight bearing status PT Visit Diagnosis: Unsteadiness on feet (R26.81);History of falling (Z91.81);Muscle weakness (generalized) (M62.81);Pain;Other abnormalities of gait and mobility (R26.89) Pain - Right/Left: Right Pain -  part of body: Hip;Ankle and joints of foot    Time: 1245-8099 PT Time Calculation (min) (ACUTE ONLY): 35 min   Charges:   PT Evaluation $PT Eval Moderate Complexity: 1 Mod PT Treatments $Therapeutic Exercise: 8-22 mins       D. Scott Orla Estrin PT, DPT 12/18/20, 11:58 AM

## 2020-12-18 NOTE — NC FL2 (Signed)
[place] LEVEL OF CARE SCREENING TOOL     IDENTIFICATION  Patient Name: Monica Whitaker Birthdate: 05-13-1946 Sex: female Admission Date (Current Location): 12/16/2020  [place] and [place] Number:  Engineering geologist and Address:  [facility], [address] [place], [place] [address]      Provider Number: [id number]  Attending Physician Name and Address:  Lorella Nimrod, MD  Relative Name and Phone Number:  Mickey Farber [facility]  [phone]    Current Level of Care: Hospital Recommended Level of Care: [place] Prior Approval Number:    Date Approved/Denied:   PASRR Number: [id number]  Discharge Plan: SNF    Current Diagnoses: Patient Active Problem List   Diagnosis Date Noted  . Ankle fracture 12/16/2020  . IPMN (intraductal papillary mucinous neoplasm) 03/01/2020  . Lung nodule 03/01/2020  . Scarring of lung following radiation ([place]) 03/01/2020  . Pancreatic mass 02/13/2020  . Panic attack 12/15/2019  . Cystic mass of pancreas 12/12/2019  . Essential tremor 09/22/2019  . Hyperlipidemia 09/22/2019  . Chronic female pelvic pain 05/24/2019  . Atrial fibrillation ([place])   . Anxiety   . Tremor 05/10/2019  . RLS (restless legs syndrome) 05/10/2019  . History of stroke 05/10/2019  . Orthostatic hypotension 11/04/2017  . Hypothyroidism 11/04/2017  . OSA on CPAP 06/21/2017  . Persistent atrial fibrillation 06/21/2017  . Cerebrovascular accident (CVA) due to embolism of left middle cerebral artery ([place]) 03/04/2017  . Osteoporosis 12/26/2015  . ANXIETY DEPRESSION 04/10/2010    Orientation RESPIRATION BLADDER Height & Weight     Self,Place  Normal Incontinent Weight: 72.6 kg Height:  5\' 9"  (175.3 cm)  BEHAVIORAL SYMPTOMS/MOOD NEUROLOGICAL BOWEL NUTRITION STATUS      Incontinent Diet (heart healthy)  AMBULATORY STATUS COMMUNICATION OF NEEDS Skin   Extensive Assist Verbally Bruising                        Personal Care Assistance Level of Assistance  Bathing,Dressing Bathing Assistance: Limited assistance   Dressing Assistance: Limited assistance     Functional Limitations Info             SPECIAL CARE FACTORS FREQUENCY  PT (By licensed PT)     PT Frequency: 5 times per week              Contractures Contractures Info: Not present    Additional Factors Info  Code Status,Allergies Code Status Info: DNR Allergies Info: Darvon, Propoxyphene, Propoxyphene N-acetaminophen, Levofloxacin           Current Medications (12/18/2020):  This is the current hospital active medication list Current Facility-Administered Medications  Medication Dose Route Frequency Provider Last Rate Last Admin  . acetaminophen (TYLENOL) tablet 1,000 mg  1,000 mg Oral Q6H PRN Artist Beach, MD   1,000 mg at 12/18/20 1118  . albuterol (PROVENTIL) (2.5 MG/3ML) 0.083% nebulizer solution 2.5 mg  2.5 mg Nebulization Q6H PRN Acheampong, Warnell Bureau, MD      . amphetamine-dextroamphetamine (ADDERALL XR) 24 hr capsule 10 mg  10 mg Oral Daily PRN Acheampong, Warnell Bureau, MD      . apixaban Arne [place]) tablet 5 mg  5 mg Oral BID Lorella Nimrod, MD   5 mg at 12/18/20 1054  . DULoxetine (CYMBALTA) DR capsule 60 mg  60 mg Oral BID Acheampong, Warnell Bureau, MD   60 mg at 12/18/20 1055  . flecainide (TAMBOCOR) tablet 100 mg  100 mg Oral Q12H Acheampong,  Warnell Bureau, MD   100 mg at 12/18/20 1055  . gabapentin (NEURONTIN) capsule 200 mg  200 mg Oral BID Lorella Nimrod, MD   200 mg at 12/18/20 1055  . levothyroxine (SYNTHROID) tablet 25 mcg  25 mcg Oral QAC breakfast Acheampong, Warnell Bureau, MD   25 mcg at 12/18/20 0533  . LORazepam (ATIVAN) tablet 0.5 mg  0.5 mg Oral Q8H PRN Lorella Nimrod, MD   0.5 mg at 12/18/20 4970  . multivitamin with minerals tablet 1 tablet  1 tablet Oral Daily Lorella Nimrod, MD   1 tablet at 12/18/20 1055  . pravastatin (PRAVACHOL) tablet 10 mg  10 mg Oral QHS Artist Beach, MD   10 mg at 12/17/20 2122   . senna-docusate (Senokot-S) tablet 1 tablet  1 tablet Oral QHS PRN Acheampong, Warnell Bureau, MD      . timolol (TIMOPTIC) 0.5 % ophthalmic solution 1 drop  1 drop Left Eye QHS Acheampong, Warnell Bureau, MD   1 drop at 12/17/20 2121  . traMADol (ULTRAM) tablet 50 mg  50 mg Oral Q6H PRN Lorella Nimrod, MD   50 mg at 12/18/20 2637     Discharge Medications: Please see discharge summary for a list of discharge medications.  Relevant Imaging Results:  Relevant Lab Results:   Additional Information SS#[id number] [place], RN

## 2020-12-18 NOTE — Progress Notes (Signed)
Subjective:  Patient reports pain as mild to moderate.  Up with PT.  Complaining of right hip pain.  Objective:   VITALS:   Vitals:   12/17/20 2037 12/18/20 0024 12/18/20 0517 12/18/20 0740  BP: 123/68 (!) 121/57 120/64 114/65  Pulse: 84 76 75 83  Resp: 18 17 18 17   Temp: 98.3 F (36.8 C) 98.5 F (36.9 C) 98 F (36.7 C) 97.8 F (36.6 C)  TempSrc: Oral   Oral  SpO2: 95% 96% 97% 97%  Weight:      Height:        PHYSICAL EXAM:  Neurologically intact ABD soft Neurovascular intact Sensation intact distally Intact pulses distally Dorsiflexion/Plantar flexion intact No cellulitis present Compartment soft  LABS  No results found for this or any previous visit (from the past 24 hour(s)).  DG Ankle Complete Right  Result Date: 12/16/2020 CLINICAL DATA:  Multiple falls, ankle swelling and pain EXAM: RIGHT ANKLE - COMPLETE 3+ VIEW COMPARISON:  Radiographs from 07/31/2013 FINDINGS: Oblique fracture of the lateral malleolus with extensive overlying subcutaneous edema. No well-defined fracture of the medial or posterior malleolus. Plafond and talar dome otherwise intact. IMPRESSION: 1. Oblique fracture lateral malleolus representing at least a stage II Weber B fracture. No definite medial malleolar or posterior malleolar fragments identified. Electronically Signed   By: Van Clines M.D.   On: 12/16/2020 16:22   CT Head Wo Contrast  Result Date: 12/16/2020 CLINICAL DATA:  Dizziness and subsequent fall. EXAM: CT HEAD WITHOUT CONTRAST TECHNIQUE: Contiguous axial images were obtained from the base of the skull through the vertex without intravenous contrast. COMPARISON:  November 09, 2014 FINDINGS: Brain: There is mild cerebral atrophy with widening of the extra-axial spaces and ventricular dilatation. There are areas of decreased attenuation within the white matter tracts of the supratentorial brain, consistent with microvascular disease changes. An area of encephalomalacia, with  adjacent chronic white matter low attenuation, is seen within the left occipital lobe. Vascular: No hyperdense vessel or unexpected calcification. Skull: Normal. Negative for fracture or focal lesion. Sinuses/Orbits: Mild posterior bilateral ethmoid sinus mucosal thickening is seen. Other: None. IMPRESSION: 1. Generalized cerebral atrophy. 2. Chronic left occipital lobe infarct. 3. No acute intracranial abnormality. Electronically Signed   By: Virgina Norfolk M.D.   On: 12/16/2020 16:18   CT Cervical Spine Wo Contrast  Result Date: 12/16/2020 CLINICAL DATA:  Dizziness, fall.  Dementia. EXAM: CT CERVICAL SPINE WITHOUT CONTRAST TECHNIQUE: Multidetector CT imaging of the cervical spine was performed without intravenous contrast. Multiplanar CT image reconstructions were also generated. COMPARISON:  10/30/2015 FINDINGS: Alignment: 2 mm degenerative retrolisthesis at C4-5. 2.5 mm degenerative anterolisthesis at C7-T1. Skull base and vertebrae: No fracture or acute bony findings identified. Soft tissues and spinal canal: Unremarkable Disc levels: Foraminal stenosis due to uncinate and facet spurring on the right at C3-4 and C5-6; and on the left at C3-4 and C6-7. Suspected disc bulge at C4-5 without significant central narrowing of the thecal sac. Upper chest: Unremarkable Other: No supplemental non-categorized findings. IMPRESSION: 1. No acute cervical spine findings. 2. Cervical spondylosis with mild foraminal impingement at C3-4, C5-6, and C6-7. Electronically Signed   By: Van Clines M.D.   On: 12/16/2020 16:20   MR BRAIN WO CONTRAST  Result Date: 12/16/2020 CLINICAL DATA:  Fall EXAM: MRI HEAD WITHOUT CONTRAST TECHNIQUE: Multiplanar, multiecho pulse sequences of the brain and surrounding structures were obtained without intravenous contrast. COMPARISON:  07/23/2010 FINDINGS: Brain: No acute infarct, mass effect or extra-axial collection. No acute  or chronic hemorrhage. There is multifocal hyperintense  T2-weighted signal within the white matter. Parenchymal volume and CSF spaces are normal. Old left thalamic small vessel infarct and old left occipital lobe infarct. The midline structures are normal. Vascular: Major flow voids are preserved. Skull and upper cervical spine: Normal calvarium and skull base. Visualized upper cervical spine and soft tissues are normal. Sinuses/Orbits:No paranasal sinus fluid levels or advanced mucosal thickening. No mastoid or middle ear effusion. Normal orbits. IMPRESSION: 1. No acute intracranial abnormality. 2. Old left thalamic small vessel infarct and old left occipital lobe infarct. Electronically Signed   By: Ulyses Jarred M.D.   On: 12/16/2020 19:33   CT CHEST ABDOMEN PELVIS W CONTRAST  Result Date: 12/16/2020 CLINICAL DATA:  Post fall. Hypotension. History of partial pancreatectomy for IPMN. EXAM: CT CHEST, ABDOMEN, AND PELVIS WITH CONTRAST TECHNIQUE: Multidetector CT imaging of the chest, abdomen and pelvis was performed following the standard protocol during bolus administration of intravenous contrast. CONTRAST:  73mL OMNIPAQUE IOHEXOL 300 MG/ML  SOLN COMPARISON:  CT abdomen pelvis-08/12/2020; chest CT-03/20/2020; coronary CT-04/13/2019 FINDINGS: CT CHEST FINDINGS Cardiovascular: Normal heart size. No pericardial effusion though a small amount of fluid is seen with the pericardial recess. No evidence of thoracic aortic aneurysm or dissection on this nongated examination. Conventional configuration of the aortic arch. The branch vessels of the aortic arch appear patent throughout their imaged courses. There is a minimal amount of atherosclerotic plaque involving the aortic arch, not resulting in a hemodynamically significant stenosis. Although this examination was not tailored for the evaluation the pulmonary arteries, there are no discrete filling defects within the central pulmonary arterial tree to suggest central pulmonary embolism. Borderline enlarged caliber of the  main pulmonary artery measuring 32 mm in diameter. Mediastinum/Nodes: Mildly prominent right hilar and mediastinal lymph nodes are grossly unchanged compared to the 04/2019 cardiac CT with index right infrahilar lymph node measuring approximately 1.3 cm in greatest short axis diameter (image 40, series 2) and precarinal lymph node measuring 0.9 cm (image 27, series 2, similar to chest CT 03/2020, nonspecific though presumably reactive in etiology. No bulky mediastinal or axillary lymphadenopathy. Lungs/Pleura: Minimal dependent subpleural ground-glass atelectasis. Subsegmental atelectasis involving the right lower lobe and basilar segment of the lingula. No discrete focal airspace opacities. No pleural effusion or pneumothorax. The central pulmonary airways appear widely patent. No discrete pulmonary nodules. Musculoskeletal: No acute or aggressive osseous abnormalities. Regional soft tissues appear normal. Normal appearance of the thyroid gland. _________________________________________________________ CT ABDOMEN PELVIS FINDINGS Hepatobiliary: Normal hepatic contour. There is a minimal amount of focal fatty infiltration adjacent to the fissure for ligamentum teres. No discrete hepatic lesions. Normal appearance of the gallbladder given degree of distention. No radiopaque gallstones. Suspected minimal amount of periportal edema without definitive intra or extrahepatic biliary duct dilatation. No ascites. Pancreas: Sequela of distal pancreatectomy with unchanged approximately 2.8 x 1.5 cm fluid collection at the operative site (image 61, series 2), previously, 3.1 x 1.9 cm. Questioned 2.0 cm cystic lesion within the uncinate process of the pancreas is suboptimally evaluated on the present examination (image 67, series 2). No peripancreatic stranding. Spleen: Normal appearance of the spleen. Adrenals/Urinary Tract: There is symmetric enhancement of the bilateral kidneys. No definite evidence of nephrolithiasis on this  postcontrast examination. Subcentimeter right-sided renal lesions are too small to adequately characterize though favored to represent renal cysts. No discrete left-sided renal lesions. No urine obstruction or perinephric stranding. Normal appearance the bilateral adrenal glands. Normal appearance of the urinary bladder given  degree of distention. Stomach/Bowel: Large colonic stool burden without evidence of enteric obstruction. Feculent material is seen within the distal small bowel. The normal appearance of the retrocecal appendix. Scattered colonic diverticulosis without evidence superimposed acute diverticulitis. No discrete areas of bowel wall thickening. No definitive hiatal hernia. No pneumoperitoneum, pneumatosis or portal venous gas. Vascular/Lymphatic: Minimal amount of atherosclerotic plaque within a normal caliber abdominal aorta. The major branch vessels of the abdominal aorta appear patent on this non CTA examination. Redemonstrated postoperative occlusion the SMV the with several suspected gastric varices, suboptimally evaluated secondary to phase of enhancement. No bulky retroperitoneal, mesenteric, pelvic or inguinal lymphadenopathy. Reproductive: Post hysterectomy. No discrete adnexal lesions. No free fluid the pelvic cul-de-sac. Other: Tiny mesenteric fat containing periumbilical hernia. Musculoskeletal: No acute or aggressive osseous abnormalities. Moderate to severe multilevel lumbar spine DDD, worse at L2-L3 and L5-S1 with disc space height loss, endplate irregularity and sclerosis. Note is made of a right-sided L5-S1 assimilation joint. Mild scoliotic curvature of the thoracolumbar spine with dominant caudal component convex the left, potentially degenerative in etiology. IMPRESSION: 1. No acute traumatic findings within the chest, abdomen or pelvis. 2. Stable sequela of distal pancreatectomy with unchanged approximately 2.8 x 1.6 cm fluid collection adjacent to the operative site, nonspecific  though likely representative of a non enlarging postprocedural pseudocyst. 3. Postoperative ligation of the splenic vein with several small gastric varices, suboptimally evaluated secondary to phase of enhancement. If there is clinical concern for gastric variceal hemorrhage, further evaluation with endoscopy could be performed as indicated. 4. Previously questioned 1.9 cm minimally complex cystic lesion within the uncinate process of the pancreas is suboptimally evaluated on this non pancreatic protocol examination. Follow-up MRI in July of this year was recommended on previous diagnostic CT performed 08/12/2020. 5. Suspected periportal edema. Correlation with LFTs is advised. 6. Large colonic stool burden with fecalization of the distal small bowel as could be seen in the setting of constipation. 7. Colonic diverticulosis without evidence of acute diverticulitis. Critical Value/emergent results were called by telephone at the time of interpretation on 12/16/2020 at 4:39 pm to provider [facility] , who verbally acknowledged these results. Electronically Signed   By: Sandi Mariscal M.D.   On: 12/16/2020 16:41   CT T-SPINE NO CHARGE  Result Date: 12/16/2020 CLINICAL DATA:  [place]. EXAM: CT THORACIC AND LUMBAR SPINE WITHOUT CONTRAST TECHNIQUE: Multidetector CT imaging of the thoracic and lumbar spine was performed without contrast. Multiplanar CT image reconstructions were also generated. COMPARISON:  None. FINDINGS: CT THORACIC SPINE FINDINGS Alignment: Normal Vertebrae: Intact. No compression fracture. No bone lesion. The facets are normally aligned. No facet or laminar fractures or pedicle fractures. Paraspinal and other soft tissues: No significant paraspinal findings. Disc levels: No large thoracic disc protrusions or spinal stenosis. CT LUMBAR SPINE FINDINGS Segmentation: There are five lumbar type vertebral bodies. The last full intervertebral disc space is labeled L5-S1. Alignment: Left convex scoliosis and  associated moderate degenerative lumbar spondylosis with multilevel disc disease and facet disease, most significant at L2-3 and L5-S1. Vertebrae: No acute lumbar spine fracture. The vertebral bodies are maintained. Advanced facet disease but no pars defects or acute pars fractures. Paraspinal and other soft tissues: No significant paraspinal or retroperitoneal findings. Disc levels: No large lumbar disc protrusions, significant spinal or foraminal stenosis. IMPRESSION: 1. Normal alignment of the thoracic and lumbar vertebral bodies and no acute fracture. 2. Left convex scoliosis and associated moderate degenerative lumbar spondylosis with multilevel disc disease and facet disease, most significant at  L2-3 and L5-S1. 3. No large disc protrusions, significant spinal or foraminal stenosis. Electronically Signed   By: Marijo Sanes M.D.   On: 12/16/2020 16:05   CT L-SPINE NO CHARGE  Result Date: 12/16/2020 CLINICAL DATA:  [place]. EXAM: CT THORACIC AND LUMBAR SPINE WITHOUT CONTRAST TECHNIQUE: Multidetector CT imaging of the thoracic and lumbar spine was performed without contrast. Multiplanar CT image reconstructions were also generated. COMPARISON:  None. FINDINGS: CT THORACIC SPINE FINDINGS Alignment: Normal Vertebrae: Intact. No compression fracture. No bone lesion. The facets are normally aligned. No facet or laminar fractures or pedicle fractures. Paraspinal and other soft tissues: No significant paraspinal findings. Disc levels: No large thoracic disc protrusions or spinal stenosis. CT LUMBAR SPINE FINDINGS Segmentation: There are five lumbar type vertebral bodies. The last full intervertebral disc space is labeled L5-S1. Alignment: Left convex scoliosis and associated moderate degenerative lumbar spondylosis with multilevel disc disease and facet disease, most significant at L2-3 and L5-S1. Vertebrae: No acute lumbar spine fracture. The vertebral bodies are maintained. Advanced facet disease but no pars defects  or acute pars fractures. Paraspinal and other soft tissues: No significant paraspinal or retroperitoneal findings. Disc levels: No large lumbar disc protrusions, significant spinal or foraminal stenosis. IMPRESSION: 1. Normal alignment of the thoracic and lumbar vertebral bodies and no acute fracture. 2. Left convex scoliosis and associated moderate degenerative lumbar spondylosis with multilevel disc disease and facet disease, most significant at L2-3 and L5-S1. 3. No large disc protrusions, significant spinal or foraminal stenosis. Electronically Signed   By: Marijo Sanes M.D.   On: 12/16/2020 16:05   DG Foot 2 Views Left  Result Date: 12/16/2020 CLINICAL DATA:  Status post fall. EXAM: LEFT FOOT - 2 VIEW COMPARISON:  None. FINDINGS: A small fracture deformity of indeterminate age is seen involving the lateral aspect of the base of the first left metatarsal. There is no evidence of arthropathy or other focal bone abnormality. Mild soft tissue swelling is seen along the lateral aspect of the left ankle. IMPRESSION: Small fracture of the first left metatarsal of indeterminate age. CT correlation is recommended. Electronically Signed   By: Virgina Norfolk M.D.   On: 12/16/2020 17:24   DG Foot 2 Views Right  Result Date: 12/16/2020 CLINICAL DATA:  Right foot pain and swelling after multiple falls. EXAM: RIGHT FOOT - 2 VIEW COMPARISON:  None. FINDINGS: Distal right tibial fracture is noted. No other bony abnormality is noted. There is no evidence of arthropathy or other focal bone abnormality. Soft tissues are unremarkable. IMPRESSION: Mildly displaced distal right tibial fracture. Electronically Signed   By: Marijo Conception M.D.   On: 12/16/2020 16:38   ECHOCARDIOGRAM COMPLETE  Result Date: 12/18/2020    ECHOCARDIOGRAM REPORT   Patient Name:   KAYLAHNI SEIDLE Date of Exam: 12/17/2020 Medical Rec #:  [id number]       Height:       69.0 in Accession #:    [id number]      Weight:       160.0 lb Date of Birth:   01-Mar-1946      BSA:          1.879 m Patient Age:    75 years        BP:           123/68 mmHg Patient Gender: F               HR:           77  bpm. Exam Location:  [facility] Procedure: 2D Echo, Cardiac Doppler and Color Doppler Indications:     R55 Syncope  History:         Patient has prior history of Echocardiogram examinations, most                  recent 04/11/2019. Ventricular Tachycardia. Thyroid disease.                  Stroke. Atrial fibrillation.  Sonographer:     Wilford Sports Rodgers-Tiasia Weberg Referring Phys:  [id number] [place] Diagnosing Phys: Nelva Bush MD IMPRESSIONS  1. Left ventricular ejection fraction, by estimation, is 55 to 60%. The left ventricle has normal function. The left ventricle has no regional wall motion abnormalities. Left ventricular diastolic parameters were normal.  2. Right ventricular systolic function is normal. The right ventricular size is normal. Tricuspid regurgitation signal is inadequate for assessing PA pressure.  3. The mitral valve is degenerative. Trivial mitral valve regurgitation. No evidence of mitral stenosis.  4. The aortic valve is tricuspid. There is mild thickening of the aortic valve. Aortic valve regurgitation is not visualized. Mild aortic valve sclerosis is present, with no evidence of aortic valve stenosis.  5. Aortic dilatation noted. There is mild dilatation of the aortic root, measuring 39 mm. There is mild dilatation of the ascending aorta, measuring 37 mm.  6. The inferior vena cava is dilated in size with <50% respiratory variability, suggesting right atrial pressure of 15 mmHg. FINDINGS  Left Ventricle: Left ventricular ejection fraction, by estimation, is 55 to 60%. The left ventricle has normal function. The left ventricle has no regional wall motion abnormalities. The left ventricular internal cavity size was normal in size. There is  no left ventricular hypertrophy. Left ventricular diastolic parameters were normal. Right Ventricle: The right  ventricular size is normal. Right vetricular wall thickness was not well visualized. Right ventricular systolic function is normal. Tricuspid regurgitation signal is inadequate for assessing PA pressure. Left Atrium: Left atrial size was normal in size. Right Atrium: Right atrial size was normal in size. Pericardium: There is no evidence of pericardial effusion. Mitral Valve: The mitral valve is degenerative in appearance. There is mild thickening of the mitral valve leaflet(s). There is mild calcification of the mitral valve leaflet(s). Trivial mitral valve regurgitation. No evidence of mitral valve stenosis. Tricuspid Valve: The tricuspid valve is normal in structure. Tricuspid valve regurgitation is not demonstrated. Aortic Valve: The aortic valve is tricuspid. There is mild thickening of the aortic valve. Aortic valve regurgitation is not visualized. Mild aortic valve sclerosis is present, with no evidence of aortic valve stenosis. Pulmonic Valve: The pulmonic valve was not well visualized. Pulmonic valve regurgitation is not visualized. No evidence of pulmonic stenosis. Aorta: Aortic dilatation noted. There is mild dilatation of the aortic root, measuring 39 mm. There is mild dilatation of the ascending aorta, measuring 37 mm. Pulmonary Artery: The pulmonary artery is not well seen. Venous: The inferior vena cava is dilated in size with less than 50% respiratory variability, suggesting right atrial pressure of 15 mmHg. IAS/Shunts: The interatrial septum was not well visualized.  LEFT VENTRICLE PLAX 2D LVIDd:         5.17 cm  Diastology LVIDs:         3.14 cm  LV e' medial:    8.16 cm/s LV PW:         0.82 cm  LV E/e' medial:  10.0 LV IVS:  0.98 cm  LV e' lateral:   11.30 cm/s LVOT diam:     2.10 cm  LV E/e' lateral: 7.2 LV SV:         68 LV SV Index:   36 LVOT Area:     3.46 cm  RIGHT VENTRICLE             IVC RV Basal diam:  3.73 cm     IVC diam: 2.33 cm RV S prime:     17.30 cm/s TAPSE (M-mode): 3.3 cm  LEFT ATRIUM             Index       RIGHT ATRIUM           Index LA diam:        4.90 cm 2.61 cm/m  RA Area:     12.40 cm LA Vol (A2C):   47.5 ml 25.28 ml/m RA Volume:   30.30 ml  16.12 ml/m LA Vol (A4C):   44.6 ml 23.74 ml/m LA Biplane Vol: 46.0 ml 24.48 ml/m  AORTIC VALVE LVOT Vmax:   98.55 cm/s LVOT Vmean:  68.050 cm/s LVOT VTI:    0.196 m  AORTA Ao Root diam: 3.90 cm Ao Asc diam:  3.70 cm MITRAL VALVE MV Area (PHT): 2.99 cm    SHUNTS MV Decel Time: 254 msec    Systemic VTI:  0.20 m MV E velocity: 81.50 cm/s  Systemic Diam: 2.10 cm MV A velocity: 73.80 cm/s MV E/A ratio:  1.10 Harrell Gave End MD Electronically signed by Nelva Bush MD Signature Date/Time: 12/18/2020/8:19:30 AM    Final     Assessment/Plan:     Active Problems:   Ankle fracture   Advance diet Up with therapy Plan non-operative treatment.  She may be TDWB on the right and WBAT on the left.  Will continue to follow.  The metatarsal fracture appears to be old based on the x-ray and exam Cam boot for RLE Portable r hip imaging ordered for pain complaint buring PT Follow up in 7-10 days. Please call for appointment [place] , PA-C 12/18/2020, 10:46 AM

## 2020-12-18 NOTE — Progress Notes (Signed)
Patient tried to get up throughout the night.  Patient had four bowel movements and urinated via purewick.  The patient is alert and oriented to self.  Confusion was observed throughout the night.

## 2020-12-18 NOTE — Evaluation (Signed)
Occupational Therapy Evaluation Patient Details Name: Monica Whitaker MRN: [id number] DOB: 18-Sep-1945 Today's Date: 12/18/2020    History of Present Illness Per MD Notes: Monica Whitaker is a 75 y.o. female who presented to the ED with R ankle pain after sustaining a mechanical fall in the home. Pt does not recall circumstances surrounding the fall. Immaging revealed  oblique fracture of the R lateral malleolus, Distal right tibial fracture as well as small fracture of the first left metatarsal of indeterminate age. Orthopedic recommending conservative managemnt. Pt has been confused with cognition variable/different from baseline since admission. PMH includes: Anxiety, depression, CVA, AFIB.   Clinical Impression   Pt was seen for OT evaluation this date. Information regarding PLOF/home set-up obtained from chart review as pt is oriented to self and limited place/situation t/o OT evaluation. Prior to hospital admission, pt was independent in ADL/IADL tasks. She lives in independent living at [facility] and per chart is generally alert and oriented x4. Currently pt demonstrates impairments in strength, ROM, cognition, balance, & safety awareness, as described below (See OT problem list) which functionally limit her ability to perform ADL/self-care tasks. Pt currently requires MIN-MOD A +2 for bed mobility as well as Min A +2 for STS transfers and bed level peri-care. She has orders for her RLE to remain immobilized and TDWB per MD.  Pt would benefit from skilled OT services to address noted impairments and functional limitations (see below for any additional details) in order to maximize safety and independence while minimizing falls risk and caregiver burden. Upon hospital discharge, recommend STR to maximize pt safety and return to PLOF.      Follow Up Recommendations  SNF;Supervision/Assistance - 24 hour (Pt requests [facility] STR if available.)    Equipment Recommendations  Other (comment)  (TBD at next venue of care.)    Recommendations for Other Services       Precautions / Restrictions Precautions Precautions: Fall Required Braces or Orthoses: Splint/Cast Splint/Cast: RLE Restrictions Weight Bearing Restrictions: Yes RLE Weight Bearing: Touchdown weight bearing Other Position/Activity Restrictions: LLE WBAT      Mobility Bed Mobility Overal bed mobility: Needs Assistance Bed Mobility: Sit to Supine;Supine to Sit;Rolling Rolling: Min assist   Supine to sit: +2 for physical assistance;Min assist Sit to supine: Mod assist   General bed mobility comments: Max multimodal cueing required 2/2 pt cognition and difficulty following VCs. Patient Response: Anxious  Transfers Overall transfer level: Needs assistance Equipment used: Rolling walker (2 wheeled) Transfers: Sit to/from Stand Sit to Stand: Min assist;+2 physical assistance         General transfer comment: Min A for lift off from EOB. Pt performs STSx2 with Min A from bed at variable heights. She requires consistent cueing to maintain TDWB precautions.    Balance Overall balance assessment: Needs assistance Sitting-balance support: Bilateral upper extremity supported (single foot supported; RLE maintains TDWB) Sitting balance-Leahy Scale: Fair     Standing balance support: Bilateral upper extremity supported;During functional activity Standing balance-Leahy Scale: Poor Standing balance comment: Requires MIN A to maintain static standing balance with posterior lean appreciated. RLE maintains TDWB                           ADL either performed or assessed with clinical judgement   ADL Overall ADL's : Needs assistance/impaired Eating/Feeding: Supervision/ safety   Grooming: Sitting;Wash/dry face;Set up;Supervision/safety  Toileting- Clothing Manipulation and Hygiene: Bed level;Maximal assistance Toileting - Clothing Manipulation Details (indicate cue type  and reason): due to congition.     Functional mobility during ADLs: Minimal assistance;+2 for safety/equipment;+2 for physical assistance;Cueing for sequencing;Rolling walker;Cueing for safety General ADL Comments: Max multimodal cueing provided t/o session to support safety and cognition.     Vision Patient Visual Report: No change from baseline       Perception     Praxis      Pertinent Vitals/Pain Pain Assessment: Faces Faces Pain Scale: Hurts whole lot Pain Location: RLE Pain Descriptors / Indicators: Throbbing;Grimacing;Guarding Pain Intervention(s): Limited activity within patient's tolerance;Monitored during session;Repositioned;Utilized relaxation techniques;Patient requesting pain meds-RN notified     Hand Dominance     Extremity/Trunk Assessment Upper Extremity Assessment Upper Extremity Assessment: Overall WFL for tasks assessed (RUE/LUE WFL)   Lower Extremity Assessment Lower Extremity Assessment: RLE deficits/detail RLE Deficits / Details: Significantly pain limited, TDWB precautions with lower leg splint in place.       Communication Communication Communication: Other (comment) (Significant difficulty noted with word finding)   Cognition Arousal/Alertness: Awake/alert Behavior During Therapy: Anxious Overall Cognitive Status: Impaired/Different from baseline Area of Impairment: Orientation;Following commands;Safety/judgement;Memory                 Orientation Level: Disoriented to;Place;Time;Situation   Memory: Decreased recall of precautions;Decreased short-term memory Following Commands: Follows one step commands with increased time Safety/Judgement: Decreased awareness of safety     General Comments: Pt cognition significantly impaired/different from baseline per chart. She is oriented to self only, and has difficulty providing her full name. She is able to state she is in the hospital but cannot provide name of hospital or city. Disoriented  to date. Significant difficulty following precautions with multimodal cueing provided t/o session to maintain TDWB precautions during session.   General Comments       Exercises Other Exercises Other Exercises: Extensive time taken to educate pt on TDWB precautions, bed mobility, falls prevention strategies for home and hospital, and save use of AE for functional mobility. Pt recall limited 2/2 cogntion. would benefit from futher review. Other Exercises: Bed mobility including sup<>sit & rolling R/L, STS x2, Bed level peri-care/clean up, and linnen change with NT in for +2 assist during functional mobility.   Shoulder Instructions      Home Living Family/patient expects to be discharged to:: Private residence Living Arrangements: Alone Available Help at Discharge: Friend(s);Available PRN/intermittently Type of Home: Independent living facility Home Access: Level entry     Home Layout: One level     Bathroom Shower/Tub: Tub/shower unit         Home Equipment: Toilet riser          Prior Functioning/Environment Level of Independence: Independent        Comments: Information regarding home set-up/PLOF obtained primarily from chart review. Pt able to confirm she lives at [facility] independently living but is unable to provide specific details 2/2 impaired cognition.        OT Problem List: Decreased strength;Decreased coordination;Pain;Decreased range of motion;Decreased cognition;Decreased safety awareness;Impaired balance (sitting and/or standing);Decreased knowledge of use of DME or AE;Decreased knowledge of precautions      OT Treatment/Interventions: Self-care/ADL training;Therapeutic exercise;Therapeutic activities;DME and/or AE instruction;Patient/family education;Balance training;Energy conservation;Cognitive remediation/compensation    OT Goals(Current goals can be found in the care plan section) Acute Rehab OT Goals Patient Stated Goal: To go back to [facility] OT Goal Formulation: With patient Time For Goal Achievement: 01/01/21  Potential to Achieve Goals: Good ADL Goals Pt Will Perform Grooming: sitting;with modified independence Pt Will Perform Lower Body Dressing: sit to/from stand;with adaptive equipment;with min guard assist Pt Will Transfer to Toilet: bedside commode;with min guard assist;stand pivot transfer Pt Will Perform Toileting - Clothing Manipulation and hygiene: sitting/lateral leans;with adaptive equipment;with set-up;with supervision  OT Frequency: Min 2X/week   Barriers to D/C: Decreased caregiver support          Co-evaluation              AM-PAC OT "6 Clicks" Daily Activity     Outcome Measure Help from another person eating meals?: None Help from another person taking care of personal grooming?: A Little Help from another person toileting, which includes using toliet, bedpan, or urinal?: A Lot Help from another person bathing (including washing, rinsing, drying)?: A Lot Help from another person to put on and taking off regular upper body clothing?: A Little Help from another person to put on and taking off regular lower body clothing?: A Lot 6 Click Score: 16   End of Session Equipment Utilized During Treatment: Rolling walker;Gait belt  Activity Tolerance: Patient tolerated treatment well Patient left: in bed;with call bell/phone within reach;with bed alarm set;with nursing/sitter in room (With NT in room)  OT Visit Diagnosis: Other abnormalities of gait and mobility (R26.89);Repeated falls (R29.6);Pain;Other symptoms and signs involving cognitive function Pain - Right/Left: Right Pain - part of body: Knee;Leg;Ankle and joints of foot                Time: 0160-1093 OT Time Calculation (min): 46 min Charges:  OT General Charges $OT Visit: 1 Visit OT Evaluation $OT Eval Moderate Complexity: 1 Mod OT Treatments $Self Care/Home Management : 23-37 mins  Shara Blazing, M.S., OTR/L Ascom:  [phone] 12/18/20, 9:54 AM

## 2020-12-19 DIAGNOSIS — S82201D Unspecified fracture of shaft of right tibia, subsequent encounter for closed fracture with routine healing: Secondary | ICD-10-CM | POA: Diagnosis not present

## 2020-12-19 DIAGNOSIS — R52 Pain, unspecified: Secondary | ICD-10-CM | POA: Diagnosis not present

## 2020-12-19 DIAGNOSIS — Z711 Person with feared health complaint in whom no diagnosis is made: Secondary | ICD-10-CM | POA: Diagnosis not present

## 2020-12-19 DIAGNOSIS — I4891 Unspecified atrial fibrillation: Secondary | ICD-10-CM | POA: Diagnosis not present

## 2020-12-19 DIAGNOSIS — G4733 Obstructive sleep apnea (adult) (pediatric): Secondary | ICD-10-CM | POA: Diagnosis not present

## 2020-12-19 DIAGNOSIS — M6281 Muscle weakness (generalized): Secondary | ICD-10-CM | POA: Diagnosis not present

## 2020-12-19 DIAGNOSIS — S92355A Nondisplaced fracture of fifth metatarsal bone, left foot, initial encounter for closed fracture: Secondary | ICD-10-CM

## 2020-12-19 DIAGNOSIS — E860 Dehydration: Secondary | ICD-10-CM | POA: Diagnosis not present

## 2020-12-19 DIAGNOSIS — S82891A Other fracture of right lower leg, initial encounter for closed fracture: Secondary | ICD-10-CM | POA: Diagnosis not present

## 2020-12-19 DIAGNOSIS — I480 Paroxysmal atrial fibrillation: Secondary | ICD-10-CM

## 2020-12-19 DIAGNOSIS — M25551 Pain in right hip: Secondary | ICD-10-CM

## 2020-12-19 DIAGNOSIS — G8929 Other chronic pain: Secondary | ICD-10-CM | POA: Diagnosis not present

## 2020-12-19 DIAGNOSIS — W19XXXD Unspecified fall, subsequent encounter: Secondary | ICD-10-CM | POA: Diagnosis not present

## 2020-12-19 DIAGNOSIS — F329 Major depressive disorder, single episode, unspecified: Secondary | ICD-10-CM | POA: Diagnosis not present

## 2020-12-19 DIAGNOSIS — S8261XA Displaced fracture of lateral malleolus of right fibula, initial encounter for closed fracture: Secondary | ICD-10-CM | POA: Diagnosis not present

## 2020-12-19 DIAGNOSIS — M2550 Pain in unspecified joint: Secondary | ICD-10-CM | POA: Diagnosis not present

## 2020-12-19 DIAGNOSIS — Z7401 Bed confinement status: Secondary | ICD-10-CM | POA: Diagnosis not present

## 2020-12-19 DIAGNOSIS — G2581 Restless legs syndrome: Secondary | ICD-10-CM | POA: Diagnosis not present

## 2020-12-19 DIAGNOSIS — F419 Anxiety disorder, unspecified: Secondary | ICD-10-CM | POA: Diagnosis not present

## 2020-12-19 DIAGNOSIS — Z748 Other problems related to care provider dependency: Secondary | ICD-10-CM | POA: Diagnosis not present

## 2020-12-19 DIAGNOSIS — S82301D Unspecified fracture of lower end of right tibia, subsequent encounter for closed fracture with routine healing: Secondary | ICD-10-CM | POA: Diagnosis not present

## 2020-12-19 DIAGNOSIS — I4811 Longstanding persistent atrial fibrillation: Secondary | ICD-10-CM | POA: Diagnosis not present

## 2020-12-19 DIAGNOSIS — I959 Hypotension, unspecified: Secondary | ICD-10-CM | POA: Diagnosis not present

## 2020-12-19 DIAGNOSIS — Z483 Aftercare following surgery for neoplasm: Secondary | ICD-10-CM | POA: Diagnosis not present

## 2020-12-19 DIAGNOSIS — I63412 Cerebral infarction due to embolism of left middle cerebral artery: Secondary | ICD-10-CM | POA: Diagnosis not present

## 2020-12-19 DIAGNOSIS — R001 Bradycardia, unspecified: Secondary | ICD-10-CM | POA: Diagnosis not present

## 2020-12-19 DIAGNOSIS — I639 Cerebral infarction, unspecified: Secondary | ICD-10-CM | POA: Diagnosis not present

## 2020-12-19 DIAGNOSIS — I482 Chronic atrial fibrillation, unspecified: Secondary | ICD-10-CM | POA: Diagnosis not present

## 2020-12-19 DIAGNOSIS — S8264XD Nondisplaced fracture of lateral malleolus of right fibula, subsequent encounter for closed fracture with routine healing: Secondary | ICD-10-CM | POA: Diagnosis not present

## 2020-12-19 DIAGNOSIS — D378 Neoplasm of uncertain behavior of other specified digestive organs: Secondary | ICD-10-CM | POA: Diagnosis not present

## 2020-12-19 DIAGNOSIS — R413 Other amnesia: Secondary | ICD-10-CM | POA: Diagnosis not present

## 2020-12-19 DIAGNOSIS — R102 Pelvic and perineal pain: Secondary | ICD-10-CM | POA: Diagnosis not present

## 2020-12-19 DIAGNOSIS — Z90410 Acquired total absence of pancreas: Secondary | ICD-10-CM | POA: Diagnosis not present

## 2020-12-19 DIAGNOSIS — T07XXXA Unspecified multiple injuries, initial encounter: Secondary | ICD-10-CM | POA: Diagnosis not present

## 2020-12-19 DIAGNOSIS — E039 Hypothyroidism, unspecified: Secondary | ICD-10-CM | POA: Diagnosis not present

## 2020-12-19 DIAGNOSIS — N3000 Acute cystitis without hematuria: Secondary | ICD-10-CM | POA: Diagnosis not present

## 2020-12-19 DIAGNOSIS — S92909D Unspecified fracture of unspecified foot, subsequent encounter for fracture with routine healing: Secondary | ICD-10-CM | POA: Diagnosis not present

## 2020-12-19 DIAGNOSIS — E785 Hyperlipidemia, unspecified: Secondary | ICD-10-CM | POA: Diagnosis not present

## 2020-12-19 DIAGNOSIS — S92901D Unspecified fracture of right foot, subsequent encounter for fracture with routine healing: Secondary | ICD-10-CM | POA: Diagnosis not present

## 2020-12-19 DIAGNOSIS — R2681 Unsteadiness on feet: Secondary | ICD-10-CM | POA: Diagnosis not present

## 2020-12-19 DIAGNOSIS — G250 Essential tremor: Secondary | ICD-10-CM | POA: Diagnosis not present

## 2020-12-19 DIAGNOSIS — R2689 Other abnormalities of gait and mobility: Secondary | ICD-10-CM | POA: Diagnosis not present

## 2020-12-19 DIAGNOSIS — Z7901 Long term (current) use of anticoagulants: Secondary | ICD-10-CM | POA: Diagnosis not present

## 2020-12-19 DIAGNOSIS — Z8673 Personal history of transient ischemic attack (TIA), and cerebral infarction without residual deficits: Secondary | ICD-10-CM | POA: Diagnosis not present

## 2020-12-19 MED ORDER — TRAMADOL HCL 50 MG PO TABS: 50.0000 mg | ORAL_TABLET | Freq: Four times a day (QID) | ORAL | 0 refills | Status: DC | PRN

## 2020-12-19 NOTE — Telephone Encounter (Signed)
Pt is in the hospital at present, looks like she will be discharged to SNF for rehab.  Will await pt call back for appt.

## 2020-12-19 NOTE — Telephone Encounter (Signed)
-----   Message from Alben Spittle sent at 12/13/2020 11:54 AM EDT ----- Good morning!  I have called this pt several times on both listed numbers. No return call. Sent letter to pt asking for call to schedule   Thank you,  Doylene Canning  ----- Message ----- From: Imagene Gurney Sent: 12/06/2020   2:13 PM EDT To: Alben Spittle   ----- Message ----- From: Brynda Peon, RN Sent: 12/06/2020  11:52 AM EDT To: [facility] Scheduling  Pt last saw Dr Rayann Heman 10/11/19, pt is overdue for follow-up. Recall in Epic was due for 6 month follow-up on 04/2020.  Pt requesting refill on Eliquis.  Please call pt to schedule follow-up. Thanks

## 2020-12-19 NOTE — TOC Progression Note (Addendum)
Transition of Care [facility]) - Progression Note    Patient Details  Name: Monica Whitaker MRN: [id number] Date of Birth: 1946-07-16  Transition of Care [facility]) CM/SW [place], RN Phone Number: 12/19/2020, 1:45 PM  Clinical Narrative:    Received a call from [facility] the [place] was approved for SNF auth number [id number] EMS auth number [id number] for [facility]        Expected Discharge Plan and Services                                                 Social Determinants of Health (SDOH) Interventions    Readmission Risk Interventions Readmission Risk Prevention Plan 02/15/2020  Post Dischage Appt Complete  Medication Screening Complete  Transportation Screening Complete  Some recent data might be hidden

## 2020-12-19 NOTE — TOC Progression Note (Signed)
Transition of Care [facility]) - Progression Note    Patient Details  Name: Monica Whitaker MRN: [id number] Date of Birth: 1946/07/19  Transition of Care [facility]) CM/SW Contact  Su Hilt, RN Phone Number: 12/19/2020, 3:03 PM  Clinical Narrative:    notified the Nurse, [facility] called EMS the patient is next on the list, she will go to room 113 at [facility] and report to be called to  [phone]       Expected Discharge Plan and Services           Expected Discharge Date: 12/19/20                                     Social Determinants of Health (SDOH) Interventions    Readmission Risk Interventions Readmission Risk Prevention Plan 02/15/2020  Post Dischage Appt Complete  Medication Screening Complete  Transportation Screening Complete  Some recent data might be hidden

## 2020-12-19 NOTE — Progress Notes (Signed)
Occupational Therapy Treatment Patient Details Name: Monica Whitaker MRN: [id number] DOB: 15-Sep-1945 Today's Date: 12/19/2020    History of present illness Per MD Notes: Pt is a 75 y.o. female who presented to the ED with R ankle pain after sustaining a mechanical fall in the home. Pt does not recall circumstances surrounding the fall. Immaging revealed oblique fracture of the R lateral malleolus, distal right tibial fracture as well as small fracture of the first left metatarsal of indeterminate age. Orthopedic recommending conservative managemnt. Pt has been confused with cognition variable/different from baseline since admission. PMH includes: Anxiety, depression, CVA, AFIB.   OT comments  Pt seen for OT tx this date. Friend present. Pt oriented to self, unsure why she has a boot/cast on her RLE and unable to recall precautions. Pt reoriented, educated in precautions and need for boot. Pt performed bed mobility with min-mod A for RLE mgt and cues for sequencing. Pt endorsing R anterior thigh. Pt instructed in lateral scoots EOB requiring assist for RLE mgt and continuous cues. RN provided pain meds during session. Pt continues to benefit from skilled OT Services. SNF for short term rehab remains appropriate.    Follow Up Recommendations  SNF;Supervision/Assistance - 24 hour    Equipment Recommendations  3 in 1 bedside commode    Recommendations for Other Services      Precautions / Restrictions Precautions Precautions: Fall Required Braces or Orthoses: Splint/Cast Splint/Cast: RLE Restrictions Weight Bearing Restrictions: Yes RLE Weight Bearing: Touchdown weight bearing LLE Weight Bearing: Weight bearing as tolerated       Mobility Bed Mobility Overal bed mobility: Needs Assistance Bed Mobility: Sit to Supine;Supine to Sit     Supine to sit: Min assist;Mod assist Sit to supine: Mod assist;Min assist   General bed mobility comments: RLE mgt, cues for sequencing     Transfers Overall transfer level: Needs assistance   Transfers: Lateral/Scoot Transfers           General transfer comment: cues for sequencing lateral scoots EOB and maintaining TDWBing through RLE requiring physical assist for RLE mgt    Balance Overall balance assessment: Needs assistance Sitting-balance support: Bilateral upper extremity supported Sitting balance-Leahy Scale: Fair Sitting balance - Comments: pain limited                                   ADL either performed or assessed with clinical judgement   ADL Overall ADL's : Needs assistance/impaired                                       General ADL Comments: continues to require max cueing for bed mobility and EOB ADL attempts     Vision Patient Visual Report: No change from baseline     Perception     Praxis      Cognition Arousal/Alertness: Awake/alert Behavior During Therapy: Anxious Overall Cognitive Status: Impaired/Different from baseline Area of Impairment: Orientation;Memory;Following commands;Problem solving                 Orientation Level: Disoriented to;Place;Time;Situation   Memory: Decreased recall of precautions;Decreased short-term memory Following Commands: Follows one step commands consistently;Follows multi-step commands inconsistently Safety/Judgement: Decreased awareness of safety;Decreased awareness of deficits   Problem Solving: Difficulty sequencing;Requires verbal cues          Exercises Other Exercises Other Exercises:  Pt/visitor educated in [place] for [place], pt unable to tolerate trialing this date 2/2 pain   Shoulder Instructions       General Comments      Pertinent Vitals/ Pain       Pain Assessment: Faces Faces Pain Scale: Hurts even more Pain Location: R thigh Pain Descriptors / Indicators: Grimacing;Guarding Pain Intervention(s): Limited activity within patient's tolerance;Monitored during session;Repositioned;RN gave  pain meds during session  Home Living                                          Prior Functioning/Environment              Frequency  Min 2X/week        Progress Toward Goals  OT Goals(current goals can now be found in the care plan section)  Progress towards OT goals: Progressing toward goals  Acute Rehab OT Goals Patient Stated Goal: To go back to [facility] OT Goal Formulation: With patient Time For Goal Achievement: 01/01/21 Potential to Achieve Goals: Good  Plan Discharge plan remains appropriate;Frequency remains appropriate    Co-evaluation                 AM-PAC OT "6 Clicks" Daily Activity     Outcome Measure   Help from another person eating meals?: None Help from another person taking care of personal grooming?: A Little Help from another person toileting, which includes using toliet, bedpan, or urinal?: A Lot Help from another person bathing (including washing, rinsing, drying)?: A Lot Help from another person to put on and taking off regular upper body clothing?: A Little Help from another person to put on and taking off regular lower body clothing?: A Lot 6 Click Score: 16    End of Session    OT Visit Diagnosis: Other abnormalities of gait and mobility (R26.89);Repeated falls (R29.6);Pain;Other symptoms and signs involving cognitive function Pain - Right/Left: Right   Activity Tolerance Patient limited by pain   Patient Left in bed;with call bell/phone within reach;with bed alarm set;with family/visitor present   Nurse Communication Other (comment) (nurse tech - linens change)        Time: 5053-9767 OT Time Calculation (min): 31 min  Charges: OT General Charges $OT Visit: 1 Visit OT Treatments $Self Care/Home Management : 23-37 mins  Hanley Hays, MPH, MS, OTR/L ascom [phone] 12/19/20, 12:53 PM

## 2020-12-19 NOTE — Discharge Summary (Signed)
Physician Discharge Summary  TATIONNA FULLARD GYK:[id number] DOB: April 21, 1946 DOA: 12/16/2020  PCP: McLean-Scocuzza, Nino Glow, MD  Admit date: 12/16/2020 Discharge date: 12/19/2020  Admitted From: ALF Disposition: SNF  Recommendations for Outpatient Follow-up:  1. Follow up with PCP in 1-2 weeks 2. Follow-up with orthopedic surgery 3. Please obtain BMP/CBC in one week 4. Please follow up on the following pending results: None  Home Health: No Equipment/Devices: Ortho boot, rolling walker Discharge Condition: Stable CODE STATUS: DNR Diet recommendation: Heart Healthy   Brief/Interim Summary: Nia Nathaniel Grantis an 75 y.o.femalewith prior history of CVA, restless leg syndrome, chronic atrial fibrillation on anticoagulation, hypothyroidism, anxiety disorder. At baseline, patient lives at home by self.She is ADL and IADL independent.  She was brought to ER after having a fall at home.  Resulted in right tibial and metatarsal fracture.  Orthopedic was consulted and they are recommending conservative management and think that metatarsal fracture is chronic.  She was provided with Ortho boot which he has to use all the time especially during mobilization.  She will follow-up with them as an outpatient for further recommendations.  Our physical therapist evaluated her and recommending going to rehab before returning ALF.  Patient is being discharged to rehab for further management.  On arrival she was also found to be hypotensive and responded well to 500 cc bolus.  Cardiology was consulted because of a concern of presyncopal/syncopal episode resulted in fall.  No abnormal arrhythmia noted while she is hospital and while interrogating her device by cardiology except a small episode of A. fib but heart rate remains in low 100s.  Echocardiogram without any structural abnormality.  Cardiology does not think that any arrhythmias associated with her fall.  Most likely some cognitive decline and mechanical  issue.  Patient does not remember anything related to that fall.  She was oriented to self only, most likely some underlying dementia.  Patient has a history of atrial fibrillation s/p ablation.  During hospitalization she had rate well controlled.  She was on flecainide and Cardizem and due to softer blood pressure and bradycardia Cardizem was discontinued.  Cardiology was consulted and they are recommending continue with only flecainide and follow-up with EP as an outpatient.  Her home dose of Eliquis was also held initially due to some concern of procedure but when orthopedic decided with conservative management only Eliquis was restarted.  Patient will continue with that and follow-up with her cardiologist.  Patient also has an history of pancreatic cancer s/p pancreatectomy.  She will continue her follow-up with oncology as an outpatient.  Patient has an history of anxiety/depression.  No acute concern during current hospitalization.  She will continue home meds of Cymbalta and as needed Ativan.  Patient will continue rest of her home medications and follow-up with her providers.  Discharge Diagnoses:  Active Problems:   Fall   Ankle fracture   PAF (paroxysmal atrial fibrillation) ([name])   Bradycardia   Dehydration   Anticoagulated   Closed nondisplaced fracture of fifth left metatarsal bone   Right hip pain   Discharge Instructions  Discharge Instructions    Diet - low sodium heart healthy   Complete by: As directed    Increase activity slowly   Complete by: As directed      Allergies as of 12/19/2020      Reactions   Darvon Other (See Comments)   Severe panic attacks   Propoxyphene Other (See Comments)   GI Upset Severe panic attacks GI Upset  Propoxyphene N-acetaminophen Other (See Comments)   Severe panic attacks   Levofloxacin Anxiety, Other (See Comments)   Causes panic attacks.      Medication List    STOP taking these medications   diltiazem 240 MG 24 hr  capsule Commonly known as: CARDIZEM CD   methocarbamol 500 MG tablet Commonly known as: ROBAXIN   oxyCODONE 5 MG immediate release tablet Commonly known as: Oxy IR/ROXICODONE     TAKE these medications   acetaminophen 500 MG tablet Commonly known as: TYLENOL Take 1,000 mg by mouth every 6 (six) hours as needed for mild pain or moderate pain.   amphetamine-dextroamphetamine 10 MG 24 hr capsule Commonly known as: ADDERALL XR Take 1 capsule (10 mg total) by mouth daily.   apixaban 5 MG Tabs tablet Commonly known as: Eliquis Take 1 tablet (5 mg total) by mouth in the morning and at bedtime.   B-complex with vitamin C tablet Take 1 tablet by mouth daily.   blood glucose meter kit and supplies Kit Dispense based on patient and insurance preference. Use up to four times daily as directed. (FOR ICD-9 250.00, 250.01).   CALCIUM 600+D3 PO Take 2 tablets by mouth daily.   CRANBERRY EXTRACT PO Take 2 tablets by mouth every evening.   denosumab 60 MG/ML Soln injection Commonly known as: PROLIA Inject 60 mg into the skin every 6 (six) months.   DULoxetine 60 MG capsule Commonly known as: CYMBALTA Take 1 capsule (60 mg total) by mouth 2 (two) times daily. What changed:   how much to take  when to take this   estradiol 0.1 MG/GM vaginal cream Commonly known as: ESTRACE VAGINAL Apply 0.21m (pea-sized amount)  just inside the vaginal introitus with a finger-tip on Monday, Wednesday and Friday nights.   flecainide 100 MG tablet Commonly known as: TAMBOCOR TAKE 1 TABLET (100 MG TOTAL) BY MOUTH IN THE MORNING AND AT BEDTIME.   gabapentin 100 MG capsule Commonly known as: NEURONTIN Take 2 capsules (200 mg total) by mouth 2 (two) times daily.   levothyroxine 25 MCG tablet Commonly known as: SYNTHROID Take 1 tablet (25 mcg total) by mouth daily before breakfast. 30 minutes before food   LORazepam 0.5 MG tablet Commonly known as: ATIVAN TAKE 1 TABLET (0.5 MG TOTAL) BY MOUTH  EVERY 8 (EIGHT) HOURS. What changed:   when to take this  reasons to take this   melatonin 5 MG Tabs Take 2.5-5 mg by mouth at bedtime as needed (sleep).   multivitamin with minerals Tabs tablet Take 1 tablet by mouth daily.   ondansetron 4 MG disintegrating tablet Commonly known as: ZOFRAN-ODT Take 1 tablet (4 mg total) by mouth every 6 (six) hours as needed for nausea.   pravastatin 10 MG tablet Commonly known as: PRAVACHOL Take 1 tablet (10 mg total) by mouth at bedtime. Take a whole pill   SALT SUBSTITUTES PO Take 1 tablet by mouth daily as needed (hypotension).   timolol 0.5 % ophthalmic solution Commonly known as: TIMOPTIC Place 1 drop into the left eye at bedtime.   traMADol 50 MG tablet Commonly known as: ULTRAM Take 1 tablet (50 mg total) by mouth every 6 (six) hours as needed (mild pain).   vitamin C 500 MG tablet Commonly known as: ASCORBIC ACID Take 1,000 mg by mouth daily.   Vitamin D3 50 MCG (2000 UT) Tabs Take 4,000 Units by mouth daily.   vitamin E 180 MG (400 UNITS) capsule Take 400 Units by mouth daily.  Durable Medical Equipment  (From admission, onward)         Start     Ordered   12/18/20 1051  For home use only DME Walker rolling  Once       Question Answer Comment  Walker: With 5 Inch Wheels   Patient needs a walker to treat with the following condition Tibia and fibula open fracture, right      12/18/20 1050          Follow-up Information    McLean-Scocuzza, Nino Glow, MD. Schedule an appointment as soon as possible for a visit.   Specialty: Internal Medicine Contact information: [address] [place] [address] [phone]        Lovell Sheehan, MD. Schedule an appointment as soon as possible for a visit in 1 week(s).   Specialty: Orthopedic Surgery Contact information: [place] [address] [phone]              Allergies  Allergen Reactions  . Darvon Other (See  Comments)    Severe panic attacks  . Propoxyphene Other (See Comments)    GI Upset Severe panic attacks GI Upset  . Propoxyphene N-Acetaminophen Other (See Comments)    Severe panic attacks  . Levofloxacin Anxiety and Other (See Comments)    Causes panic attacks.    Consultations:  Orthopedic surgery  Cardiology  Procedures/Studies: DG Ankle Complete Right  Result Date: 12/16/2020 CLINICAL DATA:  Multiple falls, ankle swelling and pain EXAM: RIGHT ANKLE - COMPLETE 3+ VIEW COMPARISON:  Radiographs from 07/31/2013 FINDINGS: Oblique fracture of the lateral malleolus with extensive overlying subcutaneous edema. No well-defined fracture of the medial or posterior malleolus. Plafond and talar dome otherwise intact. IMPRESSION: 1. Oblique fracture lateral malleolus representing at least a stage II Weber B fracture. No definite medial malleolar or posterior malleolar fragments identified. Electronically Signed   By: Van Clines M.D.   On: 12/16/2020 16:22   CT Head Wo Contrast  Result Date: 12/16/2020 CLINICAL DATA:  Dizziness and subsequent fall. EXAM: CT HEAD WITHOUT CONTRAST TECHNIQUE: Contiguous axial images were obtained from the base of the skull through the vertex without intravenous contrast. COMPARISON:  November 09, 2014 FINDINGS: Brain: There is mild cerebral atrophy with widening of the extra-axial spaces and ventricular dilatation. There are areas of decreased attenuation within the white matter tracts of the supratentorial brain, consistent with microvascular disease changes. An area of encephalomalacia, with adjacent chronic white matter low attenuation, is seen within the left occipital lobe. Vascular: No hyperdense vessel or unexpected calcification. Skull: Normal. Negative for fracture or focal lesion. Sinuses/Orbits: Mild posterior bilateral ethmoid sinus mucosal thickening is seen. Other: None. IMPRESSION: 1. Generalized cerebral atrophy. 2. Chronic left occipital lobe  infarct. 3. No acute intracranial abnormality. Electronically Signed   By: Virgina Norfolk M.D.   On: 12/16/2020 16:18   CT Cervical Spine Wo Contrast  Result Date: 12/16/2020 CLINICAL DATA:  Dizziness, fall.  Dementia. EXAM: CT CERVICAL SPINE WITHOUT CONTRAST TECHNIQUE: Multidetector CT imaging of the cervical spine was performed without intravenous contrast. Multiplanar CT image reconstructions were also generated. COMPARISON:  10/30/2015 FINDINGS: Alignment: 2 mm degenerative retrolisthesis at C4-5. 2.5 mm degenerative anterolisthesis at C7-T1. Skull base and vertebrae: No fracture or acute bony findings identified. Soft tissues and spinal canal: Unremarkable Disc levels: Foraminal stenosis due to uncinate and facet spurring on the right at C3-4 and C5-6; and on the left at C3-4 and C6-7. Suspected disc bulge at C4-5 without  significant central narrowing of the thecal sac. Upper chest: Unremarkable Other: No supplemental non-categorized findings. IMPRESSION: 1. No acute cervical spine findings. 2. Cervical spondylosis with mild foraminal impingement at C3-4, C5-6, and C6-7. Electronically Signed   By: Van Clines M.D.   On: 12/16/2020 16:20   MR BRAIN WO CONTRAST  Result Date: 12/16/2020 CLINICAL DATA:  Fall EXAM: MRI HEAD WITHOUT CONTRAST TECHNIQUE: Multiplanar, multiecho pulse sequences of the brain and surrounding structures were obtained without intravenous contrast. COMPARISON:  07/23/2010 FINDINGS: Brain: No acute infarct, mass effect or extra-axial collection. No acute or chronic hemorrhage. There is multifocal hyperintense T2-weighted signal within the white matter. Parenchymal volume and CSF spaces are normal. Old left thalamic small vessel infarct and old left occipital lobe infarct. The midline structures are normal. Vascular: Major flow voids are preserved. Skull and upper cervical spine: Normal calvarium and skull base. Visualized upper cervical spine and soft tissues are normal.  Sinuses/Orbits:No paranasal sinus fluid levels or advanced mucosal thickening. No mastoid or middle ear effusion. Normal orbits. IMPRESSION: 1. No acute intracranial abnormality. 2. Old left thalamic small vessel infarct and old left occipital lobe infarct. Electronically Signed   By: Ulyses Jarred M.D.   On: 12/16/2020 19:33   CT CHEST ABDOMEN PELVIS W CONTRAST  Result Date: 12/16/2020 CLINICAL DATA:  Post fall. Hypotension. History of partial pancreatectomy for IPMN. EXAM: CT CHEST, ABDOMEN, AND PELVIS WITH CONTRAST TECHNIQUE: Multidetector CT imaging of the chest, abdomen and pelvis was performed following the standard protocol during bolus administration of intravenous contrast. CONTRAST:  25m OMNIPAQUE IOHEXOL 300 MG/ML  SOLN COMPARISON:  CT abdomen pelvis-08/12/2020; chest CT-03/20/2020; coronary CT-04/13/2019 FINDINGS: CT CHEST FINDINGS Cardiovascular: Normal heart size. No pericardial effusion though a small amount of fluid is seen with the pericardial recess. No evidence of thoracic aortic aneurysm or dissection on this nongated examination. Conventional configuration of the aortic arch. The branch vessels of the aortic arch appear patent throughout their imaged courses. There is a minimal amount of atherosclerotic plaque involving the aortic arch, not resulting in a hemodynamically significant stenosis. Although this examination was not tailored for the evaluation the pulmonary arteries, there are no discrete filling defects within the central pulmonary arterial tree to suggest central pulmonary embolism. Borderline enlarged caliber of the main pulmonary artery measuring 32 mm in diameter. Mediastinum/Nodes: Mildly prominent right hilar and mediastinal lymph nodes are grossly unchanged compared to the 04/2019 cardiac CT with index right infrahilar lymph node measuring approximately 1.3 cm in greatest short axis diameter (image 40, series 2) and precarinal lymph node measuring 0.9 cm (image 27, series  2, similar to chest CT 03/2020, nonspecific though presumably reactive in etiology. No bulky mediastinal or axillary lymphadenopathy. Lungs/Pleura: Minimal dependent subpleural ground-glass atelectasis. Subsegmental atelectasis involving the right lower lobe and basilar segment of the lingula. No discrete focal airspace opacities. No pleural effusion or pneumothorax. The central pulmonary airways appear widely patent. No discrete pulmonary nodules. Musculoskeletal: No acute or aggressive osseous abnormalities. Regional soft tissues appear normal. Normal appearance of the thyroid gland. _________________________________________________________ CT ABDOMEN PELVIS FINDINGS Hepatobiliary: Normal hepatic contour. There is a minimal amount of focal fatty infiltration adjacent to the fissure for ligamentum teres. No discrete hepatic lesions. Normal appearance of the gallbladder given degree of distention. No radiopaque gallstones. Suspected minimal amount of periportal edema without definitive intra or extrahepatic biliary duct dilatation. No ascites. Pancreas: Sequela of distal pancreatectomy with unchanged approximately 2.8 x 1.5 cm fluid collection at the operative site (image 61, series  2), previously, 3.1 x 1.9 cm. Questioned 2.0 cm cystic lesion within the uncinate process of the pancreas is suboptimally evaluated on the present examination (image 67, series 2). No peripancreatic stranding. Spleen: Normal appearance of the spleen. Adrenals/Urinary Tract: There is symmetric enhancement of the bilateral kidneys. No definite evidence of nephrolithiasis on this postcontrast examination. Subcentimeter right-sided renal lesions are too small to adequately characterize though favored to represent renal cysts. No discrete left-sided renal lesions. No urine obstruction or perinephric stranding. Normal appearance the bilateral adrenal glands. Normal appearance of the urinary bladder given degree of distention. Stomach/Bowel:  Large colonic stool burden without evidence of enteric obstruction. Feculent material is seen within the distal small bowel. The normal appearance of the retrocecal appendix. Scattered colonic diverticulosis without evidence superimposed acute diverticulitis. No discrete areas of bowel wall thickening. No definitive hiatal hernia. No pneumoperitoneum, pneumatosis or portal venous gas. Vascular/Lymphatic: Minimal amount of atherosclerotic plaque within a normal caliber abdominal aorta. The major branch vessels of the abdominal aorta appear patent on this non CTA examination. Redemonstrated postoperative occlusion the SMV the with several suspected gastric varices, suboptimally evaluated secondary to phase of enhancement. No bulky retroperitoneal, mesenteric, pelvic or inguinal lymphadenopathy. Reproductive: Post hysterectomy. No discrete adnexal lesions. No free fluid the pelvic cul-de-sac. Other: Tiny mesenteric fat containing periumbilical hernia. Musculoskeletal: No acute or aggressive osseous abnormalities. Moderate to severe multilevel lumbar spine DDD, worse at L2-L3 and L5-S1 with disc space height loss, endplate irregularity and sclerosis. Note is made of a right-sided L5-S1 assimilation joint. Mild scoliotic curvature of the thoracolumbar spine with dominant caudal component convex the left, potentially degenerative in etiology. IMPRESSION: 1. No acute traumatic findings within the chest, abdomen or pelvis. 2. Stable sequela of distal pancreatectomy with unchanged approximately 2.8 x 1.6 cm fluid collection adjacent to the operative site, nonspecific though likely representative of a non enlarging postprocedural pseudocyst. 3. Postoperative ligation of the splenic vein with several small gastric varices, suboptimally evaluated secondary to phase of enhancement. If there is clinical concern for gastric variceal hemorrhage, further evaluation with endoscopy could be performed as indicated. 4. Previously  questioned 1.9 cm minimally complex cystic lesion within the uncinate process of the pancreas is suboptimally evaluated on this non pancreatic protocol examination. Follow-up MRI in July of this year was recommended on previous diagnostic CT performed 08/12/2020. 5. Suspected periportal edema. Correlation with LFTs is advised. 6. Large colonic stool burden with fecalization of the distal small bowel as could be seen in the setting of constipation. 7. Colonic diverticulosis without evidence of acute diverticulitis. Critical Value/emergent results were called by telephone at the time of interpretation on 12/16/2020 at 4:39 pm to provider [facility] , who verbally acknowledged these results. Electronically Signed   By: Sandi Mariscal M.D.   On: 12/16/2020 16:41   CT T-SPINE NO CHARGE  Result Date: 12/16/2020 CLINICAL DATA:  [place]. EXAM: CT THORACIC AND LUMBAR SPINE WITHOUT CONTRAST TECHNIQUE: Multidetector CT imaging of the thoracic and lumbar spine was performed without contrast. Multiplanar CT image reconstructions were also generated. COMPARISON:  None. FINDINGS: CT THORACIC SPINE FINDINGS Alignment: Normal Vertebrae: Intact. No compression fracture. No bone lesion. The facets are normally aligned. No facet or laminar fractures or pedicle fractures. Paraspinal and other soft tissues: No significant paraspinal findings. Disc levels: No large thoracic disc protrusions or spinal stenosis. CT LUMBAR SPINE FINDINGS Segmentation: There are five lumbar type vertebral bodies. The last full intervertebral disc space is labeled L5-S1. Alignment: Left convex scoliosis and associated  moderate degenerative lumbar spondylosis with multilevel disc disease and facet disease, most significant at L2-3 and L5-S1. Vertebrae: No acute lumbar spine fracture. The vertebral bodies are maintained. Advanced facet disease but no pars defects or acute pars fractures. Paraspinal and other soft tissues: No significant paraspinal or  retroperitoneal findings. Disc levels: No large lumbar disc protrusions, significant spinal or foraminal stenosis. IMPRESSION: 1. Normal alignment of the thoracic and lumbar vertebral bodies and no acute fracture. 2. Left convex scoliosis and associated moderate degenerative lumbar spondylosis with multilevel disc disease and facet disease, most significant at L2-3 and L5-S1. 3. No large disc protrusions, significant spinal or foraminal stenosis. Electronically Signed   By: Marijo Sanes M.D.   On: 12/16/2020 16:05   CT L-SPINE NO CHARGE  Result Date: 12/16/2020 CLINICAL DATA:  [place]. EXAM: CT THORACIC AND LUMBAR SPINE WITHOUT CONTRAST TECHNIQUE: Multidetector CT imaging of the thoracic and lumbar spine was performed without contrast. Multiplanar CT image reconstructions were also generated. COMPARISON:  None. FINDINGS: CT THORACIC SPINE FINDINGS Alignment: Normal Vertebrae: Intact. No compression fracture. No bone lesion. The facets are normally aligned. No facet or laminar fractures or pedicle fractures. Paraspinal and other soft tissues: No significant paraspinal findings. Disc levels: No large thoracic disc protrusions or spinal stenosis. CT LUMBAR SPINE FINDINGS Segmentation: There are five lumbar type vertebral bodies. The last full intervertebral disc space is labeled L5-S1. Alignment: Left convex scoliosis and associated moderate degenerative lumbar spondylosis with multilevel disc disease and facet disease, most significant at L2-3 and L5-S1. Vertebrae: No acute lumbar spine fracture. The vertebral bodies are maintained. Advanced facet disease but no pars defects or acute pars fractures. Paraspinal and other soft tissues: No significant paraspinal or retroperitoneal findings. Disc levels: No large lumbar disc protrusions, significant spinal or foraminal stenosis. IMPRESSION: 1. Normal alignment of the thoracic and lumbar vertebral bodies and no acute fracture. 2. Left convex scoliosis and associated  moderate degenerative lumbar spondylosis with multilevel disc disease and facet disease, most significant at L2-3 and L5-S1. 3. No large disc protrusions, significant spinal or foraminal stenosis. Electronically Signed   By: Marijo Sanes M.D.   On: 12/16/2020 16:05   DG Foot 2 Views Left  Result Date: 12/16/2020 CLINICAL DATA:  Status post fall. EXAM: LEFT FOOT - 2 VIEW COMPARISON:  None. FINDINGS: A small fracture deformity of indeterminate age is seen involving the lateral aspect of the base of the first left metatarsal. There is no evidence of arthropathy or other focal bone abnormality. Mild soft tissue swelling is seen along the lateral aspect of the left ankle. IMPRESSION: Small fracture of the first left metatarsal of indeterminate age. CT correlation is recommended. Electronically Signed   By: Virgina Norfolk M.D.   On: 12/16/2020 17:24   DG Foot 2 Views Right  Result Date: 12/16/2020 CLINICAL DATA:  Right foot pain and swelling after multiple falls. EXAM: RIGHT FOOT - 2 VIEW COMPARISON:  None. FINDINGS: Distal right tibial fracture is noted. No other bony abnormality is noted. There is no evidence of arthropathy or other focal bone abnormality. Soft tissues are unremarkable. IMPRESSION: Mildly displaced distal right tibial fracture. Electronically Signed   By: Marijo Conception M.D.   On: 12/16/2020 16:38   ECHOCARDIOGRAM COMPLETE  Result Date: 12/18/2020    ECHOCARDIOGRAM REPORT   Patient Name:   LUCYANN ROMANO Date of Exam: 12/17/2020 Medical Rec #:  [id number]       Height:       69.0 in Accession #:  [id number]      Weight:       160.0 lb Date of Birth:  18-Jan-1946      BSA:          1.879 m Patient Age:    75 years        BP:           123/68 mmHg Patient Gender: F               HR:           77 bpm. Exam Location:  [facility] Procedure: 2D Echo, Cardiac Doppler and Color Doppler Indications:     R55 Syncope  History:         Patient has prior history of Echocardiogram examinations, most                   recent 04/11/2019. Ventricular Tachycardia. Thyroid disease.                  Stroke. Atrial fibrillation.  Sonographer:     Wilford Sports Rodgers-Jones Referring Phys:  [id number] [place] Diagnosing Phys: Nelva Bush MD IMPRESSIONS  1. Left ventricular ejection fraction, by estimation, is 55 to 60%. The left ventricle has normal function. The left ventricle has no regional wall motion abnormalities. Left ventricular diastolic parameters were normal.  2. Right ventricular systolic function is normal. The right ventricular size is normal. Tricuspid regurgitation signal is inadequate for assessing PA pressure.  3. The mitral valve is degenerative. Trivial mitral valve regurgitation. No evidence of mitral stenosis.  4. The aortic valve is tricuspid. There is mild thickening of the aortic valve. Aortic valve regurgitation is not visualized. Mild aortic valve sclerosis is present, with no evidence of aortic valve stenosis.  5. Aortic dilatation noted. There is mild dilatation of the aortic root, measuring 39 mm. There is mild dilatation of the ascending aorta, measuring 37 mm.  6. The inferior vena cava is dilated in size with <50% respiratory variability, suggesting right atrial pressure of 15 mmHg. FINDINGS  Left Ventricle: Left ventricular ejection fraction, by estimation, is 55 to 60%. The left ventricle has normal function. The left ventricle has no regional wall motion abnormalities. The left ventricular internal cavity size was normal in size. There is  no left ventricular hypertrophy. Left ventricular diastolic parameters were normal. Right Ventricle: The right ventricular size is normal. Right vetricular wall thickness was not well visualized. Right ventricular systolic function is normal. Tricuspid regurgitation signal is inadequate for assessing PA pressure. Left Atrium: Left atrial size was normal in size. Right Atrium: Right atrial size was normal in size. Pericardium: There is no evidence of  pericardial effusion. Mitral Valve: The mitral valve is degenerative in appearance. There is mild thickening of the mitral valve leaflet(s). There is mild calcification of the mitral valve leaflet(s). Trivial mitral valve regurgitation. No evidence of mitral valve stenosis. Tricuspid Valve: The tricuspid valve is normal in structure. Tricuspid valve regurgitation is not demonstrated. Aortic Valve: The aortic valve is tricuspid. There is mild thickening of the aortic valve. Aortic valve regurgitation is not visualized. Mild aortic valve sclerosis is present, with no evidence of aortic valve stenosis. Pulmonic Valve: The pulmonic valve was not well visualized. Pulmonic valve regurgitation is not visualized. No evidence of pulmonic stenosis. Aorta: Aortic dilatation noted. There is mild dilatation of the aortic root, measuring 39 mm. There is mild dilatation of the ascending aorta, measuring 37 mm. Pulmonary Artery: The pulmonary artery is not well  seen. Venous: The inferior vena cava is dilated in size with less than 50% respiratory variability, suggesting right atrial pressure of 15 mmHg. IAS/Shunts: The interatrial septum was not well visualized.  LEFT VENTRICLE PLAX 2D LVIDd:         5.17 cm  Diastology LVIDs:         3.14 cm  LV e' medial:    8.16 cm/s LV PW:         0.82 cm  LV E/e' medial:  10.0 LV IVS:        0.98 cm  LV e' lateral:   11.30 cm/s LVOT diam:     2.10 cm  LV E/e' lateral: 7.2 LV SV:         68 LV SV Index:   36 LVOT Area:     3.46 cm  RIGHT VENTRICLE             IVC RV Basal diam:  3.73 cm     IVC diam: 2.33 cm RV S prime:     17.30 cm/s TAPSE (M-mode): 3.3 cm LEFT ATRIUM             Index       RIGHT ATRIUM           Index LA diam:        4.90 cm 2.61 cm/m  RA Area:     12.40 cm LA Vol (A2C):   47.5 ml 25.28 ml/m RA Volume:   30.30 ml  16.12 ml/m LA Vol (A4C):   44.6 ml 23.74 ml/m LA Biplane Vol: 46.0 ml 24.48 ml/m  AORTIC VALVE LVOT Vmax:   98.55 cm/s LVOT Vmean:  68.050 cm/s LVOT VTI:     0.196 m  AORTA Ao Root diam: 3.90 cm Ao Asc diam:  3.70 cm MITRAL VALVE MV Area (PHT): 2.99 cm    SHUNTS MV Decel Time: 254 msec    Systemic VTI:  0.20 m MV E velocity: 81.50 cm/s  Systemic Diam: 2.10 cm MV A velocity: 73.80 cm/s MV E/A ratio:  1.10 Nelva Bush MD Electronically signed by Nelva Bush MD Signature Date/Time: 12/18/2020/8:19:30 AM    Final    CUP PACEART REMOTE DEVICE CHECK  Result Date: 11/25/2020 ILR summary report received. Battery status OK. Normal device function. No new symptom, tachy, brady, or pause episodes. 2 new AF episodes. Longest episode 18 mins,  EGM suggests SR with ectopy. Burden 0.1%. Monthly summary reports and ROV/PRN. LH  DG HIP UNILAT WITH PELVIS 2-3 VIEWS RIGHT  Result Date: 12/18/2020 CLINICAL DATA:  Right hip pain after fall 2 days ago. EXAM: DG HIP (WITH OR WITHOUT PELVIS) 2-3V RIGHT COMPARISON:  None. FINDINGS: There is no evidence of hip fracture or dislocation. There is no evidence of arthropathy or other focal bone abnormality. IMPRESSION: Negative. Electronically Signed   By: Marijo Conception M.D.   On: 12/18/2020 14:35     Subjective: Patient was seen and examined today during morning rounds.  POA is at bedside.  Per patient each day is getting better than before.  Pain is controlled with current management.  Discharge Exam: Vitals:   12/19/20 0808 12/19/20 1310  BP: 113/60 112/63  Pulse: 77 78  Resp: 16 16  Temp: 98.1 F (36.7 C) 98.3 F (36.8 C)  SpO2: 99% 97%   Vitals:   12/19/20 0040 12/19/20 0511 12/19/20 0808 12/19/20 1310  BP: 113/66 116/67 113/60 112/63  Pulse: 74 73 77 78  Resp: 15 16  16 16  Temp: 98.4 F (36.9 C) 98.5 F (36.9 C) 98.1 F (36.7 C) 98.3 F (36.8 C)  TempSrc: Oral Oral    SpO2: 96% 100% 99% 97%  Weight:      Height:        General: Pt is alert, awake, not in acute distress Cardiovascular: RRR, S1/S2 +, no rubs, no gallops Respiratory: CTA bilaterally, no wheezing, no rhonchi Abdominal: Soft, NT,  ND, bowel sounds + Extremities: no edema, no cyanosis, right lower extremity with a Ortho boot   The results of significant diagnostics from this hospitalization (including imaging, microbiology, ancillary and laboratory) are listed below for reference.    Microbiology: Recent Results (from the past 240 hour(s))  Resp Panel by RT-PCR (Flu A&B, Covid) Nasopharyngeal Swab     Status: None   Collection Time: 12/16/20  3:19 PM   Specimen: Nasopharyngeal Swab; Nasopharyngeal(NP) swabs in vial transport medium  Result Value Ref Range Status   SARS Coronavirus 2 by RT PCR NEGATIVE NEGATIVE Final    Comment: (NOTE) SARS-CoV-2 target nucleic acids are NOT DETECTED.  The SARS-CoV-2 RNA is generally detectable in upper respiratory specimens during the acute phase of infection. The lowest concentration of SARS-CoV-2 viral copies this assay can detect is 138 copies/mL. A negative result does not preclude SARS-Cov-2 infection and should not be used as the sole basis for treatment or other patient management decisions. A negative result may occur with  improper specimen collection/handling, submission of specimen other than nasopharyngeal swab, presence of viral mutation(s) within the areas targeted by this assay, and inadequate number of viral copies(<138 copies/mL). A negative result must be combined with clinical observations, patient history, and epidemiological information. The expected result is Negative.  Fact Sheet for Patients:  EntrepreneurPulse.com.au  Fact Sheet for Healthcare Providers:  IncredibleEmployment.be  This test is no t yet approved or cleared by the [place] FDA and  has been authorized for detection and/or diagnosis of SARS-CoV-2 by FDA under an Emergency Use Authorization (EUA). This EUA will remain  in effect (meaning this test can be used) for the duration of the COVID-19 declaration under Section 564(b)(1) of the Act,  21 U.S.C.section 360bbb-3(b)(1), unless the authorization is terminated  or revoked sooner.       Influenza A by PCR NEGATIVE NEGATIVE Final   Influenza B by PCR NEGATIVE NEGATIVE Final    Comment: (NOTE) The Xpert Xpress SARS-CoV-2/FLU/RSV plus assay is intended as an aid in the diagnosis of influenza from Nasopharyngeal swab specimens and should not be used as a sole basis for treatment. Nasal washings and aspirates are unacceptable for Xpert Xpress SARS-CoV-2/FLU/RSV testing.  Fact Sheet for Patients: EntrepreneurPulse.com.au  Fact Sheet for Healthcare Providers: IncredibleEmployment.be  This test is not yet approved or cleared by the [place] FDA and has been authorized for detection and/or diagnosis of SARS-CoV-2 by FDA under an Emergency Use Authorization (EUA). This EUA will remain in effect (meaning this test can be used) for the duration of the COVID-19 declaration under Section 564(b)(1) of the Act, 21 U.S.C. section 360bbb-3(b)(1), unless the authorization is terminated or revoked.  Performed at [facility], [address] [place], [place] [address]   Urine Culture     Status: Abnormal (Preliminary result)   Collection Time: 12/17/20 10:30 AM   Specimen: Urine, Random  Result Value Ref Range Status   Specimen Description   Final    URINE, RANDOM Performed at [facility], [place], [place]  [address]    Special Requests   Final    NONE Performed at [facility], [address] [place], [place] [address]    Culture (A)  Final    >=100,000 COLONIES/mL ESCHERICHIA COLI SUSCEPTIBILITIES TO FOLLOW Performed at [facility], [place] [address], [place], [place] [address]    Report Status PENDING  Incomplete     Labs: BNP (last 3 results) No results for input(s): BNP in the last 8760 hours. Basic Metabolic Panel: Recent Labs  Lab 12/16/20 1519 12/17/20 0620  NA 136  138  K 4.6 3.7  CL 104 105  CO2 23 26  GLUCOSE 130* 105*  BUN 40* 22  CREATININE 0.94 0.56  CALCIUM 9.0 8.3*  MG 2.1  --    Liver Function Tests: Recent Labs  Lab 12/16/20 1519  AST 31  ALT 26  ALKPHOS 52  BILITOT 0.7  PROT 6.4*  ALBUMIN 3.7   No results for input(s): LIPASE, AMYLASE in the last 168 hours. No results for input(s): AMMONIA in the last 168 hours. CBC: Recent Labs  Lab 12/16/20 1519 12/17/20 0620  WBC 9.5 5.6  NEUTROABS 6.8  --   HGB 11.4* 11.1*  HCT 34.6* 33.4*  MCV 91.1 91.0  PLT 258 222   Cardiac Enzymes: No results for input(s): CKTOTAL, CKMB, CKMBINDEX, TROPONINI in the last 168 hours. BNP: Invalid input(s): POCBNP CBG: No results for input(s): GLUCAP in the last 168 hours. D-Dimer No results for input(s): DDIMER in the last 72 hours. Hgb A1c No results for input(s): HGBA1C in the last 72 hours. Lipid Profile No results for input(s): CHOL, HDL, LDLCALC, TRIG, CHOLHDL, LDLDIRECT in the last 72 hours. Thyroid function studies No results for input(s): TSH, T4TOTAL, T3FREE, THYROIDAB in the last 72 hours.  Invalid input(s): FREET3 Anemia work up No results for input(s): VITAMINB12, FOLATE, FERRITIN, TIBC, IRON, RETICCTPCT in the last 72 hours. Urinalysis    Component Value Date/Time   COLORURINE YELLOW (A) 12/17/2020 1030   APPEARANCEUR HAZY (A) 12/17/2020 1030   APPEARANCEUR Cloudy (A) 05/25/2018 1414   LABSPEC 1.018 12/17/2020 1030   LABSPEC 1.020 11/07/2014 1539   PHURINE 6.0 12/17/2020 1030   GLUCOSEU NEGATIVE 12/17/2020 1030   GLUCOSEU Negative 11/07/2014 1539   HGBUR NEGATIVE 12/17/2020 1030   HGBUR trace-lysed 06/06/2010 1000   BILIRUBINUR NEGATIVE 12/17/2020 1030   BILIRUBINUR neg 03/29/2019 1504   BILIRUBINUR Negative 05/25/2018 1414   BILIRUBINUR Negative 11/07/2014 1539   KETONESUR NEGATIVE 12/17/2020 1030   PROTEINUR NEGATIVE 12/17/2020 1030   UROBILINOGEN 0.2 03/29/2019 1504   UROBILINOGEN 0.2 06/06/2010 1000    NITRITE NEGATIVE 12/17/2020 1030   LEUKOCYTESUR LARGE (A) 12/17/2020 1030   LEUKOCYTESUR Trace 11/07/2014 1539   Sepsis Labs Invalid input(s): PROCALCITONIN,  WBC,  LACTICIDVEN Microbiology Recent Results (from the past 240 hour(s))  Resp Panel by RT-PCR (Flu A&B, Covid) Nasopharyngeal Swab     Status: None   Collection Time: 12/16/20  3:19 PM   Specimen: Nasopharyngeal Swab; Nasopharyngeal(NP) swabs in vial transport medium  Result Value Ref Range Status   SARS Coronavirus 2 by RT PCR NEGATIVE NEGATIVE Final    Comment: (NOTE) SARS-CoV-2 target nucleic acids are NOT DETECTED.  The SARS-CoV-2 RNA is generally detectable in upper respiratory specimens during the acute phase of infection. The lowest concentration of SARS-CoV-2 viral copies this assay can detect is 138 copies/mL. A negative result does not preclude SARS-Cov-2 infection and should not be used as the sole basis for treatment  or other patient management decisions. A negative result may occur with  improper specimen collection/handling, submission of specimen other than nasopharyngeal swab, presence of viral mutation(s) within the areas targeted by this assay, and inadequate number of viral copies(<138 copies/mL). A negative result must be combined with clinical observations, patient history, and epidemiological information. The expected result is Negative.  Fact Sheet for Patients:  EntrepreneurPulse.com.au  Fact Sheet for Healthcare Providers:  IncredibleEmployment.be  This test is no t yet approved or cleared by the [place] FDA and  has been authorized for detection and/or diagnosis of SARS-CoV-2 by FDA under an Emergency Use Authorization (EUA). This EUA will remain  in effect (meaning this test can be used) for the duration of the COVID-19 declaration under Section 564(b)(1) of the Act, 21 U.S.C.section 360bbb-3(b)(1), unless the authorization is terminated  or revoked  sooner.       Influenza A by PCR NEGATIVE NEGATIVE Final   Influenza B by PCR NEGATIVE NEGATIVE Final    Comment: (NOTE) The Xpert Xpress SARS-CoV-2/FLU/RSV plus assay is intended as an aid in the diagnosis of influenza from Nasopharyngeal swab specimens and should not be used as a sole basis for treatment. Nasal washings and aspirates are unacceptable for Xpert Xpress SARS-CoV-2/FLU/RSV testing.  Fact Sheet for Patients: EntrepreneurPulse.com.au  Fact Sheet for Healthcare Providers: IncredibleEmployment.be  This test is not yet approved or cleared by the [place] FDA and has been authorized for detection and/or diagnosis of SARS-CoV-2 by FDA under an Emergency Use Authorization (EUA). This EUA will remain in effect (meaning this test can be used) for the duration of the COVID-19 declaration under Section 564(b)(1) of the Act, 21 U.S.C. section 360bbb-3(b)(1), unless the authorization is terminated or revoked.  Performed at [facility], [address] [place], [place] [address]   Urine Culture     Status: Abnormal (Preliminary result)   Collection Time: 12/17/20 10:30 AM   Specimen: Urine, Random  Result Value Ref Range Status   Specimen Description   Final    URINE, RANDOM Performed at [facility], [address] [place], [place] [address]    Special Requests   Final    NONE Performed at [facility], [address] [place], [place] [address]    Culture (A)  Final    >=100,000 COLONIES/mL ESCHERICHIA COLI SUSCEPTIBILITIES TO FOLLOW Performed at [facility], [place] [address] [place], [place] [address]    Report Status PENDING  Incomplete    Time coordinating discharge: Over 30 minutes  SIGNED:  Lorella Nimrod, MD  [facility] 12/19/2020, 1:58 PM  If 7PM-7AM, please contact night-coverage [email or web address]  This record has been created using Systems analyst.  Errors have been sought and corrected,but may not always be located. Such creation errors do not reflect on the standard of care.

## 2020-12-20 ENCOUNTER — Telehealth: Payer: Self-pay | Admitting: Internal Medicine

## 2020-12-20 ENCOUNTER — Other Ambulatory Visit: Payer: Self-pay | Admitting: Internal Medicine

## 2020-12-20 LAB — URINE CULTURE: Culture: >=100000 — AB

## 2020-12-20 NOTE — Telephone Encounter (Signed)
New message    Calling to let you know patient is now at rehab (admitted yesterday) and she is due a home remote loop recorder check on Monday.  She does not have her box there and not sure how long she will be in rehab.  Call them if you need them to do something about her remote check.

## 2020-12-20 NOTE — Telephone Encounter (Signed)
Successful telephone encounter to [facility] to follow up on patient admission and remote monitoring of loop recorder. Informed Stanton Kidney that patient had medication changes during hospitalization that may be reflective in patients heart rhythm and rate. Expressed importance of patient having remote transmitter at bedside. Stanton Kidney states she will discuss with POA and have monitor brought to patients bedside.   This RN attempted to also contact Ignacia Palma (Dual POA) at given number without success. Unable to leave VM message as "call cannot be completed at this time".

## 2020-12-20 NOTE — Telephone Encounter (Signed)
Does pt need hospital f/u televisit or in person E coli uti from hospital urine culture   Is someone hospitalist going to treat  Her?   Hospital sent me urine culture but I did not evaluate her for this and if symptomatic needs treatment and APPT  If needed schedule   [facility] recurrent uti  Dr. Gayland Curry

## 2020-12-23 ENCOUNTER — Telehealth: Payer: Self-pay | Admitting: Internal Medicine

## 2020-12-23 ENCOUNTER — Ambulatory Visit (INDEPENDENT_AMBULATORY_CARE_PROVIDER_SITE_OTHER): Payer: PPO

## 2020-12-23 DIAGNOSIS — I480 Paroxysmal atrial fibrillation: Secondary | ICD-10-CM

## 2020-12-23 NOTE — Telephone Encounter (Signed)
Lorella Nimrod, MD  McLean-Scocuzza, Nino Glow, MD Patient did not had any urinary symptoms, we decided not to treat.    Urine culture 12/17/20 hospital MD note

## 2020-12-23 NOTE — Telephone Encounter (Signed)
Noted no culture due to no symptoms or UTI per hospital Dr. Sherol Dade  Dr. Olivia Mackie McLean-Scocuzza

## 2020-12-23 NOTE — Telephone Encounter (Signed)
Patient is currently in Rehab

## 2020-12-25 NOTE — Telephone Encounter (Signed)
Pt in Rehab at [facility]; there are administering meds at facility. Will deny this refill since in rehab. Also, messages have been left prior to her being in the Hospital to call back.

## 2020-12-26 ENCOUNTER — Other Ambulatory Visit: Payer: Self-pay

## 2020-12-26 ENCOUNTER — Ambulatory Visit (INDEPENDENT_AMBULATORY_CARE_PROVIDER_SITE_OTHER): Payer: PPO | Admitting: Internal Medicine

## 2020-12-26 ENCOUNTER — Encounter: Payer: Self-pay | Admitting: Internal Medicine

## 2020-12-26 VITALS — BP 100/68 | HR 93 | Temp 97.4°F | Ht 69.0 in | Wt 160.0 lb

## 2020-12-26 DIAGNOSIS — T07XXXA Unspecified multiple injuries, initial encounter: Secondary | ICD-10-CM

## 2020-12-26 DIAGNOSIS — Z711 Person with feared health complaint in whom no diagnosis is made: Secondary | ICD-10-CM

## 2020-12-26 DIAGNOSIS — E039 Hypothyroidism, unspecified: Secondary | ICD-10-CM | POA: Diagnosis not present

## 2020-12-26 DIAGNOSIS — S92901D Unspecified fracture of right foot, subsequent encounter for fracture with routine healing: Secondary | ICD-10-CM | POA: Diagnosis not present

## 2020-12-26 DIAGNOSIS — E785 Hyperlipidemia, unspecified: Secondary | ICD-10-CM

## 2020-12-26 DIAGNOSIS — N3000 Acute cystitis without hematuria: Secondary | ICD-10-CM | POA: Diagnosis not present

## 2020-12-26 DIAGNOSIS — I639 Cerebral infarction, unspecified: Secondary | ICD-10-CM

## 2020-12-26 DIAGNOSIS — G250 Essential tremor: Secondary | ICD-10-CM

## 2020-12-26 DIAGNOSIS — R413 Other amnesia: Secondary | ICD-10-CM | POA: Diagnosis not present

## 2020-12-26 DIAGNOSIS — S92909D Unspecified fracture of unspecified foot, subsequent encounter for fracture with routine healing: Secondary | ICD-10-CM | POA: Diagnosis not present

## 2020-12-26 DIAGNOSIS — Z748 Other problems related to care provider dependency: Secondary | ICD-10-CM

## 2020-12-26 MED ORDER — NITROFURANTOIN MONOHYD MACRO 100 MG PO CAPS: 100.0000 mg | ORAL_CAPSULE | Freq: Two times a day (BID) | ORAL | 0 refills | Status: DC

## 2020-12-26 MED ORDER — TRAMADOL HCL 50 MG PO TABS: 50.0000 mg | ORAL_TABLET | Freq: Two times a day (BID) | ORAL | 0 refills | Status: DC | PRN

## 2020-12-26 NOTE — Patient Instructions (Signed)
Neurology referral decision making capacity and ortho referral to [facility]

## 2020-12-26 NOTE — Progress Notes (Addendum)
Chief Complaint  Patient presents with  . Follow-up  . Form Completion    HFU with friend and POA Monica Whitaker 1.[place] 5/16-5/19 with fall x 2, confusion and c/w decline in memory (friend Monica Whitaker with her today states pt had food from 2017 and medication bottles everywhere in the house) with h/o stoke and leg and foot fractures, E coli UTI (not tx'ed in the hospital) she is currently in SNF at [facility] and needs neurology to decide if able to be in independent living I.e has decision making capacity or needs Monica Whitaker point at [facility] currently in SNF after fall with fracture. Her friend who is POA [name] (has paperwork today scanned to chart) and other friend Monica Whitaker on [place] form has dementia and not able to be POA and pt is widowed  Mid to moderate Pain is in pain with right leg fractures and has fractures b/l and in brace today and needs urgent [facility] has appt 01/06/21 but wants appt sooner  Spoke with Monica Whitaker today [phone] and nurse at [facility] repeat urine in 2 weeks and try to pill pack medications all meds change to total care  EXAM: RIGHT ANKLE - COMPLETE 3+ VIEW  COMPARISON:  Radiographs from 07/31/2013  FINDINGS: Oblique fracture of the lateral malleolus with extensive overlying subcutaneous edema. No well-defined fracture of the medial or posterior malleolus. Plafond and talar dome otherwise intact.  IMPRESSION: 1. Oblique fracture lateral malleolus representing at least a stage II Weber B fracture. No definite medial malleolar or posterior malleolar fragments identified.   Electronically Signed   By: Van Clines M.D.   On: 12/16/2020 16:22  CLINICAL DATA:  Multiple falls, ankle swelling and pain  EXAM: RIGHT ANKLE - COMPLETE 3+ VIEW  COMPARISON:  Radiographs from 07/31/2013  FINDINGS: Oblique fracture of the lateral malleolus with extensive overlying subcutaneous edema. No well-defined fracture of the medial or posterior  malleolus. Plafond and talar dome otherwise intact.  IMPRESSION: 1. Oblique fracture lateral malleolus representing at least a stage II Weber B fracture. No definite medial malleolar or posterior malleolar fragments identified.   Electronically Signed   By: Van Clines M.D.   On: 12/16/2020 16:22  EXAM: LEFT FOOT - 2 VIEW  COMPARISON:  None.  FINDINGS: A small fracture deformity of indeterminate age is seen involving the lateral aspect of the base of the first left metatarsal. There is no evidence of arthropathy or other focal bone abnormality. Mild soft tissue swelling is seen along the lateral aspect of the left ankle.  IMPRESSION: Small fracture of the first left metatarsal of indeterminate age. CT correlation is recommended.   Electronically Signed   By: Virgina Norfolk M.D.   On: 12/16/2020 17:24  MRI brain 12/16/20  FINDINGS: Brain: No acute infarct, mass effect or extra-axial collection. No acute or chronic hemorrhage. There is multifocal hyperintense T2-weighted signal within the white matter. Parenchymal volume and CSF spaces are normal. Old left thalamic small vessel infarct and old left occipital lobe infarct. The midline structures are normal.  Vascular: Major flow voids are preserved.  Skull and upper cervical spine: Normal calvarium and skull base. Visualized upper cervical spine and soft tissues are normal.  Sinuses/Orbits:No paranasal sinus fluid levels or advanced mucosal thickening. No mastoid or middle ear effusion. Normal orbits.  IMPRESSION: 1. No acute intracranial abnormality. 2. Old left thalamic small vessel infarct and old left occipital lobe infarct.   Electronically Signed   By: Lennette Bihari  Collins Scotland M.D.   On: 12/16/2020 19:33  2. Code status noted DNR today pt is unsure if wants to be DNR and I disc with Monica Whitaker and gave pt a most form to review and fill out and return to [place] and PCP. She  sounds as if she would like partial code 3. transportation issues since not driving contacted [place] and informed needed this  4. E coli UTI tx macrobid and repeat culture in 2 weeks  5. SW to help with pill pack change meds [facility] to [facility] and pill pack    Of note pt refuses to see Dr. Silvio Pate at [facility] will see Monica Whitaker if I am not available but will need to see me per social work in 30 days after hosp, discharge, 60 days and 90 days advised pt will have to be in person or may see another college    Review of Systems  Constitutional: Positive for weight loss.       Down 11 lbs  HENT: Negative for hearing loss.   Eyes: Negative for blurred vision.  Respiratory: Negative for shortness of breath.   Cardiovascular: Negative for chest pain.  Musculoskeletal: Positive for falls and joint pain.  Skin: Negative for rash.  Neurological: Positive for tremors.  Psychiatric/Behavioral: Positive for memory loss. The patient is nervous/anxious.    Past Medical History:  Diagnosis Date  . Actinic keratosis 12/20/2012   left nasal tip  . Anxiety    controlled with meds  . Anxiety   . Arrhythmia   . Atrial fibrillation ([name])    paroxysmal, failed medical therapy with flecainide,  NSVT with tikosyn  . Basal cell carcinoma 12/20/2012   left proximal mandible  . Bruises easily   . CVA (cerebral vascular accident) ([place]) 12/2007  . Depression    medically controlled   . Glaucoma    also with macular holes AE Dr. Thomasene Ripple  . History of loop recorder   . Hypothyroidism   . Low blood pressure    no falls, but if gets up to fast  . RLS (restless legs syndrome)   . Stroke [facility]) 7 years ago   Lasting effects on balance, different personality.  . Thyroid disease   . Tremor   . 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 ATRIAL FIBRILLATION ABLATION  10/18/12   PVI by Dr Rayann Heman  .  ATRIAL FIBRILLATION ABLATION N/A 10/18/2012   Procedure: ATRIAL FIBRILLATION ABLATION;  Surgeon: Thompson Grayer, MD;  Location: [facility] CATH LAB;  Service: Cardiovascular;  Laterality: N/A;  . ATRIAL FIBRILLATION ABLATION N/A 04/18/2019   Procedure: ATRIAL FIBRILLATION ABLATION;  Surgeon: Thompson Grayer, MD;  Location: [place] CV LAB;  Service: Cardiovascular;  Laterality: N/A;  . EUS N/A 10/19/2019   Procedure: FULL UPPER ENDOSCOPIC ULTRASOUND (EUS) RADIAL;  Surgeon: Holly Bodily, MD;  Location: [facility] ENDOSCOPY;  Service: Gastroenterology;  Laterality: N/A;  . EYE SURGERY     on L/R eye, macular hole   . FRACTURE SURGERY    . implantable loop recorder placement  07/13/2019   MDT Reveal LINQ1 [facility] [id number]) implanted in office by Dr Rayann Heman for afib management post ablation  . OPEN REDUCTION INTERNAL FIXATION (ORIF) DISTAL RADIAL FRACTURE Right 11/15/2014   Procedure: OPEN REDUCTION INTERNAL FIXATION (ORIF) RIGHT DISTAL RADIAL FRACTURE WITH ALLOGRAFT BONE GRAFT;  Surgeon: Roseanne Kaufman,  MD;  Location: [place];  Service: Orthopedics;  Laterality: Right;  . PANCREATECTOMY N/A 02/13/2020   Procedure: LAPAROSCOPIC DISTAL PANCREATECTOMY;  Surgeon: Stark Klein, MD;  Location: [place];  Service: General;  Laterality: N/A;  . TEE WITHOUT CARDIOVERSION N/A 10/18/2012   Procedure: TRANSESOPHAGEAL ECHOCARDIOGRAM (TEE);  Surgeon: Lelon Perla, MD;  Location: [facility] ENDOSCOPY;  Service: Cardiovascular;  Laterality: N/A;  Pre-Ablation at 12pm  . TONSILLECTOMY    . VAGINAL HYSTERECTOMY     Family History  Problem Relation Age of Onset  . Multiple sclerosis Father   . Breast cancer Neg Hx    Social History   Socioeconomic History  . Marital status: Widowed    Spouse name: Not on file  . Number of children: 0  . Years of education: Not on file  . Highest education level: Not on file  Occupational History  . Occupation: Retired    Fish farm manager: RETIRED  Tobacco Use  . Smoking status:  Never Smoker  . Smokeless tobacco: Never Used  Vaping Use  . Vaping Use: Never used  Substance and Sexual Activity  . Alcohol use: Yes    Alcohol/week: 2.0 standard drinks    Types: 2 Standard drinks or equivalent per week    Comment: regular  . Drug use: No  . Sexual activity: Yes  Other Topics Concern  . Not on file  Social History Narrative   Lives in [place] with her husband.  No children 2 step kids.  Former Psychologist, prison and probation services   Enjoys exercise- water classes, yoga Editor, commissioning.    Widowed 2019    Only child and mother was only child   Used to sail with husband      Has a living will-    Would desire CPR   Would not want prolonged life support if futile.      Has 1 cat lives alone no kids but see above    Social Determinants of Health   Financial Resource Strain: Not on file  Food Insecurity: Not on file  Transportation Needs: Not on file  Physical Activity: Not on file  Stress: Not on file  Social Connections: Not on file  Intimate Partner Violence: Not on file   Current Meds  Medication Sig  . acetaminophen (TYLENOL) 500 MG tablet Take 1,000 mg by mouth every 6 (six) hours as needed for mild pain or moderate pain.  Marland Kitchen amphetamine-dextroamphetamine (ADDERALL XR) 10 MG 24 hr capsule Take 1 capsule (10 mg total) by mouth daily.  Marland Kitchen apixaban (ELIQUIS) 5 MG TABS tablet Take 1 tablet (5 mg total) by mouth in the morning and at bedtime.  . B Complex-C (B-COMPLEX WITH VITAMIN C) tablet Take 1 tablet by mouth daily.  . blood glucose meter kit and supplies KIT Dispense based on patient and insurance preference. Use up to four times daily as directed. (FOR ICD-9 250.00, 250.01).  . Calcium Carb-Cholecalciferol (CALCIUM 600+D3 PO) Take 2 tablets by mouth daily.  . Cholecalciferol (VITAMIN D3) 50 MCG (2000 UT) TABS Take 4,000 Units by mouth daily.  Marland Kitchen CRANBERRY EXTRACT PO Take 2 tablets by mouth every evening.   . denosumab (PROLIA) 60 MG/ML SOLN injection Inject 60 mg into  the skin every 6 (six) months.   . DULoxetine (CYMBALTA) 60 MG capsule Take 1 capsule (60 mg total) by mouth 2 (two) times daily. (Patient taking differently: Take 120 mg by mouth daily.)  . estradiol (ESTRACE VAGINAL) 0.1 MG/GM vaginal cream Apply 0.$RemoveBefor'5mg'vJZfYwovGuRA$  (pea-sized  amount)  just inside the vaginal introitus with a finger-tip on Monday, Wednesday and Friday nights. (Patient taking differently: Apply 0.5mg  (pea-sized amount)  just inside the vaginal introitus with a finger-tip on Monday, Wednesday and Friday nights.)  . flecainide (TAMBOCOR) 100 MG tablet TAKE 1 TABLET (100 MG TOTAL) BY MOUTH IN THE MORNING AND AT BEDTIME.  . LORazepam (ATIVAN) 0.5 MG tablet TAKE 1 TABLET (0.5 MG TOTAL) BY MOUTH EVERY 8 (EIGHT) HOURS. (Patient taking differently: Take 0.5 mg by mouth every 8 (eight) hours as needed for anxiety.)  . melatonin 5 MG TABS Take 2.5-5 mg by mouth at bedtime as needed (sleep).  . Multiple Vitamin (MULTIVITAMIN WITH MINERALS) TABS tablet Take 1 tablet by mouth daily.  Marland Kitchen SALT SUBSTITUTES PO Take 1 tablet by mouth daily as needed (hypotension).  . timolol (TIMOPTIC) 0.5 % ophthalmic solution Place 1 drop into the left eye at bedtime.   . vitamin C (ASCORBIC ACID) 500 MG tablet Take 1,000 mg by mouth daily.   . vitamin E 400 UNIT capsule Take 400 Units by mouth daily.    . [DISCONTINUED] gabapentin (NEURONTIN) 100 MG capsule Take 2 capsules (200 mg total) by mouth 2 (two) times daily.  . [DISCONTINUED] levothyroxine (SYNTHROID) 25 MCG tablet Take 1 tablet (25 mcg total) by mouth daily before breakfast. 30 minutes before food  . [DISCONTINUED] nitrofurantoin, macrocrystal-monohydrate, (MACROBID) 100 MG capsule Take 1 capsule (100 mg total) by mouth 2 (two) times daily.  . [DISCONTINUED] ondansetron (ZOFRAN-ODT) 4 MG disintegrating tablet Take 1 tablet (4 mg total) by mouth every 6 (six) hours as needed for nausea.  . [DISCONTINUED] pravastatin (PRAVACHOL) 10 MG tablet Take 1 tablet (10 mg total) by  mouth at bedtime. Take a whole pill  . [DISCONTINUED] traMADol (ULTRAM) 50 MG tablet Take 1 tablet (50 mg total) by mouth every 6 (six) hours as needed (mild pain).   Allergies  Allergen Reactions  . Darvon Other (See Comments)    Severe panic attacks  . Propoxyphene Other (See Comments)    GI Upset Severe panic attacks GI Upset  . Propoxyphene N-Acetaminophen Other (See Comments)    Severe panic attacks  . Levofloxacin Anxiety and Other (See Comments)    Causes panic attacks.   Recent Results (from the past 2160 hour(s))  CUP PACEART REMOTE DEVICE CHECK     Status: None   Collection Time: 10/23/20 12:55 AM  Result Value Ref Range   Date Time Interrogation Session [id number]    Pulse Generator Manufacturer MERM    Pulse Gen Model [id number] Reveal LINQ    Pulse Gen Serial Number [id number]    Clinic Name [facility]    Implantable Pulse Generator Type ICM/ILR    Implantable Pulse Generator Implant Date 03500938   CUP PACEART REMOTE DEVICE CHECK     Status: None   Collection Time: 11/25/20 12:56 AM  Result Value Ref Range   Date Time Interrogation Session [id number]    Pulse Generator Manufacturer MERM    Pulse Gen Model [id number] Reveal LINQ    Pulse Gen Serial Number [id number]    Clinic Name [facility]    Implantable Pulse Generator Type ICM/ILR    Implantable Pulse Generator Implant Date [id number]   Resp Panel by RT-PCR (Flu A&B, Covid) Nasopharyngeal Swab     Status: None   Collection Time: 12/16/20  3:19 PM   Specimen: Nasopharyngeal Swab; Nasopharyngeal(NP) swabs in vial transport medium  Result Value Ref Range   SARS Coronavirus 2 by  RT PCR NEGATIVE NEGATIVE    Comment: (NOTE) SARS-CoV-2 target nucleic acids are NOT DETECTED.  The SARS-CoV-2 RNA is generally detectable in upper respiratory specimens during the acute phase of infection. The lowest concentration of SARS-CoV-2 viral copies this assay can detect is 138 copies/mL. A negative result does not  preclude SARS-Cov-2 infection and should not be used as the sole basis for treatment or other patient management decisions. A negative result may occur with  improper specimen collection/handling, submission of specimen other than nasopharyngeal swab, presence of viral mutation(s) within the areas targeted by this assay, and inadequate number of viral copies(<138 copies/mL). A negative result must be combined with clinical observations, patient history, and epidemiological information. The expected result is Negative.  Fact Sheet for Patients:  EntrepreneurPulse.com.au  Fact Sheet for Healthcare Providers:  IncredibleEmployment.be  This test is no t yet approved or cleared by the [place] FDA and  has been authorized for detection and/or diagnosis of SARS-CoV-2 by FDA under an Emergency Use Authorization (EUA). This EUA will remain  in effect (meaning this test can be used) for the duration of the COVID-19 declaration under Section 564(b)(1) of the Act, 21 U.S.C.section 360bbb-3(b)(1), unless the authorization is terminated  or revoked sooner.       Influenza A by PCR NEGATIVE NEGATIVE   Influenza B by PCR NEGATIVE NEGATIVE    Comment: (NOTE) The Xpert Xpress SARS-CoV-2/FLU/RSV plus assay is intended as an aid in the diagnosis of influenza from Nasopharyngeal swab specimens and should not be used as a sole basis for treatment. Nasal washings and aspirates are unacceptable for Xpert Xpress SARS-CoV-2/FLU/RSV testing.  Fact Sheet for Patients: EntrepreneurPulse.com.au  Fact Sheet for Healthcare Providers: IncredibleEmployment.be  This test is not yet approved or cleared by the [place] FDA and has been authorized for detection and/or diagnosis of SARS-CoV-2 by FDA under an Emergency Use Authorization (EUA). This EUA will remain in effect (meaning this test can be used) for the duration of  the COVID-19 declaration under Section 564(b)(1) of the Act, 21 U.S.C. section 360bbb-3(b)(1), unless the authorization is terminated or revoked.  Performed at [facility], [address] [place], [place] [address]   CBC with Differential     Status: Abnormal   Collection Time: 12/16/20  3:19 PM  Result Value Ref Range   WBC 9.5 4.0 - 10.5 K/uL   RBC 3.80 (L) 3.87 - 5.11 MIL/uL   Hemoglobin 11.4 (L) 12.0 - 15.0 g/dL   HCT 34.6 (L) 36.0 - 46.0 %   MCV 91.1 80.0 - 100.0 fL   MCH 30.0 26.0 - 34.0 pg   MCHC 32.9 30.0 - 36.0 g/dL   RDW 13.9 11.5 - 15.5 %   Platelets 258 150 - 400 K/uL   nRBC 0.0 0.0 - 0.2 %   Neutrophils Relative % 72 %   Neutro Abs 6.8 1.7 - 7.7 K/uL   Lymphocytes Relative 17 %   Lymphs Abs 1.7 0.7 - 4.0 K/uL   Monocytes Relative 9 %   Monocytes Absolute 0.9 0.1 - 1.0 K/uL   Eosinophils Relative 1 %   Eosinophils Absolute 0.1 0.0 - 0.5 K/uL   Basophils Relative 1 %   Basophils Absolute 0.1 0.0 - 0.1 K/uL   Immature Granulocytes 0 %   Abs Immature Granulocytes 0.04 0.00 - 0.07 K/uL    Comment: Performed at [facility], [address] [place], [place] [address]  Comprehensive metabolic panel     Status: Abnormal  Collection Time: 12/16/20  3:19 PM  Result Value Ref Range   Sodium 136 135 - 145 mmol/L   Potassium 4.6 3.5 - 5.1 mmol/L   Chloride 104 98 - 111 mmol/L   CO2 23 22 - 32 mmol/L   Glucose, Bld 130 (H) 70 - 99 mg/dL    Comment: Glucose reference range applies only to samples taken after fasting for at least 8 hours.   BUN 40 (H) 8 - 23 mg/dL   Creatinine, Ser 0.94 0.44 - 1.00 mg/dL   Calcium 9.0 8.9 - 10.3 mg/dL   Total Protein 6.4 (L) 6.5 - 8.1 g/dL   Albumin 3.7 3.5 - 5.0 g/dL   AST 31 15 - 41 U/L   ALT 26 0 - 44 U/L   Alkaline Phosphatase 52 38 - 126 U/L   Total Bilirubin 0.7 0.3 - 1.2 mg/dL   GFR, Estimated >60 >60 mL/min    Comment: (NOTE) Calculated using the CKD-EPI Creatinine Equation (2021)    Anion gap 9 5 -  15    Comment: Performed at [facility], [address] [place], [place] [address]  Protime-INR     Status: Abnormal   Collection Time: 12/16/20  3:19 PM  Result Value Ref Range   Prothrombin Time 16.9 (H) 11.4 - 15.2 seconds   INR 1.4 (H) 0.8 - 1.2    Comment: (NOTE) INR goal varies based on device and disease states. Performed at [facility], [place], [place] [address]   Troponin I (High Sensitivity)     Status: None   Collection Time: 12/16/20  3:19 PM  Result Value Ref Range   Troponin I (High Sensitivity) 4 <18 ng/L    Comment: (NOTE) Elevated high sensitivity troponin I (hsTnI) values and significant  changes across serial measurements may suggest ACS but many other  chronic and acute conditions are known to elevate hsTnI results.  Refer to the "Links" section for chest pain algorithms and additional  guidance. Performed at [facility], [address] [place], [place] [address]   Magnesium     Status: None   Collection Time: 12/16/20  3:19 PM  Result Value Ref Range   Magnesium 2.1 1.7 - 2.4 mg/dL    Comment: Performed at [facility], [address] [place], [place] [address]  Lactic acid, plasma     Status: Abnormal   Collection Time: 12/16/20  3:54 PM  Result Value Ref Range   Lactic Acid, Venous 2.1 (HH) 0.5 - 1.9 mmol/L    Comment: CRITICAL RESULT CALLED TO, READ BACK BY AND VERIFIED WITH KASSIE FELPS $RemoveBefo'[id number]'EpmvkZpLEYU$  ON 12/16/20 SKL Performed at [facility], [address] [place], [place] [address]   Lactic acid, plasma     Status: None   Collection Time: 12/16/20  8:27 PM  Result Value Ref Range   Lactic Acid, Venous 1.1 0.5 - 1.9 mmol/L    Comment: Performed at [facility], [address] [place], [place] [address]  Type and screen Ordered by PROVIDER DEFAULT     Status: None   Collection Time: 12/16/20  8:27 PM  Result Value Ref Range   ABO/RH(D) A POS    Antibody Screen NEG     Sample Expiration      12/19/2020,2359 Performed at [facility], [address] [place], [place] [address]   Troponin I (High Sensitivity)     Status: None   Collection Time: 12/16/20  8:27 PM  Result Value Ref Range   Troponin I (High Sensitivity) 5 <18 ng/L    Comment: (NOTE) Elevated high sensitivity troponin I (hsTnI) values and significant  changes across serial measurements may suggest ACS but many other  chronic and acute conditions are known to elevate hsTnI results.  Refer to the "Links" section for chest pain algorithms and additional  guidance. Performed at [facility], [address] [place], [place] [address]   Basic metabolic panel     Status: Abnormal   Collection Time: 12/17/20  6:20 AM  Result Value Ref Range   Sodium 138 135 - 145 mmol/L   Potassium 3.7 3.5 - 5.1 mmol/L    Comment: HEMOLYSIS AT THIS LEVEL MAY AFFECT RESULT   Chloride 105 98 - 111 mmol/L   CO2 26 22 - 32 mmol/L   Glucose, Bld 105 (H) 70 - 99 mg/dL    Comment: Glucose reference range applies only to samples taken after fasting for at least 8 hours.   BUN 22 8 - 23 mg/dL   Creatinine, Ser 0.56 0.44 - 1.00 mg/dL   Calcium 8.3 (L) 8.9 - 10.3 mg/dL   GFR, Estimated >60 >60 mL/min    Comment: (NOTE) Calculated using the CKD-EPI Creatinine Equation (2021)    Anion gap 7 5 - 15    Comment: Performed at [facility], [address] [place], [place] [address]  CBC     Status: Abnormal   Collection Time: 12/17/20  6:20 AM  Result Value Ref Range   WBC 5.6 4.0 - 10.5 K/uL   RBC 3.67 (L) 3.87 - 5.11 MIL/uL   Hemoglobin 11.1 (L) 12.0 - 15.0 g/dL   HCT 33.4 (L) 36.0 - 46.0 %   MCV 91.0 80.0 - 100.0 fL   MCH 30.2 26.0 - 34.0 pg   MCHC 33.2 30.0 - 36.0 g/dL   RDW 14.2 11.5 - 15.5 %   Platelets 222 150 - 400 K/uL   nRBC 0.0 0.0 - 0.2 %    Comment: Performed at [facility], [address] [place], [place] [address]  Urinalysis, Complete w Microscopic      Status: Abnormal   Collection Time: 12/17/20 10:30 AM  Result Value Ref Range   Color, Urine YELLOW (A) YELLOW   APPearance HAZY (A) CLEAR   Specific Gravity, Urine 1.018 1.005 - 1.030   pH 6.0 5.0 - 8.0   Glucose, UA NEGATIVE NEGATIVE mg/dL   Hgb urine dipstick NEGATIVE NEGATIVE   Bilirubin Urine NEGATIVE NEGATIVE   Ketones, ur NEGATIVE NEGATIVE mg/dL   Protein, ur NEGATIVE NEGATIVE mg/dL   Nitrite NEGATIVE NEGATIVE   Leukocytes,Ua LARGE (A) NEGATIVE   RBC / HPF 0-5 0 - 5 RBC/hpf   WBC, UA 21-50 0 - 5 WBC/hpf   Bacteria, UA FEW (A) NONE SEEN   Squamous Epithelial / LPF 0-5 0 - 5    Comment: Performed at [facility], [address] [place], [place] [address]  Urine Culture     Status: Abnormal   Collection Time: 12/17/20 10:30 AM   Specimen: Urine, Random  Result Value Ref Range   Specimen Description      URINE, RANDOM Performed at [facility], [address] [place], [place] [address]    Special Requests      NONE Performed at [facility], [address], [place],  [address]    Culture >=100,000 COLONIES/mL ESCHERICHIA COLI (A)    Report Status 12/20/2020 FINAL  Organism ID, Bacteria ESCHERICHIA COLI (A)       Susceptibility   Escherichia coli - MIC*    AMPICILLIN 4 SENSITIVE Sensitive     CEFAZOLIN <=4 SENSITIVE Sensitive     CEFEPIME <=0.12 SENSITIVE Sensitive     CEFTRIAXONE <=0.25 SENSITIVE Sensitive     CIPROFLOXACIN <=0.25 SENSITIVE Sensitive     GENTAMICIN <=1 SENSITIVE Sensitive     IMIPENEM <=0.25 SENSITIVE Sensitive     NITROFURANTOIN <=16 SENSITIVE Sensitive     TRIMETH/SULFA <=20 SENSITIVE Sensitive     AMPICILLIN/SULBACTAM <=2 SENSITIVE Sensitive     PIP/TAZO <=4 SENSITIVE Sensitive     * >=100,000 COLONIES/mL ESCHERICHIA COLI  ECHOCARDIOGRAM COMPLETE     Status: None   Collection Time: 12/17/20  7:23 PM  Result Value Ref Range   Weight 2,560 oz   Height 69 in   BP 106/62 mmHg   S' Lateral 3.14 cm    Area-P 1/2 2.99 cm2  CUP PACEART REMOTE DEVICE CHECK     Status: None   Collection Time: 12/28/20 12:57 AM  Result Value Ref Range   Date Time Interrogation Session [id number]    Pulse Generator Manufacturer MERM    Pulse Gen Model [id number] Reveal LINQ    Pulse Gen Serial Number [id number]    Clinic Name [facility]    Implantable Pulse Generator Type ICM/ILR    Implantable Pulse Generator Implant Date [id number]    Eval Rhythm SR at 75 bpm    Objective  Body mass index is 23.63 kg/m. Wt Readings from Last 3 Encounters:  12/26/20 160 lb (72.6 kg)  12/16/20 160 lb (72.6 kg)  10/09/20 171 lb 3.2 oz (77.7 kg)   Temp Readings from Last 3 Encounters:  12/26/20 (!) 97.4 F (36.3 C) (Oral)  12/19/20 98.1 F (36.7 C)  10/09/20 98.5 F (36.9 C) (Oral)   BP Readings from Last 3 Encounters:  12/26/20 100/68  12/19/20 (!) 119/59  10/09/20 110/70   Pulse Readings from Last 3 Encounters:  12/26/20 93  12/19/20 77  10/09/20 84    Physical Exam Vitals and nursing note reviewed.  Constitutional:      Appearance: Normal appearance. She is well-developed and well-groomed.  HENT:     Head: Normocephalic and atraumatic.  Eyes:     Conjunctiva/sclera: Conjunctivae normal.     Pupils: Pupils are equal, round, and reactive to light.  Cardiovascular:     Rate and Rhythm: Normal rate and regular rhythm.     Heart sounds: Normal heart sounds. No murmur heard.   Pulmonary:     Effort: Pulmonary effort is normal.     Breath sounds: Normal breath sounds.  Musculoskeletal:       Legs:  Skin:    General: Skin is warm and dry.  Neurological:     General: No focal deficit present.     Mental Status: She is alert and oriented to person, place, and time. Mental status is at baseline.     Gait: Gait abnormal.     Comments: Pt in wheelchair today  Psychiatric:        Attention and Perception: Attention and perception normal.        Mood and Affect: Mood and affect normal.         Speech: Speech normal.        Behavior: Behavior normal. Behavior is cooperative.        Thought Content: Thought content normal.        Cognition  and Memory: Cognition and memory normal.        Judgment: Judgment normal.     Assessment  Plan  Memory loss - Plan: Ambulatory referral to Neurology Cerebrovascular accident (CVA), unspecified mechanism ([place]) - Plan: pravastatin (PRAVACHOL) 10 MG tablet, Ambulatory referral to Neurology Essential tremor - Plan: gabapentin (NEURONTIN) 200 MG capsule bid Ambulatory referral to Neurology -see HPI needs letter from neurology commenting on if she can live independently [facility] or rec [facility]F Will need [facility] evaluation See HPI  Will need pcp f/u in 60 days, 90 days if I am not available one of my other providers vs regina baity at [facility] declines to see Dr. Yves Dill with [facility] today Child psychotherapist, nursing in detail on phone while pt here and filled out forms for [facility] to coordinate care for patient  Multiple fractures see HPI Closed fracture of right foot with routine healing, subsequent encounter - Plan: traMADol (ULTRAM) 50 MG tablet qd to bid prn, Ambulatory referral to Orthopedic Surgery Doing PT/OT at [place] currently   E coli uti not tx'ed at [facility] 5/16-5/19 Acute cystitis without hematuria - Plan: nitrofurantoin, macrocrystal-monohydrate, (MACROBID) 100 MG capsule bid x 5 days  Repeat urine culture [facility] in 2 weeks  Hypothyroidism, unspecified type - Plan: levothyroxine (SYNTHROID) 25 MCG tablet  Hyperlipidemia, unspecified hyperlipidemia type - Plan: pravastatin (PRAVACHOL) 10 MG tablet  Dependent for transportation Contact [facility] [place] 336 [phone]   Concern about end of life -SW to disc current DNR status but pt does not want to be DNR given most form today to disc with [facility] and return copy to PCP to sign  Pt wants to no longer be DNR which [facility] is [facility]   HM Flu shot utd  utd prevnar  pna 23utd Tdap utd  shingrix had 2/2 shots covid vx had 4/4 moderna  Pap out of age window mammo1/14/22 negativeordered repeat Colonoscopy had 03/06/14 diverticulosis  02/17/16 osteoporosis reordered another  dexa + osteoporosis next prolia shot had 10/09/20, ordered repeat dexa Skin [facility] derm [facility] 12/22/19 Aks face x 3 f/u 1 year Dr. Will Bonnet  Echo had 04/11/2019 ef 60-65% She is doing [facility] PT [facility] Allen Norris  Dental Dr. Sandrea Hughs will establish  POA paperwork obtained today and scanned by [facility]   Specialist  Dr. Rayann Heman, Dr. Lovena Le EP Dr. Clovis Pu psych  [facility]  Neuro Dr. Melrose Nakayama   Time spent 60 minutes I spoke with friend at appt today, social worker and nurse at [facility] via phone during visit, filled out forms and coordinated care, and reviewed hospital notes and imaging and disc code status does not want to be DNR  Provider: Dr. Olivia Mackie McLean-Scocuzza-Internal Medicine

## 2020-12-27 ENCOUNTER — Telehealth: Payer: Self-pay

## 2020-12-27 NOTE — Telephone Encounter (Signed)
Left message to call back for lab results.

## 2020-12-28 LAB — CUP PACEART REMOTE DEVICE CHECK
Date Time Interrogation Session: 20220528005757
Implantable Pulse Generator Implant Date: 20201210

## 2020-12-31 ENCOUNTER — Telehealth: Payer: Self-pay

## 2020-12-31 DIAGNOSIS — R413 Other amnesia: Secondary | ICD-10-CM

## 2020-12-31 DIAGNOSIS — Z748 Other problems related to care provider dependency: Secondary | ICD-10-CM | POA: Insufficient documentation

## 2020-12-31 HISTORY — DX: Other amnesia: R41.3

## 2020-12-31 MED ORDER — LEVOTHYROXINE SODIUM 25 MCG PO TABS: 25.0000 ug | ORAL_TABLET | Freq: Every day | ORAL | 3 refills | Status: DC

## 2020-12-31 MED ORDER — GABAPENTIN 100 MG PO CAPS: 200.0000 mg | ORAL_CAPSULE | Freq: Two times a day (BID) | ORAL | 3 refills | Status: DC

## 2020-12-31 MED ORDER — PRAVASTATIN SODIUM 10 MG PO TABS: 10.0000 mg | ORAL_TABLET | Freq: Every day | ORAL | 3 refills | Status: AC

## 2020-12-31 MED ORDER — NITROFURANTOIN MONOHYD MACRO 100 MG PO CAPS: 100.0000 mg | ORAL_CAPSULE | Freq: Two times a day (BID) | ORAL | 0 refills | Status: DC

## 2020-12-31 MED ORDER — TRAMADOL HCL 50 MG PO TABS: 50.0000 mg | ORAL_TABLET | Freq: Two times a day (BID) | ORAL | 0 refills | Status: DC | PRN

## 2020-12-31 NOTE — Telephone Encounter (Signed)
Ok note done for referrals thank you

## 2020-12-31 NOTE — Addendum Note (Signed)
Addended by: Orland Mustard on: 12/31/2020 07:04 PM   Modules accepted: Level of Service

## 2020-12-31 NOTE — Telephone Encounter (Signed)
Please advise on referrals   Patient seen 12/26/20

## 2020-12-31 NOTE — Telephone Encounter (Signed)
Rich Brave called to check on referrals for ortho and neurology. She states that the pt was seen last week and Dr Waverly was going to get those sent in asap but has not heard anything. Please advise

## 2021-01-02 ENCOUNTER — Telehealth: Payer: Self-pay | Admitting: Internal Medicine

## 2021-01-02 NOTE — Telephone Encounter (Signed)
Althia Forts called in about referrals for patient she is on [facility]

## 2021-01-03 ENCOUNTER — Telehealth: Payer: Self-pay | Admitting: Internal Medicine

## 2021-01-03 NOTE — Telephone Encounter (Signed)
Althia Forts the POA came into the office today to request that the clinical notes be sent over to [facility] for the upcoming appt on June 28,2022. The letter is up front in [facility] box in regards to this with more info.

## 2021-01-03 NOTE — Telephone Encounter (Signed)
Office notes were faxed to [facility] 01/01/21 with referral.   POA notified

## 2021-01-06 ENCOUNTER — Telehealth: Payer: Self-pay | Admitting: *Deleted

## 2021-01-06 MED ORDER — FLECAINIDE ACETATE 100 MG PO TABS: 100.0000 mg | ORAL_TABLET | Freq: Two times a day (BID) | ORAL | 0 refills | Status: DC

## 2021-01-06 MED ORDER — ELIQUIS 5 MG PO TABS: 5.0000 mg | ORAL_TABLET | Freq: Two times a day (BID) | ORAL | 0 refills | Status: AC

## 2021-01-06 MED ORDER — ELIQUIS 5 MG PO TABS: 5.0000 mg | ORAL_TABLET | Freq: Two times a day (BID) | ORAL | 0 refills | Status: DC

## 2021-01-06 NOTE — Telephone Encounter (Signed)
-----   Message from Thompson Grayer, MD sent at 01/05/2021  8:44 PM EDT ----- She is overdue to see [place] (last seen 3/21).  I suspect we could wean her flecainide and diltiazem when I see her.  Otila Kluver, ok to refill current CV meds for 30 days.  [place], please get her in to see me in the interim.  Thanks!    ----- Message ----- From: McLean-Scocuzza, Nino Glow, MD Sent: 12/31/2020   6:33 PM EDT To: Thompson Grayer, MD  Pt needs [facility] can you or your staff help its for pill packaging?  thanks

## 2021-01-06 NOTE — Telephone Encounter (Signed)
Medication refills sent to pharmacy 

## 2021-01-07 DIAGNOSIS — S8263XA Displaced fracture of lateral malleolus of unspecified fibula, initial encounter for closed fracture: Secondary | ICD-10-CM | POA: Insufficient documentation

## 2021-01-07 DIAGNOSIS — S8261XA Displaced fracture of lateral malleolus of right fibula, initial encounter for closed fracture: Secondary | ICD-10-CM | POA: Diagnosis not present

## 2021-01-08 NOTE — Telephone Encounter (Signed)
Noted  

## 2021-01-14 ENCOUNTER — Encounter: Payer: Self-pay | Admitting: Internal Medicine

## 2021-01-14 NOTE — Progress Notes (Signed)
Carelink Summary Report / Loop Recorder 

## 2021-01-16 ENCOUNTER — Telehealth: Payer: Self-pay | Admitting: Internal Medicine

## 2021-01-16 NOTE — Telephone Encounter (Signed)
Nurse [facility] calling in from [facility]. States Patient is scheduled to get her Lorazapam 0.5 mg daily at 7 am. States this is during a shift change and the Patient was given her medication twice.   Patient was given Lorazepam 0.5mg  at 6:45 am and then again around 7:40 am. They state the Patient is doing good, no reactions or symptoms. States Patient is actually a lot calmer.   Spoke with Dr Olivia Mackie McLean-Scocuzza and informed Buffy that Patient is okay having two pills this morning as long as she in not symptomatic. States they need to reach out to Dr Clovis Pu who manages medication if they would like a dose change.

## 2021-01-16 NOTE — Telephone Encounter (Signed)
It appears from the message left that the nursing facility would like to have the order for the lorazepam written at a different time that does not correspond to shift change.  Please ask how they would like the prescription adjusted in terms of time.

## 2021-01-16 NOTE — Telephone Encounter (Signed)
Changes to psych meds rec Dr. Clovis Pu change

## 2021-01-20 ENCOUNTER — Telehealth: Payer: Self-pay | Admitting: Internal Medicine

## 2021-01-20 ENCOUNTER — Other Ambulatory Visit: Payer: Self-pay | Admitting: Internal Medicine

## 2021-01-20 NOTE — Telephone Encounter (Signed)
For your information  

## 2021-01-20 NOTE — Telephone Encounter (Signed)
Noted not sure what this is but ok  Dr. Gayland Curry

## 2021-01-20 NOTE — Telephone Encounter (Signed)
noted 

## 2021-01-20 NOTE — Telephone Encounter (Signed)
[facility] called to advise that they had to apply a wonderguard to the PT due to her confusion to keep her safe and to prevent her from leaving.

## 2021-01-22 ENCOUNTER — Telehealth: Payer: Self-pay | Admitting: Internal Medicine

## 2021-01-22 NOTE — Telephone Encounter (Signed)
Driscilla Grammes, Director of Nursing at [facility], [phone] wanted to update Dr Aundra Dubin on patient. Patient's mental health is declining. She is not following directions, she has tried to leave the unit. Mariann Laster wanted Dr Aundra Dubin to know that patient will not be able to be transferred to any doctor's visits. She states it might be time to let the nursing home doctor take over.

## 2021-01-22 NOTE — Telephone Encounter (Signed)
Noted will save this for Dr Olivia Mackie McLean-Scocuzza for when she returns.   Please advise, okay to transfer care to onsite doctor?

## 2021-01-23 ENCOUNTER — Ambulatory Visit (INDEPENDENT_AMBULATORY_CARE_PROVIDER_SITE_OTHER): Payer: PPO

## 2021-01-23 DIAGNOSIS — I63412 Cerebral infarction due to embolism of left middle cerebral artery: Secondary | ICD-10-CM

## 2021-01-27 DIAGNOSIS — S82201D Unspecified fracture of shaft of right tibia, subsequent encounter for closed fracture with routine healing: Secondary | ICD-10-CM | POA: Diagnosis not present

## 2021-01-27 DIAGNOSIS — S8264XD Nondisplaced fracture of lateral malleolus of right fibula, subsequent encounter for closed fracture with routine healing: Secondary | ICD-10-CM | POA: Diagnosis not present

## 2021-01-27 DIAGNOSIS — R278 Other lack of coordination: Secondary | ICD-10-CM | POA: Diagnosis not present

## 2021-01-27 DIAGNOSIS — G2581 Restless legs syndrome: Secondary | ICD-10-CM | POA: Diagnosis not present

## 2021-01-27 DIAGNOSIS — W19XXXD Unspecified fall, subsequent encounter: Secondary | ICD-10-CM | POA: Diagnosis not present

## 2021-01-27 DIAGNOSIS — Z741 Need for assistance with personal care: Secondary | ICD-10-CM | POA: Diagnosis not present

## 2021-01-27 DIAGNOSIS — I482 Chronic atrial fibrillation, unspecified: Secondary | ICD-10-CM | POA: Diagnosis not present

## 2021-01-27 DIAGNOSIS — R4189 Other symptoms and signs involving cognitive functions and awareness: Secondary | ICD-10-CM | POA: Diagnosis not present

## 2021-01-27 DIAGNOSIS — Z8673 Personal history of transient ischemic attack (TIA), and cerebral infarction without residual deficits: Secondary | ICD-10-CM | POA: Diagnosis not present

## 2021-01-27 DIAGNOSIS — E039 Hypothyroidism, unspecified: Secondary | ICD-10-CM | POA: Diagnosis not present

## 2021-01-27 DIAGNOSIS — F329 Major depressive disorder, single episode, unspecified: Secondary | ICD-10-CM | POA: Diagnosis not present

## 2021-01-27 DIAGNOSIS — S92355D Nondisplaced fracture of fifth metatarsal bone, left foot, subsequent encounter for fracture with routine healing: Secondary | ICD-10-CM | POA: Diagnosis not present

## 2021-01-27 DIAGNOSIS — F419 Anxiety disorder, unspecified: Secondary | ICD-10-CM | POA: Diagnosis not present

## 2021-01-27 NOTE — Telephone Encounter (Signed)
For your information  

## 2021-01-28 DIAGNOSIS — R251 Tremor, unspecified: Secondary | ICD-10-CM | POA: Diagnosis not present

## 2021-01-28 DIAGNOSIS — R413 Other amnesia: Secondary | ICD-10-CM | POA: Diagnosis not present

## 2021-01-28 DIAGNOSIS — Z8673 Personal history of transient ischemic attack (TIA), and cerebral infarction without residual deficits: Secondary | ICD-10-CM | POA: Diagnosis not present

## 2021-01-28 DIAGNOSIS — R454 Irritability and anger: Secondary | ICD-10-CM | POA: Diagnosis not present

## 2021-01-28 NOTE — Telephone Encounter (Signed)
Driscilla Grammes, Director of Nursing at [facility], [phone] call back  Advise sorry for this  Pt did not want to see Dr. Silvio Pate agreeable to see [facility] or other provider there NOT Dr. Silvio Pate

## 2021-01-29 NOTE — Telephone Encounter (Signed)
Director of Nursing is Elane Fritz number [phone]. Informed the nurse Shirlean Mylar that this transfer is okay and that Patient prefers [facility].

## 2021-01-30 DIAGNOSIS — F419 Anxiety disorder, unspecified: Secondary | ICD-10-CM | POA: Diagnosis not present

## 2021-01-30 LAB — CUP PACEART REMOTE DEVICE CHECK
Date Time Interrogation Session: 20220630010235
Implantable Pulse Generator Implant Date: 20201210

## 2021-02-03 DIAGNOSIS — S82201D Unspecified fracture of shaft of right tibia, subsequent encounter for closed fracture with routine healing: Secondary | ICD-10-CM | POA: Diagnosis not present

## 2021-02-03 DIAGNOSIS — F419 Anxiety disorder, unspecified: Secondary | ICD-10-CM | POA: Diagnosis not present

## 2021-02-03 DIAGNOSIS — S8264XD Nondisplaced fracture of lateral malleolus of right fibula, subsequent encounter for closed fracture with routine healing: Secondary | ICD-10-CM | POA: Diagnosis not present

## 2021-02-03 DIAGNOSIS — R4189 Other symptoms and signs involving cognitive functions and awareness: Secondary | ICD-10-CM | POA: Diagnosis not present

## 2021-02-03 DIAGNOSIS — I482 Chronic atrial fibrillation, unspecified: Secondary | ICD-10-CM | POA: Diagnosis not present

## 2021-02-03 DIAGNOSIS — E039 Hypothyroidism, unspecified: Secondary | ICD-10-CM | POA: Diagnosis not present

## 2021-02-03 DIAGNOSIS — F329 Major depressive disorder, single episode, unspecified: Secondary | ICD-10-CM | POA: Diagnosis not present

## 2021-02-03 DIAGNOSIS — S82301D Unspecified fracture of lower end of right tibia, subsequent encounter for closed fracture with routine healing: Secondary | ICD-10-CM | POA: Diagnosis not present

## 2021-02-03 DIAGNOSIS — R278 Other lack of coordination: Secondary | ICD-10-CM | POA: Diagnosis not present

## 2021-02-03 DIAGNOSIS — W19XXXD Unspecified fall, subsequent encounter: Secondary | ICD-10-CM | POA: Diagnosis not present

## 2021-02-03 DIAGNOSIS — Z741 Need for assistance with personal care: Secondary | ICD-10-CM | POA: Diagnosis not present

## 2021-02-03 DIAGNOSIS — S92355D Nondisplaced fracture of fifth metatarsal bone, left foot, subsequent encounter for fracture with routine healing: Secondary | ICD-10-CM | POA: Diagnosis not present

## 2021-02-03 DIAGNOSIS — G2581 Restless legs syndrome: Secondary | ICD-10-CM | POA: Diagnosis not present

## 2021-02-03 DIAGNOSIS — Z8673 Personal history of transient ischemic attack (TIA), and cerebral infarction without residual deficits: Secondary | ICD-10-CM | POA: Diagnosis not present

## 2021-02-11 NOTE — Progress Notes (Signed)
Carelink Summary Report / Loop Recorder 

## 2021-02-18 DIAGNOSIS — S8261XA Displaced fracture of lateral malleolus of right fibula, initial encounter for closed fracture: Secondary | ICD-10-CM | POA: Diagnosis not present

## 2021-02-20 ENCOUNTER — Ambulatory Visit (INDEPENDENT_AMBULATORY_CARE_PROVIDER_SITE_OTHER): Payer: PPO | Admitting: Internal Medicine

## 2021-02-20 ENCOUNTER — Other Ambulatory Visit: Payer: Self-pay

## 2021-02-20 VITALS — BP 98/58 | HR 80 | Temp 97.8°F | Ht 69.0 in | Wt 171.0 lb

## 2021-02-20 DIAGNOSIS — E039 Hypothyroidism, unspecified: Secondary | ICD-10-CM | POA: Diagnosis not present

## 2021-02-20 DIAGNOSIS — I4811 Longstanding persistent atrial fibrillation: Secondary | ICD-10-CM

## 2021-02-20 DIAGNOSIS — F339 Major depressive disorder, recurrent, unspecified: Secondary | ICD-10-CM | POA: Diagnosis not present

## 2021-02-20 DIAGNOSIS — F909 Attention-deficit hyperactivity disorder, unspecified type: Secondary | ICD-10-CM | POA: Diagnosis not present

## 2021-02-20 DIAGNOSIS — F419 Anxiety disorder, unspecified: Secondary | ICD-10-CM

## 2021-02-20 DIAGNOSIS — R413 Other amnesia: Secondary | ICD-10-CM

## 2021-02-20 NOTE — Progress Notes (Signed)
Chief Complaint  Patient presents with   Follow-up   F/u with POA [name] 1. Filled out FL2 for pt to move to memory care no longer needed estrogen cream as urology Rx this will d/c from list she does have h/o UTI  2. Memory declining est with [facility] neurology will need to f/u with them and disc for some reason if am unable to see for primary care reasons she will see Webb Silversmith at memory care  On aricept 5 mg qd 3. Right lower leg ankle fracture doing well out of boot f/[facility]  4. Mood and attention and anxiety will need further instruction psych Dr. Clovis Pu  5. Afib will transfer care from Dr. Rayann Heman in [place] to Dr. Caryl Comes who comes to [place] on elequis 5 mg bid and flecainide 100 mg bid  6. Hypothyroidism on levo 25 mcg qd   Results for MOZELL, HABER (MRN [id number]) as of 02/27/2021 10:08  07/02/2020 10:59 TSH: 2.07    Review of Systems  Constitutional:  Negative for weight loss.  HENT:  Negative for hearing loss.   Eyes:  Negative for blurred vision.  Respiratory:  Negative for shortness of breath.   Cardiovascular:  Negative for chest pain.  Gastrointestinal:  Negative for abdominal pain.  Musculoskeletal:  Positive for falls. Negative for joint pain.  Skin:  Negative for rash.  Neurological:  Negative for headaches.  Psychiatric/Behavioral:  Positive for memory loss. Negative for depression. The patient is not nervous/anxious and does not have insomnia.        Memory declining other chronic mental health issues stable  Past Medical History:  Diagnosis Date   Actinic keratosis 12/20/2012   left nasal tip   Anxiety    controlled with meds   Anxiety    Arrhythmia    Atrial fibrillation ([name])    paroxysmal, failed medical therapy with flecainide,  NSVT with tikosyn   Basal cell carcinoma 12/20/2012   left proximal mandible   Bruises easily    CVA (cerebral vascular accident) ([place]) 12/2007   Depression    medically controlled    Glaucoma    also with  macular holes AE Dr. Thomasene Ripple   History of loop recorder    Hypothyroidism    Low blood pressure    no falls, but if gets up to fast   RLS (restless legs syndrome)    Stroke ([place]) 7 years ago   Lasting effects on balance, different personality.   Thyroid disease    Tremor    Ventricular tachycardia ([place])    pt told at [facility] that she had RVOT VT   Past Surgical History:  Procedure Laterality Date   ATRIAL FIBRILLATION ABLATION  10/18/12   PVI by Dr Rayann Heman   ATRIAL FIBRILLATION ABLATION N/A 10/18/2012   Procedure: ATRIAL FIBRILLATION ABLATION;  Surgeon: Thompson Grayer, MD;  Location: [facility] CATH LAB;  Service: Cardiovascular;  Laterality: N/A;   ATRIAL FIBRILLATION ABLATION N/A 04/18/2019   Procedure: ATRIAL FIBRILLATION ABLATION;  Surgeon: Thompson Grayer, MD;  Location: [place] CV LAB;  Service: Cardiovascular;  Laterality: N/A;   EUS N/A 10/19/2019   Procedure: FULL UPPER ENDOSCOPIC ULTRASOUND (EUS) RADIAL;  Surgeon: Holly Bodily, MD;  Location: [facility] ENDOSCOPY;  Service: Gastroenterology;  Laterality: N/A;   EYE SURGERY     on L/R eye, macular hole    FRACTURE SURGERY     implantable loop recorder placement  07/13/2019   MDT Reveal LINQ1 [facility] [id number]) implanted in office  by Dr Rayann Heman for afib management post ablation   OPEN REDUCTION INTERNAL FIXATION (ORIF) DISTAL RADIAL FRACTURE Right 11/15/2014   Procedure: OPEN REDUCTION INTERNAL FIXATION (ORIF) RIGHT DISTAL RADIAL FRACTURE WITH ALLOGRAFT BONE GRAFT;  Surgeon: Roseanne Kaufman, MD;  Location: [place];  Service: Orthopedics;  Laterality: Right;   PANCREATECTOMY N/A 02/13/2020   Procedure: LAPAROSCOPIC DISTAL PANCREATECTOMY;  Surgeon: Stark Klein, MD;  Location: [place];  Service: General;  Laterality: N/A;   TEE WITHOUT CARDIOVERSION N/A 10/18/2012   Procedure: TRANSESOPHAGEAL ECHOCARDIOGRAM (TEE);  Surgeon: Lelon Perla, MD;  Location: [facility] ENDOSCOPY;  Service: Cardiovascular;  Laterality: N/A;  Pre-Ablation at  12pm   TONSILLECTOMY     VAGINAL HYSTERECTOMY     Family History  Problem Relation Age of Onset   Multiple sclerosis Father    Breast cancer Neg Hx    Social History   Socioeconomic History   Marital status: Widowed    Spouse name: Not on file   Number of children: 0   Years of education: Not on file   Highest education level: Not on file  Occupational History   Occupation: Retired    Fish farm manager: RETIRED  Tobacco Use   Smoking status: Never   Smokeless tobacco: Never  Vaping Use   Vaping Use: Never used  Substance and Sexual Activity   Alcohol use: Yes    Alcohol/week: 2.0 standard drinks    Types: 2 Standard drinks or equivalent per week    Comment: regular   Drug use: No   Sexual activity: Yes  Other Topics Concern   Not on file  Social History Narrative   Lives in [place] with her husband.  No children 2 step kids.  Former Psychologist, prison and probation services   Enjoys exercise- water classes, yoga Editor, commissioning.    Widowed 2019    Only child and mother was only child   Used to sail with husband      Has a living will-    Would desire CPR   Would not want prolonged life support if futile.      Has 1 cat lives alone no kids but see above    Social Determinants of Health   Financial Resource Strain: Not on file  Food Insecurity: Not on file  Transportation Needs: Not on file  Physical Activity: Not on file  Stress: Not on file  Social Connections: Not on file  Intimate Partner Violence: Not on file   Current Meds  Medication Sig   acetaminophen (TYLENOL) 500 MG tablet Take 1,000 mg by mouth every 6 (six) hours as needed for mild pain or moderate pain.   amphetamine-dextroamphetamine (ADDERALL XR) 10 MG 24 hr capsule Take 1 capsule (10 mg total) by mouth daily.   apixaban (ELIQUIS) 5 MG TABS tablet Take 1 tablet (5 mg total) by mouth in the morning and at bedtime.   B Complex-C (B-COMPLEX WITH VITAMIN C) tablet Take 1 tablet by mouth daily.   blood glucose meter kit and  supplies KIT Dispense based on patient and insurance preference. Use up to four times daily as directed. (FOR ICD-9 250.00, 250.01).   Calcium Carb-Cholecalciferol (CALCIUM 600+D3 PO) Take 2 tablets by mouth daily.   Cholecalciferol (VITAMIN D3) 50 MCG (2000 UT) TABS Take 4,000 Units by mouth daily.   CRANBERRY EXTRACT PO Take 2 tablets by mouth every evening.    denosumab (PROLIA) 60 MG/ML SOLN injection Inject 60 mg into the skin every 6 (six) months.  donepezil (ARICEPT) 5 MG tablet Take 5 mg by mouth at bedtime.   DULoxetine (CYMBALTA) 60 MG capsule Take 1 capsule (60 mg total) by mouth 2 (two) times daily.   gabapentin (NEURONTIN) 100 MG capsule Take 2 capsules (200 mg total) by mouth 2 (two) times daily. For pill pack and delivery   levothyroxine (SYNTHROID) 25 MCG tablet Take 1 tablet (25 mcg total) by mouth daily before breakfast. 30 minutes before food. For pill pack and delivery   LORazepam (ATIVAN) 0.5 MG tablet TAKE 1 TABLET (0.5 MG TOTAL) BY MOUTH EVERY 8 (EIGHT) HOURS.   melatonin 5 MG TABS Take 2.5-5 mg by mouth at bedtime as needed (sleep).   Multiple Vitamin (MULTIVITAMIN WITH MINERALS) TABS tablet Take 1 tablet by mouth daily.   pravastatin (PRAVACHOL) 10 MG tablet Take 1 tablet (10 mg total) by mouth at bedtime. Take a whole pill for pill pack and delivery   SALT SUBSTITUTES PO Take 1 tablet by mouth daily as needed (hypotension).   timolol (TIMOPTIC) 0.5 % ophthalmic solution Place 1 drop into the left eye at bedtime.    vitamin E 400 UNIT capsule Take 400 Units by mouth daily.     [DISCONTINUED] estradiol (ESTRACE VAGINAL) 0.1 MG/GM vaginal cream Apply 0.5mg  (pea-sized amount)  just inside the vaginal introitus with a finger-tip on Monday, Wednesday and Friday nights. (Patient taking differently: Apply 0.5mg  (pea-sized amount)  just inside the vaginal introitus with a finger-tip on Monday, Wednesday and Friday nights.)   [DISCONTINUED] MODERNA COVID-19 VACCINE 100 MCG/0.5ML  injection  (Patient not taking: Reported on 02/25/2021)   [DISCONTINUED] nitrofurantoin, macrocrystal-monohydrate, (MACROBID) 100 MG capsule Take 1 capsule (100 mg total) by mouth 2 (two) times daily.   [DISCONTINUED] traMADol (ULTRAM) 50 MG tablet Take 1 tablet (50 mg total) by mouth 2 (two) times daily as needed (mild pain). Change from [facility] to total care   [DISCONTINUED] vitamin C (ASCORBIC ACID) 500 MG tablet Take 1,000 mg by mouth daily.    Allergies  Allergen Reactions   Darvon Other (See Comments)    Severe panic attacks   Propoxyphene Other (See Comments)    GI Upset Severe panic attacks GI Upset   Propoxyphene N-Acetaminophen Other (See Comments)    Severe panic attacks   Levofloxacin Anxiety and Other (See Comments)    Causes panic attacks.   Recent Results (from the past 2160 hour(s))  Resp Panel by RT-PCR (Flu A&B, Covid) Nasopharyngeal Swab     Status: None   Collection Time: 12/16/20  3:19 PM   Specimen: Nasopharyngeal Swab; Nasopharyngeal(NP) swabs in vial transport medium  Result Value Ref Range   SARS Coronavirus 2 by RT PCR NEGATIVE NEGATIVE    Comment: (NOTE) SARS-CoV-2 target nucleic acids are NOT DETECTED.  The SARS-CoV-2 RNA is generally detectable in upper respiratory specimens during the acute phase of infection. The lowest concentration of SARS-CoV-2 viral copies this assay can detect is 138 copies/mL. A negative result does not preclude SARS-Cov-2 infection and should not be used as the sole basis for treatment or other patient management decisions. A negative result may occur with  improper specimen collection/handling, submission of specimen other than nasopharyngeal swab, presence of viral mutation(s) within the areas targeted by this assay, and inadequate number of viral copies(<138 copies/mL). A negative result must be combined with clinical observations, patient history, and epidemiological information. The expected result is Negative.  Fact Sheet  for Patients:  EntrepreneurPulse.com.au  Fact Sheet for Healthcare Providers:  IncredibleEmployment.be  This  test is no t yet approved or cleared by the [place] and  has been authorized for detection and/or diagnosis of SARS-CoV-2 by FDA under an Emergency Use Authorization (EUA). This EUA will remain  in effect (meaning this test can be used) for the duration of the COVID-19 declaration under Section 564(b)(1) of the Act, 21 U.S.C.section 360bbb-3(b)(1), unless the authorization is terminated  or revoked sooner.       Influenza A by PCR NEGATIVE NEGATIVE   Influenza B by PCR NEGATIVE NEGATIVE    Comment: (NOTE) The Xpert Xpress SARS-CoV-2/FLU/RSV plus assay is intended as an aid in the diagnosis of influenza from Nasopharyngeal swab specimens and should not be used as a sole basis for treatment. Nasal washings and aspirates are unacceptable for Xpert Xpress SARS-CoV-2/FLU/RSV testing.  Fact Sheet for Patients: EntrepreneurPulse.com.au  Fact Sheet for Healthcare Providers: IncredibleEmployment.be  This test is not yet approved or cleared by the [place] FDA and has been authorized for detection and/or diagnosis of SARS-CoV-2 by FDA under an Emergency Use Authorization (EUA). This EUA will remain in effect (meaning this test can be used) for the duration of the COVID-19 declaration under Section 564(b)(1) of the Act, 21 U.S.C. section 360bbb-3(b)(1), unless the authorization is terminated or revoked.  Performed at [facility], [address] [place], [place] [address]   CBC with Differential     Status: Abnormal   Collection Time: 12/16/20  3:19 PM  Result Value Ref Range   WBC 9.5 4.0 - 10.5 K/uL   RBC 3.80 (L) 3.87 - 5.11 MIL/uL   Hemoglobin 11.4 (L) 12.0 - 15.0 g/dL   HCT 34.6 (L) 36.0 - 46.0 %   MCV 91.1 80.0 - 100.0 fL   MCH 30.0 26.0 - 34.0 pg   MCHC 32.9 30.0 -  36.0 g/dL   RDW 13.9 11.5 - 15.5 %   Platelets 258 150 - 400 K/uL   nRBC 0.0 0.0 - 0.2 %   Neutrophils Relative % 72 %   Neutro Abs 6.8 1.7 - 7.7 K/uL   Lymphocytes Relative 17 %   Lymphs Abs 1.7 0.7 - 4.0 K/uL   Monocytes Relative 9 %   Monocytes Absolute 0.9 0.1 - 1.0 K/uL   Eosinophils Relative 1 %   Eosinophils Absolute 0.1 0.0 - 0.5 K/uL   Basophils Relative 1 %   Basophils Absolute 0.1 0.0 - 0.1 K/uL   Immature Granulocytes 0 %   Abs Immature Granulocytes 0.04 0.00 - 0.07 K/uL    Comment: Performed at [facility], [address] [place], [place] [address]  Comprehensive metabolic panel     Status: Abnormal   Collection Time: 12/16/20  3:19 PM  Result Value Ref Range   Sodium 136 135 - 145 mmol/L   Potassium 4.6 3.5 - 5.1 mmol/L   Chloride 104 98 - 111 mmol/L   CO2 23 22 - 32 mmol/L   Glucose, Bld 130 (H) 70 - 99 mg/dL    Comment: Glucose reference range applies only to samples taken after fasting for at least 8 hours.   BUN 40 (H) 8 - 23 mg/dL   Creatinine, Ser 0.94 0.44 - 1.00 mg/dL   Calcium 9.0 8.9 - 10.3 mg/dL   Total Protein 6.4 (L) 6.5 - 8.1 g/dL   Albumin 3.7 3.5 - 5.0 g/dL   AST 31 15 - 41 U/L   ALT 26 0 - 44 U/L   Alkaline Phosphatase 52 38 - 126 U/L  Total Bilirubin 0.7 0.3 - 1.2 mg/dL   GFR, Estimated >60 >60 mL/min    Comment: (NOTE) Calculated using the CKD-EPI Creatinine Equation (2021)    Anion gap 9 5 - 15    Comment: Performed at [facility], [address] [place], [place] [address]  Protime-INR     Status: Abnormal   Collection Time: 12/16/20  3:19 PM  Result Value Ref Range   Prothrombin Time 16.9 (H) 11.4 - 15.2 seconds   INR 1.4 (H) 0.8 - 1.2    Comment: (NOTE) INR goal varies based on device and disease states. Performed at [facility], [place], [place] [address]   Troponin I (High Sensitivity)     Status: None   Collection Time: 12/16/20  3:19 PM  Result Value Ref Range   Troponin I  (High Sensitivity) 4 <18 ng/L    Comment: (NOTE) Elevated high sensitivity troponin I (hsTnI) values and significant  changes across serial measurements may suggest ACS but many other  chronic and acute conditions are known to elevate hsTnI results.  Refer to the "Links" section for chest pain algorithms and additional  guidance. Performed at [facility], [address] [place], [place] [address]   Magnesium     Status: None   Collection Time: 12/16/20  3:19 PM  Result Value Ref Range   Magnesium 2.1 1.7 - 2.4 mg/dL    Comment: Performed at [facility], [place], [place] [address]  Lactic acid, plasma     Status: Abnormal   Collection Time: 12/16/20  3:54 PM  Result Value Ref Range   Lactic Acid, Venous 2.1 (HH) 0.5 - 1.9 mmol/L    Comment: CRITICAL RESULT CALLED TO, READ BACK BY AND VERIFIED WITH KASSIE FELPS $RemoveBefo'@[email or web address]'[email or web address]$  ON 12/16/20 SKL Performed at [facility], [address] [place], [place] [address]   Lactic acid, plasma     Status: None   Collection Time: 12/16/20  8:27 PM  Result Value Ref Range   Lactic Acid, Venous 1.1 0.5 - 1.9 mmol/L    Comment: Performed at [facility] [place], [address] [place], [place] [address]  Type and screen Ordered by PROVIDER DEFAULT     Status: None   Collection Time: 12/16/20  8:27 PM  Result Value Ref Range   ABO/RH(D) A POS    Antibody Screen NEG    Sample Expiration      12/19/2020,2359 Performed at [facility], [place], [place] [address]   Troponin I (High Sensitivity)     Status: None   Collection Time: 12/16/20  8:27 PM  Result Value Ref Range   Troponin I (High Sensitivity) 5 <18 ng/L    Comment: (NOTE) Elevated high sensitivity troponin I (hsTnI) values and significant  changes across serial measurements may suggest ACS but many other  chronic and acute conditions are known to elevate hsTnI results.  Refer to the "Links" section for chest pain  algorithms and additional  guidance. Performed at [facility], [address], [place], [place] [address]   Basic metabolic panel     Status: Abnormal   Collection Time: 12/17/20  6:20 AM  Result Value Ref Range   Sodium 138 135 - 145 mmol/L   Potassium 3.7 3.5 - 5.1 mmol/L    Comment: HEMOLYSIS AT THIS LEVEL MAY AFFECT RESULT   Chloride 105 98 - 111 mmol/L   CO2 26 22 - 32 mmol/L  Glucose, Bld 105 (H) 70 - 99 mg/dL    Comment: Glucose reference range applies only to samples taken after fasting for at least 8 hours.   BUN 22 8 - 23 mg/dL   Creatinine, Ser 0.56 0.44 - 1.00 mg/dL   Calcium 8.3 (L) 8.9 - 10.3 mg/dL   GFR, Estimated >60 >60 mL/min    Comment: (NOTE) Calculated using the CKD-EPI Creatinine Equation (2021)    Anion gap 7 5 - 15    Comment: Performed at [facility], [address] [place], [place] [address]  CBC     Status: Abnormal   Collection Time: 12/17/20  6:20 AM  Result Value Ref Range   WBC 5.6 4.0 - 10.5 K/uL   RBC 3.67 (L) 3.87 - 5.11 MIL/uL   Hemoglobin 11.1 (L) 12.0 - 15.0 g/dL   HCT 33.4 (L) 36.0 - 46.0 %   MCV 91.0 80.0 - 100.0 fL   MCH 30.2 26.0 - 34.0 pg   MCHC 33.2 30.0 - 36.0 g/dL   RDW 14.2 11.5 - 15.5 %   Platelets 222 150 - 400 K/uL   nRBC 0.0 0.0 - 0.2 %    Comment: Performed at [facility], [address] [place], [place] [address]  Urinalysis, Complete w Microscopic     Status: Abnormal   Collection Time: 12/17/20 10:30 AM  Result Value Ref Range   Color, Urine YELLOW (A) YELLOW   APPearance HAZY (A) CLEAR   Specific Gravity, Urine 1.018 1.005 - 1.030   pH 6.0 5.0 - 8.0   Glucose, UA NEGATIVE NEGATIVE mg/dL   Hgb urine dipstick NEGATIVE NEGATIVE   Bilirubin Urine NEGATIVE NEGATIVE   Ketones, ur NEGATIVE NEGATIVE mg/dL   Protein, ur NEGATIVE NEGATIVE mg/dL   Nitrite NEGATIVE NEGATIVE   Leukocytes,Ua LARGE (A) NEGATIVE   RBC / HPF 0-5 0 - 5 RBC/hpf   WBC, UA 21-50 0 - 5 WBC/hpf   Bacteria, UA FEW  (A) NONE SEEN   Squamous Epithelial / LPF 0-5 0 - 5    Comment: Performed at [facility], [address] [place], [place] [address]  Urine Culture     Status: Abnormal   Collection Time: 12/17/20 10:30 AM   Specimen: Urine, Random  Result Value Ref Range   Specimen Description      URINE, RANDOM Performed at [facility], [address] [place], [place] [address]    Special Requests      NONE Performed at [facility], [address] [place], [place] [address]    Culture >=100,000 COLONIES/mL ESCHERICHIA COLI (A)    Report Status 12/20/2020 FINAL    Organism ID, Bacteria ESCHERICHIA COLI (A)       Susceptibility   Escherichia coli - MIC*    AMPICILLIN 4 SENSITIVE Sensitive     CEFAZOLIN <=4 SENSITIVE Sensitive     CEFEPIME <=0.12 SENSITIVE Sensitive     CEFTRIAXONE <=0.25 SENSITIVE Sensitive     CIPROFLOXACIN <=0.25 SENSITIVE Sensitive     GENTAMICIN <=1 SENSITIVE Sensitive     IMIPENEM <=0.25 SENSITIVE Sensitive     NITROFURANTOIN <=16 SENSITIVE Sensitive     TRIMETH/SULFA <=20 SENSITIVE Sensitive     AMPICILLIN/SULBACTAM <=2 SENSITIVE Sensitive     PIP/TAZO <=4 SENSITIVE Sensitive     * >=100,000 COLONIES/mL ESCHERICHIA COLI  ECHOCARDIOGRAM COMPLETE     Status: None   Collection Time: 12/17/20  7:23 PM  Result Value Ref Range   Weight 2,560  oz   Height 69 in   BP 106/62 mmHg   S' Lateral 3.14 cm   Area-P 1/2 2.99 cm2  CUP PACEART REMOTE DEVICE CHECK     Status: None   Collection Time: 12/28/20 12:57 AM  Result Value Ref Range   Date Time Interrogation Session [id number]    Pulse Generator Manufacturer MERM    Pulse Gen Model [id number] Reveal LINQ    Pulse Gen Serial Number [id number]    Clinic Name [facility]    Implantable Pulse Generator Type ICM/ILR    Implantable Pulse Generator Implant Date 53664403    Eval Rhythm SR at 75 bpm   CUP PACEART REMOTE DEVICE CHECK     Status: None   Collection Time: 01/30/21  1:02 AM   Result Value Ref Range   Date Time Interrogation Session [id number]    Pulse Generator Manufacturer MERM    Pulse Gen Model [id number] Reveal LINQ    Pulse Gen Serial Number [id number]    Clinic Name [facility]    Implantable Pulse Generator Type ICM/ILR    Implantable Pulse Generator Implant Date 16606301    Objective  Body mass index is 25.25 kg/m. Wt Readings from Last 3 Encounters:  02/25/21 172 lb (78 kg)  02/20/21 171 lb (77.6 kg)  12/26/20 160 lb (72.6 kg)   Temp Readings from Last 3 Encounters:  02/20/21 97.8 F (36.6 C) (Skin)  12/26/20 (!) 97.4 F (36.3 C) (Oral)  12/19/20 98.1 F (36.7 C)   BP Readings from Last 3 Encounters:  02/25/21 100/70  02/20/21 (!) 98/58  12/26/20 100/68   Pulse Readings from Last 3 Encounters:  02/25/21 70  02/20/21 80  12/26/20 93    Physical Exam Vitals and nursing note reviewed.  Constitutional:      Appearance: Normal appearance. She is well-developed and well-groomed.  HENT:     Head: Normocephalic and atraumatic.  Eyes:     Conjunctiva/sclera: Conjunctivae normal.     Pupils: Pupils are equal, round, and reactive to light.  Cardiovascular:     Rate and Rhythm: Normal rate and regular rhythm.     Heart sounds: Normal heart sounds. No murmur heard. Pulmonary:     Effort: Pulmonary effort is normal.     Breath sounds: Normal breath sounds.  Skin:    General: Skin is warm and dry.  Neurological:     General: No focal deficit present.     Mental Status: She is alert and oriented to person, place, and time.     Gait: Gait normal.  Psychiatric:        Attention and Perception: Attention and perception normal.        Mood and Affect: Mood and affect normal.        Speech: Speech normal.        Behavior: Behavior normal. Behavior is cooperative.        Thought Content: Thought content normal.        Cognition and Memory: Cognition and memory normal.        Judgment: Judgment normal.    Assessment  Plan  Memory  loss F/u [facility] neurology on aricept 5 mg qd   Depression, recurrent ([name]) Anxiety Attention deficit hyperactivity disorder (ADHD), unspecified ADHD type Insomnia  Will need to f/u Dr. Ree Edman out fl2 today  Longstanding persistent atrial fibrillation [facility]) Transfer care Dr. Caryl Comes in [place] meds eliquis 5 mg bid ans flecainide 100 mg bid   Hypothyroidism,  unspecified type  On levo 25 mcg qd  Last tsh 06/2020   Recurrent uti  Stop topical estrace for now   HM Flu shot utd utd prevnar pna 23 utd Tdap utd shingrix had 2/2 shots covid vx had 4/4 moderna   Pap out of age window  mammo 08/16/20 negative ordered repeat Colonoscopy had 03/06/14 diverticulosis 02/17/16 osteoporosis reordered another dexa + osteoporosis next prolia shot had 10/09/20, ordered repeat dexa due 08/2021 Skin [facility] derm pt saw 12/22/19 Aks face x 3 f/u 1 year Dr. Will Bonnet   Echo had 04/11/2019 ef 60-65%  She is doing [facility] PT at [facility]  GI Dr. Allen Norris Dental Dr. Sandrea Hughs will establish  [facility] neurology  Psych Dr. Tora Kindred paperwork obtained [facility]   Specialist Dr. Rayann Heman, Dr. Lovena Le EP, Dr. Caryl Comes Dr. Clovis Pu psych [facility] Dr. Melrose Nakayama   Provider: Dr. Olivia Mackie McLean-Scocuzza-Internal Medicine

## 2021-02-20 NOTE — Patient Instructions (Addendum)
MD No Physician    Primary Contact Information  Phone Fax E-mail Address  [phone] [email or web address] [place]   [address]   [place] [address]   Dr. Virl Axe    [phone] Not available [place]   [place] [address]    Date Type Specialty Care Team Description  04/01/2021 Office Visit Neurology Melrose Nakayama, Liliane Shi, MD   [place]   [facility]   [place], [place] [address]   [phone] (Work)   [phone] ([address])     [address] [place], [place], [place] [address]   [phone] (Work)   [phone] Joylene Igo)       MD No Psychiatrist    Primary Contact Information  Phone Fax E-mail Address  [phone] Not available [place]   [place] [address]     Specialties     Psychiatry             Dr. Clovis Pu psychiatry   Phone Fax E-mail Address  [phone] Not available [place]   [address]   [place] [address]      Department  Name [facility] [[id number]]

## 2021-02-25 ENCOUNTER — Telehealth: Payer: Self-pay

## 2021-02-25 ENCOUNTER — Ambulatory Visit (INDEPENDENT_AMBULATORY_CARE_PROVIDER_SITE_OTHER): Payer: PPO | Admitting: Internal Medicine

## 2021-02-25 ENCOUNTER — Other Ambulatory Visit: Payer: Self-pay

## 2021-02-25 ENCOUNTER — Encounter: Payer: Self-pay | Admitting: Internal Medicine

## 2021-02-25 VITALS — BP 100/70 | HR 70 | Ht 69.0 in | Wt 172.0 lb

## 2021-02-25 DIAGNOSIS — I480 Paroxysmal atrial fibrillation: Secondary | ICD-10-CM

## 2021-02-25 DIAGNOSIS — Z959 Presence of cardiac and vascular implant and graft, unspecified: Secondary | ICD-10-CM

## 2021-02-25 NOTE — Patient Instructions (Signed)
Medication Instructions:  - Your physician recommends that you continue on your current medications as directed. Please refer to the Current Medication list given to you today.  *If you need a refill on your cardiac medications before your next appointment, please call your pharmacy*   Lab Work: - none ordered  If you have labs (blood work) drawn today and your tests are completely normal, you will receive your results only by: [place] (if you have MyChart) OR A paper copy in the mail If you have any lab test that is abnormal or we need to change your treatment, we will call you to review the results.   Testing/Procedures: - none ordered   Follow-Up: At [facility], you and your health needs are our priority.  As part of our continuing mission to provide you with exceptional heart care, we have created designated Provider Care Teams.  These Care Teams include your primary Cardiologist (physician) and Advanced Practice Providers (APPs -  Physician Assistants and Nurse Practitioners) who all work together to provide you with the care you need, when you need it.  We recommend signing up for the patient portal called "MyChart".  Sign up information is provided on this After Visit Summary.  MyChart is used to connect with patients for Virtual Visits (Telemedicine).  Patients are able to view lab/test results, encounter notes, upcoming appointments, etc.  Non-urgent messages can be sent to your provider as well.   To learn more about what you can do with MyChart, go to [email or web address].    Your next appointment:   6-8 month(s)  The format for your next appointment:   In Person  Provider:   Virl Axe, MD   Other Instructions N/a

## 2021-02-25 NOTE — Progress Notes (Signed)
Patient Care Team: McLean-Scocuzza, Nino Glow, MD as PCP - General (Internal Medicine) Sherran Needs, NP as Nurse Practitioner (Nurse Practitioner) Thompson Grayer, MD as Consulting Physician (Cardiology) Roseanne Kaufman, MD as Consulting Physician (Orthopedic Surgery) Josefine Class, MD as Referring Physician (Gastroenterology) Estill Cotta, MD as Consulting Physician (Ophthalmology)   HPI  Monica Whitaker is a 75 y.o. female Seen in followup-- longstanding pt of JA, PAF with hx of ablation X 2 2014/2020 managed with flecainide, diltiazem and anticoagulation with Apixoban.  B/c of desire to come off anticoagulation ILR implanted to monitor AF burden  ILR reports reviewed  4/22 >> afib NO--- sinus with PACs   The patient denies chest pain, shortness of breath, nocturnal dyspnea, orthopnea or peripheral edema.  There have been no palpitations, lightheadedness or syncope.   Progressive dementia and will be moving into memory care  Date Cr K Hgb  5/22 0.56 3.7 11.1            Thromboembolic risk factors ( age -21, TIA/CVA-2, Gender-1) for a CHADSVASc Score of >=4   Records and Results Reviewed  Past Medical History:  Diagnosis Date   Actinic keratosis 12/20/2012   left nasal tip   Anxiety    controlled with meds   Anxiety    Arrhythmia    Atrial fibrillation ([name])    paroxysmal, failed medical therapy with flecainide,  NSVT with tikosyn   Basal cell carcinoma 12/20/2012   left proximal mandible   Bruises easily    CVA (cerebral vascular accident) ([place]) 12/2007   Depression    medically controlled    Glaucoma    also with macular holes AE Dr. Thomasene Ripple   History of loop recorder    Hypothyroidism    Low blood pressure    no falls, but if gets up to fast   RLS (restless legs syndrome)    Stroke ([place]) 7 years ago   Lasting effects on balance, different personality.   Thyroid disease    Tremor    Ventricular tachycardia ([place])    pt told at  [facility] that she had RVOT VT    Past Surgical History:  Procedure Laterality Date   ATRIAL FIBRILLATION ABLATION  10/18/12   PVI by Dr Rayann Heman   ATRIAL FIBRILLATION ABLATION N/A 10/18/2012   Procedure: ATRIAL FIBRILLATION ABLATION;  Surgeon: Thompson Grayer, MD;  Location: [facility] CATH LAB;  Service: Cardiovascular;  Laterality: N/A;   ATRIAL FIBRILLATION ABLATION N/A 04/18/2019   Procedure: ATRIAL FIBRILLATION ABLATION;  Surgeon: Thompson Grayer, MD;  Location: [facility] CV LAB;  Service: Cardiovascular;  Laterality: N/A;   EUS N/A 10/19/2019   Procedure: FULL UPPER ENDOSCOPIC ULTRASOUND (EUS) RADIAL;  Surgeon: Holly Bodily, MD;  Location: [facility] ENDOSCOPY;  Service: Gastroenterology;  Laterality: N/A;   EYE SURGERY     on L/R eye, macular hole    FRACTURE SURGERY     implantable loop recorder placement  07/13/2019   MDT Reveal LINQ1 [facility] [id number]) implanted in office by Dr Rayann Heman for afib management post ablation   OPEN REDUCTION INTERNAL FIXATION (ORIF) DISTAL RADIAL FRACTURE Right 11/15/2014   Procedure: OPEN REDUCTION INTERNAL FIXATION (ORIF) RIGHT DISTAL RADIAL FRACTURE WITH ALLOGRAFT BONE GRAFT;  Surgeon: Roseanne Kaufman, MD;  Location: [place];  Service: Orthopedics;  Laterality: Right;   PANCREATECTOMY N/A 02/13/2020   Procedure: LAPAROSCOPIC DISTAL PANCREATECTOMY;  Surgeon: Stark Chrislynn Mosely, MD;  Location: [place];  Service: General;  Laterality: N/A;   TEE  WITHOUT CARDIOVERSION N/A 10/18/2012   Procedure: TRANSESOPHAGEAL ECHOCARDIOGRAM (TEE);  Surgeon: Lelon Perla, MD;  Location: [facility] ENDOSCOPY;  Service: Cardiovascular;  Laterality: N/A;  Pre-Ablation at 12pm   TONSILLECTOMY     VAGINAL HYSTERECTOMY      Current Meds  Medication Sig   acetaminophen (TYLENOL) 500 MG tablet Take 1,000 mg by mouth every 6 (six) hours as needed for mild pain or moderate pain.   amphetamine-dextroamphetamine (ADDERALL XR) 10 MG 24 hr capsule Take 1 capsule (10 mg total) by mouth daily.    apixaban (ELIQUIS) 5 MG TABS tablet Take 1 tablet (5 mg total) by mouth in the morning and at bedtime.   B Complex-C (B-COMPLEX WITH VITAMIN C) tablet Take 1 tablet by mouth daily.   blood glucose meter kit and supplies KIT Dispense based on patient and insurance preference. Use up to four times daily as directed. (FOR ICD-9 250.00, 250.01).   Calcium Carb-Cholecalciferol (CALCIUM 600+D3 PO) Take 2 tablets by mouth daily.   Cholecalciferol (VITAMIN D3) 50 MCG (2000 UT) TABS Take 4,000 Units by mouth daily.   CRANBERRY EXTRACT PO Take 2 tablets by mouth every evening.    denosumab (PROLIA) 60 MG/ML SOLN injection Inject 60 mg into the skin every 6 (six) months.    donepezil (ARICEPT) 5 MG tablet Take 5 mg by mouth at bedtime.   DULoxetine (CYMBALTA) 60 MG capsule Take 1 capsule (60 mg total) by mouth 2 (two) times daily.   flecainide (TAMBOCOR) 50 MG tablet Take 100 mg by mouth 2 (two) times daily.   gabapentin (NEURONTIN) 100 MG capsule Take 2 capsules (200 mg total) by mouth 2 (two) times daily. For pill pack and delivery   levothyroxine (SYNTHROID) 25 MCG tablet Take 1 tablet (25 mcg total) by mouth daily before breakfast. 30 minutes before food. For pill pack and delivery   LORazepam (ATIVAN) 0.5 MG tablet TAKE 1 TABLET (0.5 MG TOTAL) BY MOUTH EVERY 8 (EIGHT) HOURS.   melatonin 5 MG TABS Take 2.5-5 mg by mouth at bedtime as needed (sleep).   Multiple Vitamin (MULTIVITAMIN WITH MINERALS) TABS tablet Take 1 tablet by mouth daily.   ondansetron (ZOFRAN) 4 MG tablet Take 4 mg by mouth every 8 (eight) hours as needed for nausea.   pravastatin (PRAVACHOL) 10 MG tablet Take 1 tablet (10 mg total) by mouth at bedtime. Take a whole pill for pill pack and delivery   SALT SUBSTITUTES PO Take 1 tablet by mouth daily as needed (hypotension).   timolol (TIMOPTIC) 0.5 % ophthalmic solution Place 1 drop into the left eye at bedtime.    traMADol (ULTRAM) 50 MG tablet Take 1 tablet (50 mg total) by mouth 2 (two)  times daily as needed (mild pain). Change from [facility] to total care   vitamin C (ASCORBIC ACID) 500 MG tablet Take 1,000 mg by mouth daily.    vitamin E 400 UNIT capsule Take 400 Units by mouth daily.      Allergies  Allergen Reactions   Darvon Other (See Comments)    Severe panic attacks   Propoxyphene Other (See Comments)    GI Upset Severe panic attacks GI Upset   Propoxyphene N-Acetaminophen Other (See Comments)    Severe panic attacks   Levofloxacin Anxiety and Other (See Comments)    Causes panic attacks.      Review of Systems negative except from HPI and PMH  Physical Exam BP 100/70 (BP Location: Left Arm, Patient Position: Sitting, Cuff Size: Normal)  Pulse 70   Ht _0  (1.753 m)   Wt 172 lb (78 kg)   SpO2 92%   BMI 25.40 kg/m  Well developed and well nourished in no acute distress HENT normal E scleral and icterus clear Neck Supple JVP flat; carotids brisk and full Clear to ausculation  Regular rate and rhythm, no murmurs gallops or rub Soft with active bowel sounds No clubbing cyanosis  Edema Alert   grossly normal motor and sensory function Skin Warm and Dry  ECG sinus @ 70 18/10/40  CrCl cannot be calculated (Patient's most recent lab result is older than the maximum 21 days allowed.).   Assessment and  Plan  Atrial fibrillation s/p ablation x 2  Dementia progressive  Flecainide   Tolerating flec without QRS prolongation; will continue.  Discussions in the past were to think about downtitration-- I will wait to see the trajectory of her dementia prior to making such a med change  continue 100 mg bid  Distinct from [place] in the past, I would not be inclined to use the ILR to inform decision regarding the stopping of anticoagulation so will stop monitoring the LINQ going forward  Tolerating Apixoban  for anticoagulation will continue apixoban 5 bid -- last Hgb a little lower ( 12.9>>11.1        Current medicines are reviewed at length with  the patient today .  The patient does not have concerns regarding medicines.

## 2021-02-25 NOTE — Telephone Encounter (Signed)
Per Dr. Caryl Comes request the patient is no longer being remote monitored. She is going into memory care.

## 2021-02-26 ENCOUNTER — Telehealth: Payer: Self-pay | Admitting: Internal Medicine

## 2021-02-26 NOTE — Telephone Encounter (Signed)
Pt c/o medication issue:  1. Name of Medication: unknown  2. How are you currently taking this medication (dosage and times per day)?   3. Are you having a reaction (difficulty breathing--STAT)?   4. What is your medication issue? [facility] nurse is calling to clarify medications that pt is supposed to continue after visit yesterday. Nurse states the instructions were hand written on a consult document returned to the facility.   Please advise

## 2021-02-26 NOTE — Telephone Encounter (Signed)
I called and spoke with Monica Whitaker at [facility]. She advised that Dr. Caryl Comes had filled out their consultation paper work for the patient, but she could not read the 3 medications he listed on there for her to continue.  I advised Dr. Caryl Comes made no changes to the patient's medications yesterday, but I did not see the paperwork that he filled out to go back to [facility].  I have asked Monica Whitaker to please fax this to me so I can look at it and I will call her back.  Monica Whitaker also advised the patient had been on a Sodium Choloride tablet for her low blood pressures, but she is not taking this any more. She was wanting to make sure Dr. Caryl Comes addressed the chronically low BP's. I advised Monica Whitaker that Dr. Olin Pia note was not done yet, so I cannot speak to this, but I should be speaking with him later today and will try to clarify and call her back.  Monica Whitaker advised she leaves at 2:45 pm today.

## 2021-02-27 DIAGNOSIS — F909 Attention-deficit hyperactivity disorder, unspecified type: Secondary | ICD-10-CM | POA: Insufficient documentation

## 2021-02-27 DIAGNOSIS — I4811 Longstanding persistent atrial fibrillation: Secondary | ICD-10-CM | POA: Insufficient documentation

## 2021-02-27 DIAGNOSIS — F339 Major depressive disorder, recurrent, unspecified: Secondary | ICD-10-CM | POA: Insufficient documentation

## 2021-03-03 ENCOUNTER — Other Ambulatory Visit: Payer: PPO

## 2021-03-05 ENCOUNTER — Ambulatory Visit: Payer: PPO | Admitting: Internal Medicine

## 2021-03-05 NOTE — Telephone Encounter (Signed)
Late entry: I was able to review the patient's chart with Dr. Caryl Comes at the end of last week.  Hand written orders that were faxed back from [facility] that Dr. Caryl Comes had completed.  I advised Dr. Caryl Comes, these orders read: 1) Continue flecainide 2) Continue cardizem 3) Continue eliquis  I advised Dr. Caryl Comes that we did not have on file that the patient was taking cardizem. After Dr. Caryl Comes reviewed the patient's chart further, he did find that diltiazem 240 mg was stopped at hospital discharge for the patient on 12/19/20. The patient will need to take this with flecainide, but I have reported to Dr. Caryl Comes that her BPs are running low per [facility] nurse.  Per Dr. Caryl Comes, a discussion will need to be had with the patient's POA as to possible medication changes in regards to the flecainide.  Dr. Caryl Comes was given #'s for the patient's POA- Althia Forts to call and discuss: [phone]  Dr. Caryl Comes was out of the office at the end of the week last week, but I was able to speak with him at the end of day yesterday and he advised that he was unable to reach the patient's POA at either number.   He advised that I call [facility] and let them know no medication changes will be made until we can reach the patient's POA and discuss further.  I attempted to call Robin back at [facility] [phone]. Per staff answering the phone, the patient has been moved to Memory Care. I was given the director's number, Amy, to call at [phone]. No answer- message stated the call could not be completed at this time.  Called the [employer] at [facility] [phone] and no answer.  Called the Memory Care # that I could find online [phone].  No answer- but a generic voice mail was available and I left a message to please call back and ask for me as I did not leave the patient's name on the unidentified voice mail.

## 2021-03-06 ENCOUNTER — Other Ambulatory Visit: Payer: Self-pay | Admitting: Psychiatry

## 2021-03-06 DIAGNOSIS — F419 Anxiety disorder, unspecified: Secondary | ICD-10-CM

## 2021-03-10 ENCOUNTER — Other Ambulatory Visit: Payer: Self-pay | Admitting: General Surgery

## 2021-03-11 ENCOUNTER — Other Ambulatory Visit: Payer: Self-pay | Admitting: Internal Medicine

## 2021-03-11 DIAGNOSIS — E039 Hypothyroidism, unspecified: Secondary | ICD-10-CM

## 2021-03-11 NOTE — Telephone Encounter (Signed)
No call back from [facility] today. I called back to the main # [phone]. I asked for Memory Care and was directed there. The person answering the phone advised she would have to transfer me again to the "[facility] [employer].  I was transferred and a voice mail picked up for [place]. I left a message that I was trying to reach someone in regards to the patient.  We need to clarify who they have on file as her POA/ contact information. Dr. Caryl Comes needs to discuss the patient's medications with the POA as the patient is on flecainide, but not currently on diltiazem. Further discussion will need to be had regarding the patient's medications and how to proceed with these going forward.   If we receive a call back from [facility], we need to obtain a good call back # for the Memory Care unit there to avoid being transferred multiple times.   I have a copy of a faxed order that Dr. Caryl Comes filled out at her last office visit in Dr. Olin Pia yellow folder on my cart.

## 2021-03-14 ENCOUNTER — Other Ambulatory Visit: Payer: Self-pay | Admitting: General Surgery

## 2021-03-14 DIAGNOSIS — D378 Neoplasm of uncertain behavior of other specified digestive organs: Secondary | ICD-10-CM

## 2021-03-14 DIAGNOSIS — D490 Neoplasm of unspecified behavior of digestive system: Secondary | ICD-10-CM

## 2021-03-19 NOTE — Telephone Encounter (Signed)
Error

## 2021-03-20 NOTE — Telephone Encounter (Signed)
I spoke with Dr. Caryl Comes regarding my difficulties in trying to reach any staff at [facility] to try to assist in discussing Ms. Rensch medications/ POA situation.  Dr. Caryl Comes called the [place] and asked for the Mudlogger of nursing for Memory Care. We were able to speak with Amy- Director and were able to discuss the patient's situation with her and also with the Education officer, museum at [facility], [place].  Per Raquel Sarna, the patient's current POA, Althia Forts is stepping down from this role on 04/03/21 and they are in the process of finding out who her POA will be.  She has someone designated to manage her finances, but Dr. Caryl Comes advised that will not be the appropriate person to discuss her medical care with.  Raquel Sarna advised that guardianship may need to eventually be obtained for the patient, but this could take months.   Raquel Sarna advised she will email, Althia Forts [facility]) and ask her to please contact Dr. Caryl Comes directly to discuss the patient's care.   I will follow up with Dr. Caryl Comes about this.   We were given the following contact #'s for [facility]: Sande Rives (social work)- [phone] Amy ([place])- [phone] Nursing Floor- [phone] / [phone]

## 2021-03-24 NOTE — Telephone Encounter (Signed)
Lengthy discussion with the POA, Mrs. Whitehead, last week.  She is disinclined to approve any medication changes as she will be withdrawing from her role as POA.  Hence, we will have to wait until her guardian is assigned.  We may be can have a Education officer, museum discussion again as to whether a decision can be made unilaterally.  Heather Thanks SK

## 2021-03-26 NOTE — Telephone Encounter (Signed)
Charlyne Mom calling from [facility] wanting to give fax number [phone] for patient medication changes.

## 2021-03-28 ENCOUNTER — Telehealth: Payer: Self-pay

## 2021-03-28 NOTE — Telephone Encounter (Signed)
[facility] order summary report has been confirmed faxed and sent to scan

## 2021-04-01 DIAGNOSIS — R413 Other amnesia: Secondary | ICD-10-CM | POA: Diagnosis not present

## 2021-04-01 DIAGNOSIS — Z8673 Personal history of transient ischemic attack (TIA), and cerebral infarction without residual deficits: Secondary | ICD-10-CM | POA: Diagnosis not present

## 2021-04-01 DIAGNOSIS — R251 Tremor, unspecified: Secondary | ICD-10-CM | POA: Diagnosis not present

## 2021-04-01 DIAGNOSIS — F339 Major depressive disorder, recurrent, unspecified: Secondary | ICD-10-CM | POA: Diagnosis not present

## 2021-04-01 NOTE — Telephone Encounter (Signed)
Dr. Caryl Comes, I am unsure if there are any medication changes I can fax at this time. Thoughts?

## 2021-04-03 ENCOUNTER — Telehealth: Payer: Self-pay

## 2021-04-03 NOTE — Telephone Encounter (Signed)
Confirmed faxed orders to [facility] [employer]. Signed orders have been sent to scan.

## 2021-04-04 NOTE — Telephone Encounter (Signed)
I called and spoke with Amy (DON) at [place] at [facility].  I have advised her Dr. Caryl Comes received the message from Charlyne Mom regarding faxing any med orders for Ms. Morcom to the number below. However, I advised Amy that Dr. Caryl Comes was finally able to speak with Ms. Larena Glassman (current POA) and she was not wanting to make any decisions regarding the patient's care at this time as she will be stepping down as her POA.  I inquired from Amy if she knew the current status on the situation and she did advised they sat down with Ms. Whitehead last week during a POC meeting and they are trying to find/ appoint an different POA for the patient.  I advised Amy that until that time, we are unable to make any medication changes for the patient. I have asked Amy if she would please keep [place] updated if the patient has a new POA appointed so Dr. Caryl Comes may have discussions with them about the patient's medications.  Amy was very willing to keep [place] updated & she will notify the social worker as well, so one of them can touch base with [place] when a new POA is in place.   Will forward to Dr. Caryl Comes to review.

## 2021-04-15 ENCOUNTER — Telehealth: Payer: Self-pay

## 2021-04-15 NOTE — Telephone Encounter (Signed)
Confirmed faxed signed [facility] physician communication sheet. Signed form has been sent to scan.

## 2021-04-16 ENCOUNTER — Telehealth: Payer: Self-pay

## 2021-04-16 NOTE — Telephone Encounter (Signed)
Confirmed faxed [facility] Physician communication sheet with patient orders. Signed form has been sent to scan.

## 2021-04-17 ENCOUNTER — Ambulatory Visit
Admission: RE | Admit: 2021-04-17 | Discharge: 2021-04-17 | Disposition: A | Discharge status: Home / Self Care | Payer: PPO | Source: Ambulatory Visit | Attending: General Surgery | Admitting: General Surgery

## 2021-04-17 ENCOUNTER — Other Ambulatory Visit: Payer: Self-pay

## 2021-04-17 DIAGNOSIS — D490 Neoplasm of unspecified behavior of digestive system: Secondary | ICD-10-CM

## 2021-04-17 DIAGNOSIS — D378 Neoplasm of uncertain behavior of other specified digestive organs: Secondary | ICD-10-CM

## 2021-04-17 DIAGNOSIS — K862 Cyst of pancreas: Secondary | ICD-10-CM | POA: Diagnosis not present

## 2021-04-17 DIAGNOSIS — K5730 Diverticulosis of large intestine without perforation or abscess without bleeding: Secondary | ICD-10-CM | POA: Diagnosis not present

## 2021-04-17 DIAGNOSIS — Z9889 Other specified postprocedural states: Secondary | ICD-10-CM | POA: Diagnosis not present

## 2021-04-17 DIAGNOSIS — J984 Other disorders of lung: Secondary | ICD-10-CM | POA: Diagnosis not present

## 2021-04-17 MED ORDER — IOPAMIDOL (ISOVUE-300) INJECTION 61%
100.0000 mL | Freq: Once | INTRAVENOUS | Status: AC | PRN
  Administered 2021-04-17: 100 mL via INTRAVENOUS

## 2021-04-22 ENCOUNTER — Telehealth: Payer: Self-pay | Admitting: Family

## 2021-04-22 NOTE — Telephone Encounter (Signed)
Call patient She currently lives at [facility] in memory care it appears.  I received a report of consultation at the CT a/p 04/17/21 was ordered by Dr. Barry Dienes.  I wanted to ensure that patient was aware of results and Dr. Barry Dienes had reviewed them with her.  I cannot see any notes regarding this.     If patient needs results or interpretation of CT a/p results, please schedule appointment with [place]

## 2021-04-24 NOTE — Telephone Encounter (Signed)
Called to speak with [facility] and the call was answered through [facility]. Spoke with a representative and was told that they have not received any records for Monica Whitaker CT scan sent to their facility. They ask if we have a copy of the results and if they could be faxed over to [phone]. Did not see a copy of the CT scan in Epic. Pt has been scheduled to See Joycelyn Schmid on 05/12/2021.

## 2021-05-05 ENCOUNTER — Encounter: Payer: Self-pay | Admitting: Psychiatry

## 2021-05-05 ENCOUNTER — Telehealth: Payer: Self-pay | Admitting: Family

## 2021-05-05 ENCOUNTER — Other Ambulatory Visit: Payer: Self-pay

## 2021-05-05 ENCOUNTER — Ambulatory Visit (INDEPENDENT_AMBULATORY_CARE_PROVIDER_SITE_OTHER): Payer: PPO | Admitting: Psychiatry

## 2021-05-05 ENCOUNTER — Telehealth: Payer: Self-pay | Admitting: Internal Medicine

## 2021-05-05 VITALS — BP 99/61 | HR 71

## 2021-05-05 DIAGNOSIS — F331 Major depressive disorder, recurrent, moderate: Secondary | ICD-10-CM

## 2021-05-05 DIAGNOSIS — F411 Generalized anxiety disorder: Secondary | ICD-10-CM

## 2021-05-05 DIAGNOSIS — G3184 Mild cognitive impairment, so stated: Secondary | ICD-10-CM | POA: Diagnosis not present

## 2021-05-05 DIAGNOSIS — F908 Attention-deficit hyperactivity disorder, other type: Secondary | ICD-10-CM

## 2021-05-05 MED ORDER — AMPHETAMINE-DEXTROAMPHET ER 10 MG PO CP24: 10.0000 mg | ORAL_CAPSULE | Freq: Every day | ORAL | 0 refills | Status: DC

## 2021-05-05 NOTE — Telephone Encounter (Signed)
Pt c/o BP issue: STAT if pt c/o blurred vision, one-sided weakness or slurred speech  1. What are your last 5 BP readings? 92/59; 86/48  2. Are you having any other symptoms (ex. Dizziness, headache, blurred vision, passed out)? Dizziness, lightheadedness  3. What is your BP issue? low   Ask that you call the patient back instead

## 2021-05-05 NOTE — Telephone Encounter (Signed)
Called [facility] and spoke with Monica Whitaker. Monica Whitaker states that Monica Whitaker blood pressure today was 92/59 and the lowest they have seen recently was 86/48. Pt is complaining about being light headed and dizzy. Pt is not having any fevers, dysuria, synocopal episodes, or worsening of mental status.

## 2021-05-05 NOTE — Telephone Encounter (Signed)
Call Adriana Reams LPN at [facility] [phone] [place] fax about low blood pressure and Im very concerned She follows with Dr Caryl Comes cardiology for atrial fib.   Is patient complaining of dizziness, lightheadedness, fever, dysuria, worsening of mental status?  Has she had a syncopal episode?  Im concerned and would like her to have an appt with cardiology. Please call [facility] and schedule.  Please make dr Olin Pia office aware that patient is having low blood pressure

## 2021-05-05 NOTE — Telephone Encounter (Signed)
Spoke with Andi Hence with [facility] where the pt resides re: a message sent to [place] form the pt PCP office... pt has been having low BP running consistently: 92/59; 86/48.   Pt has parameters in place when she was admitted to their facility to call for BP lower than 90/60 and to give her a NA tablet from her PCP Dr. Terese Door but message was sent to Dr. Caryl Comes for further recommendations and to make her an appt with Cardiology.   Pt is fairly asymptomatic... she only complains of a headache.    She is only taking melatonin as a new ned for sleep and only PRN.. she is not on any BP meds. She says she has been eating and drinking well... she says the NA tabs do not seem to make a difference.   I have asked her to push extra fluids and be sure the pt has her feet elevated fairly high if not too uncomfortable. To continue to monitor her BP and it would be helpful to have a full list for the MD to review.   I will forward to Dr. Caryl Comes for review and his nurse to possibly make her an appt.

## 2021-05-05 NOTE — Progress Notes (Signed)
Monica Whitaker [id number] [birth year] 75 y.o.  Subjective:   Patient ID:  Monica Whitaker is a 75 y.o. (DOB 15-Aug-1945) female.  Chief Complaint:  Chief Complaint  Patient presents with   Follow-up   Depression   ADHD   Memory Loss    Monica Whitaker presents today for follow-up of worsening depression and anxiety over care of dying and now deceased husband.    seen 07/15/18.  No med changes were made at that visit.  Counseling helped a lot and she feels much better at this time.  Much more functional and productive.  More outgoing.  11/2018 appt noted following without med changes: After 5 months depression lifted after [name] died.  No deaths from Covid.   Mood is a lot better.  Sleep is OK.  2 one mile walks daily.  Had party this weekend.    09/09/2020 appointment with following noted: Patient has had a lot of serious medical problems accounting for the long delay since the last appointment.  Hx pancreatic CA by her report.  Afib is better. No Covid.  Travelled.   Mentally pretty decent.  Hard stuck at [facility]: [facility] for 2 years.  Went to visit friend in [place] and felt much better and so will travel more.  Lonesome bc no good friends. Can't move bc feels tied to [facility] financially. Feels better and cognitively better with stimulant. Plan no med changes  05/05/21 appt noted: [facility] and tedious place.  Doesn't like small room.  Looking for better room there. Does have friends there with whom she can converse. Ups and downs in mood.  Got attention from someone who said he loved her and then he changed his mind. Losing weight intentionally.  Not eating meat.  Eggs are OK.   No SE. No naps. No family around but does have family.  But inherited a good amount of money but being restricted on what she can spend.  Reduced freedom.  Patient reports stable mood and denies depressed or irritable moods.  Patient denies any recent difficulty with anxiety.  Patient  denies difficulty with sleep initiation or maintenance. Denies appetite disturbance.  Patient reports that energy and motivation have been good.  Patient denies any worsening memory or concentration.  Patient denies any suicidal ideation.  Hospice counselor Earnest Bailey was excellent and very helpful.  Past Psychiatric Medication Trials: Pramipexole Duloxetine 120 Adderall or dexedrine. Donepezil 10   Review of Systems:  Review of Systems  Neurological:  Negative for tremors and weakness.  Psychiatric/Behavioral:  Negative for agitation, behavioral problems, confusion, decreased concentration, dysphoric mood, hallucinations, self-injury, sleep disturbance and suicidal ideas. The patient is not nervous/anxious and is not hyperactive.    Medications: I have reviewed the patient's current medications.  Current Outpatient Medications  Medication Sig Dispense Refill   acetaminophen (TYLENOL) 500 MG tablet Take 1,000 mg by mouth every 6 (six) hours as needed for mild pain or moderate pain.     amphetamine-dextroamphetamine (ADDERALL XR) 10 MG 24 hr capsule Take 1 capsule (10 mg total) by mouth daily. 90 capsule 0   apixaban (ELIQUIS) 5 MG TABS tablet Take 1 tablet (5 mg total) by mouth in the morning and at bedtime. 60 tablet 0   B Complex-C (B-COMPLEX WITH VITAMIN C) tablet Take 1 tablet by mouth daily.     blood glucose meter kit and supplies KIT Dispense based on patient and insurance preference. Use up to four times daily as  directed. (FOR ICD-9 250.00, 250.01). 1 each 0   Calcium Carb-Cholecalciferol (CALCIUM 600+D3 PO) Take 2 tablets by mouth daily.     Cholecalciferol (VITAMIN D3) 50 MCG (2000 UT) TABS Take 4,000 Units by mouth daily.     CRANBERRY EXTRACT PO Take 2 tablets by mouth every evening.      denosumab (PROLIA) 60 MG/ML SOLN injection Inject 60 mg into the skin every 6 (six) months.      DULoxetine (CYMBALTA) 60 MG capsule TAKE 1 CAPSULE BY MOUTH 2 TIMES DAILY. 180 capsule 1    flecainide (TAMBOCOR) 50 MG tablet Take 100 mg by mouth 2 (two) times daily.     gabapentin (NEURONTIN) 100 MG capsule Take 2 capsules (200 mg total) by mouth 2 (two) times daily. For pill pack and delivery 360 capsule 3   levothyroxine (SYNTHROID) 25 MCG tablet TAKE 1 TABLET (25 MCG TOTAL) BY MOUTH DAILY BEFORE BREAKFAST. 30 MINUTES BEFORE FOOD 90 tablet 3   LORazepam (ATIVAN) 0.5 MG tablet TAKE 1 TABLET (0.5 MG TOTAL) BY MOUTH EVERY 8 (EIGHT) HOURS. 90 tablet 5   melatonin 5 MG TABS Take 2.5-5 mg by mouth at bedtime as needed (sleep).     Multiple Vitamin (MULTIVITAMIN WITH MINERALS) TABS tablet Take 1 tablet by mouth daily.     ondansetron (ZOFRAN) 4 MG tablet Take 4 mg by mouth every 8 (eight) hours as needed for nausea.     pravastatin (PRAVACHOL) 10 MG tablet Take 1 tablet (10 mg total) by mouth at bedtime. Take a whole pill for pill pack and delivery 90 tablet 3   SALT SUBSTITUTES PO Take 1 tablet by mouth daily as needed (hypotension).     timolol (TIMOPTIC) 0.5 % ophthalmic solution Place 1 drop into the left eye at bedtime.      vitamin E 400 UNIT capsule Take 400 Units by mouth daily.       donepezil (ARICEPT) 5 MG tablet Take 10 mg by mouth at bedtime.     No current facility-administered medications for this visit.    Medication Side Effects: None  Allergies:  Allergies  Allergen Reactions   Darvon Other (See Comments)    Severe panic attacks   Propoxyphene Other (See Comments)    GI Upset Severe panic attacks GI Upset   Propoxyphene N-Acetaminophen Other (See Comments)    Severe panic attacks   Levofloxacin Anxiety and Other (See Comments)    Causes panic attacks.    Past Medical History:  Diagnosis Date   Actinic keratosis 12/20/2012   left nasal tip   Anxiety    controlled with meds   Anxiety    Arrhythmia    Atrial fibrillation ([name])    paroxysmal, failed medical therapy with flecainide,  NSVT with tikosyn   Basal cell carcinoma 12/20/2012   left proximal  mandible   Bruises easily    CVA (cerebral vascular accident) ([place]) 12/2007   Depression    medically controlled    Glaucoma    also with macular holes AE Dr. Thomasene Ripple   History of loop recorder    Hypothyroidism    Low blood pressure    no falls, but if gets up to fast   RLS (restless legs syndrome)    Stroke ([place]) 7 years ago   Lasting effects on balance, different personality.   Thyroid disease    Tremor    Ventricular tachycardia    pt told at [facility] that she had RVOT VT    Family  History  Problem Relation Age of Onset   Multiple sclerosis Father    Breast cancer Neg Hx     Social History   Socioeconomic History   Marital status: Widowed    Spouse name: Not on file   Number of children: 0   Years of education: Not on file   Highest education level: Not on file  Occupational History   Occupation: Retired    Fish farm manager: RETIRED  Tobacco Use   Smoking status: Never   Smokeless tobacco: Never  Vaping Use   Vaping Use: Never used  Substance and Sexual Activity   Alcohol use: Yes    Alcohol/week: 2.0 standard drinks    Types: 2 Standard drinks or equivalent per week    Comment: regular   Drug use: No   Sexual activity: Yes  Other Topics Concern   Not on file  Social History Narrative   Lives in [place] with her husband.  No children 2 step kids.  Former Psychologist, prison and probation services   Enjoys exercise- water classes, yoga Editor, commissioning.    Widowed 2019    Only child and mother was only child   Used to sail with husband      Has a living will-    Would desire CPR   Would not want prolonged life support if futile.      Has 1 cat lives alone no kids but see above    Social Determinants of Health   Financial Resource Strain: Not on file  Food Insecurity: Not on file  Transportation Needs: Not on file  Physical Activity: Not on file  Stress: Not on file  Social Connections: Not on file  Intimate Partner Violence: Not on file    Past Medical History, Surgical  history, Social history, and Family history were reviewed and updated as appropriate.   Please see review of systems for further details on the patient's review from today.   Objective:   Physical Exam:  BP 99/61   Pulse 71   Physical Exam Constitutional:      General: She is not in acute distress.    Appearance: She is well-developed.  Musculoskeletal:        General: No deformity.  Neurological:     Mental Status: She is alert and oriented to person, place, and time.     Motor: No tremor.     Coordination: Coordination normal.     Gait: Gait normal.  Psychiatric:        Attention and Perception: She is attentive.        Mood and Affect: Mood is not anxious or depressed. Affect is not labile, blunt, angry, tearful or inappropriate.        Speech: Speech normal.        Behavior: Behavior normal. Behavior is not slowed.        Thought Content: Thought content normal. Thought content does not include homicidal or suicidal ideation. Thought content does not include homicidal or suicidal plan.        Cognition and Memory: She exhibits impaired recent memory.        Judgment: Judgment normal.     Comments: Insight and judgment good.   Pleasant.  Doesn't know her age. No auditory or visual hallucinations. No delusions.    Lab Review:     Component Value Date/Time   NA 138 12/17/2020 0620   NA 139 08/20/2017 1619   NA 136 11/07/2014 1539   K 3.7 12/17/2020 9470  K 4.3 11/07/2014 1539   CL 105 12/17/2020 0620   CL 100 (L) 11/07/2014 1539   CO2 26 12/17/2020 0620   CO2 28 11/07/2014 1539   GLUCOSE 105 (H) 12/17/2020 0620   GLUCOSE 107 (H) 11/07/2014 1539   BUN 22 12/17/2020 0620   BUN 17 08/20/2017 1619   BUN 22 (H) 11/07/2014 1539   CREATININE 0.56 12/17/2020 0620   CREATININE 0.60 11/07/2014 1539   CALCIUM 8.3 (L) 12/17/2020 0620   CALCIUM 9.2 11/07/2014 1539   PROT 6.4 (L) 12/16/2020 1519   ALBUMIN 3.7 12/16/2020 1519   AST 31 12/16/2020 1519   ALT 26 12/16/2020  1519   ALKPHOS 52 12/16/2020 1519   BILITOT 0.7 12/16/2020 1519   GFRNONAA >60 12/17/2020 0620   GFRNONAA >60 11/07/2014 1539   GFRAA >60 02/17/2020 0702   GFRAA >60 11/07/2014 1539       Component Value Date/Time   WBC 5.6 12/17/2020 0620   RBC 3.67 (L) 12/17/2020 0620   HGB 11.1 (L) 12/17/2020 0620   HGB 13.0 08/20/2017 1619   HCT 33.4 (L) 12/17/2020 0620   HCT 38.1 08/20/2017 1619   PLT 222 12/17/2020 0620   PLT 286 08/20/2017 1619   MCV 91.0 12/17/2020 0620   MCV 89 08/20/2017 1619   MCV 92 11/07/2014 1539   MCH 30.2 12/17/2020 0620   MCHC 33.2 12/17/2020 0620   RDW 14.2 12/17/2020 0620   RDW 14.1 08/20/2017 1619   RDW 13.9 11/07/2014 1539   LYMPHSABS 1.7 12/16/2020 1519   MONOABS 0.9 12/16/2020 1519   EOSABS 0.1 12/16/2020 1519   BASOSABS 0.1 12/16/2020 1519    No results found for: POCLITH, LITHIUM   No results found for: PHENYTOIN, PHENOBARB, VALPROATE, CBMZ   .res Assessment: Plan:    Major depressive disorder, recurrent episode, moderate ([name])  Attention deficit hyperactivity disorder (ADHD), other type  Generalized anxiety disorder  Mild cognitive impairment acquired after stroke.    Reviewed med list from [facility]. Donepizil was increased.  Agree with that change.  Benefit Dexedrine XR or Adderall XR for cost.  Wants to continue  it DT benefit.  She's been switched to Adderall 10 mg AM.  Would prefer it be changed to Adderall XR 10 mg AM to extend duration and smooth response and reduce SE risk.  Done well with duloxetine 120 mg daily. Continue duloxetine and no med changes.  Forgets occ.  Disc withdrawal.  MCI DT strokes.  Supportive therapy on loss of independence.  FU 6 mos  Lynder Parents, MD, DFAPA   Please see After Visit Summary for patient specific instructions.  Future Appointments  Date Time Provider [place]  05/12/2021 12:00 PM Burnard Hawthorne, FNP [facility]  06/10/2021  1:00 PM Cottle, Billey Co., MD [facility]  None  08/15/2021 12:30 PM Kings ADVISOR [facility]    No orders of the defined types were placed in this encounter.     -------------------------------

## 2021-05-05 NOTE — Telephone Encounter (Signed)
Called [facility] and spoke with Schawana. She took the message and states that she will relay it to a nurse on staff.

## 2021-05-06 NOTE — Telephone Encounter (Signed)
Aide, Wojnar K - 05/05/2021  3:27 PM Deboraha Sprang, MD [phone])  SentMolli Knock May 05, 2021  8:58 PM  To: Stephani Police, RN; Emily Filbert, RN          Message  H  I guess the issue is who will be able to make decisions and who has that authority   there is no need for [place] to see her -- and I would have the MD of the facility be the one involved  Thanks SK

## 2021-05-06 NOTE — Telephone Encounter (Signed)
I called and spoke with the patient's nurse, Charlyne Mom, at [facility].  I advised Manuela Schwartz of the messages below and that Dr. Caryl Comes did not feel he could make changes for the patient at this time since her POA is uncertain in the setting of the patient's memory issues.  I inquired if there was a physician in that unit that could follow up with her. Per Manuela Schwartz, Dr. Silvio Pate covers the facility, but the patient has refused to see him due to something in the past related to her husband.   Per Manuela Schwartz, there is a possibility that the patient could see Webb Silversmith, NP in the facility, if the patient is agreeable. Manuela Schwartz advised that Rollene Fare could possibly manage the issue with her blood pressure.   The patient currently has an appointment with Mable Paris, NP on 05/12/21. I advised Manuela Schwartz, I will include Joycelyn Schmid and Rollene Fare on this note to advise of Dr. Olin Pia recommendations.  I inquired how the patient was doing at this time and per Manuela Schwartz, she is still having low BP's and they are giving her Na tablets that will push her readings up a little, but then she is right back down. I inquired if she has fallen and Manuela Schwartz advised the patient fell ~ 1 week ago and hit her knees. She does complain of intermittent dizziness.  To Mable Paris, NP, Webb Silversmith, NP & Dr. Caryl Comes

## 2021-05-06 NOTE — Telephone Encounter (Signed)
I will check on her tomorrow afternoon

## 2021-05-07 NOTE — Telephone Encounter (Signed)
Webb Silversmith, NP to check on patient at facility per chart review I see her 05/12/21

## 2021-05-07 NOTE — Telephone Encounter (Signed)
Thanks regina Let me know if you need anything from my end I see her 05/12/21

## 2021-05-07 NOTE — Progress Notes (Signed)
Subjective:    Patient ID: Monica Whitaker, female    DOB: [birth year], 75 y.o.   MRN: [id number]  CC: Monica Whitaker is a 75 y.o. female who presents today for follow up.   HPI: CNA accompanies patient  Here today to discuss CT a/p 9/  Complains that Left  great toenail is thickened, discolored.  She is interested in podiatry referral.  She has not tried any medications for this.  No fever, redness  Blood pressure remains low. She is not on blood pressure medication.  Occasionally she will feel dizzy.  No syncopal episodes, CP.  She feels well today.    She is on sodium chloride tablet daily.  She doesn't care for salt in food  CNA with patient and reports blood pressure lying 86/56 , HR 66 an standing 98/57, HR 82 on 05/08/21    Hypotension compliant with salt tabs  Notified Dr Caryl Comes  regarding hypotension didn't make medication changes  Follows with neurlogy for dementia, h/o CVA- she is compliant with with donepizil.  History of atrial fibrillation-compliant with Eliquis 5 mg twice daily  She follows with Dr Clovis Pu for depression , last seen 05/05/21  She [place]  CT abdomen and pelvis 04/17/21  Findings of distal pancreatectomy.  Recommended enhanced MRI to further characterize lesion.  Cystic lesion pancreatic body, unchanged.  Sigmoid diverticulosis without evidence of acute diverticulitis. Ordered by  Dr.Byerly - central [place] surgery.  HISTORY:  Past Medical History:  Diagnosis Date   Actinic keratosis 12/20/2012   left nasal tip   Anxiety    controlled with meds   Anxiety    Arrhythmia    Atrial fibrillation ([name])    paroxysmal, failed medical therapy with flecainide,  NSVT with tikosyn   Basal cell carcinoma 12/20/2012   left proximal mandible   Bruises easily    CVA (cerebral vascular accident) ([place]) 12/2007   Depression    medically controlled    Glaucoma    also with macular holes AE Dr. Thomasene Ripple   History of loop recorder     Hypothyroidism    Low blood pressure    no falls, but if gets up to fast   RLS (restless legs syndrome)    Stroke ([place]) 7 years ago   Lasting effects on balance, different personality.   Thyroid disease    Tremor    Ventricular tachycardia    pt told at [facility] that she had RVOT VT   Past Surgical History:  Procedure Laterality Date   ATRIAL FIBRILLATION ABLATION  10/18/12   PVI by Dr Rayann Heman   ATRIAL FIBRILLATION ABLATION N/A 10/18/2012   Procedure: ATRIAL FIBRILLATION ABLATION;  Surgeon: Thompson Grayer, MD;  Location: [facility] CATH LAB;  Service: Cardiovascular;  Laterality: N/A;   ATRIAL FIBRILLATION ABLATION N/A 04/18/2019   Procedure: ATRIAL FIBRILLATION ABLATION;  Surgeon: Thompson Grayer, MD;  Location: [facility] CV LAB;  Service: Cardiovascular;  Laterality: N/A;   EUS N/A 10/19/2019   Procedure: FULL UPPER ENDOSCOPIC ULTRASOUND (EUS) RADIAL;  Surgeon: Holly Bodily, MD;  Location: [facility] ENDOSCOPY;  Service: Gastroenterology;  Laterality: N/A;   EYE SURGERY     on L/R eye, macular hole    FRACTURE SURGERY     implantable loop recorder placement  07/13/2019   MDT Reveal LINQ1 [facility] [id number]) implanted in office by Dr Rayann Heman for afib management post ablation   OPEN REDUCTION INTERNAL FIXATION (ORIF) DISTAL RADIAL FRACTURE Right 11/15/2014   Procedure: OPEN REDUCTION INTERNAL  FIXATION (ORIF) RIGHT DISTAL RADIAL FRACTURE WITH ALLOGRAFT BONE GRAFT;  Surgeon: Roseanne Kaufman, MD;  Location: [place];  Service: Orthopedics;  Laterality: Right;   PANCREATECTOMY N/A 02/13/2020   Procedure: LAPAROSCOPIC DISTAL PANCREATECTOMY;  Surgeon: Stark Klein, MD;  Location: [place];  Service: General;  Laterality: N/A;   TEE WITHOUT CARDIOVERSION N/A 10/18/2012   Procedure: TRANSESOPHAGEAL ECHOCARDIOGRAM (TEE);  Surgeon: Lelon Perla, MD;  Location: [facility] ENDOSCOPY;  Service: Cardiovascular;  Laterality: N/A;  Pre-Ablation at 12pm   TONSILLECTOMY     VAGINAL HYSTERECTOMY     Family History   Problem Relation Age of Onset   Multiple sclerosis Father    Breast cancer Neg Hx     Allergies: Darvon, Propoxyphene, Propoxyphene n-acetaminophen, and Levofloxacin Current Outpatient Medications on File Prior to Visit  Medication Sig Dispense Refill   acetaminophen (TYLENOL) 500 MG tablet Take 1,000 mg by mouth every 6 (six) hours as needed for mild pain or moderate pain.     amphetamine-dextroamphetamine (ADDERALL XR) 10 MG 24 hr capsule Take 1 capsule (10 mg total) by mouth daily. 90 capsule 0   apixaban (ELIQUIS) 5 MG TABS tablet Take 1 tablet (5 mg total) by mouth in the morning and at bedtime. 60 tablet 0   blood glucose meter kit and supplies KIT Dispense based on patient and insurance preference. Use up to four times daily as directed. (FOR ICD-9 250.00, 250.01). 1 each 0   CRANBERRY EXTRACT PO Take 2 tablets by mouth every evening.      denosumab (PROLIA) 60 MG/ML SOLN injection Inject 60 mg into the skin every 6 (six) months.      donepezil (ARICEPT) 10 MG tablet Take 10 mg by mouth daily.     DULoxetine (CYMBALTA) 60 MG capsule TAKE 1 CAPSULE BY MOUTH 2 TIMES DAILY. 180 capsule 1   flecainide (TAMBOCOR) 50 MG tablet Take 100 mg by mouth 2 (two) times daily.     gabapentin (NEURONTIN) 100 MG capsule Take 2 capsules (200 mg total) by mouth 2 (two) times daily. For pill pack and delivery 360 capsule 3   levothyroxine (SYNTHROID) 25 MCG tablet TAKE 1 TABLET (25 MCG TOTAL) BY MOUTH DAILY BEFORE BREAKFAST. 30 MINUTES BEFORE FOOD 90 tablet 3   LORazepam (ATIVAN) 0.5 MG tablet TAKE 1 TABLET (0.5 MG TOTAL) BY MOUTH EVERY 8 (EIGHT) HOURS. 90 tablet 5   melatonin 5 MG TABS Take 2.5-5 mg by mouth at bedtime as needed (sleep).     Multiple Vitamin (MULTIVITAMIN WITH MINERALS) TABS tablet Take 1 tablet by mouth daily.     ondansetron (ZOFRAN) 4 MG tablet Take 4 mg by mouth every 8 (eight) hours as needed for nausea.     pravastatin (PRAVACHOL) 10 MG tablet Take 1 tablet (10 mg total) by mouth  at bedtime. Take a whole pill for pill pack and delivery 90 tablet 3   SALT SUBSTITUTES PO Take 1 tablet by mouth daily as needed (hypotension).     timolol (TIMOPTIC) 0.5 % ophthalmic solution Place 1 drop into the left eye at bedtime.      B Complex-C (B-COMPLEX WITH VITAMIN C) tablet Take 1 tablet by mouth daily. (Patient not taking: Reported on 05/12/2021)     Calcium Carb-Cholecalciferol (CALCIUM 600+D3 PO) Take 2 tablets by mouth daily. (Patient not taking: Reported on 05/12/2021)     Cholecalciferol (VITAMIN D3) 50 MCG (2000 UT) TABS Take 4,000 Units by mouth daily. (Patient not taking: Reported on 05/12/2021)  vitamin E 400 UNIT capsule Take 400 Units by mouth daily.   (Patient not taking: Reported on 05/12/2021)     No current facility-administered medications on file prior to visit.    Social History   Tobacco Use   Smoking status: Never   Smokeless tobacco: Never  Vaping Use   Vaping Use: Never used  Substance Use Topics   Alcohol use: Yes    Alcohol/week: 2.0 standard drinks    Types: 2 Standard drinks or equivalent per week    Comment: regular   Drug use: No    Review of Systems  Constitutional:  Negative for chills and fever.  Respiratory:  Negative for cough.   Cardiovascular:  Negative for chest pain and palpitations.  Gastrointestinal:  Negative for nausea and vomiting.  Neurological:  Positive for dizziness (occassional). Negative for syncope.     Objective:    BP 92/60 (BP Location: Left Arm, Patient Position: Sitting, Cuff Size: Normal)   Pulse (!) 54   Temp 98.4 F (36.9 C) (Oral)   Ht 5' 9"  (1.753 m)   Wt 169 lb (76.7 kg)   SpO2 96%   BMI 24.96 kg/m  BP Readings from Last 3 Encounters:  05/12/21 92/60  02/25/21 100/70  02/20/21 (!) 98/58   Wt Readings from Last 3 Encounters:  05/12/21 169 lb (76.7 kg)  02/25/21 172 lb (78 kg)  02/20/21 171 lb (77.6 kg)    Physical Exam Vitals reviewed.  Constitutional:      Appearance: She is  well-developed.  Eyes:     Conjunctiva/sclera: Conjunctivae normal.  Cardiovascular:     Rate and Rhythm: Normal rate and regular rhythm.     Pulses: Normal pulses.     Heart sounds: Normal heart sounds.  Pulmonary:     Effort: Pulmonary effort is normal.     Breath sounds: Normal breath sounds. No wheezing, rhonchi or rales.  Skin:    General: Skin is warm and dry.  Neurological:     Mental Status: She is alert.  Psychiatric:        Speech: Speech normal.        Behavior: Behavior normal.        Thought Content: Thought content normal.       Assessment & Plan:   Problem List Items Addressed This Visit       Cardiovascular and Mediastinum   Orthostatic hypotension    Chronic stable. Asymptomatic today. Counseled on importance of continued daily salt tablet and how to slowly go from sitting to standing. She will continue to monitor BP and let us know of any concerns.         Digestive   Cystic mass of pancreas    Chronic , stable. Discussed with patient CT ab/p 04/17/21 which was faxed to office and reason she was brought for follow up. I have sent staff message to Dr Barry Dienes to ensure the patient is aware of surveillance , follow-up , specifically enhanced MRI. Patient is not scheduled with surgery for follow up at this time.  Advised patient that I would certainly defer further surveillance, imaging to Dr Barry Dienes.        Musculoskeletal and Integument   Onychomycosis - Primary    Uncontrolled. Affected all nailbed left foot. Referral to podiatry.      Relevant Orders   Ambulatory referral to Podiatry     I am having Monica Whitaker maintain her vitamin E, acetaminophen, timolol, denosumab, SALT SUBSTITUTES PO, CRANBERRY EXTRACT  PO, multivitamin with minerals, Calcium Carb-Cholecalciferol (CALCIUM 600+D3 PO), B-complex with vitamin C, Vitamin D3, melatonin, blood glucose meter kit and supplies, LORazepam, pravastatin, gabapentin, Eliquis, flecainide, ondansetron,  DULoxetine, levothyroxine, amphetamine-dextroamphetamine, and donepezil.   No orders of the defined types were placed in this encounter.   Return precautions given.   Risks, benefits, and alternatives of the medications and treatment plan prescribed today were discussed, and patient expressed understanding.   Education regarding symptom management and diagnosis given to patient on AVS.  Continue to follow with McLean-Scocuzza, Nino Glow, MD for routine health maintenance.   Monica Whitaker and I agreed with plan.   Mable Paris, FNP

## 2021-05-08 ENCOUNTER — Telehealth: Payer: Self-pay

## 2021-05-08 NOTE — Telephone Encounter (Signed)
Placed call to [facility] to discuss medication change. LMTCB

## 2021-05-09 ENCOUNTER — Telehealth: Payer: Self-pay

## 2021-05-09 NOTE — Telephone Encounter (Signed)
Confirmed signed and faxed Physician Communication sheet to [facility]. Form has been sent to scan.

## 2021-05-12 ENCOUNTER — Other Ambulatory Visit: Payer: Self-pay

## 2021-05-12 ENCOUNTER — Encounter: Payer: Self-pay | Admitting: Family

## 2021-05-12 ENCOUNTER — Ambulatory Visit (INDEPENDENT_AMBULATORY_CARE_PROVIDER_SITE_OTHER): Payer: PPO | Admitting: Family

## 2021-05-12 VITALS — BP 92/60 | HR 54 | Temp 98.4°F | Ht 69.0 in | Wt 169.0 lb

## 2021-05-12 DIAGNOSIS — B351 Tinea unguium: Secondary | ICD-10-CM | POA: Diagnosis not present

## 2021-05-12 DIAGNOSIS — K862 Cyst of pancreas: Secondary | ICD-10-CM | POA: Diagnosis not present

## 2021-05-12 DIAGNOSIS — I951 Orthostatic hypotension: Secondary | ICD-10-CM

## 2021-05-12 NOTE — Assessment & Plan Note (Addendum)
Chronic , stable. Discussed with patient CT ab/p 04/17/21 which was faxed to office and reason she was brought for follow up. I have sent staff message to Dr Barry Dienes to ensure the patient is aware of surveillance , follow-up , specifically enhanced MRI. Patient is not scheduled with surgery for follow up at this time.  Advised patient that I would certainly defer further surveillance, imaging to Dr Barry Dienes.

## 2021-05-12 NOTE — Assessment & Plan Note (Signed)
Chronic stable. Asymptomatic today. Counseled on importance of continued daily salt tablet and how to slowly go from sitting to standing. She will continue to monitor BP and let us know of any concerns.

## 2021-05-12 NOTE — Assessment & Plan Note (Signed)
Uncontrolled. Affected all nailbed left foot. Referral to podiatry.

## 2021-05-12 NOTE — Patient Instructions (Signed)
I have placed referral to podiatry. Denies exertional chest pain or pressure, numbness or tingling radiating to left arm or jaw, palpitations, dizziness, frequent headaches, changes in vision, or shortness of breath.   Please continue salt tablets daily as you are doing.  Please monitor your blood pressure and certainly if you begin to feel lightheaded or dizzy please call and let us know.  [place] water as discussed as well

## 2021-05-13 ENCOUNTER — Telehealth: Payer: Self-pay | Admitting: Internal Medicine

## 2021-05-13 NOTE — Telephone Encounter (Signed)
Patient saw Arnett on 05/12/21. Patient wanted to speak to Arnett about her appointment yesterday. She has some questions.. Please call her at [phone].

## 2021-05-14 NOTE — Telephone Encounter (Signed)
I spoke with patient yesterday & she was unsure as to why she dd not receive her AVS as well as notes she had made on her paperwork I explained to patient that the only notes we has were the one sticky note that she had asking for podiatry referral. All other paperwork was for [place] to go over with patient for review & the white envelope was from [facility]. Pt stated that she was just trying to figure everything out & that she was more recently placed in memory care at [facility]. I tild her that I could mail her AVS to her., so she could see what her & Joycelyn Schmid talked about.   After hanging up with patient I called & spoke with nurse Janette in memory care at [facility] she was okay with my sending patient's AVS to her. I wanted to make sure given her condition. Madlyn Frankel stated this was fine & gave me fax to number to fax to her, so that she could give to patient. I have faxed.

## 2021-05-19 DIAGNOSIS — H40052 Ocular hypertension, left eye: Secondary | ICD-10-CM | POA: Diagnosis not present

## 2021-05-21 ENCOUNTER — Telehealth: Payer: Self-pay | Admitting: Internal Medicine

## 2021-05-21 NOTE — Telephone Encounter (Signed)
Monica Hawthorne, FNP  McLean-Scocuzza, Nino Glow, MD FYI  I have sent staff message to Dr Barry Dienes to ensure the patient is aware of surveillance , follow-up , specifically enhanced MRI after recent CT ab/p 04/17/21. Patient denies getting any call etc from Dr Barry Dienes as it relates to image.   Patient is not scheduled with surgery for follow up at this time.  I still havent heard back from Dr Barry Dienes. Do you think I should arrange follow up with dr Barry Dienes?    Hi Dr. Barry Dienes pt does not to f/u with you or would you prefer GI?

## 2021-05-21 NOTE — Progress Notes (Signed)
Hi  You are correct  The patient needs to follow up with you for surveillance or would you prefer GI?   Thank you

## 2021-05-21 NOTE — Telephone Encounter (Signed)
Patient is calling in to see how she can set up her mammogram.Please call her at [phone].

## 2021-05-22 NOTE — Telephone Encounter (Signed)
Called to give Monica Whitaker the number to [facility] to schedule a mammogram. The number is [phone]. Left a message to call back.

## 2021-05-22 NOTE — Telephone Encounter (Signed)
Patient given contact information below.She will call [facility] to schedule an appointment.

## 2021-05-27 ENCOUNTER — Other Ambulatory Visit: Payer: Self-pay

## 2021-05-27 ENCOUNTER — Ambulatory Visit (INDEPENDENT_AMBULATORY_CARE_PROVIDER_SITE_OTHER): Payer: PPO | Admitting: Podiatry

## 2021-05-27 ENCOUNTER — Encounter: Payer: Self-pay | Admitting: Podiatry

## 2021-05-27 DIAGNOSIS — B351 Tinea unguium: Secondary | ICD-10-CM | POA: Diagnosis not present

## 2021-05-27 DIAGNOSIS — M79674 Pain in right toe(s): Secondary | ICD-10-CM | POA: Diagnosis not present

## 2021-05-27 DIAGNOSIS — M79675 Pain in left toe(s): Secondary | ICD-10-CM

## 2021-05-27 NOTE — Progress Notes (Signed)
  Subjective:  Patient ID: Monica Whitaker, female    DOB: [birth year],  MRN: [id number]  Chief Complaint  Patient presents with   Nail Problem   Diabetes    "My left big toe hurts."   75 y.o. female returns for the above complaint.  Patient presents with thickened elongated dystrophic toenails x10.  Mild pain on palpation.  Patient would like to have them debrided down.  She has not seen and was prior to see me.  She denies any other acute complaints  Objective:  There were no vitals filed for this visit. Podiatric Exam: Vascular: dorsalis pedis and posterior tibial pulses are palpable bilateral. Capillary return is immediate. Temperature gradient is WNL. Skin turgor WNL  Sensorium: Normal Semmes Weinstein monofilament test. Normal tactile sensation bilaterally. Nail Exam: Pt has thick disfigured discolored nails with subungual debris noted bilateral entire nail hallux through fifth toenails.  Pain on palpation to the nails. Ulcer Exam: There is no evidence of ulcer or pre-ulcerative changes or infection. Orthopedic Exam: Muscle tone and strength are WNL. No limitations in general ROM. No crepitus or effusions noted.  Skin: No Porokeratosis. No infection or ulcers    Assessment & Plan:   1. Pain due to onychomycosis of toenails of both feet     Patient was evaluated and treated and all questions answered.  Onychomycosis with pain  -Nails palliatively debrided as below. -Educated on self-care  Procedure: Nail Debridement Rationale: pain  Type of Debridement: manual, sharp debridement. Instrumentation: Nail nipper, rotary burr. Number of Nails: 10  Procedures and Treatment: Consent by patient was obtained for treatment procedures. The patient understood the discussion of treatment and procedures well. All questions were answered thoroughly reviewed. Debridement of mycotic and hypertrophic toenails, 1 through 5 bilateral and clearing of subungual debris. No ulceration, no  infection noted.  Return Visit-Office Procedure: Patient instructed to return to the office for a follow up visit 3 months for continued evaluation and treatment.  Boneta Lucks, DPM    No follow-ups on file.

## 2021-06-05 ENCOUNTER — Telehealth: Payer: Self-pay | Admitting: Internal Medicine

## 2021-06-05 NOTE — Telephone Encounter (Signed)
Ok to fax order for PT to [facility] call and get phone # for pt and fax or pt can come pick up Rx for PT in black [place]

## 2021-06-05 NOTE — Telephone Encounter (Signed)
Okay for physical therapy referral or does Patient need an appointment?

## 2021-06-05 NOTE — Telephone Encounter (Signed)
Pt called in requesting a referral for gate training. Pt request it be sent to [facility].  Callback number is [phone]

## 2021-06-06 NOTE — Telephone Encounter (Signed)
Left message to return call. Faxed to [facility], calling to inform Patient this was sent over

## 2021-06-09 NOTE — Telephone Encounter (Signed)
Patient calling back in

## 2021-06-09 NOTE — Telephone Encounter (Signed)
Noted  

## 2021-06-09 NOTE — Telephone Encounter (Signed)
Left message to return call.  Okay to inform Patient that her physical therapy referral has been placed if calling back in.

## 2021-06-09 NOTE — Telephone Encounter (Signed)
Pt return call back. Advise Pt of note. Pt want to thank you so much

## 2021-06-10 ENCOUNTER — Ambulatory Visit: Payer: PPO | Admitting: Psychiatry

## 2021-06-11 NOTE — Telephone Encounter (Signed)
Noted In message from Dr Rosalva Ferron, MD  McLean-Scocuzza, Nino Glow, MD; Burnard Hawthorne, FNP So sorry, I have been out of town and then sick.  I thought my assistant called her about lack of change in exam and that we would follow up with an MRI.  I can't remember why we didn't get MRI this time.  But I would plan annual MRI.  Tx  FB

## 2021-06-13 DIAGNOSIS — R2689 Other abnormalities of gait and mobility: Secondary | ICD-10-CM | POA: Diagnosis not present

## 2021-06-13 DIAGNOSIS — I482 Chronic atrial fibrillation, unspecified: Secondary | ICD-10-CM | POA: Diagnosis not present

## 2021-06-13 DIAGNOSIS — R278 Other lack of coordination: Secondary | ICD-10-CM | POA: Diagnosis not present

## 2021-06-13 DIAGNOSIS — M6281 Muscle weakness (generalized): Secondary | ICD-10-CM | POA: Diagnosis not present

## 2021-06-13 DIAGNOSIS — G3184 Mild cognitive impairment, so stated: Secondary | ICD-10-CM | POA: Diagnosis not present

## 2021-06-24 DIAGNOSIS — Z90410 Acquired total absence of pancreas: Secondary | ICD-10-CM | POA: Diagnosis not present

## 2021-06-24 DIAGNOSIS — I4891 Unspecified atrial fibrillation: Secondary | ICD-10-CM | POA: Diagnosis not present

## 2021-06-24 DIAGNOSIS — Z8507 Personal history of malignant neoplasm of pancreas: Secondary | ICD-10-CM | POA: Diagnosis not present

## 2021-06-24 DIAGNOSIS — F330 Major depressive disorder, recurrent, mild: Secondary | ICD-10-CM | POA: Diagnosis not present

## 2021-06-24 DIAGNOSIS — F0390 Unspecified dementia without behavioral disturbance: Secondary | ICD-10-CM | POA: Diagnosis not present

## 2021-06-24 DIAGNOSIS — D6869 Other thrombophilia: Secondary | ICD-10-CM | POA: Diagnosis not present

## 2021-06-24 DIAGNOSIS — Z7901 Long term (current) use of anticoagulants: Secondary | ICD-10-CM | POA: Diagnosis not present

## 2021-06-25 ENCOUNTER — Ambulatory Visit: Payer: PPO | Admitting: Psychiatry

## 2021-07-02 DIAGNOSIS — Z8673 Personal history of transient ischemic attack (TIA), and cerebral infarction without residual deficits: Secondary | ICD-10-CM | POA: Diagnosis not present

## 2021-07-02 DIAGNOSIS — R413 Other amnesia: Secondary | ICD-10-CM | POA: Diagnosis not present

## 2021-07-02 DIAGNOSIS — R251 Tremor, unspecified: Secondary | ICD-10-CM | POA: Diagnosis not present

## 2021-07-03 DIAGNOSIS — I482 Chronic atrial fibrillation, unspecified: Secondary | ICD-10-CM | POA: Diagnosis not present

## 2021-07-03 DIAGNOSIS — G3184 Mild cognitive impairment, so stated: Secondary | ICD-10-CM | POA: Diagnosis not present

## 2021-07-03 DIAGNOSIS — R278 Other lack of coordination: Secondary | ICD-10-CM | POA: Diagnosis not present

## 2021-07-03 DIAGNOSIS — R2689 Other abnormalities of gait and mobility: Secondary | ICD-10-CM | POA: Diagnosis not present

## 2021-07-03 DIAGNOSIS — M6281 Muscle weakness (generalized): Secondary | ICD-10-CM | POA: Diagnosis not present

## 2021-07-09 DIAGNOSIS — N390 Urinary tract infection, site not specified: Secondary | ICD-10-CM | POA: Diagnosis not present

## 2021-07-09 DIAGNOSIS — I959 Hypotension, unspecified: Secondary | ICD-10-CM | POA: Diagnosis not present

## 2021-07-10 DIAGNOSIS — I959 Hypotension, unspecified: Secondary | ICD-10-CM | POA: Diagnosis not present

## 2021-07-15 ENCOUNTER — Ambulatory Visit: Payer: PPO | Admitting: Family

## 2021-07-18 DIAGNOSIS — G3184 Mild cognitive impairment, so stated: Secondary | ICD-10-CM

## 2021-07-18 DIAGNOSIS — F39 Unspecified mood [affective] disorder: Secondary | ICD-10-CM

## 2021-07-18 DIAGNOSIS — F901 Attention-deficit hyperactivity disorder, predominantly hyperactive type: Secondary | ICD-10-CM

## 2021-07-18 DIAGNOSIS — I4891 Unspecified atrial fibrillation: Secondary | ICD-10-CM | POA: Diagnosis not present

## 2021-07-18 DIAGNOSIS — G909 Disorder of the autonomic nervous system, unspecified: Secondary | ICD-10-CM

## 2021-07-18 DIAGNOSIS — E039 Hypothyroidism, unspecified: Secondary | ICD-10-CM | POA: Diagnosis not present

## 2021-07-18 DIAGNOSIS — G479 Sleep disorder, unspecified: Secondary | ICD-10-CM

## 2021-08-04 ENCOUNTER — Other Ambulatory Visit: Payer: Self-pay

## 2021-08-04 DIAGNOSIS — F908 Attention-deficit hyperactivity disorder, other type: Secondary | ICD-10-CM

## 2021-08-05 ENCOUNTER — Other Ambulatory Visit: Payer: Self-pay | Admitting: Psychiatry

## 2021-08-05 DIAGNOSIS — F908 Attention-deficit hyperactivity disorder, other type: Secondary | ICD-10-CM

## 2021-08-05 MED ORDER — AMPHETAMINE-DEXTROAMPHET ER 10 MG PO CP24: 10.0000 mg | ORAL_CAPSULE | Freq: Every day | ORAL | 0 refills | Status: DC

## 2021-08-05 NOTE — Telephone Encounter (Signed)
Nursing home called and said that she needs a refill on her adderall

## 2021-08-06 DIAGNOSIS — R278 Other lack of coordination: Secondary | ICD-10-CM | POA: Diagnosis not present

## 2021-08-06 DIAGNOSIS — R2689 Other abnormalities of gait and mobility: Secondary | ICD-10-CM | POA: Diagnosis not present

## 2021-08-06 DIAGNOSIS — I482 Chronic atrial fibrillation, unspecified: Secondary | ICD-10-CM | POA: Diagnosis not present

## 2021-08-06 DIAGNOSIS — M6281 Muscle weakness (generalized): Secondary | ICD-10-CM | POA: Diagnosis not present

## 2021-08-06 DIAGNOSIS — G3184 Mild cognitive impairment, so stated: Secondary | ICD-10-CM | POA: Diagnosis not present

## 2021-08-07 NOTE — Telephone Encounter (Signed)
This has been taken care of.

## 2021-08-08 DIAGNOSIS — E039 Hypothyroidism, unspecified: Secondary | ICD-10-CM | POA: Diagnosis not present

## 2021-08-08 DIAGNOSIS — F39 Unspecified mood [affective] disorder: Secondary | ICD-10-CM | POA: Diagnosis not present

## 2021-08-08 DIAGNOSIS — G3184 Mild cognitive impairment, so stated: Secondary | ICD-10-CM

## 2021-08-08 DIAGNOSIS — G9009 Other idiopathic peripheral autonomic neuropathy: Secondary | ICD-10-CM

## 2021-08-08 DIAGNOSIS — G479 Sleep disorder, unspecified: Secondary | ICD-10-CM | POA: Diagnosis not present

## 2021-08-08 DIAGNOSIS — I4891 Unspecified atrial fibrillation: Secondary | ICD-10-CM | POA: Diagnosis not present

## 2021-08-08 DIAGNOSIS — F909 Attention-deficit hyperactivity disorder, unspecified type: Secondary | ICD-10-CM

## 2021-08-15 ENCOUNTER — Ambulatory Visit: Payer: PPO

## 2021-08-18 ENCOUNTER — Other Ambulatory Visit: Payer: Self-pay | Admitting: Internal Medicine

## 2021-08-18 ENCOUNTER — Ambulatory Visit
Admission: RE | Admit: 2021-08-18 | Discharge: 2021-08-18 | Disposition: A | Discharge status: Home / Self Care | Payer: PPO | Source: Ambulatory Visit | Attending: Internal Medicine | Admitting: Internal Medicine

## 2021-08-18 ENCOUNTER — Other Ambulatory Visit: Payer: Self-pay

## 2021-08-18 DIAGNOSIS — Z780 Asymptomatic menopausal state: Secondary | ICD-10-CM | POA: Diagnosis not present

## 2021-08-18 DIAGNOSIS — Z1231 Encounter for screening mammogram for malignant neoplasm of breast: Secondary | ICD-10-CM | POA: Insufficient documentation

## 2021-08-18 DIAGNOSIS — M810 Age-related osteoporosis without current pathological fracture: Secondary | ICD-10-CM

## 2021-08-18 DIAGNOSIS — M85852 Other specified disorders of bone density and structure, left thigh: Secondary | ICD-10-CM | POA: Diagnosis not present

## 2021-08-20 ENCOUNTER — Other Ambulatory Visit: Payer: Self-pay

## 2021-08-20 ENCOUNTER — Encounter: Payer: Self-pay | Admitting: Psychiatry

## 2021-08-20 ENCOUNTER — Ambulatory Visit (INDEPENDENT_AMBULATORY_CARE_PROVIDER_SITE_OTHER): Payer: PPO | Admitting: Psychiatry

## 2021-08-20 ENCOUNTER — Telehealth: Payer: Self-pay

## 2021-08-20 VITALS — BP 121/72 | HR 66

## 2021-08-20 DIAGNOSIS — G3184 Mild cognitive impairment, so stated: Secondary | ICD-10-CM | POA: Diagnosis not present

## 2021-08-20 DIAGNOSIS — F908 Attention-deficit hyperactivity disorder, other type: Secondary | ICD-10-CM

## 2021-08-20 DIAGNOSIS — F331 Major depressive disorder, recurrent, moderate: Secondary | ICD-10-CM | POA: Diagnosis not present

## 2021-08-20 DIAGNOSIS — F411 Generalized anxiety disorder: Secondary | ICD-10-CM | POA: Diagnosis not present

## 2021-08-20 NOTE — Telephone Encounter (Signed)
See result note as well. I have called patient to also give her results of DEXA scan as well.

## 2021-08-20 NOTE — Progress Notes (Signed)
Monica Whitaker [id number] April 02, 1946 76 y.o.  Subjective:   Patient ID:  Monica Whitaker is a 76 y.o. (DOB 15-Jun-1946) female.  Chief Complaint:  Chief Complaint  Patient presents with   Follow-up   Depression   ADD   Memory Loss    Monica Whitaker presents today for follow-up of worsening depression and anxiety over care of dying and now deceased husband.    seen July 17, 2018.  No med changes were made at that visit.  Counseling helped a lot and she feels much better at this time.  Much more functional and productive.  More outgoing.  11/2018 appt noted following without med changes: After 5 months depression lifted after [name] died.  No deaths from Covid.   Mood is a lot better.  Sleep is OK.  2 one mile walks daily.  Had party this weekend.    09/09/2020 appointment with following noted: Patient has had a lot of serious medical problems accounting for the long delay since the last appointment.  Hx pancreatic CA by her report.  Afib is better. No Covid.  Travelled.   Mentally pretty decent.  Hard stuck at [facility] for 2 years.  Went to visit friend in [place] and felt much better and so will travel more.  Lonesome bc no good friends. Can't move bc feels tied to [facility] financially. Feels better and cognitively better with stimulant. Plan no med changes  05/05/21 appt noted: [facility] and tedious place.  Doesn't like small room.  Looking for better room there. Does have friends there with whom she can converse. Ups and downs in mood.  Got attention from someone who said he loved her and then he changed his mind. Losing weight intentionally.  Not eating meat.  Eggs are OK.   No SE. No naps. No family around but does have family.  But inherited a good amount of money but being restricted on what she can spend.  Reduced freedom. Plan: Would prefer it be changed to Adderall XR 10 mg AM to extend duration and smooth response and reduce SE risk.  08/20/2021 appt  noted: Switch to Adderall XR 10 mg every morning, continues Aricept 10 mg daily, duloxetine 60 mg twice daily, gabapentin 200 mg twice daily, lorazepam 0.5 mg 3 times daily as needed anxiety. Trouble keeping up with dates.  In memory care now and not crazy about it.  Only person there who can talk.  No conversation, so isolated.  Trying to move to [facility] assisted living.  Has health care [place] [facility] nurse.   Upset house taken apart without her permission and took so much out of her with a sense of loss including antiques from [employer].  Set me back.  But functioning better now.  Financially is fine.   Memory test done end of December 2022 with score of 26/30 which was an improvement of 6 points. No local family or people to help her with the typed application to [facility].  Patient reports stable mood and denies depressed or irritable moods.  Patient denies any recent difficulty with anxiety.  Patient denies difficulty with sleep initiation or maintenance. Denies appetite disturbance.  Patient reports that energy and motivation have been good.  Patient denies any worsening memory or concentration.  Patient denies any suicidal ideation.  Hospice counselor Earnest Bailey was excellent and very helpful.  Past Psychiatric Medication Trials: Pramipexole Duloxetine 120 Adderall or dexedrine. Donepezil 10  Review of Systems:  Review of Systems  Neurological:  Negative for tremors and weakness.  Psychiatric/Behavioral:  Positive for dysphoric mood. Negative for agitation, behavioral problems, confusion, decreased concentration, hallucinations, self-injury, sleep disturbance and suicidal ideas. The patient is nervous/anxious. The patient is not hyperactive.    Medications: I have reviewed the patient's current medications.  Current Outpatient Medications  Medication Sig Dispense Refill   acetaminophen (TYLENOL) 500 MG tablet Take 1,000 mg by mouth every 6 (six) hours as needed for mild  pain or moderate pain.     amphetamine-dextroamphetamine (ADDERALL XR) 10 MG 24 hr capsule Take 1 capsule (10 mg total) by mouth daily. 90 capsule 0   apixaban (ELIQUIS) 5 MG TABS tablet Take 1 tablet (5 mg total) by mouth in the morning and at bedtime. 60 tablet 0   B Complex-C (B-COMPLEX WITH VITAMIN C) tablet Take 1 tablet by mouth daily.     blood glucose meter kit and supplies KIT Dispense based on patient and insurance preference. Use up to four times daily as directed. (FOR ICD-9 250.00, 250.01). 1 each 0   Calcium Carb-Cholecalciferol (CALCIUM 600+D3 PO) Take 2 tablets by mouth daily.     Cholecalciferol (VITAMIN D3) 50 MCG (2000 UT) TABS Take 4,000 Units by mouth daily.     CRANBERRY EXTRACT PO Take 2 tablets by mouth every evening.      denosumab (PROLIA) 60 MG/ML SOLN injection Inject 60 mg into the skin every 6 (six) months.      donepezil (ARICEPT) 10 MG tablet Take 10 mg by mouth daily.     DULoxetine (CYMBALTA) 60 MG capsule TAKE 1 CAPSULE BY MOUTH 2 TIMES DAILY. 180 capsule 1   flecainide (TAMBOCOR) 50 MG tablet Take 100 mg by mouth 2 (two) times daily.     gabapentin (NEURONTIN) 100 MG capsule Take 2 capsules (200 mg total) by mouth 2 (two) times daily. For pill pack and delivery 360 capsule 3   levothyroxine (SYNTHROID) 25 MCG tablet TAKE 1 TABLET (25 MCG TOTAL) BY MOUTH DAILY BEFORE BREAKFAST. 30 MINUTES BEFORE FOOD 90 tablet 3   LORazepam (ATIVAN) 0.5 MG tablet TAKE 1 TABLET (0.5 MG TOTAL) BY MOUTH EVERY 8 (EIGHT) HOURS. 90 tablet 5   melatonin 5 MG TABS Take 2.5-5 mg by mouth at bedtime as needed (sleep).     Multiple Vitamin (MULTIVITAMIN WITH MINERALS) TABS tablet Take 1 tablet by mouth daily.     ondansetron (ZOFRAN) 4 MG tablet Take 4 mg by mouth every 8 (eight) hours as needed for nausea.     pravastatin (PRAVACHOL) 10 MG tablet Take 1 tablet (10 mg total) by mouth at bedtime. Take a whole pill for pill pack and delivery 90 tablet 3   SALT SUBSTITUTES PO Take 1 tablet by  mouth daily as needed (hypotension).     timolol (TIMOPTIC) 0.5 % ophthalmic solution Place 1 drop into the left eye at bedtime.      vitamin E 400 UNIT capsule Take 400 Units by mouth daily.     No current facility-administered medications for this visit.    Medication Side Effects: None  Allergies:  Allergies  Allergen Reactions   Darvon Other (See Comments)    Severe panic attacks   Propoxyphene Other (See Comments)    GI Upset Severe panic attacks GI Upset   Propoxyphene N-Acetaminophen Other (See Comments)    Severe panic attacks   Levofloxacin Anxiety and Other (See Comments)    Causes panic attacks.  Past Medical History:  Diagnosis Date   Actinic keratosis 12/20/2012   left nasal tip   Anxiety    controlled with meds   Anxiety    Arrhythmia    Atrial fibrillation ([name])    paroxysmal, failed medical therapy with flecainide,  NSVT with tikosyn   Basal cell carcinoma 12/20/2012   left proximal mandible   Bruises easily    CVA (cerebral vascular accident) ([place]) 12/2007   Depression    medically controlled    Glaucoma    also with macular holes AE Dr. Thomasene Ripple   History of loop recorder    Hypothyroidism    Low blood pressure    no falls, but if gets up to fast   RLS (restless legs syndrome)    Stroke ([place]) 7 years ago   Lasting effects on balance, different personality.   Thyroid disease    Tremor    Ventricular tachycardia    pt told at [facility] that she had RVOT VT    Family History  Problem Relation Age of Onset   Multiple sclerosis Father    Breast cancer Neg Hx     Social History   Socioeconomic History   Marital status: Widowed    Spouse name: Not on file   Number of children: 0   Years of education: Not on file   Highest education level: Not on file  Occupational History   Occupation: Retired    Fish farm manager: RETIRED  Tobacco Use   Smoking status: Never   Smokeless tobacco: Never  Vaping Use   Vaping Use: Never used  Substance and  Sexual Activity   Alcohol use: Yes    Alcohol/week: 2.0 standard drinks    Types: 2 Standard drinks or equivalent per week    Comment: regular   Drug use: No   Sexual activity: Yes  Other Topics Concern   Not on file  Social History Narrative   Lives in [place] with her husband.  No children 2 step kids.  Former Psychologist, prison and probation services   Enjoys exercise- water classes, yoga Editor, commissioning.    Widowed 2019    Only child and mother was only child   Used to sail with husband      Has a living will-    Would desire CPR   Would not want prolonged life support if futile.      Has 1 cat lives alone no kids but see above    Social Determinants of Health   Financial Resource Strain: Not on file  Food Insecurity: Not on file  Transportation Needs: Not on file  Physical Activity: Not on file  Stress: Not on file  Social Connections: Not on file  Intimate Partner Violence: Not on file    Past Medical History, Surgical history, Social history, and Family history were reviewed and updated as appropriate.   Please see review of systems for further details on the patient's review from today.   Objective:   Physical Exam:  BP 121/72    Pulse 66   Physical Exam Constitutional:      General: She is not in acute distress.    Appearance: She is well-developed.  Musculoskeletal:        General: No deformity.  Neurological:     Mental Status: She is alert and oriented to person, place, and time.     Motor: No tremor.     Coordination: Coordination normal.     Gait: Gait normal.  Psychiatric:  Attention and Perception: She is attentive.        Mood and Affect: Mood is not anxious or depressed. Affect is not labile, blunt, angry, tearful or inappropriate.        Speech: Speech normal.        Behavior: Behavior normal. Behavior is not slowed.        Thought Content: Thought content normal. Thought content is not delusional. Thought content does not include homicidal or suicidal  ideation. Thought content does not include suicidal plan.        Cognition and Memory: She exhibits impaired recent memory.        Judgment: Judgment normal.     Comments: Insight and judgment good.   Pleasant.   No auditory or visual hallucinations. No delusions.    Lab Review:     Component Value Date/Time   NA 138 12/17/2020 0620   NA 139 08/20/2017 1619   NA 136 11/07/2014 1539   K 3.7 12/17/2020 0620   K 4.3 11/07/2014 1539   CL 105 12/17/2020 0620   CL 100 (L) 11/07/2014 1539   CO2 26 12/17/2020 0620   CO2 28 11/07/2014 1539   GLUCOSE 105 (H) 12/17/2020 0620   GLUCOSE 107 (H) 11/07/2014 1539   BUN 22 12/17/2020 0620   BUN 17 08/20/2017 1619   BUN 22 (H) 11/07/2014 1539   CREATININE 0.56 12/17/2020 0620   CREATININE 0.60 11/07/2014 1539   CALCIUM 8.3 (L) 12/17/2020 0620   CALCIUM 9.2 11/07/2014 1539   PROT 6.4 (L) 12/16/2020 1519   ALBUMIN 3.7 12/16/2020 1519   AST 31 12/16/2020 1519   ALT 26 12/16/2020 1519   ALKPHOS 52 12/16/2020 1519   BILITOT 0.7 12/16/2020 1519   GFRNONAA >60 12/17/2020 0620   GFRNONAA >60 11/07/2014 1539   GFRAA >60 02/17/2020 0702   GFRAA >60 11/07/2014 1539       Component Value Date/Time   WBC 5.6 12/17/2020 0620   RBC 3.67 (L) 12/17/2020 0620   HGB 11.1 (L) 12/17/2020 0620   HGB 13.0 08/20/2017 1619   HCT 33.4 (L) 12/17/2020 0620   HCT 38.1 08/20/2017 1619   PLT 222 12/17/2020 0620   PLT 286 08/20/2017 1619   MCV 91.0 12/17/2020 0620   MCV 89 08/20/2017 1619   MCV 92 11/07/2014 1539   MCH 30.2 12/17/2020 0620   MCHC 33.2 12/17/2020 0620   RDW 14.2 12/17/2020 0620   RDW 14.1 08/20/2017 1619   RDW 13.9 11/07/2014 1539   LYMPHSABS 1.7 12/16/2020 1519   MONOABS 0.9 12/16/2020 1519   EOSABS 0.1 12/16/2020 1519   BASOSABS 0.1 12/16/2020 1519    No results found for: POCLITH, LITHIUM   No results found for: PHENYTOIN, PHENOBARB, VALPROATE, CBMZ   .res Assessment: Plan:    Major depressive disorder, recurrent episode,  moderate ([name])  Generalized anxiety disorder  Attention deficit hyperactivity disorder (ADHD), other type  Mild cognitive impairment acquired after stroke.    Greater than 50% of 30 min face to face time with patient was spent on counseling and coordination of care. We discussed the following. Reviewed med list from [facility]. Donepizil was increased.   Monica Silversmith, NP-C added Wellbutrin SR 100 BID for mood.  Her note reviewed from 08/06/2021. Agree with that trial.. Neurology note 07/02/2021 reviewed. MCI DT strokes. Pt does not have dementia and I would support her transition to regular assisted living and out of memory care.    Benefit Dexedrine XR or Adderall  XR for cost.  Wants to continue  it DT benefit.  She's been switched to Adderall 10 mg AM.   Continue Adderall XR 10 mg AM to extend duration and smooth response and reduce SE risk.  Done well with duloxetine 120 mg daily. Continue duloxetine and no med changes.  Forgets occ.  Disc withdrawal.  Supportive therapy on loss of independence and dealing with the desired transition out of memory care and into assisted living.  Apparently memory is improved vs when went into memory care. Asks for letter of rec to get out of memory care and into assisted living..  would be in apartment at [facility].  She would still have help with meds.  FU 3 mos  Lynder Parents, MD, DFAPA   Please see After Visit Summary for patient specific instructions.  Future Appointments  Date Time Provider [place]  09/04/2021  9:00 AM Deboraha Sprang, MD [id number]    No orders of the defined types were placed in this encounter.      -------------------------------

## 2021-08-20 NOTE — Telephone Encounter (Addendum)
Pt returning call and she said she is leaving the practice because she found another provider

## 2021-08-20 NOTE — Telephone Encounter (Signed)
I called patient to let her know results of DEXA & patient did not answer no VM.

## 2021-09-04 ENCOUNTER — Ambulatory Visit: Payer: PPO | Admitting: Internal Medicine

## 2021-09-04 DIAGNOSIS — E785 Hyperlipidemia, unspecified: Secondary | ICD-10-CM | POA: Diagnosis not present

## 2021-09-04 DIAGNOSIS — E039 Hypothyroidism, unspecified: Secondary | ICD-10-CM | POA: Diagnosis not present

## 2021-09-04 NOTE — Progress Notes (Signed)
3 attempts were made by phone to give the patient her bone density results with no success, today a letter was mailed to the patients home to call the office for theses results.  Saber Dickerman,cma

## 2021-09-05 DIAGNOSIS — R278 Other lack of coordination: Secondary | ICD-10-CM | POA: Diagnosis not present

## 2021-09-05 DIAGNOSIS — M6281 Muscle weakness (generalized): Secondary | ICD-10-CM | POA: Diagnosis not present

## 2021-09-05 DIAGNOSIS — R2689 Other abnormalities of gait and mobility: Secondary | ICD-10-CM | POA: Diagnosis not present

## 2021-09-05 DIAGNOSIS — I482 Chronic atrial fibrillation, unspecified: Secondary | ICD-10-CM | POA: Diagnosis not present

## 2021-09-05 DIAGNOSIS — G3184 Mild cognitive impairment, so stated: Secondary | ICD-10-CM | POA: Diagnosis not present

## 2021-09-18 DIAGNOSIS — I4891 Unspecified atrial fibrillation: Secondary | ICD-10-CM | POA: Diagnosis not present

## 2021-09-18 DIAGNOSIS — Z7901 Long term (current) use of anticoagulants: Secondary | ICD-10-CM | POA: Diagnosis not present

## 2021-09-18 DIAGNOSIS — D6869 Other thrombophilia: Secondary | ICD-10-CM | POA: Diagnosis not present

## 2021-09-18 DIAGNOSIS — F330 Major depressive disorder, recurrent, mild: Secondary | ICD-10-CM | POA: Diagnosis not present

## 2021-09-18 DIAGNOSIS — I69351 Hemiplegia and hemiparesis following cerebral infarction affecting right dominant side: Secondary | ICD-10-CM | POA: Diagnosis not present

## 2021-09-18 DIAGNOSIS — I739 Peripheral vascular disease, unspecified: Secondary | ICD-10-CM | POA: Diagnosis not present

## 2021-09-18 DIAGNOSIS — F0390 Unspecified dementia without behavioral disturbance: Secondary | ICD-10-CM | POA: Diagnosis not present

## 2021-10-01 DIAGNOSIS — R2689 Other abnormalities of gait and mobility: Secondary | ICD-10-CM | POA: Diagnosis not present

## 2021-10-01 DIAGNOSIS — M6281 Muscle weakness (generalized): Secondary | ICD-10-CM | POA: Diagnosis not present

## 2021-10-01 DIAGNOSIS — G3184 Mild cognitive impairment, so stated: Secondary | ICD-10-CM | POA: Diagnosis not present

## 2021-10-01 DIAGNOSIS — I482 Chronic atrial fibrillation, unspecified: Secondary | ICD-10-CM | POA: Diagnosis not present

## 2021-10-01 DIAGNOSIS — R278 Other lack of coordination: Secondary | ICD-10-CM | POA: Diagnosis not present

## 2021-10-13 DIAGNOSIS — H1011 Acute atopic conjunctivitis, right eye: Secondary | ICD-10-CM | POA: Diagnosis not present

## 2021-10-16 ENCOUNTER — Encounter: Payer: Self-pay | Admitting: Internal Medicine

## 2021-10-16 ENCOUNTER — Other Ambulatory Visit: Payer: Self-pay

## 2021-10-16 ENCOUNTER — Ambulatory Visit: Payer: PPO | Admitting: Internal Medicine

## 2021-10-16 VITALS — BP 102/70 | HR 64 | Ht 69.0 in | Wt 165.0 lb

## 2021-10-16 DIAGNOSIS — I480 Paroxysmal atrial fibrillation: Secondary | ICD-10-CM

## 2021-10-16 NOTE — Progress Notes (Signed)
? ? ? ? ?Patient Care Team: ?Jearld Fenton, NP as PCP - General (Internal Medicine) ?Sherran Needs, NP as Nurse Practitioner (Nurse Practitioner) ?Thompson Grayer, MD as Consulting Physician (Cardiology) ?Roseanne Kaufman, MD as Consulting Physician (Orthopedic Surgery) ?Josefine Class, MD as Referring Physician (Gastroenterology) ?Estill Cotta, MD as Consulting Physician (Ophthalmology) ? ? ?HPI ? ?Monica Whitaker is a 76 y.o. female ?Seen in followup-- longstanding pt of JA, PAF with hx of ablation X 2 2014/2020 managed with flecainide, diltiazem and anticoagulation with Apixoban.  B/c of desire to come off anticoagulation ILR implanted to monitor AF burden ? ?ILR reports reviewed  4/22 >> afib NO--- sinus with PACs  ? ?The patient denies chest pain, shortness of breath, nocturnal dyspnea, orthopnea or peripheral edema.  There have been no palpitations, lightheadedness or syncope.  .  ? ?Progressive dementia >>> memory care but now has been moved back to assisted living. ? ?Her biggest issue is that she is not allowed to travel with the others without a Psychologist, sport and exercise. ? ?  ? ?Note reviewed from psychiatry 10/22 noting depression doing pretty well.  Also "hospice counselor " but she denies that she is on hospice. ? ?Date Cr K Hgb  ?5/22 0.56 3.7 11.1  ?      ? ?DATE TEST EF   ?5/22 Echo   55-60 %   ?      ?     ?     ? ? ? ? ?Thromboembolic risk factors ( age -23, TIA/CVA-2, Gender-1) for a CHADSVASc Score of >=4 ? ? ?Records and Results Reviewed ? ?Past Medical History:  ?Diagnosis Date  ? Actinic keratosis 12/20/2012  ? left nasal tip  ? Anxiety   ? controlled with meds  ? Anxiety   ? Arrhythmia   ? Atrial fibrillation ([place])   ? paroxysmal, failed medical therapy with flecainide,  NSVT with tikosyn  ? Basal cell carcinoma 12/20/2012  ? left proximal mandible  ? Bruises easily   ? CVA (cerebral vascular accident) ([place]) 12/2007  ? Depression   ? medically controlled   ? Glaucoma   ? also with  macular holes AE Dr. Thomasene Ripple  ? History of loop recorder   ? Hypothyroidism   ? Low blood pressure   ? no falls, but if gets up to fast  ? RLS (restless legs syndrome)   ? Stroke [facility]) 7 years ago  ? Lasting effects on balance, different personality.  ? Thyroid disease   ? Tremor   ? Ventricular tachycardia   ? pt told at [facility] that she had RVOT VT  ? ? ?Past Surgical History:  ?Procedure Laterality Date  ? ATRIAL FIBRILLATION ABLATION  10/18/12  ? PVI by Dr Rayann Heman  ? ATRIAL FIBRILLATION ABLATION N/A 10/18/2012  ? Procedure: ATRIAL FIBRILLATION ABLATION;  Surgeon: Thompson Grayer, MD;  [facility] C[facility] LAB;  Service: Cardiovascular;  Laterality: N/A;  ? ATRIAL FIBRILLATION ABLATION N/A 04/18/2019  ? Procedure: ATRIAL FIBRILLATION ABLATION;  Surgeon: Thompson Grayer, MD;  Location: [place] CV LAB;  Service: Cardiovascular;  Laterality: N/A;  ? EUS N/A 10/19/2019  ? Procedure: FULL UPPER ENDOSCOPIC ULTRASOUND (EUS) RADIAL;  Surgeon: Holly Bodily, MD;  Location: [facility] ENDOSCOPY;  Service: Gastroenterology;  Laterality: N/A;  ? EYE SURGERY    ? on L/R eye, macular hole   ? FRACTURE SURGERY    ? implantable loop recorder placement  07/13/2019  ? MDT Reveal [id number] Bethann Goo [id number]) implanted in office  by Dr Rayann Heman for afib management post ablation  ? OPEN REDUCTION INTERNAL FIXATION (ORIF) DISTAL RADIAL FRACTURE Right 11/15/2014  ? Procedure: OPEN REDUCTION INTERNAL FIXATION (ORIF) RIGHT DISTAL RADIAL FRACTURE WITH ALLOGRAFT BONE GRAFT;  Surgeon: Roseanne Kaufman, MD;  Location: [place];  Service: Orthopedics;  Laterality: Right;  ? PANCREATECTOMY N/A 02/13/2020  ? Procedure: LAPAROSCOPIC DISTAL PANCREATECTOMY;  Surgeon: Stark , MD;  Location: [facility];  Service: General;  Laterality: N/A;  ? TEE WITHOUT CARDIOVERSION N/A 10/18/2012  ? Procedure: TRANSESOPHAGEAL ECHOCARDIOGRAM (TEE);  Surgeon: Lelon Perla, MD;  Location: [facility] ENDOSCOPY;  Service: Cardiovascular;  Laterality: N/A;  Pre-Ablation at  12pm  ? TONSILLECTOMY    ? VAGINAL HYSTERECTOMY    ? ? ?Current Meds  ?Medication Sig  ? acetaminophen (TYLENOL) 500 MG tablet Take 1,000 mg by mouth every 6 (six) hours as needed for mild pain or moderate pain.  ? amphetamine-dextroamphetamine (ADDERALL XR) 10 MG 24 hr capsule Take 1 capsule (10 mg total) by mouth daily.  ? apixaban (ELIQUIS) 5 MG TABS tablet Take 1 tablet (5 mg total) by mouth in the morning and at bedtime.  ? B Complex-C (B-COMPLEX WITH VITAMIN C) tablet Take 1 tablet by mouth daily.  ? blood glucose meter kit and supplies KIT Dispense based on patient and insurance preference. Use up to four times daily as directed. (FOR ICD-9 250.00, 250.01).  ? Calcium Carb-Cholecalciferol (CALCIUM 600+D3 PO) Take 2 tablets by mouth daily.  ? Cholecalciferol (VITAMIN D3) 50 MCG (2000 UT) TABS Take 4,000 Units by mouth daily.  ? CRANBERRY EXTRACT PO Take 2 tablets by mouth every evening.   ? denosumab (PROLIA) 60 MG/ML SOLN injection Inject 60 mg into the skin every 6 (six) months.   ? donepezil (ARICEPT) 10 MG tablet Take 10 mg by mouth daily.  ? DULoxetine (CYMBALTA) 60 MG capsule TAKE 1 CAPSULE BY MOUTH 2 TIMES DAILY.  ? flecainide (TAMBOCOR) 50 MG tablet Take 100 mg by mouth 2 (two) times daily.  ? gabapentin (NEURONTIN) 100 MG capsule Take 2 capsules (200 mg total) by mouth 2 (two) times daily. For pill pack and delivery (Patient taking differently: Take 100 mg by mouth 2 (two) times daily. For pill pack and delivery)  ? levothyroxine (SYNTHROID) 25 MCG tablet TAKE 1 TABLET (25 MCG TOTAL) BY MOUTH DAILY BEFORE BREAKFAST. [place]  ? LORazepam (ATIVAN) 0.5 MG tablet TAKE 1 TABLET (0.5 MG TOTAL) BY MOUTH EVERY 8 (EIGHT) HOURS.  ? melatonin 5 MG TABS Take 2.5-5 mg by mouth at bedtime as needed (sleep).  ? Multiple Vitamin (MULTIVITAMIN WITH MINERALS) TABS tablet Take 1 tablet by mouth daily.  ? ondansetron (ZOFRAN) 4 MG tablet Take 4 mg by mouth every 8 (eight) hours as needed for nausea.  ?  pravastatin (PRAVACHOL) 10 MG tablet Take 1 tablet (10 mg total) by mouth at bedtime. Take a whole pill for pill pack and delivery  ? SALT SUBSTITUTES PO Take 1 tablet by mouth daily as needed (hypotension).  ? timolol (TIMOPTIC) 0.5 % ophthalmic solution Place 1 drop into the left eye at bedtime.   ? ? ?Allergies  ?Allergen Reactions  ? Darvon Other (See Comments)  ?  Severe panic attacks  ? Propoxyphene Other (See Comments)  ?  GI Upset ?Severe panic attacks ?GI Upset  ? Propoxyphene N-Acetaminophen Other (See Comments)  ?  Severe panic attacks  ? Levofloxacin Anxiety and Other (See Comments)  ?  Causes panic attacks.  ? ? ? ? ?  Review of Systems negative except from HPI and PMH ? ?Physical Exam ?BP 102/70 (BP Location: Left Arm, Patient Position: Sitting, Cuff Size: Normal)   Pulse 64   Ht _0  (1.753 m)   Wt 165 lb (74.8 kg)   SpO2 92%   BMI 24.37 kg/m?  ?Well developed and nourished in no acute distress ?HENT normal ?Neck supple with  ?Clear ?Regular rate and rhythm, no murmurs or gallops ?Abd-soft with active BS ?No Clubbing cyanosis edema ?Skin-warm and dry ?A & Oriented  Grossly normal sensory and motor function ? ?ECG sinus rhythm at 64 ?Intervals 20/11/43 ? ?CrCl cannot be calculated (Patient's most recent lab result is older than the maximum 21 days allowed.). ? ? ?Assessment and  Plan ? ?Atrial fibrillation s/p ablation x 2 ? ?Dementia progressive ? ?Flecainide ? ?Tolerating flecainide.  Continue 50 twice daily.  No bleeding on the Eliquis, continue 5 mg a day. ? ? ?  ? ? ? ? ? ? ?Current medicines are reviewed at length with the patient today .  The patient does not have concerns regarding medicines. ? ?

## 2021-10-16 NOTE — Patient Instructions (Signed)

## 2021-10-17 ENCOUNTER — Non-Acute Institutional Stay (INDEPENDENT_AMBULATORY_CARE_PROVIDER_SITE_OTHER): Payer: PPO | Admitting: Internal Medicine

## 2021-10-17 ENCOUNTER — Encounter: Payer: Self-pay | Admitting: Internal Medicine

## 2021-10-17 DIAGNOSIS — E039 Hypothyroidism, unspecified: Secondary | ICD-10-CM | POA: Diagnosis not present

## 2021-10-17 DIAGNOSIS — F900 Attention-deficit hyperactivity disorder, predominantly inattentive type: Secondary | ICD-10-CM

## 2021-10-17 DIAGNOSIS — I63412 Cerebral infarction due to embolism of left middle cerebral artery: Secondary | ICD-10-CM | POA: Diagnosis not present

## 2021-10-17 DIAGNOSIS — G250 Essential tremor: Secondary | ICD-10-CM | POA: Diagnosis not present

## 2021-10-17 DIAGNOSIS — F39 Unspecified mood [affective] disorder: Secondary | ICD-10-CM

## 2021-10-17 DIAGNOSIS — E782 Mixed hyperlipidemia: Secondary | ICD-10-CM | POA: Diagnosis not present

## 2021-10-17 DIAGNOSIS — R413 Other amnesia: Secondary | ICD-10-CM | POA: Diagnosis not present

## 2021-10-17 DIAGNOSIS — M8000XP Age-related osteoporosis with current pathological fracture, unspecified site, subsequent encounter for fracture with malunion: Secondary | ICD-10-CM | POA: Diagnosis not present

## 2021-10-17 DIAGNOSIS — I480 Paroxysmal atrial fibrillation: Secondary | ICD-10-CM | POA: Diagnosis not present

## 2021-10-17 MED ORDER — GABAPENTIN 100 MG PO CAPS: 100.0000 mg | ORAL_CAPSULE | Freq: Three times a day (TID) | ORAL | 0 refills | Status: DC

## 2021-10-17 MED ORDER — TRAZODONE HCL 50 MG PO TABS: 25.0000 mg | ORAL_TABLET | Freq: Every evening | ORAL | 3 refills | Status: DC | PRN

## 2021-10-17 NOTE — Assessment & Plan Note (Signed)
Would like to get her off Adderall, but she is resistant at this time ?

## 2021-10-17 NOTE — Assessment & Plan Note (Signed)
I would like to try to get her off Adderall but she is resistant at this time ?Will decrease Gabapentin ?Continue Duloxetine, Wellbutrin and Ativan ?Support offered ?

## 2021-10-17 NOTE — Assessment & Plan Note (Signed)
We will check free T4 yearly ?Continue current dose of Levothyroxine ?

## 2021-10-17 NOTE — Assessment & Plan Note (Signed)
Not on statin ?Continue low fat diet ?

## 2021-10-17 NOTE — Assessment & Plan Note (Signed)
Continue Flecainide and Eliquis ?She will continue to follow with cardiology ?

## 2021-10-17 NOTE — Assessment & Plan Note (Signed)
Continue Donepezil ?Appreciate ALF care ?

## 2021-10-17 NOTE — Assessment & Plan Note (Signed)
Mainly cognitive residual effects ?Continue Eliquis ?Not currently on statin therapy ?

## 2021-10-17 NOTE — Progress Notes (Signed)
? ?Subjective:  ? ? Patient ID: Monica Whitaker, female    DOB: 1946/01/18, 76 y.o.   MRN: [id number] ? ?HPI ? ?Resident seen in [address] ?No new concerns from staff.  Resident continues to have issues with sleep.  She takes 5 mg of melatonin before bed and often gets 5 mg of melatonin in the middle night which she reports is not helping her to stay asleep.  Overall though, she is much happier here in [place].  She is independent with her ADLs.  She walks without a device.  Her appetite is good.  Her weight has been stable.  She denies urinary issues.  Her bowels are moving good.  She denies pain, reflux, chest pain, shortness of breath.  She reports her mood is much better since her move. ? ?MCI: She is taking Aricept as prescribed.  She follows with neurology. ? ?Mood Disorder/ADHD: Mainly anxiety and dysthymia.  She is taking Adderall, Duloxetine, Gabapentin, Wellbutrin and Ativan as prescribed.  She no longer follows with psychiatry. ? ?A-fib: Managed on Flecainide and Eliquis.  She saw Dr. Caryl Comes yesterday.  ECG from 10/2021 reviewed. ? ?Hypothyroidism: She denies any issues on her current dose of Levothyroxine. ? ?HLD with History of CVA: Her last LDL was 96, triglycerides 63, 06/2020 she is taking Pravastatin as prescribed. ? ?Osteoporosis: She is not currently taking any Calcium or Vitamin D OTC.  She is not taking Prolia anymore.  She gets weightbearing exercise and daily. ? ?Anemia: Her last H/H was 11.1/34, 06/2021.  She is not taking any oral iron at this time. ? ?Review of Systems ? ?   ?Past Medical History:  ?Diagnosis Date  ? Actinic keratosis 12/20/2012  ? left nasal tip  ? Anxiety   ? controlled with meds  ? Anxiety   ? Arrhythmia   ? Atrial fibrillation ([place])   ? paroxysmal, failed medical therapy with flecainide,  NSVT with tikosyn  ? Basal cell carcinoma 12/20/2012  ? left proximal mandible  ? Bruises easily   ? CVA (cerebral vascular accident) ([place]) 12/2007  ? Depression   ? medically  controlled   ? Glaucoma   ? also with macular holes AE Dr. Thomasene Ripple  ? History of loop recorder   ? Hypothyroidism   ? Low blood pressure   ? no falls, but if gets up to fast  ? RLS (restless legs syndrome)   ? Stroke [facility]) 7 years ago  ? Lasting effects on balance, different personality.  ? Thyroid disease   ? Tremor   ? Ventricular tachycardia   ? pt told at [facility] that she had RVOT VT  ? ? ?Current Outpatient Medications  ?Medication Sig Dispense Refill  ? acetaminophen (TYLENOL) 500 MG tablet Take 1,000 mg by mouth every 6 (six) hours as needed for mild pain or moderate pain.    ? amphetamine-dextroamphetamine (ADDERALL XR) 10 MG 24 hr capsule Take 1 capsule (10 mg total) by mouth daily. 90 capsule 0  ? apixaban (ELIQUIS) 5 MG TABS tablet Take 1 tablet (5 mg total) by mouth in the morning and at bedtime. 60 tablet 0  ? donepezil (ARICEPT) 10 MG tablet Take 10 mg by mouth daily.    ? DULoxetine (CYMBALTA) 60 MG capsule TAKE 1 CAPSULE BY MOUTH 2 TIMES DAILY. 180 capsule 1  ? flecainide (TAMBOCOR) 50 MG tablet Take 100 mg by mouth 2 (two) times daily.    ? gabapentin (NEURONTIN) 100 MG capsule Take 2 capsules (  200 mg total) by mouth 2 (two) times daily. For pill pack and delivery (Patient taking differently: Take 100 mg by mouth 2 (two) times daily. For pill pack and delivery) 360 capsule 3  ? levothyroxine (SYNTHROID) 25 MCG tablet TAKE 1 TABLET (25 MCG TOTAL) BY MOUTH DAILY BEFORE BREAKFAST. 30 MINUTES BEFORE FOOD 90 tablet 3  ? LORazepam (ATIVAN) 0.5 MG tablet TAKE 1 TABLET (0.5 MG TOTAL) BY MOUTH EVERY 8 (EIGHT) HOURS. 90 tablet 5  ? melatonin 5 MG TABS Take 2.5-5 mg by mouth at bedtime as needed (sleep).    ? Multiple Vitamin (MULTIVITAMIN WITH MINERALS) TABS tablet Take 1 tablet by mouth daily.    ? pravastatin (PRAVACHOL) 10 MG tablet Take 1 tablet (10 mg total) by mouth at bedtime. Take a whole pill for pill pack and delivery 90 tablet 3  ? timolol (TIMOPTIC) 0.5 % ophthalmic solution Place 1 drop into the  left eye at bedtime.     ? ondansetron (ZOFRAN) 4 MG tablet Take 4 mg by mouth every 8 (eight) hours as needed for nausea. (Patient not taking: Reported on 10/17/2021)    ? ?No current facility-administered medications for this visit.  ? ? ?Allergies  ?Allergen Reactions  ? Darvon Other (See Comments)  ?  Severe panic attacks  ? Propoxyphene Other (See Comments)  ?  GI Upset ?Severe panic attacks ?GI Upset  ? Propoxyphene N-Acetaminophen Other (See Comments)  ?  Severe panic attacks  ? Levofloxacin Anxiety and Other (See Comments)  ?  Causes panic attacks.  ? ? ?Family History  ?Problem Relation Age of Onset  ? Multiple sclerosis Father   ? Breast cancer Neg Hx   ? ? ?Social History  ? ?Socioeconomic History  ? Marital status: Widowed  ?  Spouse name: Not on file  ? Number of children: 0  ? Years of education: Not on file  ? Highest education level: Not on file  ?Occupational History  ? Occupation: Retired  ?  Employer: RETIRED  ?Tobacco Use  ? Smoking status: Never  ? Smokeless tobacco: Never  ?Vaping Use  ? Vaping Use: Never used  ?Substance and Sexual Activity  ? Alcohol use: Yes  ?  Alcohol/week: 2.0 standard drinks  ?  Types: 2 Standard drinks or equivalent per week  ?  Comment: regular  ? Drug use: No  ? Sexual activity: Yes  ?Other Topics Concern  ? Not on file  ?Social History Narrative  ? Lives in [place] with her husband.  No children 2 step kids.  Former Psychologist, prison and probation services  ? Enjoys exercise- water classes, yoga strength training.   ? Widowed 2019   ? Only child and mother was only child  ? Used to sail with husband  ?   ? Has a living will-   ? Would desire CPR  ? Would not want prolonged life support if futile.  ?   ? Has 1 cat lives alone no kids but see above   ? ?Social Determinants of Health  ? ?Financial Resource Strain: Not on file  ?Food Insecurity: Not on file  ?Transportation Needs: Not on file  ?Physical Activity: Not on file  ?Stress: Not on file  ?Social Connections: Not on file  ?Intimate  Partner Violence: Not on file  ? ? ? ?Constitutional: Patient reports chronic fatigue.  Denies fever, malaise, headache or abrupt weight changes.  ?HEENT: Denies eye pain, eye redness, ear pain, ringing in the ears, wax buildup, runny nose,  nasal congestion, bloody nose, or sore throat. ?Respiratory: Denies difficulty breathing, shortness of breath, cough or sputum production.   ?Cardiovascular: Denies chest pain, chest tightness, palpitations or swelling in the hands or feet.  ?Gastrointestinal: Denies abdominal pain, bloating, constipation, diarrhea or blood in the stool.  ?GU: Denies urgency, frequency, pain with urination, burning sensation, blood in urine, odor or discharge. ?Musculoskeletal: Denies decrease in range of motion, difficulty with gait, muscle pain or joint pain and swelling.  ?Skin: Denies redness, rashes, lesions or ulcercations.  ?Neurological: Patient reports insomnia.  Denies dizziness, difficulty with memory, difficulty with speech or problems with balance and coordination.  ?Psych: Patient has a history of anxiety and depression.  Denies SI/HI. ? ?No other specific complaints in a complete review of systems (except as listed in HPI above). ? ?Objective:  ? Physical Exam ? ?BP (!) 91/55   Pulse 66   Temp 97.9 ?F (36.6 ?C)   Resp 16   Wt 166 lb 6.4 oz (75.5 kg)   SpO2 95%   BMI 24.57 kg/m?  ?Wt Readings from Last 3 Encounters:  ?10/17/21 166 lb 6.4 oz (75.5 kg)  ?10/16/21 165 lb (74.8 kg)  ?05/12/21 169 lb (76.7 kg)  ? ? ?General: Appears her stated age, well developed, well nourished in NAD. ?Skin: Warm, dry and intact.  ?Neck:  Neck supple, trachea midline. No masses, lumps or thyromegaly present.  ?Cardiovascular: Normal rate and rhythm. S1,S2 noted.  No murmur, rubs or gallops noted. No JVD or BLE edema. ?Pulmonary/Chest: Normal effort and positive vesicular breath sounds. No respiratory distress. No wheezes, rales or ronchi noted.  ?Abdomen: Soft and nontender. Normal bowel sounds.   ?Musculoskeletal: No difficulty with gait.  ?Neurological: Alert and oriented.  Has difficulty with recall.  Repeats herself intermittently. ?Psychiatric: Mood and affect normal. Behavior is normal. Judgment and th

## 2021-10-17 NOTE — Assessment & Plan Note (Signed)
Encouraged daily weight bearing exercise 

## 2021-10-18 ENCOUNTER — Other Ambulatory Visit: Payer: Self-pay

## 2021-10-18 ENCOUNTER — Emergency Department
Admission: EM | Admit: 2021-10-18 | Discharge: 2021-10-18 | Disposition: A | Discharge status: Home / Self Care | Payer: PPO | Attending: Emergency Medicine | Admitting: Emergency Medicine

## 2021-10-18 DIAGNOSIS — H5712 Ocular pain, left eye: Secondary | ICD-10-CM | POA: Diagnosis not present

## 2021-10-18 DIAGNOSIS — H5710 Ocular pain, unspecified eye: Secondary | ICD-10-CM | POA: Diagnosis not present

## 2021-10-18 DIAGNOSIS — I10 Essential (primary) hypertension: Secondary | ICD-10-CM | POA: Insufficient documentation

## 2021-10-18 DIAGNOSIS — R5381 Other malaise: Secondary | ICD-10-CM | POA: Diagnosis not present

## 2021-10-18 DIAGNOSIS — R0902 Hypoxemia: Secondary | ICD-10-CM | POA: Diagnosis not present

## 2021-10-18 DIAGNOSIS — R52 Pain, unspecified: Secondary | ICD-10-CM | POA: Diagnosis not present

## 2021-10-18 MED ORDER — TETRACAINE HCL 0.5 % OP SOLN
1.0000 [drp] | Freq: Once | OPHTHALMIC | Status: DC
  Filled 2021-10-18: qty 4

## 2021-10-18 MED ORDER — FLUORESCEIN SODIUM 1 MG OP STRP
1.0000 | ORAL_STRIP | Freq: Once | OPHTHALMIC | Status: DC
  Filled 2021-10-18: qty 1

## 2021-10-18 NOTE — ED Notes (Signed)
Pt attempting to call ride ?

## 2021-10-18 NOTE — ED Notes (Signed)
Tetracaine and fluorescein strip given to EDP at this time ?

## 2021-10-18 NOTE — ED Notes (Signed)
Pt's discharge delayed due to pt needing transport home.  ?

## 2021-10-18 NOTE — ED Notes (Signed)
Pt called facility for ride- states they are trying to see if security could pick up pt but they cannot at this time. Pt calling friend to see if the husband could pick her up. Cabs not running at this time.  ?

## 2021-10-18 NOTE — Discharge Instructions (Addendum)
-  Follow-up with your regular ophthalmologist within the next 2 to 3 days, as discussed. ?-Return to the emergency department anytime if you begin to experience any new or worsening symptoms. ?

## 2021-10-18 NOTE — ED Triage Notes (Signed)
Patient to ER via [facility] from [facility] with complaints of left eye pain, patient states she woke up with pain in eye. Had surgery around 10 years ago for a macular hole. Paperwork from surgery stated that if she were to have pain again to come to the ER immediately.  ? ? ?A+ox4, ambulatory  ? ?74 HR, 97% RA, 111/66  ?

## 2021-10-18 NOTE — ED Provider Notes (Signed)
? ?[facility] ?Provider Note ? ? ? Event Date/Time  ? First MD Initiated Contact with Patient 10/18/21 1631   ?  (approximate) ? ? ?History  ? ?Chief Complaint ?No chief complaint on file. ? ? ?HPI ?Monica Whitaker is a 76 y.o. female, hx of ocular hypertension, atrial fibrillation, mood disorder, hyperlipidemia, ADHD, severe, presents to the emergency department for evaluation of left-sided eye pain.  Patient states that she woke up this morning with pain in her left eye.  She states that she was experiencing discomfort in her left eye while watching TV.  She states that she had surgery around 10 years ago for macular hole.  Denies fever/chills, floaters, visual field deficits, headache, hearing changes, neck pain, chest pain, shortness of breath, abdominal pain, nausea/vomiting, or rashes/lesions. ? ?History Limitations: No limitations. ? ?  ? ? ?Physical Exam  ?Triage Vital Signs: ?ED Triage Vitals [10/18/21 1627]  ?Enc Vitals Group  ?   BP 127/76  ?   Pulse Rate 76  ?   Resp 16  ?   Temp 98.3 ?F (36.8 ?C)  ?   Temp Source Oral  ?   SpO2 100 %  ?   Weight 165 lb (74.8 kg)  ?   Height '5\' 9"'$  (1.753 m)  ?   Head Circumference   ?   Peak Flow   ?   Pain Score 5  ?   Pain Loc   ?   Pain Edu?   ?   Excl. in [place]?   ? ? ?Most recent vital signs: ?Vitals:  ? 10/18/21 1627  ?BP: 127/76  ?Pulse: 76  ?Resp: 16  ?Temp: 98.3 ?F (36.8 ?C)  ?SpO2: 100%  ? ? ?General: Awake, NAD.  ?Skin: Warm, dry.  ?CV: Good peripheral perfusion.  ?Resp: Normal effort.  ?Abd: Soft, non-tender. No distention.  ?Neuro: At baseline. No gross neurological deficits.  ?Other: PERRL. EOMI.  No subconjunctival hemorrhage.  Cornea does not appear hazy.  Fluorescein examination unremarkable; no evidence of corneal abrasion/ulcer.  Peripheral visual fields intact.  Tonometry pressures consistently between 10 and 14.  Unable to appreciate any significant findings on fundoscopy.  Visual acuity intact, see nurse  documentation. ? ?Physical Exam ? ? ? ?ED Results / Procedures / Treatments  ?Labs ?(all labs ordered are listed, but only abnormal results are displayed) ?Labs Reviewed - No data to display ? ? ?EKG ?Not applicable. ? ? ?RADIOLOGY ? ?ED Provider Interpretation: Not applicable. ? ?No results found. ? ?PROCEDURES: ? ?Critical Care performed: Not applicable. ? ?Procedures ? ? ? ?MEDICATIONS ORDERED IN ED: ?Medications  ?tetracaine (PONTOCAINE) 0.5 % ophthalmic solution 1-2 drop (has no administration in time range)  ?fluorescein ophthalmic strip 1 strip (has no administration in time range)  ? ? ? ?IMPRESSION / MDM / ASSESSMENT AND PLAN / ED COURSE  ?I reviewed the triage vital signs and the nursing notes. ?             ?               ? ?Differential diagnosis includes, but is not limited to, acute angle-closure glaucoma, periorbital/orbital cellulitis, scleritis, uveitis, conjunctivitis, corneal abrasion, dry. ? ? ?ED Course ?Patient appears well.  Vital signs within normal limits.  NAD.  Afebrile ? ? ?Assessment/Plan ?Given the patient's history and physical exam, I do not suspect any serious or life-threatening pathology.  Visual acuity intact.  No peripheral visual field deficits, unlikely retinal detachment or  retinal artery occlusion.  Tonometry pressures within normal limits, unlikely angle-closure glaucoma.  No surrounding erythema, tenderness, or fever to suggest orbital cellulitis.  Advised patient to call her ophthalmologist and have him schedule an appointment soon. ? ?Patient was provided with anticipatory guidance, return precautions, and educational material. Encouraged the patient to return to the emergency department at any time if they begin to experience any new or worsening symptoms.  ? ?  ? ? ?FINAL CLINICAL IMPRESSION(S) / ED DIAGNOSES  ? ?Final diagnoses:  ?Left eye pain  ? ? ? ?Rx / DC Orders  ? ?ED Discharge Orders   ? ? None  ? ?  ? ? ? ?Note:  This document was prepared using Dragon voice  recognition software and may include unintentional dictation errors. ?  ?Teodoro Spray, [place] ?10/18/21 2020 ? ?  ?Rada Hay, MD ?10/19/21 1724 ? ?

## 2021-10-18 NOTE — ED Notes (Signed)
Pt getting ride from friend's husband. Pt tearful, states she's upset that she cannot be independent anymore. Pt provided reassurance.  ?

## 2021-10-21 DIAGNOSIS — H40052 Ocular hypertension, left eye: Secondary | ICD-10-CM | POA: Diagnosis not present

## 2021-10-22 ENCOUNTER — Other Ambulatory Visit: Payer: Self-pay | Admitting: Ophthalmology

## 2021-10-22 DIAGNOSIS — R519 Headache, unspecified: Secondary | ICD-10-CM

## 2021-10-22 DIAGNOSIS — H5710 Ocular pain, unspecified eye: Secondary | ICD-10-CM

## 2021-10-31 ENCOUNTER — Other Ambulatory Visit: Payer: PPO

## 2021-11-03 DIAGNOSIS — M6281 Muscle weakness (generalized): Secondary | ICD-10-CM | POA: Diagnosis not present

## 2021-11-03 DIAGNOSIS — R278 Other lack of coordination: Secondary | ICD-10-CM | POA: Diagnosis not present

## 2021-11-03 DIAGNOSIS — G3184 Mild cognitive impairment, so stated: Secondary | ICD-10-CM | POA: Diagnosis not present

## 2021-11-03 DIAGNOSIS — I482 Chronic atrial fibrillation, unspecified: Secondary | ICD-10-CM | POA: Diagnosis not present

## 2021-11-03 DIAGNOSIS — R2689 Other abnormalities of gait and mobility: Secondary | ICD-10-CM | POA: Diagnosis not present

## 2021-11-06 ENCOUNTER — Ambulatory Visit
Admission: RE | Admit: 2021-11-06 | Discharge: 2021-11-06 | Disposition: A | Discharge status: Home / Self Care | Payer: PPO | Source: Ambulatory Visit | Attending: Ophthalmology | Admitting: Ophthalmology

## 2021-11-06 DIAGNOSIS — I639 Cerebral infarction, unspecified: Secondary | ICD-10-CM | POA: Diagnosis not present

## 2021-11-06 DIAGNOSIS — R519 Headache, unspecified: Secondary | ICD-10-CM

## 2021-11-06 DIAGNOSIS — H5710 Ocular pain, unspecified eye: Secondary | ICD-10-CM

## 2021-11-06 MED ORDER — GADOBENATE DIMEGLUMINE 529 MG/ML IV SOLN
15.0000 mL | Freq: Once | INTRAVENOUS | Status: AC | PRN
  Administered 2021-11-06: 15 mL via INTRAVENOUS

## 2021-11-12 DIAGNOSIS — H40003 Preglaucoma, unspecified, bilateral: Secondary | ICD-10-CM | POA: Diagnosis not present

## 2021-11-18 ENCOUNTER — Encounter: Payer: Self-pay | Admitting: Psychiatry

## 2021-11-18 ENCOUNTER — Telehealth (INDEPENDENT_AMBULATORY_CARE_PROVIDER_SITE_OTHER): Payer: PPO | Admitting: Psychiatry

## 2021-11-18 DIAGNOSIS — F908 Attention-deficit hyperactivity disorder, other type: Secondary | ICD-10-CM

## 2021-11-18 DIAGNOSIS — F411 Generalized anxiety disorder: Secondary | ICD-10-CM

## 2021-11-18 DIAGNOSIS — G3184 Mild cognitive impairment, so stated: Secondary | ICD-10-CM | POA: Diagnosis not present

## 2021-11-18 DIAGNOSIS — F331 Major depressive disorder, recurrent, moderate: Secondary | ICD-10-CM | POA: Diagnosis not present
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
## 2021-12-01 DIAGNOSIS — R278 Other lack of coordination: Secondary | ICD-10-CM | POA: Diagnosis not present

## 2021-12-01 DIAGNOSIS — R2689 Other abnormalities of gait and mobility: Secondary | ICD-10-CM | POA: Diagnosis not present

## 2021-12-01 DIAGNOSIS — I482 Chronic atrial fibrillation, unspecified: Secondary | ICD-10-CM | POA: Diagnosis not present

## 2021-12-01 DIAGNOSIS — M6281 Muscle weakness (generalized): Secondary | ICD-10-CM | POA: Diagnosis not present

## 2021-12-01 DIAGNOSIS — G3184 Mild cognitive impairment, so stated: Secondary | ICD-10-CM | POA: Diagnosis not present

## 2021-12-10 DIAGNOSIS — M2042 Other hammer toe(s) (acquired), left foot: Secondary | ICD-10-CM | POA: Diagnosis not present

## 2021-12-10 DIAGNOSIS — L84 Corns and callosities: Secondary | ICD-10-CM | POA: Diagnosis not present

## 2021-12-10 DIAGNOSIS — L603 Nail dystrophy: Secondary | ICD-10-CM | POA: Diagnosis not present

## 2021-12-10 DIAGNOSIS — M2041 Other hammer toe(s) (acquired), right foot: Secondary | ICD-10-CM | POA: Diagnosis not present

## 2021-12-10 DIAGNOSIS — I7091 Generalized atherosclerosis: Secondary | ICD-10-CM | POA: Diagnosis not present

## 2021-12-11 DIAGNOSIS — I4891 Unspecified atrial fibrillation: Secondary | ICD-10-CM | POA: Diagnosis not present

## 2021-12-11 DIAGNOSIS — F0390 Unspecified dementia without behavioral disturbance: Secondary | ICD-10-CM | POA: Diagnosis not present

## 2021-12-11 DIAGNOSIS — Z7901 Long term (current) use of anticoagulants: Secondary | ICD-10-CM | POA: Diagnosis not present

## 2021-12-11 DIAGNOSIS — I739 Peripheral vascular disease, unspecified: Secondary | ICD-10-CM | POA: Diagnosis not present

## 2021-12-11 DIAGNOSIS — D692 Other nonthrombocytopenic purpura: Secondary | ICD-10-CM | POA: Diagnosis not present

## 2021-12-11 DIAGNOSIS — D6869 Other thrombophilia: Secondary | ICD-10-CM | POA: Diagnosis not present

## 2021-12-11 DIAGNOSIS — R410 Disorientation, unspecified: Secondary | ICD-10-CM | POA: Diagnosis not present

## 2021-12-15 DIAGNOSIS — I482 Chronic atrial fibrillation, unspecified: Secondary | ICD-10-CM | POA: Diagnosis not present

## 2021-12-15 DIAGNOSIS — E039 Hypothyroidism, unspecified: Secondary | ICD-10-CM | POA: Diagnosis not present

## 2021-12-15 LAB — BASIC METABOLIC PANEL
BUN: 15 (ref 4–21)
CO2: 30 — AB (ref 13–22)
Chloride: 96 — AB (ref 99–108)
Creatinine: 0.7 (ref 0.5–1.1)
Glucose: 94
Potassium: 4.3 mEq/L (ref 3.5–5.1)
Sodium: 132 — AB (ref 137–147)

## 2021-12-15 LAB — COMPREHENSIVE METABOLIC PANEL
Albumin: 4.3 (ref 3.5–5.0)
Calcium: 9.2 (ref 8.7–10.7)
Globulin: 2.3

## 2021-12-15 LAB — LIPID PANEL
Cholesterol: 196 (ref 0–200)
HDL: 105 — AB (ref 35–70)
LDL Cholesterol: 77
Triglycerides: 62 (ref 40–160)

## 2021-12-15 LAB — CBC: RBC: 4.14 (ref 3.87–5.11)

## 2021-12-15 LAB — CBC AND DIFFERENTIAL
HCT: 37 (ref 36–46)
Hemoglobin: 12.6 (ref 12.0–16.0)
Platelets: 222 10*3/uL (ref 150–400)
WBC: 3.5

## 2021-12-15 LAB — HEPATIC FUNCTION PANEL
ALT: 12 U/L (ref 7–35)
AST: 16 (ref 13–35)
Alkaline Phosphatase: 87 (ref 25–125)

## 2021-12-15 NOTE — Telephone Encounter (Signed)
See messages below-Prolia card has now been archived ?

## 2021-12-31 DIAGNOSIS — R251 Tremor, unspecified: Secondary | ICD-10-CM | POA: Diagnosis not present

## 2021-12-31 DIAGNOSIS — R413 Other amnesia: Secondary | ICD-10-CM | POA: Diagnosis not present

## 2021-12-31 DIAGNOSIS — F339 Major depressive disorder, recurrent, unspecified: Secondary | ICD-10-CM | POA: Diagnosis not present

## 2021-12-31 DIAGNOSIS — Z8673 Personal history of transient ischemic attack (TIA), and cerebral infarction without residual deficits: Secondary | ICD-10-CM | POA: Diagnosis not present

## 2022-01-07 ENCOUNTER — Other Ambulatory Visit: Payer: Self-pay

## 2022-01-07 DIAGNOSIS — F908 Attention-deficit hyperactivity disorder, other type: Secondary | ICD-10-CM

## 2022-02-10 DIAGNOSIS — L821 Other seborrheic keratosis: Secondary | ICD-10-CM | POA: Diagnosis not present

## 2022-02-10 DIAGNOSIS — L648 Other androgenic alopecia: Secondary | ICD-10-CM | POA: Diagnosis not present

## 2022-02-10 DIAGNOSIS — D225 Melanocytic nevi of trunk: Secondary | ICD-10-CM | POA: Diagnosis not present

## 2022-02-10 DIAGNOSIS — D2272 Melanocytic nevi of left lower limb, including hip: Secondary | ICD-10-CM | POA: Diagnosis not present

## 2022-02-10 DIAGNOSIS — D2271 Melanocytic nevi of right lower limb, including hip: Secondary | ICD-10-CM | POA: Diagnosis not present

## 2022-02-10 DIAGNOSIS — L298 Other pruritus: Secondary | ICD-10-CM | POA: Diagnosis not present

## 2022-02-10 DIAGNOSIS — D2261 Melanocytic nevi of right upper limb, including shoulder: Secondary | ICD-10-CM | POA: Diagnosis not present

## 2022-02-10 DIAGNOSIS — D2262 Melanocytic nevi of left upper limb, including shoulder: Secondary | ICD-10-CM | POA: Diagnosis not present

## 2022-02-12 DIAGNOSIS — I7091 Generalized atherosclerosis: Secondary | ICD-10-CM | POA: Diagnosis not present

## 2022-02-12 DIAGNOSIS — L603 Nail dystrophy: Secondary | ICD-10-CM | POA: Diagnosis not present

## 2022-02-17 ENCOUNTER — Telehealth: Payer: PPO | Admitting: Psychiatry

## 2022-02-19 ENCOUNTER — Telehealth: Payer: Self-pay | Admitting: Internal Medicine

## 2022-02-19 ENCOUNTER — Encounter: Payer: Self-pay | Admitting: Internal Medicine

## 2022-02-24 DIAGNOSIS — Z9181 History of falling: Secondary | ICD-10-CM | POA: Diagnosis not present

## 2022-02-24 DIAGNOSIS — S82201D Unspecified fracture of shaft of right tibia, subsequent encounter for closed fracture with routine healing: Secondary | ICD-10-CM | POA: Diagnosis not present

## 2022-02-24 DIAGNOSIS — R2689 Other abnormalities of gait and mobility: Secondary | ICD-10-CM | POA: Diagnosis not present

## 2022-02-26 ENCOUNTER — Telehealth: Payer: PPO | Admitting: Psychiatry

## 2022-02-26 DIAGNOSIS — M79671 Pain in right foot: Secondary | ICD-10-CM | POA: Diagnosis not present

## 2022-02-26 DIAGNOSIS — M25571 Pain in right ankle and joints of right foot: Secondary | ICD-10-CM | POA: Diagnosis not present

## 2022-03-04 DIAGNOSIS — S82201D Unspecified fracture of shaft of right tibia, subsequent encounter for closed fracture with routine healing: Secondary | ICD-10-CM | POA: Diagnosis not present

## 2022-03-04 DIAGNOSIS — R2689 Other abnormalities of gait and mobility: Secondary | ICD-10-CM | POA: Diagnosis not present

## 2022-03-04 DIAGNOSIS — Z9181 History of falling: Secondary | ICD-10-CM | POA: Diagnosis not present

## 2022-03-06 ENCOUNTER — Non-Acute Institutional Stay: Payer: PPO | Admitting: Internal Medicine

## 2022-03-06 ENCOUNTER — Encounter: Payer: Self-pay | Admitting: Internal Medicine

## 2022-03-06 VITALS — BP 95/55 | HR 72 | Temp 97.4°F | Resp 18 | Wt 161.6 lb

## 2022-03-06 DIAGNOSIS — F900 Attention-deficit hyperactivity disorder, predominantly inattentive type: Secondary | ICD-10-CM

## 2022-03-06 DIAGNOSIS — I480 Paroxysmal atrial fibrillation: Secondary | ICD-10-CM

## 2022-03-06 DIAGNOSIS — M8000XP Age-related osteoporosis with current pathological fracture, unspecified site, subsequent encounter for fracture with malunion: Secondary | ICD-10-CM

## 2022-03-06 DIAGNOSIS — E039 Hypothyroidism, unspecified: Secondary | ICD-10-CM | POA: Diagnosis not present

## 2022-03-06 DIAGNOSIS — F39 Unspecified mood [affective] disorder: Secondary | ICD-10-CM | POA: Diagnosis not present

## 2022-03-06 DIAGNOSIS — E782 Mixed hyperlipidemia: Secondary | ICD-10-CM | POA: Diagnosis not present

## 2022-03-06 DIAGNOSIS — M79671 Pain in right foot: Secondary | ICD-10-CM | POA: Diagnosis not present

## 2022-03-06 DIAGNOSIS — I63412 Cerebral infarction due to embolism of left middle cerebral artery: Secondary | ICD-10-CM

## 2022-03-06 DIAGNOSIS — R413 Other amnesia: Secondary | ICD-10-CM | POA: Diagnosis not present

## 2022-03-06 NOTE — Assessment & Plan Note (Signed)
Encourage daily weightbearing exercise

## 2022-03-06 NOTE — Assessment & Plan Note (Signed)
Continue flecainide and Eliquis

## 2022-03-06 NOTE — Assessment & Plan Note (Signed)
We will trial to hold Adderall x2 weeks and DC if no increase in fatigue or depression

## 2022-03-06 NOTE — Assessment & Plan Note (Signed)
Continue duloxetine, bupropion, lorazepam and gabapentin

## 2022-03-06 NOTE — Assessment & Plan Note (Signed)
Continue donepezil 

## 2022-03-06 NOTE — Progress Notes (Signed)
Subjective:    Patient ID: Monica Whitaker, female    DOB: Aug 26, 1945, 76 y.o.   MRN: [id number]  HPI  Resident seen in [facility] RN reports resident has been complaining of right foot pain.  No known injury.  X-ray was obtained and did not show any acute findings.  Resident reports no complaints of foot pain at this time. Her sleep varies.  Sometimes she has to take melatonin in the middle of the night.  She is independent with ADLs.  She walks without a device.  She denies recent falls.  Her appetite is good, weight has been stable.  She denies any issues with her bowel or bladder.  She denies any joint pain.  She report her mood varies depending on the day.  A-fib: She denies palpitations.  She is taking Flecainide and Eliquis as prescribed.  ECG from 10/2021 reviewed.  ADHD: She is taking Adderall as prescribed.  She follows with psychiatry.  HLD with history of CVA: Her last LDL was 96, triglycerides 63, 2021.  She is taking Pravastatin and Aspirin as prescribed.  She tries to consume a low-fat diet.  Hypothyroidism: She denies any issues on her current dose of Levothyroxine.  She does not follow with endocrinology.  MCI: Mild, managed on Donepezil.  She follows with neurology.  Mood disorder: Anxiety and depression, chronic.  She is taking Gabapentin, bupropion, Duloxetine and Ativan as prescribed.  She is not currently seeing a therapist.  Osteoporosis: She is not being treated for this at this time.  She does continue to weight-bear.  Insomnia: She has difficulty falling and staying asleep.  She is taking Trazodone and Gabapentin as prescribed.  Review of Systems     Past Medical History:  Diagnosis Date   Actinic keratosis 12/20/2012   left nasal tip   Anxiety    controlled with meds   Anxiety    Arrhythmia    Atrial fibrillation ([name])    paroxysmal, failed medical therapy with flecainide,  NSVT with tikosyn   Basal cell carcinoma 12/20/2012   left proximal mandible    Bruises easily    CVA (cerebral vascular accident) ([place]) 12/2007   Depression    medically controlled    Glaucoma    also with macular holes AE Dr. Thomasene Ripple   History of loop recorder    Hypothyroidism    Low blood pressure    no falls, but if gets up to fast   RLS (restless legs syndrome)    Stroke ([place]) 7 years ago   Lasting effects on balance, different personality.   Thyroid disease    Tremor    Ventricular tachycardia ([place])    pt told at [facility] that she had RVOT VT    Current Outpatient Medications  Medication Sig Dispense Refill   acetaminophen (TYLENOL) 500 MG tablet Take 1,000 mg by mouth every 6 (six) hours as needed for mild pain or moderate pain.     amphetamine-dextroamphetamine (ADDERALL XR) 10 MG 24 hr capsule Take 1 capsule (10 mg total) by mouth daily. 90 capsule 0   apixaban (ELIQUIS) 5 MG TABS tablet Take 1 tablet (5 mg total) by mouth in the morning and at bedtime. 60 tablet 0   buPROPion ER (WELLBUTRIN SR) 100 MG 12 hr tablet Take 100 mg by mouth 2 (two) times daily.     donepezil (ARICEPT) 10 MG tablet Take 10 mg by mouth daily.     DULoxetine (CYMBALTA) 60 MG capsule TAKE 1  CAPSULE BY MOUTH 2 TIMES DAILY. 180 capsule 1   flecainide (TAMBOCOR) 50 MG tablet Take 100 mg by mouth 2 (two) times daily.     gabapentin (NEURONTIN) 100 MG capsule Take 1 capsule (100 mg total) by mouth 3 (three) times daily. For pill pack and delivery 270 capsule 0   levothyroxine (SYNTHROID) 25 MCG tablet TAKE 1 TABLET (25 MCG TOTAL) BY MOUTH DAILY BEFORE BREAKFAST. 30 MINUTES BEFORE FOOD 90 tablet 3   LORazepam (ATIVAN) 0.5 MG tablet TAKE 1 TABLET (0.5 MG TOTAL) BY MOUTH EVERY 8 (EIGHT) HOURS. 90 tablet 5   Multiple Vitamin (MULTIVITAMIN WITH MINERALS) TABS tablet Take 1 tablet by mouth daily.     ondansetron (ZOFRAN) 4 MG tablet Take 4 mg by mouth every 8 (eight) hours as needed for nausea.     pravastatin (PRAVACHOL) 10 MG tablet Take 1 tablet (10 mg total) by mouth at bedtime.  Take a whole pill for pill pack and delivery 90 tablet 3   timolol (TIMOPTIC) 0.5 % ophthalmic solution Place 1 drop into the left eye at bedtime.      traZODone (DESYREL) 50 MG tablet Take 0.5 tablets (25 mg total) by mouth at bedtime as needed for sleep. 30 tablet 3   No current facility-administered medications for this visit.    Allergies  Allergen Reactions   Darvon Other (See Comments)    Severe panic attacks   Propoxyphene Other (See Comments)    GI Upset Severe panic attacks GI Upset   Propoxyphene N-Acetaminophen Other (See Comments)    Severe panic attacks   Levofloxacin Anxiety and Other (See Comments)    Causes panic attacks.    Family History  Problem Relation Age of Onset   Multiple sclerosis Father    Breast cancer Neg Hx     Social History   Socioeconomic History   Marital status: Widowed    Spouse name: Not on file   Number of children: 0   Years of education: Not on file   Highest education level: Not on file  Occupational History   Occupation: Retired    Fish farm manager: RETIRED  Tobacco Use   Smoking status: Never   Smokeless tobacco: Never  Vaping Use   Vaping Use: Never used  Substance and Sexual Activity   Alcohol use: Yes    Alcohol/week: 2.0 standard drinks of alcohol    Types: 2 Standard drinks or equivalent per week    Comment: regular   Drug use: No   Sexual activity: Yes  Other Topics Concern   Not on file  Social History Narrative   Lives in [place] with her husband.  No children 2 step kids.  Former Psychologist, prison and probation services   Enjoys exercise- water classes, yoga Editor, commissioning.    Widowed 2019    Only child and mother was only child   Used to sail with husband      Has a living will-    Would desire CPR   Would not want prolonged life support if futile.      Has 1 cat lives alone no kids but see above    Social Determinants of Health   Financial Resource Strain: Low Risk  (08/09/2019)   Overall Financial Resource Strain (CARDIA)     Difficulty of Paying Living Expenses: Not hard at all  Food Insecurity: No Food Insecurity (08/09/2019)   Hunger Vital Sign    Worried About Running Out of Food in the Last Year: Never true  Ran Out of Food in the Last Year: Never true  Transportation Needs: No Transportation Needs (08/09/2019)   PRAPARE - Hydrologist (Medical): No    Lack of Transportation (Non-Medical): No  Physical Activity: Sufficiently Active (08/09/2019)   Exercise Vital Sign    Days of Exercise per Week: 7 days    Minutes of Exercise per Session: 60 min  Stress: No Stress Concern Present (08/09/2019)   [place]    Feeling of Stress : Only a little  Social Connections: Unknown (08/09/2019)   Social Connection and Isolation Panel [NHANES]    Frequency of Communication with Friends and Family: Three times a week    Frequency of Social Gatherings with Friends and Family: Once a week    Attends Religious Services: Not on file    Active Member of Clubs or Organizations: Not on file    Attends Archivist Meetings: Not on file    Marital Status: Widowed  Intimate Partner Violence: Not At Risk (08/09/2019)   Humiliation, Afraid, Rape, and Kick questionnaire    Fear of Current or Ex-Partner: No    Emotionally Abused: No    Physically Abused: No    Sexually Abused: No     Constitutional: Denies fever, malaise, fatigue, headache or abrupt weight changes.  HEENT: Denies eye pain, eye redness, ear pain, ringing in the ears, wax buildup, runny nose, nasal congestion, bloody nose, or sore throat. Respiratory: Denies difficulty breathing, shortness of breath, cough or sputum production.   Cardiovascular: Denies chest pain, chest tightness, palpitations or swelling in the hands or feet.  Gastrointestinal: Denies abdominal pain, bloating, constipation, diarrhea or blood in the stool.  GU: Denies urgency, frequency, pain with  urination, burning sensation, blood in urine, odor or discharge. Musculoskeletal: Denies decrease in range of motion, difficulty with gait, muscle pain or joint pain and swelling.  Skin: Denies redness, rashes, lesions or ulcercations.  Neurological: Patient reports difficulty with memory.  Denies dizziness, difficulty with speech or problems with balance and coordination.  Psych: Patient has a history of anxiety and depression.  Denies SI/HI.  No other specific complaints in a complete review of systems (except as listed in HPI above).  Objective:   Physical Exam  BP (!) 95/55   Pulse 72   Temp (!) 97.4 F (36.3 C)   Resp 18   Wt 161 lb 9.6 oz (73.3 kg)   SpO2 95%   BMI 23.86 kg/m  Wt Readings from Last 3 Encounters:  03/06/22 161 lb 9.6 oz (73.3 kg)  10/18/21 165 lb (74.8 kg)  10/17/21 166 lb 6.4 oz (75.5 kg)    General: Appears her stated age, well developed, well nourished in NAD. Skin: Warm, dry and intact.  HEENT: Head: normal shape and size; Eyes: sclera white, no icterus, conjunctiva pink, PERRLA and EOMs intact;  Neck:  Neck supple, trachea midline. No masses, lumps or thyromegaly present.  Cardiovascular: Normal rate and rhythm. S1,S2 noted.  No murmur, rubs or gallops noted. No JVD or BLE edema. No carotid bruits noted. Pulmonary/Chest: Normal effort and positive vesicular breath sounds. No respiratory distress. No wheezes, rales or ronchi noted.  Abdomen: Soft and nontender.  Musculoskeletal:  No difficulty with gait.  Neurological: Alert and oriented.  Seems little confused today. Psychiatric: Mood and affect flat. Behavior is normal. Judgment and thought content normal.    BMET    Component Value Date/Time   NA  138 12/17/2020 0620   NA 139 08/20/2017 1619   NA 136 11/07/2014 1539   K 3.7 12/17/2020 0620   K 4.3 11/07/2014 1539   CL 105 12/17/2020 0620   CL 100 (L) 11/07/2014 1539   CO2 26 12/17/2020 0620   CO2 28 11/07/2014 1539   GLUCOSE 105 (H)  12/17/2020 0620   GLUCOSE 107 (H) 11/07/2014 1539   BUN 22 12/17/2020 0620   BUN 17 08/20/2017 1619   BUN 22 (H) 11/07/2014 1539   CREATININE 0.56 12/17/2020 0620   CREATININE 0.60 11/07/2014 1539   CALCIUM 8.3 (L) 12/17/2020 0620   CALCIUM 9.2 11/07/2014 1539   GFRNONAA >60 12/17/2020 0620   GFRNONAA >60 11/07/2014 1539   GFRAA >60 02/17/2020 0702   GFRAA >60 11/07/2014 1539    Lipid Panel     Component Value Date/Time   CHOL 205 (H) 07/02/2020 1059   TRIG 63.0 07/02/2020 1059   HDL 96.20 07/02/2020 1059   CHOLHDL 2 07/02/2020 1059   VLDL 12.6 07/02/2020 1059   LDLCALC 96 07/02/2020 1059   LDLCALC 99 05/07/2017 1619    CBC    Component Value Date/Time   WBC 5.6 12/17/2020 0620   RBC 3.67 (L) 12/17/2020 0620   HGB 11.1 (L) 12/17/2020 0620   HGB 13.0 08/20/2017 1619   HCT 33.4 (L) 12/17/2020 0620   HCT 38.1 08/20/2017 1619   PLT 222 12/17/2020 0620   PLT 286 08/20/2017 1619   MCV 91.0 12/17/2020 0620   MCV 89 08/20/2017 1619   MCV 92 11/07/2014 1539   MCH 30.2 12/17/2020 0620   MCHC 33.2 12/17/2020 0620   RDW 14.2 12/17/2020 0620   RDW 14.1 08/20/2017 1619   RDW 13.9 11/07/2014 1539   LYMPHSABS 1.7 12/16/2020 1519   MONOABS 0.9 12/16/2020 1519   EOSABS 0.1 12/16/2020 1519   BASOSABS 0.1 12/16/2020 1519    Hgb A1C Lab Results  Component Value Date   HGBA1C 5.6 02/14/2020            Assessment & Plan:   Right Foot Pain:  X-ray reviewed Symptoms have resolved We will continue to monitor  Return precautions discussed

## 2022-03-06 NOTE — Assessment & Plan Note (Signed)
Continue pravastatin and Eliquis

## 2022-03-06 NOTE — Assessment & Plan Note (Signed)
Continue levothyroxine We will check free T4 yearly

## 2022-03-27 ENCOUNTER — Encounter: Payer: Self-pay | Admitting: Internal Medicine

## 2022-03-27 ENCOUNTER — Non-Acute Institutional Stay: Payer: PPO | Admitting: Internal Medicine

## 2022-03-27 VITALS — BP 93/55 | HR 76 | Resp 18 | Wt 161.0 lb

## 2022-03-27 DIAGNOSIS — L739 Follicular disorder, unspecified: Secondary | ICD-10-CM | POA: Diagnosis not present

## 2022-03-27 NOTE — Patient Instructions (Signed)
Folliculitis  Folliculitis is inflammation of the hair follicles. Folliculitis most commonly occurs on the scalp, thighs, legs, back, and buttocks. However, it can occur anywhere on the body. What are the causes? This condition may be caused by: A bacterial infection (common). A fungal infection. A viral infection. Contact with certain chemicals, especially oils and tars. Shaving or waxing. Greasy ointments or creams applied to the skin. Long-lasting folliculitis and folliculitis that keeps coming back may be caused by bacteria. This bacteria can live anywhere on your skin and is often found in the nostrils. What increases the risk? You are more likely to develop this condition if you have: A weakened immune system. Diabetes. Obesity. What are the signs or symptoms? Symptoms of this condition include: Redness. Soreness. Swelling. Itching. Small white or yellow, pus-filled, itchy spots (pustules) that appear over a reddened area. If there is an infection that goes deep into the follicle, these may develop into a boil (furuncle). A group of closely packed boils (carbuncle). These tend to form in hairy, sweaty areas of the body. How is this diagnosed? This condition is diagnosed with a skin exam. To find what is causing the condition, your health care provider may take a sample of one of the pustules or boils for testing in a lab. How is this treated? This condition may be treated by: Applying warm compresses to the affected areas. Taking an antibiotic medicine or applying an antibiotic medicine to the skin. Applying or bathing with an antiseptic solution. Taking an over-the-counter medicine to help with itching. Having a procedure to drain any pustules or boils. This may be done if a pustule or boil contains a lot of pus or fluid. Having laser hair removal. This may be done to treat long-lasting folliculitis. Follow these instructions at home: Managing pain and swelling  If  directed, apply heat to the affected area as often as told by your health care provider. Use the heat source that your health care provider recommends, such as a moist heat pack or a heating pad. Place a towel between your skin and the heat source. Leave the heat on for 20-30 minutes. Remove the heat if your skin turns bright red. This is especially important if you are unable to feel pain, heat, or cold. You may have a greater risk of getting burned. General instructions If you were prescribed an antibiotic medicine, take it or apply it as told by your health care provider. Do not stop using the antibiotic even if your condition improves. Check the irritated area every day for signs of infection. Check for: Redness, swelling, or pain. Fluid or blood. Warmth. Pus or a bad smell. Do not shave irritated skin. Take over-the-counter and prescription medicines only as told by your health care provider. Keep all follow-up visits as told by your health care provider. This is important. Get help right away if: You have more redness, swelling, or pain in the affected area. Red streaks are spreading from the affected area. You have a fever. Summary Folliculitis is inflammation of the hair follicles. Folliculitis most commonly occurs on the scalp, thighs, legs, back, and buttocks. This condition may be treated by taking an antibiotic medicine or applying an antibiotic medicine to the skin, and applying or bathing with an antiseptic solution. If you were prescribed an antibiotic medicine, take it or apply it as told by your health care provider. Do not stop using the antibiotic even if your condition improves. Get help right away if you have new  or worsening symptoms. Keep all follow-up visits as told by your health care provider. This is important. This information is not intended to replace advice given to you by your health care provider. Make sure you discuss any questions you have with your health  care provider. Document Revised: 09/24/2021 Document Reviewed: 05/15/2021 Elsevier Patient Education  [place].

## 2022-03-27 NOTE — Progress Notes (Signed)
Subjective:    Patient ID: Monica Whitaker, female    DOB: 1946-03-12, 76 y.o.   MRN: [id number]  HPI  Resident seen in [address] She reports bumps to her right labia.  She reports these bumps have been there for years.  She reports they do not itch, burn and are not painful.  She has not noticed any drainage from the area.  She was wearing a urinary incontinence pad despite the fact that she is not having incontinence.  She stopped doing this to see if there were any improvement in her symptoms.  She reports that has been too soon to tell she just not doing this the other day.  She has not tried anything OTC for this.  Review of Systems     Past Medical History:  Diagnosis Date   Actinic keratosis 12/20/2012   left nasal tip   Anxiety    controlled with meds   Anxiety    Arrhythmia    Atrial fibrillation ([name])    paroxysmal, failed medical therapy with flecainide,  NSVT with tikosyn   Basal cell carcinoma 12/20/2012   left proximal mandible   Bruises easily    CVA (cerebral vascular accident) ([place]) 12/2007   Depression    medically controlled    Glaucoma    also with macular holes AE Dr. Thomasene Ripple   History of loop recorder    Hypothyroidism    Low blood pressure    no falls, but if gets up to fast   RLS (restless legs syndrome)    Stroke ([place]) 7 years ago   Lasting effects on balance, different personality.   Thyroid disease    Tremor    Ventricular tachycardia ([place])    pt told at [facility] that she had RVOT VT    Current Outpatient Medications  Medication Sig Dispense Refill   acetaminophen (TYLENOL) 500 MG tablet Take 1,000 mg by mouth every 6 (six) hours as needed for mild pain or moderate pain.     amphetamine-dextroamphetamine (ADDERALL XR) 10 MG 24 hr capsule Take 1 capsule (10 mg total) by mouth daily. 90 capsule 0   apixaban (ELIQUIS) 5 MG TABS tablet Take 1 tablet (5 mg total) by mouth in the morning and at bedtime. 60 tablet 0   buPROPion ER (WELLBUTRIN SR)  100 MG 12 hr tablet Take 100 mg by mouth 2 (two) times daily.     donepezil (ARICEPT) 10 MG tablet Take 10 mg by mouth daily.     DULoxetine (CYMBALTA) 60 MG capsule TAKE 1 CAPSULE BY MOUTH 2 TIMES DAILY. 180 capsule 1   flecainide (TAMBOCOR) 50 MG tablet Take 100 mg by mouth 2 (two) times daily.     gabapentin (NEURONTIN) 100 MG capsule Take 1 capsule (100 mg total) by mouth 3 (three) times daily. For pill pack and delivery 270 capsule 0   levothyroxine (SYNTHROID) 25 MCG tablet TAKE 1 TABLET (25 MCG TOTAL) BY MOUTH DAILY BEFORE BREAKFAST. 30 MINUTES BEFORE FOOD 90 tablet 3   LORazepam (ATIVAN) 0.5 MG tablet TAKE 1 TABLET (0.5 MG TOTAL) BY MOUTH EVERY 8 (EIGHT) HOURS. 90 tablet 5   Multiple Vitamin (MULTIVITAMIN WITH MINERALS) TABS tablet Take 1 tablet by mouth daily.     ondansetron (ZOFRAN) 4 MG tablet Take 4 mg by mouth every 8 (eight) hours as needed for nausea.     pravastatin (PRAVACHOL) 10 MG tablet Take 1 tablet (10 mg total) by mouth at bedtime. Take a whole pill  for pill pack and delivery 90 tablet 3   timolol (TIMOPTIC) 0.5 % ophthalmic solution Place 1 drop into the left eye at bedtime.      traZODone (DESYREL) 50 MG tablet Take 0.5 tablets (25 mg total) by mouth at bedtime as needed for sleep. 30 tablet 3   No current facility-administered medications for this visit.    Allergies  Allergen Reactions   Darvon Other (See Comments)    Severe panic attacks   Propoxyphene Other (See Comments)    GI Upset Severe panic attacks GI Upset   Propoxyphene N-Acetaminophen Other (See Comments)    Severe panic attacks   Levofloxacin Anxiety and Other (See Comments)    Causes panic attacks.    Family History  Problem Relation Age of Onset   Multiple sclerosis Father    Breast cancer Neg Hx     Social History   Socioeconomic History   Marital status: Widowed    Spouse name: Not on file   Number of children: 0   Years of education: Not on file   Highest education level: Not on  file  Occupational History   Occupation: Retired    Fish farm manager: RETIRED  Tobacco Use   Smoking status: Never   Smokeless tobacco: Never  Vaping Use   Vaping Use: Never used  Substance and Sexual Activity   Alcohol use: Yes    Alcohol/week: 2.0 standard drinks of alcohol    Types: 2 Standard drinks or equivalent per week    Comment: regular   Drug use: No   Sexual activity: Yes  Other Topics Concern   Not on file  Social History Narrative   Lives in [place] with her husband.  No children 2 step kids.  Former Psychologist, prison and probation services   Enjoys exercise- water classes, yoga Editor, commissioning.    Widowed 2019    Only child and mother was only child   Used to sail with husband      Has a living will-    Would desire CPR   Would not want prolonged life support if futile.      Has 1 cat lives alone no kids but see above    Social Determinants of Health   Financial Resource Strain: Low Risk  (08/09/2019)   Overall Financial Resource Strain (CARDIA)    Difficulty of Paying Living Expenses: Not hard at all  Food Insecurity: No Food Insecurity (08/09/2019)   Hunger Vital Sign    Worried About Running Out of Food in the Last Year: Never true    Ran Out of Food in the Last Year: Never true  Transportation Needs: No Transportation Needs (08/09/2019)   PRAPARE - Hydrologist (Medical): No    Lack of Transportation (Non-Medical): No  Physical Activity: Sufficiently Active (08/09/2019)   Exercise Vital Sign    Days of Exercise per Week: 7 days    Minutes of Exercise per Session: 60 min  Stress: No Stress Concern Present (08/09/2019)   [place]    Feeling of Stress : Only a little  Social Connections: Unknown (08/09/2019)   Social Connection and Isolation Panel [NHANES]    Frequency of Communication with Friends and Family: Three times a week    Frequency of Social Gatherings with Friends and Family: Once a  week    Attends Religious Services: Not on file    Active Member of Clubs or Organizations: Not on file  Attends Archivist Meetings: Not on file    Marital Status: Widowed  Intimate Partner Violence: Not At Risk (08/09/2019)   Humiliation, Afraid, Rape, and Kick questionnaire    Fear of Current or Ex-Partner: No    Emotionally Abused: No    Physically Abused: No    Sexually Abused: No     Constitutional: Denies fever, malaise, fatigue, headache or abrupt weight changes.  Respiratory: Denies difficulty breathing, shortness of breath, cough or sputum production.   Cardiovascular: Denies chest pain, chest tightness, palpitations or swelling in the hands or feet.  Gastrointestinal: Denies abdominal pain, bloating, constipation, diarrhea or blood in the stool.  GU: Denies urgency, frequency, pain with urination, burning sensation, blood in urine, odor or discharge. Skin: Patient reports bumps in the vaginal area.  Denies redness, rashes, or ulcercations.   No other specific complaints in a complete review of systems (except as listed in HPI above).  Objective:   Physical Exam   BP (!) 93/55   Pulse 76   Resp 18   Wt 161 lb (73 kg)   SpO2 95%   BMI 23.78 kg/m  Wt Readings from Last 3 Encounters:  03/27/22 161 lb (73 kg)  03/06/22 161 lb 9.6 oz (73.3 kg)  10/18/21 165 lb (74.8 kg)    General: Appears her stated age, well developed, well nourished in NAD. Cardiovascular: Normal rate and rhythm.  Pulmonary/Chest: Normal effort and positive vesicular breath sounds. No respiratory distress. No wheezes, rales or ronchi noted.  Pelvic: Normal female anatomy.  Enlarged follicles noted of the base of the right labia.  No inflammation or drainage noted.  BMET    Component Value Date/Time   NA 138 12/17/2020 0620   NA 139 08/20/2017 1619   NA 136 11/07/2014 1539   K 3.7 12/17/2020 0620   K 4.3 11/07/2014 1539   CL 105 12/17/2020 0620   CL 100 (L) 11/07/2014 1539   CO2  26 12/17/2020 0620   CO2 28 11/07/2014 1539   GLUCOSE 105 (H) 12/17/2020 0620   GLUCOSE 107 (H) 11/07/2014 1539   BUN 22 12/17/2020 0620   BUN 17 08/20/2017 1619   BUN 22 (H) 11/07/2014 1539   CREATININE 0.56 12/17/2020 0620   CREATININE 0.60 11/07/2014 1539   CALCIUM 8.3 (L) 12/17/2020 0620   CALCIUM 9.2 11/07/2014 1539   GFRNONAA >60 12/17/2020 0620   GFRNONAA >60 11/07/2014 1539   GFRAA >60 02/17/2020 0702   GFRAA >60 11/07/2014 1539    Lipid Panel     Component Value Date/Time   CHOL 205 (H) 07/02/2020 1059   TRIG 63.0 07/02/2020 1059   HDL 96.20 07/02/2020 1059   CHOLHDL 2 07/02/2020 1059   VLDL 12.6 07/02/2020 1059   LDLCALC 96 07/02/2020 1059   LDLCALC 99 05/07/2017 1619    CBC    Component Value Date/Time   WBC 5.6 12/17/2020 0620   RBC 3.67 (L) 12/17/2020 0620   HGB 11.1 (L) 12/17/2020 0620   HGB 13.0 08/20/2017 1619   HCT 33.4 (L) 12/17/2020 0620   HCT 38.1 08/20/2017 1619   PLT 222 12/17/2020 0620   PLT 286 08/20/2017 1619   MCV 91.0 12/17/2020 0620   MCV 89 08/20/2017 1619   MCV 92 11/07/2014 1539   MCH 30.2 12/17/2020 0620   MCHC 33.2 12/17/2020 0620   RDW 14.2 12/17/2020 0620   RDW 14.1 08/20/2017 1619   RDW 13.9 11/07/2014 1539   LYMPHSABS 1.7 12/16/2020 1519   MONOABS  0.9 12/16/2020 1519   EOSABS 0.1 12/16/2020 1519   BASOSABS 0.1 12/16/2020 1519    Hgb A1C Lab Results  Component Value Date   HGBA1C 5.6 02/14/2020           Assessment & Plan:   Folliculitis of Labia:  May be slightly irritated but no signs of infection No indication for topical antibiotics at this time Avoid wearing pads if you are not having issues with urinary incontinence Try to keep this area dry and free from moisture is much as possible  We will reassess as needed Webb Silversmith, NP

## 2022-04-06 DIAGNOSIS — R2689 Other abnormalities of gait and mobility: Secondary | ICD-10-CM | POA: Diagnosis not present

## 2022-04-06 DIAGNOSIS — Z9181 History of falling: Secondary | ICD-10-CM | POA: Diagnosis not present

## 2022-04-06 DIAGNOSIS — S82201D Unspecified fracture of shaft of right tibia, subsequent encounter for closed fracture with routine healing: Secondary | ICD-10-CM | POA: Diagnosis not present

## 2022-04-21 DIAGNOSIS — B351 Tinea unguium: Secondary | ICD-10-CM | POA: Diagnosis not present

## 2022-04-21 DIAGNOSIS — I7091 Generalized atherosclerosis: Secondary | ICD-10-CM | POA: Diagnosis not present

## 2022-04-22 DIAGNOSIS — H40052 Ocular hypertension, left eye: Secondary | ICD-10-CM | POA: Diagnosis not present

## 2022-04-28 ENCOUNTER — Encounter: Payer: Self-pay | Admitting: Psychiatry

## 2022-04-28 ENCOUNTER — Ambulatory Visit (INDEPENDENT_AMBULATORY_CARE_PROVIDER_SITE_OTHER): Payer: PPO | Admitting: Psychiatry

## 2022-04-28 DIAGNOSIS — F908 Attention-deficit hyperactivity disorder, other type: Secondary | ICD-10-CM

## 2022-04-28 DIAGNOSIS — G3184 Mild cognitive impairment, so stated: Secondary | ICD-10-CM

## 2022-04-28 DIAGNOSIS — F331 Major depressive disorder, recurrent, moderate: Secondary | ICD-10-CM

## 2022-04-28 DIAGNOSIS — F411 Generalized anxiety disorder: Secondary | ICD-10-CM

## 2022-04-28 NOTE — Progress Notes (Signed)
Monica Whitaker [id number] 16-Nov-1945 76 y.o.  Video Visit via My Chart  I connected with pt by video using My Chart and verified that I am speaking with the correct person using two identifiers.   I discussed the limitations, risks, security and privacy concerns of performing an evaluation and management service by My Chart  and the availability of in person appointments. I also discussed with the patient that there may be a patient responsible charge related to this service. The patient expressed understanding and agreed to proceed.  I discussed the assessment and treatment plan with the patient. The patient was provided an opportunity to ask questions and all were answered. The patient agreed with the plan and demonstrated an understanding of the instructions.   The patient was advised to call back or seek an in-person evaluation if the symptoms worsen or if the condition fails to improve as anticipated.  I provided 30 minutes of video time during this encounter.  The patient was located at home and the provider was located office. Session from 1:00-1'30 pm  Subjective:   Patient ID:  Monica Whitaker is a 76 y.o. (DOB [birth year]) female.  Chief Complaint:  Chief Complaint  Patient presents with   Follow-up   Depression   Anxiety   ADHD    JAI BEAR presents today for follow-up of worsening depression and anxiety over care of dying and now deceased husband.    seen Jul 30, 2018.  No med changes were made at that visit.  Counseling helped a lot and she feels much better at this time.  Much more functional and productive.  More outgoing.  11/2018 appt noted following without med changes: After 5 months depression lifted after [name] died.  No deaths from Covid.   Mood is a lot better.  Sleep is OK.  2 one mile walks daily.  Had party this weekend.    09/09/2020 appointment with following noted: Patient has had a lot of serious medical problems accounting for the long  delay since the last appointment.  Hx pancreatic CA by her report.  Afib is better. No Covid.  Travelled.   Mentally pretty decent.  Hard stuck at [facility] for 2 years.  Went to visit friend in [place] and felt much better and so will travel more.  Lonesome bc no good friends. Can't move bc feels tied to [facility] financially. Feels better and cognitively better with stimulant. Plan no med changes  05/05/21 appt noted: [facility] and tedious place.  Doesn't like small room.  Looking for better room there. Does have friends there with whom she can converse. Ups and downs in mood.  Got attention from someone who said he loved her and then he changed his mind. Losing weight intentionally.  Not eating meat.  Eggs are OK.   No SE. No naps. No family around but does have family.  But inherited a good amount of money but being restricted on what she can spend.  Reduced freedom. Plan: Would prefer it be changed to Adderall XR 10 mg AM to extend duration and smooth response and reduce SE risk.  08/20/2021 appt noted: Switch to Adderall XR 10 mg every morning, continues Aricept 10 mg daily, duloxetine 60 mg twice daily, gabapentin 200 mg twice daily, lorazepam 0.5 mg 3 times daily as needed anxiety. Trouble keeping up with dates.  In memory care now and not crazy about it.  Only person there who can talk.  No conversation, so isolated.  Trying to move to [facility] assisted living.  Has health care [facility] [facility].   Upset house taken apart without her permission and took so much out of her with a sense of loss including antiques from [employer].  Set me back.  But functioning better now.  Financially is fine.   Memory test done end of December 2022 with score of 26/30 which was an improvement of 6 points. No local family or people to help her with the typed application to [facility]. Patient reports stable mood and denies depressed or irritable moods.  Patient denies any recent  difficulty with anxiety.  Patient denies difficulty with sleep initiation or maintenance. Denies appetite disturbance.  Patient reports that energy and motivation have been good.  Patient denies any worsening memory or concentration.  Patient denies any suicidal ideation.  11/18/21 appt noted: Likes to get out of the room more than she is currently.  Trouble reading so watching a lot of TV.   Has moved to [facility] and likes that much better.  PT comes there.  Really likes it there.  Contented.  Satisfied with meds.Sleeping well.  Tries to socialize and walk.  Satisfied with meds. Continues Wellbutrin SR 100 mg BID, and others : Adderall XR 10 mg every morning, continues Aricept 10 mg daily, duloxetine 60 mg twice daily, gabapentin 200 mg twice daily, lorazepam 0.5 mg 3 times daily as needed anxiety. No SE with meds.   HA frequency varies. Plan: Plan: Continues Wellbutrin SR 100 mg BID, and others : Adderall XR 10 mg every morning, continues Aricept 10 mg daily, duloxetine 60 mg twice daily, gabapentin 200 mg twice daily, lorazepam 0.5 mg 3 times daily as needed anxiety.  04/28/22 appt needed: Working on getting meds .  They are administered to her correctly.  Not worrying about it now.  No problems with vision.   Pretty much ok most of the time.  Down bc good friend being moved to [facility].  Reaching out to support her.  Seen her go down for years.  Friends for 20 years.   Satisfied with meds.  No SE problems. Living at [facility].  She exercises a lot.  Nice facility.  Like being at home.  Walks daily and resistance machines.  Good medical support there as needed.  Depression as well controlled as ever.   Taking lorazepam daily, but not sure when.  Not 100% sure about her meds but thinks she's compliant with everything. Someone to take her shopping weekly.  Goes to church.    Hospice counselor Earnest Bailey was excellent and very helpful.  Past Psychiatric  Medication Trials: Pramipexole Duloxetine 120 Wellbutrin SR 100 mg BID Adderall XR 10 or dexedrine. Donepezil 10  gabapentin  Review of Systems:  Review of Systems  Constitutional:  Positive for unexpected weight change.  Neurological:  Positive for headaches. Negative for tremors.  Psychiatric/Behavioral:  Negative for agitation, behavioral problems, confusion, decreased concentration, dysphoric mood, hallucinations, self-injury, sleep disturbance and suicidal ideas. The patient is nervous/anxious. The patient is not hyperactive.     Medications: I have reviewed the patient's current medications.  Current Outpatient Medications  Medication Sig Dispense Refill   acetaminophen (TYLENOL) 500 MG tablet Take 1,000 mg by mouth every 6 (six) hours as needed for mild pain or moderate pain.     amphetamine-dextroamphetamine (ADDERALL XR) 10 MG 24 hr capsule Take 1 capsule (  10 mg total) by mouth daily. 90 capsule 0   apixaban (ELIQUIS) 5 MG TABS tablet Take 1 tablet (5 mg total) by mouth in the morning and at bedtime. 60 tablet 0   buPROPion ER (WELLBUTRIN SR) 100 MG 12 hr tablet Take 100 mg by mouth 2 (two) times daily.     donepezil (ARICEPT) 10 MG tablet Take 10 mg by mouth daily.     DULoxetine (CYMBALTA) 60 MG capsule TAKE 1 CAPSULE BY MOUTH 2 TIMES DAILY. 180 capsule 1   flecainide (TAMBOCOR) 50 MG tablet Take 100 mg by mouth 2 (two) times daily.     gabapentin (NEURONTIN) 100 MG capsule Take 1 capsule (100 mg total) by mouth 3 (three) times daily. For pill pack and delivery 270 capsule 0   levothyroxine (SYNTHROID) 25 MCG tablet TAKE 1 TABLET (25 MCG TOTAL) BY MOUTH DAILY BEFORE BREAKFAST. 30 MINUTES BEFORE FOOD 90 tablet 3   LORazepam (ATIVAN) 0.5 MG tablet TAKE 1 TABLET (0.5 MG TOTAL) BY MOUTH EVERY 8 (EIGHT) HOURS. 90 tablet 5   Multiple Vitamin (MULTIVITAMIN WITH MINERALS) TABS tablet Take 1 tablet by mouth daily.     ondansetron (ZOFRAN) 4 MG tablet Take 4 mg by mouth every 8 (eight)  hours as needed for nausea.     pravastatin (PRAVACHOL) 10 MG tablet Take 1 tablet (10 mg total) by mouth at bedtime. Take a whole pill for pill pack and delivery 90 tablet 3   timolol (TIMOPTIC) 0.5 % ophthalmic solution Place 1 drop into the left eye at bedtime.      traZODone (DESYREL) 50 MG tablet Take 0.5 tablets (25 mg total) by mouth at bedtime as needed for sleep. 30 tablet 3   No current facility-administered medications for this visit.    Medication Side Effects: None  Allergies:  Allergies  Allergen Reactions   Darvon Other (See Comments)    Severe panic attacks   Propoxyphene Other (See Comments)    GI Upset Severe panic attacks GI Upset   Propoxyphene N-Acetaminophen Other (See Comments)    Severe panic attacks   Levofloxacin Anxiety and Other (See Comments)    Causes panic attacks.    Past Medical History:  Diagnosis Date   Actinic keratosis 12/20/2012   left nasal tip   Anxiety    controlled with meds   Anxiety    Arrhythmia    Atrial fibrillation ([name])    paroxysmal, failed medical therapy with flecainide,  NSVT with tikosyn   Basal cell carcinoma 12/20/2012   left proximal mandible   Bruises easily    CVA (cerebral vascular accident) ([place]) 12/2007   Depression    medically controlled    Glaucoma    also with macular holes AE Dr. Thomasene Ripple   History of loop recorder    Hypothyroidism    Low blood pressure    no falls, but if gets up to fast   RLS (restless legs syndrome)    Stroke ([place]) 7 years ago   Lasting effects on balance, different personality.   Thyroid disease    Tremor    Ventricular tachycardia ([place])    pt told at [facility] that she had RVOT VT    Family History  Problem Relation Age of Onset   Multiple sclerosis Father    Breast cancer Neg Hx     Social History   Socioeconomic History   Marital status: Widowed    Spouse name: Not on file   Number of children: 0  Years of education: Not on file   Highest education level: Not  on file  Occupational History   Occupation: Retired    Fish farm manager: RETIRED  Tobacco Use   Smoking status: Never   Smokeless tobacco: Never  Vaping Use   Vaping Use: Never used  Substance and Sexual Activity   Alcohol use: Yes    Alcohol/week: 2.0 standard drinks of alcohol    Types: 2 Standard drinks or equivalent per week    Comment: regular   Drug use: No   Sexual activity: Yes  Other Topics Concern   Not on file  Social History Narrative   Lives in [place] with her husband.  No children 2 step kids.  Former Psychologist, prison and probation services   Enjoys exercise- water classes, yoga Editor, commissioning.    Widowed 2019    Only child and mother was only child   Used to sail with husband      Has a living will-    Would desire CPR   Would not want prolonged life support if futile.      Has 1 cat lives alone no kids but see above    Social Determinants of Health   Financial Resource Strain: Low Risk  (08/09/2019)   Overall Financial Resource Strain (CARDIA)    Difficulty of Paying Living Expenses: Not hard at all  Food Insecurity: No Food Insecurity (08/09/2019)   Hunger Vital Sign    Worried About Running Out of Food in the Last Year: Never true    Ran Out of Food in the Last Year: Never true  Transportation Needs: No Transportation Needs (08/09/2019)   PRAPARE - Hydrologist (Medical): No    Lack of Transportation (Non-Medical): No  Physical Activity: Sufficiently Active (08/09/2019)   Exercise Vital Sign    Days of Exercise per Week: 7 days    Minutes of Exercise per Session: 60 min  Stress: No Stress Concern Present (08/09/2019)   [place]    Feeling of Stress : Only a little  Social Connections: Unknown (08/09/2019)   Social Connection and Isolation Panel [NHANES]    Frequency of Communication with Friends and Family: Three times a week    Frequency of Social Gatherings with Friends and Family:  Once a week    Attends Religious Services: Not on file    Active Member of Clubs or Organizations: Not on file    Attends Archivist Meetings: Not on file    Marital Status: Widowed  Intimate Partner Violence: Not At Risk (08/09/2019)   Humiliation, Afraid, Rape, and Kick questionnaire    Fear of Current or Ex-Partner: No    Emotionally Abused: No    Physically Abused: No    Sexually Abused: No    Past Medical History, Surgical history, Social history, and Family history were reviewed and updated as appropriate.   Please see review of systems for further details on the patient's review from today.   Objective:   Physical Exam:  There were no vitals taken for this visit.  Physical Exam Constitutional:      General: She is not in acute distress.    Appearance: She is well-developed.  Musculoskeletal:        General: No deformity.  Neurological:     Mental Status: She is alert and oriented to person, place, and time.     Motor: No tremor.     Coordination: Coordination normal.  Gait: Gait normal.  Psychiatric:        Attention and Perception: She is attentive.        Mood and Affect: Mood is not anxious or depressed. Affect is not labile, angry, tearful or inappropriate.        Speech: Speech normal.        Behavior: Behavior normal. Behavior is not slowed.        Thought Content: Thought content normal. Thought content is not delusional. Thought content does not include homicidal or suicidal ideation. Thought content does not include suicidal plan.        Cognition and Memory: She exhibits impaired recent memory.        Judgment: Judgment normal.     Comments: Insight and judgment good.   Pleasant.   No auditory or visual hallucinations. No delusions.  No depression    Lab Review:     Component Value Date/Time   NA 138 12/17/2020 0620   NA 139 08/20/2017 1619   NA 136 11/07/2014 1539   K 3.7 12/17/2020 0620   K 4.3 11/07/2014 1539   CL 105 12/17/2020  0620   CL 100 (L) 11/07/2014 1539   CO2 26 12/17/2020 0620   CO2 28 11/07/2014 1539   GLUCOSE 105 (H) 12/17/2020 0620   GLUCOSE 107 (H) 11/07/2014 1539   BUN 22 12/17/2020 0620   BUN 17 08/20/2017 1619   BUN 22 (H) 11/07/2014 1539   CREATININE 0.56 12/17/2020 0620   CREATININE 0.60 11/07/2014 1539   CALCIUM 8.3 (L) 12/17/2020 0620   CALCIUM 9.2 11/07/2014 1539   PROT 6.4 (L) 12/16/2020 1519   ALBUMIN 3.7 12/16/2020 1519   AST 31 12/16/2020 1519   ALT 26 12/16/2020 1519   ALKPHOS 52 12/16/2020 1519   BILITOT 0.7 12/16/2020 1519   GFRNONAA >60 12/17/2020 0620   GFRNONAA >60 11/07/2014 1539   GFRAA >60 02/17/2020 0702   GFRAA >60 11/07/2014 1539       Component Value Date/Time   WBC 5.6 12/17/2020 0620   RBC 3.67 (L) 12/17/2020 0620   HGB 11.1 (L) 12/17/2020 0620   HGB 13.0 08/20/2017 1619   HCT 33.4 (L) 12/17/2020 0620   HCT 38.1 08/20/2017 1619   PLT 222 12/17/2020 0620   PLT 286 08/20/2017 1619   MCV 91.0 12/17/2020 0620   MCV 89 08/20/2017 1619   MCV 92 11/07/2014 1539   MCH 30.2 12/17/2020 0620   MCHC 33.2 12/17/2020 0620   RDW 14.2 12/17/2020 0620   RDW 14.1 08/20/2017 1619   RDW 13.9 11/07/2014 1539   LYMPHSABS 1.7 12/16/2020 1519   MONOABS 0.9 12/16/2020 1519   EOSABS 0.1 12/16/2020 1519   BASOSABS 0.1 12/16/2020 1519    No results found for: "POCLITH", "LITHIUM"   No results found for: "PHENYTOIN", "PHENOBARB", "VALPROATE", "CBMZ"   .res Assessment: Plan:    Major depressive disorder, recurrent episode, moderate ([name])  Generalized anxiety disorder  Attention deficit hyperactivity disorder (ADHD), other type  Mild cognitive impairment acquired after stroke.    Greater than 50% of 30 non face to face time with patient was spent on counseling and coordination of care. We discussed the following. Reviewed med list from [place], NP-C added Wellbutrin SR 100 BID for mood.  Her note reviewed from 08/06/2021. Agree with that trial.  Looks succesful Neurology note 07/02/2021 reviewed. [facility] DT strokes. Pt does not have dementia and I would supported her transition to regular assisted living and out  of memory care and this has been successful and she has moved out of memory care into [facility] and likes it much better.  No med changes.  Plan: Continues Wellbutrin SR 100 mg BID, and others : Adderall XR 10 mg every morning, continues Aricept 10 mg daily, duloxetine 60 mg twice daily, gabapentin 200 mg twice daily, lorazepam 0.5 mg 3 times daily as needed anxiety.  FU 4 mos  Lynder Parents, MD, DFAPA   Please see After Visit Summary for patient specific instructions.  No future appointments.   No orders of the defined types were placed in this encounter.      -------------------------------

## 2022-05-04 DIAGNOSIS — Z9181 History of falling: Secondary | ICD-10-CM | POA: Diagnosis not present

## 2022-05-04 DIAGNOSIS — S82201D Unspecified fracture of shaft of right tibia, subsequent encounter for closed fracture with routine healing: Secondary | ICD-10-CM | POA: Diagnosis not present

## 2022-05-04 DIAGNOSIS — R2689 Other abnormalities of gait and mobility: Secondary | ICD-10-CM | POA: Diagnosis not present

## 2022-06-03 DIAGNOSIS — S82201D Unspecified fracture of shaft of right tibia, subsequent encounter for closed fracture with routine healing: Secondary | ICD-10-CM | POA: Diagnosis not present

## 2022-06-03 DIAGNOSIS — Z9181 History of falling: Secondary | ICD-10-CM | POA: Diagnosis not present

## 2022-06-03 DIAGNOSIS — R2689 Other abnormalities of gait and mobility: Secondary | ICD-10-CM | POA: Diagnosis not present

## 2022-06-10 ENCOUNTER — Encounter: Payer: Self-pay | Admitting: Student

## 2022-06-10 ENCOUNTER — Non-Acute Institutional Stay (SKILLED_NURSING_FACILITY): Payer: PPO | Admitting: Student

## 2022-06-10 DIAGNOSIS — I669 Occlusion and stenosis of unspecified cerebral artery: Secondary | ICD-10-CM

## 2022-06-10 DIAGNOSIS — R413 Other amnesia: Secondary | ICD-10-CM

## 2022-06-10 DIAGNOSIS — E039 Hypothyroidism, unspecified: Secondary | ICD-10-CM

## 2022-06-10 DIAGNOSIS — I4819 Other persistent atrial fibrillation: Secondary | ICD-10-CM

## 2022-06-10 DIAGNOSIS — E782 Mixed hyperlipidemia: Secondary | ICD-10-CM

## 2022-06-10 DIAGNOSIS — F341 Dysthymic disorder: Secondary | ICD-10-CM | POA: Diagnosis not present

## 2022-06-10 DIAGNOSIS — I63412 Cerebral infarction due to embolism of left middle cerebral artery: Secondary | ICD-10-CM

## 2022-06-10 DIAGNOSIS — F39 Unspecified mood [affective] disorder: Secondary | ICD-10-CM | POA: Diagnosis not present

## 2022-06-10 DIAGNOSIS — Z79899 Other long term (current) drug therapy: Secondary | ICD-10-CM | POA: Diagnosis not present

## 2022-06-10 DIAGNOSIS — M8000XP Age-related osteoporosis with current pathological fracture, unspecified site, subsequent encounter for fracture with malunion: Secondary | ICD-10-CM | POA: Diagnosis not present

## 2022-06-10 NOTE — Progress Notes (Signed)
Location:   [facility] Room Number: 217-S Place of Service:  ALF [phone]) Provider:  Dr.Kataryna Mcquilkin     Patient Care Team: Monica Shorter, MD as PCP - General (Family Medicine) Sherran Needs, NP as Nurse Practitioner (Nurse Practitioner) Thompson Grayer, MD as Consulting Physician (Cardiology) Roseanne Kaufman, MD as Consulting Physician (Orthopedic Surgery) Josefine Class, MD as Referring Physician (Gastroenterology) Estill Cotta, MD as Consulting Physician (Ophthalmology) Lauree Chandler, NP as Nurse Practitioner (Geriatric Medicine)  Extended Emergency Contact Information Primary Emergency Contact: MonicaBruce Address: [place] [address] [place] Phone: [phone] Relation: Relative Secondary Emergency Contact: [place] Address: [address], [place] [address] [place] Work Phone: [phone] Relation: Other  Code Status:  FULL CODE Goals of care: Advanced Directive information    06/10/2022   11:50 AM  Advanced Directives  Does Patient Have a Medical Advance Directive? Yes  Type of Paramedic of [place];Living will  Does patient want to make changes to medical advance directive? No - Patient declined  Copy of [place] in Chart? Yes - validated most recent copy scanned in chart (See row information)     Chief Complaint  Patient presents with   Medical Management of Chronic Issues    Routine Visit.    Health Maintenance    Discuss the need for AWV.   Immunizations    Discuss the need for [employer].    HPI:  Pt is a 76 y.o. female seen today for medical management of chronic diseases.    She is oriented to self and location.   Originally from [place] and has been in [place] for 20 year, [place] for 10 years. No family nearby. She has enjoyed living here. She was with her  husband initially. He passed away, but she likes it here.   She is a stroke survivor. Things have gone wrong, but nothing that is continuous.   She gets her meds from the nurses. Does walking for exercise. Works with a Clinical research associate at times. She is independent in this place. No recent falls.   Her mood - my Link Snuffer is that th emiddle is fine, because too happy and she sinks. She states she is comfortable right now.   She enjoys reading, walking, exercise classes here   We are at [facility], unclear on the building. November 2023 (looked at a calendar).   She used to do Medical sales representative, did IT consultant. Her favorite place was [place]. She married at 20, no children.  Prolia stopped in March of this year, unclear reason why.  Past Medical History:  Diagnosis Date   Actinic keratosis 12/20/2012   left nasal tip   Anxiety    controlled with meds   Anxiety    Arrhythmia    Atrial fibrillation ([name])    paroxysmal, failed medical therapy with flecainide,  NSVT with tikosyn   Basal cell carcinoma 12/20/2012   left proximal mandible   Bruises easily    CVA (cerebral vascular accident) ([place]) 12/2007   Depression    medically controlled    Glaucoma    also with macular holes AE Dr. Thomasene Ripple   History of loop recorder    Hypothyroidism    Low blood pressure    no falls, but if gets  up to fast   RLS (restless legs syndrome)    Stroke ([name]) 7 years ago   Lasting effects on balance, different personality.   Thyroid disease    Tremor    Ventricular tachycardia ([place])    pt told at [facility] that she had RVOT VT   Past Surgical History:  Procedure Laterality Date   ATRIAL FIBRILLATION ABLATION  10/18/12   PVI by Dr Rayann Heman   ATRIAL FIBRILLATION ABLATION N/A 10/18/2012   Procedure: ATRIAL FIBRILLATION ABLATION;  Surgeon: Thompson Grayer, MD;  Location: [facility] CATH LAB;  Service: Cardiovascular;  Laterality: N/A;   ATRIAL FIBRILLATION ABLATION N/A 04/18/2019   Procedure: ATRIAL  FIBRILLATION ABLATION;  Surgeon: Thompson Grayer, MD;  Location: [facility] CV LAB;  Service: Cardiovascular;  Laterality: N/A;   EUS N/A 10/19/2019   Procedure: FULL UPPER ENDOSCOPIC ULTRASOUND (EUS) RADIAL;  Surgeon: Holly Bodily, MD;  Location: [facility] ENDOSCOPY;  Service: Gastroenterology;  Laterality: N/A;   EYE SURGERY     on L/R eye, macular hole    FRACTURE SURGERY     implantable loop recorder placement  07/13/2019   MDT Reveal LINQ1 [facility] [id number]) implanted in office by Dr Rayann Heman for afib management post ablation   OPEN REDUCTION INTERNAL FIXATION (ORIF) DISTAL RADIAL FRACTURE Right 11/15/2014   Procedure: OPEN REDUCTION INTERNAL FIXATION (ORIF) RIGHT DISTAL RADIAL FRACTURE WITH ALLOGRAFT BONE GRAFT;  Surgeon: Roseanne Kaufman, MD;  Location: [facility];  Service: Orthopedics;  Laterality: Right;   PANCREATECTOMY N/A 02/13/2020   Procedure: LAPAROSCOPIC DISTAL PANCREATECTOMY;  Surgeon: Stark Klein, MD;  Location: [facility];  Service: General;  Laterality: N/A;   TEE WITHOUT CARDIOVERSION N/A 10/18/2012   Procedure: TRANSESOPHAGEAL ECHOCARDIOGRAM (TEE);  Surgeon: Lelon Perla, MD;  Location: [facility] ENDOSCOPY;  Service: Cardiovascular;  Laterality: N/A;  Pre-Ablation at 12pm   TONSILLECTOMY     VAGINAL HYSTERECTOMY      Allergies  Allergen Reactions   Darvon Other (See Comments)    Severe panic attacks   Propoxyphene Other (See Comments)    GI Upset Severe panic attacks GI Upset   Propoxyphene N-Acetaminophen Other (See Comments)    Severe panic attacks   Levofloxacin Anxiety and Other (See Comments)    Causes panic attacks.    Allergies as of 06/10/2022       Reactions   Darvon Other (See Comments)   Severe panic attacks   Propoxyphene Other (See Comments)   GI Upset Severe panic attacks GI Upset   Propoxyphene N-acetaminophen Other (See Comments)   Severe panic attacks   Levofloxacin Anxiety, Other (See Comments)   Causes panic attacks.         Medication List        Accurate as of June 10, 2022 11:51 AM. If you have any questions, ask your nurse or doctor.          STOP taking these medications    amphetamine-dextroamphetamine 10 MG 24 hr capsule Commonly known as: ADDERALL XR Stopped by: [place] Shorter, MD       TAKE these medications    acetaminophen 325 MG tablet Commonly known as: TYLENOL Take 650 mg by mouth every 4 (four) hours as needed.   acetaminophen 500 MG tablet Commonly known as: TYLENOL Take 1,000 mg by mouth every 6 (six) hours as needed for mild pain or moderate pain.   alum & mag hydroxide-simeth 200-200-20 MG/5 Susp Commonly known as: MAALOX/MYLANTA Apply topically every 4 (four) hours as needed. 2 tablespoons  BENADRYL ALLERGY PO Take by mouth as needed.   buPROPion ER 100 MG 12 hr tablet Commonly known as: WELLBUTRIN SR Take 100 mg by mouth 2 (two) times daily.   cetirizine 10 MG tablet Commonly known as: ZYRTEC Take 10 mg by mouth as needed for allergies.   Cranberry 425 MG Caps Take 2 capsules by mouth at bedtime.   Debrox 6.5 % OTIC solution Generic drug: carbamide peroxide Place 5 drops into both ears as needed.   donepezil 10 MG tablet Commonly known as: ARICEPT Take 10 mg by mouth daily.   DULoxetine 60 MG capsule Commonly known as: CYMBALTA TAKE 1 CAPSULE BY MOUTH 2 TIMES DAILY.   Eliquis 5 MG Tabs tablet Generic drug: apixaban Take 1 tablet (5 mg total) by mouth in the morning and at bedtime.   flecainide 50 MG tablet Commonly known as: TAMBOCOR Take 100 mg by mouth 2 (two) times daily.   gabapentin 100 MG capsule Commonly known as: NEURONTIN Take 1 capsule (100 mg total) by mouth 3 (three) times daily. For pill pack and delivery   Glucose 15 GM/32ML Gel Take by mouth as needed.   Kaopectate 262 MG/15ML suspension Generic drug: bismuth subsalicylate Take 10 mLs by mouth as needed.   Levothyroxine Sodium 25 MCG Caps Take 1 capsule by mouth at  bedtime. What changed: Another medication with the same name was removed. Continue taking this medication, and follow the directions you see here. Changed by: [place] Shorter, MD   LORazepam 0.5 MG tablet Commonly known as: ATIVAN Take 0.5 mg by mouth 3 (three) times daily. What changed: Another medication with the same name was removed. Continue taking this medication, and follow the directions you see here. Changed by: [place] Shorter, MD   Milk of Magnesia 400 MG/5ML suspension Generic drug: magnesium hydroxide Take by mouth as needed for mild constipation. 2 tablespoons   multivitamin-iron-minerals-folic acid Tabs tablet Take 1 tablet by mouth daily.   nystatin powder Commonly known as: MYCOSTATIN/NYSTOP Apply 1 Application topically 2 (two) times daily.   ondansetron 4 MG tablet Commonly known as: ZOFRAN Take 4 mg by mouth as needed for nausea.   polyethylene glycol 17 g packet Commonly known as: MIRALAX / GLYCOLAX Take 17 g by mouth as needed for mild constipation, moderate constipation or severe constipation.   pravastatin 10 MG tablet Commonly known as: PRAVACHOL Take 1 tablet (10 mg total) by mouth at bedtime. Take a whole pill for pill pack and delivery   PSYLLIUM PO Take 1 Wafer by mouth as needed.   SIMETHICONE PO Take 1 tablet by mouth as needed.   timolol 0.5 % ophthalmic solution Commonly known as: TIMOPTIC Place 1 drop into the left eye at bedtime.   traMADol 50 MG tablet Commonly known as: ULTRAM Take 50 mg by mouth every 12 (twelve) hours as needed for moderate pain or severe pain.   traZODone 50 MG tablet Commonly known as: DESYREL Take 0.5 tablets (25 mg total) by mouth at bedtime as needed for sleep.   triamcinolone cream 0.1 % Commonly known as: KENALOG Apply 1 Application topically as needed.   Tussin CF 5-10-100 MG/5ML Liqd Generic drug: Phenylephrine-DM-GG Take by mouth every 4 (four) hours as needed. 2 tablespoons.        Review  of Systems  All other systems reviewed and are negative.   Immunization History  Administered Date(s) Administered   Fluad Quad(high Dose 65+) 05/10/2019, 05/22/2020   Influenza Split 05/21/2011, 05/31/2012   Influenza  Whole 06/02/2010   Influenza, High Dose Seasonal PF 05/24/2014, 05/19/2022   Influenza,inj,Quad PF,6+ Mos 04/04/2015, 05/06/2016, 05/13/2017, 05/10/2018   Moderna SARS-COV2 Booster Vaccination 12/09/2020   Moderna Sars-Covid-2 Vaccination 08/17/2019, 09/14/2019, 06/18/2020   Pneumococcal Conjugate-13 10/02/2013   Pneumococcal Polysaccharide-23 04/04/2015   Td 04/18/2008   Tdap 10/30/2015   Zoster Recombinat (Shingrix) 01/27/2018, 03/13/2019   Zoster, Live 04/18/2008   Pertinent  Health Maintenance Due  Topic Date Due   MAMMOGRAM  08/19/2023   COLONOSCOPY (Pts 45-31yr Insurance coverage will need to be confirmed)  03/06/2024   INFLUENZA VACCINE  Completed   DEXA SCAN  Completed      12/18/2020    9:07 PM 12/19/2020    7:12 AM 12/26/2020   11:44 AM 02/20/2021    1:17 PM 10/18/2021    4:29 PM  Fall Risk  Falls in the past year?   0 0   Was there an injury with Fall?   0 0   Fall Risk Category Calculator   0 0   Fall Risk Category   Low Low   Patient Fall Risk Level High fall risk High fall risk Low fall risk Low fall risk Low fall risk  Fall risk Follow up   Falls evaluation completed Falls evaluation completed    Functional Status Survey:    Vitals:   06/10/22 1120  BP: 113/73  Pulse: (!) 56  Resp: (!) 23  Temp: 98 F (36.7 C)  SpO2: 91%  Weight: 159 lb 12.8 oz (72.5 kg)  Height: '5\' 9"'$  (1.753 m)   Body mass index is 23.6 kg/m. Physical Exam Vitals reviewed.  Constitutional:      Appearance: Normal appearance.  Cardiovascular:     Rate and Rhythm: Normal rate and regular rhythm.     Pulses: Normal pulses.     Heart sounds: Normal heart sounds.  Pulmonary:     Effort: Pulmonary effort is normal.  Abdominal:     General: Abdomen is flat.  Bowel sounds are normal.     Palpations: Abdomen is soft.  Musculoskeletal:        General: No swelling or tenderness.  Skin:    General: Skin is warm and dry.     Capillary Refill: Capillary refill takes 2 to 3 seconds.  Neurological:     General: No focal deficit present.     Mental Status: She is alert. Mental status is at baseline.  Psychiatric:        Mood and Affect: Mood normal.     Labs reviewed: No results for input(s): "NA", "K", "CL", "CO2", "GLUCOSE", "BUN", "CREATININE", "CALCIUM", "MG", "PHOS" in the last 8760 hours. No results for input(s): "AST", "ALT", "ALKPHOS", "BILITOT", "PROT", "ALBUMIN" in the last 8760 hours. No results for input(s): "WBC", "NEUTROABS", "HGB", "HCT", "MCV", "PLT" in the last 8760 hours. Lab Results  Component Value Date   TSH 2.07 07/02/2020   Lab Results  Component Value Date   HGBA1C 5.6 02/14/2020   Lab Results  Component Value Date   CHOL 205 (H) 07/02/2020   HDL 96.20 07/02/2020   LDLCALC 96 07/02/2020   LDLDIRECT 123.5 09/18/2013   TRIG 63.0 07/02/2020   CHOLHDL 2 07/02/2020    Significant Diagnostic Results in last 30 days:  No results found.  Assessment/Plan 1. Cerebrovascular accident (CVA) due to embolism of left middle cerebral artery ([name]) 2. Apoplexy, embolic 3. Memory loss Patient is in Assisted living based on memory changes requiring support with medications. No recent falls. Weight  stable. Concern for numerous medications contributing to memory. Will titrate medications as tolerated. Discontinue gabapentin. Continue donepezil 10 mg BID.   4. Persistent atrial fibrillation ([name]) Rate controlled without medication, continue eliquis '5mg'$ . Will follow up labs for need of decreasing dose based on kidney function.   5. Acquired hypothyroidism TSH pending, will continue Levothyroxine 25 mcg daily.   6. Osteoporosis with current pathological fracture with malunion, unspecified osteoporosis type, subsequent  encounter Most recent DEXA showed osteoporosis 08/2021. Concern that patient has been 8 months without treatment at this time. Discussed importance of continuing this medication. Will plan for labs to assess renal function and determine which medication to resume. Of note, patient was on Prolia before and tolerated fine and nursing can provide medication within the facility. She is open to trying other medications. Will need Endocrine referral for zoledronic acid, open to weekly pill if appropriate.   7. Mood disorder ([place])  8. Dysthymic disorder Patient has longstanding issues with mood -- depression and anxiety. She has had numerous medications. Continue Wellbutrin 100 m BID. Cymbalta 60 mg daily, ativan 0.5 mg q8 hours, Trazodone 25 mg nightly. Discussed concern for the combination of medications She doesn't remember taking the ativan. She has been happy with the effects of trazodone for some time.   9. Mixed hyperlipidemia Continue Pravastatin 10 mg daily  10. Polypharmacy Discussed concern for patient's medication interactions. She is followed by psychiatry, however, I do not see a recent visit. Will continue to work on discontinuing and minimizing high risk medications and combinations. Discontinued gabapentin today. Next will likely discontinue tramadol and work on tapering ativan dosing.    Family/ staff Communication: Nursing  Labs/tests ordered:  BMP, CBC, TSH  Tomasa Rand, MD, [facility] [phone]

## 2022-06-11 DIAGNOSIS — E039 Hypothyroidism, unspecified: Secondary | ICD-10-CM | POA: Diagnosis not present

## 2022-06-11 DIAGNOSIS — M810 Age-related osteoporosis without current pathological fracture: Secondary | ICD-10-CM | POA: Diagnosis not present

## 2022-06-11 DIAGNOSIS — I482 Chronic atrial fibrillation, unspecified: Secondary | ICD-10-CM | POA: Diagnosis not present

## 2022-06-12 LAB — LIPID PANEL
Cholesterol: 195 (ref 0–200)
HDL: 107 — AB (ref 35–70)
LDL Cholesterol: 75
Triglycerides: 52 (ref 40–160)

## 2022-06-12 LAB — HEPATIC FUNCTION PANEL
ALT: 17 U/L (ref 7–35)
AST: 12 — AB (ref 13–35)
Alkaline Phosphatase: 70 (ref 25–125)
Bilirubin, Total: 0.7

## 2022-06-12 LAB — BASIC METABOLIC PANEL
BUN: 15 (ref 4–21)
CO2: 30 — AB (ref 13–22)
Chloride: 100 (ref 99–108)
Creatinine: 0.6 (ref 0.5–1.1)
Glucose: 89
Potassium: 4.4 mEq/L (ref 3.5–5.1)
Sodium: 135 — AB (ref 137–147)

## 2022-06-12 LAB — CBC AND DIFFERENTIAL
HCT: 39 (ref 36–46)
Hemoglobin: 12.8 (ref 12.0–16.0)
Neutrophils Absolute: 1598
Platelets: 222 10*3/uL (ref 150–400)
WBC: 3.6

## 2022-06-12 LAB — COMPREHENSIVE METABOLIC PANEL
Albumin: 4.1 (ref 3.5–5.0)
Calcium: 9.1 (ref 8.7–10.7)
Globulin: 2.5
eGFR: 93

## 2022-06-12 LAB — CBC: RBC: 4.24 (ref 3.87–5.11)

## 2022-07-01 DIAGNOSIS — S92355D Nondisplaced fracture of fifth metatarsal bone, left foot, subsequent encounter for fracture with routine healing: Secondary | ICD-10-CM | POA: Diagnosis not present

## 2022-07-01 DIAGNOSIS — R278 Other lack of coordination: Secondary | ICD-10-CM | POA: Diagnosis not present

## 2022-07-01 DIAGNOSIS — M810 Age-related osteoporosis without current pathological fracture: Secondary | ICD-10-CM | POA: Diagnosis not present

## 2022-07-06 DIAGNOSIS — S82201D Unspecified fracture of shaft of right tibia, subsequent encounter for closed fracture with routine healing: Secondary | ICD-10-CM | POA: Diagnosis not present

## 2022-07-06 DIAGNOSIS — Z9181 History of falling: Secondary | ICD-10-CM | POA: Diagnosis not present

## 2022-07-06 DIAGNOSIS — R2689 Other abnormalities of gait and mobility: Secondary | ICD-10-CM | POA: Diagnosis not present

## 2022-07-15 DIAGNOSIS — R251 Tremor, unspecified: Secondary | ICD-10-CM | POA: Diagnosis not present

## 2022-07-15 DIAGNOSIS — F339 Major depressive disorder, recurrent, unspecified: Secondary | ICD-10-CM | POA: Diagnosis not present

## 2022-07-15 DIAGNOSIS — Z8673 Personal history of transient ischemic attack (TIA), and cerebral infarction without residual deficits: Secondary | ICD-10-CM | POA: Diagnosis not present

## 2022-07-15 DIAGNOSIS — R413 Other amnesia: Secondary | ICD-10-CM | POA: Diagnosis not present

## 2022-07-20 DIAGNOSIS — B351 Tinea unguium: Secondary | ICD-10-CM | POA: Diagnosis not present

## 2022-07-20 DIAGNOSIS — I7091 Generalized atherosclerosis: Secondary | ICD-10-CM | POA: Diagnosis not present

## 2022-08-05 DIAGNOSIS — Z9181 History of falling: Secondary | ICD-10-CM | POA: Diagnosis not present

## 2022-08-05 DIAGNOSIS — R2689 Other abnormalities of gait and mobility: Secondary | ICD-10-CM | POA: Diagnosis not present

## 2022-08-05 DIAGNOSIS — S82201D Unspecified fracture of shaft of right tibia, subsequent encounter for closed fracture with routine healing: Secondary | ICD-10-CM | POA: Diagnosis not present

## 2022-08-06 ENCOUNTER — Telehealth: Payer: Self-pay | Admitting: Internal Medicine

## 2022-08-06 NOTE — Telephone Encounter (Signed)
New Message:    Patient would like for the nurse to call her please. She says she have been having headaches. She said this having been going on for over a year,

## 2022-08-06 NOTE — Telephone Encounter (Signed)
I called and spoke with the patient. She advised immediately that she had called the wrong office. She states she needed to contact her "head" doctor and not her "heart" doctor. She advised she does not need [place] for anything further at this time.   She was appreciative of the call back.

## 2022-09-10 ENCOUNTER — Encounter (HOSPITAL_COMMUNITY): Payer: Self-pay | Admitting: *Deleted

## 2022-09-11 DIAGNOSIS — S82201D Unspecified fracture of shaft of right tibia, subsequent encounter for closed fracture with routine healing: Secondary | ICD-10-CM | POA: Diagnosis not present

## 2022-09-11 DIAGNOSIS — R2689 Other abnormalities of gait and mobility: Secondary | ICD-10-CM | POA: Diagnosis not present

## 2022-09-11 DIAGNOSIS — Z9181 History of falling: Secondary | ICD-10-CM | POA: Diagnosis not present

## 2022-09-17 ENCOUNTER — Encounter: Payer: Self-pay | Admitting: Nurse Practitioner

## 2022-09-17 ENCOUNTER — Non-Acute Institutional Stay: Payer: PPO | Admitting: Nurse Practitioner

## 2022-09-17 DIAGNOSIS — F39 Unspecified mood [affective] disorder: Secondary | ICD-10-CM

## 2022-09-17 DIAGNOSIS — E782 Mixed hyperlipidemia: Secondary | ICD-10-CM

## 2022-09-17 DIAGNOSIS — I4819 Other persistent atrial fibrillation: Secondary | ICD-10-CM | POA: Diagnosis not present

## 2022-09-17 DIAGNOSIS — R413 Other amnesia: Secondary | ICD-10-CM | POA: Diagnosis not present

## 2022-09-17 DIAGNOSIS — F339 Major depressive disorder, recurrent, unspecified: Secondary | ICD-10-CM | POA: Insufficient documentation

## 2022-09-17 DIAGNOSIS — Z79899 Other long term (current) drug therapy: Secondary | ICD-10-CM | POA: Diagnosis not present

## 2022-09-17 DIAGNOSIS — E039 Hypothyroidism, unspecified: Secondary | ICD-10-CM

## 2022-09-17 DIAGNOSIS — M8000XP Age-related osteoporosis with current pathological fracture, unspecified site, subsequent encounter for fracture with malunion: Secondary | ICD-10-CM | POA: Diagnosis not present

## 2022-09-17 DIAGNOSIS — F331 Major depressive disorder, recurrent, moderate: Secondary | ICD-10-CM | POA: Insufficient documentation

## 2022-09-17 DIAGNOSIS — D6869 Other thrombophilia: Secondary | ICD-10-CM

## 2022-09-17 MED ORDER — LORAZEPAM 0.5 MG PO TABS: 0.2500 mg | ORAL_TABLET | Freq: Three times a day (TID) | ORAL | 2 refills | Status: DC

## 2022-09-17 NOTE — Progress Notes (Signed)
Location:  Other Nursing Home Room Number: [id number] Place of Service:  ALF [phone])  Dewayne Shorter, MD  Patient Care Team: Dewayne Shorter, MD as PCP - General (Family Medicine) Sherran Needs, NP as Nurse Practitioner (Nurse Practitioner) Thompson Grayer, MD (Inactive) as Consulting Physician (Cardiology) Roseanne Kaufman, MD as Consulting Physician (Orthopedic Surgery) Josefine Class, MD as Referring Physician (Gastroenterology) Estill Cotta, MD as Consulting Physician (Ophthalmology) Lauree Chandler, NP as Nurse Practitioner (Geriatric Medicine)  Extended Emergency Contact Information Primary Emergency Contact: [name],[name] Address: [place] [address] Johnnette Litter of [employer] Phone: [phone] Relation: Relative Secondary Emergency Contact: Ludwig Lean Address: [address], [place] [address] [place] Work Phone: [phone] Relation: Other  Goals of care: Advanced Directive information    09/17/2022    3:00 PM  Advanced Directives  Does Patient Have a Medical Advance Directive? Yes  Type of Paramedic of [place];Living will  Does patient want to make changes to medical advance directive? No - Patient declined  Copy of [place] in Chart? Yes - validated most recent copy scanned in chart (See row information)     Chief Complaint  Patient presents with   Medical Management of Chronic Issues    Routine follow up   Immunizations    3rd COVID booster due   Quality Metric Gaps    Medicare annual wellness due    HPI:  Pt is a 77 y.o. female seen today for medical management of chronic disease. Pt is a resident of [facility]. Nursing without concerns. Pt with hx of CVA, dementia, mood disorder, a fib, hypothyroid, constipation, insomnia.  Reports anxiety is how she is- gets upset depending on how things go.  Reports she  used to be very smart but then she had a stroke which changed everything.  She is working with OT to help.  Reports depression is pretty controlled due to her medication.   Reports she takes a magic pill at night that makes her go to sleep and she sleeps until morning.  Occasionally will have a migraine in the middle of the night- plans to see a neurologist.   Reports she is lonely- does not have a best friend around here. Mother died when she was a teenager.  No children.   Denies pain.   Uses walker- has not had any recent falls.   Bowels moving well.   Hyperlipidemia- LDL goal <70.   Walks frequently.   Does not use tramadol often. Using Fioricet for headaches.      Past Medical History:  Diagnosis Date   Actinic keratosis 12/20/2012   left nasal tip   Anxiety    controlled with meds   Anxiety    Arrhythmia    Atrial fibrillation ([name])    paroxysmal, failed medical therapy with flecainide,  NSVT with tikosyn   Basal cell carcinoma 12/20/2012   left proximal mandible   Bruises easily    CVA (cerebral vascular accident) ([place]) 12/2007   Depression    medically controlled    Glaucoma    also with macular holes AE Dr. Thomasene Ripple   History of loop recorder    Hypothyroidism    Low blood pressure    no falls, but if gets up to fast   RLS (restless legs syndrome)    Stroke (  [place]) 7 years ago   Lasting effects on balance, different personality.   Thyroid disease    Tremor    Ventricular tachycardia ([place])    pt told at [facility] that she had RVOT VT   Past Surgical History:  Procedure Laterality Date   ATRIAL FIBRILLATION ABLATION  10/18/12   PVI by Dr Rayann Heman   ATRIAL FIBRILLATION ABLATION N/A 10/18/2012   Procedure: ATRIAL FIBRILLATION ABLATION;  Surgeon: Thompson Grayer, MD;  Location[facility] C[facility] LAB;  Service: Cardiovascular;  Laterality: N/A;   ATRIAL FIBRILLATION ABLATION N/A 04/18/2019   Procedure: ATRIAL FIBRILLATION ABLATION;  Surgeon: Thompson Grayer, MD;  Location: [facility] CV LAB;  Service: Cardiovascular;  Laterality: N/A;   EUS N/A 10/19/2019   Procedure: FULL UPPER ENDOSCOPIC ULTRASOUND (EUS) RADIAL;  Surgeon: Holly Bodily, MD;  Location[facility] ENDOSCOPY;  Service: Gastroenterology;  Laterality: N/A;   EYE SURGERY     on L/R eye, macular hole    FRACTURE SURGERY     implantable loop recorder placement  07/13/2019   MDT Reveal LINQ1 [facility] [id number]) implanted in office by Dr Rayann Heman for afib management post ablation   OPEN REDUCTION INTERNAL FIXATION (ORIF) DISTAL RADIAL FRACTURE Right 11/15/2014   Procedure: OPEN REDUCTION INTERNAL FIXATION (ORIF) RIGHT DISTAL RADIAL FRACTURE WITH ALLOGRAFT BONE GRAFT;  Surgeon: Roseanne Kaufman, MD;  Location: [facility];  Service: Orthopedics;  Laterality: Right;   PANCREATECTOMY N/A 02/13/2020   Procedure: LAPAROSCOPIC DISTAL PANCREATECTOMY;  Surgeon: Stark Klein, MD;  Location: [facility];  Service: General;  Laterality: N/A;   TEE WITHOUT CARDIOVERSION N/A 10/18/2012   Procedure: TRANSESOPHAGEAL ECHOCARDIOGRAM (TEE);  Surgeon: Lelon Perla, MD;  Location: [facility] ENDOSCOPY;  Service: Cardiovascular;  Laterality: N/A;  Pre-Ablation at 12pm   TONSILLECTOMY     VAGINAL HYSTERECTOMY      Allergies  Allergen Reactions   Darvon Other (See Comments)    Severe panic attacks   Propoxyphene Other (See Comments)    GI Upset Severe panic attacks GI Upset   Propoxyphene N-Acetaminophen Other (See Comments)    Severe panic attacks   Levofloxacin Anxiety and Other (See Comments)    Causes panic attacks.    Outpatient Encounter Medications as of 09/17/2022  Medication Sig   acetaminophen (TYLENOL) 325 MG tablet Take 650 mg by mouth every 4 (four) hours as needed.   acetaminophen (TYLENOL) 500 MG tablet Take 1,000 mg by mouth every 6 (six) hours as needed for mild pain or moderate pain.   alum & mag hydroxide-simeth (MAALOX/MYLANTA) 200-200-20 MG/5 SUSP Apply topically every 4 (four) hours as needed. 2  tablespoons   apixaban (ELIQUIS) 5 MG TABS tablet Take 1 tablet (5 mg total) by mouth in the morning and at bedtime.   bismuth subsalicylate (KAOPECTATE) 262 MG/15ML suspension Take 10 mLs by mouth as needed.   buPROPion ER (WELLBUTRIN SR) 100 MG 12 hr tablet Take 100 mg by mouth 2 (two) times daily.   carbamide peroxide (DEBROX) 6.5 % OTIC solution Place 5 drops into both ears as needed.   cetirizine (ZYRTEC) 10 MG chewable tablet Chew 10 mg by mouth daily.   Cranberry 425 MG CAPS Take 2 capsules by mouth at bedtime.   donepezil (ARICEPT) 10 MG tablet Take 10 mg by mouth daily.   DULoxetine (CYMBALTA) 60 MG capsule TAKE 1 CAPSULE BY MOUTH 2 TIMES DAILY.   flecainide (TAMBOCOR) 50 MG tablet Take 100 mg by mouth 2 (two) times daily.   Glucose 15 GM/32ML GEL Take  by mouth as needed.   Levothyroxine Sodium 25 MCG CAPS Take 1 capsule by mouth at bedtime.   LORazepam (ATIVAN) 0.5 MG tablet Take 0.5 tablets (0.25 mg total) by mouth 3 (three) times daily.   magnesium hydroxide (MILK OF MAGNESIA) 400 MG/5ML suspension Take by mouth as needed for mild constipation. 2 tablespoons   multivitamin-iron-minerals-folic acid (THERAPEUTIC-M) TABS tablet Take 1 tablet by mouth daily.   nystatin (MYCOSTATIN/NYSTOP) powder Apply 1 Application topically 2 (two) times daily.   ondansetron (ZOFRAN) 4 MG tablet Take 4 mg by mouth as needed for nausea.   Phenylephrine-DM-GG (TUSSIN CF) 5-10-100 MG/5ML LIQD Take by mouth every 4 (four) hours as needed. 2 tablespoons.   polyethylene glycol (MIRALAX / GLYCOLAX) 17 g packet Take 17 g by mouth as needed for mild constipation, moderate constipation or severe constipation.   pravastatin (PRAVACHOL) 10 MG tablet Take 1 tablet (10 mg total) by mouth at bedtime. Take a whole pill for pill pack and delivery   PSYLLIUM PO Take 1 Wafer by mouth as needed.   risedronate (ACTONEL) 150 MG tablet Take 150 mg by mouth every 30 (thirty) days. with water on empty stomach, nothing by mouth  or lie down for next 30 minutes.   SIMETHICONE PO Take 1 tablet by mouth as needed.   timolol (TIMOPTIC) 0.5 % ophthalmic solution Place 1 drop into the left eye at bedtime.    traMADol (ULTRAM) 50 MG tablet Take 50 mg by mouth every 12 (twelve) hours as needed for moderate pain or severe pain.   traZODone (DESYREL) 50 MG tablet Take 0.5 tablets (25 mg total) by mouth at bedtime as needed for sleep.   triamcinolone cream (KENALOG) 0.1 % Apply 1 Application topically as needed.   [DISCONTINUED] LORazepam (ATIVAN) 0.5 MG tablet Take 0.5 mg by mouth 3 (three) times daily.   No facility-administered encounter medications on file as of 09/17/2022.    Review of Systems  Constitutional:  Negative for activity change, appetite change, fatigue and unexpected weight change.  HENT:  Negative for congestion and hearing loss.   Eyes: Negative.   Respiratory:  Negative for cough and shortness of breath.   Cardiovascular:  Negative for chest pain, palpitations and leg swelling.  Gastrointestinal:  Negative for abdominal pain, constipation and diarrhea.  Genitourinary:  Negative for difficulty urinating and dysuria.  Musculoskeletal:  Negative for arthralgias and myalgias.  Skin:  Negative for color change and wound.  Neurological:  Negative for dizziness and weakness.  Psychiatric/Behavioral:  Positive for confusion. Negative for agitation and behavioral problems. The patient is nervous/anxious (at times).      Immunization History  Administered Date(s) Administered   Fluad Quad(high Dose 65+) 05/10/2019, 05/22/2020   Influenza Split 05/21/2011, 05/31/2012   Influenza Whole 06/02/2010   Influenza, High Dose Seasonal PF 05/24/2014   Influenza,inj,Quad PF,6+ Mos 04/04/2015, 05/06/2016, 05/13/2017, 05/10/2018   Influenza-Unspecified 05/20/2022   Moderna SARS-COV2 Booster Vaccination 12/09/2020   Moderna Sars-Covid-2 Vaccination 08/17/2019, 09/14/2019, 06/18/2020   Pneumococcal Conjugate-13 10/02/2013    Pneumococcal Polysaccharide-23 04/04/2015   Td 04/18/2008   Tdap 10/30/2015   Unspecified SARS-COV-2 Vaccination 06/12/2022   Zoster Recombinat (Shingrix) 01/27/2018, 03/13/2019   Zoster, Live 04/18/2008   Pertinent  Health Maintenance Due  Topic Date Due   COLONOSCOPY (Pts 45-56yr Insurance coverage will need to be confirmed)  03/06/2024   INFLUENZA VACCINE  Completed   DEXA SCAN  Completed      12/18/2020    9:07 PM 12/19/2020  7:12 AM 12/26/2020   11:44 AM 02/20/2021    1:17 PM 10/18/2021    4:29 PM  Fall Risk  Falls in the past year?   0 0   Was there an injury with Fall?   0 0   Fall Risk Category Calculator   0 0   Fall Risk Category (Retired)   Low Low   (RETIRED) Patient Fall Risk Level High fall risk High fall risk Low fall risk Low fall risk Low fall risk  Fall risk Follow up   Falls evaluation completed Falls evaluation completed    Functional Status Survey:    Vitals:   09/17/22 1449  BP: 102/60  Pulse: 67  Resp: 17  Temp: 97.9 F (36.6 C)  SpO2: 94%  Weight: 163 lb (73.9 kg)  Height: 5' 9"$  (1.753 m)   Body mass index is 24.07 kg/m. Physical Exam Constitutional:      General: She is not in acute distress.    Appearance: She is well-developed. She is not diaphoretic.  HENT:     Head: Normocephalic and atraumatic.     Mouth/Throat:     Pharynx: No oropharyngeal exudate.  Eyes:     Conjunctiva/sclera: Conjunctivae normal.     Pupils: Pupils are equal, round, and reactive to light.  Cardiovascular:     Rate and Rhythm: Normal rate and regular rhythm.     Heart sounds: Normal heart sounds.  Pulmonary:     Effort: Pulmonary effort is normal.     Breath sounds: Normal breath sounds.  Abdominal:     General: Bowel sounds are normal.     Palpations: Abdomen is soft.  Musculoskeletal:     Cervical back: Normal range of motion and neck supple.     Right lower leg: No edema.     Left lower leg: No edema.  Skin:    General: Skin is warm and dry.   Neurological:     Mental Status: She is alert. Mental status is at baseline.     Motor: No weakness.  Psychiatric:        Mood and Affect: Mood normal.     Labs reviewed: Recent Labs    12/15/21 0000 06/12/22 0000  NA 132* 135*  K 4.3 4.4  CL 96* 100  CO2 30* 30*  BUN 15 15  CREATININE 0.7 0.6  CALCIUM 9.2 9.1   Recent Labs    12/15/21 0000 06/12/22 0000  AST 16 12*  ALT 12 17  ALKPHOS 87 70  ALBUMIN 4.3 4.1   Recent Labs    12/15/21 0000 06/12/22 0000  WBC 3.5 3.6  NEUTROABS  --  1,598.00  HGB 12.6 12.8  HCT 37 39  PLT 222 222   Lab Results  Component Value Date   TSH 2.07 07/02/2020   Lab Results  Component Value Date   HGBA1C 5.6 02/14/2020   Lab Results  Component Value Date   CHOL 195 06/12/2022   HDL 107 (A) 06/12/2022   LDLCALC 75 06/12/2022   LDLDIRECT 123.5 09/18/2013   TRIG 52 06/12/2022   CHOLHDL 2 07/02/2020    Significant Diagnostic Results in last 30 days:  No results found.  Assessment/Plan 1. Persistent atrial fibrillation ([name]) Rate controlled, continues on flecainide daily   2. Mixed hyperlipidemia Continues on pravstatin 10 mg daily with dietary modifications, LDl at goal   3. Acquired hypothyroidism T3 and t4 at goal on last labs, continues on synthroid 25 mcg   4. Osteoporosis  with current pathological fracture with malunion, unspecified osteoporosis type, subsequent encounter -Recommended to take calcium 600 mg twice daily with Vitamin D 2000 units daily and weight bearing activity 30 mins/5 days a week -continues on actonel monthly  5. Polypharmacy -discussed with pt about this today. Will attempt gdr of lorazepam at this time.  6. Memory loss Stable, continues to work with OT and staff to help stay active and preserve memory.   7. Mood disorder ([place]) She is on several medications for anxiety and depression, will attempt GDR and decrease lorazepam to half tablet TID at this time - LORazepam (ATIVAN) 0.5 MG  tablet; Take 0.5 tablets (0.25 mg total) by mouth 3 (three) times daily.  Dispense: 45 tablet; Refill: 2  8. Major depressive disorder, recurrent episode, moderate ([name]) States depression in remission, doing well currently. She does have good days and bad but overall stable.   9. Secondary hypercoagulable state ([place]) Due to a fib, continues on eliquis.   Carlos American. [place], [facility] [phone]

## 2022-09-24 DIAGNOSIS — B351 Tinea unguium: Secondary | ICD-10-CM | POA: Diagnosis not present

## 2022-09-24 DIAGNOSIS — I7091 Generalized atherosclerosis: Secondary | ICD-10-CM | POA: Diagnosis not present

## 2022-09-25 DIAGNOSIS — R251 Tremor, unspecified: Secondary | ICD-10-CM | POA: Diagnosis not present

## 2022-09-25 DIAGNOSIS — R519 Headache, unspecified: Secondary | ICD-10-CM | POA: Diagnosis not present

## 2022-09-25 DIAGNOSIS — Z8673 Personal history of transient ischemic attack (TIA), and cerebral infarction without residual deficits: Secondary | ICD-10-CM | POA: Diagnosis not present

## 2022-09-25 DIAGNOSIS — R413 Other amnesia: Secondary | ICD-10-CM | POA: Diagnosis not present

## 2022-09-25 DIAGNOSIS — E538 Deficiency of other specified B group vitamins: Secondary | ICD-10-CM | POA: Diagnosis not present

## 2022-09-28 ENCOUNTER — Other Ambulatory Visit: Payer: Self-pay | Admitting: Physician Assistant

## 2022-09-28 DIAGNOSIS — R519 Headache, unspecified: Secondary | ICD-10-CM

## 2022-10-05 DIAGNOSIS — G2581 Restless legs syndrome: Secondary | ICD-10-CM | POA: Diagnosis not present

## 2022-10-05 DIAGNOSIS — E039 Hypothyroidism, unspecified: Secondary | ICD-10-CM | POA: Diagnosis not present

## 2022-10-05 DIAGNOSIS — F329 Major depressive disorder, single episode, unspecified: Secondary | ICD-10-CM | POA: Diagnosis not present

## 2022-10-05 DIAGNOSIS — G3184 Mild cognitive impairment, so stated: Secondary | ICD-10-CM | POA: Diagnosis not present

## 2022-10-05 DIAGNOSIS — R4189 Other symptoms and signs involving cognitive functions and awareness: Secondary | ICD-10-CM | POA: Diagnosis not present

## 2022-10-05 DIAGNOSIS — S92355D Nondisplaced fracture of fifth metatarsal bone, left foot, subsequent encounter for fracture with routine healing: Secondary | ICD-10-CM | POA: Diagnosis not present

## 2022-10-05 DIAGNOSIS — W19XXXD Unspecified fall, subsequent encounter: Secondary | ICD-10-CM | POA: Diagnosis not present

## 2022-10-05 DIAGNOSIS — I482 Chronic atrial fibrillation, unspecified: Secondary | ICD-10-CM | POA: Diagnosis not present

## 2022-10-05 DIAGNOSIS — S8264XD Nondisplaced fracture of lateral malleolus of right fibula, subsequent encounter for closed fracture with routine healing: Secondary | ICD-10-CM | POA: Diagnosis not present

## 2022-10-05 DIAGNOSIS — F419 Anxiety disorder, unspecified: Secondary | ICD-10-CM | POA: Diagnosis not present

## 2022-10-05 DIAGNOSIS — R278 Other lack of coordination: Secondary | ICD-10-CM | POA: Diagnosis not present

## 2022-10-05 DIAGNOSIS — M6281 Muscle weakness (generalized): Secondary | ICD-10-CM | POA: Diagnosis not present

## 2022-10-05 DIAGNOSIS — Z741 Need for assistance with personal care: Secondary | ICD-10-CM | POA: Diagnosis not present

## 2022-10-05 DIAGNOSIS — S82301D Unspecified fracture of lower end of right tibia, subsequent encounter for closed fracture with routine healing: Secondary | ICD-10-CM | POA: Diagnosis not present

## 2022-10-05 DIAGNOSIS — S82201D Unspecified fracture of shaft of right tibia, subsequent encounter for closed fracture with routine healing: Secondary | ICD-10-CM | POA: Diagnosis not present

## 2022-10-05 DIAGNOSIS — Z8673 Personal history of transient ischemic attack (TIA), and cerebral infarction without residual deficits: Secondary | ICD-10-CM | POA: Diagnosis not present

## 2022-10-05 DIAGNOSIS — R2689 Other abnormalities of gait and mobility: Secondary | ICD-10-CM | POA: Diagnosis not present

## 2022-10-05 DIAGNOSIS — Z9181 History of falling: Secondary | ICD-10-CM | POA: Diagnosis not present

## 2022-10-06 ENCOUNTER — Ambulatory Visit
Admission: RE | Admit: 2022-10-06 | Discharge: 2022-10-06 | Disposition: A | Discharge status: Home / Self Care | Payer: PPO | Source: Ambulatory Visit | Attending: Physician Assistant | Admitting: Physician Assistant

## 2022-10-06 DIAGNOSIS — R519 Headache, unspecified: Secondary | ICD-10-CM | POA: Diagnosis not present

## 2022-10-15 ENCOUNTER — Other Ambulatory Visit: Payer: Self-pay | Admitting: Nurse Practitioner

## 2022-10-15 DIAGNOSIS — R413 Other amnesia: Secondary | ICD-10-CM

## 2022-10-15 MED ORDER — LORAZEPAM 0.5 MG PO TABS: 0.5000 mg | ORAL_TABLET | Freq: Three times a day (TID) | ORAL | 2 refills | Status: DC

## 2022-10-15 NOTE — Progress Notes (Signed)
Per AL nursing unable to tolerate decrease in ativan. She is having increase in headaches as will as significant increase in overall anxiety. Will increase back to 0.5 mg TID.

## 2022-10-20 ENCOUNTER — Other Ambulatory Visit: Payer: Self-pay | Admitting: Student

## 2022-10-20 DIAGNOSIS — Z961 Presence of intraocular lens: Secondary | ICD-10-CM | POA: Diagnosis not present

## 2022-10-20 DIAGNOSIS — H40052 Ocular hypertension, left eye: Secondary | ICD-10-CM | POA: Diagnosis not present

## 2022-10-20 DIAGNOSIS — Z1231 Encounter for screening mammogram for malignant neoplasm of breast: Secondary | ICD-10-CM

## 2022-10-27 DIAGNOSIS — R251 Tremor, unspecified: Secondary | ICD-10-CM | POA: Diagnosis not present

## 2022-10-27 DIAGNOSIS — R413 Other amnesia: Secondary | ICD-10-CM | POA: Diagnosis not present

## 2022-10-27 DIAGNOSIS — R519 Headache, unspecified: Secondary | ICD-10-CM | POA: Diagnosis not present

## 2022-10-27 DIAGNOSIS — G43019 Migraine without aura, intractable, without status migrainosus: Secondary | ICD-10-CM | POA: Diagnosis not present

## 2022-10-27 DIAGNOSIS — Z8673 Personal history of transient ischemic attack (TIA), and cerebral infarction without residual deficits: Secondary | ICD-10-CM | POA: Diagnosis not present

## 2022-11-02 DIAGNOSIS — I482 Chronic atrial fibrillation, unspecified: Secondary | ICD-10-CM | POA: Diagnosis not present

## 2022-11-02 DIAGNOSIS — G3184 Mild cognitive impairment, so stated: Secondary | ICD-10-CM | POA: Diagnosis not present

## 2022-11-02 DIAGNOSIS — F419 Anxiety disorder, unspecified: Secondary | ICD-10-CM | POA: Diagnosis not present

## 2022-11-02 DIAGNOSIS — G2581 Restless legs syndrome: Secondary | ICD-10-CM | POA: Diagnosis not present

## 2022-11-02 DIAGNOSIS — F329 Major depressive disorder, single episode, unspecified: Secondary | ICD-10-CM | POA: Diagnosis not present

## 2022-11-02 DIAGNOSIS — S92355D Nondisplaced fracture of fifth metatarsal bone, left foot, subsequent encounter for fracture with routine healing: Secondary | ICD-10-CM | POA: Diagnosis not present

## 2022-11-02 DIAGNOSIS — S8264XD Nondisplaced fracture of lateral malleolus of right fibula, subsequent encounter for closed fracture with routine healing: Secondary | ICD-10-CM | POA: Diagnosis not present

## 2022-11-02 DIAGNOSIS — Z8673 Personal history of transient ischemic attack (TIA), and cerebral infarction without residual deficits: Secondary | ICD-10-CM | POA: Diagnosis not present

## 2022-11-02 DIAGNOSIS — M6281 Muscle weakness (generalized): Secondary | ICD-10-CM | POA: Diagnosis not present

## 2022-11-02 DIAGNOSIS — E039 Hypothyroidism, unspecified: Secondary | ICD-10-CM | POA: Diagnosis not present

## 2022-11-02 DIAGNOSIS — S82201D Unspecified fracture of shaft of right tibia, subsequent encounter for closed fracture with routine healing: Secondary | ICD-10-CM | POA: Diagnosis not present

## 2022-11-02 DIAGNOSIS — R2689 Other abnormalities of gait and mobility: Secondary | ICD-10-CM | POA: Diagnosis not present

## 2022-11-02 DIAGNOSIS — R278 Other lack of coordination: Secondary | ICD-10-CM | POA: Diagnosis not present

## 2022-11-02 DIAGNOSIS — Z9181 History of falling: Secondary | ICD-10-CM | POA: Diagnosis not present

## 2022-11-02 DIAGNOSIS — S82301D Unspecified fracture of lower end of right tibia, subsequent encounter for closed fracture with routine healing: Secondary | ICD-10-CM | POA: Diagnosis not present

## 2022-11-02 DIAGNOSIS — Z741 Need for assistance with personal care: Secondary | ICD-10-CM | POA: Diagnosis not present

## 2022-11-02 DIAGNOSIS — R4189 Other symptoms and signs involving cognitive functions and awareness: Secondary | ICD-10-CM | POA: Diagnosis not present

## 2022-11-02 DIAGNOSIS — W19XXXD Unspecified fall, subsequent encounter: Secondary | ICD-10-CM | POA: Diagnosis not present

## 2022-11-06 ENCOUNTER — Ambulatory Visit
Admission: RE | Admit: 2022-11-06 | Discharge: 2022-11-06 | Disposition: A | Discharge status: Home / Self Care | Payer: PPO | Source: Ambulatory Visit | Attending: Student | Admitting: Student

## 2022-11-06 DIAGNOSIS — Z1231 Encounter for screening mammogram for malignant neoplasm of breast: Secondary | ICD-10-CM | POA: Insufficient documentation

## 2022-11-18 DIAGNOSIS — S52539A Colles' fracture of unspecified radius, initial encounter for closed fracture: Secondary | ICD-10-CM | POA: Insufficient documentation

## 2022-12-07 DIAGNOSIS — E039 Hypothyroidism, unspecified: Secondary | ICD-10-CM | POA: Diagnosis not present

## 2022-12-07 DIAGNOSIS — F329 Major depressive disorder, single episode, unspecified: Secondary | ICD-10-CM | POA: Diagnosis not present

## 2022-12-07 DIAGNOSIS — W19XXXD Unspecified fall, subsequent encounter: Secondary | ICD-10-CM | POA: Diagnosis not present

## 2022-12-07 DIAGNOSIS — S82301D Unspecified fracture of lower end of right tibia, subsequent encounter for closed fracture with routine healing: Secondary | ICD-10-CM | POA: Diagnosis not present

## 2022-12-07 DIAGNOSIS — G3184 Mild cognitive impairment, so stated: Secondary | ICD-10-CM | POA: Diagnosis not present

## 2022-12-07 DIAGNOSIS — R2689 Other abnormalities of gait and mobility: Secondary | ICD-10-CM | POA: Diagnosis not present

## 2022-12-07 DIAGNOSIS — M6281 Muscle weakness (generalized): Secondary | ICD-10-CM | POA: Diagnosis not present

## 2022-12-07 DIAGNOSIS — S92355D Nondisplaced fracture of fifth metatarsal bone, left foot, subsequent encounter for fracture with routine healing: Secondary | ICD-10-CM | POA: Diagnosis not present

## 2022-12-07 DIAGNOSIS — Z741 Need for assistance with personal care: Secondary | ICD-10-CM | POA: Diagnosis not present

## 2022-12-07 DIAGNOSIS — R4189 Other symptoms and signs involving cognitive functions and awareness: Secondary | ICD-10-CM | POA: Diagnosis not present

## 2022-12-07 DIAGNOSIS — G2581 Restless legs syndrome: Secondary | ICD-10-CM | POA: Diagnosis not present

## 2022-12-07 DIAGNOSIS — I482 Chronic atrial fibrillation, unspecified: Secondary | ICD-10-CM | POA: Diagnosis not present

## 2022-12-07 DIAGNOSIS — S82201D Unspecified fracture of shaft of right tibia, subsequent encounter for closed fracture with routine healing: Secondary | ICD-10-CM | POA: Diagnosis not present

## 2022-12-07 DIAGNOSIS — F419 Anxiety disorder, unspecified: Secondary | ICD-10-CM | POA: Diagnosis not present

## 2022-12-07 DIAGNOSIS — Z9181 History of falling: Secondary | ICD-10-CM | POA: Diagnosis not present

## 2022-12-07 DIAGNOSIS — Z8673 Personal history of transient ischemic attack (TIA), and cerebral infarction without residual deficits: Secondary | ICD-10-CM | POA: Diagnosis not present

## 2022-12-07 DIAGNOSIS — S8264XD Nondisplaced fracture of lateral malleolus of right fibula, subsequent encounter for closed fracture with routine healing: Secondary | ICD-10-CM | POA: Diagnosis not present

## 2022-12-07 DIAGNOSIS — R278 Other lack of coordination: Secondary | ICD-10-CM | POA: Diagnosis not present

## 2022-12-18 ENCOUNTER — Encounter: Payer: Self-pay | Admitting: Student

## 2022-12-18 ENCOUNTER — Non-Acute Institutional Stay: Payer: PPO | Admitting: Student

## 2022-12-18 DIAGNOSIS — E039 Hypothyroidism, unspecified: Secondary | ICD-10-CM | POA: Diagnosis not present

## 2022-12-18 DIAGNOSIS — M810 Age-related osteoporosis without current pathological fracture: Secondary | ICD-10-CM

## 2022-12-18 DIAGNOSIS — Z8673 Personal history of transient ischemic attack (TIA), and cerebral infarction without residual deficits: Secondary | ICD-10-CM | POA: Diagnosis not present

## 2022-12-18 DIAGNOSIS — R296 Repeated falls: Secondary | ICD-10-CM | POA: Diagnosis not present

## 2022-12-18 DIAGNOSIS — G43809 Other migraine, not intractable, without status migrainosus: Secondary | ICD-10-CM

## 2022-12-18 DIAGNOSIS — F331 Major depressive disorder, recurrent, moderate: Secondary | ICD-10-CM

## 2022-12-18 DIAGNOSIS — I4819 Other persistent atrial fibrillation: Secondary | ICD-10-CM

## 2022-12-18 DIAGNOSIS — G3184 Mild cognitive impairment, so stated: Secondary | ICD-10-CM | POA: Diagnosis not present

## 2022-12-18 NOTE — Progress Notes (Signed)
Location:  Other [facility].  Nursing Home Room Number: [phone] Place of Service:  ALF [phone]) Provider:  Earnestine Mealing, MD  Patient Care Team: Earnestine Mealing, MD as PCP - General (Family Medicine) Newman Nip, NP (Inactive) as Nurse Practitioner (Nurse Practitioner) Hillis Range, MD (Inactive) as Consulting Physician (Cardiology) Dominica Severin, MD as Consulting Physician (Orthopedic Surgery) Elnita Maxwell, MD as Referring Physician (Gastroenterology) Sallee Lange, MD as Consulting Physician (Ophthalmology) Sharon Seller, NP as Nurse Practitioner (Geriatric Medicine)  Extended Emergency Contact Information Primary Emergency Contact: [name],[name] Address: [address]          [place], [place] [address] [place] of [place] Phone: [phone] Relation: Relative Secondary Emergency Contact: Kirstie Mirza Address: [address], [place] [address] [place] Work Phone: [phone] Relation: Other  Code Status:  Full Code.  Goals of care: Advanced Directive information    12/18/2022    1:42 PM  Advanced Directives  Does Patient Have a Medical Advance Directive? Yes  Type of Estate agent of [place];Living will  Does patient want to make changes to medical advance directive? No - Patient declined  Copy of Healthcare Power of Attorney in Chart? Yes - validated most recent copy scanned in chart (See row information)     Chief Complaint  Patient presents with  . Medical Management of Chronic Issues    Medical Management of Chronic Issues.   . Quality Metric Gaps    To discuss need for AWV or postpone if patient refuses.     HPI:  Pt is a 77 y.o. female seen today for medical management of chronic diseases.    She has had 2 falls in the last few weeks. She fell off the bed. She didn't have non-slip socks or shoes on.  Nursing concern that she had some   Past  Medical History:  Diagnosis Date  . Actinic keratosis 12/20/2012   left nasal tip  . Anxiety    controlled with meds  . Anxiety   . Arrhythmia   . Atrial fibrillation ([name])    paroxysmal, failed medical therapy with flecainide,  NSVT with tikosyn  . Basal cell carcinoma 12/20/2012   left proximal mandible  . Bruises easily   . CVA (cerebral vascular accident) ([name]) 12/2007  . Depression    medically controlled   . Glaucoma    also with macular holes AE Dr. Dorcas Mcmurray  . History of loop recorder   . Hypothyroidism   . Low blood pressure    no falls, but if gets up to fast  . RLS (restless legs syndrome)   . Stroke [facility]) 7 years ago   Lasting effects on balance, different personality.  . Thyroid disease   . Tremor   . Ventricular tachycardia ([name])    pt told at [facility] that she had RVOT VT   Past Surgical History:  Procedure Laterality Date  . ATRIAL FIBRILLATION ABLATION  10/18/12   PVI by Dr Johney Frame  . ATRIAL FIBRILLATION ABLATION N/A 10/18/2012   Procedure: ATRIAL FIBRILLATION ABLATION;  Surgeon: Hillis Range, MD;  Location: [facility] CATH LAB;  Service: Cardiovascular;  Laterality: N/A;  . ATRIAL FIBRILLATION ABLATION N/A 04/18/2019   Procedure: ATRIAL FIBRILLATION ABLATION;  Surgeon: Hillis Range, MD;  Location: [facility] INVASIVE CV LAB;  Service: Cardiovascular;  Laterality: N/A;  . EUS N/A 10/19/2019   Procedure: FULL UPPER ENDOSCOPIC ULTRASOUND (EUS) RADIAL;  Surgeon: Bearl Mulberry, MD;  Location: [facility] ENDOSCOPY;  Service: Gastroenterology;  Laterality: N/A;  . EYE SURGERY     on L/R eye, macular hole   . FRACTURE SURGERY    . implantable loop recorder placement  07/13/2019   MDT Reveal LINQ1 [facility] [id number]) implanted in office by Dr Johney Frame for afib management post ablation  . OPEN REDUCTION INTERNAL FIXATION (ORIF) DISTAL RADIAL FRACTURE Right 11/15/2014   Procedure: OPEN REDUCTION INTERNAL FIXATION (ORIF) RIGHT DISTAL RADIAL FRACTURE WITH ALLOGRAFT BONE GRAFT;  Surgeon:  Dominica Severin, MD;  Location: [facility];  Service: Orthopedics;  Laterality: Right;  . PANCREATECTOMY N/A 02/13/2020   Procedure: LAPAROSCOPIC DISTAL PANCREATECTOMY;  Surgeon: Almond Lint, MD;  Location: [facility] OR;  Service: General;  Laterality: N/A;  . TEE WITHOUT CARDIOVERSION N/A 10/18/2012   Procedure: TRANSESOPHAGEAL ECHOCARDIOGRAM (TEE);  Surgeon: Lewayne Bunting, MD;  [facility] ENDOSCOPY;  Service: Cardiovascular;  Laterality: N/A;  Pre-Ablation at 12pm  . TONSILLECTOMY    . VAGINAL HYSTERECTOMY      Allergies  Allergen Reactions  . Darvon Other (See Comments)    Severe panic attacks  . Propoxyphene Other (See Comments)    GI Upset Severe panic attacks GI Upset  . Propoxyphene N-Acetaminophen Other (See Comments)    Severe panic attacks  . Levofloxacin Anxiety and Other (See Comments)    Causes panic attacks.    Outpatient Encounter Medications as of 12/18/2022  Medication Sig  . acetaminophen (TYLENOL) 325 MG tablet Take 650 mg by mouth every 4 (four) hours as needed.  Marland Kitchen acetaminophen (TYLENOL) 500 MG tablet Take 1,000 mg by mouth every 6 (six) hours as needed for mild pain or moderate pain.  Marland Kitchen alum & mag hydroxide-simeth (MAALOX/MYLANTA) 200-200-20 MG/5 SUSP Apply topically every 4 (four) hours as needed. 2 tablespoons  . apixaban (ELIQUIS) 5 MG TABS tablet Take 1 tablet (5 mg total) by mouth in the morning and at bedtime.  . bismuth subsalicylate (KAOPECTATE) 262 MG/15ML suspension Take 10 mLs by mouth as needed.  Marland Kitchen buPROPion ER (WELLBUTRIN SR) 100 MG 12 hr tablet Take 100 mg by mouth 2 (two) times daily.  . Butalbital-APAP-Caffeine 50-300-40 MG CAPS Give one by mouth as needed for headache, can repeat after 4 hours as needed. No more than 2 pills in 24 hours. Do not take more than 2-3 times a week.  . carbamide peroxide (DEBROX) 6.5 % OTIC solution Place 5 drops into both ears as needed.  . cetirizine (ZYRTEC) 10 MG chewable tablet Chew 10 mg by mouth  daily.  . Cranberry 425 MG CAPS Take 2 capsules by mouth at bedtime.  . donepezil (ARICEPT) 10 MG tablet Take 10 mg by mouth daily.  . DULoxetine (CYMBALTA) 60 MG capsule TAKE 1 CAPSULE BY MOUTH 2 TIMES DAILY.  . flecainide (TAMBOCOR) 50 MG tablet Take 100 mg by mouth 2 (two) times daily.  Marland Kitchen gabapentin (NEURONTIN) 100 MG capsule Take 200 mg by mouth 2 (two) times daily.  . Glucose 15 GM/32ML GEL Take by mouth as needed.  . Levothyroxine Sodium 25 MCG CAPS Take 1 capsule by mouth at bedtime.  Marland Kitchen LORazepam (ATIVAN) 0.5 MG tablet Take 1 tablet (0.5 mg total) by mouth 3 (three) times daily.  Marland Kitchen MAGNESIUM GLYCINATE PO Give 600mg  by mouth once daily for headaches.  . magnesium hydroxide (MILK OF MAGNESIA) 400 MG/5ML suspension Take by mouth as needed for mild constipation. 2 tablespoons  . multivitamin-iron-minerals-folic acid (THERAPEUTIC-M) TABS tablet Take 1 tablet  by mouth daily.  Marland Kitchen nystatin (MYCOSTATIN/NYSTOP) powder Apply 1 Application topically 2 (two) times daily.  . ondansetron (ZOFRAN) 4 MG tablet Take 4 mg by mouth as needed for nausea.  . OXYGEN 2lpm as needed dyspnea/SOB  . Phenylephrine-DM-GG (TUSSIN CF) 5-10-100 MG/5ML LIQD Take by mouth every 4 (four) hours as needed. 2 tablespoons.  . polyethylene glycol (MIRALAX / GLYCOLAX) 17 g packet Take 17 g by mouth as needed for mild constipation, moderate constipation or severe constipation.  . pravastatin (PRAVACHOL) 10 MG tablet Take 1 tablet (10 mg total) by mouth at bedtime. Take a whole pill for pill pack and delivery  . PSYLLIUM PO Take 1 Wafer by mouth as needed.  . riboflavin (VITAMIN B-2) 100 MG TABS tablet Take 400 mg by mouth daily.  . risedronate (ACTONEL) 150 MG tablet Take 150 mg by mouth every 30 (thirty) days. with water on empty stomach, nothing by mouth or lie down for next 30 minutes.  Marland Kitchen SIMETHICONE PO Take 1 tablet by mouth as needed.  . timolol (TIMOPTIC) 0.5 % ophthalmic solution Place 1 drop into the left eye at bedtime.    . traMADol (ULTRAM) 50 MG tablet Take 50 mg by mouth every 12 (twelve) hours as needed for moderate pain or severe pain.  . traZODone (DESYREL) 50 MG tablet Take 0.5 tablets (25 mg total) by mouth at bedtime as needed for sleep.  Marland Kitchen triamcinolone cream (KENALOG) 0.1 % Apply 1 Application topically as needed.   No facility-administered encounter medications on file as of 12/18/2022.    Review of Systems  Immunization History  Administered Date(s) Administered  . Covid-19, Mrna,Vaccine(Spikevax)27yrs and older 11/10/2022  . Fluad Quad(high Dose 65+) 05/10/2019, 05/22/2020  . Influenza Split 05/21/2011, 05/31/2012  . Influenza Whole 06/02/2010  . Influenza, High Dose Seasonal PF 05/24/2014  . Influenza,inj,Quad PF,6+ Mos 04/04/2015, 05/06/2016, 05/13/2017, 05/10/2018  . Influenza-Unspecified 05/20/2022  . Moderna SARS-COV2 Booster Vaccination 12/09/2020  . Moderna Sars-Covid-2 Vaccination 08/17/2019, 09/14/2019, 06/18/2020  . Pneumococcal Conjugate-13 10/02/2013  . Pneumococcal Polysaccharide-23 04/04/2015  . Td 04/18/2008  . Tdap 10/30/2015  . Unspecified SARS-COV-2 Vaccination 06/12/2022  . Zoster Recombinat (Shingrix) 01/27/2018, 03/13/2019  . Zoster, Live 04/18/2008   Pertinent  Health Maintenance Due  Topic Date Due  . INFLUENZA VACCINE  03/04/2023  . COLONOSCOPY (Pts 45-54yrs Insurance coverage will need to be confirmed)  03/06/2024  . DEXA SCAN  Completed      12/18/2020    9:07 PM 12/19/2020    7:12 AM 12/26/2020   11:44 AM 02/20/2021    1:17 PM 10/18/2021    4:29 PM  Fall Risk  Falls in the past year?   0 0   Was there an injury with Fall?   0 0   Fall Risk Category Calculator   0 0   Fall Risk Category (Retired)   Low Low   (RETIRED) Patient Fall Risk Level High fall risk High fall risk Low fall risk Low fall risk Low fall risk  Fall risk Follow up   Falls evaluation completed Falls evaluation completed    Functional Status Survey:    Vitals:   12/18/22 1329  12/18/22 1330  BP: (!) 71/42 112/67  Pulse: 95   Resp: 17   Temp: 97.8 F (36.6 C)   SpO2: 93%   Weight: 167 lb 3.2 oz (75.8 kg)   Height: 5\' 9"  (1.753 m)    Body mass index is 24.69 kg/m. Physical Exam  Labs reviewed: Recent Labs  06/12/22 0000  NA 135*  K 4.4  CL 100  CO2 30*  BUN 15  CREATININE 0.6  CALCIUM 9.1   Recent Labs    06/12/22 0000  AST 12*  ALT 17  ALKPHOS 70  ALBUMIN 4.1   Recent Labs    06/12/22 0000  WBC 3.6  NEUTROABS 1,598.00  HGB 12.8  HCT 39  PLT 222   Lab Results  Component Value Date   TSH 2.07 07/02/2020   Lab Results  Component Value Date   HGBA1C 5.6 02/14/2020   Lab Results  Component Value Date   CHOL 195 06/12/2022   HDL 107 (A) 06/12/2022   LDLCALC 75 06/12/2022   LDLDIRECT 123.5 09/18/2013   TRIG 52 06/12/2022   CHOLHDL 2 07/02/2020    Significant Diagnostic Results in last 30 days:  No results found.  Assessment/Plan There are no diagnoses linked to this encounter.   Family/ staff Communication: ***  Labs/tests ordered:  ***

## 2022-12-19 DIAGNOSIS — G43909 Migraine, unspecified, not intractable, without status migrainosus: Secondary | ICD-10-CM | POA: Insufficient documentation

## 2022-12-19 DIAGNOSIS — R296 Repeated falls: Secondary | ICD-10-CM | POA: Insufficient documentation

## 2022-12-19 DIAGNOSIS — F01B4 Vascular dementia, moderate, with anxiety: Secondary | ICD-10-CM | POA: Insufficient documentation

## 2022-12-19 DIAGNOSIS — F0390 Unspecified dementia without behavioral disturbance: Secondary | ICD-10-CM | POA: Insufficient documentation

## 2022-12-19 DIAGNOSIS — G3184 Mild cognitive impairment, so stated: Secondary | ICD-10-CM | POA: Insufficient documentation

## 2022-12-21 DIAGNOSIS — I7091 Generalized atherosclerosis: Secondary | ICD-10-CM | POA: Diagnosis not present

## 2022-12-21 DIAGNOSIS — B351 Tinea unguium: Secondary | ICD-10-CM | POA: Diagnosis not present

## 2022-12-30 ENCOUNTER — Non-Acute Institutional Stay (INDEPENDENT_AMBULATORY_CARE_PROVIDER_SITE_OTHER): Payer: PPO | Admitting: Nurse Practitioner

## 2022-12-30 ENCOUNTER — Encounter: Payer: Self-pay | Admitting: Nurse Practitioner

## 2022-12-30 DIAGNOSIS — Z0000 Encounter for general adult medical examination without abnormal findings: Secondary | ICD-10-CM

## 2022-12-30 NOTE — Progress Notes (Signed)
Subjective:   NATESHIA HUFNAGLE is a 77 y.o. female who presents for Medicare Annual (Subsequent) preventive examination.  Review of Systems     Cardiac Risk Factors include: advanced age (>77men, >47 women)     Objective:    Today's Vitals   12/30/22 1057  BP: (!) 102/59  Pulse: 64  Weight: 167 lb 3.2 oz (75.8 kg)  Height: 5\' 9"  (1.753 m)  PainSc: 0-No pain   Body mass index is 24.69 kg/m.     12/30/2022   10:59 AM 12/18/2022    1:42 PM 09/17/2022    3:00 PM 06/10/2022   11:50 AM 10/18/2021    4:28 PM 12/17/2020    1:31 AM 12/16/2020    8:40 PM  Advanced Directives  Does Patient Have a Medical Advance Directive? Yes Yes Yes Yes Yes  No  Type of Estate agent of [place];Living will Healthcare Power of [place];Living will Healthcare Power of [place];Living will Healthcare Power of [place];Living will Healthcare Power of [place];Living will  Healthcare Power of [place];Living will;Out of facility DNR (pink MOST or yellow form)  Does patient want to make changes to medical advance directive? No - Patient declined No - Patient declined No - Patient declined No - Patient declined     Copy of Healthcare Power of Attorney in Chart? Yes - validated most recent copy scanned in chart (See row information) Yes - validated most recent copy scanned in chart (See row information) Yes - validated most recent copy scanned in chart (See row information) Yes - validated most recent copy scanned in chart (See row information)     Would patient like information on creating a medical advance directive?      No - Patient declined     Current Medications (verified) Outpatient Encounter Medications as of 12/30/2022  Medication Sig   acetaminophen (TYLENOL) 325 MG tablet Take 650 mg by mouth every 4 (four) hours as needed.   acetaminophen (TYLENOL) 500 MG tablet Take 1,000 mg by mouth every 6 (six) hours as needed for mild pain or moderate pain.   alum & mag hydroxide-simeth  (MAALOX/MYLANTA) 200-200-20 MG/5 SUSP Apply topically every 4 (four) hours as needed. 2 tablespoons   apixaban (ELIQUIS) 5 MG TABS tablet Take 1 tablet (5 mg total) by mouth in the morning and at bedtime.   bismuth subsalicylate (KAOPECTATE) 262 MG/15ML suspension Take 10 mLs by mouth as needed.   buPROPion ER (WELLBUTRIN SR) 100 MG 12 hr tablet Take 100 mg by mouth 2 (two) times daily.   carbamide peroxide (DEBROX) 6.5 % OTIC solution Place 5 drops into both ears as needed.   cetirizine (ZYRTEC) 10 MG chewable tablet Chew 10 mg by mouth daily.   Cranberry 425 MG CAPS Take 2 capsules by mouth at bedtime.   dextromethorphan-guaiFENesin (TUSSIN DM) 10-100 MG/5ML liquid Take 10 mLs by mouth every 4 (four) hours as needed for cough.   donepezil (ARICEPT) 10 MG tablet Take 10 mg by mouth daily.   DULoxetine (CYMBALTA) 60 MG capsule TAKE 1 CAPSULE BY MOUTH 2 TIMES DAILY.   flecainide (TAMBOCOR) 100 MG tablet Take 100 mg by mouth 2 (two) times daily.   gabapentin (NEURONTIN) 100 MG capsule Take 200 mg by mouth 2 (two) times daily.   Glucose 15 GM/32ML GEL Take by mouth as needed.   Levothyroxine Sodium 25 MCG CAPS Take 1 capsule by mouth at bedtime.   LORazepam (ATIVAN) 0.5 MG tablet Take 1 tablet (0.5 mg total) by  mouth 3 (three) times daily.   MAGNESIUM GLYCINATE PO Give 600mg  by mouth once daily for headaches.   magnesium hydroxide (MILK OF MAGNESIA) 400 MG/5ML suspension Take by mouth as needed for mild constipation. 2 tablespoons   multivitamin-iron-minerals-folic acid (THERAPEUTIC-M) TABS tablet Take 1 tablet by mouth daily.   nystatin (MYCOSTATIN/NYSTOP) powder Apply 1 Application topically 2 (two) times daily.   ondansetron (ZOFRAN) 4 MG tablet Take 4 mg by mouth as needed for nausea.   OXYGEN 2lpm as needed dyspnea/SOB   polyethylene glycol (MIRALAX / GLYCOLAX) 17 g packet Take 17 g by mouth every other day. Scheduled and as needed for constipation   pravastatin (PRAVACHOL) 10 MG tablet Take  1 tablet (10 mg total) by mouth at bedtime. Take a whole pill for pill pack and delivery   PSYLLIUM PO Take 1 Wafer by mouth as needed.   riboflavin (VITAMIN B-2) 100 MG TABS tablet Take 400 mg by mouth daily.   risedronate (ACTONEL) 150 MG tablet Take 150 mg by mouth every 30 (thirty) days. with water on empty stomach, nothing by mouth or lie down for next 30 minutes.   SIMETHICONE PO Take 1 tablet by mouth as needed.   timolol (TIMOPTIC) 0.5 % ophthalmic solution Place 1 drop into the left eye at bedtime.    traZODone (DESYREL) 50 MG tablet Take 0.5 tablets (25 mg total) by mouth at bedtime as needed for sleep.   triamcinolone cream (KENALOG) 0.1 % Apply 1 Application topically as needed.   [DISCONTINUED] flecainide (TAMBOCOR) 50 MG tablet Take 100 mg by mouth 2 (two) times daily.   No facility-administered encounter medications on file as of 12/30/2022.    Allergies (verified) Darvon, Propoxyphene, Propoxyphene n-acetaminophen, and Levofloxacin   History: Past Medical History:  Diagnosis Date   Actinic keratosis 12/20/2012   left nasal tip   Anxiety    controlled with meds   Anxiety    Arrhythmia    Atrial fibrillation ([name])    paroxysmal, failed medical therapy with flecainide,  NSVT with tikosyn   Basal cell carcinoma 12/20/2012   left proximal mandible   Bruises easily    CVA (cerebral vascular accident) ([name]) 12/2007   Depression    medically controlled    Glaucoma    also with macular holes AE Dr. Dorcas Mcmurray   History of loop recorder    Hypothyroidism    Low blood pressure    no falls, but if gets up to fast   RLS (restless legs syndrome)    Stroke ([name]) 7 years ago   Lasting effects on balance, different personality.   Thyroid disease    Tremor    Ventricular tachycardia ([name])    pt told at [facility] that she had RVOT VT   Past Surgical History:  Procedure Laterality Date   ATRIAL FIBRILLATION ABLATION  10/18/12   PVI by Dr Johney Frame   ATRIAL FIBRILLATION ABLATION  N/A 10/18/2012   Procedure: ATRIAL FIBRILLATION ABLATION;  Surgeon: Hillis Range, MD;  [facility] LAB;  Service: Cardiovascular;  Laterality: N/A;   ATRIAL FIBRILLATION ABLATION N/A 04/18/2019   Procedure: ATRIAL FIBRILLATION ABLATION;  Surgeon: Hillis Range, MD;  Location: [facility] INVASIVE CV LAB;  Service: Cardiovascular;  Laterality: N/A;   EUS N/A 10/19/2019   Procedure: FULL UPPER ENDOSCOPIC ULTRASOUND (EUS) RADIAL;  Surgeon: Bearl Mulberry, MD;  Location: [facility] ENDOSCOPY;  Service: Gastroenterology;  Laterality: N/A;   EYE SURGERY     on L/R eye, macular hole  FRACTURE SURGERY     implantable loop recorder placement  07/13/2019   MDT Reveal LINQ1 [facility] [id number]) implanted in office by Dr Johney Frame for afib management post ablation   OPEN REDUCTION INTERNAL FIXATION (ORIF) DISTAL RADIAL FRACTURE Right 11/15/2014   Procedure: OPEN REDUCTION INTERNAL FIXATION (ORIF) RIGHT DISTAL RADIAL FRACTURE WITH ALLOGRAFT BONE GRAFT;  Surgeon: Dominica Severin, MD;  Location: [facility];  Service: Orthopedics;  Laterality: Right;   PANCREATECTOMY N/A 02/13/2020   Procedure: LAPAROSCOPIC DISTAL PANCREATECTOMY;  Surgeon: Almond Lint, MD;  Location: [facility] OR;  Service: General;  Laterality: N/A;   TEE WITHOUT CARDIOVERSION N/A 10/18/2012   Procedure: TRANSESOPHAGEAL ECHOCARDIOGRAM (TEE);  Surgeon: Lewayne Bunting, MD;  Location: [facility] ENDOSCOPY;  Service: Cardiovascular;  Laterality: N/A;  Pre-Ablation at 12pm   TONSILLECTOMY     VAGINAL HYSTERECTOMY     Family History  Problem Relation Age of Onset   Multiple sclerosis Father    Breast cancer Neg Hx    Social History   Socioeconomic History   Marital status: Widowed    Spouse name: Not on file   Number of children: 0   Years of education: Not on file   Highest education level: Not on file  Occupational History   Occupation: Retired    Associate Professor: RETIRED  Tobacco Use   Smoking status: Never   Smokeless tobacco: Never  Vaping Use    Vaping Use: Never used  Substance and Sexual Activity   Alcohol use: Yes    Alcohol/week: 2.0 standard drinks of alcohol    Types: 2 Standard drinks or equivalent per week    Comment: regular   Drug use: No   Sexual activity: Yes  Other Topics Concern   Not on file  Social History Narrative   Lives in [place] with her husband.  No children 2 step kids.  Former Retail buyer   Enjoys exercise- water classes, yoga Runner, broadcasting/film/video.    Widowed 2019    Only child and mother was only child   Used to sail with husband      Has a living will-    Would desire CPR   Would not want prolonged life support if futile.      Has 1 cat lives alone no kids but see above    Social Determinants of Health   Financial Resource Strain: Low Risk  (08/09/2019)   Overall Financial Resource Strain (CARDIA)    Difficulty of Paying Living Expenses: Not hard at all  Food Insecurity: No Food Insecurity (08/09/2019)   Hunger Vital Sign    Worried About Running Out of Food in the Last Year: Never true    Ran Out of Food in the Last Year: Never true  Transportation Needs: No Transportation Needs (08/09/2019)   PRAPARE - Administrator, Civil Service (Medical): No    Lack of Transportation (Non-Medical): No  Physical Activity: Sufficiently Active (08/09/2019)   Exercise Vital Sign    Days of Exercise per Week: 7 days    Minutes of Exercise per Session: 60 min  Stress: No Stress Concern Present (08/09/2019)   [employer] of Occupational Health - Occupational Stress Questionnaire    Feeling of Stress : Only a little  Social Connections: Unknown (08/09/2019)   Social Connection and Isolation Panel [NHANES]    Frequency of Communication with Friends and Family: Three times a week    Frequency of Social Gatherings with Friends and Family: Once a week  Attends Religious Services: Not on file    Active Member of Clubs or Organizations: Not on file    Attends Club or Organization Meetings: Not  on file    Marital Status: Widowed    Tobacco Counseling Counseling given: Not Answered   Clinical Intake:  Pre-visit preparation completed: Yes  Pain : No/denies pain Pain Score: 0-No pain     BMI - recorded: 24.69 Nutritional Status: BMI of 19-24  Normal Diabetes: No  How often do you need to have someone help you when you read instructions, pamphlets, or other written materials from your doctor or pharmacy?: 3 - Sometimes  Diabetic?no         Activities of Daily Living    12/30/2022   10:40 AM  In your present state of health, do you have any difficulty performing the following activities:  Hearing? 0  Vision? 0  Difficulty concentrating or making decisions? 1  Comment trouble remembering things  Walking or climbing stairs? 1  Comment uses walker  Dressing or bathing? 0  Doing errands, shopping? 1  Comment has help  Preparing Food and eating ? N  Using the Toilet? N  In the past six months, have you accidently leaked urine? Y  Do you have problems with loss of bowel control? Y  Managing your Medications? Y  Comment nurse helps  Managing your Finances? Y  Comment has help  Housekeeping or managing your Housekeeping? Y    Patient Care Team: Earnestine Mealing, MD as PCP - General (Family Medicine) Newman Nip, NP (Inactive) as Nurse Practitioner (Nurse Practitioner) Hillis Range, MD (Inactive) as Consulting Physician (Cardiology) Dominica Severin, MD as Consulting Physician (Orthopedic Surgery) Elnita Maxwell, MD as Referring Physician (Gastroenterology) Sallee Lange, MD as Consulting Physician (Ophthalmology) Sharon Seller, NP as Nurse Practitioner (Geriatric Medicine)  Indicate any recent Medical Services you may have received from other than [facility] providers in the past year (date may be approximate).     Assessment:   This is a routine wellness examination for Maly.  Hearing/Vision screen No results found.  Dietary  issues and exercise activities discussed: Current Exercise Habits: Structured exercise class, Type of exercise: walking;calisthenics, Time (Minutes): 30, Frequency (Times/Week): 5, Weekly Exercise (Minutes/Week): 150   Goals Addressed               This Visit's Progress     Patient Stated (pt-stated)        To maintain current level of health       Depression Screen    02/20/2021    1:18 PM 12/26/2020   11:44 AM 08/14/2020    4:05 PM 06/06/2020   11:09 AM 02/02/2020   11:18 AM 12/15/2019    1:10 PM 09/22/2019    2:58 PM  PHQ 2/9 Scores  PHQ - 2 Score 3  1 0 0 0 1  PHQ- 9 Score 7   0 0    Exception Documentation  Other- indicate reason in comment box         Fall Risk    12/30/2022   10:40 AM 02/20/2021    1:17 PM 12/26/2020   11:44 AM 10/09/2020   11:48 AM 08/14/2020    4:05 PM  Fall Risk   Falls in the past year? 1 0 0 0 0  Number falls in past yr: 1 0 0 0 0  Injury with Fall? 0 0 0 0 0  Risk for fall due to : History of fall(s);Impaired balance/gait  Follow up Education provided Falls evaluation completed Falls evaluation completed Falls evaluation completed Falls evaluation completed    FALL RISK PREVENTION PERTAINING TO THE HOME:  Any stairs in or around the home? No  If so, are there any without handrails? na Home free of loose throw rugs in walkways, pet beds, electrical cords, etc? Yes  Adequate lighting in your home to reduce risk of falls? Yes   ASSISTIVE DEVICES UTILIZED TO PREVENT FALLS:  Life alert? Yes  Use of a cane, walker or w/c? Yes  Grab bars in the bathroom? Yes  Shower chair or bench in shower? Yes  Elevated toilet seat or a handicapped toilet? Yes   TIMED UP AND GO:  Was the test performed? No .   Cognitive Function:    05/07/2017    4:00 PM 05/06/2016    2:17 PM  MMSE - Mini Mental State Exam  Orientation to time 5 5  Orientation to Place 5 5  Registration 3 3  Attention/ Calculation 0 0  Recall 3 2  Recall-comments  pt was  unable to recall 1 of 3 words  Language- name 2 objects 0 0  Language- repeat 1 1  Language- follow 3 step command 3 3  Language- read & follow direction 0 0  Write a sentence 0 0  Copy design 0 0  Total score 20 19        08/09/2019   11:37 AM  6CIT Screen  What Year? 0 points  What month? 0 points  What time? 0 points  Count back from 20 0 points  Months in reverse 0 points  Repeat phrase 0 points  Total Score 0 points    Immunizations Immunization History  Administered Date(s) Administered   Covid-19, Mrna,Vaccine(Spikevax)54yrs and older 11/10/2022   Fluad Quad(high Dose 65+) 05/10/2019, 05/22/2020   Influenza Split 05/21/2011, 05/31/2012   Influenza Whole 06/02/2010   Influenza, High Dose Seasonal PF 05/24/2014   Influenza,inj,Quad PF,6+ Mos 04/04/2015, 05/06/2016, 05/13/2017, 05/10/2018   Influenza-Unspecified 05/20/2022   Moderna SARS-COV2 Booster Vaccination 12/09/2020   Moderna Sars-Covid-2 Vaccination 08/17/2019, 09/14/2019, 06/18/2020   Pneumococcal Conjugate-13 10/02/2013   Pneumococcal Polysaccharide-23 04/04/2015   Td 04/18/2008   Tdap 10/30/2015   Unspecified SARS-COV-2 Vaccination 06/12/2022   Zoster Recombinat (Shingrix) 01/27/2018, 03/13/2019   Zoster, Live 04/18/2008    TDAP status: Up to date  Flu Vaccine status: Up to date  Pneumococcal vaccine status: Up to date  Covid-19 vaccine status: Information provided on how to obtain vaccines.   Qualifies for Shingles Vaccine? Yes   Zostavax completed No   Shingrix Completed?: Yes  Screening Tests Health Maintenance  Topic Date Due   COVID-19 Vaccine (6 - 2023-24 season) 01/05/2023   INFLUENZA VACCINE  03/04/2023   Medicare Annual Wellness (AWV)  12/30/2023   Colonoscopy  03/06/2024   DTaP/Tdap/Td (3 - Td or Tdap) 10/29/2025   Pneumonia Vaccine 78+ Years old  Completed   DEXA SCAN  Completed   Hepatitis C Screening  Completed   Zoster Vaccines- Shingrix  Completed   HPV VACCINES  Aged Out     Health Maintenance  There are no preventive care reminders to display for this patient.   Colorectal cancer screening: No longer required.   Mammogram status: No longer required due to age.  Bone Density status: Completed 08/18/21. Results reflect: Bone density results: OSTEOPOROSIS. Repeat every 2 years.  Lung Cancer Screening: (Low Dose CT Chest recommended if Age 31-80 years, 30 pack-year currently smoking OR have  quit w/in 15years.) does not qualify.   Lung Cancer Screening Referral: na  Additional Screening:  Hepatitis C Screening: does qualify; Completed   Vision Screening: Recommended annual ophthalmology exams for early detection of glaucoma and other disorders of the eye. Is the patient up to date with their annual eye exam?  Yes  Who is the provider or what is the name of the office in which the patient attends annual eye exams? [facility] If pt is not established with a provider, would they like to be referred to a provider to establish care? No .   Dental Screening: Recommended annual dental exams for proper oral hygiene  Community Resource Referral / Chronic Care Management: CRR required this visit?  No   CCM required this visit?  No      Plan:     I have personally reviewed and noted the following in the patient's chart:   Medical and social history Use of alcohol, tobacco or illicit drugs  Current medications and supplements including opioid prescriptions. Patient is not currently taking opioid prescriptions. Functional ability and status Nutritional status Physical activity Advanced directives List of other physicians Hospitalizations, surgeries, and ER visits in previous 12 months Vitals Screenings to include cognitive, depression, and falls Referrals and appointments  In addition, I have reviewed and discussed with patient certain preventive protocols, quality metrics, and best practice recommendations. A written personalized care plan for  preventive services as well as general preventive health recommendations were provided to patient.     Sharon Seller, NP   12/30/2022   Place of service:[facility]

## 2023-01-01 ENCOUNTER — Non-Acute Institutional Stay: Payer: PPO | Admitting: Student

## 2023-01-01 DIAGNOSIS — I951 Orthostatic hypotension: Secondary | ICD-10-CM | POA: Diagnosis not present

## 2023-01-01 NOTE — Progress Notes (Signed)
Location:      Place of Service:    Provider:  Judeth Horn, MD  Patient Care Team: Earnestine Mealing, MD as PCP - General (Family Medicine) Newman Nip, NP (Inactive) as Nurse Practitioner (Nurse Practitioner) Hillis Range, MD (Inactive) as Consulting Physician (Cardiology) Dominica Severin, MD as Consulting Physician (Orthopedic Surgery) Elnita Maxwell, MD as Referring Physician (Gastroenterology) Sallee Lange, MD as Consulting Physician (Ophthalmology) Sharon Seller, NP as Nurse Practitioner (Geriatric Medicine)  Extended Emergency Contact Information Primary Emergency Contact: [name],[name] Address: [address]          [place], [place] [address] [place] Phone: [phone] Relation: Relative Secondary Emergency Contact: Kirstie Mirza Address: [address], [place] [address] [place] Work Phone: [phone] Relation: Other  Code Status:  full code Goals of care: Advanced Directive information    12/30/2022   10:59 AM  Advanced Directives  Does Patient Have a Medical Advance Directive? Yes  Type of Estate agent of [place];Living will  Does patient want to make changes to medical advance directive? No - Patient declined  Copy of Healthcare Power of Attorney in Chart? Yes - validated most recent copy scanned in chart (See row information)     No chief complaint on file.   HPI:  Pt is a 77 y.o. female seen today for an acute visit for dizziness  She was sitting on the deck and was comfortable. She came in and got hydrated. After drinking fluids less shaky.   Nursing performed orthostatic BP and from sit to stand decreased 127 systolic to 96 systolic.    Past Medical History:  Diagnosis Date   Actinic keratosis 12/20/2012   left nasal tip   Anxiety    controlled with meds   Anxiety    Arrhythmia    Atrial fibrillation ([name])    paroxysmal,  failed medical therapy with flecainide,  NSVT with tikosyn   Basal cell carcinoma 12/20/2012   left proximal mandible   Bruises easily    CVA (cerebral vascular accident) ([name]) 12/2007   Depression    medically controlled    Glaucoma    also with macular holes AE Dr. Dorcas Mcmurray   History of loop recorder    Hypothyroidism    Low blood pressure    no falls, but if gets up to fast   RLS (restless legs syndrome)    Stroke ([name]) 7 years ago   Lasting effects on balance, different personality.   Thyroid disease    Tremor    Ventricular tachycardia ([name])    pt told at [facility] that she had RVOT VT   Past Surgical History:  Procedure Laterality Date   ATRIAL FIBRILLATION ABLATION  10/18/12   PVI by Dr Johney Frame   ATRIAL FIBRILLATION ABLATION N/A 10/18/2012   Procedure: ATRIAL FIBRILLATION ABLATION;  Surgeon: Hillis Range, MD;  Location: [facility] CATH LAB;  Service: Cardiovascular;  Laterality: N/A;   ATRIAL FIBRILLATION ABLATION N/A 04/18/2019   Procedure: ATRIAL FIBRILLATION ABLATION;  Surgeon: Hillis Range, MD;  Location: [facility] INVASIVE CV LAB;  Service: Cardiovascular;  Laterality: N/A;   EUS N/A 10/19/2019   Procedure: FULL UPPER ENDOSCOPIC ULTRASOUND (EUS) RADIAL;  Surgeon: Bearl Mulberry, MD;  Location: [facility] ENDOSCOPY;  Service: Gastroenterology;  Laterality: N/A;   EYE SURGERY     on L/R eye, macular hole    FRACTURE SURGERY  implantable loop recorder placement  07/13/2019   MDT Reveal LINQ1 [facility] [id number]) implanted in office by Dr Johney Frame for afib management post ablation   OPEN REDUCTION INTERNAL FIXATION (ORIF) DISTAL RADIAL FRACTURE Right 11/15/2014   Procedure: OPEN REDUCTION INTERNAL FIXATION (ORIF) RIGHT DISTAL RADIAL FRACTURE WITH ALLOGRAFT BONE GRAFT;  Surgeon: Dominica Severin, MD;  Location: [facility];  Service: Orthopedics;  Laterality: Right;   PANCREATECTOMY N/A 02/13/2020   Procedure: LAPAROSCOPIC DISTAL PANCREATECTOMY;  Surgeon: Almond Lint, MD;  Location:  [facility] OR;  Service: General;  Laterality: N/A;   TEE WITHOUT CARDIOVERSION N/A 10/18/2012   Procedure: TRANSESOPHAGEAL ECHOCARDIOGRAM (TEE);  Surgeon: Lewayne Bunting, MD;  Location: [facility] ENDOSCOPY;  Service: Cardiovascular;  Laterality: N/A;  Pre-Ablation at 12pm   TONSILLECTOMY     VAGINAL HYSTERECTOMY      Allergies  Allergen Reactions   Darvon Other (See Comments)    Severe panic attacks   Propoxyphene Other (See Comments)    GI Upset Severe panic attacks GI Upset   Propoxyphene N-Acetaminophen Other (See Comments)    Severe panic attacks   Levofloxacin Anxiety and Other (See Comments)    Causes panic attacks.    Outpatient Encounter Medications as of 01/01/2023  Medication Sig   acetaminophen (TYLENOL) 325 MG tablet Take 650 mg by mouth every 4 (four) hours as needed.   acetaminophen (TYLENOL) 500 MG tablet Take 1,000 mg by mouth every 6 (six) hours as needed for mild pain or moderate pain.   alum & mag hydroxide-simeth (MAALOX/MYLANTA) 200-200-20 MG/5 SUSP Apply topically every 4 (four) hours as needed. 2 tablespoons   apixaban (ELIQUIS) 5 MG TABS tablet Take 1 tablet (5 mg total) by mouth in the morning and at bedtime.   bismuth subsalicylate (KAOPECTATE) 262 MG/15ML suspension Take 10 mLs by mouth as needed.   buPROPion ER (WELLBUTRIN SR) 100 MG 12 hr tablet Take 100 mg by mouth 2 (two) times daily.   carbamide peroxide (DEBROX) 6.5 % OTIC solution Place 5 drops into both ears as needed.   cetirizine (ZYRTEC) 10 MG chewable tablet Chew 10 mg by mouth daily.   Cranberry 425 MG CAPS Take 2 capsules by mouth at bedtime.   dextromethorphan-guaiFENesin (TUSSIN DM) 10-100 MG/5ML liquid Take 10 mLs by mouth every 4 (four) hours as needed for cough.   donepezil (ARICEPT) 10 MG tablet Take 10 mg by mouth daily.   DULoxetine (CYMBALTA) 60 MG capsule TAKE 1 CAPSULE BY MOUTH 2 TIMES DAILY.   flecainide (TAMBOCOR) 100 MG tablet Take 100 mg by mouth 2 (two) times daily.   gabapentin  (NEURONTIN) 100 MG capsule Take 200 mg by mouth 2 (two) times daily.   Glucose 15 GM/32ML GEL Take by mouth as needed.   Levothyroxine Sodium 25 MCG CAPS Take 1 capsule by mouth at bedtime.   LORazepam (ATIVAN) 0.5 MG tablet Take 1 tablet (0.5 mg total) by mouth 3 (three) times daily.   MAGNESIUM GLYCINATE PO Give 600mg  by mouth once daily for headaches.   magnesium hydroxide (MILK OF MAGNESIA) 400 MG/5ML suspension Take by mouth as needed for mild constipation. 2 tablespoons   multivitamin-iron-minerals-folic acid (THERAPEUTIC-M) TABS tablet Take 1 tablet by mouth daily.   nystatin (MYCOSTATIN/NYSTOP) powder Apply 1 Application topically 2 (two) times daily.   ondansetron (ZOFRAN) 4 MG tablet Take 4 mg by mouth as needed for nausea.   OXYGEN 2lpm as needed dyspnea/SOB   polyethylene glycol (MIRALAX / GLYCOLAX) 17 g packet Take 17 g by mouth  every other day. Scheduled and as needed for constipation   pravastatin (PRAVACHOL) 10 MG tablet Take 1 tablet (10 mg total) by mouth at bedtime. Take a whole pill for pill pack and delivery   PSYLLIUM PO Take 1 Wafer by mouth as needed.   riboflavin (VITAMIN B-2) 100 MG TABS tablet Take 400 mg by mouth daily.   risedronate (ACTONEL) 150 MG tablet Take 150 mg by mouth every 30 (thirty) days. with water on empty stomach, nothing by mouth or lie down for next 30 minutes.   SIMETHICONE PO Take 1 tablet by mouth as needed.   timolol (TIMOPTIC) 0.5 % ophthalmic solution Place 1 drop into the left eye at bedtime.    traZODone (DESYREL) 50 MG tablet Take 0.5 tablets (25 mg total) by mouth at bedtime as needed for sleep.   triamcinolone cream (KENALOG) 0.1 % Apply 1 Application topically as needed.   No facility-administered encounter medications on file as of 01/01/2023.    Review of Systems  Immunization History  Administered Date(s) Administered   Covid-19, Mrna,Vaccine(Spikevax)16yrs and older 11/10/2022   Fluad Quad(high Dose 65+) 05/10/2019, 05/22/2020    Influenza Split 05/21/2011, 05/31/2012   Influenza Whole 06/02/2010   Influenza, High Dose Seasonal PF 05/24/2014   Influenza,inj,Quad PF,6+ Mos 04/04/2015, 05/06/2016, 05/13/2017, 05/10/2018   Influenza-Unspecified 05/20/2022   Moderna SARS-COV2 Booster Vaccination 12/09/2020   Moderna Sars-Covid-2 Vaccination 08/17/2019, 09/14/2019, 06/18/2020   Pneumococcal Conjugate-13 10/02/2013   Pneumococcal Polysaccharide-23 04/04/2015   Td 04/18/2008   Tdap 10/30/2015   Unspecified SARS-COV-2 Vaccination 06/12/2022   Zoster Recombinat (Shingrix) 01/27/2018, 03/13/2019   Zoster, Live 04/18/2008   Pertinent  Health Maintenance Due  Topic Date Due   INFLUENZA VACCINE  03/04/2023   Colonoscopy  03/06/2024   DEXA SCAN  Completed      12/19/2020    7:12 AM 12/26/2020   11:44 AM 02/20/2021    1:17 PM 10/18/2021    4:29 PM 12/30/2022   10:40 AM  Fall Risk  Falls in the past year?  0 0  1  Was there an injury with Fall?  0 0  0  Fall Risk Category Calculator  0 0  2  Fall Risk Category (Retired)  Low Low    (RETIRED) Patient Fall Risk Level High fall risk Low fall risk Low fall risk Low fall risk   Patient at Risk for Falls Due to     History of fall(s);Impaired balance/gait  Fall risk Follow up  Falls evaluation completed Falls evaluation completed  Education provided   Functional Status Survey:    There were no vitals filed for this visit. There is no height or weight on file to calculate BMI. Physical Exam Constitutional:      Appearance: Normal appearance.  Cardiovascular:     Rate and Rhythm: Normal rate.  Pulmonary:     Effort: Pulmonary effort is normal.     Breath sounds: Normal breath sounds.  Abdominal:     General: Abdomen is flat.  Musculoskeletal:     Comments: Compression hose in place  Skin:    General: Skin is warm.  Neurological:     Mental Status: She is alert.     Labs reviewed: Recent Labs    06/12/22 0000  NA 135*  K 4.4  CL 100  CO2 30*  BUN 15   CREATININE 0.6  CALCIUM 9.1   Recent Labs    06/12/22 0000  AST 12*  ALT 17  ALKPHOS 70  ALBUMIN  4.1   Recent Labs    06/12/22 0000  WBC 3.6  NEUTROABS 1,598.00  HGB 12.8  HCT 39  PLT 222   Lab Results  Component Value Date   TSH 2.07 07/02/2020   Lab Results  Component Value Date   HGBA1C 5.6 02/14/2020   Lab Results  Component Value Date   CHOL 195 06/12/2022   HDL 107 (A) 06/12/2022   LDLCALC 75 06/12/2022   LDLDIRECT 123.5 09/18/2013   TRIG 52 06/12/2022   CHOLHDL 2 07/02/2020    Significant Diagnostic Results in last 30 days:  No results found.  Assessment/Plan 1. Orthostatic hypotension Patient with history of falls and previously negative orthostasic BP. After patient experienced symptoms orthostatic positive given 20 point decrease in BP with standing. Review of medications, she has numerous combinations of medications that could cause symptoms. The most likely cuase is donepezil, plan to decrease to 5 mg daily. Will consider discontinuation. Messaged cardiologist regarding dose reduction of flecainide and recommend follow up with them. Continue encouraging hydration and compression. F/u pending labs.    Family/ staff Communication: nursing  Labs/tests ordered:  CBC, CMP, Urine microalbumin, TSH

## 2023-01-04 DIAGNOSIS — Z741 Need for assistance with personal care: Secondary | ICD-10-CM | POA: Diagnosis not present

## 2023-01-04 DIAGNOSIS — S82301D Unspecified fracture of lower end of right tibia, subsequent encounter for closed fracture with routine healing: Secondary | ICD-10-CM | POA: Diagnosis not present

## 2023-01-04 DIAGNOSIS — Z9181 History of falling: Secondary | ICD-10-CM | POA: Diagnosis not present

## 2023-01-04 DIAGNOSIS — G3184 Mild cognitive impairment, so stated: Secondary | ICD-10-CM | POA: Diagnosis not present

## 2023-01-04 DIAGNOSIS — G2581 Restless legs syndrome: Secondary | ICD-10-CM | POA: Diagnosis not present

## 2023-01-04 DIAGNOSIS — Z7901 Long term (current) use of anticoagulants: Secondary | ICD-10-CM | POA: Diagnosis not present

## 2023-01-04 DIAGNOSIS — M6281 Muscle weakness (generalized): Secondary | ICD-10-CM | POA: Diagnosis not present

## 2023-01-04 DIAGNOSIS — R031 Nonspecific low blood-pressure reading: Secondary | ICD-10-CM | POA: Diagnosis not present

## 2023-01-04 DIAGNOSIS — I482 Chronic atrial fibrillation, unspecified: Secondary | ICD-10-CM | POA: Diagnosis not present

## 2023-01-04 DIAGNOSIS — Z8673 Personal history of transient ischemic attack (TIA), and cerebral infarction without residual deficits: Secondary | ICD-10-CM | POA: Diagnosis not present

## 2023-01-04 DIAGNOSIS — R278 Other lack of coordination: Secondary | ICD-10-CM | POA: Diagnosis not present

## 2023-01-04 DIAGNOSIS — R4189 Other symptoms and signs involving cognitive functions and awareness: Secondary | ICD-10-CM | POA: Diagnosis not present

## 2023-01-04 DIAGNOSIS — S92355D Nondisplaced fracture of fifth metatarsal bone, left foot, subsequent encounter for fracture with routine healing: Secondary | ICD-10-CM | POA: Diagnosis not present

## 2023-01-04 DIAGNOSIS — F329 Major depressive disorder, single episode, unspecified: Secondary | ICD-10-CM | POA: Diagnosis not present

## 2023-01-04 DIAGNOSIS — R2689 Other abnormalities of gait and mobility: Secondary | ICD-10-CM | POA: Diagnosis not present

## 2023-01-04 DIAGNOSIS — S8264XD Nondisplaced fracture of lateral malleolus of right fibula, subsequent encounter for closed fracture with routine healing: Secondary | ICD-10-CM | POA: Diagnosis not present

## 2023-01-04 DIAGNOSIS — W19XXXD Unspecified fall, subsequent encounter: Secondary | ICD-10-CM | POA: Diagnosis not present

## 2023-01-04 DIAGNOSIS — S82201D Unspecified fracture of shaft of right tibia, subsequent encounter for closed fracture with routine healing: Secondary | ICD-10-CM | POA: Diagnosis not present

## 2023-01-04 DIAGNOSIS — E039 Hypothyroidism, unspecified: Secondary | ICD-10-CM | POA: Diagnosis not present

## 2023-01-04 DIAGNOSIS — F419 Anxiety disorder, unspecified: Secondary | ICD-10-CM | POA: Diagnosis not present

## 2023-01-04 LAB — CBC: RBC: 4.19 (ref 3.87–5.11)

## 2023-01-04 LAB — CBC AND DIFFERENTIAL
HCT: 37 (ref 36–46)
Hemoglobin: 12.3 (ref 12.0–16.0)
Neutrophils Absolute: 1344
Platelets: 224 10*3/uL (ref 150–400)
WBC: 3.5

## 2023-01-04 LAB — HEPATIC FUNCTION PANEL
ALT: 13 U/L (ref 7–35)
AST: 15 (ref 13–35)
Alkaline Phosphatase: 97 (ref 25–125)
Bilirubin, Total: 0.5

## 2023-01-04 LAB — COMPREHENSIVE METABOLIC PANEL
Albumin: 4.2 (ref 3.5–5.0)
Calcium: 8.7 (ref 8.7–10.7)
Globulin: 2.2
eGFR: 89

## 2023-01-04 LAB — BASIC METABOLIC PANEL
BUN: 15 (ref 4–21)
CO2: 29 — AB (ref 13–22)
Chloride: 95 — AB (ref 99–108)
Creatinine: 0.7 (ref 0.5–1.1)
Glucose: 102
Potassium: 4 mEq/L (ref 3.5–5.1)
Sodium: 132 — AB (ref 137–147)

## 2023-01-04 LAB — TSH: TSH: 2.98 (ref 0.41–5.90)

## 2023-01-05 ENCOUNTER — Other Ambulatory Visit: Payer: Self-pay | Admitting: Physician Assistant

## 2023-01-05 ENCOUNTER — Ambulatory Visit
Admission: RE | Admit: 2023-01-05 | Discharge: 2023-01-05 | Disposition: A | Discharge status: Home / Self Care | Payer: PPO | Source: Ambulatory Visit | Attending: Physician Assistant | Admitting: Physician Assistant

## 2023-01-05 DIAGNOSIS — R413 Other amnesia: Secondary | ICD-10-CM | POA: Diagnosis not present

## 2023-01-05 DIAGNOSIS — S0990XA Unspecified injury of head, initial encounter: Secondary | ICD-10-CM | POA: Diagnosis not present

## 2023-01-05 DIAGNOSIS — R251 Tremor, unspecified: Secondary | ICD-10-CM

## 2023-01-05 DIAGNOSIS — R519 Headache, unspecified: Secondary | ICD-10-CM | POA: Diagnosis not present

## 2023-01-05 DIAGNOSIS — Z8673 Personal history of transient ischemic attack (TIA), and cerebral infarction without residual deficits: Secondary | ICD-10-CM | POA: Diagnosis not present

## 2023-01-05 DIAGNOSIS — G44319 Acute post-traumatic headache, not intractable: Secondary | ICD-10-CM | POA: Diagnosis not present

## 2023-01-11 ENCOUNTER — Other Ambulatory Visit: Payer: Self-pay | Admitting: Student

## 2023-01-11 DIAGNOSIS — I669 Occlusion and stenosis of unspecified cerebral artery: Secondary | ICD-10-CM

## 2023-01-11 DIAGNOSIS — F39 Unspecified mood [affective] disorder: Secondary | ICD-10-CM

## 2023-01-11 NOTE — Progress Notes (Signed)
Patient with long history of anxiety and a stroke. For contact please contact Grandville Silos [phone].,

## 2023-01-21 ENCOUNTER — Emergency Department: Payer: PPO

## 2023-01-21 ENCOUNTER — Telehealth: Payer: Self-pay | Admitting: Student

## 2023-01-21 ENCOUNTER — Other Ambulatory Visit: Payer: Self-pay

## 2023-01-21 ENCOUNTER — Emergency Department
Admission: EM | Admit: 2023-01-21 | Discharge: 2023-01-21 | Disposition: A | Discharge status: Home / Self Care | Payer: PPO | Attending: Emergency Medicine | Admitting: Emergency Medicine

## 2023-01-21 DIAGNOSIS — M7989 Other specified soft tissue disorders: Secondary | ICD-10-CM | POA: Diagnosis not present

## 2023-01-21 DIAGNOSIS — S6991XA Unspecified injury of right wrist, hand and finger(s), initial encounter: Secondary | ICD-10-CM | POA: Diagnosis present

## 2023-01-21 DIAGNOSIS — Z043 Encounter for examination and observation following other accident: Secondary | ICD-10-CM | POA: Diagnosis not present

## 2023-01-21 DIAGNOSIS — I951 Orthostatic hypotension: Secondary | ICD-10-CM

## 2023-01-21 DIAGNOSIS — M79641 Pain in right hand: Secondary | ICD-10-CM | POA: Diagnosis not present

## 2023-01-21 DIAGNOSIS — S62614A Displaced fracture of proximal phalanx of right ring finger, initial encounter for closed fracture: Secondary | ICD-10-CM | POA: Diagnosis not present

## 2023-01-21 DIAGNOSIS — W19XXXA Unspecified fall, initial encounter: Secondary | ICD-10-CM | POA: Insufficient documentation

## 2023-01-21 DIAGNOSIS — S60221A Contusion of right hand, initial encounter: Secondary | ICD-10-CM | POA: Diagnosis not present

## 2023-01-21 MED ORDER — HYDROCODONE-ACETAMINOPHEN 5-325 MG PO TABS
1.0000 | ORAL_TABLET | Freq: Once | ORAL | Status: AC
  Administered 2023-01-21: 1 via ORAL
  Filled 2023-01-21: qty 1

## 2023-01-21 MED ORDER — ACETAMINOPHEN 325 MG PO TABS
650.0000 mg | ORAL_TABLET | Freq: Once | ORAL | Status: AC
  Administered 2023-01-21: 650 mg via ORAL
  Filled 2023-01-21: qty 2

## 2023-01-21 MED ORDER — MIDODRINE HCL 2.5 MG PO TABS
2.50 mg | ORAL_TABLET | Freq: Three times a day (TID) | ORAL | 3 refills | Status: DC
Start: 2023-01-21 — End: 2023-02-08

## 2023-01-21 NOTE — Telephone Encounter (Signed)
Received notification that patient continues to have falls. BP 106/64. This fall with finger injury. X-ray to evaluate for fracture. Discontinue trazodone and donepezil. Change Fioricet to Excedrine Headache given risk of use of barbiturates in elderly in conjunction with other medications (benzodiazepines, SNRI). tart midodrine 2.5 mg TID with meals. BP checks TID for 5 days.

## 2023-01-21 NOTE — ED Triage Notes (Signed)
Pt presents to ER with c/o fall last night with resulting right ring finger injury.  Pt reports she lives at [facility], and states she believes she fell backwards last night, putting her right hand back to stop her fall.  Pt believes she has broken her right ring finger, and pts sister who is with her states that pt had XR done today at [facility], but does not have any results.  Pt does have sensations in finger at this time, and denies any other injuries from fall.  Pt is A&O x4 and in NAD in triage.

## 2023-01-21 NOTE — ED Provider Notes (Signed)
[facility] Provider Note  Patient Contact: 10:15 PM (approximate)   History   Finger Injury and Fall   HPI  Monica Whitaker is a 77 y.o. female presents to the emergency department after a mechanical fall last night.  Patient fell on an outstretched hand.  Patient has bruising along her right fourth and fifth digits.  No numbness or tingling.  No abrasions or lacerations.      Physical Exam   Triage Vital Signs: ED Triage Vitals  Enc Vitals Group     BP 01/21/23 1916 128/69     Pulse Rate 01/21/23 1916 (!) 59     Resp 01/21/23 1916 17     Temp 01/21/23 1916 98.4 F (36.9 C)     Temp Source 01/21/23 1916 Oral     SpO2 01/21/23 1916 95 %     Weight --      Height --      Head Circumference --      Peak Flow --      Pain Score 01/21/23 1919 7     Pain Loc --      Pain Edu? --      Excl. in GC? --     Most recent vital signs: Vitals:   01/21/23 1916  BP: 128/69  Pulse: (!) 59  Resp: 17  Temp: 98.4 F (36.9 C)  SpO2: 95%     General: Alert and in no acute distress. Eyes:  PERRL. EOMI. Head: No acute traumatic findings ENT:      Nose: No congestion/rhinnorhea.      Mouth/Throat: Mucous membranes are moist.  Neck: No stridor. No cervical spine tenderness to palpation. Cardiovascular:  Good peripheral perfusion Respiratory: Normal respiratory effort without tachypnea or retractions. Lungs CTAB. Good air entry to the bases with no decreased or absent breath sounds. Gastrointestinal: Bowel sounds 4 quadrants. Soft and nontender to palpation. No guarding or rigidity. No palpable masses. No distention. No CVA tenderness. Musculoskeletal: Patient has bruising over dorsal and volar aspect of right hand and has difficulty performing flexion and extension at the right fourth and fifth digits.  Palpable radial and ulnar pulses bilaterally and symmetrically. Neurologic:  No gross focal neurologic deficits are appreciated.  Skin:   No rash  noted    ED Results / Procedures / Treatments   Labs (all labs ordered are listed, but only abnormal results are displayed) Labs Reviewed - No data to display    RADIOLOGY  I personally viewed and evaluated these images as part of my medical decision making, as well as reviewing the written report by the radiologist.  ED Provider Interpretation: Transverse fracture of proximal phalanx.    PROCEDURES:  Critical Care performed: No  Procedures   MEDICATIONS ORDERED IN ED: Medications  acetaminophen (TYLENOL) tablet 650 mg (650 mg Oral Given 01/21/23 2222)     IMPRESSION / MDM / ASSESSMENT AND PLAN / ED COURSE  I reviewed the triage vital signs and the nursing notes.                              Assessment and plan: Hand pain 77 year old female presents to the emergency department with acute right hand pain after mechanical fall.  X-ray indicates transverse fracture of the proximal phalanx of right fourth digit.  Patient was placed in gutter splint as patient had difficulty performing flexion and extension at right fourth and fifth digits.  Patient was given a referral to hand specialist, Dr. Stephenie Acres and Tylenol was recommended for pain.  Return precautions were given to return with new or worsening symptoms.  All patient questions were answered.     FINAL CLINICAL IMPRESSION(S) / ED DIAGNOSES   Final diagnoses:  Fall, initial encounter     Rx / DC Orders   ED Discharge Orders     None        Note:  This document was prepared using Dragon voice recognition software and may include unintentional dictation errors.   Pia Mau [place], PA-C 01/21/23 2249    Chesley Noon, MD 01/26/23 [phone]

## 2023-01-21 NOTE — Discharge Instructions (Addendum)
You can take 650 mg of Tylenol every six hours as needed for hand pain.

## 2023-01-21 NOTE — ED Notes (Signed)
Pt and family verbalize understanding of discharge instructions. Opportunity for questioning and answers were provided. Pt discharged from ED to home with family.

## 2023-01-22 ENCOUNTER — Encounter: Payer: Self-pay | Admitting: Student

## 2023-01-22 ENCOUNTER — Non-Acute Institutional Stay: Payer: PPO | Admitting: Student

## 2023-01-22 DIAGNOSIS — I951 Orthostatic hypotension: Secondary | ICD-10-CM

## 2023-01-22 DIAGNOSIS — F1320 Sedative, hypnotic or anxiolytic dependence, uncomplicated: Secondary | ICD-10-CM | POA: Diagnosis not present

## 2023-01-22 DIAGNOSIS — S62614K Displaced fracture of proximal phalanx of right ring finger, subsequent encounter for fracture with nonunion: Secondary | ICD-10-CM

## 2023-01-22 DIAGNOSIS — F39 Unspecified mood [affective] disorder: Secondary | ICD-10-CM

## 2023-01-22 DIAGNOSIS — F0390 Unspecified dementia without behavioral disturbance: Secondary | ICD-10-CM

## 2023-01-22 DIAGNOSIS — R296 Repeated falls: Secondary | ICD-10-CM

## 2023-01-22 NOTE — Progress Notes (Signed)
Location:  Other [facility].  Nursing Home Room Number: [id number] Place of Service:  ALF [phone]) Provider:  Earnestine Mealing, MD  Patient Care Team: Earnestine Mealing, MD as PCP - General (Family Medicine) Newman Nip, NP (Inactive) as Nurse Practitioner (Nurse Practitioner) Hillis Range, MD (Inactive) as Consulting Physician (Cardiology) Dominica Severin, MD as Consulting Physician (Orthopedic Surgery) Elnita Maxwell, MD as Referring Physician (Gastroenterology) Sallee Lange, MD as Consulting Physician (Ophthalmology) Sharon Seller, NP as Nurse Practitioner (Geriatric Medicine)  Extended Emergency Contact Information Primary Emergency Contact: [name],[name] Address: [address]          [place], [place] [address] [place] Phone: [phone] Relation: Relative Secondary Emergency Contact: Kirstie Mirza Address: [address], [place] [address] [place] Work Phone: [phone] Relation: Other  Code Status:  Full Code Goals of care: Advanced Directive information    01/22/2023    9:14 AM  Advanced Directives  Does Patient Have a Medical Advance Directive? Yes  Type of Estate agent of [place];Living will  Does patient want to make changes to medical advance directive? No - Patient declined  Copy of Healthcare Power of Attorney in Chart? Yes - validated most recent copy scanned in chart (See row information)     Chief Complaint  Patient presents with  . Acute Visit    Falls    HPI:  Pt is a 77 y.o. female seen today for an acute visit for Falls  She had a fall in her room unwitnessed. Sister and BIL is here helping with the history as patient is having trouble remembering. She also fell when standing from the toilet.   She is tearful at the thought of moving to higher level of care  Her sister and brother in law  are here beside her. Her family hasn't seen  her since 2 years ago. Her [place] Fruit is better, but she is physically weaker than before. Some of it could have been some anxiety as well.   She needs training on how to go from siting to standing.   [id number]   Past Medical History:  Diagnosis Date  . Actinic keratosis 12/20/2012   left nasal tip  . Anxiety    controlled with meds  . Anxiety   . Arrhythmia   . Atrial fibrillation ([name])    paroxysmal, failed medical therapy with flecainide,  NSVT with tikosyn  . Basal cell carcinoma 12/20/2012   left proximal mandible  . Bruises easily   . CVA (cerebral vascular accident) ([name]) 12/2007  . Depression    medically controlled   . Glaucoma    also with macular holes AE Dr. Dorcas Mcmurray  . History of loop recorder   . Hypothyroidism   . Low blood pressure    no falls, but if gets up to fast  . RLS (restless legs syndrome)   . Stroke [facility]) 7 years ago   Lasting effects on balance, different personality.  . Thyroid disease   . Tremor   . Ventricular tachycardia ([name])    pt told at [facility] that she had RVOT VT   Past Surgical History:  Procedure Laterality Date  . ATRIAL FIBRILLATION ABLATION  10/18/12   PVI by Dr Johney Frame  . ATRIAL FIBRILLATION ABLATION N/A 10/18/2012   Procedure: ATRIAL FIBRILLATION ABLATION;  Surgeon: Hillis Range, MD;  Location: [facility] CATH LAB;  Service: Cardiovascular;  Laterality: N/A;  . ATRIAL FIBRILLATION ABLATION N/A 04/18/2019   Procedure: ATRIAL FIBRILLATION ABLATION;  Surgeon: Hillis Range, MD;  [facility]: [facility] INVASIVE CV LAB;  Service: Cardiovascular;  Laterality: N/A;  . EUS N/A 10/19/2019   Procedure: FULL UPPER ENDOSCOPIC ULTRASOUND (EUS) RADIAL;  Surgeon: Bearl Mulberry, MD;  Location: [facility] ENDOSCOPY;  Service: Gastroenterology;  Laterality: N/A;  . EYE SURGERY     on L/R eye, macular hole   . FRACTURE SURGERY    . implantable loop recorder placement  07/13/2019   MDT Reveal LINQ1 [facility] [id number]) implanted in office by Dr Johney Frame for afib  management post ablation  . OPEN REDUCTION INTERNAL FIXATION (ORIF) DISTAL RADIAL FRACTURE Right 11/15/2014   Procedure: OPEN REDUCTION INTERNAL FIXATION (ORIF) RIGHT DISTAL RADIAL FRACTURE WITH ALLOGRAFT BONE GRAFT;  Surgeon: Dominica Severin, MD;  Location:  SURGERY CENTER;  Service: Orthopedics;  Laterality: Right;  . PANCREATECTOMY N/A 02/13/2020   Procedure: LAPAROSCOPIC DISTAL PANCREATECTOMY;  Surgeon: Almond Lint, MD;  Location: [facility] OR;  Service: General;  Laterality: N/A;  . TEE WITHOUT CARDIOVERSION N/A 10/18/2012   Procedure: TRANSESOPHAGEAL ECHOCARDIOGRAM (TEE);  Surgeon: Lewayne Bunting, MD;  Location: [facility] ENDOSCOPY;  Service: Cardiovascular;  Laterality: N/A;  Pre-Ablation at 12pm  . TONSILLECTOMY    . VAGINAL HYSTERECTOMY      Allergies  Allergen Reactions  . Darvon Other (See Comments)    Severe panic attacks  . Propoxyphene Other (See Comments)    GI Upset Severe panic attacks GI Upset  . Propoxyphene N-Acetaminophen Other (See Comments)    Severe panic attacks  . Levofloxacin Anxiety and Other (See Comments)    Causes panic attacks.    Outpatient Encounter Medications as of 01/22/2023  Medication Sig  . acetaminophen (TYLENOL) 325 MG tablet Take 650 mg by mouth every 4 (four) hours as needed.  Marland Kitchen acetaminophen (TYLENOL) 500 MG tablet Take 500 mg by mouth every 6 (six) hours as needed for mild pain or moderate pain.  Marland Kitchen acetaminophen-caffeine (EXCEDRIN TENSION HEADACHE) 500-65 MG TABS per tablet Take 2 tablets by mouth every 6 (six) hours as needed.  Marland Kitchen alum & mag hydroxide-simeth (MAALOX/MYLANTA) 200-200-20 MG/5 SUSP Apply topically every 4 (four) hours as needed. 2 tablespoons  . apixaban (ELIQUIS) 5 MG TABS tablet Take 1 tablet (5 mg total) by mouth in the morning and at bedtime.  . bismuth subsalicylate (KAOPECTATE) 262 MG/15ML suspension Take 10 mLs by mouth as needed.  Marland Kitchen buPROPion ER (WELLBUTRIN SR) 100 MG 12 hr tablet Take 100 mg by mouth 2 (two) times daily.   . carbamide peroxide (DEBROX) 6.5 % OTIC solution Place 5 drops into both ears as needed.  . cetirizine (ZYRTEC) 10 MG chewable tablet Chew 10 mg by mouth daily.  . Cranberry 425 MG CAPS Take 2 capsules by mouth at bedtime.  Marland Kitchen dextromethorphan-guaiFENesin (TUSSIN DM) 10-100 MG/5ML liquid Take 10 mLs by mouth every 4 (four) hours as needed for cough.  . DULoxetine (CYMBALTA) 60 MG capsule TAKE 1 CAPSULE BY MOUTH 2 TIMES DAILY.  . flecainide (TAMBOCOR) 100 MG tablet Take 100 mg by mouth 2 (two) times daily.  Marland Kitchen gabapentin (NEURONTIN) 100 MG capsule Take 200 mg by mouth 2 (two) times daily.  . Glucose 15 GM/32ML GEL Take by mouth as needed.  . Levothyroxine Sodium 25 MCG CAPS Take 1 capsule by mouth at bedtime.  Marland Kitchen LORazepam (ATIVAN) 0.5 MG tablet Take 1 tablet (0.5 mg total) by mouth 3 (three) times daily.  Marland Kitchen MAGNESIUM GLYCINATE  PO Give 600mg  by mouth once daily for headaches.  . magnesium hydroxide (MILK OF MAGNESIA) 400 MG/5ML suspension Take by mouth as needed for mild constipation. 2 tablespoons  . midodrine (PROAMATINE) 2.5 MG tablet Take 1 tablet (2.5 mg total) by mouth 3 (three) times daily with meals.  . multivitamin-iron-minerals-folic acid (THERAPEUTIC-M) TABS tablet Take 1 tablet by mouth daily.  Marland Kitchen nystatin (MYCOSTATIN/NYSTOP) powder Apply 1 Application topically 2 (two) times daily.  . ondansetron (ZOFRAN) 4 MG tablet Take 4 mg by mouth as needed for nausea.  . OXYGEN 2lpm as needed dyspnea/SOB  . polyethylene glycol (MIRALAX / GLYCOLAX) 17 g packet Take 17 g by mouth every other day. Scheduled and as needed for constipation  . pravastatin (PRAVACHOL) 10 MG tablet Take 1 tablet (10 mg total) by mouth at bedtime. Take a whole pill for pill pack and delivery  . PSYLLIUM PO Take 1 Wafer by mouth as needed.  . riboflavin (VITAMIN B-2) 100 MG TABS tablet Take 400 mg by mouth daily.  . risedronate (ACTONEL) 150 MG tablet Take 150 mg by mouth every 30 (thirty) days. with water on empty  stomach, nothing by mouth or lie down for next 30 minutes.  Marland Kitchen SIMETHICONE PO Take 1 tablet by mouth as needed.  . timolol (TIMOPTIC) 0.5 % ophthalmic solution Place 1 drop into the left eye at bedtime.   . triamcinolone cream (KENALOG) 0.1 % Apply 1 Application topically as needed.  . [DISCONTINUED] donepezil (ARICEPT) 10 MG tablet Take 10 mg by mouth daily.  . [DISCONTINUED] traZODone (DESYREL) 50 MG tablet Take 0.5 tablets (25 mg total) by mouth at bedtime as needed for sleep.   No facility-administered encounter medications on file as of 01/22/2023.    Review of Systems  Immunization History  Administered Date(s) Administered  . Covid-19, Mrna,Vaccine(Spikevax)56yrs and older 11/10/2022  . Fluad Quad(high Dose 65+) 05/10/2019, 05/22/2020  . Influenza Split 05/21/2011, 05/31/2012  . Influenza Whole 06/02/2010  . Influenza, High Dose Seasonal PF 05/24/2014  . Influenza,inj,Quad PF,6+ Mos 04/04/2015, 05/06/2016, 05/13/2017, 05/10/2018  . Influenza-Unspecified 05/20/2022  . Moderna SARS-COV2 Booster Vaccination 12/09/2020  . Moderna Sars-Covid-2 Vaccination 08/17/2019, 09/14/2019, 06/18/2020  . Pneumococcal Conjugate-13 10/02/2013  . Pneumococcal Polysaccharide-23 04/04/2015  . Td 04/18/2008  . Tdap 10/30/2015  . Unspecified SARS-COV-2 Vaccination 06/12/2022  . Zoster Recombinat (Shingrix) 01/27/2018, 03/13/2019  . Zoster, Live 04/18/2008   Pertinent  Health Maintenance Due  Topic Date Due  . INFLUENZA VACCINE  03/04/2023  . Colonoscopy  03/06/2024  . DEXA SCAN  Completed      12/19/2020    7:12 AM 12/26/2020   11:44 AM 02/20/2021    1:17 PM 10/18/2021    4:29 PM 12/30/2022   10:40 AM  Fall Risk  Falls in the past year?  0 0  1  Was there an injury with Fall?  0 0  0  Fall Risk Category Calculator  0 0  2  Fall Risk Category (Retired)  Low Low    (RETIRED) Patient Fall Risk Level High fall risk Low fall risk Low fall risk Low fall risk   Patient at Risk for Falls Due to      History of fall(s);Impaired balance/gait  Fall risk Follow up  Falls evaluation completed Falls evaluation completed  Education provided   Functional Status Survey:    Vitals:   01/22/23 0904  BP: 106/69  Pulse: (!) 56  Resp: 16  Temp: 97.6 F (36.4 C)  SpO2: 98%  Weight: 172  lb 12.8 oz (78.4 kg)  Height: 5\' 9"  (1.753 m)   Body mass index is 25.52 kg/m. Physical Exam  Labs reviewed: Recent Labs    06/12/22 0000  NA 135*  K 4.4  CL 100  CO2 30*  BUN 15  CREATININE 0.6  CALCIUM 9.1   Recent Labs    06/12/22 0000  AST 12*  ALT 17  ALKPHOS 70  ALBUMIN 4.1   Recent Labs    06/12/22 0000  WBC 3.6  NEUTROABS 1,598.00  HGB 12.8  HCT 39  PLT 222   Lab Results  Component Value Date   TSH 2.07 07/02/2020   Lab Results  Component Value Date   HGBA1C 5.6 02/14/2020   Lab Results  Component Value Date   CHOL 195 06/12/2022   HDL 107 (A) 06/12/2022   LDLCALC 75 06/12/2022   LDLDIRECT 123.5 09/18/2013   TRIG 52 06/12/2022   CHOLHDL 2 07/02/2020    Significant Diagnostic Results in last 30 days:  DG Hand Complete Right  Result Date: 01/21/2023 CLINICAL DATA:  Fall EXAM: RIGHT HAND - COMPLETE 3+ VIEW COMPARISON:  Radiographs 01/21/2023 at 7:25 p.m. FINDINGS: Transverse fracture of the proximal metaphysis of the proximal phalanx of the ring finger with 3 mm ulnar displacement. No additional fractures. Plate and screw fixation of the distal radial metadiaphysis. No radiographic evidence of loosening. IMPRESSION: Transverse fracture of the proximal metaphysis of the proximal phalanx of the ring finger. No additional fractures Electronically Signed   By: Minerva Fester M.D.   On: 01/21/2023 22:35   DG Finger Ring Right  Result Date: 01/21/2023 CLINICAL DATA:  Fall, ring finger injury EXAM: RIGHT RING FINGER 2+V COMPARISON:  None Available. FINDINGS: Transverse fracture the proximal metaphysis of the proximal phalanx of the ring finger with 3 mm medial  displacement of the distal fracture fragment. No additional fracture identified. IMPRESSION: 1. Transverse fracture of the proximal metaphysis of the proximal phalanx of the ring finger with 3 mm medial displacement of the distal fracture fragment. Electronically Signed   By: Gaylyn Rong M.D.   On: 01/21/2023 19:36   CT HEAD WO CONTRAST ( )  Result Date: 01/05/2023 CLINICAL DATA:  Fall this morning. Headache that is getting worse. History of stroke. EXAM: CT HEAD WITHOUT CONTRAST TECHNIQUE: Contiguous axial images were obtained from the base of the skull through the vertex without intravenous contrast. RADIATION DOSE REDUCTION: This exam was performed according to the departmental dose-optimization program which includes automated exposure control, adjustment of the mA and/or kV according to patient size and/or use of iterative reconstruction technique. COMPARISON:  CT examination dated Dec 16, 2020; MR examination dated October 06, 2022 FINDINGS: Brain: No evidence of acute infarction, hemorrhage, hydrocephalus, extra-axial collection or mass lesion/mass effect. Chronic infarct of the left thalamus and left occipital lobe. Patchy areas of low-attenuation of the periventricular white matter presumed chronic microvascular ischemic changes. Vascular: No hyperdense vessel or unexpected calcification. Skull: Normal. Negative for fracture or focal lesion. Sinuses/Orbits: No acute finding. Other: None. IMPRESSION: 1. No acute intracranial abnormality. 2. Chronic infarct of the left thalamus and left occipital lobe. 3. Chronic microvascular ischemic changes of the white matter. Electronically Signed   By: Larose Hires D.O.   On: 01/05/2023 14:22    Assessment/Plan There are no diagnoses linked to this encounter.   Family/ staff Communication: ***  Labs/tests ordered:  ***

## 2023-01-23 DIAGNOSIS — S62614A Displaced fracture of proximal phalanx of right ring finger, initial encounter for closed fracture: Secondary | ICD-10-CM | POA: Insufficient documentation

## 2023-01-23 DIAGNOSIS — F1320 Sedative, hypnotic or anxiolytic dependence, uncomplicated: Secondary | ICD-10-CM | POA: Insufficient documentation

## 2023-01-26 DIAGNOSIS — S62614D Displaced fracture of proximal phalanx of right ring finger, subsequent encounter for fracture with routine healing: Secondary | ICD-10-CM | POA: Diagnosis not present

## 2023-01-28 DIAGNOSIS — S62614D Displaced fracture of proximal phalanx of right ring finger, subsequent encounter for fracture with routine healing: Secondary | ICD-10-CM | POA: Diagnosis not present

## 2023-02-01 DIAGNOSIS — Z741 Need for assistance with personal care: Secondary | ICD-10-CM | POA: Diagnosis not present

## 2023-02-01 DIAGNOSIS — S82201D Unspecified fracture of shaft of right tibia, subsequent encounter for closed fracture with routine healing: Secondary | ICD-10-CM | POA: Diagnosis not present

## 2023-02-01 DIAGNOSIS — S92355D Nondisplaced fracture of fifth metatarsal bone, left foot, subsequent encounter for fracture with routine healing: Secondary | ICD-10-CM | POA: Diagnosis not present

## 2023-02-01 DIAGNOSIS — Z9181 History of falling: Secondary | ICD-10-CM | POA: Diagnosis not present

## 2023-02-01 DIAGNOSIS — M6281 Muscle weakness (generalized): Secondary | ICD-10-CM | POA: Diagnosis not present

## 2023-02-01 DIAGNOSIS — W19XXXD Unspecified fall, subsequent encounter: Secondary | ICD-10-CM | POA: Diagnosis not present

## 2023-02-01 DIAGNOSIS — F419 Anxiety disorder, unspecified: Secondary | ICD-10-CM | POA: Diagnosis not present

## 2023-02-01 DIAGNOSIS — F329 Major depressive disorder, single episode, unspecified: Secondary | ICD-10-CM | POA: Diagnosis not present

## 2023-02-01 DIAGNOSIS — S8264XD Nondisplaced fracture of lateral malleolus of right fibula, subsequent encounter for closed fracture with routine healing: Secondary | ICD-10-CM | POA: Diagnosis not present

## 2023-02-01 DIAGNOSIS — G2581 Restless legs syndrome: Secondary | ICD-10-CM | POA: Diagnosis not present

## 2023-02-01 DIAGNOSIS — R278 Other lack of coordination: Secondary | ICD-10-CM | POA: Diagnosis not present

## 2023-02-01 DIAGNOSIS — Z8673 Personal history of transient ischemic attack (TIA), and cerebral infarction without residual deficits: Secondary | ICD-10-CM | POA: Diagnosis not present

## 2023-02-01 DIAGNOSIS — S82301D Unspecified fracture of lower end of right tibia, subsequent encounter for closed fracture with routine healing: Secondary | ICD-10-CM | POA: Diagnosis not present

## 2023-02-01 DIAGNOSIS — R2689 Other abnormalities of gait and mobility: Secondary | ICD-10-CM | POA: Diagnosis not present

## 2023-02-01 DIAGNOSIS — R4189 Other symptoms and signs involving cognitive functions and awareness: Secondary | ICD-10-CM | POA: Diagnosis not present

## 2023-02-01 DIAGNOSIS — G3184 Mild cognitive impairment, so stated: Secondary | ICD-10-CM | POA: Diagnosis not present

## 2023-02-01 DIAGNOSIS — E039 Hypothyroidism, unspecified: Secondary | ICD-10-CM | POA: Diagnosis not present

## 2023-02-01 DIAGNOSIS — I482 Chronic atrial fibrillation, unspecified: Secondary | ICD-10-CM | POA: Diagnosis not present

## 2023-02-02 DIAGNOSIS — S62614D Displaced fracture of proximal phalanx of right ring finger, subsequent encounter for fracture with routine healing: Secondary | ICD-10-CM | POA: Diagnosis not present

## 2023-02-08 ENCOUNTER — Ambulatory Visit: Payer: PPO | Attending: Internal Medicine | Admitting: Internal Medicine

## 2023-02-08 ENCOUNTER — Encounter: Payer: Self-pay | Admitting: Internal Medicine

## 2023-02-08 VITALS — BP 102/70 | HR 70 | Ht 69.0 in | Wt 169.4 lb

## 2023-02-08 DIAGNOSIS — I480 Paroxysmal atrial fibrillation: Secondary | ICD-10-CM | POA: Diagnosis not present

## 2023-02-08 LAB — CUP PACEART INCLINIC DEVICE CHECK
Date Time Interrogation Session: 20240708140439
Implantable Pulse Generator Implant Date: 20201210
Zone Setting Status: 755011
Zone Setting Status: 755011
Zone Setting Status: 755011
Zone Setting Status: 755011

## 2023-02-08 MED ORDER — MIDODRINE HCL 5 MG PO TABS: 5.0000 mg | ORAL_TABLET | Freq: Three times a day (TID) | ORAL | 3 refills | Status: AC

## 2023-02-08 NOTE — Progress Notes (Signed)
Patient Care Team: Earnestine Mealing, MD as PCP - General (Family Medicine) Newman Nip, NP (Inactive) as Nurse Practitioner (Nurse Practitioner) Hillis Range, MD (Inactive) as Consulting Physician (Cardiology) Dominica Severin, MD as Consulting Physician (Orthopedic Surgery) Elnita Maxwell, MD as Referring Physician (Gastroenterology) Sallee Lange, MD as Consulting Physician (Ophthalmology) Sharon Seller, NP as Nurse Practitioner (Geriatric Medicine)   HPI  Monica Whitaker is a 77 y.o. female Seen in followup-- longstanding pt of JA, PAF with hx of ablation X 2 2014/2020 managed with flecainide, diltiazem and anticoagulation with Apixoban.  B/c of desire to come off anticoagulation ILR implanted to monitor AF burden  ILR reports reviewed  4/22 >> afib NO--- sinus with PACs  interogated >>   Progressive dementia >>> memory care but now has been moved back to assisted living.  She has recurrent falls.  She is sent today with paperwork from [facility] documenting systolic blood pressure going from 130s--70s without appreciable change in heart rate.  She has no recollection of these falls.  Has been on flecainide since 2012  Note reviewed from psychiatry 10/22 noting depression doing pretty well.  Also "hospice counselor " but she denies that she is on hospice.  Date Cr K Hgb  5/22 0.56 3.7 11.1   11/23 0.6 yes. 4.4  12.8   DATE TEST EF   5/22 Echo   55-60 %                       Thromboembolic risk factors ( age -52, TIA/CVA-2, Gender-1) for a CHADSVASc Score of >=4   Records and Results Reviewed  Past Medical History:  Diagnosis Date   Actinic keratosis 12/20/2012   left nasal tip   Anxiety    controlled with meds   Anxiety    Arrhythmia    Atrial fibrillation ([name])    paroxysmal, failed medical therapy with flecainide,  NSVT with tikosyn   Basal cell carcinoma 12/20/2012   left proximal mandible   Bruises easily    CVA (cerebral  vascular accident) ([name]) 12/2007   Depression    medically controlled    Glaucoma    also with macular holes AE Dr. Dorcas Mcmurray   History of loop recorder    Hypothyroidism    Low blood pressure    no falls, but if gets up to fast   RLS (restless legs syndrome)    Stroke ([name]) 7 years ago   Lasting effects on balance, different personality.   Thyroid disease    Tremor    Ventricular tachycardia ([name])    pt told at [facility] that she had RVOT VT    Past Surgical History:  Procedure Laterality Date   ATRIAL FIBRILLATION ABLATION  10/18/12   PVI by Dr Johney Frame   ATRIAL FIBRILLATION ABLATION N/A 10/18/2012   Procedure: ATRIAL FIBRILLATION ABLATION;  Surgeon: Hillis Range, MD;  [facility] CATH LAB;  Service: Cardiovascular;  Laterality: N/A;   ATRIAL FIBRILLATION ABLATION N/A 04/18/2019   Procedure: ATRIAL FIBRILLATION ABLATION;  Surgeon: Hillis Range, MD;  Location: [facility] INVASIVE CV LAB;  Service: Cardiovascular;  Laterality: N/A;   EUS N/A 10/19/2019   Procedure: FULL UPPER ENDOSCOPIC ULTRASOUND (EUS) RADIAL;  Surgeon: Bearl Mulberry, MD;  Location: [facility] ENDOSCOPY;  Service: Gastroenterology;  Laterality: N/A;   EYE SURGERY     on L/R eye, macular hole    FRACTURE SURGERY     implantable loop recorder placement  07/13/2019  MDT Reveal [id number] [facility] [id number]) implanted in office by Dr Johney Frame for afib management post ablation   OPEN REDUCTION INTERNAL FIXATION (ORIF) DISTAL RADIAL FRACTURE Right 11/15/2014   Procedure: OPEN REDUCTION INTERNAL FIXATION (ORIF) RIGHT DISTAL RADIAL FRACTURE WITH ALLOGRAFT BONE GRAFT;  Surgeon: Dominica Severin, MD;  Location: [facility];  Service: Orthopedics;  Laterality: Right;   PANCREATECTOMY N/A 02/13/2020   Procedure: LAPAROSCOPIC DISTAL PANCREATECTOMY;  Surgeon: Almond Lint, MD;  Location: [facility] OR;  Service: General;  Laterality: N/A;   TEE WITHOUT CARDIOVERSION N/A 10/18/2012   Procedure: TRANSESOPHAGEAL ECHOCARDIOGRAM (TEE);  Surgeon: Lewayne Bunting, MD;  Location: [facility] ENDOSCOPY;  Service: Cardiovascular;  Laterality: N/A;  Pre-Ablation at 12pm   TONSILLECTOMY     VAGINAL HYSTERECTOMY      Current Meds  Medication Sig   acetaminophen (TYLENOL) 500 MG tablet Take 500 mg by mouth every 6 (six) hours as needed for mild pain or moderate pain.   apixaban (ELIQUIS) 5 MG TABS tablet Take 1 tablet (5 mg total) by mouth in the morning and at bedtime.   buPROPion ER (WELLBUTRIN SR) 100 MG 12 hr tablet Take 100 mg by mouth 2 (two) times daily.   Cranberry 425 MG CAPS Take 2 capsules by mouth at bedtime.   DULoxetine (CYMBALTA) 60 MG capsule TAKE 1 CAPSULE BY MOUTH 2 TIMES DAILY.   flecainide (TAMBOCOR) 100 MG tablet Take 100 mg by mouth 2 (two) times daily.   gabapentin (NEURONTIN) 100 MG capsule Take 200 mg by mouth 2 (two) times daily.   Levothyroxine Sodium 25 MCG CAPS Take 1 capsule by mouth at bedtime.   LORazepam (ATIVAN) 0.5 MG tablet Take 1 tablet (0.5 mg total) by mouth 3 (three) times daily.   MAGNESIUM GLYCINATE PO Give 600mg  by mouth once daily for headaches.   magnesium hydroxide (MILK OF MAGNESIA) 400 MG/5ML suspension Take by mouth as needed for mild constipation. 2 tablespoons   midodrine (PROAMATINE) 2.5 MG tablet Take 1 tablet (2.5 mg total) by mouth 3 (three) times daily with meals.   multivitamin-iron-minerals-folic acid (THERAPEUTIC-M) TABS tablet Take 1 tablet by mouth daily.   pravastatin (PRAVACHOL) 10 MG tablet Take 1 tablet (10 mg total) by mouth at bedtime. Take a whole pill for pill pack and delivery   riboflavin (VITAMIN B-2) 100 MG TABS tablet Take 400 mg by mouth daily.   risedronate (ACTONEL) 150 MG tablet Take 150 mg by mouth every 30 (thirty) days. with water on empty stomach, nothing by mouth or lie down for next 30 minutes.    Allergies  Allergen Reactions   Darvon Other (See Comments)    Severe panic attacks   Propoxyphene Other (See Comments)    GI Upset Severe panic attacks GI Upset    Propoxyphene N-Acetaminophen Other (See Comments)    Severe panic attacks   Levofloxacin Anxiety and Other (See Comments)    Causes panic attacks.      Review of Systems negative except from HPI and PMH  Physical Exam BP 102/70   Pulse 70   Ht 5\' 9"  (1.753 m)   Wt 169 lb 6.4 oz (76.8 kg)   SpO2 93%   BMI 25.02 kg/m  Well developed and well nourished in no acute distress HENT normal Neck supple with JVP-flat Clear Device pocket well healed; without hematoma or erythema.  There is no tethering  Regular rate and rhythm, no  murmur Abd-soft with active BS No Clubbing cyanosis  2 + edema Skin-warm and  dry A & Oriented  Grossly normal sensory and motor function   Device function is normal. Programming changes none   See Paceart for details    ECG sinus at 72 Interval 21/11/38 Axis rightward at 93  CrCl cannot be calculated (Patient's most recent lab result is older than the maximum 21 days allowed.).   Assessment and  Plan  Atrial fibrillation s/p ablation x 2  Dementia progressive  Flecainide  Orthostatic hypotension with falls    The patient has had recurrent falls with orthostatic hypotension vital signs sent from [facility] blood pressure falls notably associated with essentially no change in heart rate suggesting underlying autonomic failure. We discussed the physiology of orthostatic intolerance including gravitational fluid shifts and the impact of hypertensive vascular disease on orthostasis and treatment options.  We discussed pharmacological options including midodrine.  We discussed nonpharmacological options including raising the HOB, isometric contraction upon standing, abdominal binders  thigh sleeves. We emphasized the importance of recognizing the prodrome and sitting prior to falling, safety in the shower and in the bathroom and the avoidance of dehydration   We will begin with a nonpharmacological approach with an abdominal binder as well as  uptitrating her ProAmatine from 2.5 twice daily to 5 mg +0 700 11 00 and 1500 hours.  She will be a candidate for fludrocortisone given her relatively low supine blood pressures.  I would add this as her next medication if needed at 0.1 mg twice daily.  Note interval atrial fibrillation was detected on interrogation of her monitor of any note.  Continue the flecainide for now continue 100 mg bid,  no data that I can find about flecainide aggravating orthostasis   She has peripheral edema; however, I am loath to treat it given her orthostasis and the effect of intravascular volume on repeat risk of recurrent falls.           Current medicines are reviewed at length with the patient today .  The patient does not have concerns regarding medicines.

## 2023-02-08 NOTE — Patient Instructions (Addendum)
Medication Instructions:  Your physician has recommended you make the following change in your medication:  INCREAE your ProAmatine (Midodrine) to 5mg  at 7am, 11 am, & 3pm   *If you need a refill on your cardiac medications before your next appointment, please call your pharmacy*   Lab Work: None ordered   Testing/Procedures: None ordered   Follow-Up: At [facility], you and your health needs are our priority.  As part of our continuing mission to provide you with exceptional heart care, we have created designated Provider Care Teams.  These Care Teams include your primary Cardiologist (physician) and Advanced Practice Providers (APPs -  Physician Assistants and Nurse Practitioners) who all work together to provide you with the care you need, when you need it.  Your next appointment:   3 month(s)- 4 months in [place]  The format for your next appointment:   In Person  Provider:   Sherie Don, NP{  Thank you for choosing [facility]!!   [phone]  Other Instructions  Obtain an abdominal binder

## 2023-02-11 DIAGNOSIS — S62614D Displaced fracture of proximal phalanx of right ring finger, subsequent encounter for fracture with routine healing: Secondary | ICD-10-CM | POA: Diagnosis not present

## 2023-02-19 ENCOUNTER — Non-Acute Institutional Stay: Payer: PPO | Admitting: Student

## 2023-02-19 ENCOUNTER — Encounter: Payer: Self-pay | Admitting: Student

## 2023-02-19 DIAGNOSIS — R296 Repeated falls: Secondary | ICD-10-CM | POA: Diagnosis not present

## 2023-02-19 DIAGNOSIS — F331 Major depressive disorder, recurrent, moderate: Secondary | ICD-10-CM

## 2023-02-19 DIAGNOSIS — I480 Paroxysmal atrial fibrillation: Secondary | ICD-10-CM | POA: Diagnosis not present

## 2023-02-19 DIAGNOSIS — F900 Attention-deficit hyperactivity disorder, predominantly inattentive type: Secondary | ICD-10-CM

## 2023-02-19 DIAGNOSIS — F02A4 Dementia in other diseases classified elsewhere, mild, with anxiety: Secondary | ICD-10-CM

## 2023-02-19 DIAGNOSIS — F1320 Sedative, hypnotic or anxiolytic dependence, uncomplicated: Secondary | ICD-10-CM

## 2023-02-19 NOTE — Progress Notes (Unsigned)
Location:  Other [facility].  Nursing Home Room Number: [id number] Place of Service:  ALF [phone]) Provider:  Earnestine Mealing, MD  Patient Care Team: Earnestine Mealing, MD as PCP - General (Family Medicine) Duke Salvia, MD as PCP - Electrophysiology (Cardiology) Newman Nip, NP (Inactive) as Nurse Practitioner (Nurse Practitioner) Hillis Range, MD (Inactive) as Consulting Physician (Cardiology) Dominica Severin, MD as Consulting Physician (Orthopedic Surgery) Elnita Maxwell, MD as Referring Physician (Gastroenterology) Sallee Lange, MD as Consulting Physician (Ophthalmology) Sharon Seller, NP as Nurse Practitioner (Geriatric Medicine)  Extended Emergency Contact Information Primary Emergency Contact: [name],[name] Address: [address]          [place], [place] [address] [place] Phone: [phone] Relation: Relative Secondary Emergency Contact: Kirstie Mirza Address: [address], [place] [address] [place] Work Phone: [phone] Relation: Other  Code Status:  Full Code.  Goals of care: Advanced Directive information    02/19/2023   10:08 AM  Advanced Directives  Does Patient Have a Medical Advance Directive? Yes  Type of Estate agent of [place];Living will  Does patient want to make changes to medical advance directive? No - Patient declined  Copy of Healthcare Power of Attorney in Chart? Yes - validated most recent copy scanned in chart (See row information)     Chief Complaint  Patient presents with  . Medical Management of Chronic Issues    Medical Management of Chronic Issues.     HPI:  Pt is a 77 y.o. female seen today for medical management of chronic diseases.    She has had numerous falls. She drinks plenty of water. She takes some deep breaths and leans over before she stands up-- the changes in level are what contribute to changes in her  blood pressure.   1. Any language or communication barriers: none 2. Condition: What problems are you having now? Why are you in the hospital/facility? Scoring:  [yes/no/uncertain] She lives here because the things she needed ofr her life and fitting it into the life that she loves so she can get the care that she needs. Get help with showering twice a week she may get more help with that.  3. Alternatives: What other options do you have? Have you considered [alternative treatment  plan]? Scoring: 4. Option of Refusing Proposed Treatment/Care Plan: Can you refuse [treatment/plan]? Scoring: Its hard for her to accept help.  5. Consequences of Accepting Proposed Treatment/Care Plan: What could happen to you if you have/do  [proposed treatment/plan]? Can is cause problems/side effects? Can it help you live longer? Scoring: She could have relief of pressure from having to do things herself. Just getting used to it is the main part about it.   6. Consequences of Refusing Proposed Treatment/Care Plan: What could happen to you if you don't  have/do the proposed treatment/plan? Could you get sicker/die? What could happen if you have an  alternative treatment/plan? Scoring: She undestands that she has had falls and that is why we are recommending the help. Falling can lead to bruising, she can't get up. She has to receive more care if she  She can use her buzzer if she doesn't have people around to help with i 7a. The Person's Decision is Affected by Depression: Can you help me understand why you've decided  to accept/refuse care? Do you feel you're being punished? Do you feel that  you're being punished? Do  you think you're a bad person? Do you have any hope for the future? Do you deserve to be treated?  Scoring: Doesn't feel like it's punshment, it's jut been more of her fighting due to potential loss of independence. She has gottne some help with her last visit with OT to help prevent her from  falling out of the bed.   7b. The Person's Decision is Affected by Psychosis: Do you think anyone is trying to harm you? Do you  trust your doctor/nurse? Scoring   Past Medical History:  Diagnosis Date  . Actinic keratosis 12/20/2012   left nasal tip  . Anxiety    controlled with meds  . Anxiety   . Arrhythmia   . Atrial fibrillation ([name])    paroxysmal, failed medical therapy with flecainide,  NSVT with tikosyn  . Basal cell carcinoma 12/20/2012   left proximal mandible  . Bruises easily   . CVA (cerebral vascular accident) ([name]) 12/2007  . Depression    medically controlled   . Glaucoma    also with macular holes AE Dr. Dorcas Mcmurray  . History of loop recorder   . Hypothyroidism   . Low blood pressure    no falls, but if gets up to fast  . RLS (restless legs syndrome)   . Stroke [facility]) 7 years ago   Lasting effects on balance, different personality.  . Thyroid disease   . Tremor   . Ventricular tachycardia ([name])    pt told at [facility] that she had RVOT VT   Past Surgical History:  Procedure Laterality Date  . ATRIAL FIBRILLATION ABLATION  10/18/12   PVI by Dr Johney Frame  . ATRIAL FIBRILLATION ABLATION N/A 10/18/2012   Procedure: ATRIAL FIBRILLATION ABLATION;  Surgeon: Hillis Range, MD;  Location: [facility] CATH LAB;  Service: Cardiovascular;  Laterality: N/A;  . ATRIAL FIBRILLATION ABLATION N/A 04/18/2019   Procedure: ATRIAL FIBRILLATION ABLATION;  Surgeon: Hillis Range, MD;  Location: [facility] INVASIVE CV LAB;  Service: Cardiovascular;  Laterality: N/A;  . EUS N/A 10/19/2019   Procedure: FULL UPPER ENDOSCOPIC ULTRASOUND (EUS) RADIAL;  Surgeon: Bearl Mulberry, MD;  Location: [facility] ENDOSCOPY;  Service: Gastroenterology;  Laterality: N/A;  . EYE SURGERY     on L/R eye, macular hole   . FRACTURE SURGERY    . implantable loop recorder placement  07/13/2019   MDT Reveal LINQ1 [facility] [id number]) implanted in office by Dr Johney Frame for afib management post ablation  . OPEN REDUCTION INTERNAL  FIXATION (ORIF) DISTAL RADIAL FRACTURE Right 11/15/2014   Procedure: OPEN REDUCTION INTERNAL FIXATION (ORIF) RIGHT DISTAL RADIAL FRACTURE WITH ALLOGRAFT BONE GRAFT;  Surgeon: Dominica Severin, MD;  Location: [facility];  Service: Orthopedics;  Laterality: Right;  . PANCREATECTOMY N/A 02/13/2020   Procedure: LAPAROSCOPIC DISTAL PANCREATECTOMY;  Surgeon: Almond Lint, MD;  Location: [facility] OR;  Service: General;  Laterality: N/A;  . TEE WITHOUT CARDIOVERSION N/A 10/18/2012   Procedure: TRANSESOPHAGEAL ECHOCARDIOGRAM (TEE);  Surgeon: Lewayne Bunting, MD;  Location: [facility] ENDOSCOPY;  Service: Cardiovascular;  Laterality: N/A;  Pre-Ablation at 12pm  . TONSILLECTOMY    . VAGINAL HYSTERECTOMY      Allergies  Allergen Reactions  . Darvon Other (See Comments)    Severe panic attacks  . Propoxyphene Other (See Comments)    GI Upset Severe panic attacks GI Upset  . Propoxyphene N-Acetaminophen Other (See Comments)    Severe panic attacks  . Levofloxacin Anxiety and Other (See Comments)    Causes  panic attacks.    Outpatient Encounter Medications as of 02/19/2023  Medication Sig  . acetaminophen (TYLENOL) 325 MG tablet Take 650 mg by mouth every 4 (four) hours as needed.  Marland Kitchen acetaminophen (TYLENOL) 500 MG tablet Take 1,000 mg by mouth every 8 (eight) hours as needed for mild pain or moderate pain.  Marland Kitchen aluminum-magnesium hydroxide 200-200 MG/5ML suspension Take 10 mLs by mouth every 4 (four) hours as needed for indigestion.  Marland Kitchen apixaban (ELIQUIS) 5 MG TABS tablet Take 1 tablet (5 mg total) by mouth in the morning and at bedtime.  . bismuth subsalicylate (PEPTO BISMOL) 262 MG/15ML suspension Take 30 mLs by mouth as needed.  Marland Kitchen buPROPion ER (WELLBUTRIN SR) 100 MG 12 hr tablet Take 100 mg by mouth 2 (two) times daily.  . carbamide peroxide (DEBROX) 6.5 % OTIC solution Place 5 drops into both ears as needed.  . cetirizine (ZYRTEC) 10 MG tablet Take 10 mg by mouth daily as needed for allergies.  .  Cranberry 425 MG CAPS Take 2 capsules by mouth at bedtime.  Marland Kitchen dextromethorphan-guaiFENesin (ROBITUSSIN-DM) 10-100 MG/5ML liquid Take 5 mLs by mouth every 4 (four) hours as needed for cough.  . DULoxetine (CYMBALTA) 60 MG capsule TAKE 1 CAPSULE BY MOUTH 2 TIMES DAILY.  . flecainide (TAMBOCOR) 100 MG tablet Take 100 mg by mouth 2 (two) times daily.  Marland Kitchen gabapentin (NEURONTIN) 100 MG capsule Take 200 mg by mouth 2 (two) times daily.  . Glucose (RA TRUEPLUS GLUCOSE) 15 GM/32ML GEL Give one packet by mouth as needed for low blood sugar.  . Levothyroxine Sodium 25 MCG CAPS Take 1 capsule by mouth at bedtime.  Marland Kitchen LORazepam (ATIVAN) 0.5 MG tablet Take 1 tablet (0.5 mg total) by mouth 3 (three) times daily.  Marland Kitchen MAGNESIUM GLYCINATE PO Give 600mg  by mouth once daily for headaches.  . magnesium hydroxide (MILK OF MAGNESIA) 400 MG/5ML suspension Take by mouth as needed for mild constipation. 2 tablespoons  . midodrine (PROAMATINE) 5 MG tablet Take 1 tablet (5 mg total) by mouth 3 (three) times daily with meals. Take it at 7am, 11am & 3 pm  . multivitamin-iron-minerals-folic acid (THERAPEUTIC-M) TABS tablet Take 1 tablet by mouth daily.  Marland Kitchen nystatin (MYCOSTATIN/NYSTOP) powder Apply 1 Application topically 2 (two) times daily.  . ondansetron (ZOFRAN) 4 MG tablet Take 4 mg by mouth 3 (three) times daily as needed for nausea or vomiting.  . OXYGEN 2lpm for dyspnea or SOB  . polyethylene glycol powder (GLYCOLAX/MIRALAX) 17 GM/SCOOP powder Take 1 Container by mouth every other day. Give one scoop by mouth as needed.  . pravastatin (PRAVACHOL) 10 MG tablet Take 1 tablet (10 mg total) by mouth at bedtime. Take a whole pill for pill pack and delivery  . Psyllium (METAMUCIL) WAFR Take 1 Wafer by mouth as needed.  . riboflavin (VITAMIN B-2) 100 MG TABS tablet Take 400 mg by mouth daily.  . risedronate (ACTONEL) 150 MG tablet Take 150 mg by mouth every 30 (thirty) days. with water on empty stomach, nothing by mouth or lie down  for next 30 minutes.  . simethicone (MYLICON) 80 MG chewable tablet Chew 80 mg by mouth every 6 (six) hours as needed for flatulence.  . timolol (TIMOPTIC) 0.5 % ophthalmic solution Place 1 drop into the left eye at bedtime.  . triamcinolone cream (KENALOG) 0.1 % Apply 1 Application topically daily as needed.   No facility-administered encounter medications on file as of 02/19/2023.    Review of Systems  Immunization  History  Administered Date(s) Administered  . Covid-19, Mrna,Vaccine(Spikevax)58yrs and older 11/10/2022  . Fluad Quad(high Dose 65+) 05/10/2019, 05/22/2020  . Influenza Split 05/21/2011, 05/31/2012  . Influenza Whole 06/02/2010  . Influenza, High Dose Seasonal PF 05/24/2014  . Influenza,inj,Quad PF,6+ Mos 04/04/2015, 05/06/2016, 05/13/2017, 05/10/2018  . Influenza-Unspecified 05/20/2022  . Moderna SARS-COV2 Booster Vaccination 12/09/2020  . Moderna Sars-Covid-2 Vaccination 08/17/2019, 09/14/2019, 06/18/2020, 04/24/2021, 12/30/2021  . Pneumococcal Conjugate-13 10/02/2013  . Pneumococcal Polysaccharide-23 04/04/2015  . Td 04/18/2008  . Tdap 10/30/2015  . Unspecified SARS-COV-2 Vaccination 06/12/2022  . Zoster Recombinant(Shingrix) 01/27/2018, 03/13/2019  . Zoster, Live 04/18/2008   Pertinent  Health Maintenance Due  Topic Date Due  . INFLUENZA VACCINE  03/04/2023  . Colonoscopy  03/06/2024  . DEXA SCAN  Completed      12/19/2020    7:12 AM 12/26/2020   11:44 AM 02/20/2021    1:17 PM 10/18/2021    4:29 PM 12/30/2022   10:40 AM  Fall Risk  Falls in the past year?  0 0  1  Was there an injury with Fall?  0 0  0  Fall Risk Category Calculator  0 0  2  Fall Risk Category (Retired)  Low Low    (RETIRED) Patient Fall Risk Level High fall risk Low fall risk Low fall risk Low fall risk   Patient at Risk for Falls Due to     History of fall(s);Impaired balance/gait  Fall risk Follow up  Falls evaluation completed Falls evaluation completed  Education provided    Functional Status Survey:    Vitals:   02/19/23 0950  BP: 127/74  Pulse: 67  Resp: 17  Temp: 97.9 F (36.6 C)  SpO2: 91%  Weight: 169 lb 11.2 oz (77 kg)  Height: 5\' 9"  (1.753 m)   Body mass index is 25.06 kg/m. Physical Exam  Labs reviewed: Recent Labs    06/12/22 0000 01/04/23 0000  NA 135* 132*  K 4.4 4.0  CL 100 95*  CO2 30* 29*  BUN 15 15  CREATININE 0.6 0.7  CALCIUM 9.1 8.7   Recent Labs    06/12/22 0000 01/04/23 0000  AST 12* 15  ALT 17 13  ALKPHOS 70 97  ALBUMIN 4.1 4.2   Recent Labs    06/12/22 0000 01/04/23 0000  WBC 3.6 3.5  NEUTROABS 1,598.00 1,344.00  HGB 12.8 12.3  HCT 39 37  PLT 222 224   Lab Results  Component Value Date   TSH 2.98 01/04/2023   Lab Results  Component Value Date   HGBA1C 5.6 02/14/2020   Lab Results  Component Value Date   CHOL 195 06/12/2022   HDL 107 (A) 06/12/2022   LDLCALC 75 06/12/2022   LDLDIRECT 123.5 09/18/2013   TRIG 52 06/12/2022   CHOLHDL 2 07/02/2020    Significant Diagnostic Results in last 30 days:  CUP PACEART INCLINIC DEVICE CHECK  Result Date: 02/08/2023 Linq1 check in clinic. Programmed brady/pause detection Ova Freshwater, BSN, RN  DG Hand Complete Right  Result Date: 01/21/2023 CLINICAL DATA:  Fall EXAM: RIGHT HAND - COMPLETE 3+ VIEW COMPARISON:  Radiographs 01/21/2023 at 7:25 p.m. FINDINGS: Transverse fracture of the proximal metaphysis of the proximal phalanx of the ring finger with 3 mm ulnar displacement. No additional fractures. Plate and screw fixation of the distal radial metadiaphysis. No radiographic evidence of loosening. IMPRESSION: Transverse fracture of the proximal metaphysis of the proximal phalanx of the ring finger. No additional fractures Electronically Signed   By: Joselyn Glassman  Stutzman M.D.   On: 01/21/2023 22:35   DG Finger Ring Right  Result Date: 01/21/2023 CLINICAL DATA:  Fall, ring finger injury EXAM: RIGHT RING FINGER 2+V COMPARISON:  None Available. FINDINGS:  Transverse fracture the proximal metaphysis of the proximal phalanx of the ring finger with 3 mm medial displacement of the distal fracture fragment. No additional fracture identified. IMPRESSION: 1. Transverse fracture of the proximal metaphysis of the proximal phalanx of the ring finger with 3 mm medial displacement of the distal fracture fragment. Electronically Signed   By: Gaylyn Rong M.D.   On: 01/21/2023 19:36    Assessment/Plan There are no diagnoses linked to this encounter.   Family/ staff Communication: ***  Labs/tests ordered:  ***

## 2023-02-25 ENCOUNTER — Encounter: Payer: Self-pay | Admitting: Student

## 2023-03-04 DIAGNOSIS — S8264XD Nondisplaced fracture of lateral malleolus of right fibula, subsequent encounter for closed fracture with routine healing: Secondary | ICD-10-CM | POA: Diagnosis not present

## 2023-03-04 DIAGNOSIS — W19XXXD Unspecified fall, subsequent encounter: Secondary | ICD-10-CM | POA: Diagnosis not present

## 2023-03-04 DIAGNOSIS — Z9181 History of falling: Secondary | ICD-10-CM | POA: Diagnosis not present

## 2023-03-04 DIAGNOSIS — R278 Other lack of coordination: Secondary | ICD-10-CM | POA: Diagnosis not present

## 2023-03-04 DIAGNOSIS — S82201D Unspecified fracture of shaft of right tibia, subsequent encounter for closed fracture with routine healing: Secondary | ICD-10-CM | POA: Diagnosis not present

## 2023-03-04 DIAGNOSIS — E039 Hypothyroidism, unspecified: Secondary | ICD-10-CM | POA: Diagnosis not present

## 2023-03-04 DIAGNOSIS — R2689 Other abnormalities of gait and mobility: Secondary | ICD-10-CM | POA: Diagnosis not present

## 2023-03-04 DIAGNOSIS — Z8673 Personal history of transient ischemic attack (TIA), and cerebral infarction without residual deficits: Secondary | ICD-10-CM | POA: Diagnosis not present

## 2023-03-04 DIAGNOSIS — F329 Major depressive disorder, single episode, unspecified: Secondary | ICD-10-CM | POA: Diagnosis not present

## 2023-03-04 DIAGNOSIS — S92355D Nondisplaced fracture of fifth metatarsal bone, left foot, subsequent encounter for fracture with routine healing: Secondary | ICD-10-CM | POA: Diagnosis not present

## 2023-03-04 DIAGNOSIS — M6281 Muscle weakness (generalized): Secondary | ICD-10-CM | POA: Diagnosis not present

## 2023-03-04 DIAGNOSIS — Z741 Need for assistance with personal care: Secondary | ICD-10-CM | POA: Diagnosis not present

## 2023-03-04 DIAGNOSIS — F419 Anxiety disorder, unspecified: Secondary | ICD-10-CM | POA: Diagnosis not present

## 2023-03-04 DIAGNOSIS — I482 Chronic atrial fibrillation, unspecified: Secondary | ICD-10-CM | POA: Diagnosis not present

## 2023-03-04 DIAGNOSIS — G3184 Mild cognitive impairment, so stated: Secondary | ICD-10-CM | POA: Diagnosis not present

## 2023-03-04 DIAGNOSIS — R4189 Other symptoms and signs involving cognitive functions and awareness: Secondary | ICD-10-CM | POA: Diagnosis not present

## 2023-03-04 DIAGNOSIS — S82301D Unspecified fracture of lower end of right tibia, subsequent encounter for closed fracture with routine healing: Secondary | ICD-10-CM | POA: Diagnosis not present

## 2023-03-04 DIAGNOSIS — G2581 Restless legs syndrome: Secondary | ICD-10-CM | POA: Diagnosis not present

## 2023-03-18 DIAGNOSIS — R031 Nonspecific low blood-pressure reading: Secondary | ICD-10-CM | POA: Diagnosis not present

## 2023-03-18 LAB — BASIC METABOLIC PANEL
BUN: 24 — AB (ref 4–21)
CO2: 27 — AB (ref 13–22)
Chloride: 106 (ref 99–108)
Creatinine: 0.8 (ref 0.5–1.1)
Glucose: 90
Potassium: 4.8 meq/L (ref 3.5–5.1)
Sodium: 141 (ref 137–147)

## 2023-03-18 LAB — COMPREHENSIVE METABOLIC PANEL
Calcium: 8.8 (ref 8.7–10.7)
eGFR: 83

## 2023-03-23 DIAGNOSIS — H40052 Ocular hypertension, left eye: Secondary | ICD-10-CM | POA: Diagnosis not present

## 2023-03-23 DIAGNOSIS — Z961 Presence of intraocular lens: Secondary | ICD-10-CM | POA: Diagnosis not present

## 2023-03-30 DIAGNOSIS — B351 Tinea unguium: Secondary | ICD-10-CM | POA: Diagnosis not present

## 2023-03-30 DIAGNOSIS — I7091 Generalized atherosclerosis: Secondary | ICD-10-CM | POA: Diagnosis not present

## 2023-03-31 ENCOUNTER — Other Ambulatory Visit: Payer: Self-pay | Admitting: Nurse Practitioner

## 2023-03-31 DIAGNOSIS — R413 Other amnesia: Secondary | ICD-10-CM

## 2023-04-06 DIAGNOSIS — S92355D Nondisplaced fracture of fifth metatarsal bone, left foot, subsequent encounter for fracture with routine healing: Secondary | ICD-10-CM | POA: Diagnosis not present

## 2023-04-06 DIAGNOSIS — Z741 Need for assistance with personal care: Secondary | ICD-10-CM | POA: Diagnosis not present

## 2023-04-06 DIAGNOSIS — S82201D Unspecified fracture of shaft of right tibia, subsequent encounter for closed fracture with routine healing: Secondary | ICD-10-CM | POA: Diagnosis not present

## 2023-04-06 DIAGNOSIS — Z9181 History of falling: Secondary | ICD-10-CM | POA: Diagnosis not present

## 2023-04-06 DIAGNOSIS — G2581 Restless legs syndrome: Secondary | ICD-10-CM | POA: Diagnosis not present

## 2023-04-06 DIAGNOSIS — I482 Chronic atrial fibrillation, unspecified: Secondary | ICD-10-CM | POA: Diagnosis not present

## 2023-04-06 DIAGNOSIS — G3184 Mild cognitive impairment, so stated: Secondary | ICD-10-CM | POA: Diagnosis not present

## 2023-04-06 DIAGNOSIS — F419 Anxiety disorder, unspecified: Secondary | ICD-10-CM | POA: Diagnosis not present

## 2023-04-06 DIAGNOSIS — R2689 Other abnormalities of gait and mobility: Secondary | ICD-10-CM | POA: Diagnosis not present

## 2023-04-06 DIAGNOSIS — Z8673 Personal history of transient ischemic attack (TIA), and cerebral infarction without residual deficits: Secondary | ICD-10-CM | POA: Diagnosis not present

## 2023-04-06 DIAGNOSIS — R4189 Other symptoms and signs involving cognitive functions and awareness: Secondary | ICD-10-CM | POA: Diagnosis not present

## 2023-04-06 DIAGNOSIS — S8264XD Nondisplaced fracture of lateral malleolus of right fibula, subsequent encounter for closed fracture with routine healing: Secondary | ICD-10-CM | POA: Diagnosis not present

## 2023-04-06 DIAGNOSIS — E039 Hypothyroidism, unspecified: Secondary | ICD-10-CM | POA: Diagnosis not present

## 2023-04-06 DIAGNOSIS — M6281 Muscle weakness (generalized): Secondary | ICD-10-CM | POA: Diagnosis not present

## 2023-04-06 DIAGNOSIS — F329 Major depressive disorder, single episode, unspecified: Secondary | ICD-10-CM | POA: Diagnosis not present

## 2023-04-06 DIAGNOSIS — S82301D Unspecified fracture of lower end of right tibia, subsequent encounter for closed fracture with routine healing: Secondary | ICD-10-CM | POA: Diagnosis not present

## 2023-04-06 DIAGNOSIS — W19XXXD Unspecified fall, subsequent encounter: Secondary | ICD-10-CM | POA: Diagnosis not present

## 2023-04-06 DIAGNOSIS — R278 Other lack of coordination: Secondary | ICD-10-CM | POA: Diagnosis not present

## 2023-05-04 DIAGNOSIS — R4189 Other symptoms and signs involving cognitive functions and awareness: Secondary | ICD-10-CM | POA: Diagnosis not present

## 2023-05-04 DIAGNOSIS — Z9181 History of falling: Secondary | ICD-10-CM | POA: Diagnosis not present

## 2023-05-04 DIAGNOSIS — F329 Major depressive disorder, single episode, unspecified: Secondary | ICD-10-CM | POA: Diagnosis not present

## 2023-05-04 DIAGNOSIS — S92355D Nondisplaced fracture of fifth metatarsal bone, left foot, subsequent encounter for fracture with routine healing: Secondary | ICD-10-CM | POA: Diagnosis not present

## 2023-05-04 DIAGNOSIS — I482 Chronic atrial fibrillation, unspecified: Secondary | ICD-10-CM | POA: Diagnosis not present

## 2023-05-04 DIAGNOSIS — Z8673 Personal history of transient ischemic attack (TIA), and cerebral infarction without residual deficits: Secondary | ICD-10-CM | POA: Diagnosis not present

## 2023-05-04 DIAGNOSIS — G3184 Mild cognitive impairment, so stated: Secondary | ICD-10-CM | POA: Diagnosis not present

## 2023-05-04 DIAGNOSIS — M6281 Muscle weakness (generalized): Secondary | ICD-10-CM | POA: Diagnosis not present

## 2023-05-04 DIAGNOSIS — F419 Anxiety disorder, unspecified: Secondary | ICD-10-CM | POA: Diagnosis not present

## 2023-05-04 DIAGNOSIS — R2689 Other abnormalities of gait and mobility: Secondary | ICD-10-CM | POA: Diagnosis not present

## 2023-05-04 DIAGNOSIS — S82201D Unspecified fracture of shaft of right tibia, subsequent encounter for closed fracture with routine healing: Secondary | ICD-10-CM | POA: Diagnosis not present

## 2023-05-04 DIAGNOSIS — W19XXXD Unspecified fall, subsequent encounter: Secondary | ICD-10-CM | POA: Diagnosis not present

## 2023-05-04 DIAGNOSIS — S82301D Unspecified fracture of lower end of right tibia, subsequent encounter for closed fracture with routine healing: Secondary | ICD-10-CM | POA: Diagnosis not present

## 2023-05-04 DIAGNOSIS — R278 Other lack of coordination: Secondary | ICD-10-CM | POA: Diagnosis not present

## 2023-05-04 DIAGNOSIS — E039 Hypothyroidism, unspecified: Secondary | ICD-10-CM | POA: Diagnosis not present

## 2023-05-04 DIAGNOSIS — S8264XD Nondisplaced fracture of lateral malleolus of right fibula, subsequent encounter for closed fracture with routine healing: Secondary | ICD-10-CM | POA: Diagnosis not present

## 2023-05-04 DIAGNOSIS — G2581 Restless legs syndrome: Secondary | ICD-10-CM | POA: Diagnosis not present

## 2023-05-04 DIAGNOSIS — Z741 Need for assistance with personal care: Secondary | ICD-10-CM | POA: Diagnosis not present

## 2023-05-10 ENCOUNTER — Encounter: Payer: Self-pay | Admitting: Student

## 2023-05-10 ENCOUNTER — Non-Acute Institutional Stay: Payer: PPO | Admitting: Student

## 2023-05-10 DIAGNOSIS — F1320 Sedative, hypnotic or anxiolytic dependence, uncomplicated: Secondary | ICD-10-CM | POA: Diagnosis not present

## 2023-05-10 DIAGNOSIS — F02A4 Dementia in other diseases classified elsewhere, mild, with anxiety: Secondary | ICD-10-CM

## 2023-05-10 DIAGNOSIS — Z8673 Personal history of transient ischemic attack (TIA), and cerebral infarction without residual deficits: Secondary | ICD-10-CM | POA: Diagnosis not present

## 2023-05-10 DIAGNOSIS — Z79899 Other long term (current) drug therapy: Secondary | ICD-10-CM | POA: Diagnosis not present

## 2023-05-10 DIAGNOSIS — R300 Dysuria: Secondary | ICD-10-CM

## 2023-05-10 DIAGNOSIS — E039 Hypothyroidism, unspecified: Secondary | ICD-10-CM

## 2023-05-10 DIAGNOSIS — I951 Orthostatic hypotension: Secondary | ICD-10-CM | POA: Diagnosis not present

## 2023-05-10 DIAGNOSIS — I4819 Other persistent atrial fibrillation: Secondary | ICD-10-CM

## 2023-05-10 DIAGNOSIS — F331 Major depressive disorder, recurrent, moderate: Secondary | ICD-10-CM | POA: Diagnosis not present

## 2023-05-10 NOTE — Progress Notes (Signed)
Location:  Other   Place of Service:  [facility] [phone]) Provider:  Ander Gaster, Benetta Spar, MD  Patient Care Team: Earnestine Mealing, MD as PCP - General (Family Medicine) Duke Salvia, MD as PCP - Electrophysiology (Cardiology) Newman Nip, NP (Inactive) as Nurse Practitioner (Nurse Practitioner) Hillis Range, MD (Inactive) as Consulting Physician (Cardiology) Dominica Severin, MD as Consulting Physician (Orthopedic Surgery) Elnita Maxwell, MD as Referring Physician (Gastroenterology) Sallee Lange, MD as Consulting Physician (Ophthalmology) Sharon Seller, NP as Nurse Practitioner (Geriatric Medicine)  Extended Emergency Contact Information Primary Emergency Contact: [name],[name] Address: [address]          [place], [place] [address] [place] Phone: [phone] Relation: Relative Secondary Emergency Contact: Kirstie Mirza Address: [address] [place], [place] [address] [place] Work Phone: [phone] Relation: Other  Code Status:  Full Code Goals of care: Advanced Directive information    02/19/2023   10:08 AM  Advanced Directives  Does Patient Have a Medical Advance Directive? Yes  Type of Estate agent of [place];Living will  Does patient want to make changes to medical advance directive? No - Patient declined  Copy of Healthcare Power of Attorney in Chart? Yes - validated most recent copy scanned in chart (See row information)     Chief Complaint  Patient presents with   Routine Home Visit    HPI:  Pt is a 77 y.o. female seen today for medical management of chronic diseases.   Received notification from NAL RN who states that patient has been more tearful and had poor sleep at night. She says, scratch the crying because she has been feeling fine. She doesn't think she has had melatonin but doesn't know about it.  She states she has had a "funny feeling" while  urinating lately.  She felt feverish today, however this was subjective in nature as she is afebrile. Past Medical History:  Diagnosis Date   Actinic keratosis 12/20/2012   left nasal tip   Anxiety    controlled with meds   Anxiety    Arrhythmia    Atrial fibrillation ([name])    paroxysmal, failed medical therapy with flecainide,  NSVT with tikosyn   Basal cell carcinoma 12/20/2012   left proximal mandible   Bruises easily    CVA (cerebral vascular accident) ([name]) 12/2007   Depression    medically controlled    Glaucoma    also with macular holes AE Dr. Dorcas Mcmurray   History of loop recorder    Hypothyroidism    Low blood pressure    no falls, but if gets up to fast   RLS (restless legs syndrome)    Stroke ([name]) 7 years ago   Lasting effects on balance, different personality.   Thyroid disease    Tremor    Ventricular tachycardia ([name])    pt told at [facility] that she had RVOT VT   Past Surgical History:  Procedure Laterality Date   ATRIAL FIBRILLATION ABLATION  10/18/12   PVI by Dr Johney Frame   ATRIAL FIBRILLATION ABLATION N/A 10/18/2012   Procedure: ATRIAL FIBRILLATION ABLATION;  Surgeon: Hillis Range, MD;  Location: [facility] CATH LAB;  Service: Cardiovascular;  Laterality: N/A;   ATRIAL FIBRILLATION ABLATION N/A 04/18/2019   Procedure: ATRIAL FIBRILLATION ABLATION;  Surgeon: Hillis Range, MD;  [facility]: [facility] INVASIVE CV LAB;  Service: Cardiovascular;  Laterality: N/A;   EUS N/A 10/19/2019  Procedure: FULL UPPER ENDOSCOPIC ULTRASOUND (EUS) RADIAL;  Surgeon: Bearl Mulberry, MD;  Location: [facility] ENDOSCOPY;  Service: Gastroenterology;  Laterality: N/A;   EYE SURGERY     on L/R eye, macular hole    FRACTURE SURGERY     implantable loop recorder placement  07/13/2019   MDT Reveal LINQ1 [facility] [id number]) implanted in office by Dr Johney Frame for afib management post ablation   OPEN REDUCTION INTERNAL FIXATION (ORIF) DISTAL RADIAL FRACTURE Right 11/15/2014   Procedure: OPEN REDUCTION INTERNAL  FIXATION (ORIF) RIGHT DISTAL RADIAL FRACTURE WITH ALLOGRAFT BONE GRAFT;  Surgeon: Dominica Severin, MD;  Location: [facility];  Service: Orthopedics;  Laterality: Right;   PANCREATECTOMY N/A 02/13/2020   Procedure: LAPAROSCOPIC DISTAL PANCREATECTOMY;  Surgeon: Almond Lint, MD;  Location: [facility] OR;  Service: General;  Laterality: N/A;   TEE WITHOUT CARDIOVERSION N/A 10/18/2012   Procedure: TRANSESOPHAGEAL ECHOCARDIOGRAM (TEE);  Surgeon: Lewayne Bunting, MD;  Location: [facility] ENDOSCOPY;  Service: Cardiovascular;  Laterality: N/A;  Pre-Ablation at 12pm   TONSILLECTOMY     VAGINAL HYSTERECTOMY      Allergies  Allergen Reactions   Darvon Other (See Comments)    Severe panic attacks   Propoxyphene Other (See Comments)    GI Upset Severe panic attacks GI Upset   Propoxyphene N-Acetaminophen Other (See Comments)    Severe panic attacks   Levofloxacin Anxiety and Other (See Comments)    Causes panic attacks.    Outpatient Encounter Medications as of 05/10/2023  Medication Sig   acetaminophen (TYLENOL) 325 MG tablet Take 650 mg by mouth every 4 (four) hours as needed.   acetaminophen (TYLENOL) 500 MG tablet Take 1,000 mg by mouth every 8 (eight) hours as needed for mild pain or moderate pain.   aluminum-magnesium hydroxide 200-200 MG/5ML suspension Take 10 mLs by mouth every 4 (four) hours as needed for indigestion.   apixaban (ELIQUIS) 5 MG TABS tablet Take 1 tablet (5 mg total) by mouth in the morning and at bedtime.   bismuth subsalicylate (PEPTO BISMOL) 262 MG/15ML suspension Take 30 mLs by mouth as needed.   carbamide peroxide (DEBROX) 6.5 % OTIC solution Place 5 drops into both ears as needed.   cetirizine (ZYRTEC) 10 MG tablet Take 10 mg by mouth daily as needed for allergies.   Cranberry 425 MG CAPS Take 2 capsules by mouth at bedtime.   dextromethorphan-guaiFENesin (ROBITUSSIN-DM) 10-100 MG/5ML liquid Take 5 mLs by mouth every 4 (four) hours as needed for cough.   flecainide  (TAMBOCOR) 100 MG tablet Take 100 mg by mouth 2 (two) times daily.   Glucose (RA TRUEPLUS GLUCOSE) 15 GM/32ML GEL Give one packet by mouth as needed for low blood sugar.   Levothyroxine Sodium 25 MCG CAPS Take 1 capsule by mouth at bedtime.   LORazepam (ATIVAN) 0.5 MG tablet TAKE 1 TABLET  BY MOUTH THREE TIMES A DAY   MAGNESIUM GLYCINATE PO Give 600mg  by mouth once daily for headaches.   magnesium hydroxide (MILK OF MAGNESIA) 400 MG/5ML suspension Take by mouth as needed for mild constipation. 2 tablespoons   midodrine (PROAMATINE) 5 MG tablet Take 1 tablet (5 mg total) by mouth 3 (three) times daily with meals. Take it at 7am, 11am & 3 pm   multivitamin-iron-minerals-folic acid (THERAPEUTIC-M) TABS tablet Take 1 tablet by mouth daily.   nystatin (MYCOSTATIN/NYSTOP) powder Apply 1 Application topically 2 (two) times daily.   ondansetron (ZOFRAN) 4 MG tablet Take 4 mg by mouth 3 (three) times daily as needed  for nausea or vomiting.   OXYGEN 2lpm for dyspnea or SOB   polyethylene glycol powder (GLYCOLAX/MIRALAX) 17 GM/SCOOP powder Take 1 Container by mouth every other day. Give one scoop by mouth as needed.   pravastatin (PRAVACHOL) 10 MG tablet Take 1 tablet (10 mg total) by mouth at bedtime. Take a whole pill for pill pack and delivery   Psyllium (METAMUCIL) WAFR Take 1 Wafer by mouth as needed.   riboflavin (VITAMIN B-2) 100 MG TABS tablet Take 400 mg by mouth daily.   risedronate (ACTONEL) 150 MG tablet Take 150 mg by mouth every 30 (thirty) days. with water on empty stomach, nothing by mouth or lie down for next 30 minutes.   simethicone (MYLICON) 80 MG chewable tablet Chew 80 mg by mouth every 6 (six) hours as needed for flatulence.   timolol (TIMOPTIC) 0.5 % ophthalmic solution Place 1 drop into the left eye at bedtime.   triamcinolone cream (KENALOG) 0.1 % Apply 1 Application topically daily as needed.   No facility-administered encounter medications on file as of 05/10/2023.    Review of  Systems  Immunization History  Administered Date(s) Administered   Fluad Quad(high Dose 65+) 05/10/2019, 05/22/2020   Influenza Split 05/21/2011, 05/31/2012   Influenza Whole 06/02/2010   Influenza, High Dose Seasonal PF 05/24/2014   Influenza,inj,Quad PF,6+ Mos 04/04/2015, 05/06/2016, 05/13/2017, 05/10/2018   Influenza-Unspecified 05/20/2022   Moderna Covid-19 Fall Seasonal Vaccine 19yrs & older 11/10/2022   Moderna SARS-COV2 Booster Vaccination 12/09/2020   Moderna Sars-Covid-2 Vaccination 08/17/2019, 09/14/2019, 06/18/2020, 04/24/2021, 12/30/2021   Pneumococcal Conjugate-13 10/02/2013   Pneumococcal Polysaccharide-23 04/04/2015   Td 04/18/2008   Tdap 10/30/2015   Unspecified SARS-COV-2 Vaccination 06/12/2022   Zoster Recombinant(Shingrix) 01/27/2018, 03/13/2019   Zoster, Live 04/18/2008   Pertinent  Health Maintenance Due  Topic Date Due   INFLUENZA VACCINE  03/04/2023   Colonoscopy  03/06/2024   DEXA SCAN  Completed      12/19/2020    7:12 AM 12/26/2020   11:44 AM 02/20/2021    1:17 PM 10/18/2021    4:29 PM 12/30/2022   10:40 AM  Fall Risk  Falls in the past year?  0 0  1  Was there an injury with Fall?  0 0  0  Fall Risk Category Calculator  0 0  2  Fall Risk Category (Retired)  Low Low    (RETIRED) Patient Fall Risk Level High fall risk Low fall risk Low fall risk Low fall risk   Patient at Risk for Falls Due to     History of fall(s);Impaired balance/gait  Fall risk Follow up  Falls evaluation completed Falls evaluation completed  Education provided   Functional Status Survey:    Vitals:   05/10/23 0621  BP: (!) 112/55  Pulse: 71  Resp: 16  Temp: 98.1 F (36.7 C)  SpO2: 95%  Weight: 180 lb 4.8 oz (81.8 kg)   Body mass index is 26.63 kg/m. Physical Exam Constitutional:      Appearance: Normal appearance.  Cardiovascular:     Rate and Rhythm: Normal rate and regular rhythm.     Pulses: Normal pulses.     Heart sounds: Normal heart sounds.  Pulmonary:      Effort: Pulmonary effort is normal.  Abdominal:     General: Abdomen is flat. Bowel sounds are normal.     Palpations: Abdomen is soft.  Musculoskeletal:        General: No swelling or tenderness.  Skin:    General:  Skin is warm and dry.     Capillary Refill: Capillary refill takes 2 to 3 seconds.  Neurological:     Mental Status: She is alert and oriented to person, place, and time.  Psychiatric:        Mood and Affect: Mood normal.     Labs reviewed: Recent Labs    06/12/22 0000 01/04/23 0000  NA 135* 132*  K 4.4 4.0  CL 100 95*  CO2 30* 29*  BUN 15 15  CREATININE 0.6 0.7  CALCIUM 9.1 8.7   Recent Labs    06/12/22 0000 01/04/23 0000  AST 12* 15  ALT 17 13  ALKPHOS 70 97  ALBUMIN 4.1 4.2   Recent Labs    06/12/22 0000 01/04/23 0000  WBC 3.6 3.5  NEUTROABS 1,598.00 1,344.00  HGB 12.8 12.3  HCT 39 37  PLT 222 224   Lab Results  Component Value Date   TSH 2.98 01/04/2023   Lab Results  Component Value Date   HGBA1C 5.6 02/14/2020   Lab Results  Component Value Date   CHOL 195 06/12/2022   HDL 107 (A) 06/12/2022   LDLCALC 75 06/12/2022   LDLDIRECT 123.5 09/18/2013   TRIG 52 06/12/2022   CHOLHDL 2 07/02/2020    Significant Diagnostic Results in last 30 days:  No results found.  Assessment/Plan 1. Mild dementia associated with other underlying disease, with anxiety [facility]) Patient without any signs of acute decline at this time from memory standpoint.  She is independently ambulatory with a rolling walker.  Dresses independently show with independently since healing of her finger fracture.  Patient often denies symptoms due to memory decline as well as fear of need for transitioning to a high level of care.   2. Major depressive disorder, recurrent episode, moderate ([name]) Patient was previously on bupropion and Cymbalta for mood however these were weaned off and patient is currently on Abilify 2 mg.  At this time she states her mood is stable  we will continue to monitor.  3. Benzodiazepine dependence ([name]) Patient has not been successful with previous trials of dose reduction of Ativan.  Continue at this time.  4. History of ischemic left MCA stroke Continue Pravachol 10 mg daily  5. Orthostatic hypotension Patient started on midodrine 5 mg 3 times daily with meals and falls have decreased accordingly.  Continues compression stockings daily.  Encourage oral hydration.  6. Acquired hypothyroidism Most recent thyroid within goal continue levothyroxine 25 mcg daily.  7. Persistent atrial fibrillation ([name]) Rate well-controlled continue apixaban no signs of bleeding at this time.  8. Polypharmacy Continue medication reduction as tolerated.   9. Dysuria Patient with 1 to 2 days of dysuria.  Discussed importance of hydration.  Will collect UA and culture at this time.  Treat according to culture results.   Family/ staff Communication: nursing  Labs/tests ordered:  UA/CS ordered

## 2023-05-10 NOTE — Progress Notes (Unsigned)
Cardiology Office Note Date:  05/11/2023  Patient ID:  Monica Whitaker 29-Dec-1945, MRN [id number] PCP:  Earnestine Mealing, MD  Cardiologist:  None Electrophysiologist: Hillis Range, MD > Sherryl Manges, MD    Chief Complaint: 3mon follow-up  History of Present Illness: Monica Whitaker is a 77 y.o. female with PMH notable for afib, progressive dementia, orthostatic hypotension, recurrent falls; seen today for Sherryl Manges, MD for routine electrophysiology followup.  She is s/p afib ablation 2014 and 2020 by Dr. Johney Frame. She has ILR in place to monitor afib because of desire to stop OAC. She was previously managed on flecainide + dilt, but dilt was stopped sometime 2022 for unknown reasons.  She last saw Dr. Graciela Husbands 02/2023. BP log from facility showed BP low without increase in HR. He increased midodrine to 5mg  TID.   On follow-up today, she denies palpitations, chest pain, chest pressure. Does not think she has had any AFib episodes. She does continue to have dizziness, but no further falls. She has salt with her, and knows to have it for when she is dizzy. Does not use water bottle. She is wearing abd binder but it is uncomfortable and she does not enjoy wearing it.  She has great appetite, enjoys chips and icecream.  She denies bleeding concerns.    Device Information: MDT ILR, imp 07/2019; dx afib  AAD History: Flecainide   Past Medical History:  Diagnosis Date   Actinic keratosis 12/20/2012   left nasal tip   Anxiety    controlled with meds   Anxiety    Arrhythmia    Atrial fibrillation ([name])    paroxysmal, failed medical therapy with flecainide,  NSVT with tikosyn   Basal cell carcinoma 12/20/2012   left proximal mandible   Bruises easily    CVA (cerebral vascular accident) ([name]) 12/2007   Depression    medically controlled    Glaucoma    also with macular holes AE Dr. Dorcas Mcmurray   History of loop recorder    Hypothyroidism    Low blood pressure    no falls, but  if gets up to fast   RLS (restless legs syndrome)    Stroke ([name]) 7 years ago   Lasting effects on balance, different personality.   Thyroid disease    Tremor    Ventricular tachycardia ([name])    pt told at [facility] that she had RVOT VT    Past Surgical History:  Procedure Laterality Date   ATRIAL FIBRILLATION ABLATION  10/18/12   PVI by Dr Johney Frame   ATRIAL FIBRILLATION ABLATION N/A 10/18/2012   Procedure: ATRIAL FIBRILLATION ABLATION;  Surgeon: Hillis Range, MD;  Location: [facility] CATH LAB;  Service: Cardiovascular;  Laterality: N/A;   ATRIAL FIBRILLATION ABLATION N/A 04/18/2019   Procedure: ATRIAL FIBRILLATION ABLATION;  Surgeon: Hillis Range, MD;  Location: [facility] INVASIVE CV LAB;  Service: Cardiovascular;  Laterality: N/A;   EUS N/A 10/19/2019   Procedure: FULL UPPER ENDOSCOPIC ULTRASOUND (EUS) RADIAL;  Surgeon: Bearl Mulberry, MD;  Location: [facility] ENDOSCOPY;  Service: Gastroenterology;  Laterality: N/A;   EYE SURGERY     on L/R eye, macular hole    FRACTURE SURGERY     implantable loop recorder placement  07/13/2019   MDT Reveal LINQ1 [facility] [id number]) implanted in office by Dr Johney Frame for afib management post ablation   OPEN REDUCTION INTERNAL FIXATION (ORIF) DISTAL RADIAL FRACTURE Right 11/15/2014   Procedure: OPEN REDUCTION INTERNAL FIXATION (ORIF) RIGHT DISTAL RADIAL FRACTURE WITH ALLOGRAFT BONE GRAFT;  Surgeon: Dominica Severin, MD;  Location: [facility];  Service: Orthopedics;  Laterality: Right;   PANCREATECTOMY N/A 02/13/2020   Procedure: LAPAROSCOPIC DISTAL PANCREATECTOMY;  Surgeon: Almond Lint, MD;  Location: [facility] OR;  Service: General;  Laterality: N/A;   TEE WITHOUT CARDIOVERSION N/A 10/18/2012   Procedure: TRANSESOPHAGEAL ECHOCARDIOGRAM (TEE);  Surgeon: Lewayne Bunting, MD;  Location: [facility] ENDOSCOPY;  Service: Cardiovascular;  Laterality: N/A;  Pre-Ablation at 12pm   TONSILLECTOMY     VAGINAL HYSTERECTOMY      Current Outpatient Medications  Medication Instructions    acetaminophen (TYLENOL) 1,000 mg, Oral, Every 8 hours PRN   acetaminophen (TYLENOL) 650 mg, Oral, Every 4 hours PRN   aluminum-magnesium hydroxide 200-200 MG/5ML suspension 10 mLs, Oral, Every 4 hours PRN   ARIPiprazole (ABILIFY) 2 mg, Oral, Daily   bismuth subsalicylate (PEPTO BISMOL) 262 MG/15ML suspension 30 mLs, Oral, As needed   carbamide peroxide (DEBROX) 6.5 % OTIC solution 5 drops, Both EARS, As needed   cetirizine (ZYRTEC) 10 mg, Oral, Daily PRN   Cranberry 425 MG CAPS 2 capsules, Oral, Daily at bedtime   dextromethorphan-guaiFENesin (ROBITUSSIN-DM) 10-100 MG/5ML liquid 5 mLs, Oral, Every 4 hours PRN   Eliquis 5 mg, Oral, 2 times daily   flecainide (TAMBOCOR) 100 mg, Oral, 2 times daily   gabapentin (NEURONTIN) 200 mg, Oral, 2 times daily   Levothyroxine Sodium 25 MCG CAPS 1 capsule, Oral, Daily at bedtime   LORazepam (ATIVAN) 0.5 mg, Oral, 3 times daily   MAGNESIUM GLYCINATE PO Give 600mg  by mouth once daily for headaches.    magnesium hydroxide (MILK OF MAGNESIA) 400 MG/5ML suspension Oral, As needed, 2 tablespoons   midodrine (PROAMATINE) 5 mg, Oral, 3 times daily with meals, Take it at 7am, 11am & 3 pm   multivitamin-iron-minerals-folic acid (THERAPEUTIC-M) TABS tablet 1 tablet, Oral, Daily   ondansetron (ZOFRAN) 4 mg, Oral, 3 times daily PRN   polyethylene glycol powder (GLYCOLAX/MIRALAX) 17 GM/SCOOP powder 1 Container, Oral, Every other day, Give one scoop by mouth as needed.    pravastatin (PRAVACHOL) 10 mg, Oral, Daily at bedtime, Take a whole pill for pill pack and delivery   Psyllium (METAMUCIL) WAFR 1 Wafer, Oral, As needed   risedronate (ACTONEL) 150 mg, Oral, Every 30 days, with water on empty stomach, nothing by mouth or lie down for next 30 minutes.   simethicone (MYLICON) 80 mg, Oral, Every 6 hours PRN   sodium chloride 4 g, Oral, Daily   timolol (TIMOPTIC) 0.5 % ophthalmic solution 1 drop, Left Eye, Daily at bedtime   vitamin B-12 (CYANOCOBALAMIN) 400 mcg, Oral,  Daily    Social History:  The patient  reports that she has never smoked. She has never used smokeless tobacco. She reports current alcohol use of about 2.0 standard drinks of alcohol per week. She reports that she does not use drugs.   Family History:  The patient's family history includes Multiple sclerosis in her father.  ROS:  Please see the history of present illness. All other systems are reviewed and otherwise negative.   PHYSICAL EXAM:  VS:  BP 100/60 (BP Location: Right Arm, Patient Position: Sitting, Cuff Size: Normal)   Pulse (!) 59   Ht 5\' 9"  (1.753 m)   Wt 185 lb (83.9 kg)   SpO2 96%   BMI 27.32 kg/m  BMI: Body mass index is 27.32 kg/m.   GEN- The patient is well appearing, alert and oriented x 3 today.   Lungs- Clear to ausculation bilaterally,  normal work of breathing.  Heart- Regular rate and rhythm, no murmurs, rubs or gallops Extremities- 1-2+ peripheral edema, warm, dry Skin-  ILR insertion site well-healed  Device interrogation done today and reviewed by myself:  Battery at ERI R-wave 0.24 Freq AFib episodes - all appear to be SR with freq PAC   EKG is ordered. Personal review of EKG from today shows:   EKG Interpretation Date/Time:  Tuesday May 11 2023 10:08:27 EDT Ventricular Rate:  59 PR Interval:  202 QRS Duration:  92 QT Interval:  402 QTC Calculation: 397 R Axis:   78  Text Interpretation: Sinus bradycardia with sinus arrhythmia with 1st degree A-V block Rightward axis Incomplete left bundle branch block Reconfirmed by Sherie Don [phone]) on 05/11/2023 10:51:51 AM    02/08/2023 - SR, rate 72bpm with 1st deg HB RAD, Incomplete LBBB    Recent Labs: 01/04/2023: ALT 13; BUN 15; Creatinine 0.7; Hemoglobin 12.3; Platelets 224; Potassium 4.0; Sodium 132; TSH 2.98  06/12/2022: Cholesterol 195; HDL 107; LDL Cholesterol 75; Triglycerides 52   CrCl cannot be calculated (Patient's most recent lab result is older than the maximum 21 days allowed.).    Wt Readings from Last 3 Encounters:  05/11/23 185 lb (83.9 kg)  05/10/23 180 lb 4.8 oz (81.8 kg)  02/19/23 169 lb 11.2 oz (77 kg)     Additional studies reviewed include: Previous EP, cardiology notes.   TTE, 12/17/2020  1. Left ventricular ejection fraction, by estimation, is 55 to 60%. The left ventricle has normal function. The left ventricle has no regional wall motion abnormalities. Left ventricular diastolic parameters were normal.   2. Right ventricular systolic function is normal. The right ventricular size is normal. Tricuspid regurgitation signal is inadequate for assessing PA pressure.   3. The mitral valve is degenerative. Trivial mitral valve regurgitation. No evidence of mitral stenosis.   4. The aortic valve is tricuspid. There is mild thickening of the aortic valve. Aortic valve regurgitation is not visualized. Mild aortic valve sclerosis is present, with no evidence of aortic valve stenosis.   5. Aortic dilatation noted. There is mild dilatation of the aortic root, measuring 39 mm. There is mild dilatation of the ascending aorta, measuring 37 mm.   6. The inferior vena cava is dilated in size with <50% respiratory  variability, suggesting right atrial pressure of 15 mmHg.   ASSESSMENT AND PLAN:  #) parox AFib S/p ablation x 2 ILR "afib" recordings all appear to be sinus with PACs ILR at end of service, patient is inclined to leave in place. Will discuss with Dr. Graciela Husbands EKG with stable intervals Continue flecainide 100mg  BID  #) Hypercoag d/t parox afib CHA2DS2-VASc Score = 5 [CHF History: 0, HTN History: 0, Diabetes History: 0, Stroke History: 2, Vascular Disease History: 0, Age Score: 2, Gender Score: 1].  Therefore, the patient's annual risk of stroke is 7.2 %.    Stroke ppx - eliquis 5mg  BID, appropriately dosed No bleeding concerns  #) orthostatic hypotension #) ?dysautonomia No further falls No vital signs provided from facility Borderline BP in office  today, patient asymptomatic Recommended continue to use abd binder Increase sodium intake - suggested eating 1 bag of chips daily Increase fluid intake - bring water bottle everywhere and drink it Continue midodrine 5mg  at 7a, 11a, 3p Will call facility to obtain BP readings      Current medicines are reviewed at length with the patient today.   The patient does not have concerns regarding her  medicines.  The following changes were made today:  none  Labs/ tests ordered today include:  Orders Placed This Encounter  Procedures   EKG 12-Lead     Disposition: Follow up with Dr. Graciela Husbands or EP APP in 3 months   Signed, Sherie Don, NP  05/11/23  11:35 AM  Electrophysiology [facility]

## 2023-05-11 ENCOUNTER — Encounter: Payer: Self-pay | Admitting: Cardiology

## 2023-05-11 ENCOUNTER — Telehealth: Payer: Self-pay | Admitting: *Deleted

## 2023-05-11 ENCOUNTER — Ambulatory Visit: Payer: PPO | Attending: Cardiology | Admitting: Cardiology

## 2023-05-11 VITALS — BP 100/60 | HR 59 | Ht 69.0 in | Wt 185.0 lb

## 2023-05-11 DIAGNOSIS — R296 Repeated falls: Secondary | ICD-10-CM | POA: Diagnosis not present

## 2023-05-11 DIAGNOSIS — I480 Paroxysmal atrial fibrillation: Secondary | ICD-10-CM | POA: Diagnosis not present

## 2023-05-11 DIAGNOSIS — R300 Dysuria: Secondary | ICD-10-CM | POA: Diagnosis not present

## 2023-05-11 LAB — CUP PACEART INCLINIC DEVICE CHECK
Date Time Interrogation Session: 20241008154606
Implantable Pulse Generator Implant Date: 20201210

## 2023-05-11 NOTE — Telephone Encounter (Signed)
I reached out to pt's facility to request Blood pressure reading log history. I LVM to pt's clinical care nurse Grandville Silos, RN [phone] to return my call to request this information.

## 2023-05-11 NOTE — Patient Instructions (Signed)
Medication Instructions:   Your physician recommends that you continue on your current medications as directed. Please refer to the Current Medication list given to you today.  *If you need a refill on your cardiac medications before your next appointment, please call your pharmacy*   Lab Work: NONE If you have labs (blood work) drawn today and your tests are completely normal, you will receive your results only by: MyChart Message (if you have MyChart) OR A paper copy in the mail If you have any lab test that is abnormal or we need to change your treatment, we will call you to review the results.   Testing/Procedures: NONE   Follow-Up: At [facility], you and your health needs are our priority.  As part of our continuing mission to provide you with exceptional heart care, we have created designated Provider Care Teams.  These Care Teams include your primary Cardiologist (physician) and Advanced Practice Providers (APPs -  Physician Assistants and Nurse Practitioners) who all work together to provide you with the care you need, when you need it.  We recommend signing up for the patient portal called "MyChart".  Sign up information is provided on this After Visit Summary.  MyChart is used to connect with patients for Virtual Visits (Telemedicine).  Patients are able to view lab/test results, encounter notes, upcoming appointments, etc.  Non-urgent messages can be sent to your provider as well.   To learn more about what you can do with MyChart, go to [email or web address].    Your next appointment:   3 month(s)  Provider:   Sherryl Manges, MD

## 2023-05-12 NOTE — Telephone Encounter (Signed)
Called facility to get blood pressure logs.  LVM to call back, left call back number.

## 2023-05-14 NOTE — Telephone Encounter (Signed)
Peter returned my call, and emailed BP/pulse log   BP readings appear improved since dose increase in midodrine 5 mg TID.

## 2023-05-14 NOTE — Telephone Encounter (Signed)
I attempted to reach clinical personnel at [facility] - [phone] And  Felisa Bonier - [phone]  No answer on either, left VM requesting a return call

## 2023-05-17 DIAGNOSIS — Z961 Presence of intraocular lens: Secondary | ICD-10-CM | POA: Diagnosis not present

## 2023-05-17 DIAGNOSIS — H40003 Preglaucoma, unspecified, bilateral: Secondary | ICD-10-CM | POA: Diagnosis not present

## 2023-05-24 DIAGNOSIS — H40052 Ocular hypertension, left eye: Secondary | ICD-10-CM | POA: Diagnosis not present

## 2023-06-02 ENCOUNTER — Telehealth: Payer: Self-pay | Admitting: Student

## 2023-06-02 DIAGNOSIS — F1320 Sedative, hypnotic or anxiolytic dependence, uncomplicated: Secondary | ICD-10-CM

## 2023-06-02 DIAGNOSIS — F331 Major depressive disorder, recurrent, moderate: Secondary | ICD-10-CM

## 2023-06-02 MED ORDER — ESCITALOPRAM OXALATE 10 MG PO TABS
10.0000 mg | ORAL_TABLET | Freq: Every day | ORAL | 3 refills | Status: DC
Start: 2023-06-02 — End: 2024-03-16

## 2023-06-02 NOTE — Telephone Encounter (Signed)
This has not been the case as she has been spending more and more time in her room alone focusing on negative events in her past and feeling down about not being able to do all the things she use to do.  She also states that she is not sleeping well at night. When we started her on Abilify we discontinued her Duloxetine. I think the Abilify has helped but she may need something more to help with the depression. We also discontinued her Trazadone.  She gets sad about what she used to do and can no longer do. Previously on wellbutrin and Cymbalta for her depression and anxiety. She has been on abilify to augment therapy. Will start lexapro 5 mg x5 days then increase to 10 mg daily. F/u 1 mo.

## 2023-06-08 DIAGNOSIS — Z9181 History of falling: Secondary | ICD-10-CM | POA: Diagnosis not present

## 2023-06-08 DIAGNOSIS — R4189 Other symptoms and signs involving cognitive functions and awareness: Secondary | ICD-10-CM | POA: Diagnosis not present

## 2023-06-08 DIAGNOSIS — R278 Other lack of coordination: Secondary | ICD-10-CM | POA: Diagnosis not present

## 2023-06-08 DIAGNOSIS — S92355D Nondisplaced fracture of fifth metatarsal bone, left foot, subsequent encounter for fracture with routine healing: Secondary | ICD-10-CM | POA: Diagnosis not present

## 2023-06-08 DIAGNOSIS — W19XXXD Unspecified fall, subsequent encounter: Secondary | ICD-10-CM | POA: Diagnosis not present

## 2023-06-08 DIAGNOSIS — E039 Hypothyroidism, unspecified: Secondary | ICD-10-CM | POA: Diagnosis not present

## 2023-06-08 DIAGNOSIS — S82301D Unspecified fracture of lower end of right tibia, subsequent encounter for closed fracture with routine healing: Secondary | ICD-10-CM | POA: Diagnosis not present

## 2023-06-08 DIAGNOSIS — S8264XD Nondisplaced fracture of lateral malleolus of right fibula, subsequent encounter for closed fracture with routine healing: Secondary | ICD-10-CM | POA: Diagnosis not present

## 2023-06-08 DIAGNOSIS — G3184 Mild cognitive impairment, so stated: Secondary | ICD-10-CM | POA: Diagnosis not present

## 2023-06-08 DIAGNOSIS — M6281 Muscle weakness (generalized): Secondary | ICD-10-CM | POA: Diagnosis not present

## 2023-06-08 DIAGNOSIS — G2581 Restless legs syndrome: Secondary | ICD-10-CM | POA: Diagnosis not present

## 2023-06-08 DIAGNOSIS — S82201D Unspecified fracture of shaft of right tibia, subsequent encounter for closed fracture with routine healing: Secondary | ICD-10-CM | POA: Diagnosis not present

## 2023-06-08 DIAGNOSIS — Z8673 Personal history of transient ischemic attack (TIA), and cerebral infarction without residual deficits: Secondary | ICD-10-CM | POA: Diagnosis not present

## 2023-06-08 DIAGNOSIS — R2689 Other abnormalities of gait and mobility: Secondary | ICD-10-CM | POA: Diagnosis not present

## 2023-06-08 DIAGNOSIS — Z741 Need for assistance with personal care: Secondary | ICD-10-CM | POA: Diagnosis not present

## 2023-06-08 DIAGNOSIS — F329 Major depressive disorder, single episode, unspecified: Secondary | ICD-10-CM | POA: Diagnosis not present

## 2023-06-08 DIAGNOSIS — F419 Anxiety disorder, unspecified: Secondary | ICD-10-CM | POA: Diagnosis not present

## 2023-06-08 DIAGNOSIS — I482 Chronic atrial fibrillation, unspecified: Secondary | ICD-10-CM | POA: Diagnosis not present

## 2023-07-30 DIAGNOSIS — I480 Paroxysmal atrial fibrillation: Secondary | ICD-10-CM | POA: Insufficient documentation

## 2023-07-30 DIAGNOSIS — Z9889 Other specified postprocedural states: Secondary | ICD-10-CM | POA: Insufficient documentation

## 2023-07-30 DIAGNOSIS — I951 Orthostatic hypotension: Secondary | ICD-10-CM | POA: Insufficient documentation

## 2023-08-03 ENCOUNTER — Ambulatory Visit: Payer: PPO | Attending: Internal Medicine | Admitting: Internal Medicine

## 2023-08-03 DIAGNOSIS — I480 Paroxysmal atrial fibrillation: Secondary | ICD-10-CM

## 2023-08-03 DIAGNOSIS — I951 Orthostatic hypotension: Secondary | ICD-10-CM

## 2023-08-03 DIAGNOSIS — Z9889 Other specified postprocedural states: Secondary | ICD-10-CM

## 2023-08-05 ENCOUNTER — Encounter: Payer: Self-pay | Admitting: Internal Medicine

## 2023-08-12 DIAGNOSIS — H903 Sensorineural hearing loss, bilateral: Secondary | ICD-10-CM | POA: Diagnosis not present

## 2023-08-12 DIAGNOSIS — T162XXA Foreign body in left ear, initial encounter: Secondary | ICD-10-CM | POA: Diagnosis not present

## 2023-08-18 DIAGNOSIS — I7091 Generalized atherosclerosis: Secondary | ICD-10-CM | POA: Diagnosis not present

## 2023-08-18 DIAGNOSIS — B351 Tinea unguium: Secondary | ICD-10-CM | POA: Diagnosis not present

## 2023-08-19 ENCOUNTER — Encounter: Payer: Self-pay | Admitting: Nurse Practitioner

## 2023-08-19 ENCOUNTER — Non-Acute Institutional Stay: Payer: PPO | Admitting: Nurse Practitioner

## 2023-08-19 DIAGNOSIS — L814 Other melanin hyperpigmentation: Secondary | ICD-10-CM | POA: Diagnosis not present

## 2023-08-19 DIAGNOSIS — F39 Unspecified mood [affective] disorder: Secondary | ICD-10-CM

## 2023-08-19 DIAGNOSIS — L821 Other seborrheic keratosis: Secondary | ICD-10-CM | POA: Diagnosis not present

## 2023-08-19 DIAGNOSIS — I480 Paroxysmal atrial fibrillation: Secondary | ICD-10-CM | POA: Diagnosis not present

## 2023-08-19 DIAGNOSIS — E782 Mixed hyperlipidemia: Secondary | ICD-10-CM | POA: Diagnosis not present

## 2023-08-19 DIAGNOSIS — F02A4 Dementia in other diseases classified elsewhere, mild, with anxiety: Secondary | ICD-10-CM | POA: Diagnosis not present

## 2023-08-19 DIAGNOSIS — F1320 Sedative, hypnotic or anxiolytic dependence, uncomplicated: Secondary | ICD-10-CM | POA: Diagnosis not present

## 2023-08-19 DIAGNOSIS — E039 Hypothyroidism, unspecified: Secondary | ICD-10-CM | POA: Diagnosis not present

## 2023-08-19 DIAGNOSIS — M810 Age-related osteoporosis without current pathological fracture: Secondary | ICD-10-CM

## 2023-08-19 DIAGNOSIS — L570 Actinic keratosis: Secondary | ICD-10-CM | POA: Diagnosis not present

## 2023-08-19 DIAGNOSIS — I951 Orthostatic hypotension: Secondary | ICD-10-CM

## 2023-08-19 NOTE — Progress Notes (Signed)
Location:  Other [place].  Nursing Home Room Number: [address] Place of Service:  ALF [phone]) Abbey Chatters, NP  PCP: Earnestine Mealing, MD  Patient Care Team: Earnestine Mealing, MD as PCP - General (Family Medicine) Duke Salvia, MD as PCP - Electrophysiology (Cardiology) Newman Nip, NP (Inactive) as Nurse Practitioner (Nurse Practitioner) Hillis Range, MD (Inactive) as Consulting Physician (Cardiology) Dominica Severin, MD as Consulting Physician (Orthopedic Surgery) Elnita Maxwell, MD as Referring Physician (Gastroenterology) Sallee Lange, MD as Consulting Physician (Ophthalmology) Sharon Seller, NP as Nurse Practitioner (Geriatric Medicine)  Extended Emergency Contact Information Primary Emergency Contact: [name],[name] Address: [address]          [place], [place] [address] [place] Phone: [phone] Relation: Relative Secondary Emergency Contact: Kirstie Mirza Address: [address] [place], [place] [address] [place] Work Phone: [phone] Relation: Other  Goals of care: Advanced Directive information    08/19/2023   12:49 PM  Advanced Directives  Does Patient Have a Medical Advance Directive? Yes  Type of Estate agent of [place];Living will  Does patient want to make changes to medical advance directive? No - Patient declined  Copy of Healthcare Power of Attorney in Chart? Yes - validated most recent copy scanned in chart (See row information)     Chief Complaint  Patient presents with   Medical Management of Chronic Issues    Medical Management of Chronic Issues.     HPI:  Pt is a 78 y.o. female seen today for medical management of chronic disease.   Reports she feels like she weighs too much. Usually keeps up wit her weight "Spent my whole life trying to be a lower weight"  BMI 28   Reports she always a little feels depressed- feels like  this is her normal.  Does not have "a good friend"  Reports she continues to be in her favor place and she loves that Tries to look into seeing her friend but cost too much. Tried to see her but was not able to do it.  Got depressed when she was not able to do that.  Does go down stairs for meals and get togethers.  She reports she does exercise.  Has signed up for more exercises but then felt overwhelmed    Past Medical History:  Diagnosis Date   Actinic keratosis 12/20/2012   left nasal tip   Anxiety    controlled with meds   Anxiety    Arrhythmia    Atrial fibrillation ([name])    paroxysmal, failed medical therapy with flecainide,  NSVT with tikosyn   Basal cell carcinoma 12/20/2012   left proximal mandible   Bruises easily    CVA (cerebral vascular accident) ([name]) 12/2007   Depression    medically controlled    Glaucoma    also with macular holes AE Dr. Dorcas Mcmurray   History of loop recorder    Hypothyroidism    Low blood pressure    no falls, but if gets up to fast   RLS (restless legs syndrome)    Stroke ([name]) 7 years ago   Lasting effects on balance, different personality.   Thyroid disease    Tremor    Ventricular tachycardia ([name])    pt told at [facility] that she had RVOT VT   Past Surgical History:  Procedure Laterality Date   ATRIAL FIBRILLATION ABLATION  10/18/12  PVI by Dr Johney Frame   ATRIAL FIBRILLATION ABLATION N/A 10/18/2012   Procedure: ATRIAL FIBRILLATION ABLATION;  Surgeon: Hillis Range, MD;  Location: [facility] CATH LAB;  Service: Cardiovascular;  Laterality: N/A;   ATRIAL FIBRILLATION ABLATION N/A 04/18/2019   Procedure: ATRIAL FIBRILLATION ABLATION;  Surgeon: Hillis Range, MD;  Location: [facility] INVASIVE CV LAB;  Service: Cardiovascular;  Laterality: N/A;   EUS N/A 10/19/2019   Procedure: FULL UPPER ENDOSCOPIC ULTRASOUND (EUS) RADIAL;  Surgeon: Bearl Mulberry, MD;  Location: [facility] ENDOSCOPY;  Service: Gastroenterology;  Laterality: N/A;   EYE SURGERY     on L/R  eye, macular hole    FRACTURE SURGERY     implantable loop recorder placement  07/13/2019   MDT Reveal LINQ1 [facility] [id number]) implanted in office by Dr Johney Frame for afib management post ablation   OPEN REDUCTION INTERNAL FIXATION (ORIF) DISTAL RADIAL FRACTURE Right 11/15/2014   Procedure: OPEN REDUCTION INTERNAL FIXATION (ORIF) RIGHT DISTAL RADIAL FRACTURE WITH ALLOGRAFT BONE GRAFT;  Surgeon: Dominica Severin, MD;  Location: [facility];  Service: Orthopedics;  Laterality: Right;   PANCREATECTOMY N/A 02/13/2020   Procedure: LAPAROSCOPIC DISTAL PANCREATECTOMY;  Surgeon: Almond Lint, MD;  Location: [facility] OR;  Service: General;  Laterality: N/A;   TEE WITHOUT CARDIOVERSION N/A 10/18/2012   Procedure: TRANSESOPHAGEAL ECHOCARDIOGRAM (TEE);  Surgeon: Lewayne Bunting, MD;  Location: [facility] ENDOSCOPY;  Service: Cardiovascular;  Laterality: N/A;  Pre-Ablation at 12pm   TONSILLECTOMY     VAGINAL HYSTERECTOMY      Allergies  Allergen Reactions   Darvon Other (See Comments)    Severe panic attacks   Propoxyphene Other (See Comments)    GI Upset Severe panic attacks GI Upset   Propoxyphene N-Acetaminophen Other (See Comments)    Severe panic attacks   Levofloxacin Anxiety and Other (See Comments)    Causes panic attacks.    Outpatient Encounter Medications as of 08/19/2023  Medication Sig   acetaminophen (TYLENOL) 325 MG tablet Take 650 mg by mouth every 4 (four) hours as needed.   acetaminophen (TYLENOL) 500 MG tablet Take 1,000 mg by mouth 2 (two) times daily.   aluminum-magnesium hydroxide 200-200 MG/5ML suspension Take 10 mLs by mouth every 4 (four) hours as needed for indigestion.   apixaban (ELIQUIS) 5 MG TABS tablet Take 1 tablet (5 mg total) by mouth in the morning and at bedtime.   ARIPiprazole (ABILIFY) 2 MG tablet Take 2 mg by mouth daily.   bismuth subsalicylate (PEPTO BISMOL) 262 MG/15ML suspension Take 30 mLs by mouth as needed.   carbamide peroxide (DEBROX) 6.5 % OTIC solution  Place 5 drops into both ears as needed.   cetirizine (ZYRTEC) 10 MG tablet Take 10 mg by mouth daily as needed for allergies.   Cranberry 425 MG CAPS Take 2 capsules by mouth at bedtime.   dextromethorphan-guaiFENesin (ROBITUSSIN-DM) 10-100 MG/5ML liquid Take 5 mLs by mouth every 4 (four) hours as needed for cough.   escitalopram (LEXAPRO) 10 MG tablet Take 1 tablet (10 mg total) by mouth daily.   flecainide (TAMBOCOR) 100 MG tablet Take 100 mg by mouth 2 (two) times daily.   gabapentin (NEURONTIN) 100 MG capsule Take 200 mg by mouth 2 (two) times daily.   Glucose 15 GM/32ML GEL Give one packet by mouth as needed.   Levothyroxine Sodium 25 MCG CAPS Take 1 capsule by mouth at bedtime.   LORazepam (ATIVAN) 0.5 MG tablet TAKE 1 TABLET  BY MOUTH THREE TIMES A DAY   MAGNESIUM GLYCINATE PO Give  600mg  by mouth once daily for headaches.   magnesium hydroxide (MILK OF MAGNESIA) 400 MG/5ML suspension Take by mouth as needed for mild constipation. 2 tablespoons   melatonin 3 MG TABS tablet Take 3 mg by mouth at bedtime.   midodrine (PROAMATINE) 5 MG tablet Take 1 tablet (5 mg total) by mouth 3 (three) times daily with meals. Take it at 7am, 11am & 3 pm   multivitamin-iron-minerals-folic acid (THERAPEUTIC-M) TABS tablet Take 1 tablet by mouth daily.   nystatin (MYCOSTATIN/NYSTOP) powder Apply 1 Application topically 2 (two) times daily.   olopatadine (PATADAY) 0.1 % ophthalmic solution Place 1 drop into both eyes daily.   ondansetron (ZOFRAN) 4 MG tablet Take 4 mg by mouth 3 (three) times daily as needed for nausea or vomiting.   OXYGEN 2lmp for dyspnea or SOB   polyethylene glycol powder (GLYCOLAX/MIRALAX) 17 GM/SCOOP powder Take 1 Container by mouth every other day. Give one scoop by mouth as needed.   pravastatin (PRAVACHOL) 10 MG tablet Take 1 tablet (10 mg total) by mouth at bedtime. Take a whole pill for pill pack and delivery   Psyllium (METAMUCIL) WAFR Take 1 Wafer by mouth as needed.   risedronate  (ACTONEL) 150 MG tablet Take 150 mg by mouth every 30 (thirty) days. with water on empty stomach, nothing by mouth or lie down for next 30 minutes.   Simethicone (GAS-X PO) Give one capsule by mouth as needed after meals and at bedtime.   sodium chloride 1 g tablet Take 4 g by mouth daily at 12 noon.   timolol (TIMOPTIC) 0.5 % ophthalmic solution Place 1 drop into the left eye at bedtime.   triamcinolone cream (KENALOG) 0.1 % Apply 1 Application topically daily as needed.   vitamin B-12 (CYANOCOBALAMIN) 100 MCG tablet Take 400 mcg by mouth daily.   [DISCONTINUED] simethicone (MYLICON) 80 MG chewable tablet Chew 80 mg by mouth every 6 (six) hours as needed for flatulence.   No facility-administered encounter medications on file as of 08/19/2023.    Review of Systems  Constitutional:  Negative for activity change, appetite change, fatigue and unexpected weight change.  HENT:  Negative for congestion and hearing loss.   Eyes: Negative.   Respiratory:  Negative for cough and shortness of breath.   Cardiovascular:  Negative for chest pain, palpitations and leg swelling.  Gastrointestinal:  Negative for abdominal pain, constipation and diarrhea.  Genitourinary:  Negative for difficulty urinating and dysuria.  Musculoskeletal:  Negative for arthralgias and myalgias.  Skin:  Negative for color change and wound.  Neurological:  Negative for dizziness and weakness.  Psychiatric/Behavioral:  Negative for agitation, behavioral problems and confusion.        Reports some depression      Immunization History  Administered Date(s) Administered   Fluad Quad(high Dose 65+) 05/10/2019, 05/22/2020   Influenza Split 05/21/2011, 05/31/2012   Influenza Whole 06/02/2010   Influenza, High Dose Seasonal PF 05/24/2014   Influenza,inj,Quad PF,6+ Mos 04/04/2015, 05/06/2016, 05/13/2017, 05/10/2018   Influenza-Unspecified 05/20/2022, 05/21/2023   Moderna Covid-19 Fall Seasonal Vaccine 62yrs & older 11/10/2022    Moderna SARS-COV2 Booster Vaccination 12/09/2020   Moderna Sars-Covid-2 Vaccination 08/17/2019, 09/14/2019, 06/18/2020, 04/24/2021, 12/30/2021   Pneumococcal Conjugate-13 10/02/2013   Pneumococcal Polysaccharide-23 04/04/2015   Td 04/18/2008   Tdap 10/30/2015   Unspecified SARS-COV-2 Vaccination 06/12/2022, 04/30/2023   Zoster Recombinant(Shingrix) 01/27/2018, 03/13/2019   Zoster, Live 04/18/2008   Pertinent  Health Maintenance Due  Topic Date Due   INFLUENZA VACCINE  Completed  DEXA SCAN  Completed   Colonoscopy  Discontinued      12/19/2020    7:12 AM 12/26/2020   11:44 AM 02/20/2021    1:17 PM 10/18/2021    4:29 PM 12/30/2022   10:40 AM  Fall Risk  Falls in the past year?  0 0  1  Was there an injury with Fall?  0 0  0  Fall Risk Category Calculator  0 0  2  Fall Risk Category (Retired)  Low Low    (RETIRED) Patient Fall Risk Level High fall risk Low fall risk Low fall risk Low fall risk   Patient at Risk for Falls Due to     History of fall(s);Impaired balance/gait  Fall risk Follow up  Falls evaluation completed Falls evaluation completed  Education provided   Functional Status Survey:    Vitals:   08/19/23 1229  BP: 101/63  Pulse: (!) 57  Resp: 15  Temp: (!) 97.3 F (36.3 C)  SpO2: 93%  Weight: 195 lb 1.6 oz (88.5 kg)  Height: 5\' 9"  (1.753 m)   Body mass index is 28.81 kg/m. Physical Exam Constitutional:      General: She is not in acute distress.    Appearance: She is well-developed. She is not diaphoretic.  HENT:     Head: Normocephalic and atraumatic.     Mouth/Throat:     Pharynx: No oropharyngeal exudate.  Eyes:     Conjunctiva/sclera: Conjunctivae normal.     Pupils: Pupils are equal, round, and reactive to light.  Cardiovascular:     Rate and Rhythm: Normal rate and regular rhythm.     Heart sounds: Normal heart sounds.  Pulmonary:     Effort: Pulmonary effort is normal.     Breath sounds: Normal breath sounds.  Abdominal:     General: Bowel  sounds are normal.     Palpations: Abdomen is soft.  Musculoskeletal:     Cervical back: Normal range of motion and neck supple.     Right lower leg: No edema.     Left lower leg: No edema.  Skin:    General: Skin is warm and dry.  Neurological:     Mental Status: She is alert.  Psychiatric:        Mood and Affect: Mood normal.     Labs reviewed: Recent Labs    01/04/23 0000 03/18/23 0000  NA 132* 141  K 4.0 4.8  CL 95* 106  CO2 29* 27*  BUN 15 24*  CREATININE 0.7 0.8  CALCIUM 8.7 8.8   Recent Labs    01/04/23 0000  AST 15  ALT 13  ALKPHOS 97  ALBUMIN 4.2   Recent Labs    01/04/23 0000  WBC 3.5  NEUTROABS 1,344.00  HGB 12.3  HCT 37  PLT 224   Lab Results  Component Value Date   TSH 2.98 01/04/2023   Lab Results  Component Value Date   HGBA1C 5.6 02/14/2020   Lab Results  Component Value Date   CHOL 195 06/12/2022   HDL 107 (A) 06/12/2022   LDLCALC 75 06/12/2022   LDLDIRECT 123.5 09/18/2013   TRIG 52 06/12/2022   CHOLHDL 2 07/02/2020    Significant Diagnostic Results in last 30 days:  No results found.  Assessment/Plan 1. Mixed hyperlipidemia (Primary) -continues on pravastatin follow up lipid  2. Mild dementia associated with other underlying disease, with anxiety ([name]) -Stable, no acute changes in cognitive or functional status, continue supportive care.  3. Benzodiazepine dependence ([name]) Unable to tolerate ativan being titrated down, continue current dose, will monitor  4. Orthostatic hypotension Stable, continues on midodrine  5. Acquired hypothyroidism Continues on levothyroxine 25 mcg, tsh at goal in June  6. Paroxysmal atrial fibrillation ([name]) Rate controlled on flecaine   Continues on elquis for anticoaguation  7. Mood disorder ([name]) Reports depression, worse at times. She is now getting involved with more activies and exercises so this will benefit mood. Will continue current regimen.   8. Age-related osteoporosis  without current pathological fracture Continues on actonel monthly   Kalley Nicholl K. Biagio Borg [facility] [phone]

## 2023-08-23 DIAGNOSIS — I480 Paroxysmal atrial fibrillation: Secondary | ICD-10-CM | POA: Diagnosis not present

## 2023-08-23 DIAGNOSIS — E782 Mixed hyperlipidemia: Secondary | ICD-10-CM | POA: Diagnosis not present

## 2023-08-24 LAB — COMPREHENSIVE METABOLIC PANEL WITH GFR
Albumin: 4.4 (ref 3.5–5.0)
Calcium: 9.2 (ref 8.7–10.7)
Globulin: 2.7

## 2023-08-24 LAB — LIPID PANEL
Cholesterol: 202 — AB (ref 0–200)
HDL: 105 — AB (ref 35–70)
Triglycerides: 82 (ref 40–160)

## 2023-08-24 LAB — CBC AND DIFFERENTIAL
HCT: 42 (ref 36–46)
Hemoglobin: 13.8 (ref 12.0–16.0)
Neutrophils Absolute: 2407
Platelets: 214 10*3/uL (ref 150–400)
WBC: 5.1

## 2023-08-24 LAB — HEPATIC FUNCTION PANEL
ALT: 13 U/L (ref 7–35)
AST: 17 (ref 13–35)
Alkaline Phosphatase: 64 (ref 25–125)
Bilirubin, Total: 0.7

## 2023-08-24 LAB — CBC: RBC: 4.5 (ref 3.87–5.11)

## 2023-08-24 LAB — BASIC METABOLIC PANEL WITH GFR
BUN: 24 — AB (ref 4–21)
CO2: 28 — AB (ref 13–22)
Chloride: 101 (ref 99–108)
Creatinine: 0.7 (ref 0.5–1.1)
Glucose: 83
Potassium: 4.6 meq/L (ref 3.5–5.1)
Sodium: 136 — AB (ref 137–147)

## 2023-09-09 ENCOUNTER — Ambulatory Visit: Payer: PPO | Attending: Internal Medicine | Admitting: Internal Medicine

## 2023-09-09 VITALS — BP 135/74 | HR 67 | Ht 69.0 in | Wt 197.2 lb

## 2023-09-09 DIAGNOSIS — I480 Paroxysmal atrial fibrillation: Secondary | ICD-10-CM

## 2023-09-09 DIAGNOSIS — F4323 Adjustment disorder with mixed anxiety and depressed mood: Secondary | ICD-10-CM | POA: Diagnosis not present

## 2023-09-09 NOTE — Progress Notes (Signed)
 Patient Care Team: Abdul Fine, MD as PCP - General (Family Medicine) Fernande Elspeth BROCKS, MD as PCP - Electrophysiology (Cardiology) Dow Arland BROCKS, NP (Inactive) as Nurse Practitioner (Nurse Practitioner) Kelsie Agent, MD (Inactive) as Consulting Physician (Cardiology) Camella Fallow, MD as Consulting Physician (Orthopedic Surgery) Jeri Donnice Burkes, MD as Referring Physician (Gastroenterology) Lenn Elspeth, MD as Consulting Physician (Ophthalmology) Caro Harlene POUR, NP as Nurse Practitioner (Geriatric Medicine)   HPI  Monica Whitaker is a 78 y.o. female Seen in followup-- longstanding pt of JA, PAF with hx of ablation X 2 2014/2020 managed with flecainide , diltiazem  and anticoagulation with Apixoban.  B/c of desire to come off anticoagulation ILR implanted to monitor AF burden  ILR reports reviewed  4/22 >> afib NO--- sinus with PACs  interogated >>   Progressive dementia >>> memory care but now has been moved back to assisted living.  She has recurrent falls.  She is sent today with paperwork from [facility] documenting systolic blood pressure going from 130s--70s without appreciable change in heart rate.  She has no recollection of these falls.  Has been on flecainide  since 2012  Note reviewed from psychiatry 10/22 noting depression doing pretty well.  Also hospice counselor  but she denies that she is on hospice.  The patient denies chest pain, shortness of breath, nocturnal dyspnea, orthopnea or peripheral edema.  There have been no palpitations, lightheadedness or syncope.   Walks wi walker  some balance issues Memory ok but struggles   Date Cr K Hgb  5/22 0.56 3.7 11.1   11/23 0.6 4.4  12.8  8/24 0.8 4.8 12.3 (6/24)   DATE TEST EF   5/22 Echo   55-60 %                       Thromboembolic risk factors ( age -65, TIA/CVA-2, Gender-1) for a CHADSVASc Score of >=4   Records and Results Reviewed  Past Medical History:  Diagnosis  Date   Actinic keratosis 12/20/2012   left nasal tip   Anxiety    controlled with meds   Anxiety    Arrhythmia    Atrial fibrillation ([name])    paroxysmal, failed medical therapy with flecainide ,  NSVT with tikosyn   Basal cell carcinoma 12/20/2012   left proximal mandible   Bruises easily    CVA (cerebral vascular accident) ([name]) 12/2007   Depression    medically controlled    Glaucoma    also with macular holes AE Dr. Dingledein   History of loop recorder    Hypothyroidism    Low blood pressure    no falls, but if gets up to fast   RLS (restless legs syndrome)    Stroke ([name]) 7 years ago   Lasting effects on balance, different personality.   Thyroid  disease    Tremor    Ventricular tachycardia ([name])    pt told at [facility] that she had RVOT VT    Past Surgical History:  Procedure Laterality Date   ATRIAL FIBRILLATION ABLATION  10/18/12   PVI by Dr Kelsie   ATRIAL FIBRILLATION ABLATION N/A 10/18/2012   Procedure: ATRIAL FIBRILLATION ABLATION;  Surgeon: Agent Kelsie, MD;  Location: [facility] CATH LAB;  Service: Cardiovascular;  Laterality: N/A;   ATRIAL FIBRILLATION ABLATION N/A 04/18/2019   Procedure: ATRIAL FIBRILLATION ABLATION;  Surgeon: Kelsie Agent, MD;  Location: [facility] INVASIVE CV LAB;  Service: Cardiovascular;  Laterality: N/A;   EUS N/A 10/19/2019  Procedure: FULL UPPER ENDOSCOPIC ULTRASOUND (EUS) RADIAL;  Surgeon: Queenie Asberry LABOR, MD;  Location: [facility] ENDOSCOPY;  Service: Gastroenterology;  Laterality: N/A;   EYE SURGERY     on L/R eye, macular hole    FRACTURE SURGERY     implantable loop recorder placement  07/13/2019   MDT Reveal [employer] [facility] [id number]) implanted in office by Dr Kelsie for afib management post ablation   OPEN REDUCTION INTERNAL FIXATION (ORIF) DISTAL RADIAL FRACTURE Right 11/15/2014   Procedure: OPEN REDUCTION INTERNAL FIXATION (ORIF) RIGHT DISTAL RADIAL FRACTURE WITH ALLOGRAFT BONE GRAFT;  Surgeon: Elsie Mussel, MD;  Location:  SURGERY CENTER;   Service: Orthopedics;  Laterality: Right;   PANCREATECTOMY N/A 02/13/2020   Procedure: LAPAROSCOPIC DISTAL PANCREATECTOMY;  Surgeon: Aron Shoulders, MD;  Location: [facility] OR;  Service: General;  Laterality: N/A;   TEE WITHOUT CARDIOVERSION N/A 10/18/2012   Procedure: TRANSESOPHAGEAL ECHOCARDIOGRAM (TEE);  Surgeon: Redell GORMAN Shallow, MD;  Location: [facility] ENDOSCOPY;  Service: Cardiovascular;  Laterality: N/A;  Pre-Ablation at 12pm   TONSILLECTOMY     VAGINAL HYSTERECTOMY      Current Meds  Medication Sig   acetaminophen  (TYLENOL ) 325 MG tablet Take 650 mg by mouth every 4 (four) hours as needed.   acetaminophen  (TYLENOL ) 500 MG tablet Take 1,000 mg by mouth 2 (two) times daily.   apixaban  (ELIQUIS ) 5 MG TABS tablet Take 1 tablet (5 mg total) by mouth in the morning and at bedtime.   ARIPiprazole (ABILIFY) 2 MG tablet Take 2 mg by mouth daily.   bismuth subsalicylate (PEPTO BISMOL) 262 MG/15ML suspension Take 30 mLs by mouth as needed.   cetirizine (ZYRTEC) 10 MG tablet Take 10 mg by mouth daily as needed for allergies.   Cranberry 425 MG CAPS Take 2 capsules by mouth at bedtime.   escitalopram  (LEXAPRO ) 10 MG tablet Take 1 tablet (10 mg total) by mouth daily.   flecainide  (TAMBOCOR ) 100 MG tablet Take 100 mg by mouth 2 (two) times daily.   gabapentin  (NEURONTIN ) 100 MG capsule Take 200 mg by mouth 2 (two) times daily.   Glucose 15 GM/32ML GEL Give one packet by mouth as needed.   Levothyroxine  Sodium 25 MCG CAPS Take 1 capsule by mouth at bedtime.   LORazepam  (ATIVAN ) 0.5 MG tablet TAKE 1 TABLET  BY MOUTH THREE TIMES A DAY   MAGNESIUM GLYCINATE PO Give 600mg  by mouth once daily for headaches.   magnesium hydroxide (MILK OF MAGNESIA) 400 MG/5ML suspension Take by mouth as needed for mild constipation. 2 tablespoons   melatonin 3 MG TABS tablet Take 3 mg by mouth at bedtime.   midodrine  (PROAMATINE ) 5 MG tablet Take 1 tablet (5 mg total) by mouth 3 (three) times daily with meals. Take it at 7am, 11am & 3  pm   multivitamin-iron-minerals-folic acid (THERAPEUTIC-M) TABS tablet Take 1 tablet by mouth daily.   nystatin (MYCOSTATIN/NYSTOP) powder Apply 1 Application topically 2 (two) times daily.   olopatadine (PATADAY) 0.1 % ophthalmic solution Place 1 drop into both eyes daily.   ondansetron  (ZOFRAN ) 4 MG tablet Take 4 mg by mouth 3 (three) times daily as needed for nausea or vomiting.   polyethylene glycol powder (GLYCOLAX /MIRALAX ) 17 GM/SCOOP powder Take 1 Container by mouth every other day. Give one scoop by mouth as needed.   pravastatin  (PRAVACHOL ) 10 MG tablet Take 1 tablet (10 mg total) by mouth at bedtime. Take a whole pill for pill pack and delivery   risedronate (ACTONEL) 150 MG tablet Take 150 mg  by mouth every 30 (thirty) days. with water  on empty stomach, nothing by mouth or lie down for next 30 minutes.   Simethicone  (GAS-X PO) Give one capsule by mouth as needed after meals and at bedtime.   sodium chloride  1 g tablet Take 4 g by mouth daily at 12 noon.   timolol  (TIMOPTIC ) 0.5 % ophthalmic solution Place 1 drop into the left eye at bedtime.   triamcinolone  cream (KENALOG ) 0.1 % Apply 1 Application topically daily as needed.   vitamin B-12 (CYANOCOBALAMIN ) 100 MCG tablet Take 400 mcg by mouth daily.    Allergies  Allergen Reactions   Darvon Other (See Comments)    Severe panic attacks   Propoxyphene Other (See Comments)    GI Upset Severe panic attacks GI Upset   Propoxyphene N-Acetaminophen  Other (See Comments)    Severe panic attacks   Levofloxacin Anxiety and Other (See Comments)    Causes panic attacks.      Review of Systems negative except from HPI and PMH  Physical Exam BP 135/74 (BP Location: Left Arm, Patient Position: Sitting, Cuff Size: Normal)   Pulse 67   Ht 5' 9 (1.753 m)   Wt 197 lb 3.2 oz (89.4 kg)   SpO2 94%   BMI 29.12 kg/m  Well developed and nourished in no acute distress HENT normal Neck supple with JVP-  6 Clear Regular rate and rhythm,  no murmurs or gallops Abd-soft with active BS No Clubbing cyanosis large calfs with only trace edema edema Skin-warm and dry A & Oriented  Grossly normal sensory and motor function  ECG sinus 24/11/39   CrCl cannot be calculated (Patient's most recent lab result is older than the maximum 21 days allowed.).   Assessment and  Plan  Atrial fibrillation s/p ablation x 2  Dementia progressive  Flecainide   Orthostatic hypotension with falls   No interval falls per report  She remains on mitodrine, continue.  No interval atrial fib of which she is aware, continue flec and apixaban ,  Edema is trace, but with orthostatic hypotension, will not address at this point     The patient has had recurrent falls with orthostatic hypotension vital signs sent from [facility] blood pressure falls notably associated with essentially no change in heart rate suggesting underlying autonomic failure. We discussed the physiology of orthostatic intolerance including gravitational fluid shifts and the impact of hypertensive vascular disease on orthostasis and treatment options.  We discussed pharmacological options including midodrine .  We discussed nonpharmacological options including raising the HOB, isometric contraction upon standing, abdominal binders  thigh sleeves. We emphasized the importance of recognizing the prodrome and sitting prior to falling, safety in the shower and in the bathroom and the avoidance of dehydration   We will begin with a nonpharmacological approach with an abdominal binder as well as uptitrating her ProAmatine  from 2.5 twice daily to 5 mg +0 700 11 00 and 1500 hours.  She will be a candidate for fludrocortisone given her relatively low supine blood pressures.  I would add this as her next medication if needed at 0.1 mg twice daily.  Note interval atrial fibrillation was detected on interrogation of her monitor of any note.  Continue the flecainide  for now continue 100 mg bid,   no data that I can find about flecainide  aggravating orthostasis   She has peripheral edema; however, I am loath to treat it given her orthostasis and the effect of intravascular volume on repeat risk of recurrent falls.  Current medicines are reviewed at length with the patient today .  The patient does not have concerns regarding medicines.

## 2023-09-09 NOTE — Patient Instructions (Signed)
Medication Instructions:  The current medical regimen is effective;  continue present plan and medications.  *If you need a refill on your cardiac medications before your next appointment, please call your pharmacy*   Follow-Up: At [facility], you and your health needs are our priority.  As part of our continuing mission to provide you with exceptional heart care, we have created designated Provider Care Teams.  These Care Teams include your primary Cardiologist (physician) and Advanced Practice Providers (APPs -  Physician Assistants and Nurse Practitioners) who all work together to provide you with the care you need, when you need it.  We recommend signing up for the patient portal called "MyChart".  Sign up information is provided on this After Visit Summary.  MyChart is used to connect with patients for Virtual Visits (Telemedicine).  Patients are able to view lab/test results, encounter notes, upcoming appointments, etc.  Non-urgent messages can be sent to your provider as well.   To learn more about what you can do with MyChart, go to [email or web address].    Your next appointment:   12 month(s)  Provider:   Sherryl Manges, MD

## 2023-09-28 DIAGNOSIS — Z961 Presence of intraocular lens: Secondary | ICD-10-CM | POA: Diagnosis not present

## 2023-10-08 DIAGNOSIS — F330 Major depressive disorder, recurrent, mild: Secondary | ICD-10-CM | POA: Diagnosis not present

## 2023-10-08 DIAGNOSIS — F4323 Adjustment disorder with mixed anxiety and depressed mood: Secondary | ICD-10-CM | POA: Diagnosis not present

## 2023-10-13 ENCOUNTER — Other Ambulatory Visit: Payer: Self-pay | Admitting: Student

## 2023-10-13 DIAGNOSIS — Z1231 Encounter for screening mammogram for malignant neoplasm of breast: Secondary | ICD-10-CM

## 2023-10-21 DIAGNOSIS — F4323 Adjustment disorder with mixed anxiety and depressed mood: Secondary | ICD-10-CM | POA: Diagnosis not present

## 2023-10-21 DIAGNOSIS — F330 Major depressive disorder, recurrent, mild: Secondary | ICD-10-CM | POA: Diagnosis not present

## 2023-10-25 DIAGNOSIS — I7091 Generalized atherosclerosis: Secondary | ICD-10-CM | POA: Diagnosis not present

## 2023-10-25 DIAGNOSIS — B351 Tinea unguium: Secondary | ICD-10-CM | POA: Diagnosis not present

## 2023-11-03 ENCOUNTER — Other Ambulatory Visit: Payer: Self-pay | Admitting: Student

## 2023-11-03 DIAGNOSIS — R413 Other amnesia: Secondary | ICD-10-CM

## 2023-11-03 NOTE — Telephone Encounter (Signed)
 Pharmacy requested refill.  [facility] Patient.  Forwarded to Dr. Sydnee Cabal for approval.

## 2023-11-04 DIAGNOSIS — F4323 Adjustment disorder with mixed anxiety and depressed mood: Secondary | ICD-10-CM | POA: Diagnosis not present

## 2023-11-04 DIAGNOSIS — F330 Major depressive disorder, recurrent, mild: Secondary | ICD-10-CM | POA: Diagnosis not present

## 2023-11-09 ENCOUNTER — Ambulatory Visit
Admission: RE | Admit: 2023-11-09 | Discharge: 2023-11-09 | Disposition: A | Discharge status: Home / Self Care | Source: Ambulatory Visit | Attending: Student | Admitting: Student

## 2023-11-09 DIAGNOSIS — Z1231 Encounter for screening mammogram for malignant neoplasm of breast: Secondary | ICD-10-CM | POA: Diagnosis not present

## 2023-11-11 ENCOUNTER — Encounter: Payer: Self-pay | Admitting: Student

## 2023-11-18 DIAGNOSIS — L570 Actinic keratosis: Secondary | ICD-10-CM | POA: Diagnosis not present

## 2023-11-18 DIAGNOSIS — L814 Other melanin hyperpigmentation: Secondary | ICD-10-CM | POA: Diagnosis not present

## 2023-11-18 DIAGNOSIS — L821 Other seborrheic keratosis: Secondary | ICD-10-CM | POA: Diagnosis not present

## 2023-11-19 DIAGNOSIS — F4323 Adjustment disorder with mixed anxiety and depressed mood: Secondary | ICD-10-CM | POA: Diagnosis not present

## 2023-11-19 DIAGNOSIS — F330 Major depressive disorder, recurrent, mild: Secondary | ICD-10-CM | POA: Diagnosis not present

## 2023-12-06 ENCOUNTER — Encounter: Payer: Self-pay | Admitting: Student

## 2023-12-06 NOTE — Progress Notes (Unsigned)
 A user error has taken place: encounter opened in error, closed for administrative reasons.

## 2023-12-07 NOTE — Progress Notes (Signed)
 This encounter was created in error - please disregard.

## 2023-12-13 ENCOUNTER — Non-Acute Institutional Stay: Payer: Self-pay | Admitting: Student

## 2023-12-13 ENCOUNTER — Encounter: Payer: Self-pay | Admitting: Student

## 2023-12-13 DIAGNOSIS — I4819 Other persistent atrial fibrillation: Secondary | ICD-10-CM

## 2023-12-13 DIAGNOSIS — F1320 Sedative, hypnotic or anxiolytic dependence, uncomplicated: Secondary | ICD-10-CM | POA: Diagnosis not present

## 2023-12-13 DIAGNOSIS — Z8673 Personal history of transient ischemic attack (TIA), and cerebral infarction without residual deficits: Secondary | ICD-10-CM | POA: Diagnosis not present

## 2023-12-13 DIAGNOSIS — F03B4 Unspecified dementia, moderate, with anxiety: Secondary | ICD-10-CM | POA: Diagnosis not present

## 2023-12-13 DIAGNOSIS — F331 Major depressive disorder, recurrent, moderate: Secondary | ICD-10-CM

## 2023-12-13 DIAGNOSIS — E0849 Diabetes mellitus due to underlying condition with other diabetic neurological complication: Secondary | ICD-10-CM | POA: Insufficient documentation

## 2023-12-13 DIAGNOSIS — Z9889 Other specified postprocedural states: Secondary | ICD-10-CM

## 2023-12-13 NOTE — Progress Notes (Signed)
 Location:  Other Nursing Home Room Number: [facility] - 403 Place of Service:  Home ([id number]) Provider:  Alver Jobs, Lisa Rideau, MD  Patient Care Team: Valrie Gehrig, MD as PCP - General (Family Medicine) Verona Goodwill, MD as PCP - Electrophysiology (Cardiology) Asa Lauth, NP (Inactive) as Nurse Practitioner (Nurse Practitioner) Jolly Needle, MD (Inactive) as Consulting Physician (Cardiology) Ronn Cohn, MD as Consulting Physician (Orthopedic Surgery) Luella Sager, MD as Referring Physician (Gastroenterology) Bertis Brochure, MD as Consulting Physician (Ophthalmology) Verma Gobble, NP as Nurse Practitioner (Geriatric Medicine)  Extended Emergency Contact Information Primary Emergency Contact: [name],[name] Address: [address]          [place], [place] [address] [place] Mobile Phone: [phone] Relation: Relative Secondary Emergency Contact: Willian Harrow Address: [address] [place], [place] [address] [place] Work Phone: [phone] Relation: Other  Code Status:  DNR Goals of care: Advanced Directive information    12/06/2023    9:36 AM  Advanced Directives  Does Patient Have a Medical Advance Directive? Yes  Type of Estate agent of [place];Living will  Does patient want to make changes to medical advance directive? No - Patient declined  Copy of Healthcare Power of Attorney in Chart? Yes - validated most recent copy scanned in chart (See row information)     Chief Complaint  Patient presents with   Medical Management of Chronic Issues    HPI:  Pt is a 78 y.o. female seen today for an acute visit for  Discussed the use of AI scribe software for clinical note transcription with the patient, who gave verbal consent to proceed.  History of Present Illness   History of Present Illness The patient presents with a tender rash on her face.  She has a tender  rash on her face that has been present for the last week or two. The rash is very sore and tender to the touch, located in one specific area. She is unsure if the rash has gotten bigger or spread, but note that it is faintly red and barely raised. No similar symptoms have been experienced in the past, and there are no other areas of pain on her face.  She has a history of depression and anxiety, managed with antidepressants and anxiety medication. She feels emotionally down and has been dealing with depression for about twelve years. She does not participate in many activities and prefers solitary activities. She was previously seeing a therapist but stopped due to increased rates not covered by insurance.  As a stroke survivor, she reports memory deficits, including difficulty recalling certain details such as the current month and year, which she attributes to her stroke history.  In terms of daily routine, she wears compression garments most of the time and engages in coloring activities occasionally, which she enjoys and was encouraged by a previous therapist.   Past Medical History:  Diagnosis Date   Actinic keratosis 12/20/2012   left nasal tip   Anxiety    controlled with meds   Anxiety    Arrhythmia    Atrial fibrillation ([name])    paroxysmal, failed medical therapy with flecainide ,  NSVT with tikosyn   Basal cell carcinoma 12/20/2012   left proximal mandible   Bruises easily    CVA (cerebral vascular accident) ([name]) 12/2007   Depression    medically controlled    Glaucoma    also  with macular holes AE Dr. Dingledein   History of loop recorder    Hypothyroidism    Low blood pressure    no falls, but if gets up to fast   RLS (restless legs syndrome)    Stroke ([name]) 7 years ago   Lasting effects on balance, different personality.   Thyroid  disease    Tremor    Ventricular tachycardia ([name])    pt told at [facility] that she had RVOT VT   Past Surgical History:  Procedure  Laterality Date   ATRIAL FIBRILLATION ABLATION  10/18/12   PVI by Dr Nunzio Belch   ATRIAL FIBRILLATION ABLATION N/A 10/18/2012   Procedure: ATRIAL FIBRILLATION ABLATION;  Surgeon: Jolly Needle, MD;  Location: [facility] CATH LAB;  Service: Cardiovascular;  Laterality: N/A;   ATRIAL FIBRILLATION ABLATION N/A 04/18/2019   Procedure: ATRIAL FIBRILLATION ABLATION;  Surgeon: Jolly Needle, MD;  Location: [facility] INVASIVE CV LAB;  Service: Cardiovascular;  Laterality: N/A;   EUS N/A 10/19/2019   Procedure: FULL UPPER ENDOSCOPIC ULTRASOUND (EUS) RADIAL;  Surgeon: Eloisa Hait, MD;  Location: [facility] ENDOSCOPY;  Service: Gastroenterology;  Laterality: N/A;   EYE SURGERY     on L/R eye, macular hole    FRACTURE SURGERY     implantable loop recorder placement  07/13/2019   MDT Reveal [id number] [facility] [id number]) implanted in office by Dr Nunzio Belch for afib management post ablation   OPEN REDUCTION INTERNAL FIXATION (ORIF) DISTAL RADIAL FRACTURE Right 11/15/2014   Procedure: OPEN REDUCTION INTERNAL FIXATION (ORIF) RIGHT DISTAL RADIAL FRACTURE WITH ALLOGRAFT BONE GRAFT;  Surgeon: Ronn Cohn, MD;  Location: [facility];  Service: Orthopedics;  Laterality: Right;   PANCREATECTOMY N/A 02/13/2020   Procedure: LAPAROSCOPIC DISTAL PANCREATECTOMY;  Surgeon: Lockie Rima, MD;  Location: [facility] OR;  Service: General;  Laterality: N/A;   TEE WITHOUT CARDIOVERSION N/A 10/18/2012   Procedure: TRANSESOPHAGEAL ECHOCARDIOGRAM (TEE);  Surgeon: Lenise Quince, MD;  Location: [facility] ENDOSCOPY;  Service: Cardiovascular;  Laterality: N/A;  Pre-Ablation at 12pm   TONSILLECTOMY     VAGINAL HYSTERECTOMY      Allergies  Allergen Reactions   Darvon Other (See Comments)    Severe panic attacks   Propoxyphene Other (See Comments)    GI Upset Severe panic attacks GI Upset   Propoxyphene N-Acetaminophen  Other (See Comments)    Severe panic attacks   Levofloxacin Anxiety and Other (See Comments)    Causes panic attacks.    Outpatient  Encounter Medications as of 12/13/2023  Medication Sig   acetaminophen  (TYLENOL ) 325 MG tablet Take 650 mg by mouth every 4 (four) hours as needed.   acetaminophen  (TYLENOL ) 500 MG tablet Take 1,000 mg by mouth 2 (two) times daily.   alendronate (FOSAMAX) 70 MG tablet Take 70 mg by mouth once a week. Take with a full glass of water  on an empty stomach.   aluminum-magnesium hydroxide 200-200 MG/5ML suspension Take 10 mLs by mouth every 4 (four) hours as needed for indigestion.   apixaban  (ELIQUIS ) 5 MG TABS tablet Take 1 tablet (5 mg total) by mouth in the morning and at bedtime.   ARIPiprazole (ABILIFY) 2 MG tablet Take 2 mg by mouth at bedtime.   bismuth subsalicylate (PEPTO BISMOL) 262 MG/15ML suspension Take 10 mLs by mouth as needed.   carbamide peroxide (DEBROX) 6.5 % OTIC solution Place 5 drops into both ears as needed.   cetirizine (ZYRTEC) 10 MG tablet Take 10 mg by mouth daily as needed for allergies.   Cranberry 425 MG  CAPS Take 2 capsules by mouth at bedtime. (Patient not taking: Reported on 12/06/2023)   CRANBERRY EXTRACT PO Take 2 tablets by mouth at bedtime.   dextromethorphan-guaiFENesin (ROBITUSSIN-DM) 10-100 MG/5ML liquid Take 5 mLs by mouth every 4 (four) hours as needed for cough.   escitalopram  (LEXAPRO ) 10 MG tablet Take 1 tablet (10 mg total) by mouth daily.   flecainide  (TAMBOCOR ) 100 MG tablet Take 100 mg by mouth 2 (two) times daily. (Patient not taking: Reported on 12/06/2023)   gabapentin  (NEURONTIN ) 100 MG capsule Take 200 mg by mouth 2 (two) times daily.   Glucose 15 GM/32ML GEL Give one packet by mouth as needed.   Levothyroxine  Sodium 25 MCG CAPS Take 1 capsule by mouth at bedtime.   LORazepam  (ATIVAN ) 0.5 MG tablet TAKE 1 TABLET  BY MOUTH THREE TIMES A DAY   MAGNESIUM GLYCINATE PO Give 600mg  by mouth once daily for headaches.   magnesium hydroxide (MILK OF MAGNESIA) 400 MG/5ML suspension Take by mouth as needed for mild constipation. 2 tablespoons   melatonin 3 MG  TABS tablet Take 3 mg by mouth at bedtime.   midodrine  (PROAMATINE ) 5 MG tablet Take 1 tablet (5 mg total) by mouth 3 (three) times daily with meals. Take it at 7am, 11am & 3 pm   multivitamin-iron-minerals-folic acid (THERAPEUTIC-M) TABS tablet Take 1 tablet by mouth daily.   nystatin (MYCOSTATIN/NYSTOP) powder Apply 1 Application topically as needed.   olopatadine (PATADAY) 0.1 % ophthalmic solution Place 1 drop into both eyes as needed.   ondansetron  (ZOFRAN ) 4 MG tablet Take 4 mg by mouth 3 (three) times daily as needed for nausea or vomiting.   OXYGEN 2lmp for dyspnea or SOB   polyethylene glycol powder (GLYCOLAX /MIRALAX ) 17 GM/SCOOP powder Take 1 Container by mouth every other day. Give one scoop by mouth as needed. (Patient not taking: Reported on 12/06/2023)   pravastatin  (PRAVACHOL ) 10 MG tablet Take 1 tablet (10 mg total) by mouth at bedtime. Take a whole pill for pill pack and delivery   Psyllium (METAMUCIL) WAFR Take 1 Wafer by mouth as needed.   risedronate (ACTONEL) 150 MG tablet Take 150 mg by mouth every 30 (thirty) days. with water  on empty stomach, nothing by mouth or lie down for next 30 minutes. (Patient not taking: Reported on 12/06/2023)   Simethicone  (GAS-X PO) Give one capsule by mouth as needed after meals and at bedtime.   sodium chloride  1 g tablet Take 4 g by mouth in the morning and at bedtime.   timolol  (TIMOPTIC ) 0.5 % ophthalmic solution Place 1 drop into the left eye at bedtime.   triamcinolone  cream (KENALOG ) 0.1 % Apply 1 Application topically daily as needed.   vitamin B-12 (CYANOCOBALAMIN ) 100 MCG tablet Take 400 mcg by mouth daily.   Wheat Dextrin (BENEFIBER PO) Take 1 Scoop by mouth daily.   No facility-administered encounter medications on file as of 12/13/2023.    Review of Systems  Immunization History  Administered Date(s) Administered   Fluad Quad(high Dose 65+) 05/10/2019, 05/22/2020   Influenza Split 05/21/2011, 05/31/2012   Influenza Whole  06/02/2010   Influenza, High Dose Seasonal PF 05/24/2014   Influenza,inj,Quad PF,6+ Mos 04/04/2015, 05/06/2016, 05/13/2017, 05/10/2018   Influenza-Unspecified 05/20/2022, 05/21/2023   Moderna Covid-19 Fall Seasonal Vaccine 70yrs & older 11/10/2022   Moderna SARS-COV2 Booster Vaccination 12/09/2020   Moderna Sars-Covid-2 Vaccination 08/17/2019, 09/14/2019, 06/18/2020, 04/24/2021, 12/30/2021   Pneumococcal Conjugate-13 10/02/2013   Pneumococcal Polysaccharide-23 04/04/2015   Td 04/18/2008   Tdap 10/30/2015  Unspecified SARS-COV-2 Vaccination 06/12/2022, 04/30/2023   Zoster Recombinant(Shingrix) 01/27/2018, 03/13/2019   Zoster, Live 04/18/2008   Pertinent  Health Maintenance Due  Topic Date Due   INFLUENZA VACCINE  03/03/2024   DEXA SCAN  Completed   Colonoscopy  Discontinued      12/19/2020    7:12 AM 12/26/2020   11:44 AM 02/20/2021    1:17 PM 10/18/2021    4:29 PM 12/30/2022   10:40 AM  Fall Risk  Falls in the past year?  0 0  1  Was there an injury with Fall?  0 0  0  Fall Risk Category Calculator  0 0  2  Fall Risk Category (Retired)  Low Low    (RETIRED) Patient Fall Risk Level High fall risk Low fall risk Low fall risk Low fall risk   Patient at Risk for Falls Due to     History of fall(s);Impaired balance/gait  Fall risk Follow up  Falls evaluation completed Falls evaluation completed  Education provided   Functional Status Survey:    Vitals:   12/13/23 1954  BP: 116/65  Resp: 17  Temp: 97.7 F (36.5 C)  SpO2: 91%  Weight: 195 lb 8 oz (88.7 kg)   Body mass index is 28.87 kg/m. Physical Exam Constitutional:      Appearance: Normal appearance.  Cardiovascular:     Rate and Rhythm: Normal rate. Rhythm irregular.     Pulses: Normal pulses.  Pulmonary:     Effort: Pulmonary effort is normal.  Abdominal:     General: Abdomen is flat. Bowel sounds are normal.     Palpations: Abdomen is soft.  Musculoskeletal:        General: No swelling or tenderness.   Skin:    General: Skin is warm and dry.  Neurological:     Mental Status: She is alert. Mental status is at baseline. She is disoriented.     Gait: Gait normal.  Psychiatric:        Mood and Affect: Mood normal.     Labs reviewed: Recent Labs    01/04/23 0000 03/18/23 0000 08/24/23 0000  NA 132* 141 136*  K 4.0 4.8 4.6  CL 95* 106 101  CO2 29* 27* 28*  BUN 15 24* 24*  CREATININE 0.7 0.8 0.7  CALCIUM 8.7 8.8 9.2   Recent Labs    01/04/23 0000 08/24/23 0000  AST 15 17  ALT 13 13  ALKPHOS 97 64  ALBUMIN 4.2 4.4   Recent Labs    01/04/23 0000 08/24/23 0000  WBC 3.5 5.1  NEUTROABS 1,344.00 2,407.00  HGB 12.3 13.8  HCT 37 42  PLT 224 214   Lab Results  Component Value Date   TSH 2.98 01/04/2023   Lab Results  Component Value Date   HGBA1C 5.6 02/14/2020   Lab Results  Component Value Date   CHOL 202 (A) 08/24/2023   HDL 105 (A) 08/24/2023   LDLCALC 75 06/12/2022   LDLDIRECT 123.5 09/18/2013   TRIG 82 08/24/2023   CHOLHDL 2 07/02/2020    Significant Diagnostic Results in last 30 days:  No results found.  Assessment/Plan Memory deficits post-stroke left MCA Persistent memory deficits following a previous stroke, affecting recall of dates and events. Also has associated anxiety for which she previously received psychiatric support at this time, symptoms are well-controlled with abilify, lexapro , and ativan  for mood.  - continue statin therapy  Osteoporosis Continue fosamax 70 mg weekly.   Depression Chronic depression for twelve  years, with reduced activity participation and preference for solitude. Previously seen by psychiatry discontinued due to cost, with reported improvement with medication. Continue Abilify, lexapro , and ativan  for mood .  Skin rash Tender, faintly red, barely raised facial rash for one to two weeks. It is non-vesicular and localized. Tender to touch, not painful otherwise. - Monitor rash until Wednesday for resolution or  further development - Consider topical corticosteroid if needed  Orthostatic hypotension Adherent to use of compression stockings. Continues midodrine  TID. BP remain soft at this time. No additional BP medications at this time.   Atrial fibrillation Continue flecainide  and f/u with cardiology. Continue eliquis   Family/ staff Communication: nursing  Labs/tests ordered:  CBC, CMP, Vit B12, Vit D

## 2023-12-16 DIAGNOSIS — L570 Actinic keratosis: Secondary | ICD-10-CM | POA: Diagnosis not present

## 2023-12-16 DIAGNOSIS — L821 Other seborrheic keratosis: Secondary | ICD-10-CM | POA: Diagnosis not present

## 2023-12-16 DIAGNOSIS — I482 Chronic atrial fibrillation, unspecified: Secondary | ICD-10-CM | POA: Diagnosis not present

## 2023-12-16 DIAGNOSIS — I951 Orthostatic hypotension: Secondary | ICD-10-CM | POA: Diagnosis not present

## 2023-12-16 DIAGNOSIS — N189 Chronic kidney disease, unspecified: Secondary | ICD-10-CM | POA: Diagnosis not present

## 2023-12-16 DIAGNOSIS — L814 Other melanin hyperpigmentation: Secondary | ICD-10-CM | POA: Diagnosis not present

## 2023-12-16 LAB — BASIC METABOLIC PANEL WITH GFR
BUN: 18 (ref 4–21)
CO2: 26 — AB (ref 13–22)
Chloride: 97 — AB (ref 99–108)
Creatinine: 0.6 (ref 0.5–1.1)
Glucose: 93
Potassium: 4.4 meq/L (ref 3.5–5.1)
Sodium: 131 — AB (ref 137–147)

## 2023-12-16 LAB — CBC AND DIFFERENTIAL
HCT: 37 (ref 36–46)
Hemoglobin: 12.1 (ref 12.0–16.0)
Neutrophils Absolute: 1027
Platelets: 165 10*3/uL (ref 150–400)
WBC: 3.2

## 2023-12-16 LAB — COMPREHENSIVE METABOLIC PANEL WITH GFR: Calcium: 8.4 — AB (ref 8.7–10.7)

## 2023-12-16 LAB — CBC: RBC: 3.97 (ref 3.87–5.11)

## 2023-12-21 DIAGNOSIS — R059 Cough, unspecified: Secondary | ICD-10-CM | POA: Diagnosis not present

## 2023-12-23 DIAGNOSIS — I482 Chronic atrial fibrillation, unspecified: Secondary | ICD-10-CM | POA: Diagnosis not present

## 2023-12-23 DIAGNOSIS — I951 Orthostatic hypotension: Secondary | ICD-10-CM | POA: Diagnosis not present

## 2023-12-23 LAB — BASIC METABOLIC PANEL WITH GFR
BUN: 20 (ref 4–21)
CO2: 26 — AB (ref 13–22)
Chloride: 100 (ref 99–108)
Creatinine: 0.6 (ref 0.5–1.1)
Glucose: 88
Potassium: 4.7 meq/L (ref 3.5–5.1)
Sodium: 135 — AB (ref 137–147)

## 2023-12-23 LAB — COMPREHENSIVE METABOLIC PANEL WITH GFR
Calcium: 8.5 — AB (ref 8.7–10.7)
eGFR: 95

## 2024-01-05 ENCOUNTER — Telehealth: Payer: Self-pay | Admitting: Pharmacist

## 2024-01-05 DIAGNOSIS — E7800 Pure hypercholesterolemia, unspecified: Secondary | ICD-10-CM

## 2024-01-05 NOTE — Progress Notes (Signed)
 Pharmacy Quality Measure Review  This patient is appearing on a report for being at risk of failing the adherence measure for cholesterol (statin) medications this calendar year.   Medication: Pravastatin  10 mg  Last fill date: 11/26/23 for 30 day supply  Patient gets meds filled at [facility] via cycle packs and with composite monthly billing. PDC 0.87 will likely pass the measure.   Geronimo Krabbe, PharmD, BCACP Clinical Pharmacist [phone]

## 2024-01-17 DIAGNOSIS — Z961 Presence of intraocular lens: Secondary | ICD-10-CM | POA: Diagnosis not present

## 2024-01-17 DIAGNOSIS — H40052 Ocular hypertension, left eye: Secondary | ICD-10-CM | POA: Diagnosis not present

## 2024-01-27 ENCOUNTER — Encounter: Payer: Self-pay | Admitting: Nurse Practitioner

## 2024-01-27 ENCOUNTER — Non-Acute Institutional Stay: Payer: Self-pay | Admitting: Nurse Practitioner

## 2024-01-27 DIAGNOSIS — Z0000 Encounter for general adult medical examination without abnormal findings: Secondary | ICD-10-CM

## 2024-01-27 NOTE — Progress Notes (Signed)
 Subjective:   Monica Whitaker is a 78 y.o. female who presents for Medicare Annual (Subsequent) preventive examination.  Visit Complete: In person at [facility]    Cardiac Risk Factors include: advanced age (>89men, >68 women)     Objective:    Today's Vitals   01/27/24 0910  BP: 110/70  Pulse: (!) 54  Resp: 17  Temp: (!) 97.5 F (36.4 C)  SpO2: 96%  Weight: 197 lb 8 oz (89.6 kg)  Height: 5' 9 (1.753 m)   Body mass index is 29.17 kg/m.     12/06/2023    9:36 AM 08/19/2023   12:49 PM 02/19/2023   10:08 AM 01/22/2023    9:14 AM 01/21/2023    7:20 PM 12/30/2022   10:59 AM 12/18/2022    1:42 PM  Advanced Directives  Does Patient Have a Medical Advance Directive? Yes Yes Yes Yes Yes Yes Yes  Type of Estate agent of [place];Living will Healthcare Power of [place];Living will Healthcare Power of [place];Living will Healthcare Power of [place];Living will Healthcare Power of [place];Living will Healthcare Power of [place];Living will Healthcare Power of [place];Living will  Does patient want to make changes to medical advance directive? No - Patient declined No - Patient declined No - Patient declined No - Patient declined  No - Patient declined No - Patient declined  Copy of Healthcare Power of Attorney in Chart? Yes - validated most recent copy scanned in chart (See row information) Yes - validated most recent copy scanned in chart (See row information) Yes - validated most recent copy scanned in chart (See row information) Yes - validated most recent copy scanned in chart (See row information)  Yes - validated most recent copy scanned in chart (See row information) Yes - validated most recent copy scanned in chart (See row information)    Current Medications (verified) Outpatient Encounter Medications as of 01/27/2024  Medication Sig   acetaminophen  (TYLENOL ) 325 MG tablet Take 650 mg by mouth every 4 (four) hours as needed.   acetaminophen   (TYLENOL ) 500 MG tablet Take 1,000 mg by mouth 2 (two) times daily.   alendronate (FOSAMAX) 70 MG tablet Take 70 mg by mouth once a week. Take with a full glass of water  on an empty stomach.   aluminum-magnesium hydroxide 200-200 MG/5ML suspension Take 10 mLs by mouth every 4 (four) hours as needed for indigestion.   apixaban  (ELIQUIS ) 5 MG TABS tablet Take 1 tablet (5 mg total) by mouth in the morning and at bedtime.   ARIPiprazole (ABILIFY) 2 MG tablet Take 2 mg by mouth at bedtime.   bismuth subsalicylate (PEPTO BISMOL) 262 MG/15ML suspension Take 10 mLs by mouth as needed.   carbamide peroxide (DEBROX) 6.5 % OTIC solution Place 5 drops into both ears as needed.   cetirizine (ZYRTEC) 10 MG tablet Take 10 mg by mouth daily as needed for allergies.   Cranberry 425 MG CAPS Take 2 capsules by mouth at bedtime.   CRANBERRY EXTRACT PO Take 2 tablets by mouth at bedtime.   dextromethorphan-guaiFENesin (ROBITUSSIN-DM) 10-100 MG/5ML liquid Take 5 mLs by mouth every 4 (four) hours as needed for cough.   escitalopram  (LEXAPRO ) 10 MG tablet Take 1 tablet (10 mg total) by mouth daily.   flecainide  (TAMBOCOR ) 100 MG tablet Take 100 mg by mouth 2 (two) times daily.   gabapentin  (NEURONTIN ) 100 MG capsule Take 200 mg by mouth 2 (two) times daily.   Glucose 15 GM/32ML GEL Give one packet  by mouth as needed.   Levothyroxine  Sodium 25 MCG CAPS Take 1 capsule by mouth at bedtime.   LORazepam  (ATIVAN ) 0.5 MG tablet TAKE 1 TABLET  BY MOUTH THREE TIMES A DAY   MAGNESIUM GLYCINATE PO Give 600mg  by mouth once daily for headaches.   magnesium hydroxide (MILK OF MAGNESIA) 400 MG/5ML suspension Take by mouth as needed for mild constipation. 2 tablespoons   melatonin 3 MG TABS tablet Take 3 mg by mouth at bedtime.   midodrine  (PROAMATINE ) 5 MG tablet Take 1 tablet (5 mg total) by mouth 3 (three) times daily with meals. Take it at 7am, 11am & 3 pm   multivitamin-iron-minerals-folic acid (THERAPEUTIC-M) TABS tablet Take 1  tablet by mouth daily.   nystatin (MYCOSTATIN/NYSTOP) powder Apply 1 Application topically as needed.   olopatadine (PATADAY) 0.1 % ophthalmic solution Place 1 drop into both eyes as needed.   ondansetron  (ZOFRAN ) 4 MG tablet Take 4 mg by mouth 3 (three) times daily as needed for nausea or vomiting.   OXYGEN 2lmp for dyspnea or SOB   polyethylene glycol powder (GLYCOLAX /MIRALAX ) 17 GM/SCOOP powder Take 1 Container by mouth every other day. Give one scoop by mouth as needed.   pravastatin  (PRAVACHOL ) 10 MG tablet Take 1 tablet (10 mg total) by mouth at bedtime. Take a whole pill for pill pack and delivery   Psyllium (METAMUCIL) WAFR Take 1 Wafer by mouth as needed.   Simethicone  (GAS-X PO) Give one capsule by mouth as needed after meals and at bedtime.   sodium chloride  1 g tablet Take 4 g by mouth in the morning and at bedtime.   timolol  (TIMOPTIC ) 0.5 % ophthalmic solution Place 1 drop into the left eye at bedtime.   triamcinolone  cream (KENALOG ) 0.1 % Apply 1 Application topically daily as needed.   vitamin B-12 (CYANOCOBALAMIN ) 100 MCG tablet Take 400 mcg by mouth daily.   Wheat Dextrin (BENEFIBER PO) Take 1 Scoop by mouth daily.   risedronate (ACTONEL) 150 MG tablet Take 150 mg by mouth every 30 (thirty) days. with water  on empty stomach, nothing by mouth or lie down for next 30 minutes. (Patient not taking: Reported on 01/27/2024)   No facility-administered encounter medications on file as of 01/27/2024.    Allergies (verified) Darvon, Propoxyphene, Propoxyphene n-acetaminophen , and Levofloxacin   History: Past Medical History:  Diagnosis Date   Actinic keratosis 12/20/2012   left nasal tip   Anxiety    controlled with meds   Anxiety    Arrhythmia    Atrial fibrillation ([name])    paroxysmal, failed medical therapy with flecainide ,  NSVT with tikosyn   Basal cell carcinoma 12/20/2012   left proximal mandible   Bruises easily    CVA (cerebral vascular accident) ([name]) 12/2007    Depression    medically controlled    Glaucoma    also with macular holes AE Dr. Dingledein   History of loop recorder    Hypothyroidism    Low blood pressure    no falls, but if gets up to fast   RLS (restless legs syndrome)    Stroke ([name]) 7 years ago   Lasting effects on balance, different personality.   Thyroid  disease    Tremor    Ventricular tachycardia ([name])    pt told at [facility] that she had RVOT VT   Past Surgical History:  Procedure Laterality Date   ATRIAL FIBRILLATION ABLATION  10/18/12   PVI by Dr Kelsie   ATRIAL FIBRILLATION ABLATION N/A 10/18/2012  Procedure: ATRIAL FIBRILLATION ABLATION;  Surgeon: Lynwood Rakers, MD;  Location: [facility] CATH LAB;  Service: Cardiovascular;  Laterality: N/A;   ATRIAL FIBRILLATION ABLATION N/A 04/18/2019   Procedure: ATRIAL FIBRILLATION ABLATION;  Surgeon: Rakers Lynwood, MD;  Location: [facility] INVASIVE CV LAB;  Service: Cardiovascular;  Laterality: N/A;   EUS N/A 10/19/2019   Procedure: FULL UPPER ENDOSCOPIC ULTRASOUND (EUS) RADIAL;  Surgeon: Queenie Asberry LABOR, MD;  Location: [facility] ENDOSCOPY;  Service: Gastroenterology;  Laterality: N/A;   EYE SURGERY     on L/R eye, macular hole    FRACTURE SURGERY     implantable loop recorder placement  07/13/2019   MDT Reveal LINQ1 [facility] [id number]) implanted in office by Dr Rakers for afib management post ablation   OPEN REDUCTION INTERNAL FIXATION (ORIF) DISTAL RADIAL FRACTURE Right 11/15/2014   Procedure: OPEN REDUCTION INTERNAL FIXATION (ORIF) RIGHT DISTAL RADIAL FRACTURE WITH ALLOGRAFT BONE GRAFT;  Surgeon: Elsie Mussel, MD;  Location: [facility];  Service: Orthopedics;  Laterality: Right;   PANCREATECTOMY N/A 02/13/2020   Procedure: LAPAROSCOPIC DISTAL PANCREATECTOMY;  Surgeon: Aron Shoulders, MD;  Location: [facility] OR;  Service: General;  Laterality: N/A;   TEE WITHOUT CARDIOVERSION N/A 10/18/2012   Procedure: TRANSESOPHAGEAL ECHOCARDIOGRAM (TEE);  Surgeon: Redell GORMAN Shallow, MD;  Location: [facility]  ENDOSCOPY;  Service: Cardiovascular;  Laterality: N/A;  Pre-Ablation at 12pm   TONSILLECTOMY     VAGINAL HYSTERECTOMY     Family History  Problem Relation Age of Onset   Multiple sclerosis Father    Breast cancer Neg Hx    Social History   Socioeconomic History   Marital status: Widowed    Spouse name: Not on file   Number of children: 0   Years of education: Not on file   Highest education level: Not on file  Occupational History   Occupation: Retired    Associate Professor: RETIRED  Tobacco Use   Smoking status: Never   Smokeless tobacco: Never  Vaping Use   Vaping status: Never Used  Substance and Sexual Activity   Alcohol use: Yes    Alcohol/week: 2.0 standard drinks of alcohol    Types: 2 Standard drinks or equivalent per week    Comment: regular   Drug use: No   Sexual activity: Yes  Other Topics Concern   Not on file  Social History Narrative   Lives in [place] with her husband.  No children 2 step kids.  Former Retail buyer   Enjoys exercise- water  classes, yoga strength training.    Widowed 2019    Only child and mother was only child   Used to sail with husband      Has a living will-    Would desire CPR   Would not want prolonged life support if futile.      Has 1 cat lives alone no kids but see above    Social Drivers of Corporate investment banker Strain: Low Risk  (08/09/2019)   Overall Financial Resource Strain (CARDIA)    Difficulty of Paying Living Expenses: Not hard at all  Food Insecurity: No Food Insecurity (08/09/2019)   Hunger Vital Sign    Worried About Running Out of Food in the Last Year: Never true    Ran Out of Food in the Last Year: Never true  Transportation Needs: No Transportation Needs (08/09/2019)   PRAPARE - Administrator, Civil Service (Medical): No    Lack of Transportation (Non-Medical): No  Physical Activity: Sufficiently Active (08/09/2019)  Exercise Vital Sign    Days of Exercise per Week: 7 days    Minutes of  Exercise per Session: 60 min  Stress: No Stress Concern Present (08/09/2019)   [employer] of Occupational Health - Occupational Stress Questionnaire    Feeling of Stress : Only a little  Social Connections: Unknown (08/09/2019)   Social Connection and Isolation Panel    Frequency of Communication with Friends and Family: Three times a week    Frequency of Social Gatherings with Friends and Family: Once a week    Attends Religious Services: Not on file    Active Member of Clubs or Organizations: Not on file    Attends Banker Meetings: Not on file    Marital Status: Widowed    Tobacco Counseling Counseling given: Not Answered   Clinical Intake:  Pre-visit preparation completed: Yes  Pain : No/denies pain     BMI - recorded: 29 Nutritional Status: BMI 25 -29 Overweight Nutritional Risks: Other (Comment) (memory loss)  How often do you need to have someone help you when you read instructions, pamphlets, or other written materials from your doctor or pharmacy?: 3 - Sometimes         Activities of Daily Living    01/27/2024    2:07 PM  In your present state of health, do you have any difficulty performing the following activities:  Hearing? 0  Vision? 1  Difficulty concentrating or making decisions? 1  Walking or climbing stairs? 1  Dressing or bathing? 0  Doing errands, shopping? 1  Preparing Food and eating ? N  Using the Toilet? N  In the past six months, have you accidently leaked urine? Y  Do you have problems with loss of bowel control? N  Managing your Medications? Y  Managing your Finances? Y  Housekeeping or managing your Housekeeping? Y    Patient Care Team: Abdul Fine, MD as PCP - General (Family Medicine) Fernande Elspeth BROCKS, MD as PCP - Electrophysiology (Cardiology) Dow Arland BROCKS, NP (Inactive) as Nurse Practitioner (Nurse Practitioner) Kelsie Agent, MD (Inactive) as Consulting Physician (Cardiology) Camella Fallow, MD as  Consulting Physician (Orthopedic Surgery) Jeri Donnice Burkes, MD as Referring Physician (Gastroenterology) Lenn Elspeth, MD as Consulting Physician (Ophthalmology) Caro Harlene POUR, NP as Nurse Practitioner (Geriatric Medicine)  Indicate any recent Medical Services you may have received from other than [facility] providers in the past year (date may be approximate).     Assessment:   This is a routine wellness examination for Heavenly.  Hearing/Vision screen No results found.   Goals Addressed   None    Depression Screen    01/27/2024    2:11 PM 02/20/2021    1:18 PM 12/26/2020   11:44 AM 08/14/2020    4:05 PM 06/06/2020   11:09 AM 02/02/2020   11:18 AM 12/15/2019    1:10 PM  PHQ 2/9 Scores  PHQ - 2 Score 2 3  1  0 0 0  PHQ- 9 Score 5 7   0 0   Exception Documentation   Other- indicate reason in comment box --       Fall Risk    01/27/2024    2:10 PM 12/30/2022   10:40 AM 02/20/2021    1:17 PM 12/26/2020   11:44 AM 10/09/2020   11:48 AM  Fall Risk   Falls in the past year? 0 1 0 0 0  Number falls in past yr: 0 1 0 0 0  Injury with Fall?  0 0 0 0  Risk for fall due to :  History of fall(s);Impaired balance/gait     Follow up  Education provided Falls evaluation completed  Falls evaluation completed  Falls evaluation completed      Data saved with a previous flowsheet row definition    MEDICARE RISK AT HOME:    TIMED UP AND GO:  Was the test performed?  No    Cognitive Function:    05/07/2017    4:00 PM 05/06/2016    2:17 PM  MMSE - Mini Mental State Exam  Orientation to time 5  5   Orientation to Place 5  5   Registration 3  3   Attention/ Calculation 0  0   Recall 3  2   Recall-comments  pt was unable to recall 1 of 3 words   Language- name 2 objects 0  0   Language- repeat 1 1  Language- follow 3 step command 3  3   Language- read & follow direction 0  0   Write a sentence 0  0   Copy design 0  0   Total score 20  19      Data saved with a previous  flowsheet row definition        08/09/2019   11:37 AM  6CIT Screen  What Year? 0 points  What month? 0 points  What time? 0 points  Count back from 20 0 points  Months in reverse 0 points  Repeat phrase 0 points  Total Score 0 points    Immunizations Immunization History  Administered Date(s) Administered   Fluad Quad(high Dose 65+) 05/10/2019, 05/22/2020   Influenza Split 05/21/2011, 05/31/2012   Influenza Whole 06/02/2010   Influenza, High Dose Seasonal PF 05/24/2014   Influenza,inj,Quad PF,6+ Mos 04/04/2015, 05/06/2016, 05/13/2017, 05/10/2018   Influenza-Unspecified 05/20/2022, 05/21/2023   Moderna Covid-19 Fall Seasonal Vaccine 73yrs & older 11/10/2022   Moderna SARS-COV2 Booster Vaccination 12/09/2020   Moderna Sars-Covid-2 Vaccination 08/17/2019, 09/14/2019, 06/18/2020, 04/24/2021, 12/30/2021   Pneumococcal Conjugate-13 10/02/2013   Pneumococcal Polysaccharide-23 04/04/2015   Td 04/18/2008   Tdap 10/30/2015   Unspecified SARS-COV-2 Vaccination 06/12/2022, 04/30/2023, 11/12/2023   Zoster Recombinant(Shingrix) 01/27/2018, 03/13/2019   Zoster, Live 04/18/2008    TDAP status: Up to date  Flu Vaccine status: Up to date  Pneumococcal vaccine status: Up to date  Covid-19 vaccine status: Information provided on how to obtain vaccines.   Qualifies for Shingles Vaccine? Yes   Zostavax completed No   Shingrix Completed?: Yes  Screening Tests Health Maintenance  Topic Date Due   Diabetic kidney evaluation - Urine ACR  Never done   COVID-19 Vaccine (10 - 2024-25 season) 01/07/2024   INFLUENZA VACCINE  03/03/2024   Diabetic kidney evaluation - eGFR measurement  12/22/2024   Medicare Annual Wellness (AWV)  01/26/2025   DTaP/Tdap/Td (3 - Td or Tdap) 10/29/2025   Pneumococcal Vaccine: 50+ Years  Completed   DEXA SCAN  Completed   Hepatitis C Screening  Completed   Zoster Vaccines- Shingrix  Completed   Hepatitis B Vaccines  Aged Out   HPV VACCINES  Aged Out    Meningococcal B Vaccine  Aged Out   Colonoscopy  Discontinued    Health Maintenance  Health Maintenance Due  Topic Date Due   Diabetic kidney evaluation - Urine ACR  Never done   COVID-19 Vaccine (10 - 2024-25 season) 01/07/2024    Colorectal cancer screening: No longer required.   Mammogram status: No longer  required due to age.  Bone Density status: Completed 08/2021. Results reflect: Bone density results: OSTEOPOROSIS. Repeat every 2 years.  Lung Cancer Screening: (Low Dose CT Chest recommended if Age 61-80 years, 20 pack-year currently smoking OR have quit w/in 15years.) does not qualify.   Lung Cancer Screening Referral: na  Additional Screening:  Hepatitis C Screening: does not qualify; Completed   Vision Screening: Recommended annual ophthalmology exams for early detection of glaucoma and other disorders of the eye. Is the patient up to date with their annual eye exam?  Yes  Who is the provider or what is the name of the office in which the patient attends annual eye exams? [email or web address] If pt is not established with a provider, would they like to be referred to a provider to establish care? No .   Dental Screening: Recommended annual dental exams for proper oral hygiene  Community Resource Referral / Chronic Care Management: CRR required this visit?  No   CCM required this visit?  No     Plan:     I have personally reviewed and noted the following in the patient's chart:   Medical and social history Use of alcohol, tobacco or illicit drugs  Current medications and supplements including opioid prescriptions. Patient is not currently taking opioid prescriptions. Functional ability and status Nutritional status Physical activity Advanced directives List of other physicians Hospitalizations, surgeries, and ER visits in previous 12 months Vitals Screenings to include cognitive, depression, and falls Referrals and appointments  In addition, I have reviewed and  discussed with patient certain preventive protocols, quality metrics, and best practice recommendations. A written personalized care plan for preventive services as well as general preventive health recommendations were provided to patient.     Harlene MARLA An, NP   01/27/2024

## 2024-02-07 DIAGNOSIS — L578 Other skin changes due to chronic exposure to nonionizing radiation: Secondary | ICD-10-CM | POA: Diagnosis not present

## 2024-02-07 DIAGNOSIS — L821 Other seborrheic keratosis: Secondary | ICD-10-CM | POA: Diagnosis not present

## 2024-02-07 DIAGNOSIS — L570 Actinic keratosis: Secondary | ICD-10-CM | POA: Diagnosis not present

## 2024-02-07 DIAGNOSIS — L814 Other melanin hyperpigmentation: Secondary | ICD-10-CM | POA: Diagnosis not present

## 2024-02-09 DIAGNOSIS — F419 Anxiety disorder, unspecified: Secondary | ICD-10-CM | POA: Diagnosis not present

## 2024-02-09 DIAGNOSIS — G3184 Mild cognitive impairment, so stated: Secondary | ICD-10-CM | POA: Diagnosis not present

## 2024-02-09 DIAGNOSIS — R4189 Other symptoms and signs involving cognitive functions and awareness: Secondary | ICD-10-CM | POA: Diagnosis not present

## 2024-02-09 DIAGNOSIS — R278 Other lack of coordination: Secondary | ICD-10-CM | POA: Diagnosis not present

## 2024-02-16 DIAGNOSIS — F331 Major depressive disorder, recurrent, moderate: Secondary | ICD-10-CM | POA: Diagnosis not present

## 2024-02-21 ENCOUNTER — Non-Acute Institutional Stay: Payer: Self-pay | Admitting: Student

## 2024-02-21 ENCOUNTER — Encounter: Payer: Self-pay | Admitting: Student

## 2024-02-21 DIAGNOSIS — F331 Major depressive disorder, recurrent, moderate: Secondary | ICD-10-CM

## 2024-02-21 NOTE — Progress Notes (Signed)
 Location:  Other [facility]) Nursing Home Room Number: AL 210 Place of Service:  ALF [phone]) [place] [facility]) Provider: Abdul Fine, MD  Patient Care Team: Abdul Fine, MD as PCP - General (Family Medicine) Fernande Elspeth BROCKS, MD as PCP - Electrophysiology (Cardiology) Dow Arland BROCKS, NP (Inactive) as Nurse Practitioner (Nurse Practitioner) Kelsie Agent, MD (Inactive) as Consulting Physician (Cardiology) Camella Fallow, MD as Consulting Physician (Orthopedic Surgery) Jeri Donnice Burkes, MD as Referring Physician (Gastroenterology) Lenn Elspeth, MD as Consulting Physician (Ophthalmology) Caro Harlene POUR, NP as Nurse Practitioner (Geriatric Medicine)  Extended Emergency Contact Information Primary Emergency Contact: [name],[name] Address: [address]          [place], [place] [address] [place] Mobile Phone: [phone] Relation: Relative Secondary Emergency Contact: [name] Address: [address], [place] [address] [name] Phone: [phone] Relation: Other  Code Status:  Full Code Goals of care: Advanced Directive information    02/21/2024    1:38 PM  Advanced Directives  Does Patient Have a Medical Advance Directive? Yes  Type of Advance Directive Living will;Healthcare Power of Attorney  Does patient want to make changes to medical advance directive? No - Patient declined  Copy of Healthcare Power of Attorney in Chart? Yes - validated most recent copy scanned in chart (See row information)     Chief Complaint  Patient presents with   Depression    depression    HPI:  Pt is a 78 y.o. female seen today for an acute visit for mood evaluation: History of Present Illness The patient, with a long history of depression, presents with worsening depression.  She has been experiencing increased feelings of depression, sadness, and a desire for isolation, with these symptoms becoming  more frequent. Her depression has been a long-standing issue, spanning over fifteen years, and she has been on various medications including Abilify, Lexapro , and lorazepam . Currently, she takes lorazepam  three times a day and has been on the same dose of her current medications for about a year and a half.  No recent changes in sleep patterns, but she has been napping or laying on the couch more than usual. Her appetite remains good, although she has gained five pounds in the last week, which she finds surprising. She enjoys ice cream and shakes, consuming them regularly. She is concerned about her weight, noting difficulty with her rings due to swelling.  She reminisces about her past, mentioning the loss of her husband a few years ago and her mother in a car accident when she was [age], which still affects her emotionally. [place] and has fond memories associated with it. She feels overwhelmed by day-to-day activities and appointments, especially with changes in her routine due to her occupational therapist being on maternity leave.  Despite her depression, she maintains a routine of daily walks and participates in exercise classes, which she enjoys.  Past Medical History - Depression    Medications - Abilify - Lexapro  - Lorazepam  (3 times a day)  Social History - Living Situation: Lives alone, [place] - Patient experiences depression, particularly after the loss of her husband and mother. She participates in exercise classes and walks daily. Anxiety is exacerbated by changes in routine and absence of regular caregiver. Has a history of seeing psychiatrists but has not in a long time due to logistical challenges.  Family History - Mother: died in a car wreck, deceased at age 31 due to car wreck   Assessment and Plan    Past Medical History:  Diagnosis Date   Actinic keratosis 12/20/2012   left nasal tip    Anxiety    controlled with meds   Anxiety    Arrhythmia    Atrial fibrillation ([name])    paroxysmal, failed medical therapy with flecainide ,  NSVT with tikosyn   Basal cell carcinoma 12/20/2012   left proximal mandible   Bruises easily    CVA (cerebral vascular accident) ([name]) 12/2007   Depression    medically controlled    Glaucoma    also with macular holes AE Dr. Dingledein   History of loop recorder    Hypothyroidism    Low blood pressure    no falls, but if gets up to fast   RLS (restless legs syndrome)    Stroke ([name]) 7 years ago   Lasting effects on balance, different personality.   Thyroid  disease    Tremor    Ventricular tachycardia ([name])    pt told at [facility] that she had RVOT VT   Past Surgical History:  Procedure Laterality Date   ATRIAL FIBRILLATION ABLATION  10/18/12   PVI by Dr Kelsie   ATRIAL FIBRILLATION ABLATION N/A 10/18/2012   Procedure: ATRIAL FIBRILLATION ABLATION;  Surgeon: Lynwood Kelsie, MD;  Location: [facility] CATH LAB;  Service: Cardiovascular;  Laterality: N/A;   ATRIAL FIBRILLATION ABLATION N/A 04/18/2019   Procedure: ATRIAL FIBRILLATION ABLATION;  Surgeon: Kelsie Lynwood, MD;  Location: [facility] INVASIVE CV LAB;  Service: Cardiovascular;  Laterality: N/A;   EUS N/A 10/19/2019   Procedure: FULL UPPER ENDOSCOPIC ULTRASOUND (EUS) RADIAL;  Surgeon: Queenie Asberry LABOR, MD;  Location: [facility] ENDOSCOPY;  Service: Gastroenterology;  Laterality: N/A;   EYE SURGERY     on L/R eye, macular hole    FRACTURE SURGERY     implantable loop recorder placement  07/13/2019   MDT Reveal LINQ1 [facility] [id number]) implanted in office by Dr Kelsie for afib management post ablation   OPEN REDUCTION INTERNAL FIXATION (ORIF) DISTAL RADIAL FRACTURE Right 11/15/2014   Procedure: OPEN REDUCTION INTERNAL FIXATION (ORIF) RIGHT DISTAL RADIAL FRACTURE WITH ALLOGRAFT BONE GRAFT;  Surgeon: Elsie Mussel, MD;  Location: [facility];  Service: Orthopedics;  Laterality: Right;    PANCREATECTOMY N/A 02/13/2020   Procedure: LAPAROSCOPIC DISTAL PANCREATECTOMY;  Surgeon: Aron Shoulders, MD;  Location: [facility] OR;  Service: General;  Laterality: N/A;   TEE WITHOUT CARDIOVERSION N/A 10/18/2012   Procedure: TRANSESOPHAGEAL ECHOCARDIOGRAM (TEE);  Surgeon: Redell GORMAN Shallow, MD;  Location: [facility] ENDOSCOPY;  Service: Cardiovascular;  Laterality: N/A;  Pre-Ablation at 12pm   TONSILLECTOMY     VAGINAL HYSTERECTOMY      Allergies  Allergen Reactions   Darvon Other (See Comments)    Severe panic attacks   Propoxyphene Other (See Comments)    GI Upset Severe panic attacks GI Upset   Propoxyphene N-Acetaminophen  Other (See Comments)    Severe panic attacks   Levofloxacin Anxiety and Other (See Comments)    Causes panic attacks.    Outpatient Encounter Medications as of 02/21/2024  Medication Sig   acetaminophen  (TYLENOL ) 325 MG tablet Take 650 mg by mouth every 4 (four) hours as needed.   acetaminophen  (TYLENOL ) 500 MG tablet Take 1,000 mg by mouth 2 (two) times daily.   alendronate (FOSAMAX) 70 MG tablet Take 70 mg by mouth once a week. Take with a full glass of  water  on an empty stomach.   aluminum-magnesium hydroxide 200-200 MG/5ML suspension Take 10 mLs by mouth every 4 (four) hours as needed for indigestion.   apixaban  (ELIQUIS ) 5 MG TABS tablet Take 1 tablet (5 mg total) by mouth in the morning and at bedtime.   ARIPiprazole (ABILIFY) 2 MG tablet Take 2 mg by mouth at bedtime.   bismuth subsalicylate (PEPTO BISMOL) 262 MG/15ML suspension Take 10 mLs by mouth as needed.   carbamide peroxide (DEBROX) 6.5 % OTIC solution Place 5 drops into both ears as needed.   cetirizine (ZYRTEC) 10 MG tablet Take 10 mg by mouth daily as needed for allergies.   Cranberry 425 MG CAPS Take 2 capsules by mouth at bedtime.   CRANBERRY EXTRACT PO Take 2 tablets by mouth at bedtime.   dextromethorphan-guaiFENesin (ROBITUSSIN-DM) 10-100 MG/5ML liquid Take 5 mLs by mouth every 4 (four) hours as needed for  cough.   escitalopram  (LEXAPRO ) 10 MG tablet Take 1 tablet (10 mg total) by mouth daily.   flecainide  (TAMBOCOR ) 100 MG tablet Take 100 mg by mouth 2 (two) times daily.   gabapentin  (NEURONTIN ) 100 MG capsule Take 200 mg by mouth 2 (two) times daily.   Glucose 15 GM/32ML GEL Give one packet by mouth as needed.   Levothyroxine  Sodium 25 MCG CAPS Take 1 capsule by mouth at bedtime.   LORazepam  (ATIVAN ) 0.5 MG tablet TAKE 1 TABLET  BY MOUTH THREE TIMES A DAY   MAGNESIUM GLYCINATE PO Give 600mg  by mouth once daily for headaches.   magnesium hydroxide (MILK OF MAGNESIA) 400 MG/5ML suspension Take by mouth as needed for mild constipation. 2 tablespoons   melatonin 3 MG TABS tablet Take 3 mg by mouth at bedtime.   midodrine  (PROAMATINE ) 5 MG tablet Take 1 tablet (5 mg total) by mouth 3 (three) times daily with meals. Take it at 7am, 11am & 3 pm   multivitamin-iron-minerals-folic acid (THERAPEUTIC-M) TABS tablet Take 1 tablet by mouth daily.   nystatin (MYCOSTATIN/NYSTOP) powder Apply 1 Application topically as needed.   olopatadine (PATADAY) 0.1 % ophthalmic solution Place 1 drop into both eyes as needed.   ondansetron  (ZOFRAN ) 4 MG tablet Take 4 mg by mouth 3 (three) times daily as needed for nausea or vomiting.   OXYGEN 2lmp for dyspnea or SOB   polyethylene glycol powder (GLYCOLAX /MIRALAX ) 17 GM/SCOOP powder Take 1 Container by mouth every other day. Give one scoop by mouth as needed.   pravastatin  (PRAVACHOL ) 10 MG tablet Take 1 tablet (10 mg total) by mouth at bedtime. Take a whole pill for pill pack and delivery   Psyllium (METAMUCIL) WAFR Take 1 Wafer by mouth as needed.   Simethicone  (GAS-X PO) Give one capsule by mouth as needed after meals and at bedtime.   sodium chloride  1 g tablet Take 4 g by mouth in the morning and at bedtime.   timolol  (TIMOPTIC ) 0.5 % ophthalmic solution Place 1 drop into the left eye at bedtime.   triamcinolone  cream (KENALOG ) 0.1 % Apply 1 Application topically  daily as needed.   vitamin B-12 (CYANOCOBALAMIN ) 100 MCG tablet Take 400 mcg by mouth daily.   Wheat Dextrin (BENEFIBER PO) Take 1 Scoop by mouth daily.   risedronate (ACTONEL) 150 MG tablet Take 150 mg by mouth every 30 (thirty) days. with water  on empty stomach, nothing by mouth or lie down for next 30 minutes. (Patient not taking: Reported on 02/21/2024)   No facility-administered encounter medications on file as of 02/21/2024.  Review of Systems  Immunization History  Administered Date(s) Administered   Fluad Quad(high Dose 65+) 05/10/2019, 05/22/2020   Influenza Split 05/21/2011, 05/31/2012   Influenza Whole 06/02/2010   Influenza, High Dose Seasonal PF 05/24/2014   Influenza,inj,Quad PF,6+ Mos 04/04/2015, 05/06/2016, 05/13/2017, 05/10/2018   Influenza-Unspecified 05/20/2022, 05/21/2023   Moderna Covid-19 Fall Seasonal Vaccine 32yrs & older 11/10/2022   Moderna SARS-COV2 Booster Vaccination 12/09/2020   Moderna Sars-Covid-2 Vaccination 08/17/2019, 09/14/2019, 06/18/2020, 04/24/2021, 12/30/2021   Pneumococcal Conjugate-13 10/02/2013   Pneumococcal Polysaccharide-23 04/04/2015   Td 04/18/2008   Tdap 10/30/2015   Unspecified SARS-COV-2 Vaccination 06/12/2022, 04/30/2023, 11/12/2023   Zoster Recombinant(Shingrix) 01/27/2018, 03/13/2019   Zoster, Live 04/18/2008   Pertinent  Health Maintenance Due  Topic Date Due   FOOT EXAM  Never done   OPHTHALMOLOGY EXAM  Never done   HEMOGLOBIN A1C  08/16/2020   INFLUENZA VACCINE  03/03/2024   DEXA SCAN  Completed   Colonoscopy  Discontinued      12/26/2020   11:44 AM 02/20/2021    1:17 PM 10/18/2021    4:29 PM 12/30/2022   10:40 AM 01/27/2024    2:10 PM  Fall Risk  Falls in the past year? 0 0  1 0  Was there an injury with Fall? 0 0  0   Fall Risk Category Calculator 0 0  2   Fall Risk Category (Retired) Low  Low      (RETIRED) Patient Fall Risk Level Low fall risk  Low fall risk  Low fall risk     Patient at Risk for Falls Due to     History of fall(s);Impaired balance/gait   Fall risk Follow up Falls evaluation completed  Falls evaluation completed   Education provided      Data saved with a previous flowsheet row definition   Functional Status Survey:    Vitals:   02/21/24 1339  BP: 112/60  Pulse: 70  Resp: 17  Temp: 97.8 F (36.6 C)  SpO2: 91%  Weight: 197 lb 8 oz (89.6 kg)  Height: 5' 9 (1.753 m)   Body mass index is 29.17 kg/m. Physical Exam Constitutional:      Appearance: Normal appearance.  Cardiovascular:     Rate and Rhythm: Normal rate and regular rhythm.     Pulses: Normal pulses.     Heart sounds: Normal heart sounds.  Pulmonary:     Effort: Pulmonary effort is normal.  Abdominal:     General: Abdomen is flat. Bowel sounds are normal.     Palpations: Abdomen is soft.  Musculoskeletal:        General: No swelling or tenderness.  Skin:    General: Skin is warm and dry.  Neurological:     Mental Status: She is alert and oriented to person, place, and time.     Gait: Gait normal.  Psychiatric:     Comments: Tearful when describing her mood.      Labs reviewed: Recent Labs    08/24/23 0000 12/16/23 0000 12/23/23 0000  NA 136* 131* 135*  K 4.6 4.4 4.7  CL 101 97* 100  CO2 28* 26* 26*  BUN 24* 18 20  CREATININE 0.7 0.6 0.6  CALCIUM 9.2 8.4* 8.5*   Recent Labs    08/24/23 0000  AST 17  ALT 13  ALKPHOS 64  ALBUMIN 4.4   Recent Labs    08/24/23 0000 12/16/23 0000  WBC 5.1 3.2  NEUTROABS 2,407.00 1,027.00  HGB 13.8 12.1  HCT 42  37  PLT 214 165   Lab Results  Component Value Date   TSH 2.98 01/04/2023   Lab Results  Component Value Date   HGBA1C 5.6 02/14/2020   Lab Results  Component Value Date   CHOL 202 (A) 08/24/2023   HDL 105 (A) 08/24/2023   LDLCALC 75 06/12/2022   LDLDIRECT 123.5 09/18/2013   TRIG 82 08/24/2023   CHOLHDL 2 07/02/2020    Significant Diagnostic Results in last 30 days:  No results found.  Assessment/Plan Depression Chronic  depression with recent exacerbation, characterized by increased sadness, desire for isolation, and difficulty managing daily activities. No specific trigger identified, but acknowledges long-standing depression and significant life events affecting mood. Currently on Abilify, Lexapro , and lorazepam . Has not seen a psychiatrist recently due to logistical challenges. Discussed potential benefits of increasing medication dosage and emphasized combining medication with therapy for optimal outcomes. Encouraged engagement in daily activities and exercise as coping strategies. Consideration of seasonal affective disorder and impact of anniversaries of significant losses on mood. - Increase dose of Abilify. - Maintain current dose of Lexapro . - Continue lorazepam  as prescribed. - Encourage participation in exercise and daily activities.  Family/ staff Communication: nursing  Labs/tests ordered:  none   I spent greater than 30 minutes for the care of this patient in face to face time, chart review, clinical documentation, patient education.

## 2024-02-23 DIAGNOSIS — F331 Major depressive disorder, recurrent, moderate: Secondary | ICD-10-CM | POA: Diagnosis not present

## 2024-02-29 DIAGNOSIS — F331 Major depressive disorder, recurrent, moderate: Secondary | ICD-10-CM | POA: Diagnosis not present

## 2024-03-06 DIAGNOSIS — L570 Actinic keratosis: Secondary | ICD-10-CM | POA: Diagnosis not present

## 2024-03-07 DIAGNOSIS — F331 Major depressive disorder, recurrent, moderate: Secondary | ICD-10-CM | POA: Diagnosis not present

## 2024-03-09 DIAGNOSIS — R4189 Other symptoms and signs involving cognitive functions and awareness: Secondary | ICD-10-CM | POA: Diagnosis not present

## 2024-03-09 DIAGNOSIS — G3184 Mild cognitive impairment, so stated: Secondary | ICD-10-CM | POA: Diagnosis not present

## 2024-03-09 DIAGNOSIS — F419 Anxiety disorder, unspecified: Secondary | ICD-10-CM | POA: Diagnosis not present

## 2024-03-09 DIAGNOSIS — R278 Other lack of coordination: Secondary | ICD-10-CM | POA: Diagnosis not present

## 2024-03-16 ENCOUNTER — Other Ambulatory Visit: Payer: Self-pay | Admitting: Student

## 2024-03-16 DIAGNOSIS — F331 Major depressive disorder, recurrent, moderate: Secondary | ICD-10-CM

## 2024-03-16 NOTE — Telephone Encounter (Signed)
 Pharmacy requested refill.  Pended Rx and sent to Dr. Sydnee Cabal for approval .

## 2024-03-18 DIAGNOSIS — F331 Major depressive disorder, recurrent, moderate: Secondary | ICD-10-CM | POA: Diagnosis not present

## 2024-03-21 DIAGNOSIS — F331 Major depressive disorder, recurrent, moderate: Secondary | ICD-10-CM | POA: Diagnosis not present

## 2024-03-28 DIAGNOSIS — F331 Major depressive disorder, recurrent, moderate: Secondary | ICD-10-CM | POA: Diagnosis not present

## 2024-04-03 DIAGNOSIS — R2689 Other abnormalities of gait and mobility: Secondary | ICD-10-CM | POA: Diagnosis not present

## 2024-04-03 DIAGNOSIS — F419 Anxiety disorder, unspecified: Secondary | ICD-10-CM | POA: Diagnosis not present

## 2024-04-03 DIAGNOSIS — R2681 Unsteadiness on feet: Secondary | ICD-10-CM | POA: Diagnosis not present

## 2024-04-03 DIAGNOSIS — Z741 Need for assistance with personal care: Secondary | ICD-10-CM | POA: Diagnosis not present

## 2024-04-03 DIAGNOSIS — R278 Other lack of coordination: Secondary | ICD-10-CM | POA: Diagnosis not present

## 2024-04-03 DIAGNOSIS — G3184 Mild cognitive impairment, so stated: Secondary | ICD-10-CM | POA: Diagnosis not present

## 2024-04-03 DIAGNOSIS — R4189 Other symptoms and signs involving cognitive functions and awareness: Secondary | ICD-10-CM | POA: Diagnosis not present

## 2024-04-06 DIAGNOSIS — F331 Major depressive disorder, recurrent, moderate: Secondary | ICD-10-CM | POA: Diagnosis not present

## 2024-04-10 DIAGNOSIS — H40052 Ocular hypertension, left eye: Secondary | ICD-10-CM | POA: Diagnosis not present

## 2024-04-10 DIAGNOSIS — Z961 Presence of intraocular lens: Secondary | ICD-10-CM | POA: Diagnosis not present

## 2024-04-17 DIAGNOSIS — R4182 Altered mental status, unspecified: Secondary | ICD-10-CM | POA: Diagnosis not present

## 2024-04-17 DIAGNOSIS — F419 Anxiety disorder, unspecified: Secondary | ICD-10-CM | POA: Diagnosis not present

## 2024-04-17 DIAGNOSIS — L570 Actinic keratosis: Secondary | ICD-10-CM | POA: Diagnosis not present

## 2024-04-24 ENCOUNTER — Other Ambulatory Visit: Payer: Self-pay | Admitting: Adult Health

## 2024-04-24 DIAGNOSIS — F0280 Dementia in other diseases classified elsewhere without behavioral disturbance: Secondary | ICD-10-CM | POA: Diagnosis not present

## 2024-04-24 DIAGNOSIS — F331 Major depressive disorder, recurrent, moderate: Secondary | ICD-10-CM | POA: Diagnosis not present

## 2024-04-24 DIAGNOSIS — F411 Generalized anxiety disorder: Secondary | ICD-10-CM | POA: Diagnosis not present

## 2024-04-25 DIAGNOSIS — R079 Chest pain, unspecified: Secondary | ICD-10-CM | POA: Diagnosis not present

## 2024-04-26 ENCOUNTER — Encounter: Payer: Self-pay | Admitting: Internal Medicine

## 2024-04-26 ENCOUNTER — Non-Acute Institutional Stay: Payer: Self-pay | Admitting: Internal Medicine

## 2024-04-26 DIAGNOSIS — F331 Major depressive disorder, recurrent, moderate: Secondary | ICD-10-CM | POA: Diagnosis not present

## 2024-04-26 DIAGNOSIS — E0849 Diabetes mellitus due to underlying condition with other diabetic neurological complication: Secondary | ICD-10-CM

## 2024-04-26 DIAGNOSIS — I4819 Other persistent atrial fibrillation: Secondary | ICD-10-CM

## 2024-04-26 DIAGNOSIS — F0390 Unspecified dementia without behavioral disturbance: Secondary | ICD-10-CM | POA: Diagnosis not present

## 2024-04-26 NOTE — Progress Notes (Unsigned)
 NURSING HOME LOCATION:  [facility]  ROOM NUMBER:  [id number]  CODE STATUS:  Full Code.   PCP:  Dr. Richerd Brigham  This is a comprehensive admission note to this ALF performed on this date less than 30 days from date of admission. Included are preadmission medical/surgical history; reconciled medication list; family history; social history and comprehensive review of systems.  Corrections and additions to the records were documented. Comprehensive physical exam was also performed. Additionally a clinical summary was entered for each active diagnosis pertinent to this admission in the Problem List to enhance continuity of care.  HPI: She is being transferred to Assisted Living facility because of increasing anxiety in the context of memory deficits.  Memory deficit is pronounced.   Psychiatric evaluation has been performed by Dr. Viviane Colas who is diagnosed major depression, recurrent, moderate with generalized anxiety disorder.  He has prescribed Abilify 5 mg at bedtime; Ativan  0.5 mg 1 twice daily and 1/2 tablet nightly; Lexapro  5 mg daily x 5 days as a taper; and Zoloft 25 mg 1 daily with breakfast.  Escitalopram  10 mg was discontinued.  EKG was performed because of the initiation of Abilify.  This was personally reviewed by me.  It reveals controlled rate with atrial fibrillation and nonspecific, ST-T changes.  There is no prolongation of his QT interval.  Past medical and surgical history: Includes history of actinic keratoses; GERD; atrial fibrillation; history of basal cell carcinoma; history of stroke; glaucoma; hypothyroidism; RLS; and history of ventricular tachycardia. Surgeries and procedures include atrial fibrillation for which she has had ablation x 2; history of distal radial fracture; pancreatectomy; and vaginal hysterectomy.  Family history: reviewed, non contributory due to advanced age.  Social history: History of social alcohol intake; nonsmoker.  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   Review of systems: Clinical neurocognitive deficits made validity of responses questionable , compromising ROS completion. When I asked her if she were having any acute issues her response was yes my bra was too tight.  She later.  The same complaint.  She went on to confabulate I am ready to go home, hopefully another opening in another location.  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  She could not tell me the name of the college she attended and initially answered [employer].  Subsequently she thought it might have been the [employer] [employer] [place] .  When asked about anxiety depression or her response was I would rather go back to a different unit.  She validated that she used to have migraines but these have resolved.   Constitutional: No fever, significant weight change, fatigue  Eyes: No redness, discharge, pain, vision change ENT/mouth: No nasal congestion, purulent discharge, earache, change in hearing, sore throat  Cardiovascular: No chest pain, palpitations, paroxysmal nocturnal dyspnea, claudication, edema  Respiratory: No cough, sputum production, hemoptysis, DOE, significant snoring, apnea Gastrointestinal: No heartburn, dysphagia, abdominal pain, nausea /vomiting, rectal bleeding, melena, change in bowels Genitourinary: No dysuria, hematuria, pyuria, incontinence, nocturia Musculoskeletal: No joint stiffness, joint swelling, weakness, pain Dermatologic: No rash, pruritus, change in appearance of skin Neurologic: No dizziness, headache, syncope, seizures, numbness, tingling Psychiatric: No significant anxiety, depression, insomnia, anorexia Endocrine: No change in hair/skin/nails, excessive thirst, excessive hunger, excessive urination  Hematologic/lymphatic: No significant bruising,  lymphadenopathy, abnormal bleeding Allergy/immunology:  No itchy/watery eyes, significant sneezing, urticaria, angioedema  Physical exam:  Pertinent or positive findings: Affect is flat.  Slight ptosis is present bilaterally.  Dental hygiene is immaculate.  She appears to have slight deviation of the head and neck in the context of slight asymmetry of the inferior neck with the right base visibly larger.  This may be related to what is apparently scoliosis with lateral displacement of the spine.  Pedal pulses are decreased.  She has 1/2+ edema at the sock line.  Deep tendon reflexes are 1.5+ in the upper extremities and 1/2+ at the knees.  There are some fusiform enlargement of the knees suggested.  General appearance: Adequately nourished; no acute distress, increased work of breathing is present.   Lymphatic: No lymphadenopathy about the head, neck, axilla. Eyes: No conjunctival inflammation or lid edema is present. There is no scleral icterus. Ears:  External ear exam shows no significant lesions or deformities.   Nose:  External nasal examination shows no deformity or inflammation. Nasal mucosa are pink and moist without lesions, exudates Oral exam: Lips and gums are healthy appearing.There is no oropharyngeal erythema or exudate. Neck:  No thyromegaly, masses, tenderness noted.    Heart:  Normal rate and regular rhythm. S1 and S2 normal without gallop, murmur, click, rub.  Lungs: Chest clear to auscultation without wheezes, rhonchi, rales, rubs. Abdomen: Bowel sounds are normal.  Abdomen is soft and nontender with no organomegaly, hernias, masses. GU: Deferred  Extremities:  No cyanosis, clubbing, edema. Neurologic exam:  Strength equal  in upper & lower extremities. Balance, Rhomberg, finger to nose testing could not be completed due to clinical state Deep tendon reflexes are equal Skin: Warm & dry w/o tenting. No significant lesions or rash.  See clinical summary under each active  problem in the Problem List with associated updated therapeutic plan

## 2024-04-26 NOTE — Patient Instructions (Signed)
 See assessment and plan under each diagnosis in the problem list and acutely for this visit

## 2024-04-27 ENCOUNTER — Encounter: Payer: Self-pay | Admitting: Pharmacist

## 2024-04-27 NOTE — Progress Notes (Signed)
   04/27/2024  Patient ID: Monica Whitaker, female   DOB: 03/13/1946, 78 y.o.   MRN: [id number]  Pharmacy Quality Measure Review  This patient is appearing on a report for being at risk of failing the adherence measure for cholesterol (statin) medications this calendar year.   Medication: Pravastatin  10 mg Last fill date: 02/21/24  for 29 day supply  Patient lives in a facility and gets her medications filled at [facility] which is a long term care Pharmacy. They dispense weekly cycle packs for Patients then do monthly composite billing.  Pravastatin  has been filled monthly for > 2 years.  Monica Whitaker, PharmD, BCACP Clinical Pharmacist [phone] '

## 2024-04-28 NOTE — Assessment & Plan Note (Signed)
 04/26/2024 EKG performed prior to initiation of Abilify.  No prolongation of the QT interval.  Atrial fibrillation with controlled rate and nonspecific ST-T wave changes documented.  EKG personally reviewed.

## 2024-04-28 NOTE — Assessment & Plan Note (Signed)
 Dr. Gara has prescribed Abilify 5 mg nightly; Ativan  0.5 twice a day and one half nightly.  Additionally Lexapro  5 mg daily x 5 days as a taper and Zoloft 25 mg daily with breakfast.  Escitalopram  10 mg was discontinued.

## 2024-04-28 NOTE — Assessment & Plan Note (Signed)
 Most recent A1c on record was 5.6%, upper limits of normal on 02/14/2020.  Most recent random glucoses were within normal limits.

## 2024-04-28 NOTE — Assessment & Plan Note (Signed)
 See 04/26/2024 she could provide no meaningful history and confabulated nonsensically.  MMSE should be updated at SNF to assess severity of neurocognitive disorder.  Clinically severe dementia is suspect.

## 2024-05-02 DIAGNOSIS — M25511 Pain in right shoulder: Secondary | ICD-10-CM | POA: Diagnosis not present

## 2024-05-03 DIAGNOSIS — R2681 Unsteadiness on feet: Secondary | ICD-10-CM | POA: Diagnosis not present

## 2024-05-03 DIAGNOSIS — R278 Other lack of coordination: Secondary | ICD-10-CM | POA: Diagnosis not present

## 2024-05-03 DIAGNOSIS — R2689 Other abnormalities of gait and mobility: Secondary | ICD-10-CM | POA: Diagnosis not present

## 2024-05-03 DIAGNOSIS — F419 Anxiety disorder, unspecified: Secondary | ICD-10-CM | POA: Diagnosis not present

## 2024-05-03 DIAGNOSIS — R4189 Other symptoms and signs involving cognitive functions and awareness: Secondary | ICD-10-CM | POA: Diagnosis not present

## 2024-05-03 DIAGNOSIS — Z741 Need for assistance with personal care: Secondary | ICD-10-CM | POA: Diagnosis not present

## 2024-05-03 DIAGNOSIS — G3184 Mild cognitive impairment, so stated: Secondary | ICD-10-CM | POA: Diagnosis not present

## 2024-05-08 ENCOUNTER — Non-Acute Institutional Stay (SKILLED_NURSING_FACILITY): Payer: Self-pay | Admitting: Adult Health

## 2024-05-08 ENCOUNTER — Encounter: Payer: Self-pay | Admitting: Adult Health

## 2024-05-08 DIAGNOSIS — M810 Age-related osteoporosis without current pathological fracture: Secondary | ICD-10-CM | POA: Diagnosis not present

## 2024-05-08 DIAGNOSIS — I951 Orthostatic hypotension: Secondary | ICD-10-CM | POA: Diagnosis not present

## 2024-05-08 DIAGNOSIS — F0393 Unspecified dementia, unspecified severity, with mood disturbance: Secondary | ICD-10-CM | POA: Diagnosis not present

## 2024-05-08 DIAGNOSIS — R4189 Other symptoms and signs involving cognitive functions and awareness: Secondary | ICD-10-CM | POA: Diagnosis not present

## 2024-05-08 DIAGNOSIS — F419 Anxiety disorder, unspecified: Secondary | ICD-10-CM | POA: Diagnosis not present

## 2024-05-08 DIAGNOSIS — E871 Hypo-osmolality and hyponatremia: Secondary | ICD-10-CM | POA: Diagnosis not present

## 2024-05-08 DIAGNOSIS — F418 Other specified anxiety disorders: Secondary | ICD-10-CM | POA: Diagnosis not present

## 2024-05-08 DIAGNOSIS — R41841 Cognitive communication deficit: Secondary | ICD-10-CM | POA: Diagnosis not present

## 2024-05-08 DIAGNOSIS — I69398 Other sequelae of cerebral infarction: Secondary | ICD-10-CM | POA: Diagnosis not present

## 2024-05-08 DIAGNOSIS — G629 Polyneuropathy, unspecified: Secondary | ICD-10-CM

## 2024-05-08 DIAGNOSIS — Z8673 Personal history of transient ischemic attack (TIA), and cerebral infarction without residual deficits: Secondary | ICD-10-CM | POA: Diagnosis not present

## 2024-05-08 DIAGNOSIS — F329 Major depressive disorder, single episode, unspecified: Secondary | ICD-10-CM | POA: Diagnosis not present

## 2024-05-08 DIAGNOSIS — E039 Hypothyroidism, unspecified: Secondary | ICD-10-CM

## 2024-05-08 DIAGNOSIS — I480 Paroxysmal atrial fibrillation: Secondary | ICD-10-CM | POA: Diagnosis not present

## 2024-05-08 DIAGNOSIS — E782 Mixed hyperlipidemia: Secondary | ICD-10-CM

## 2024-05-08 DIAGNOSIS — Z9181 History of falling: Secondary | ICD-10-CM | POA: Diagnosis not present

## 2024-05-08 DIAGNOSIS — Z741 Need for assistance with personal care: Secondary | ICD-10-CM | POA: Diagnosis not present

## 2024-05-08 NOTE — Progress Notes (Signed)
 Location:  Other [facility]) Nursing Home Room Number: [place] [address] [facility]) Place of Service:  SNF (31) Provider:  Medina-Vargas, Json Koelzer, DNP, FNP-BC  Patient Care Team: Abdul Fine, MD as PCP - General (Family Medicine) Fernande Elspeth BROCKS, MD as PCP - Electrophysiology (Cardiology) Dow Arland BROCKS, NP (Inactive) as Nurse Practitioner (Nurse Practitioner) Kelsie Agent, MD (Inactive) as Consulting Physician (Cardiology) Camella Fallow, MD as Consulting Physician (Orthopedic Surgery) Jeri Donnice Burkes, MD as Referring Physician (Gastroenterology) Lenn Elspeth, MD as Consulting Physician (Ophthalmology) Caro Harlene POUR, NP as Nurse Practitioner (Geriatric Medicine)  Extended Emergency Contact Information Primary Emergency Contact: [name],[name] Address: [address]          [place], [place] [address] [place] Mobile Phone: [phone] Relation: Relative Secondary Emergency Contact: [name] Address: [address] [place], [place] [address] [name] Phone: [phone] Relation: Other  Code Status:  Full Code  Goals of care: Advanced Directive information    05/08/2024   12:45 PM  Advanced Directives  Does Patient Have a Medical Advance Directive? Yes  Type of Estate agent of [place];Living will  Does patient want to make changes to medical advance directive? No - Patient declined  Copy of Healthcare Power of Attorney in Chart? Yes - validated most recent copy scanned in chart (See row information)     Chief Complaint  Patient presents with   New Admit To SNF    [facility]    HPI:  Pt is a 78 y.o. female seen today to follow admission to [facility] from [place] (memory Care) on 05/08/24.  She was living at [facility] and was noted to have a cognitive decline. She was admitted to [facility] on 04/14/24  for respite care and transferred to [facility] on 05/08/24.  Paroxysmal atrial fibrillation ([name]) -  takes Apixaban , Flecainide   Age-related osteoporosis without current pathological fracture - bone density T-score -3.0,  takes Alendronate  Neuropathy -  takes Gabapentin   Acquired hypothyroidism -  tsh 1.43, takes Levothyroxine   Orthostatic hypotension -  BP 130/84, takes Midodrine   Mixed hyperlipidemia -  takes Pravastatin   Hyponatremia -  takes Sodium Chloride  1gm half tab  Depression with anxiety - takes Zoloft, Abilify and Lorazepam   Moderate cognitive impairment -  BIMS score 11/15, ranging as moderate cognitive deficit  Past Medical History:  Diagnosis Date   Actinic keratosis 12/20/2012   left nasal tip   Anxiety    controlled with meds   Anxiety    Arrhythmia    Atrial fibrillation ([name])    paroxysmal, failed medical therapy with flecainide ,  NSVT with tikosyn   Basal cell carcinoma 12/20/2012   left proximal mandible   Bruises easily    CVA (cerebral vascular accident) ([name]) 12/2007   Depression    medically controlled    Glaucoma    also with macular holes AE Dr. Dingledein   History of loop recorder    Hypothyroidism    Low blood pressure    no falls, but if gets up to fast   RLS (restless legs syndrome)    Stroke ([name]) 7 years ago   Lasting effects on balance, different personality.   Thyroid  disease    Tremor    Ventricular tachycardia ([name])    pt told at [facility] that she had RVOT VT  Past Surgical History:  Procedure Laterality Date   ATRIAL FIBRILLATION ABLATION  10/18/12   PVI by Dr Kelsie   ATRIAL FIBRILLATION ABLATION N/A 10/18/2012   Procedure: ATRIAL FIBRILLATION ABLATION;  Surgeon: Lynwood Kelsie, MD;  Location: [facility] CATH LAB;  Service: Cardiovascular;  Laterality: N/A;   ATRIAL FIBRILLATION ABLATION N/A 04/18/2019   Procedure: ATRIAL FIBRILLATION ABLATION;  Surgeon: Kelsie Lynwood, MD;  Location: [facility] INVASIVE CV LAB;  Service: Cardiovascular;   Laterality: N/A;   EUS N/A 10/19/2019   Procedure: FULL UPPER ENDOSCOPIC ULTRASOUND (EUS) RADIAL;  Surgeon: Queenie Asberry LABOR, MD;  Location: [facility] ENDOSCOPY;  Service: Gastroenterology;  Laterality: N/A;   EYE SURGERY     on L/R eye, macular hole    FRACTURE SURGERY     implantable loop recorder placement  07/13/2019   MDT Reveal LINQ1 [facility] [id number]) implanted in office by Dr Kelsie for afib management post ablation   OPEN REDUCTION INTERNAL FIXATION (ORIF) DISTAL RADIAL FRACTURE Right 11/15/2014   Procedure: OPEN REDUCTION INTERNAL FIXATION (ORIF) RIGHT DISTAL RADIAL FRACTURE WITH ALLOGRAFT BONE GRAFT;  Surgeon: Elsie Mussel, MD;  Location: [facility];  Service: Orthopedics;  Laterality: Right;   PANCREATECTOMY N/A 02/13/2020   Procedure: LAPAROSCOPIC DISTAL PANCREATECTOMY;  Surgeon: Aron Shoulders, MD;  Location: [facility] OR;  Service: General;  Laterality: N/A;   TEE WITHOUT CARDIOVERSION N/A 10/18/2012   Procedure: TRANSESOPHAGEAL ECHOCARDIOGRAM (TEE);  Surgeon: Redell GORMAN Shallow, MD;  Location: [facility] ENDOSCOPY;  Service: Cardiovascular;  Laterality: N/A;  Pre-Ablation at 12pm   TONSILLECTOMY     VAGINAL HYSTERECTOMY      Allergies  Allergen Reactions   Darvon Other (See Comments)    Severe panic attacks   Propoxyphene Other (See Comments)    GI Upset Severe panic attacks GI Upset   Propoxyphene N-Acetaminophen  Other (See Comments)    Severe panic attacks   Levofloxacin Anxiety and Other (See Comments)    Causes panic attacks.    Outpatient Encounter Medications as of 05/08/2024  Medication Sig   acetaminophen  (TYLENOL ) 500 MG tablet Take 1,000 mg by mouth 2 (two) times daily.   alendronate (FOSAMAX) 70 MG tablet Take 70 mg by mouth once a week. Take with a full glass of water  on an empty stomach.   apixaban  (ELIQUIS ) 5 MG TABS tablet Take 1 tablet (5 mg total) by mouth in the morning and at bedtime.   ARIPiprazole (ABILIFY) 5 MG tablet Take 5 mg by mouth at bedtime.    Cranberry 425 MG CAPS Take 2 capsules by mouth at bedtime.   escitalopram  (LEXAPRO ) 5 MG tablet Take 5 mg by mouth daily.   flecainide  (TAMBOCOR ) 100 MG tablet Take 100 mg by mouth 2 (two) times daily.   gabapentin  (NEURONTIN ) 100 MG capsule Take 200 mg by mouth 2 (two) times daily.   Levothyroxine  Sodium 25 MCG CAPS Take 1 capsule by mouth at bedtime.   LORazepam  (ATIVAN ) 0.5 MG tablet Take 0.5 mg by mouth every 8 (eight) hours as needed for anxiety. Give one tablet by mouth twice daily. Give 0.5 tablet by mouth at bedtime.   MAGNESIUM GLYCINATE PO Give 600mg  by mouth once daily for headaches.   melatonin 3 MG TABS tablet Take 3 mg by mouth at bedtime.   midodrine  (PROAMATINE ) 5 MG tablet Take 1 tablet (5 mg total) by mouth 3 (three) times daily with meals. Take it at 7am, 11am & 3 pm   multivitamin-iron-minerals-folic acid (THERAPEUTIC-M) TABS tablet Take 1 tablet by mouth daily.  pravastatin  (PRAVACHOL ) 10 MG tablet Take 1 tablet (10 mg total) by mouth at bedtime. Take a whole pill for pill pack and delivery   Riboflavin 100 MG TABS Take 4 tablets by mouth daily.   sertraline (ZOLOFT) 25 MG tablet Take 25 mg by mouth every morning.   sodium chloride  1 g tablet Take 0.5 tablets by mouth in the morning and at bedtime.   timolol  (TIMOPTIC ) 0.5 % ophthalmic solution Place 1 drop into the left eye at bedtime.   Wheat Dextrin (BENEFIBER PO) Take 1 Scoop by mouth daily.   acetaminophen  (TYLENOL ) 325 MG tablet Take 650 mg by mouth every 4 (four) hours as needed. (Patient not taking: Reported on 05/08/2024)   aluminum-magnesium hydroxide 200-200 MG/5ML suspension Take 10 mLs by mouth every 4 (four) hours as needed for indigestion. (Patient not taking: Reported on 05/08/2024)   ARIPiprazole (ABILIFY) 2 MG tablet Take 2 mg by mouth at bedtime. (Patient not taking: Reported on 05/08/2024)   bismuth subsalicylate (PEPTO BISMOL) 262 MG/15ML suspension Take 10 mLs by mouth as needed. (Patient not taking:  Reported on 05/08/2024)   carbamide peroxide (DEBROX) 6.5 % OTIC solution Place 5 drops into both ears as needed. (Patient not taking: Reported on 05/08/2024)   cetirizine (ZYRTEC) 10 MG tablet Take 10 mg by mouth daily as needed for allergies. (Patient not taking: Reported on 05/08/2024)   dextromethorphan-guaiFENesin (ROBITUSSIN-DM) 10-100 MG/5ML liquid Take 5 mLs by mouth every 4 (four) hours as needed for cough. (Patient not taking: Reported on 05/08/2024)   escitalopram  (LEXAPRO ) 10 MG tablet TAKE 1 TABLET BY MOUTH ONCE DAILY (Patient not taking: Reported on 05/08/2024)   Glucose 15 GM/32ML GEL Give one packet by mouth as needed. (Patient not taking: Reported on 05/08/2024)   LORazepam  (ATIVAN ) 0.5 MG tablet TAKE 1 TABLET  BY MOUTH THREE TIMES A DAY (Patient not taking: Reported on 05/08/2024)   magnesium hydroxide (MILK OF MAGNESIA) 400 MG/5ML suspension Take by mouth as needed for mild constipation. 2 tablespoons (Patient not taking: Reported on 05/08/2024)   nystatin (MYCOSTATIN/NYSTOP) powder Apply 1 Application topically as needed. (Patient not taking: Reported on 05/08/2024)   olopatadine (PATADAY) 0.1 % ophthalmic solution Place 1 drop into both eyes as needed. (Patient not taking: Reported on 05/08/2024)   ondansetron  (ZOFRAN ) 4 MG tablet Take 4 mg by mouth 3 (three) times daily as needed for nausea or vomiting. (Patient not taking: Reported on 05/08/2024)   OXYGEN 2lmp for dyspnea or SOB (Patient not taking: Reported on 05/08/2024)   polyethylene glycol powder (GLYCOLAX /MIRALAX ) 17 GM/SCOOP powder Take 1 Container by mouth every other day. Give one scoop by mouth as needed. (Patient not taking: Reported on 05/08/2024)   Psyllium (METAMUCIL) WAFR Take 1 Wafer by mouth as needed. (Patient not taking: Reported on 05/08/2024)   Simethicone  (GAS-X PO) Give one capsule by mouth as needed after meals and at bedtime. (Patient not taking: Reported on 05/08/2024)   triamcinolone  cream (KENALOG ) 0.1 % Apply 1  Application topically daily as needed. (Patient not taking: Reported on 05/08/2024)   vitamin B-12 (CYANOCOBALAMIN ) 100 MCG tablet Take 400 mcg by mouth daily. (Patient not taking: Reported on 05/08/2024)   No facility-administered encounter medications on file as of 05/08/2024.    Review of Systems  Unable to obtain, was just admitted to [facility] stating that she does not belong there.    Immunization History  Administered Date(s) Administered   Fluad Quad(high Dose 65+) 05/10/2019, 05/22/2020   INFLUENZA, HIGH DOSE SEASONAL  PF 05/24/2014   Influenza Split 05/21/2011, 05/31/2012   Influenza Whole 06/02/2010   Influenza,inj,Quad PF,6+ Mos 04/04/2015, 05/06/2016, 05/13/2017, 05/10/2018   Influenza-Unspecified 05/20/2022, 05/21/2023   Moderna Covid-19 Fall Seasonal Vaccine 78yrs & older 11/10/2022   Moderna SARS-COV2 Booster Vaccination 12/09/2020   Moderna Sars-Covid-2 Vaccination 08/17/2019, 09/14/2019, 06/18/2020, 04/24/2021, 12/30/2021   Pneumococcal Conjugate-13 10/02/2013   Pneumococcal Polysaccharide-23 04/04/2015   Td 04/18/2008   Tdap 10/30/2015   Unspecified SARS-COV-2 Vaccination 06/12/2022, 04/30/2023, 11/12/2023   Zoster Recombinant(Shingrix) 01/27/2018, 03/13/2019   Zoster, Live 04/18/2008   Pertinent  Health Maintenance Due  Topic Date Due   FOOT EXAM  Never done   HEMOGLOBIN A1C  08/16/2020   Influenza Vaccine  03/03/2024   OPHTHALMOLOGY EXAM  01/16/2025   DEXA SCAN  Completed   Mammogram  Discontinued   Colonoscopy  Discontinued      12/26/2020   11:44 AM 02/20/2021    1:17 PM 10/18/2021    4:29 PM 12/30/2022   10:40 AM 01/27/2024    2:10 PM  Fall Risk  Falls in the past year? 0 0  1 0  Was there an injury with Fall? 0 0  0   Fall Risk Category Calculator 0 0  2   Fall Risk Category (Retired) Low  Low      (RETIRED) Patient Fall Risk Level Low fall risk  Low fall risk  Low fall risk     Patient at Risk for Falls Due to    History of fall(s);Impaired  balance/gait   Fall risk Follow up Falls evaluation completed  Falls evaluation completed   Education provided      Data saved with a previous flowsheet row definition     Vitals:   05/08/24 1242  BP: 130/84  Pulse: 97  Resp: 18  Temp: (!) 97.1 F (36.2 C)  SpO2: 98%  Weight: 178 lb (80.7 kg)  Height: 5' 9 (1.753 m)   Body mass index is 26.29 kg/m.  Physical Exam Constitutional:      Appearance: Normal appearance.  HENT:     Head: Normocephalic and atraumatic.     Nose: Nose normal.     Mouth/Throat:     Mouth: Mucous membranes are moist.  Eyes:     Conjunctiva/sclera: Conjunctivae normal.  Cardiovascular:     Rate and Rhythm: Normal rate and regular rhythm.  Pulmonary:     Effort: Pulmonary effort is normal.     Breath sounds: Normal breath sounds.  Abdominal:     General: Bowel sounds are normal.     Palpations: Abdomen is soft.  Musculoskeletal:        General: Normal range of motion.     Cervical back: Normal range of motion.  Skin:    General: Skin is warm and dry.  Neurological:     Mental Status: She is alert.  Psychiatric:        Mood and Affect: Mood normal.        Behavior: Behavior normal.      Labs reviewed: Recent Labs    08/24/23 0000 12/16/23 0000 12/23/23 0000  NA 136* 131* 135*  K 4.6 4.4 4.7  CL 101 97* 100  CO2 28* 26* 26*  BUN 24* 18 20  CREATININE 0.7 0.6 0.6  CALCIUM 9.2 8.4* 8.5*   Recent Labs    08/24/23 0000  AST 17  ALT 13  ALKPHOS 64  ALBUMIN 4.4   Recent Labs    08/24/23 0000 12/16/23 0000  WBC 5.1 3.2  NEUTROABS 2,407.00 1,027.00  HGB 13.8 12.1  HCT 42 37  PLT 214 165   Lab Results  Component Value Date   TSH 2.98 01/04/2023   Lab Results  Component Value Date   HGBA1C 5.6 02/14/2020   Lab Results  Component Value Date   CHOL 202 (A) 08/24/2023   HDL 105 (A) 08/24/2023   LDLCALC 75 06/12/2022   LDLDIRECT 123.5 09/18/2013   TRIG 82 08/24/2023   CHOLHDL 2 07/02/2020    Significant  Diagnostic Results in last 30 days:  No results found.  Assessment/Plan  1. Paroxysmal atrial fibrillation ([name]) (Primary) -  rate-controlled - Continue apixaban  5 mg twice a day for anticoagulation -   Continue flecainide  100 mg twice a day for rhythm control  2. Age-related osteoporosis without current pathological fracture -T-score -3.0, osteoporotic range -  Continue alendronate 70 mg weekly  3. Neuropathy -   Continue gabapentin  100 mg twice a day  4. Acquired hypothyroidism -    TSH 1.43, 04/17/2024 -    Continue levothyroxine  25 mcg at bedtime  5. Orthostatic hypotension -   BP 130/84 -    Continue midodrine  5 mg 3 times a day  6. Mixed hyperlipidemia Lab Results  Component Value Date   CHOL 202 (A) 08/24/2023   HDL 105 (A) 08/24/2023   LDLCALC 75 06/12/2022   LDLDIRECT 123.5 09/18/2013   TRIG 82 08/24/2023   CHOLHDL 2 07/02/2020    -   Continue pravastatin  10 mg at bedtime  7. Hyponatremia -  sodium 135, 12/23/23 -   Continue sodium chloride  1 g half tab twice a day  8. Depression with anxiety -Continue Zoloft 25 mg daily - Continue Abilify 5 mg daily - Continue lorazepam  0.25 mg at bedtime    - Continue lorazepam  0.5 mg twice a day, morning and afternoon  9. Moderate cognitive impairment -  BIMS score 11/15, ranging as moderate cognitive impairment - Continue supportive care        Family/ staff Communication: Discussed plan of care with resident and charge nurse  Labs/tests ordered:  None     Aubert Choyce Medina-Vargas, DNP, MSN, FNP-BC [facility] [phone] (Monday-Friday 8:00 a.m. - 5:00 p.m.) [phone] (after hours)

## 2024-05-11 DIAGNOSIS — I509 Heart failure, unspecified: Secondary | ICD-10-CM | POA: Diagnosis not present

## 2024-05-11 LAB — CBC AND DIFFERENTIAL
HCT: 41 (ref 36–46)
Hemoglobin: 13.8 (ref 12.0–16.0)
Neutrophils Absolute: 1806
Platelets: 252 K/uL (ref 150–400)
WBC: 3.5

## 2024-05-11 LAB — COMPREHENSIVE METABOLIC PANEL WITH GFR: Calcium: 9.2 (ref 8.7–10.7)

## 2024-05-11 LAB — BASIC METABOLIC PANEL WITH GFR
BUN: 13 (ref 4–21)
CO2: 26 — AB (ref 13–22)
Chloride: 100 (ref 99–108)
Creatinine: 0.6 (ref 0.5–1.1)
Glucose: 112
Potassium: 4.1 meq/L (ref 3.5–5.1)
Sodium: 136 — AB (ref 137–147)

## 2024-05-11 LAB — CBC: RBC: 4.45 (ref 3.87–5.11)

## 2024-05-17 ENCOUNTER — Other Ambulatory Visit: Payer: Self-pay | Admitting: Nurse Practitioner

## 2024-05-17 DIAGNOSIS — F329 Major depressive disorder, single episode, unspecified: Secondary | ICD-10-CM | POA: Diagnosis not present

## 2024-05-17 DIAGNOSIS — F4320 Adjustment disorder, unspecified: Secondary | ICD-10-CM | POA: Diagnosis not present

## 2024-05-17 DIAGNOSIS — G309 Alzheimer's disease, unspecified: Secondary | ICD-10-CM | POA: Diagnosis not present

## 2024-05-17 DIAGNOSIS — F411 Generalized anxiety disorder: Secondary | ICD-10-CM | POA: Diagnosis not present

## 2024-05-17 MED ORDER — LORAZEPAM 0.5 MG PO TABS: ORAL_TABLET | ORAL | 5 refills | Status: DC

## 2024-06-09 ENCOUNTER — Non-Acute Institutional Stay (SKILLED_NURSING_FACILITY): Payer: Self-pay | Admitting: Orthopedic Surgery

## 2024-06-09 ENCOUNTER — Encounter: Payer: Self-pay | Admitting: Orthopedic Surgery

## 2024-06-09 DIAGNOSIS — G629 Polyneuropathy, unspecified: Secondary | ICD-10-CM

## 2024-06-09 DIAGNOSIS — E039 Hypothyroidism, unspecified: Secondary | ICD-10-CM

## 2024-06-09 DIAGNOSIS — F418 Other specified anxiety disorders: Secondary | ICD-10-CM

## 2024-06-09 DIAGNOSIS — M810 Age-related osteoporosis without current pathological fracture: Secondary | ICD-10-CM | POA: Diagnosis not present

## 2024-06-09 DIAGNOSIS — R4189 Other symptoms and signs involving cognitive functions and awareness: Secondary | ICD-10-CM | POA: Diagnosis not present

## 2024-06-09 DIAGNOSIS — E871 Hypo-osmolality and hyponatremia: Secondary | ICD-10-CM

## 2024-06-09 DIAGNOSIS — I480 Paroxysmal atrial fibrillation: Secondary | ICD-10-CM | POA: Diagnosis not present

## 2024-06-09 DIAGNOSIS — I951 Orthostatic hypotension: Secondary | ICD-10-CM

## 2024-06-09 NOTE — Progress Notes (Signed)
 Location:  Other [place].  Nursing Home Room Number: [facility] Place of Service:  SNF [id number]) Provider:  Greig Cluster, NP  PCP: Laurence Locus, DO  Patient Care Team: Laurence Locus, DO as PCP - General (Internal Medicine) Dingeldein, Elspeth, MD as Consulting Physician (Ophthalmology) Caro Harlene POUR, NP as Nurse Practitioner (Geriatric Medicine)  Extended Emergency Contact Information Primary Emergency Contact: [name],[name] Address: [address]          [place], [place] [address] [place] Mobile Phone: [phone] Relation: Relative Secondary Emergency Contact: [name] Address: [address] [place], [place] [address] [name] Phone: [phone] Relation: Other  Code Status:  Full Code Goals of care: Advanced Directive information    05/08/2024   12:45 PM  Advanced Directives  Does Patient Have a Medical Advance Directive? Yes  Type of Estate Agent of [place];Living will  Does patient want to make changes to medical advance directive? No - Patient declined  Copy of Healthcare Power of Attorney in Chart? Yes - validated most recent copy scanned in chart (See row information)     Chief Complaint  Patient presents with   Medical Management of Chronic Issues    Medical management of Chronic Issues.     HPI:  Pt is a 78 y.o. female seen today for medical management of chronic diseases.    She currently resides on the skilled nursing unit at [facility]. PMH: CVA, PAF, T2DM, HLD, hypothyroidism, h/o hysterectomy, h/o ORIF right distal radius 2016, osteoporosis, basal cell carcinoma 2014, ADHD, benzodiazepine dependence, memory loss and depression.   PAF- TSH 2.98 01/2023, no medication for rate control, remains on Eliquis  Cognitive impairment- MMSE 20/30 10/2020, CT head noted chronic microvascular ischemic changes of white matter, recent BIMS 8/15 05/12/2024, increased anxiety, no  agitation, remains on Abilify, Zoloft and ativan  Neuropathy- remains on gabapentin  Hypotension- remains on midodrine  Hypothyroidism- see above, remains on levothyroxine  Depression and anxiety- unsuccessful trial Lexapro , increased periods of anxiety, remains in Abilify, Zoloft and ativan  Hyponatremia- Na+ 136 05/11/2024, remains on sodium tablets Osteoporosis- 08/2021 DEXA, t score - 2.6, h/o ORIF right radius 2016, remains on Fosamax  Recent weights:  11/05- 162.6 lbs  10/07- 178 lbs  08/01- 169.14 lbs  Recent blood pressures:  11/07- 115/72  11/06- 122/77  11/05- 101/66  Past Medical History:  Diagnosis Date   Actinic keratosis 12/20/2012   left nasal tip   Anxiety    controlled with meds   Anxiety    Arrhythmia    Atrial fibrillation ([name])    paroxysmal, failed medical therapy with flecainide ,  NSVT with tikosyn   Basal cell carcinoma 12/20/2012   left proximal mandible   Bruises easily    CVA (cerebral vascular accident) ([name]) 12/2007   Depression    medically controlled    Glaucoma    also with macular holes AE Dr. Dingledein   History of loop recorder    Hypothyroidism    Low blood pressure    no falls, but if gets up to fast   RLS (restless legs syndrome)    Stroke ([name]) 7 years ago   Lasting effects on balance, different personality.   Thyroid  disease    Tremor    Ventricular tachycardia ([name])    pt told at [facility] that she had RVOT VT   Past Surgical History:  Procedure Laterality Date   ATRIAL FIBRILLATION ABLATION  10/18/12   PVI by Dr Kelsie   ATRIAL FIBRILLATION ABLATION N/A 10/18/2012   Procedure: ATRIAL FIBRILLATION ABLATION;  Surgeon: Lynwood Kelsie, MD;  Location: [facility] CATH LAB;  Service: Cardiovascular;  Laterality: N/A;   ATRIAL FIBRILLATION ABLATION N/A 04/18/2019   Procedure: ATRIAL FIBRILLATION ABLATION;  Surgeon: Kelsie Lynwood, MD;  Location: [facility] INVASIVE CV LAB;  Service: Cardiovascular;  Laterality: N/A;   EUS N/A 10/19/2019   Procedure: FULL  UPPER ENDOSCOPIC ULTRASOUND (EUS) RADIAL;  Surgeon: Queenie Asberry LABOR, MD;  Location: [facility] ENDOSCOPY;  Service: Gastroenterology;  Laterality: N/A;   EYE SURGERY     on L/R eye, macular hole    FRACTURE SURGERY     implantable loop recorder placement  07/13/2019   MDT Reveal LINQ1 [facility] [id number]) implanted in office by Dr Kelsie for afib management post ablation   OPEN REDUCTION INTERNAL FIXATION (ORIF) DISTAL RADIAL FRACTURE Right 11/15/2014   Procedure: OPEN REDUCTION INTERNAL FIXATION (ORIF) RIGHT DISTAL RADIAL FRACTURE WITH ALLOGRAFT BONE GRAFT;  Surgeon: Elsie Mussel, MD;  Location: [facility];  Service: Orthopedics;  Laterality: Right;   PANCREATECTOMY N/A 02/13/2020   Procedure: LAPAROSCOPIC DISTAL PANCREATECTOMY;  Surgeon: Aron Shoulders, MD;  Location: [facility] OR;  Service: General;  Laterality: N/A;   TEE WITHOUT CARDIOVERSION N/A 10/18/2012   Procedure: TRANSESOPHAGEAL ECHOCARDIOGRAM (TEE);  Surgeon: Redell GORMAN Shallow, MD;  Location: [facility] ENDOSCOPY;  Service: Cardiovascular;  Laterality: N/A;  Pre-Ablation at 12pm   TONSILLECTOMY     VAGINAL HYSTERECTOMY      Allergies  Allergen Reactions   Darvon Other (See Comments)    Severe panic attacks   Propoxyphene Other (See Comments)    GI Upset Severe panic attacks GI Upset   Propoxyphene N-Acetaminophen  Other (See Comments)    Severe panic attacks   Levofloxacin Anxiety and Other (See Comments)    Causes panic attacks.    Outpatient Encounter Medications as of 06/09/2024  Medication Sig   acetaminophen  (TYLENOL ) 500 MG tablet Take 1,000 mg by mouth 2 (two) times daily.   alendronate (FOSAMAX) 70 MG tablet Take 70 mg by mouth once a week. Take with a full glass of water  on an empty stomach.   apixaban  (ELIQUIS ) 5 MG TABS tablet Take 1 tablet (5 mg total) by mouth in the morning and at bedtime.   ARIPiprazole (ABILIFY) 5 MG tablet Take 5 mg by mouth at bedtime.   Cranberry 425 MG CAPS Take 2 capsules by mouth at  bedtime.   flecainide  (TAMBOCOR ) 100 MG tablet Take 100 mg by mouth 2 (two) times daily.   gabapentin  (NEURONTIN ) 100 MG capsule Take 100 mg by mouth 2 (two) times daily.   Levothyroxine  Sodium 25 MCG CAPS Take 1 capsule by mouth at bedtime.   LORazepam  (ATIVAN ) 0.5 MG tablet Give one tablet by mouth twice daily at 8 a and 4 p and Give 0.5 tablet by mouth at bedtime.   MAGNESIUM GLYCINATE PO Give 600mg  by mouth once daily for headaches.   melatonin 3 MG TABS tablet Take 3 mg by mouth at bedtime.   midodrine  (PROAMATINE ) 5 MG tablet Take 1 tablet (5 mg total) by mouth 3 (three) times daily with meals. Take it at 7am, 11am & 3 pm   multivitamin-iron-minerals-folic acid (THERAPEUTIC-M) TABS tablet Take 1 tablet by mouth daily.   pravastatin  (PRAVACHOL ) 10 MG tablet Take 1 tablet (10 mg total) by mouth at bedtime. Take a whole pill for pill pack and delivery   Riboflavin 100 MG TABS Take  4 tablets by mouth daily.   sertraline (ZOLOFT) 25 MG tablet Take 25 mg by mouth every morning.   sodium chloride  1 g tablet Take 0.5 tablets by mouth in the morning and at bedtime.   timolol  (TIMOPTIC ) 0.5 % ophthalmic solution Place 1 drop into the left eye at bedtime.   Wheat Dextrin (BENEFIBER PO) Take 1 Scoop by mouth daily.   escitalopram  (LEXAPRO ) 5 MG tablet Take 5 mg by mouth daily. (Patient not taking: Reported on 06/09/2024)   sertraline (ZOLOFT) 50 MG tablet Take 50 mg by mouth daily. (Patient not taking: Reported on 06/09/2024)   No facility-administered encounter medications on file as of 06/09/2024.    Review of Systems  Unable to perform ROS: Dementia    Immunization History  Administered Date(s) Administered   Fluad Quad(high Dose 65+) 05/10/2019, 05/22/2020   INFLUENZA, HIGH DOSE SEASONAL PF 05/24/2014   Influenza Split 05/21/2011, 05/31/2012   Influenza Whole 06/02/2010   Influenza,inj,Quad PF,6+ Mos 04/04/2015, 05/06/2016, 05/13/2017, 05/10/2018   Influenza-Unspecified 05/20/2022,  05/21/2023, 05/16/2024   Moderna Covid-19 Fall Seasonal Vaccine 87yrs & older 11/10/2022   Moderna SARS-COV2 Booster Vaccination 12/09/2020   Moderna Sars-Covid-2 Vaccination 08/17/2019, 09/14/2019, 06/18/2020, 04/24/2021, 12/30/2021   Pneumococcal Conjugate-13 10/02/2013   Pneumococcal Polysaccharide-23 04/04/2015   RSV,unspecified 05/09/2024   Td 04/18/2008   Tdap 10/30/2015   Unspecified SARS-COV-2 Vaccination 06/12/2022, 04/30/2023, 11/12/2023   Zoster Recombinant(Shingrix) 01/27/2018, 03/13/2019   Zoster, Live 04/18/2008   Pertinent  Health Maintenance Due  Topic Date Due   FOOT EXAM  Never done   HEMOGLOBIN A1C  08/16/2020   OPHTHALMOLOGY EXAM  01/16/2025   Influenza Vaccine  Completed   DEXA SCAN  Completed   Mammogram  Discontinued   Colonoscopy  Discontinued      12/26/2020   11:44 AM 02/20/2021    1:17 PM 10/18/2021    4:29 PM 12/30/2022   10:40 AM 01/27/2024    2:10 PM  Fall Risk  Falls in the past year? 0 0  1 0  Was there an injury with Fall? 0 0  0   Fall Risk Category Calculator 0 0  2   Fall Risk Category (Retired) Low  Low      (RETIRED) Patient Fall Risk Level Low fall risk  Low fall risk  Low fall risk     Patient at Risk for Falls Due to    History of fall(s);Impaired balance/gait   Fall risk Follow up Falls evaluation completed  Falls evaluation completed   Education provided      Data saved with a previous flowsheet row definition   Functional Status Survey:    Vitals:   06/09/24 1304  BP: 115/72  Pulse: 89  Resp: 16  Temp: 97.6 F (36.4 C)  SpO2: 93%  Weight: 162 lb 9.6 oz (73.8 kg)  Height: 5' 9 (1.753 m)   Body mass index is 24.01 kg/m. Physical Exam Vitals reviewed.  Constitutional:      General: She is not in acute distress. HENT:     Head: Normocephalic.  Eyes:     General:        Right eye: No discharge.        Left eye: No discharge.  Cardiovascular:     Rate and Rhythm: Normal rate and regular rhythm.     Pulses: Normal  pulses.     Heart sounds: Normal heart sounds.  Pulmonary:     Effort: Pulmonary effort is normal. No respiratory distress.  Breath sounds: Normal breath sounds. No wheezing or rales.  Abdominal:     General: Bowel sounds are normal. There is no distension.     Palpations: Abdomen is soft.     Tenderness: There is no abdominal tenderness.  Musculoskeletal:     Cervical back: Neck supple.     Right lower leg: No edema.     Left lower leg: No edema.  Skin:    General: Skin is warm.     Capillary Refill: Capillary refill takes less than 2 seconds.  Neurological:     General: No focal deficit present.     Mental Status: She is alert. Mental status is at baseline.     Gait: Gait abnormal.  Psychiatric:        Mood and Affect: Mood normal.     Comments: Very pleasant, alert to self/familiar face follows commands     Labs reviewed: Recent Labs    12/16/23 0000 12/23/23 0000 05/11/24 0000  NA 131* 135* 136*  K 4.4 4.7 4.1  CL 97* 100 100  CO2 26* 26* 26*  BUN 18 20 13   CREATININE 0.6 0.6 0.6  CALCIUM 8.4* 8.5* 9.2   Recent Labs    08/24/23 0000  AST 17  ALT 13  ALKPHOS 64  ALBUMIN 4.4   Recent Labs    08/24/23 0000 12/16/23 0000 05/11/24 0000  WBC 5.1 3.2 3.5  NEUTROABS 2,407.00 1,027.00 1,806.00  HGB 13.8 12.1 13.8  HCT 42 37 41  PLT 214 165 252   Lab Results  Component Value Date   TSH 2.98 01/04/2023   Lab Results  Component Value Date   HGBA1C 5.6 02/14/2020   Lab Results  Component Value Date   CHOL 202 (A) 08/24/2023   HDL 105 (A) 08/24/2023   LDLCALC 75 06/12/2022   LDLDIRECT 123.5 09/18/2013   TRIG 82 08/24/2023   CHOLHDL 2 07/02/2020    Significant Diagnostic Results in last 30 days:  No results found.  Assessment/Plan: 1. Paroxysmal atrial fibrillation ([name]) (Primary) - HR< 100 without medication - cont Eliquis  for clot prevention  2. Moderate cognitive impairment - recent BIMS 8/15 - recently admitted  - dependent with some  ADLs - weight stable - periods of anxiety> worse since admission - cont abilify, zoloft and ativan   3. Neuropathy - cont gabapentin   4. Orthostatic hypotension - cont midodrine   5. Acquired hypothyroidism - TSH stable - cont levothyroxine   6. Depression with anxiety - increased anxiety  - will increase Zoloft to 50 mg> consider reducing ativan  dose next month if increased zoloft effective - unsuccessful trial Lexapro  - cont Abfilify and ativan   7. Hyponatremia - on SSRI - cont sodium tablet  8. Age-related osteoporosis without current pathological fracture - 08/2021 DEXA with t score- 2.5 - cont fosamax   Family/ staff Communication: plan discussed with patient and nurse  Labs/tests ordered:  none

## 2024-06-13 DIAGNOSIS — R413 Other amnesia: Secondary | ICD-10-CM | POA: Diagnosis not present

## 2024-06-13 DIAGNOSIS — Z8673 Personal history of transient ischemic attack (TIA), and cerebral infarction without residual deficits: Secondary | ICD-10-CM | POA: Diagnosis not present

## 2024-06-13 DIAGNOSIS — F411 Generalized anxiety disorder: Secondary | ICD-10-CM | POA: Diagnosis not present

## 2024-06-13 DIAGNOSIS — R2689 Other abnormalities of gait and mobility: Secondary | ICD-10-CM | POA: Diagnosis not present

## 2024-06-13 DIAGNOSIS — Z1331 Encounter for screening for depression: Secondary | ICD-10-CM | POA: Diagnosis not present

## 2024-06-13 DIAGNOSIS — F339 Major depressive disorder, recurrent, unspecified: Secondary | ICD-10-CM | POA: Diagnosis not present

## 2024-06-15 DIAGNOSIS — F329 Major depressive disorder, single episode, unspecified: Secondary | ICD-10-CM | POA: Diagnosis not present

## 2024-06-15 DIAGNOSIS — F02C3 Dementia in other diseases classified elsewhere, severe, with mood disturbance: Secondary | ICD-10-CM | POA: Diagnosis not present

## 2024-06-15 DIAGNOSIS — F4320 Adjustment disorder, unspecified: Secondary | ICD-10-CM | POA: Diagnosis not present

## 2024-06-15 DIAGNOSIS — G309 Alzheimer's disease, unspecified: Secondary | ICD-10-CM | POA: Diagnosis not present

## 2024-06-16 ENCOUNTER — Other Ambulatory Visit: Payer: Self-pay | Admitting: Physician Assistant

## 2024-06-16 DIAGNOSIS — Z8673 Personal history of transient ischemic attack (TIA), and cerebral infarction without residual deficits: Secondary | ICD-10-CM

## 2024-06-16 DIAGNOSIS — R2689 Other abnormalities of gait and mobility: Secondary | ICD-10-CM

## 2024-06-16 DIAGNOSIS — R413 Other amnesia: Secondary | ICD-10-CM

## 2024-06-19 ENCOUNTER — Other Ambulatory Visit: Payer: Self-pay | Admitting: Orthopedic Surgery

## 2024-06-19 MED ORDER — LORAZEPAM 0.5 MG PO TABS: ORAL_TABLET | ORAL | 5 refills | Status: AC

## 2024-06-20 ENCOUNTER — Non-Acute Institutional Stay (SKILLED_NURSING_FACILITY): Payer: Self-pay | Admitting: Nurse Practitioner

## 2024-06-20 ENCOUNTER — Encounter: Payer: Self-pay | Admitting: Nurse Practitioner

## 2024-06-20 DIAGNOSIS — T148XXA Other injury of unspecified body region, initial encounter: Secondary | ICD-10-CM | POA: Diagnosis not present

## 2024-06-20 DIAGNOSIS — L03032 Cellulitis of left toe: Secondary | ICD-10-CM | POA: Diagnosis not present

## 2024-06-20 DIAGNOSIS — M79675 Pain in left toe(s): Secondary | ICD-10-CM | POA: Diagnosis not present

## 2024-06-20 NOTE — Progress Notes (Signed)
 Location:  Other [facility]) Nursing Home Room Number: 506 A Place of Service:  SNF (31)  Dasan Hardman K. Caro, NP   Patient Care Team: Laurence Locus, DO as PCP - General (Internal Medicine) Dingeldein, Elspeth, MD as Consulting Physician (Ophthalmology) Caro Harlene POUR, NP as Nurse Practitioner (Geriatric Medicine)  Extended Emergency Contact Information Primary Emergency Contact: [name],[name] Address: [address]          [place], [place] [address] [place] Mobile Phone: [phone] Relation: Relative Secondary Emergency Contact: [name] Address: [address] [place], [place] [address] [name] Phone: [phone] Relation: Other  Goals of care: Advanced Directive information    05/08/2024   12:45 PM  Advanced Directives  Does Patient Have a Medical Advance Directive? Yes  Type of Estate Agent of [place];Living will  Does patient want to make changes to medical advance directive? No - Patient declined  Copy of Healthcare Power of Attorney in Chart? Yes - validated most recent copy scanned in chart (See row information)     Chief Complaint  Patient presents with   Foot Swelling    Patient with toe swelling and redness     HPI:  Pt is a 78 y.o. female seen today for an acute visit for redness and swelling of left foot.  Staff noted DTI on left toe last week and has been monitoring. Over the last few days toe has become more painful, red and swollen.  No fever or chills noted She is not able to wear a shoe due to pain.  Small open area with drainage noted.    Past Medical History:  Diagnosis Date   Actinic keratosis 12/20/2012   left nasal tip   Anxiety    controlled with meds   Anxiety    Arrhythmia    Atrial fibrillation ([name])    paroxysmal, failed medical therapy with flecainide ,  NSVT with tikosyn   Basal cell carcinoma 12/20/2012   left proximal mandible   Bruises easily     CVA (cerebral vascular accident) ([name]) 12/2007   Depression    medically controlled    Glaucoma    also with macular holes AE Dr. Dingledein   History of loop recorder    Hypothyroidism    Low blood pressure    no falls, but if gets up to fast   RLS (restless legs syndrome)    Stroke ([name]) 7 years ago   Lasting effects on balance, different personality.   Thyroid  disease    Tremor    Ventricular tachycardia ([name])    pt told at [facility] that she had RVOT VT   Past Surgical History:  Procedure Laterality Date   ATRIAL FIBRILLATION ABLATION  10/18/12   PVI by Dr Kelsie   ATRIAL FIBRILLATION ABLATION N/A 10/18/2012   Procedure: ATRIAL FIBRILLATION ABLATION;  Surgeon: Lynwood Kelsie, MD;  Location: [facility] CATH LAB;  Service: Cardiovascular;  Laterality: N/A;   ATRIAL FIBRILLATION ABLATION N/A 04/18/2019   Procedure: ATRIAL FIBRILLATION ABLATION;  Surgeon: Kelsie Lynwood, MD;  Location: [facility] INVASIVE CV LAB;  Service: Cardiovascular;  Laterality: N/A;   EUS N/A 10/19/2019   Procedure: FULL UPPER ENDOSCOPIC ULTRASOUND (EUS) RADIAL;  Surgeon: Queenie Asberry LABOR, MD;  Location: [facility] ENDOSCOPY;  Service: Gastroenterology;  Laterality: N/A;   EYE SURGERY     on L/R eye, macular hole    FRACTURE SURGERY     implantable loop  recorder placement  07/13/2019   MDT Reveal LINQ1 [facility] [id number]) implanted in office by Dr Kelsie for afib management post ablation   OPEN REDUCTION INTERNAL FIXATION (ORIF) DISTAL RADIAL FRACTURE Right 11/15/2014   Procedure: OPEN REDUCTION INTERNAL FIXATION (ORIF) RIGHT DISTAL RADIAL FRACTURE WITH ALLOGRAFT BONE GRAFT;  Surgeon: Elsie Mussel, MD;  Location: [facility];  Service: Orthopedics;  Laterality: Right;   PANCREATECTOMY N/A 02/13/2020   Procedure: LAPAROSCOPIC DISTAL PANCREATECTOMY;  Surgeon: Aron Shoulders, MD;  Location: [facility] OR;  Service: General;  Laterality: N/A;   TEE WITHOUT CARDIOVERSION N/A 10/18/2012   Procedure: TRANSESOPHAGEAL ECHOCARDIOGRAM (TEE);   Surgeon: Redell GORMAN Shallow, MD;  Location: [facility] ENDOSCOPY;  Service: Cardiovascular;  Laterality: N/A;  Pre-Ablation at 12pm   TONSILLECTOMY     VAGINAL HYSTERECTOMY      Allergies  Allergen Reactions   Darvon Other (See Comments)    Severe panic attacks   Propoxyphene Other (See Comments)    GI Upset Severe panic attacks GI Upset   Propoxyphene N-Acetaminophen  Other (See Comments)    Severe panic attacks   Levofloxacin Anxiety and Other (See Comments)    Causes panic attacks.    Outpatient Encounter Medications as of 06/20/2024  Medication Sig   acetaminophen  (TYLENOL ) 500 MG tablet Take 1,000 mg by mouth 2 (two) times daily.   alendronate (FOSAMAX) 70 MG tablet Take 70 mg by mouth once a week. Take with a full glass of water  on an empty stomach.   apixaban  (ELIQUIS ) 5 MG TABS tablet Take 1 tablet (5 mg total) by mouth in the morning and at bedtime.   ARIPiprazole (ABILIFY) 5 MG tablet Take 5 mg by mouth at bedtime.   Cranberry 425 MG CAPS Take 1 capsule by mouth at bedtime.   donepezil (ARICEPT) 5 MG tablet Take 5 mg by mouth at bedtime.   flecainide  (TAMBOCOR ) 100 MG tablet Take 100 mg by mouth 2 (two) times daily.   gabapentin  (NEURONTIN ) 100 MG capsule Take 100 mg by mouth 2 (two) times daily.   Levothyroxine  Sodium 25 MCG CAPS Take 1 capsule by mouth at bedtime.   LORazepam  (ATIVAN ) 0.5 MG tablet Give one tablet by mouth twice daily at 8 a and 4 p and Give 0.5 tablet by mouth at bedtime.   MAGNESIUM GLYCINATE PO Give 600mg  by mouth once daily for headaches.   melatonin 3 MG TABS tablet Take 3 mg by mouth at bedtime.   midodrine  (PROAMATINE ) 5 MG tablet Take 1 tablet (5 mg total) by mouth 3 (three) times daily with meals. Take it at 7am, 11am & 3 pm   multivitamin-iron-minerals-folic acid (THERAPEUTIC-M) TABS tablet Take 1 tablet by mouth daily.   pravastatin  (PRAVACHOL ) 10 MG tablet Take 1 tablet (10 mg total) by mouth at bedtime. Take a whole pill for pill pack and delivery    Riboflavin 100 MG TABS Take 4 tablets by mouth daily.   sertraline (ZOLOFT) 50 MG tablet Take 50 mg by mouth every morning.   sodium chloride  1 g tablet Take 0.5 tablets by mouth in the morning and at bedtime.   timolol  (TIMOPTIC ) 0.5 % ophthalmic solution Place 1 drop into the left eye at bedtime.   UNABLE TO FIND Take 1 Dose by mouth 2 (two) times daily. Med Name: Medpass   Wheat Dextrin (BENEFIBER PO) Take 1 Scoop by mouth daily.   No facility-administered encounter medications on file as of 06/20/2024.    Review of Systems  Musculoskeletal:  Positive for joint  swelling.  Skin:  Positive for color change and wound. Negative for rash.    Immunization History  Administered Date(s) Administered   Fluad Quad(high Dose 65+) 05/10/2019, 05/22/2020   INFLUENZA, HIGH DOSE SEASONAL PF 05/24/2014   Influenza Split 05/21/2011, 05/31/2012   Influenza Whole 06/02/2010   Influenza,inj,Quad PF,6+ Mos 04/04/2015, 05/06/2016, 05/13/2017, 05/10/2018   Influenza-Unspecified 05/20/2022, 05/21/2023, 05/16/2024   Moderna Covid-19 Fall Seasonal Vaccine 27yrs & older 11/10/2022   Moderna SARS-COV2 Booster Vaccination 12/09/2020   Moderna Sars-Covid-2 Vaccination 08/17/2019, 09/14/2019, 06/18/2020, 04/24/2021, 12/30/2021   Pneumococcal Conjugate-13 10/02/2013   Pneumococcal Polysaccharide-23 04/04/2015   RSV,unspecified 05/09/2024   Td 04/18/2008   Tdap 10/30/2015   Unspecified SARS-COV-2 Vaccination 06/12/2022, 04/30/2023, 11/12/2023   Zoster Recombinant(Shingrix) 01/27/2018, 03/13/2019   Zoster, Live 04/18/2008   Pertinent  Health Maintenance Due  Topic Date Due   FOOT EXAM  Never done   HEMOGLOBIN A1C  08/16/2020   OPHTHALMOLOGY EXAM  01/16/2025   Influenza Vaccine  Completed   DEXA SCAN  Completed   Mammogram  Discontinued   Colonoscopy  Discontinued      02/20/2021    1:17 PM 10/18/2021    4:29 PM 12/30/2022   10:40 AM 01/27/2024    2:10 PM 06/11/2024    2:36 PM  Fall Risk  Falls in  the past year? 0  1 0 1  Was there an injury with Fall? 0  0  0  Fall Risk Category Calculator 0  2  1  Fall Risk Category (Retired) Low       (RETIRED) Patient Fall Risk Level Low fall risk  Low fall risk      Patient at Risk for Falls Due to   History of fall(s);Impaired balance/gait  History of fall(s)  Fall risk Follow up Falls evaluation completed   Education provided  Falls evaluation completed     Data saved with a previous flowsheet row definition   Functional Status Survey:    Vitals:   06/20/24 0836  BP: 127/77  Pulse: 93  Temp: (!) 97.5 F (36.4 C)  SpO2: 94%  Weight: 162 lb 9.6 oz (73.8 kg)  Height: 5' 9 (1.753 m)   Body mass index is 24.01 kg/m. Physical Exam Constitutional:      Appearance: Normal appearance.  Cardiovascular:     Pulses:          Dorsalis pedis pulses are 2+ on the left side.  Pulmonary:     Effort: Pulmonary effort is normal.  Feet:     Left foot:     Skin integrity: Ulcer, skin breakdown, erythema and warmth present.  Neurological:     Mental Status: She is alert. Mental status is at baseline.  Psychiatric:        Mood and Affect: Mood normal.        Labs reviewed: Recent Labs    12/16/23 0000 12/23/23 0000 05/11/24 0000  NA 131* 135* 136*  K 4.4 4.7 4.1  CL 97* 100 100  CO2 26* 26* 26*  BUN 18 20 13   CREATININE 0.6 0.6 0.6  CALCIUM 8.4* 8.5* 9.2   Recent Labs    08/24/23 0000  AST 17  ALT 13  ALKPHOS 64  ALBUMIN 4.4   Recent Labs    08/24/23 0000 12/16/23 0000 05/11/24 0000  WBC 5.1 3.2 3.5  NEUTROABS 2,407.00 1,027.00 1,806.00  HGB 13.8 12.1 13.8  HCT 42 37 41  PLT 214 165 252   Lab Results  Component Value  Date   TSH 2.98 01/04/2023   Lab Results  Component Value Date   HGBA1C 5.6 02/14/2020   Lab Results  Component Value Date   CHOL 202 (A) 08/24/2023   HDL 105 (A) 08/24/2023   LDLCALC 75 06/12/2022   LDLDIRECT 123.5 09/18/2013   TRIG 82 08/24/2023   CHOLHDL 2 07/02/2020     Significant Diagnostic Results in last 30 days:  No results found.  Assessment/Plan 1. Deep tissue injury (Primary) Continue off loading- no pressure to toe Only wearing nonslip socks Now with open area-draining- to use calcium alginate dressing twice daily Has wound care consult and will be seeing in facility tomorrow.   2. Cellulitis of toe of left foot Will start doxycycline  100 mg PO BID for 10 days Xray of foot to rule out osteomyelitis Cbc with diff   Lexie Morini K. Caro BODILY [facility] [phone]

## 2024-06-21 DIAGNOSIS — G309 Alzheimer's disease, unspecified: Secondary | ICD-10-CM | POA: Diagnosis not present

## 2024-06-21 DIAGNOSIS — E11621 Type 2 diabetes mellitus with foot ulcer: Secondary | ICD-10-CM | POA: Diagnosis not present

## 2024-06-21 DIAGNOSIS — L97522 Non-pressure chronic ulcer of other part of left foot with fat layer exposed: Secondary | ICD-10-CM | POA: Diagnosis not present

## 2024-06-21 DIAGNOSIS — M6281 Muscle weakness (generalized): Secondary | ICD-10-CM | POA: Diagnosis not present

## 2024-06-22 DIAGNOSIS — G309 Alzheimer's disease, unspecified: Secondary | ICD-10-CM | POA: Diagnosis not present

## 2024-06-22 DIAGNOSIS — E0849 Diabetes mellitus due to underlying condition with other diabetic neurological complication: Secondary | ICD-10-CM | POA: Diagnosis not present

## 2024-06-22 DIAGNOSIS — F0283 Dementia in other diseases classified elsewhere, unspecified severity, with mood disturbance: Secondary | ICD-10-CM | POA: Diagnosis not present

## 2024-06-22 LAB — CBC AND DIFFERENTIAL
HCT: 37 (ref 36–46)
Hemoglobin: 12 (ref 12.0–16.0)
Neutrophils Absolute: 2323
Platelets: 227 K/uL (ref 150–400)
WBC: 4.4

## 2024-06-22 LAB — CBC: RBC: 3.95 (ref 3.87–5.11)

## 2024-06-28 DIAGNOSIS — G309 Alzheimer's disease, unspecified: Secondary | ICD-10-CM | POA: Diagnosis not present

## 2024-06-28 DIAGNOSIS — L97522 Non-pressure chronic ulcer of other part of left foot with fat layer exposed: Secondary | ICD-10-CM | POA: Diagnosis not present

## 2024-06-28 DIAGNOSIS — E11621 Type 2 diabetes mellitus with foot ulcer: Secondary | ICD-10-CM | POA: Diagnosis not present

## 2024-06-28 DIAGNOSIS — M6281 Muscle weakness (generalized): Secondary | ICD-10-CM | POA: Diagnosis not present

## 2024-07-05 DIAGNOSIS — L97522 Non-pressure chronic ulcer of other part of left foot with fat layer exposed: Secondary | ICD-10-CM | POA: Diagnosis not present

## 2024-07-05 DIAGNOSIS — R0989 Other specified symptoms and signs involving the circulatory and respiratory systems: Secondary | ICD-10-CM | POA: Diagnosis not present

## 2024-07-05 DIAGNOSIS — M6281 Muscle weakness (generalized): Secondary | ICD-10-CM | POA: Diagnosis not present

## 2024-07-05 DIAGNOSIS — E11621 Type 2 diabetes mellitus with foot ulcer: Secondary | ICD-10-CM | POA: Diagnosis not present

## 2024-07-05 DIAGNOSIS — G309 Alzheimer's disease, unspecified: Secondary | ICD-10-CM | POA: Diagnosis not present

## 2024-07-07 ENCOUNTER — Encounter: Payer: Self-pay | Admitting: Orthopedic Surgery

## 2024-07-07 NOTE — Progress Notes (Signed)
 This encounter was created in error - please disregard.

## 2024-07-07 NOTE — Progress Notes (Deleted)
 SABRA

## 2024-07-10 ENCOUNTER — Encounter: Payer: Self-pay | Admitting: Orthopedic Surgery

## 2024-07-10 ENCOUNTER — Non-Acute Institutional Stay (SKILLED_NURSING_FACILITY): Payer: Self-pay | Admitting: Orthopedic Surgery

## 2024-07-10 DIAGNOSIS — E039 Hypothyroidism, unspecified: Secondary | ICD-10-CM | POA: Diagnosis not present

## 2024-07-10 DIAGNOSIS — R600 Localized edema: Secondary | ICD-10-CM | POA: Diagnosis not present

## 2024-07-10 DIAGNOSIS — I951 Orthostatic hypotension: Secondary | ICD-10-CM | POA: Diagnosis not present

## 2024-07-10 DIAGNOSIS — M810 Age-related osteoporosis without current pathological fracture: Secondary | ICD-10-CM | POA: Diagnosis not present

## 2024-07-10 DIAGNOSIS — E871 Hypo-osmolality and hyponatremia: Secondary | ICD-10-CM | POA: Diagnosis not present

## 2024-07-10 DIAGNOSIS — G629 Polyneuropathy, unspecified: Secondary | ICD-10-CM | POA: Diagnosis not present

## 2024-07-10 DIAGNOSIS — L03032 Cellulitis of left toe: Secondary | ICD-10-CM

## 2024-07-10 DIAGNOSIS — F331 Major depressive disorder, recurrent, moderate: Secondary | ICD-10-CM | POA: Diagnosis not present

## 2024-07-10 DIAGNOSIS — R634 Abnormal weight loss: Secondary | ICD-10-CM | POA: Diagnosis not present

## 2024-07-10 DIAGNOSIS — I480 Paroxysmal atrial fibrillation: Secondary | ICD-10-CM

## 2024-07-10 DIAGNOSIS — R4189 Other symptoms and signs involving cognitive functions and awareness: Secondary | ICD-10-CM

## 2024-07-10 NOTE — Progress Notes (Unsigned)
 Location:  Other Nursing Home Room Number: [facility] 506/A Place of Service:  SNF [phone]) Provider:  Greig FORBES Cluster, NP   Laurence Locus, DO  Patient Care Team: Laurence Locus, DO as PCP - General (Internal Medicine) Dingeldein, Elspeth, MD as Consulting Physician (Ophthalmology) Caro Harlene POUR, NP as Nurse Practitioner (Geriatric Medicine)  Extended Emergency Contact Information Primary Emergency Contact: [name],[name] Address: [address]          [place], [place] [address] [place] Mobile Phone: [phone] Relation: Relative Secondary Emergency Contact: [name] Address: [address], [place] [address] [name] Phone: [phone] Relation: Other  Code Status:  Full code Goals of care: Advanced Directive information    05/08/2024   12:45 PM  Advanced Directives  Does Patient Have a Medical Advance Directive? Yes  Type of Estate Agent of [place];Living will  Does patient want to make changes to medical advance directive? No - Patient declined  Copy of Healthcare Power of Attorney in Chart? Yes - validated most recent copy scanned in chart (See row information)     Chief Complaint  Patient presents with   Medical Management of Chronic Issues    HPI:  Pt is a 78 y.o. female seen today for medical management of chronic conditions.   She currently resides on the skilled nursing unit at [facility]. PMH: CVA, PAF, T2DM, HLD, hypothyroidism, h/o hysterectomy, h/o ORIF right distal radius 2016, osteoporosis, basal cell carcinoma 2014, ADHD, benzodiazepine dependence, memory loss and depression.   Weight loss- see trends below, remains on Medpass  Bilateral lower leg edema- noted 06/2024, normal ABI 07/05/2024 Left great toe wound/cellulitis- followed by wound care, 11/26 started on doxycycline  x 14 days, WBC was 4.4 06/22/2024, xray left foot negative for osteomyelitis> soft tissue  swelling 06/20/2024, remains on Prosource Plus PAF- TSH 2.98 01/2023, no medication for rate control, remains on Eliquis  Cognitive impairment- MMSE 20/30 10/2020, CT head noted chronic microvascular ischemic changes of white matter, recent BIMS 8/15 05/12/2024, increased anxiety, no agitation, remains on Abilify, Zoloft and ativan  Neuropathy- remains on gabapentin  Hypotension- remains on midodrine  Hypothyroidism- see above, remains on levothyroxine  Depression and anxiety- unsuccessful trial Lexapro , increased periods of anxiety, remains in Abilify, Zoloft and ativan  Hyponatremia- Na+ 136 05/11/2024, remains on sodium tablets Osteoporosis- 08/2021 DEXA, t score - 2.6, h/o ORIF right radius 2016, remains on Fosamax  Recent weights:  12/03- 163.2 lbs  11/05- 162.6 lbs  10/06- 178 lbs   Past Medical History:  Diagnosis Date   Actinic keratosis 12/20/2012   left nasal tip   Anxiety    controlled with meds   Anxiety    Arrhythmia    Atrial fibrillation ([name])    paroxysmal, failed medical therapy with flecainide ,  NSVT with tikosyn   Basal cell carcinoma 12/20/2012   left proximal mandible   Bruises easily    CVA (cerebral vascular accident) ([name]) 12/2007   Depression    medically controlled    Glaucoma    also with macular holes AE Dr. Dingledein   History of loop recorder    Hypothyroidism    Low blood pressure    no falls, but if gets up to fast   RLS (restless legs syndrome)    Stroke ([name]) 7 years ago   Lasting effects on balance, different personality.   Thyroid  disease    Tremor    Ventricular tachycardia (  [name])    pt told at [facility] that she had RVOT VT   Past Surgical History:  Procedure Laterality Date   ATRIAL FIBRILLATION ABLATION  10/18/12   PVI by Dr Kelsie   ATRIAL FIBRILLATION ABLATION N/A 10/18/2012   Procedure: ATRIAL FIBRILLATION ABLATION;  Surgeon: Lynwood Kelsie, MD;  Location: [facility] CATH LAB;  Service: Cardiovascular;  Laterality: N/A;   ATRIAL FIBRILLATION  ABLATION N/A 04/18/2019   Procedure: ATRIAL FIBRILLATION ABLATION;  Surgeon: Kelsie Lynwood, MD;  Location: [facility] INVASIVE CV LAB;  Service: Cardiovascular;  Laterality: N/A;   EUS N/A 10/19/2019   Procedure: FULL UPPER ENDOSCOPIC ULTRASOUND (EUS) RADIAL;  Surgeon: Queenie Asberry LABOR, MD;  Location: [facility] ENDOSCOPY;  Service: Gastroenterology;  Laterality: N/A;   EYE SURGERY     on L/R eye, macular hole    FRACTURE SURGERY     implantable loop recorder placement  07/13/2019   MDT Reveal LINQ1 [facility] [id number]) implanted in office by Dr Kelsie for afib management post ablation   OPEN REDUCTION INTERNAL FIXATION (ORIF) DISTAL RADIAL FRACTURE Right 11/15/2014   Procedure: OPEN REDUCTION INTERNAL FIXATION (ORIF) RIGHT DISTAL RADIAL FRACTURE WITH ALLOGRAFT BONE GRAFT;  Surgeon: Elsie Mussel, MD;  Location: [facility];  Service: Orthopedics;  Laterality: Right;   PANCREATECTOMY N/A 02/13/2020   Procedure: LAPAROSCOPIC DISTAL PANCREATECTOMY;  Surgeon: Aron Shoulders, MD;  Location: [facility] OR;  Service: General;  Laterality: N/A;   TEE WITHOUT CARDIOVERSION N/A 10/18/2012   Procedure: TRANSESOPHAGEAL ECHOCARDIOGRAM (TEE);  Surgeon: Redell GORMAN Shallow, MD;  Location: [facility] ENDOSCOPY;  Service: Cardiovascular;  Laterality: N/A;  Pre-Ablation at 12pm   TONSILLECTOMY     VAGINAL HYSTERECTOMY      Allergies  Allergen Reactions   Darvon Other (See Comments)    Severe panic attacks   Propoxyphene Other (See Comments)    GI Upset Severe panic attacks GI Upset   Propoxyphene N-Acetaminophen  Other (See Comments)    Severe panic attacks   Levofloxacin Anxiety and Other (See Comments)    Causes panic attacks.    Outpatient Encounter Medications as of 07/10/2024  Medication Sig   acetaminophen  (TYLENOL ) 500 MG tablet Take 1,000 mg by mouth 3 (three) times daily.   alendronate (FOSAMAX) 70 MG tablet Take 70 mg by mouth once a week. Take with a full glass of water  on an empty stomach.   apixaban  (ELIQUIS )  5 MG TABS tablet Take 1 tablet (5 mg total) by mouth in the morning and at bedtime.   ARIPiprazole (ABILIFY) 5 MG tablet Take 5 mg by mouth at bedtime.   COLLAGEN-ANTIMICROBIAL EX Apply 1 Application topically daily.   Cranberry 425 MG CAPS Take 1 capsule by mouth at bedtime.   donepezil (ARICEPT) 5 MG tablet Take 5 mg by mouth at bedtime.   flecainide  (TAMBOCOR ) 100 MG tablet Take 100 mg by mouth 2 (two) times daily.   gabapentin  (NEURONTIN ) 100 MG capsule Take 100 mg by mouth 2 (two) times daily.   Levothyroxine  Sodium 25 MCG CAPS Take 1 capsule by mouth at bedtime.   LORazepam  (ATIVAN ) 0.5 MG tablet Give one tablet by mouth twice daily at 8 a and 4 p and Give 0.5 tablet by mouth at bedtime.   MAGNESIUM GLYCINATE PO Give 600mg  by mouth once daily for headaches.   melatonin 3 MG TABS tablet Take 3 mg by mouth at bedtime.   midodrine  (PROAMATINE ) 5 MG tablet Take 1 tablet (5 mg total) by mouth 3 (three) times daily with meals. Take it at  7am, 11am & 3 pm   multivitamin-iron-minerals-folic acid (THERAPEUTIC-M) TABS tablet Take 1 tablet by mouth daily.   mupirocin ointment (BACTROBAN) 2 % 1 Application 2 (two) times daily.   pravastatin  (PRAVACHOL ) 10 MG tablet Take 1 tablet (10 mg total) by mouth at bedtime. Take a whole pill for pill pack and delivery   Riboflavin 100 MG TABS Take 4 tablets by mouth daily.   sertraline (ZOLOFT) 50 MG tablet Take 50 mg by mouth every morning.   sodium chloride  1 g tablet Take 0.5 tablets by mouth in the morning and at bedtime.   timolol  (TIMOPTIC ) 0.5 % ophthalmic solution Place 1 drop into the left eye at bedtime.   UNABLE TO FIND Take 1 Dose by mouth 2 (two) times daily. Med Name: Medpass   Wheat Dextrin (BENEFIBER PO) Take 1 Scoop by mouth daily.   No facility-administered encounter medications on file as of 07/10/2024.    Review of Systems  Unable to perform ROS: Dementia    Immunization History  Administered Date(s) Administered   Fluad Quad(high  Dose 65+) 05/10/2019, 05/22/2020   INFLUENZA, HIGH DOSE SEASONAL PF 05/24/2014   Influenza Split 05/21/2011, 05/31/2012   Influenza Whole 06/02/2010   Influenza,inj,Quad PF,6+ Mos 04/04/2015, 05/06/2016, 05/13/2017, 05/10/2018   Influenza-Unspecified 05/20/2022, 05/21/2023, 05/16/2024   Moderna Covid-19 Fall Seasonal Vaccine 33yrs & older 11/10/2022   Moderna SARS-COV2 Booster Vaccination 12/09/2020   Moderna Sars-Covid-2 Vaccination 08/17/2019, 09/14/2019, 06/18/2020, 04/24/2021, 12/30/2021   Pneumococcal Conjugate-13 10/02/2013   Pneumococcal Polysaccharide-23 04/04/2015   RSV,unspecified 05/09/2024   Td 04/18/2008   Tdap 10/30/2015   Unspecified SARS-COV-2 Vaccination 06/12/2022, 04/30/2023, 11/12/2023   Zoster Recombinant(Shingrix) 01/27/2018, 03/13/2019   Zoster, Live 04/18/2008   Pertinent  Health Maintenance Due  Topic Date Due   FOOT EXAM  Never done   HEMOGLOBIN A1C  08/16/2020   OPHTHALMOLOGY EXAM  01/16/2025   Influenza Vaccine  Completed   Bone Density Scan  Completed   Mammogram  Discontinued   Colonoscopy  Discontinued      02/20/2021    1:17 PM 10/18/2021    4:29 PM 12/30/2022   10:40 AM 01/27/2024    2:10 PM 06/11/2024    2:36 PM  Fall Risk  Falls in the past year? 0  1 0 1  Was there an injury with Fall? 0   0   0   Fall Risk Category Calculator 0  2  1  Fall Risk Category (Retired) Low       (RETIRED) Patient Fall Risk Level Low fall risk  Low fall risk      Patient at Risk for Falls Due to   History of fall(s);Impaired balance/gait  History of fall(s)  Fall risk Follow up Falls evaluation completed   Education provided  Falls evaluation completed     Data saved with a previous flowsheet row definition   Functional Status Survey:    Vitals:   07/10/24 1803  BP: 104/66  Pulse: 84  Resp: 18  Temp: 97.8 F (36.6 C)  SpO2: 96%  Weight: 163 lb 3.2 oz (74 kg)   Body mass index is 24.1 kg/m. Physical Exam Vitals reviewed.  Constitutional:       General: She is not in acute distress. HENT:     Head: Normocephalic.     Right Ear: Tympanic membrane normal. There is no impacted cerumen.     Left Ear: Tympanic membrane normal. There is no impacted cerumen.     Nose: Nose normal.  Mouth/Throat:     Mouth: Mucous membranes are moist.  Eyes:     General:        Right eye: No discharge.        Left eye: No discharge.  Cardiovascular:     Rate and Rhythm: Normal rate and regular rhythm.     Pulses: Normal pulses.     Heart sounds: Normal heart sounds.  Pulmonary:     Effort: Pulmonary effort is normal.     Breath sounds: Normal breath sounds.  Abdominal:     General: Bowel sounds are normal. There is no distension.     Palpations: Abdomen is soft.     Tenderness: There is no abdominal tenderness.  Musculoskeletal:     Cervical back: Neck supple.     Right lower leg: Edema present.     Left lower leg: Edema present.     Comments: BLE non pitting  Skin:    General: Skin is warm.     Capillary Refill: Capillary refill takes less than 2 seconds.  Neurological:     General: No focal deficit present.     Mental Status: She is alert. Mental status is at baseline.     Gait: Gait abnormal.  Psychiatric:        Mood and Affect: Mood normal.     Labs reviewed: Recent Labs    12/16/23 0000 12/23/23 0000 05/11/24 0000  NA 131* 135* 136*  K 4.4 4.7 4.1  CL 97* 100 100  CO2 26* 26* 26*  BUN 18 20 13   CREATININE 0.6 0.6 0.6  CALCIUM 8.4* 8.5* 9.2   Recent Labs    08/24/23 0000  AST 17  ALT 13  ALKPHOS 64  ALBUMIN 4.4   Recent Labs    12/16/23 0000 05/11/24 0000 06/22/24 0000  WBC 3.2 3.5 4.4  NEUTROABS 1,027.00 1,806.00 2,323.00  HGB 12.1 13.8 12.0  HCT 37 41 37  PLT 165 252 227   Lab Results  Component Value Date   TSH 2.98 01/04/2023   Lab Results  Component Value Date   HGBA1C 5.6 02/14/2020   Lab Results  Component Value Date   CHOL 202 (A) 08/24/2023   HDL 105 (A) 08/24/2023   LDLCALC 75  06/12/2022   LDLDIRECT 123.5 09/18/2013   TRIG 82 08/24/2023   CHOLHDL 2 07/02/2020    Significant Diagnostic Results in last 30 days:  No results found.  Assessment/Plan 1. Weight loss (Primary) - followed by dietary - cont Medpass - cont monthly weights  2. Edema of both lower legs - recent ABI negative - non pitting, able to fit into shoes  - on sodium tablets for hyponatremia - cont leg elevation - recommend compression stockings after left toe wound healed   3. Cellulitis of toe of left foot - followed by wound  - 11/26 doxycycline  x 14 days - xray left foot negative for osteomyelitis - WBC was 4.4 - cont Prosource   4. Paroxysmal atrial fibrillation ([name]) - HR controlled without medication - cont Eliquis   5. Moderate cognitive impairment - followed by [facility] > last seen 11/11> CT head ordered> not done  -  dependent with some ADLs - weight stable - periods of anxiety> worse since admission - cont abilify, zoloft and ativan   6. Neuropathy - cont gabapentin   7. Orthostatic hypotension - cont midodrine   8. Acquired hypothyroidism - cont levothyroxine   9. Major depressive disorder, recurrent episode, moderate ([name]) - intermittent anxiety - Zoloft increased last  month - cont Abilify and ativan    10. Hyponatremia - cont sodium tablets  11. Age-related osteoporosis without current pathological fracture - cont Fosamax    Family/ staff Communication: plan discussed with nurse  Labs/tests ordered:  none

## 2024-07-12 DIAGNOSIS — G309 Alzheimer's disease, unspecified: Secondary | ICD-10-CM | POA: Diagnosis not present

## 2024-07-12 DIAGNOSIS — M6281 Muscle weakness (generalized): Secondary | ICD-10-CM | POA: Diagnosis not present

## 2024-07-12 DIAGNOSIS — E11621 Type 2 diabetes mellitus with foot ulcer: Secondary | ICD-10-CM | POA: Diagnosis not present

## 2024-07-12 DIAGNOSIS — L97522 Non-pressure chronic ulcer of other part of left foot with fat layer exposed: Secondary | ICD-10-CM | POA: Diagnosis not present

## 2024-07-13 LAB — HEMOGLOBIN A1C: Hemoglobin A1C: 5.7

## 2024-07-25 ENCOUNTER — Non-Acute Institutional Stay (SKILLED_NURSING_FACILITY): Payer: Self-pay | Admitting: Internal Medicine

## 2024-07-25 ENCOUNTER — Encounter: Payer: Self-pay | Admitting: Internal Medicine

## 2024-07-25 DIAGNOSIS — I4819 Other persistent atrial fibrillation: Secondary | ICD-10-CM | POA: Diagnosis not present

## 2024-07-25 DIAGNOSIS — F01B4 Vascular dementia, moderate, with anxiety: Secondary | ICD-10-CM

## 2024-07-25 DIAGNOSIS — F1320 Sedative, hypnotic or anxiolytic dependence, uncomplicated: Secondary | ICD-10-CM

## 2024-07-25 DIAGNOSIS — I951 Orthostatic hypotension: Secondary | ICD-10-CM

## 2024-07-25 DIAGNOSIS — E782 Mixed hyperlipidemia: Secondary | ICD-10-CM

## 2024-07-25 DIAGNOSIS — E039 Hypothyroidism, unspecified: Secondary | ICD-10-CM

## 2024-07-25 DIAGNOSIS — Z5181 Encounter for therapeutic drug level monitoring: Secondary | ICD-10-CM | POA: Diagnosis not present

## 2024-07-25 DIAGNOSIS — F339 Major depressive disorder, recurrent, unspecified: Secondary | ICD-10-CM

## 2024-07-25 NOTE — Assessment & Plan Note (Signed)
Continue with Synthroid 25 mcg daily

## 2024-07-25 NOTE — Assessment & Plan Note (Signed)
 Remains on Zoloft 50 mg daily and Abilify 5 mg daily.  Do not attempt to perform gradual dose reduction as the patient's depression with anxiety is still not controlled.

## 2024-07-25 NOTE — Assessment & Plan Note (Signed)
 She remains on Eliquis  5 mg twice daily, flecainide  100 mg twice daily.  She is not on any beta-blockers due to her chronic orthostatic hypotension.

## 2024-07-25 NOTE — Progress Notes (Signed)
 [facility] Routine Visit Progress Note    Location:  Other [place].  Nursing Home Room Number: [facility] Place of Service:  SNF (31)   Laurence Locus, DO   Patient Care Team: Laurence Locus, DO as PCP - General (Internal Medicine) Dingeldein, Elspeth, MD as Consulting Physician (Ophthalmology) Caro Harlene POUR, NP as Nurse Practitioner (Geriatric Medicine)   Extended Emergency Contact Information Primary Emergency Contact: [name],[name] Address: [address]          [place], [place] [address] [place] Mobile Phone: [phone] Relation: Relative Secondary Emergency Contact: [name] Address: [address], [place] [address] [name] Phone: [phone] Relation: Other   Goals of care: Advanced Directive information    07/25/2024   12:16 PM  Advanced Directives  Does Patient Have a Medical Advance Directive? Yes  Type of Estate Agent of [place];Living will  Does patient want to make changes to medical advance directive? No - Patient declined  Copy of Healthcare Power of Attorney in Chart? Yes - validated most recent copy scanned in chart (See row information)    CODE STATUS: Full Code   Chief Complaint  Patient presents with   Medical Management of Chronic Issues    Medical Management of Chronic Issues.      HPI: Pt is a 78 y.o. female seen today for medical management of chronic disease.  Social worker needs a updated progress note for renewal of her PASSAR.  Patient remains a full code.  She complains of not being able to walk well.  Confirmed with nursing that the patient is still involved with physical therapy..   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 Overview: Qualifier: History of By: Jenetta MD, Talia     Arrhythmia    Atrial fibrillation ([name])    paroxysmal, failed medical therapy with flecainide ,  NSVT with tikosyn   Basal cell carcinoma 12/20/2012   left proximal mandible   Bruises easily    Cerebrovascular accident (CVA) due to embolism of left middle cerebral artery ([name]) 03/04/2017   Formatting of this note might be different from the original. Last Assessment & Plan: She is currently on Eliquis , no statin She will continue to follow with neurology     CVA (cerebral vascular accident) ([name]) 12/2007   Depression    medically controlled    Dysthymic disorder 04/10/2010   Formatting of this note might be different from the original. Qualifier: Diagnosis of By: Jenetta MD, Talia Last Assessment & Plan: Well controlled. Followed by psychiatry. Qualifier: Diagnosis of By: Jenetta MD, Talia Last Assessment & Plan: Slightly worse due to grief but she is not interested in medication adjustment at this time Continue Dextrostat  and Cymbalta  She will continue to follow with psych   Fracture of carpal bone 11/12/2014   Formatting of this note might be different from the original. Last Assessment & Plan: >25 minutes spent in face to face time with patient, >50% spent in counselling or coordination of care She does seem a little altered by narcotics- she is not driving.  Appears to be taking them as prescribed but this  is likely contributing to her tearfulness and anxiety. Expressed understanding with her frustrat   Glaucoma    also with macular holes AE Dr. Dingledein   History of loop recorder    Hypothyroidism    Low blood pressure    no falls, but if gets up to fast   Malaise and fatigue 03/11/2010   Qualifier: Diagnosis of  By: Perla MD, Velinda Deal of this note might be different from the original. Overview: Qualifier: Diagnosis of By: Perla MD, Tim Last Assessment & Plan: She does have malaise and fatigue with atrial  fibrillation though otherwise has been feeling well.     Memory loss 12/31/2020   RLS (restless legs syndrome)    Stroke ([name]) 7 years ago   Lasting effects on balance, different personality.   Thyroid  disease    Tremor    Ventricular tachycardia ([name])    pt told at [facility] that she had RVOT VT   Past Surgical History:  Procedure Laterality Date   ATRIAL FIBRILLATION ABLATION  10/18/12   PVI by Dr Kelsie   ATRIAL FIBRILLATION ABLATION N/A 10/18/2012   Procedure: ATRIAL FIBRILLATION ABLATION;  Surgeon: Lynwood Kelsie, MD;  Location: [facility] CATH LAB;  Service: Cardiovascular;  Laterality: N/A;   ATRIAL FIBRILLATION ABLATION N/A 04/18/2019   Procedure: ATRIAL FIBRILLATION ABLATION;  Surgeon: Kelsie Lynwood, MD;  Location: [facility] INVASIVE CV LAB;  Service: Cardiovascular;  Laterality: N/A;   EUS N/A 10/19/2019   Procedure: FULL UPPER ENDOSCOPIC ULTRASOUND (EUS) RADIAL;  Surgeon: Queenie Asberry LABOR, MD;  Location: [facility] ENDOSCOPY;  Service: Gastroenterology;  Laterality: N/A;   EYE SURGERY     on L/R eye, macular hole    FRACTURE SURGERY     implantable loop recorder placement  07/13/2019   MDT Reveal LINQ1 [facility] [id number]) implanted in office by Dr Kelsie for afib management post ablation   OPEN REDUCTION INTERNAL FIXATION (ORIF) DISTAL RADIAL FRACTURE Right 11/15/2014   Procedure: OPEN REDUCTION INTERNAL FIXATION (ORIF) RIGHT DISTAL RADIAL FRACTURE WITH ALLOGRAFT BONE GRAFT;  Surgeon: Elsie Mussel, MD;  Location: [facility];  Service: Orthopedics;  Laterality: Right;   PANCREATECTOMY N/A 02/13/2020   Procedure: LAPAROSCOPIC DISTAL PANCREATECTOMY;  Surgeon: Aron Shoulders, MD;  Location: [facility] OR;  Service: General;  Laterality: N/A;   TEE WITHOUT CARDIOVERSION N/A 10/18/2012   Procedure: TRANSESOPHAGEAL ECHOCARDIOGRAM (TEE);  Surgeon: Redell GORMAN Shallow, MD;  Location: [facility] ENDOSCOPY;  Service: Cardiovascular;  Laterality: N/A;  Pre-Ablation at 12pm   TONSILLECTOMY     VAGINAL HYSTERECTOMY        Allergies[1]   Outpatient Encounter Medications as of 07/25/2024  Medication Sig   acetaminophen  (TYLENOL ) 500 MG tablet Take 1,000 mg by mouth 3 (three) times daily.   alendronate (FOSAMAX) 70 MG tablet Take 70 mg by mouth once a week. Take with a full glass of water  on an empty stomach.   apixaban  (ELIQUIS ) 5 MG TABS tablet Take 1 tablet (5 mg total) by mouth in the morning and at bedtime.   ARIPiprazole (ABILIFY) 5 MG tablet Take 5 mg by mouth at bedtime.   COLLAGEN-ANTIMICROBIAL EX Apply 1 Application topically daily.   Cranberry 425 MG CAPS Take 1 capsule by mouth at bedtime.   donepezil (ARICEPT) 5 MG tablet Take 5 mg by mouth at bedtime.   flecainide  (TAMBOCOR ) 100 MG tablet Take 100 mg by mouth 2 (two) times daily.   gabapentin  (NEURONTIN ) 100 MG capsule Take 100 mg by mouth 2 (two) times daily.   Levothyroxine   Sodium 25 MCG CAPS Take 1 capsule by mouth at bedtime.   LORazepam  (ATIVAN ) 0.5 MG tablet Give one tablet by mouth twice daily at 8 a and 4 p and Give 0.5 tablet by mouth at bedtime.   MAGNESIUM GLYCINATE PO Give 600mg  by mouth once daily for headaches.   melatonin 3 MG TABS tablet Take 3 mg by mouth at bedtime.   midodrine  (PROAMATINE ) 5 MG tablet Take 1 tablet (5 mg total) by mouth 3 (three) times daily with meals. Take it at 7am, 11am & 3 pm   multivitamin-iron-minerals-folic acid (THERAPEUTIC-M) TABS tablet Take 1 tablet by mouth daily.   mupirocin ointment (BACTROBAN) 2 % 1 Application 2 (two) times daily.   pravastatin  (PRAVACHOL ) 10 MG tablet Take 1 tablet (10 mg total) by mouth at bedtime. Take a whole pill for pill pack and delivery   Riboflavin 100 MG TABS Take 4 tablets by mouth daily.   sertraline (ZOLOFT) 50 MG tablet Take 50 mg by mouth every morning.   sodium chloride  1 g tablet Take 0.5 tablets by mouth in the morning and at bedtime.   timolol  (TIMOPTIC ) 0.5 % ophthalmic solution Place 1 drop into the left eye at bedtime.   UNABLE TO FIND Take 1 Dose by mouth  2 (two) times daily. Med Name: Medpass   Wheat Dextrin (BENEFIBER PO) Take 1 Scoop by mouth daily.   No facility-administered encounter medications on file as of 07/25/2024.     Review of Systems  Constitutional: Negative.   HENT: Negative.    Eyes: Negative.   Respiratory: Negative.    Cardiovascular: Negative.   Gastrointestinal: Negative.   Endocrine: Negative.   Genitourinary: Negative.   Musculoskeletal:        Having difficulty with walking.  Skin: Negative.   Allergic/Immunologic: Negative.   Neurological:  Positive for weakness.  Hematological: Negative.   Psychiatric/Behavioral: Negative.    All other systems reviewed and are negative.     Immunization History  Administered Date(s) Administered   Fluad Quad(high Dose 65+) 05/10/2019, 05/22/2020   INFLUENZA, HIGH DOSE SEASONAL PF 05/24/2014   Influenza Split 05/21/2011, 05/31/2012   Influenza Whole 06/02/2010   Influenza,inj,Quad PF,6+ Mos 04/04/2015, 05/06/2016, 05/13/2017, 05/10/2018   Influenza-Unspecified 05/20/2022, 05/21/2023, 05/16/2024   Moderna Covid-19 Fall Seasonal Vaccine 47yrs & older 11/10/2022   Moderna SARS-COV2 Booster Vaccination 12/09/2020   Moderna Sars-Covid-2 Vaccination 08/17/2019, 09/14/2019, 06/18/2020, 04/24/2021, 12/30/2021   Pneumococcal Conjugate-13 10/02/2013   Pneumococcal Polysaccharide-23 04/04/2015   RSV,unspecified 05/09/2024   Td 04/18/2008   Tdap 10/30/2015   Unspecified SARS-COV-2 Vaccination 06/12/2022, 04/30/2023, 11/12/2023   Zoster Recombinant(Shingrix) 01/27/2018, 03/13/2019   Zoster, Live 04/18/2008   Pertinent  Health Maintenance Due  Topic Date Due   FOOT EXAM  Never done   HEMOGLOBIN A1C  08/16/2020   OPHTHALMOLOGY EXAM  01/16/2025   Influenza Vaccine  Completed   Bone Density Scan  Completed   Mammogram  Discontinued   Colonoscopy  Discontinued      10/18/2021    4:29 PM 12/30/2022   10:40 AM 01/27/2024    2:10 PM 06/11/2024    2:36 PM 07/11/2024   12:07  PM  Fall Risk  Falls in the past year?  1 0 1 1  Was there an injury with Fall?  0   0  0  Fall Risk Category Calculator  2  1 1   (RETIRED) Patient Fall Risk Level Low fall risk       Patient at Risk for Falls  Due to  History of fall(s);Impaired balance/gait  History of fall(s) History of fall(s)  Fall risk Follow up  Education provided  Falls evaluation completed Falls evaluation completed     Data saved with a previous flowsheet row definition   Functional Status Survey:     Vitals:   07/25/24 1207  BP: (!) 89/55  Pulse: 91  Resp: 18  Temp: 97.6 F (36.4 C)  SpO2: 96%  Weight: 163 lb 3.2 oz (74 kg)  Height: 5' 9 (1.753 m)   Body mass index is 24.1 kg/m. Physical Exam Vitals and nursing note reviewed.  Constitutional:      General: She is not in acute distress.    Appearance: She is not toxic-appearing.  HENT:     Head: Normocephalic and atraumatic.     Nose: Nose normal.  Cardiovascular:     Rate and Rhythm: Normal rate and regular rhythm.  Pulmonary:     Effort: Pulmonary effort is normal.     Breath sounds: Normal breath sounds.  Abdominal:     General: Abdomen is flat. Bowel sounds are normal. There is no distension.     Palpations: Abdomen is soft.  Musculoskeletal:     Right lower leg: No edema.     Left lower leg: No edema.  Skin:    General: Skin is warm and dry.     Capillary Refill: Capillary refill takes less than 2 seconds.  Neurological:     Mental Status: She is alert.     Comments: Unaware of the year or month.      Labs reviewed: Recent Labs    12/16/23 0000 12/23/23 0000 05/11/24 0000  NA 131* 135* 136*  K 4.4 4.7 4.1  CL 97* 100 100  CO2 26* 26* 26*  BUN 18 20 13   CREATININE 0.6 0.6 0.6  CALCIUM 8.4* 8.5* 9.2   Recent Labs    08/24/23 0000  AST 17  ALT 13  ALKPHOS 64  ALBUMIN 4.4   Recent Labs    12/16/23 0000 05/11/24 0000 06/22/24 0000  WBC 3.2 3.5 4.4  NEUTROABS 1,027.00 1,806.00 2,323.00  HGB 12.1 13.8 12.0   HCT 37 41 37  PLT 165 252 227   Lab Results  Component Value Date   TSH 2.98 01/04/2023   Lab Results  Component Value Date   HGBA1C 5.7 07/13/2024   Lab Results  Component Value Date   CHOL 202 (A) 08/24/2023   HDL 105 (A) 08/24/2023   LDLCALC 75 06/12/2022   LDLDIRECT 123.5 09/18/2013   TRIG 82 08/24/2023   CHOLHDL 2 07/02/2020     Assessment/Plan Moderate vascular dementia with anxiety ([name]) Moderate to severe vascular dementia with anxiety.  She still able to carry on a conversation although she is not aware of the time place.  She complains that she wants to get up and walk but she knows her limitations and ability to stand.  She remains on 5 mg of Aricept at bedtime.  Benzodiazepine dependence ([name]) She remains on scheduled Ativan  0.5 mg twice daily and 0.5 mg at bedtime  Orthostatic hypotension Patient remains on midodrine  5 mg 3 times daily.  Acquired hypothyroidism Continue with Synthroid  25 mcg daily  Persistent atrial fibrillation ([name]) She remains on Eliquis  5 mg twice daily, flecainide  100 mg twice daily.  She is not on any beta-blockers due to her chronic orthostatic hypotension.  Mixed hyperlipidemia She remains on pravastatin  10 mg a day  Major depression, recurrent, chronic Remains  on Zoloft 50 mg daily and Abilify 5 mg daily.  Do not attempt to perform gradual dose reduction as the patient's depression with anxiety is still not controlled.  Encounter for medication titration 07/25/2024(4th qtr 2025). Remains on Zoloft 50 mg daily and Abilify 5 mg daily.  Do not attempt to perform gradual dose reduction as the patient's depression with anxiety is still not controlled.    There are no discontinued medications. Orders Placed This Encounter  Procedures   Hemoglobin A1c    This external order was created through the Results Console.    Camellia Door, DO [facility] [phone]      [1]  Allergies Allergen Reactions    Darvon Other (See Comments)    Severe panic attacks   Propoxyphene Other (See Comments)    GI Upset Severe panic attacks GI Upset   Propoxyphene N-Acetaminophen  Other (See Comments)    Severe panic attacks   Levofloxacin Anxiety and Other (See Comments)    Causes panic attacks.

## 2024-07-25 NOTE — Assessment & Plan Note (Signed)
 She remains on scheduled Ativan  0.5 mg twice daily and 0.5 mg at bedtime

## 2024-07-25 NOTE — Assessment & Plan Note (Signed)
 Moderate to severe vascular dementia with anxiety.  She still able to carry on a conversation although she is not aware of the time place.  She complains that she wants to get up and walk but she knows her limitations and ability to stand.  She remains on 5 mg of Aricept at bedtime.

## 2024-07-25 NOTE — Assessment & Plan Note (Signed)
 Patient remains on midodrine  5 mg 3 times daily.

## 2024-07-25 NOTE — Assessment & Plan Note (Signed)
 She remains on pravastatin  10 mg a day

## 2024-07-25 NOTE — Assessment & Plan Note (Signed)
 07/25/2024(4th qtr 2025). Remains on Zoloft 50 mg daily and Abilify 5 mg daily.  Do not attempt to perform gradual dose reduction as the patient's depression with anxiety is still not controlled.

## 2024-08-16 ENCOUNTER — Encounter: Payer: Self-pay | Admitting: Orthopedic Surgery

## 2024-08-16 ENCOUNTER — Non-Acute Institutional Stay (SKILLED_NURSING_FACILITY): Payer: Self-pay | Admitting: Orthopedic Surgery

## 2024-08-16 DIAGNOSIS — S91102D Unspecified open wound of left great toe without damage to nail, subsequent encounter: Secondary | ICD-10-CM

## 2024-08-16 DIAGNOSIS — F01B4 Vascular dementia, moderate, with anxiety: Secondary | ICD-10-CM | POA: Diagnosis not present

## 2024-08-16 NOTE — Progress Notes (Signed)
 " Location:  Other [facility]) Nursing Home Room Number: [facility] A Place of Service:  SNF [phone]) Provider:  Gil Greig FORBES CARROLYN Monica Camellia, DO  Patient Care Team: Monica Camellia, DO as PCP - General (Internal Medicine) Dingeldein, Elspeth, MD as Consulting Physician (Ophthalmology) Caro Harlene POUR, NP as Nurse Practitioner (Geriatric Medicine)  Extended Emergency Contact Information Primary Emergency Contact: [name],[name] Address: [address]          [place], [place] [address] [place] Mobile Phone: [phone] Relation: Relative Secondary Emergency Contact: [name] Address: [address], [place] [address] [name] Phone: [phone] Relation: Other  Code Status:  DNR Goals of care: Advanced Directive information    08/16/2024   11:37 AM  Advanced Directives  Does Patient Have a Medical Advance Directive? Yes  Type of Estate Agent of [place];Living will  Does patient want to make changes to medical advance directive? No - Patient declined  Copy of Healthcare Power of Attorney in Chart? Yes - validated most recent copy scanned in chart (See row information)     Chief Complaint  Patient presents with   Toe Redness    HPI:  Pt is a 79 y.o. female seen today for acute visit due to left toe wound.   She currently resides on the skilled nursing unit at [facility]. PMH: CVA, PAF, T2DM, HLD, hypothyroidism, h/o hysterectomy, h/o ORIF right distal radius 2016, osteoporosis, basal cell carcinoma 2014, ADHD, benzodiazepine dependence, memory loss and depression.   Left great toe wound/cellulitis- followed by wound care, 11/26 started on doxycycline  x 14 days, WBC was 4.4 06/22/2024, xray left foot negative for osteomyelitis> soft tissue swelling 06/20/2024, remains on Prosource Plus   Increased purulent drainage x 2 days. Nursing has been applying Iodosorb gel. Wound care coming  tomorrow.   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 Overview: Qualifier: History of By: Jenetta MD, Talia     Arrhythmia    Atrial fibrillation ([name])    paroxysmal, failed medical therapy with flecainide ,  NSVT with tikosyn   Basal cell carcinoma 12/20/2012   left proximal mandible   Bruises easily    Cerebrovascular accident (CVA) due to embolism of left middle cerebral artery ([name]) 03/04/2017   Formatting of this note might be different from the original. Last Assessment & Plan: She is currently on Eliquis , no statin She will continue to follow with neurology     CVA (cerebral vascular accident) ([name]) 12/2007   Depression    medically controlled    Dysthymic disorder 04/10/2010   Formatting of this note might be different from the original. Qualifier: Diagnosis of By: Jenetta MD, Talia Last Assessment & Plan: Well controlled. Followed by psychiatry. Qualifier: Diagnosis of By: Jenetta MD, Talia Last Assessment & Plan: Slightly worse due to grief but she is not interested in medication adjustment at this time Continue Dextrostat  and Cymbalta  She will continue to follow  with psych   Fracture of carpal bone 11/12/2014   Formatting of this note might be different from the original. Last Assessment & Plan: >25 minutes spent in face to face time with patient, >50% spent in counselling or coordination of care She does seem a little altered by narcotics- she is not driving.  Appears to be taking them as prescribed but this is likely contributing  to her tearfulness and anxiety. Expressed understanding with her frustrat   Glaucoma    also with macular holes AE Dr. Dingledein   History of loop recorder    Hypothyroidism    Low blood pressure    no falls, but if gets up to fast   Malaise and fatigue 03/11/2010   Qualifier: Diagnosis of  By: Monica Whitaker Whitaker of this note might be different from the original. Overview: Qualifier: Diagnosis of By: Perla MD, Tim Last Assessment & Plan: She does have malaise and fatigue with atrial fibrillation though otherwise has been feeling well.     Memory loss 12/31/2020   RLS (restless legs syndrome)    Stroke ([name]) 7 years ago   Lasting effects on balance, different personality.   Thyroid  disease    Tremor    Ventricular tachycardia ([name])    pt told at [facility] that she had RVOT VT   Past Surgical History:  Procedure Laterality Date   ATRIAL FIBRILLATION ABLATION  10/18/12   PVI by Dr Kelsie   ATRIAL FIBRILLATION ABLATION N/A 10/18/2012   Procedure: ATRIAL FIBRILLATION ABLATION;  Surgeon: Lynwood Kelsie, MD;  Location: [facility] CATH LAB;  Service: Cardiovascular;  Laterality: N/A;   ATRIAL FIBRILLATION ABLATION N/A 04/18/2019   Procedure: ATRIAL FIBRILLATION ABLATION;  Surgeon: Kelsie Lynwood, MD;  Location: [facility] INVASIVE CV LAB;  Service: Cardiovascular;  Laterality: N/A;   EUS N/A 10/19/2019   Procedure: FULL UPPER ENDOSCOPIC ULTRASOUND (EUS) RADIAL;  Surgeon: Queenie Asberry LABOR, MD;  Location: [facility] ENDOSCOPY;  Service: Gastroenterology;  Laterality: N/A;   EYE SURGERY     on L/R eye, macular hole    FRACTURE SURGERY     implantable loop recorder placement  07/13/2019   MDT Reveal LINQ1 [facility] [id number]) implanted in office by Dr Kelsie for afib management post ablation   OPEN REDUCTION INTERNAL FIXATION (ORIF) DISTAL RADIAL FRACTURE Right 11/15/2014   Procedure: OPEN REDUCTION INTERNAL FIXATION (ORIF) RIGHT DISTAL RADIAL FRACTURE WITH ALLOGRAFT BONE GRAFT;  Surgeon: Elsie Mussel, MD;  Location:  [facility];  Service: Orthopedics;  Laterality: Right;   PANCREATECTOMY N/A 02/13/2020   Procedure: LAPAROSCOPIC DISTAL PANCREATECTOMY;  Surgeon: Aron Shoulders, MD;  Location: [facility] OR;  Service: General;  Laterality: N/A;   TEE WITHOUT CARDIOVERSION N/A 10/18/2012   Procedure: TRANSESOPHAGEAL ECHOCARDIOGRAM (TEE);  Surgeon: Redell GORMAN Shallow, MD;  Location: [facility] ENDOSCOPY;  Service: Cardiovascular;  Laterality: N/A;  Pre-Ablation at 12pm   TONSILLECTOMY     VAGINAL HYSTERECTOMY      Allergies[1]  Outpatient Encounter Medications as of 08/16/2024  Medication Sig   acetaminophen  (TYLENOL ) 500 MG tablet Take 1,000 mg by mouth 3 (three) times daily.   alendronate (FOSAMAX) 70 MG tablet Take 70 mg by mouth once a week. Take with a full glass of water  on an empty stomach.   apixaban  (ELIQUIS ) 5 MG TABS tablet Take 1 tablet (5 mg total) by mouth in the morning and at bedtime.   ARIPiprazole (ABILIFY) 5 MG tablet Take 5 mg by mouth at bedtime.   cadexomer iodine (IODOSORB) 0.9 % gel  Apply 1 Application topically daily as needed for wound care.   Cranberry 425 MG CAPS Take 1 capsule by mouth at bedtime.   donepezil (ARICEPT) 5 MG tablet Take 5 mg by mouth at bedtime.   flecainide  (TAMBOCOR ) 100 MG tablet Take 100 mg by mouth 2 (two) times daily.   gabapentin  (NEURONTIN ) 100 MG capsule Take 100 mg by mouth 2 (two) times daily.   Levothyroxine  Sodium 25 MCG CAPS Take 1 capsule by mouth at bedtime.   LORazepam  (ATIVAN ) 0.5 MG tablet Give one tablet by mouth twice daily at 8 a and 4 p and Give 0.5 tablet by mouth at bedtime.   MAGNESIUM GLYCINATE PO Give 600mg  by mouth once daily for headaches.   melatonin 3 MG TABS tablet Take 3 mg by mouth at bedtime.   midodrine  (PROAMATINE ) 5 MG tablet Take 1 tablet (5 mg total) by mouth 3 (three) times daily with meals. Take it at 7am, 11am & 3 pm   multivitamin-iron-minerals-folic acid (THERAPEUTIC-M) TABS tablet Take 1 tablet by mouth daily.    pravastatin  (PRAVACHOL ) 10 MG tablet Take 1 tablet (10 mg total) by mouth at bedtime. Take a whole pill for pill pack and delivery   Riboflavin 100 MG TABS Take 4 tablets by mouth daily.   sertraline (ZOLOFT) 50 MG tablet Take 50 mg by mouth every morning.   sodium chloride  1 g tablet Take 0.5 tablets by mouth in the morning and at bedtime.   timolol  (TIMOPTIC ) 0.5 % ophthalmic solution Place 1 drop into the left eye at bedtime.   UNABLE TO FIND Take 1 Dose by mouth 2 (two) times daily. Med Name: Medpass   Wheat Dextrin (BENEFIBER PO) Take 1 Scoop by mouth daily.   COLLAGEN-ANTIMICROBIAL EX Apply 1 Application topically daily. (Patient not taking: Reported on 08/16/2024)   mupirocin ointment (BACTROBAN) 2 % 1 Application 2 (two) times daily. (Patient not taking: Reported on 08/16/2024)   No facility-administered encounter medications on file as of 08/16/2024.    Review of Systems  Unable to perform ROS: Dementia    Immunization History  Administered Date(s) Administered   Fluad Quad(high Dose 65+) 05/10/2019, 05/22/2020   INFLUENZA, HIGH DOSE SEASONAL PF 05/24/2014   Influenza Split 05/21/2011, 05/31/2012   Influenza Whole 06/02/2010   Influenza,inj,Quad PF,6+ Mos 04/04/2015, 05/06/2016, 05/13/2017, 05/10/2018   Influenza-Unspecified 05/20/2022, 05/21/2023, 05/16/2024   Moderna Covid-19 Fall Seasonal Vaccine 66yrs & older 11/10/2022   Moderna SARS-COV2 Booster Vaccination 12/09/2020   Moderna Sars-Covid-2 Vaccination 08/17/2019, 09/14/2019, 06/18/2020, 04/24/2021, 12/30/2021   Pneumococcal Conjugate-13 10/02/2013   Pneumococcal Polysaccharide-23 04/04/2015   RSV,unspecified 05/09/2024   Td 04/18/2008   Tdap 10/30/2015   Unspecified SARS-COV-2 Vaccination 06/12/2022, 04/30/2023, 11/12/2023   Zoster Recombinant(Shingrix) 01/27/2018, 03/13/2019   Zoster, Live 04/18/2008   Pertinent  Health Maintenance Due  Topic Date Due   FOOT EXAM  Never done   HEMOGLOBIN A1C  01/11/2025    OPHTHALMOLOGY EXAM  01/16/2025   Influenza Vaccine  Completed   Bone Density Scan  Completed   Mammogram  Discontinued   Colonoscopy  Discontinued      10/18/2021    4:29 PM 12/30/2022   10:40 AM 01/27/2024    2:10 PM 06/11/2024    2:36 PM 07/11/2024   12:07 PM  Fall Risk  Falls in the past year?  1 0 1 1  Was there an injury with Fall?  0   0  0  Fall Risk Category Calculator  2  1 1   (  RETIRED) Patient Fall Risk Level Low fall risk       Patient at Risk for Falls Due to  History of fall(s);Impaired balance/gait  History of fall(s) History of fall(s)  Fall risk Follow up  Education provided  Falls evaluation completed Falls evaluation completed     Data saved with a previous flowsheet row definition   Functional Status Survey:    Vitals:   08/16/24 1135  BP: 106/70  Pulse: 80  Resp: 20  Temp: 97.6 F (36.4 C)  SpO2: 98%  Weight: 162 lb 12.8 oz (73.8 kg)  Height: 5' 9 (1.753 m)   Body mass index is 24.04 kg/m. Physical Exam Vitals reviewed.  Constitutional:      General: She is not in acute distress. HENT:     Head: Normocephalic.  Eyes:     General:        Right eye: No discharge.        Left eye: No discharge.  Cardiovascular:     Rate and Rhythm: Normal rate and regular rhythm.     Pulses: Normal pulses.     Heart sounds: Normal heart sounds.  Pulmonary:     Effort: Pulmonary effort is normal.     Breath sounds: Normal breath sounds.  Abdominal:     General: Bowel sounds are normal. There is no distension.     Palpations: Abdomen is soft.     Tenderness: There is no abdominal tenderness.  Musculoskeletal:     Cervical back: Neck supple.     Right lower leg: No edema.     Left lower leg: No edema.  Skin:    General: Skin is warm.     Capillary Refill: Capillary refill takes less than 2 seconds.     Comments: Approx 0.5 x 0.5 cm open wound, no granulation tissue, purulent drainage on bandage, no erythema or swelling, non tender  Neurological:      General: No focal deficit present.     Mental Status: She is alert. Mental status is at baseline.     Gait: Gait abnormal.  Psychiatric:        Mood and Affect: Mood normal.     Labs reviewed: Recent Labs    12/16/23 0000 12/23/23 0000 05/11/24 0000  NA 131* 135* 136*  K 4.4 4.7 4.1  CL 97* 100 100  CO2 26* 26* 26*  BUN 18 20 13   CREATININE 0.6 0.6 0.6  CALCIUM 8.4* 8.5* 9.2   Recent Labs    08/24/23 0000  AST 17  ALT 13  ALKPHOS 64  ALBUMIN 4.4   Recent Labs    12/16/23 0000 05/11/24 0000 06/22/24 0000  WBC 3.2 3.5 4.4  NEUTROABS 1,027.00 1,806.00 2,323.00  HGB 12.1 13.8 12.0  HCT 37 41 37  PLT 165 252 227   Lab Results  Component Value Date   TSH 2.98 01/04/2023   Lab Results  Component Value Date   HGBA1C 5.7 07/13/2024   Lab Results  Component Value Date   CHOL 202 (A) 08/24/2023   HDL 105 (A) 08/24/2023   LDLCALC 75 06/12/2022   LDLDIRECT 123.5 09/18/2013   TRIG 82 08/24/2023   CHOLHDL 2 07/02/2020    Significant Diagnostic Results in last 30 days:  No results found.  Assessment/Plan 1. Open wound of left great toe, subsequent encounter (Primary) - followed by wound> scheduled 01/15 - 11/26 doxycycline  x 14 days> completed  - purulent drainage today, no granulation tissue - wound culture  2. Moderate vascular dementia with anxiety ([name]) - followed by [facility] > last seen 11/11> CT head ordered> not done  - no behaviors  -  dependent with some ADLs - weight stable - periods of anxiety> worse since admission - cont abilify, zoloft and ativan > GDR not recommended       Family/ staff Communication: plan discussed with patient and nurse  Labs/tests ordered:  wound culture      [1]  Allergies Allergen Reactions   Darvon Other (See Comments)    Severe panic attacks   Propoxyphene Other (See Comments)    GI Upset Severe panic attacks GI Upset   Propoxyphene N-Acetaminophen  Other (See Comments)    Severe panic attacks    Levofloxacin Anxiety and Other (See Comments)    Causes panic attacks.   "

## 2024-08-24 ENCOUNTER — Encounter: Payer: Self-pay | Admitting: Nurse Practitioner

## 2024-08-24 ENCOUNTER — Non-Acute Institutional Stay (SKILLED_NURSING_FACILITY): Payer: Self-pay | Admitting: Nurse Practitioner

## 2024-08-24 DIAGNOSIS — M8000XP Age-related osteoporosis with current pathological fracture, unspecified site, subsequent encounter for fracture with malunion: Secondary | ICD-10-CM | POA: Diagnosis not present

## 2024-08-24 DIAGNOSIS — F331 Major depressive disorder, recurrent, moderate: Secondary | ICD-10-CM | POA: Diagnosis not present

## 2024-08-24 DIAGNOSIS — I951 Orthostatic hypotension: Secondary | ICD-10-CM

## 2024-08-24 DIAGNOSIS — F01B4 Vascular dementia, moderate, with anxiety: Secondary | ICD-10-CM | POA: Diagnosis not present

## 2024-08-24 DIAGNOSIS — F1320 Sedative, hypnotic or anxiolytic dependence, uncomplicated: Secondary | ICD-10-CM | POA: Diagnosis not present

## 2024-08-24 DIAGNOSIS — I4819 Other persistent atrial fibrillation: Secondary | ICD-10-CM | POA: Diagnosis not present

## 2024-08-24 DIAGNOSIS — E0849 Diabetes mellitus due to underlying condition with other diabetic neurological complication: Secondary | ICD-10-CM

## 2024-08-24 DIAGNOSIS — E039 Hypothyroidism, unspecified: Secondary | ICD-10-CM | POA: Diagnosis not present

## 2024-08-24 DIAGNOSIS — E782 Mixed hyperlipidemia: Secondary | ICD-10-CM

## 2024-08-24 NOTE — Progress Notes (Signed)
 " Location:  Other [place].  Nursing Home Room Number: [facility] Place of Service:  SNF [phone]) Harlene An, NP  PCP: Laurence Locus, DO  Patient Care Team: Laurence Locus, DO as PCP - General (Internal Medicine) Dingeldein, Elspeth, MD as Consulting Physician (Ophthalmology) An Harlene POUR, NP as Nurse Practitioner (Geriatric Medicine)  Extended Emergency Contact Information Primary Emergency Contact: [name],[name] Address: [address]          [place], [place] [address] [place] Mobile Phone: [phone] Relation: Relative Secondary Emergency Contact: [name] Address: [address] [place], [place] [address] [name] Phone: [phone] Relation: Other  Goals of care: Advanced Directive information    08/16/2024   11:37 AM  Advanced Directives  Does Patient Have a Medical Advance Directive? Yes  Type of Estate Agent of [place];Living will  Does patient want to make changes to medical advance directive? No - Patient declined  Copy of Healthcare Power of Attorney in Chart? Yes - validated most recent copy scanned in chart (See row information)     Chief Complaint  Patient presents with   Medical Management of Chronic Issues    Medical Management of Chronic Issues.     HPI:  Pt is a 79 y.o. female,  seen today for medical management of chronic disease. Pt with dementia, hypothyroid, hyperlipidemia, anxiety.  She is followed by the wound care doctor due to her DTI and open wound to toe- area is healing well per wound care- no signs for infection.  Staff reports she continues to be  pleasantly confused and anxiety remains.   Her weight remains stable- she continues on medpass.   She was previously on prolia  for osteoporosis then transitioned to actonel in Feb 2024 and now on fosamax weekly  She denies pain at this time  No worsening LE edema  No shortness of breath reported.     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 Overview: Qualifier: History of By: Jenetta MD, Talia     Arrhythmia    Atrial fibrillation ([name])    paroxysmal, failed medical therapy with flecainide ,  NSVT with tikosyn   Basal cell carcinoma 12/20/2012   left proximal mandible   Bruises easily    Cerebrovascular accident (CVA) due to embolism of left middle cerebral artery ([name]) 03/04/2017   Formatting of this note might be different from the original. Last Assessment & Plan: She is currently on Eliquis , no statin She will continue to follow with neurology     CVA (cerebral vascular accident) ([name]) 12/2007   Depression    medically controlled    Dysthymic disorder 04/10/2010   Formatting of this note might be different from the original. Qualifier: Diagnosis of By: Jenetta MD, Talia Last Assessment & Plan: Well controlled. Followed by psychiatry. Qualifier: Diagnosis of By: Jenetta MD, Talia Last Assessment & Plan: Slightly worse due to grief but she is not interested in medication adjustment at this time Continue Dextrostat  and Cymbalta  She will continue to follow with psych   Fracture of carpal bone 11/12/2014   Formatting of this note might be different from the original. Last  Assessment & Plan: >25 minutes spent in face to face time with patient, >50% spent in counselling or coordination of care She does seem a little altered by narcotics- she is not driving.  Appears to be taking them as prescribed but this is likely contributing to her tearfulness and anxiety. Expressed understanding with her frustrat   Glaucoma    also with macular holes AE Dr. Dingledein   History of loop recorder    Hypothyroidism    Low blood pressure    no falls, but if gets up to fast    Malaise and fatigue 03/11/2010   Qualifier: Diagnosis of  By: Perla MD, Velinda Deal of this note might be different from the original. Overview: Qualifier: Diagnosis of By: Perla MD, Tim Last Assessment & Plan: She does have malaise and fatigue with atrial fibrillation though otherwise has been feeling well.     Memory loss 12/31/2020   RLS (restless legs syndrome)    Stroke ([name]) 7 years ago   Lasting effects on balance, different personality.   Thyroid  disease    Tremor    Ventricular tachycardia ([name])    pt told at [facility] that she had RVOT VT   Past Surgical History:  Procedure Laterality Date   ATRIAL FIBRILLATION ABLATION  10/18/12   PVI by Dr Kelsie   ATRIAL FIBRILLATION ABLATION N/A 10/18/2012   Procedure: ATRIAL FIBRILLATION ABLATION;  Surgeon: Lynwood Kelsie, MD;  Location: [facility] CATH LAB;  Service: Cardiovascular;  Laterality: N/A;   ATRIAL FIBRILLATION ABLATION N/A 04/18/2019   Procedure: ATRIAL FIBRILLATION ABLATION;  Surgeon: Kelsie Lynwood, MD;  Location: [facility] INVASIVE CV LAB;  Service: Cardiovascular;  Laterality: N/A;   EUS N/A 10/19/2019   Procedure: FULL UPPER ENDOSCOPIC ULTRASOUND (EUS) RADIAL;  Surgeon: Queenie Asberry LABOR, MD;  Location: [facility] ENDOSCOPY;  Service: Gastroenterology;  Laterality: N/A;   EYE SURGERY     on L/R eye, macular hole    FRACTURE SURGERY     implantable loop recorder placement  07/13/2019   MDT Reveal LINQ1 [facility] [id number]) implanted in office by Dr Kelsie for afib management post ablation   OPEN REDUCTION INTERNAL FIXATION (ORIF) DISTAL RADIAL FRACTURE Right 11/15/2014   Procedure: OPEN REDUCTION INTERNAL FIXATION (ORIF) RIGHT DISTAL RADIAL FRACTURE WITH ALLOGRAFT BONE GRAFT;  Surgeon: Elsie Mussel, MD;  Location: [facility];  Service: Orthopedics;  Laterality: Right;   PANCREATECTOMY N/A 02/13/2020   Procedure: LAPAROSCOPIC DISTAL PANCREATECTOMY;  Surgeon: Aron Shoulders, MD;  Location: [facility] OR;  Service: General;  Laterality: N/A;    TEE WITHOUT CARDIOVERSION N/A 10/18/2012   Procedure: TRANSESOPHAGEAL ECHOCARDIOGRAM (TEE);  Surgeon: Redell GORMAN Shallow, MD;  Location: [facility] ENDOSCOPY;  Service: Cardiovascular;  Laterality: N/A;  Pre-Ablation at 12pm   TONSILLECTOMY     VAGINAL HYSTERECTOMY      Allergies[1]  Outpatient Encounter Medications as of 08/24/2024  Medication Sig   acetaminophen  (TYLENOL ) 500 MG tablet Take 1,000 mg by mouth 3 (three) times daily.   alendronate (FOSAMAX) 70 MG tablet Take 70 mg by mouth once a week. Take with a full glass of water  on an empty stomach.   apixaban  (ELIQUIS ) 5 MG TABS tablet Take 1 tablet (5 mg total) by mouth in the morning and at bedtime.   ARIPiprazole (ABILIFY) 5 MG tablet Take 5 mg by mouth at bedtime.   cadexomer iodine (IODOSORB) 0.9 % gel Apply 1 Application topically daily as needed for wound care.   Cranberry 425 MG CAPS Take 1 capsule by mouth at  bedtime.   donepezil (ARICEPT) 5 MG tablet Take 5 mg by mouth at bedtime.   flecainide  (TAMBOCOR ) 100 MG tablet Take 100 mg by mouth 2 (two) times daily.   gabapentin  (NEURONTIN ) 100 MG capsule Take 100 mg by mouth 2 (two) times daily.   Levothyroxine  Sodium 25 MCG CAPS Take 1 capsule by mouth at bedtime.   LORazepam  (ATIVAN ) 0.5 MG tablet Give one tablet by mouth twice daily at 8 a and 4 p and Give 0.5 tablet by mouth at bedtime.   MAGNESIUM GLYCINATE PO Give 600mg  by mouth once daily for headaches.   melatonin 3 MG TABS tablet Take 3 mg by mouth at bedtime.   midodrine  (PROAMATINE ) 5 MG tablet Take 1 tablet (5 mg total) by mouth 3 (three) times daily with meals. Take it at 7am, 11am & 3 pm   multivitamin-iron-minerals-folic acid (THERAPEUTIC-M) TABS tablet Take 1 tablet by mouth daily.   pravastatin  (PRAVACHOL ) 10 MG tablet Take 1 tablet (10 mg total) by mouth at bedtime. Take a whole pill for pill pack and delivery   Riboflavin 100 MG TABS Take 4 tablets by mouth daily.   sertraline (ZOLOFT) 50 MG tablet Take 50 mg by mouth every  morning.   sodium chloride  1 g tablet Take 0.5 tablets by mouth in the morning and at bedtime.   timolol  (TIMOPTIC ) 0.5 % ophthalmic solution Place 1 drop into the left eye at bedtime.   UNABLE TO FIND Take 1 Dose by mouth 2 (two) times daily. Med Name: Medpass   Wheat Dextrin (BENEFIBER PO) Take 1 Scoop by mouth daily.   COLLAGEN-ANTIMICROBIAL EX Apply 1 Application topically daily. (Patient not taking: Reported on 08/24/2024)   No facility-administered encounter medications on file as of 08/24/2024.    Review of Systems  Constitutional:  Negative for activity change, appetite change, fatigue and unexpected weight change.  HENT:  Negative for congestion and hearing loss.   Eyes: Negative.   Respiratory:  Negative for cough and shortness of breath.   Cardiovascular:  Negative for chest pain, palpitations and leg swelling.  Gastrointestinal:  Negative for abdominal pain, constipation and diarrhea.  Genitourinary:  Negative for difficulty urinating and dysuria.  Musculoskeletal:  Negative for arthralgias and myalgias.  Skin:  Negative for color change and wound.  Neurological:  Negative for dizziness and weakness.  Psychiatric/Behavioral:  Negative for agitation, behavioral problems and confusion.      Immunization History  Administered Date(s) Administered   Fluad Quad(high Dose 65+) 05/10/2019, 05/22/2020   INFLUENZA, HIGH DOSE SEASONAL PF 05/24/2014   Influenza Split 05/21/2011, 05/31/2012   Influenza Whole 06/02/2010   Influenza,inj,Quad PF,6+ Mos 04/04/2015, 05/06/2016, 05/13/2017, 05/10/2018   Influenza-Unspecified 05/20/2022, 05/21/2023, 05/16/2024   Moderna Covid-19 Fall Seasonal Vaccine 60yrs & older 11/10/2022   Moderna SARS-COV2 Booster Vaccination 12/09/2020   Moderna Sars-Covid-2 Vaccination 08/17/2019, 09/14/2019, 06/18/2020, 04/24/2021, 12/30/2021   Pneumococcal Conjugate-13 10/02/2013   Pneumococcal Polysaccharide-23 04/04/2015   RSV,unspecified 05/09/2024   Td  04/18/2008   Tdap 10/30/2015   Unspecified SARS-COV-2 Vaccination 06/12/2022, 04/30/2023, 11/12/2023   Zoster Recombinant(Shingrix) 01/27/2018, 03/13/2019   Zoster, Live 04/18/2008   Pertinent  Health Maintenance Due  Topic Date Due   FOOT EXAM  Never done   HEMOGLOBIN A1C  01/11/2025   OPHTHALMOLOGY EXAM  01/16/2025   Influenza Vaccine  Completed   Bone Density Scan  Completed   Mammogram  Discontinued   Colonoscopy  Discontinued      10/18/2021    4:29 PM 12/30/2022  10:40 AM 01/27/2024    2:10 PM 06/11/2024    2:36 PM 07/11/2024   12:07 PM  Fall Risk  Falls in the past year?  1 0 1 1  Was there an injury with Fall?  0   0  0  Fall Risk Category Calculator  2  1 1   (RETIRED) Patient Fall Risk Level Low fall risk       Patient at Risk for Falls Due to  History of fall(s);Impaired balance/gait  History of fall(s) History of fall(s)  Fall risk Follow up  Education provided  Falls evaluation completed Falls evaluation completed     Data saved with a previous flowsheet row definition   Functional Status Survey:    Vitals:   08/24/24 1221  BP: 104/64  Pulse: 75  Resp: 19  Temp: 97.8 F (36.6 C)  SpO2: 98%  Weight: 162 lb 12.8 oz (73.8 kg)  Height: 5' 9 (1.753 m)   Body mass index is 24.04 kg/m. This SmartLink has not been configured with any valid records.   Wt Readings from Last 3 Encounters:  08/24/24 162 lb 12.8 oz (73.8 kg)  08/16/24 162 lb 12.8 oz (73.8 kg)  07/25/24 163 lb 3.2 oz (74 kg)    Physical Exam Constitutional:      General: She is not in acute distress.    Appearance: She is well-developed. She is not diaphoretic.  HENT:     Head: Normocephalic and atraumatic.     Mouth/Throat:     Pharynx: No oropharyngeal exudate.  Eyes:     Conjunctiva/sclera: Conjunctivae normal.     Pupils: Pupils are equal, round, and reactive to light.  Cardiovascular:     Rate and Rhythm: Normal rate and regular rhythm.     Heart sounds: Normal heart sounds.   Pulmonary:     Effort: Pulmonary effort is normal.     Breath sounds: Normal breath sounds.  Abdominal:     General: Bowel sounds are normal.     Palpations: Abdomen is soft.  Musculoskeletal:     Cervical back: Normal range of motion and neck supple.     Right lower leg: No edema.     Left lower leg: No edema.  Skin:    General: Skin is warm and dry.  Neurological:     Mental Status: She is alert.  Psychiatric:        Mood and Affect: Mood normal.     Labs reviewed: Recent Labs    12/16/23 0000 12/23/23 0000 05/11/24 0000  NA 131* 135* 136*  K 4.4 4.7 4.1  CL 97* 100 100  CO2 26* 26* 26*  BUN 18 20 13   CREATININE 0.6 0.6 0.6  CALCIUM 8.4* 8.5* 9.2   No results for input(s): AST, ALT, ALKPHOS, BILITOT, PROT, ALBUMIN in the last 8760 hours. Recent Labs    12/16/23 0000 05/11/24 0000 06/22/24 0000  WBC 3.2 3.5 4.4  NEUTROABS 1,027.00 1,806.00 2,323.00  HGB 12.1 13.8 12.0  HCT 37 41 37  PLT 165 252 227   Lab Results  Component Value Date   TSH 2.98 01/04/2023   Lab Results  Component Value Date   HGBA1C 5.7 07/13/2024   Lab Results  Component Value Date   CHOL 202 (A) 08/24/2023   HDL 105 (A) 08/24/2023   LDLCALC 75 06/12/2022   LDLDIRECT 123.5 09/18/2013   TRIG 82 08/24/2023   CHOLHDL 2 07/02/2020    Significant Diagnostic Results in last 30 days:  No results found.  Assessment/Plan Persistent atrial fibrillation ([name]) Rate controlled, she remains on Eliquis  5 mg twice daily, flecainide  100 mg twice daily.  Osteoporosis Continues on cal, vit d and fosamax  Orthostatic hypotension Stable, Patient remains on midodrine  5 mg 3 times daily.  Mood disorder Continue duloxetine , bupropion, lorazepam  and gabapentin   Moderate vascular dementia with anxiety ([name]) Moderate to severe vascular dementia with anxiety. no acute changes in cognitive or functional status, continue supportive care by staff.  Continues on aricept. She is  followed by neurology and has follow up Ct head scheduled.    Mixed hyperlipidemia Stable,  remains on pravastatin  10 mg a day  Major depressive disorder, recurrent episode, moderate ([name]) Remains on Zoloft 50 mg daily and Abilify 5 mg daily. Would not attempt to perform gradual dose reduction as the patient's depression with anxiety is still very active.    Diabetes due to undrl condition w oth diabetic neuro comp ([name]) Diet controlled. A1c at goal.   Benzodiazepine dependence ([name]) She remains on scheduled Ativan  0.5 mg twice daily and 0.5 mg at bedtime due to ongoing anxiety. Continue and monitor  Acquired hypothyroidism Continue with Synthroid  25 mcg daily, will update with next labs.      Calin Fantroy K. Caro BODILY [facility] [phone]       [1]  Allergies Allergen Reactions   Darvon Other (See Comments)    Severe panic attacks   Propoxyphene Other (See Comments)    GI Upset Severe panic attacks GI Upset   Propoxyphene N-Acetaminophen  Other (See Comments)    Severe panic attacks   Levofloxacin Anxiety and Other (See Comments)    Causes panic attacks.   "

## 2024-08-25 ENCOUNTER — Ambulatory Visit
Admission: RE | Admit: 2024-08-25 | Discharge: 2024-08-25 | Disposition: A | Source: Ambulatory Visit | Attending: Physician Assistant | Admitting: Physician Assistant

## 2024-08-25 DIAGNOSIS — Z8673 Personal history of transient ischemic attack (TIA), and cerebral infarction without residual deficits: Secondary | ICD-10-CM

## 2024-08-25 DIAGNOSIS — R2689 Other abnormalities of gait and mobility: Secondary | ICD-10-CM

## 2024-08-25 DIAGNOSIS — R413 Other amnesia: Secondary | ICD-10-CM

## 2024-08-27 NOTE — Assessment & Plan Note (Signed)
 She remains on scheduled Ativan  0.5 mg twice daily and 0.5 mg at bedtime due to ongoing anxiety. Continue and monitor

## 2024-08-27 NOTE — Assessment & Plan Note (Signed)
 Stable, Patient remains on midodrine  5 mg 3 times daily.

## 2024-08-27 NOTE — Assessment & Plan Note (Signed)
 Moderate to severe vascular dementia with anxiety. no acute changes in cognitive or functional status, continue supportive care by staff.  Continues on aricept. She is followed by neurology and has follow up Ct head scheduled.

## 2024-08-27 NOTE — Assessment & Plan Note (Signed)
 Continue with Synthroid  25 mcg daily, will update with next labs.

## 2024-08-27 NOTE — Assessment & Plan Note (Signed)
 Rate controlled, she remains on Eliquis  5 mg twice daily, flecainide  100 mg twice daily.

## 2024-08-27 NOTE — Assessment & Plan Note (Signed)
Continue duloxetine, bupropion, lorazepam and gabapentin

## 2024-08-27 NOTE — Assessment & Plan Note (Addendum)
 Remains on Zoloft 50 mg daily and Abilify 5 mg daily. Would not attempt to perform gradual dose reduction as the patient's depression with anxiety is still very active.

## 2024-08-27 NOTE — Assessment & Plan Note (Signed)
 Continues on cal, vit d and fosamax

## 2024-08-27 NOTE — Assessment & Plan Note (Signed)
Diet-controlled A1c at goal 

## 2024-08-27 NOTE — Assessment & Plan Note (Signed)
 Stable,  remains on pravastatin  10 mg a day
